# Patient Record
Sex: Female | Born: 1935
Health system: Southern US, Community
[De-identification: ages and names within clinical notes are randomized; demographics above are authoritative.]

## PROBLEM LIST (undated history)

## (undated) DIAGNOSIS — M1990 Unspecified osteoarthritis, unspecified site: Secondary | ICD-10-CM

## (undated) DIAGNOSIS — D649 Anemia, unspecified: Secondary | ICD-10-CM

## (undated) DIAGNOSIS — I10 Essential (primary) hypertension: Secondary | ICD-10-CM

## (undated) DIAGNOSIS — N186 End stage renal disease: Secondary | ICD-10-CM

## (undated) DIAGNOSIS — E785 Hyperlipidemia, unspecified: Secondary | ICD-10-CM

## (undated) DIAGNOSIS — J189 Pneumonia, unspecified organism: Secondary | ICD-10-CM

## (undated) DIAGNOSIS — Z992 Dependence on renal dialysis: Secondary | ICD-10-CM

## (undated) DIAGNOSIS — Z89512 Acquired absence of left leg below knee: Secondary | ICD-10-CM

## (undated) HISTORY — DX: Hyperlipidemia, unspecified: E78.5

## (undated) HISTORY — DX: Anemia, unspecified: D64.9

## (undated) HISTORY — PX: TONSILLECTOMY: SUR1361

## (undated) HISTORY — DX: Essential (primary) hypertension: I10

## (undated) HISTORY — PX: EYE SURGERY: SHX253

## (undated) HISTORY — PX: ARTERIOVENOUS GRAFT PLACEMENT: SUR1029

---

## 2001-01-13 ENCOUNTER — Emergency Department (HOSPITAL_COMMUNITY): Admission: EM | Admit: 2001-01-13 | Discharge: 2001-01-13 | Payer: Self-pay | Admitting: Emergency Medicine

## 2005-09-30 ENCOUNTER — Emergency Department (HOSPITAL_COMMUNITY): Admission: EM | Admit: 2005-09-30 | Discharge: 2005-09-30 | Payer: Self-pay | Admitting: Family Medicine

## 2006-06-14 ENCOUNTER — Emergency Department (HOSPITAL_COMMUNITY): Admission: EM | Admit: 2006-06-14 | Discharge: 2006-06-14 | Payer: Self-pay | Admitting: Emergency Medicine

## 2006-06-23 ENCOUNTER — Emergency Department (HOSPITAL_COMMUNITY): Admission: EM | Admit: 2006-06-23 | Discharge: 2006-06-23 | Payer: Self-pay | Admitting: Emergency Medicine

## 2007-02-13 ENCOUNTER — Emergency Department (HOSPITAL_COMMUNITY): Admission: EM | Admit: 2007-02-13 | Discharge: 2007-02-13 | Payer: Self-pay | Admitting: Emergency Medicine

## 2009-10-18 ENCOUNTER — Inpatient Hospital Stay (HOSPITAL_COMMUNITY): Admission: EM | Admit: 2009-10-18 | Discharge: 2009-10-21 | Payer: Self-pay | Admitting: Emergency Medicine

## 2009-11-03 ENCOUNTER — Inpatient Hospital Stay (HOSPITAL_COMMUNITY): Admission: EM | Admit: 2009-11-03 | Discharge: 2009-11-08 | Payer: Self-pay | Admitting: Emergency Medicine

## 2009-11-05 ENCOUNTER — Encounter (INDEPENDENT_AMBULATORY_CARE_PROVIDER_SITE_OTHER): Payer: Self-pay | Admitting: Internal Medicine

## 2009-11-07 ENCOUNTER — Encounter (INDEPENDENT_AMBULATORY_CARE_PROVIDER_SITE_OTHER): Payer: Self-pay | Admitting: Nephrology

## 2009-11-07 ENCOUNTER — Ambulatory Visit: Payer: Self-pay | Admitting: Vascular Surgery

## 2009-11-16 ENCOUNTER — Inpatient Hospital Stay (HOSPITAL_COMMUNITY): Admission: EM | Admit: 2009-11-16 | Discharge: 2009-11-18 | Payer: Self-pay | Admitting: Emergency Medicine

## 2009-12-19 ENCOUNTER — Ambulatory Visit: Payer: Self-pay | Admitting: Vascular Surgery

## 2010-01-28 ENCOUNTER — Ambulatory Visit: Payer: Self-pay | Admitting: Vascular Surgery

## 2010-01-28 ENCOUNTER — Ambulatory Visit (HOSPITAL_COMMUNITY): Admission: RE | Admit: 2010-01-28 | Discharge: 2010-01-28 | Payer: Self-pay | Admitting: Vascular Surgery

## 2010-04-04 ENCOUNTER — Encounter (HOSPITAL_COMMUNITY)
Admission: RE | Admit: 2010-04-04 | Discharge: 2010-07-03 | Payer: Self-pay | Source: Home / Self Care | Attending: Nephrology | Admitting: Nephrology

## 2010-05-21 ENCOUNTER — Inpatient Hospital Stay (HOSPITAL_COMMUNITY): Admission: EM | Admit: 2010-05-21 | Discharge: 2010-05-25 | Payer: Self-pay | Admitting: Emergency Medicine

## 2010-05-23 ENCOUNTER — Ambulatory Visit: Payer: Self-pay | Admitting: Vascular Surgery

## 2010-05-23 ENCOUNTER — Ambulatory Visit: Payer: Self-pay | Admitting: Infectious Diseases

## 2010-07-10 ENCOUNTER — Ambulatory Visit: Payer: Self-pay | Admitting: Vascular Surgery

## 2010-08-05 ENCOUNTER — Ambulatory Visit
Admission: RE | Admit: 2010-08-05 | Discharge: 2010-08-05 | Payer: Self-pay | Source: Home / Self Care | Attending: Vascular Surgery | Admitting: Vascular Surgery

## 2010-09-30 LAB — DIFFERENTIAL
Basophils Absolute: 0 10*3/uL (ref 0.0–0.1)
Basophils Relative: 0 % (ref 0–1)
Eosinophils Absolute: 0.1 10*3/uL (ref 0.0–0.7)
Eosinophils Relative: 1 % (ref 0–5)
Monocytes Absolute: 0.6 10*3/uL (ref 0.1–1.0)

## 2010-09-30 LAB — COMPREHENSIVE METABOLIC PANEL
ALT: 12 U/L (ref 0–35)
AST: 22 U/L (ref 0–37)
Albumin: 2.9 g/dL — ABNORMAL LOW (ref 3.5–5.2)
Alkaline Phosphatase: 84 U/L (ref 39–117)
CO2: 23 mEq/L (ref 19–32)
Chloride: 106 mEq/L (ref 96–112)
GFR calc Af Amer: 15 mL/min — ABNORMAL LOW (ref >60)
Potassium: 3.6 mEq/L (ref 3.5–5.1)
Total Bilirubin: 0.4 mg/dL (ref 0.3–1.2)

## 2010-09-30 LAB — CULTURE, BLOOD (ROUTINE X 2)
Culture  Setup Time: 201111021401
Culture  Setup Time: 201111030254
Culture  Setup Time: 201111030254
Culture: NO GROWTH
Culture: NO GROWTH

## 2010-09-30 LAB — GLUCOSE, CAPILLARY
Glucose-Capillary: 108 mg/dL — ABNORMAL HIGH (ref 70–99)
Glucose-Capillary: 150 mg/dL — ABNORMAL HIGH (ref 70–99)
Glucose-Capillary: 154 mg/dL — ABNORMAL HIGH (ref 70–99)
Glucose-Capillary: 159 mg/dL — ABNORMAL HIGH (ref 70–99)
Glucose-Capillary: 171 mg/dL — ABNORMAL HIGH (ref 70–99)
Glucose-Capillary: 198 mg/dL — ABNORMAL HIGH (ref 70–99)
Glucose-Capillary: 217 mg/dL — ABNORMAL HIGH (ref 70–99)

## 2010-09-30 LAB — BASIC METABOLIC PANEL
BUN: 51 mg/dL — ABNORMAL HIGH (ref 6–23)
BUN: 56 mg/dL — ABNORMAL HIGH (ref 6–23)
CO2: 26 mEq/L (ref 19–32)
Calcium: 8.7 mg/dL (ref 8.4–10.5)
Calcium: 8.9 mg/dL (ref 8.4–10.5)
Creatinine, Ser: 3.58 mg/dL — ABNORMAL HIGH (ref 0.4–1.2)
Creatinine, Ser: 3.78 mg/dL — ABNORMAL HIGH (ref 0.4–1.2)
GFR calc Af Amer: 15 mL/min — ABNORMAL LOW (ref >60)
GFR calc Af Amer: 15 mL/min — ABNORMAL LOW (ref >60)
GFR calc non Af Amer: 12 mL/min — ABNORMAL LOW (ref >60)
GFR calc non Af Amer: 12 mL/min — ABNORMAL LOW (ref >60)
GFR calc non Af Amer: 13 mL/min — ABNORMAL LOW (ref >60)
Glucose, Bld: 77 mg/dL (ref 70–99)
Glucose, Bld: 92 mg/dL (ref 70–99)
Potassium: 2.7 mEq/L — CL (ref 3.5–5.1)
Potassium: 3.3 mEq/L — ABNORMAL LOW (ref 3.5–5.1)
Sodium: 137 mEq/L (ref 135–145)
Sodium: 141 mEq/L (ref 135–145)

## 2010-09-30 LAB — QUANTIFERON TB GOLD ASSAY (BLOOD): Mitogen Minus Nil Value: 4.09 IU/mL

## 2010-09-30 LAB — CBC
HCT: 31.4 % — ABNORMAL LOW (ref 36.0–46.0)
HCT: 32.6 % — ABNORMAL LOW (ref 36.0–46.0)
Hemoglobin: 10.1 g/dL — ABNORMAL LOW (ref 12.0–15.0)
Hemoglobin: 10.6 g/dL — ABNORMAL LOW (ref 12.0–15.0)
Hemoglobin: 11.7 g/dL — ABNORMAL LOW (ref 12.0–15.0)
MCH: 27.9 pg (ref 26.0–34.0)
MCHC: 31.6 g/dL (ref 30.0–36.0)
MCHC: 32.5 g/dL (ref 30.0–36.0)
MCHC: 32.5 g/dL (ref 30.0–36.0)
Platelets: 205 10*3/uL (ref 150–400)
Platelets: 207 10*3/uL (ref 150–400)
Platelets: 230 10*3/uL (ref 150–400)
RBC: 3.83 MIL/uL — ABNORMAL LOW (ref 3.87–5.11)
RBC: 4.16 MIL/uL (ref 3.87–5.11)
RDW: 17.1 % — ABNORMAL HIGH (ref 11.5–15.5)
RDW: 17.4 % — ABNORMAL HIGH (ref 11.5–15.5)
WBC: 11.8 10*3/uL — ABNORMAL HIGH (ref 4.0–10.5)
WBC: 14 10*3/uL — ABNORMAL HIGH (ref 4.0–10.5)
WBC: 19.4 10*3/uL — ABNORMAL HIGH (ref 4.0–10.5)
WBC: 6.7 10*3/uL (ref 4.0–10.5)

## 2010-09-30 LAB — URINALYSIS, ROUTINE W REFLEX MICROSCOPIC
Glucose, UA: NEGATIVE mg/dL
Ketones, ur: NEGATIVE mg/dL
pH: 7.5 (ref 5.0–8.0)

## 2010-09-30 LAB — URINE MICROSCOPIC-ADD ON

## 2010-09-30 LAB — HEMOGLOBIN A1C: Hgb A1c MFr Bld: 6.4 % — ABNORMAL HIGH (ref <5.7)

## 2010-09-30 LAB — POCT CARDIAC MARKERS

## 2010-10-01 LAB — POCT HEMOGLOBIN-HEMACUE: Hemoglobin: 11.7 g/dL — ABNORMAL LOW (ref 12.0–15.0)

## 2010-10-01 LAB — IRON AND TIBC: UIBC: 180 ug/dL

## 2010-10-01 LAB — FERRITIN: Ferritin: 503 ng/mL — ABNORMAL HIGH (ref 10–291)

## 2010-10-05 LAB — GLUCOSE, CAPILLARY
Glucose-Capillary: 175 mg/dL — ABNORMAL HIGH (ref 70–99)
Glucose-Capillary: 189 mg/dL — ABNORMAL HIGH (ref 70–99)

## 2010-10-05 LAB — POCT I-STAT 4, (NA,K, GLUC, HGB,HCT)
Glucose, Bld: 177 mg/dL — ABNORMAL HIGH (ref 70–99)
HCT: 30 % — ABNORMAL LOW (ref 36.0–46.0)
Potassium: 3.8 mEq/L (ref 3.5–5.1)
Sodium: 141 mEq/L (ref 135–145)

## 2010-10-05 LAB — SURGICAL PCR SCREEN
MRSA, PCR: NEGATIVE
Staphylococcus aureus: NEGATIVE

## 2010-10-07 LAB — COMPREHENSIVE METABOLIC PANEL
ALT: 19 U/L (ref 0–35)
Albumin: 2.7 g/dL — ABNORMAL LOW (ref 3.5–5.2)
Albumin: 2.9 g/dL — ABNORMAL LOW (ref 3.5–5.2)
Albumin: 2.2 g/dL — ABNORMAL LOW (ref 3.5–5.2)
Alkaline Phosphatase: 69 U/L (ref 39–117)
BUN: 83 mg/dL — ABNORMAL HIGH (ref 6–23)
BUN: 101 mg/dL — ABNORMAL HIGH (ref 6–23)
BUN: 61 mg/dL — ABNORMAL HIGH (ref 6–23)
CO2: 26 mEq/L (ref 19–32)
Calcium: 10 mg/dL (ref 8.4–10.5)
Calcium: 7.8 mg/dL — ABNORMAL LOW (ref 8.4–10.5)
Chloride: 99 mEq/L (ref 96–112)
Creatinine, Ser: 3.6 mg/dL — ABNORMAL HIGH (ref 0.4–1.2)
Creatinine, Ser: 4.53 mg/dL — ABNORMAL HIGH (ref 0.4–1.2)
Glucose, Bld: 105 mg/dL — ABNORMAL HIGH (ref 70–99)
Glucose, Bld: 122 mg/dL — ABNORMAL HIGH (ref 70–99)
Potassium: 3.3 mEq/L — ABNORMAL LOW (ref 3.5–5.1)
Sodium: 137 mEq/L (ref 135–145)
Total Bilirubin: 0.8 mg/dL (ref 0.3–1.2)
Total Protein: 8.2 g/dL (ref 6.0–8.3)
Total Protein: 7 g/dL (ref 6.0–8.3)

## 2010-10-07 LAB — GLUCOSE, CAPILLARY
Glucose-Capillary: 165 mg/dL — ABNORMAL HIGH (ref 70–99)
Glucose-Capillary: 218 mg/dL — ABNORMAL HIGH (ref 70–99)
Glucose-Capillary: 246 mg/dL — ABNORMAL HIGH (ref 70–99)
Glucose-Capillary: 331 mg/dL — ABNORMAL HIGH (ref 70–99)
Glucose-Capillary: 142 mg/dL — ABNORMAL HIGH (ref 70–99)
Glucose-Capillary: 190 mg/dL — ABNORMAL HIGH (ref 70–99)
Glucose-Capillary: 103 mg/dL — ABNORMAL HIGH (ref 70–99)
Glucose-Capillary: 105 mg/dL — ABNORMAL HIGH (ref 70–99)
Glucose-Capillary: 115 mg/dL — ABNORMAL HIGH (ref 70–99)
Glucose-Capillary: 140 mg/dL — ABNORMAL HIGH (ref 70–99)
Glucose-Capillary: 168 mg/dL — ABNORMAL HIGH (ref 70–99)
Glucose-Capillary: 175 mg/dL — ABNORMAL HIGH (ref 70–99)
Glucose-Capillary: 206 mg/dL — ABNORMAL HIGH (ref 70–99)
Glucose-Capillary: 218 mg/dL — ABNORMAL HIGH (ref 70–99)
Glucose-Capillary: 219 mg/dL — ABNORMAL HIGH (ref 70–99)
Glucose-Capillary: 253 mg/dL — ABNORMAL HIGH (ref 70–99)
Glucose-Capillary: 281 mg/dL — ABNORMAL HIGH (ref 70–99)
Glucose-Capillary: 286 mg/dL — ABNORMAL HIGH (ref 70–99)

## 2010-10-07 LAB — CARDIAC PANEL(CRET KIN+CKTOT+MB+TROPI)
CK, MB: 1.2 ng/mL (ref 0.3–4.0)
CK, MB: 1.1 ng/mL (ref 0.3–4.0)
CK, MB: 0.8 ng/mL (ref 0.3–4.0)
CK, MB: 1.3 ng/mL (ref 0.3–4.0)
Relative Index: INVALID (ref 0.0–2.5)
Total CK: 127 U/L (ref 7–177)
Troponin I: 0.06 ng/mL (ref 0.00–0.06)
Troponin I: 0.08 ng/mL — ABNORMAL HIGH (ref 0.00–0.06)
Troponin I: 0.09 ng/mL — ABNORMAL HIGH (ref 0.00–0.06)

## 2010-10-07 LAB — URINALYSIS, ROUTINE W REFLEX MICROSCOPIC
Bilirubin Urine: NEGATIVE
Bilirubin Urine: NEGATIVE
Ketones, ur: NEGATIVE mg/dL
Nitrite: NEGATIVE
Specific Gravity, Urine: 1.011 (ref 1.005–1.030)
Urobilinogen, UA: 0.2 mg/dL (ref 0.0–1.0)
pH: 5.5 (ref 5.0–8.0)

## 2010-10-07 LAB — BASIC METABOLIC PANEL
BUN: 54 mg/dL — ABNORMAL HIGH (ref 6–23)
BUN: 62 mg/dL — ABNORMAL HIGH (ref 6–23)
CO2: 21 mEq/L (ref 19–32)
Calcium: 7.9 mg/dL — ABNORMAL LOW (ref 8.4–10.5)
Calcium: 8.1 mg/dL — ABNORMAL LOW (ref 8.4–10.5)
Creatinine, Ser: 3.35 mg/dL — ABNORMAL HIGH (ref 0.4–1.2)
Creatinine, Ser: 3.44 mg/dL — ABNORMAL HIGH (ref 0.4–1.2)
GFR calc Af Amer: 16 mL/min — ABNORMAL LOW (ref >60)
GFR calc non Af Amer: 13 mL/min — ABNORMAL LOW (ref >60)
GFR calc non Af Amer: 13 mL/min — ABNORMAL LOW (ref >60)
GFR calc non Af Amer: 14 mL/min — ABNORMAL LOW (ref >60)
Glucose, Bld: 129 mg/dL — ABNORMAL HIGH (ref 70–99)
Glucose, Bld: 244 mg/dL — ABNORMAL HIGH (ref 70–99)
Sodium: 138 mEq/L (ref 135–145)

## 2010-10-07 LAB — CBC
HCT: 31.2 % — ABNORMAL LOW (ref 36.0–46.0)
HCT: 33.2 % — ABNORMAL LOW (ref 36.0–46.0)
HCT: 22.6 % — ABNORMAL LOW (ref 36.0–46.0)
HCT: 22.6 % — ABNORMAL LOW (ref 36.0–46.0)
HCT: 24.5 % — ABNORMAL LOW (ref 36.0–46.0)
Hemoglobin: 10.5 g/dL — ABNORMAL LOW (ref 12.0–15.0)
Hemoglobin: 6.4 g/dL — CL (ref 12.0–15.0)
Hemoglobin: 7.1 g/dL — ABNORMAL LOW (ref 12.0–15.0)
Hemoglobin: 7.2 g/dL — ABNORMAL LOW (ref 12.0–15.0)
Hemoglobin: 7.3 g/dL — ABNORMAL LOW (ref 12.0–15.0)
Hemoglobin: 7.4 g/dL — ABNORMAL LOW (ref 12.0–15.0)
Hemoglobin: 7.7 g/dL — ABNORMAL LOW (ref 12.0–15.0)
Hemoglobin: 8.1 g/dL — ABNORMAL LOW (ref 12.0–15.0)
Hemoglobin: 8.3 g/dL — ABNORMAL LOW (ref 12.0–15.0)
MCHC: 34.1 g/dL (ref 30.0–36.0)
MCHC: 32.3 g/dL (ref 30.0–36.0)
MCHC: 32.6 g/dL (ref 30.0–36.0)
MCHC: 32.6 g/dL (ref 30.0–36.0)
MCHC: 32.8 g/dL (ref 30.0–36.0)
MCHC: 33 g/dL (ref 30.0–36.0)
MCV: 88.3 fL (ref 78.0–100.0)
MCV: 87.5 fL (ref 78.0–100.0)
MCV: 88.1 fL (ref 78.0–100.0)
MCV: 88.1 fL (ref 78.0–100.0)
MCV: 88.3 fL (ref 78.0–100.0)
MCV: 88.3 fL (ref 78.0–100.0)
MCV: 88.4 fL (ref 78.0–100.0)
Platelets: 255 10*3/uL (ref 150–400)
Platelets: 217 10*3/uL (ref 150–400)
Platelets: 225 10*3/uL (ref 150–400)
Platelets: 236 10*3/uL (ref 150–400)
Platelets: 261 10*3/uL (ref 150–400)
RBC: 3.53 MIL/uL — ABNORMAL LOW (ref 3.87–5.11)
RBC: 2.23 MIL/uL — ABNORMAL LOW (ref 3.87–5.11)
RBC: 2.49 MIL/uL — ABNORMAL LOW (ref 3.87–5.11)
RBC: 2.55 MIL/uL — ABNORMAL LOW (ref 3.87–5.11)
RBC: 2.66 MIL/uL — ABNORMAL LOW (ref 3.87–5.11)
RBC: 2.78 MIL/uL — ABNORMAL LOW (ref 3.87–5.11)
RBC: 2.85 MIL/uL — ABNORMAL LOW (ref 3.87–5.11)
RBC: 3.31 MIL/uL — ABNORMAL LOW (ref 3.87–5.11)
RDW: 16.3 % — ABNORMAL HIGH (ref 11.5–15.5)
RDW: 14.5 % (ref 11.5–15.5)
RDW: 15 % (ref 11.5–15.5)
RDW: 15.3 % (ref 11.5–15.5)
RDW: 15.3 % (ref 11.5–15.5)
RDW: 15.6 % — ABNORMAL HIGH (ref 11.5–15.5)
WBC: 5.9 10*3/uL (ref 4.0–10.5)
WBC: 6.5 10*3/uL (ref 4.0–10.5)
WBC: 6.6 10*3/uL (ref 4.0–10.5)
WBC: 7 10*3/uL (ref 4.0–10.5)
WBC: 7.2 10*3/uL (ref 4.0–10.5)
WBC: 7.5 10*3/uL (ref 4.0–10.5)
WBC: 7.6 10*3/uL (ref 4.0–10.5)
WBC: 8.3 10*3/uL (ref 4.0–10.5)
WBC: 9.2 10*3/uL (ref 4.0–10.5)

## 2010-10-07 LAB — CROSSMATCH

## 2010-10-07 LAB — DIFFERENTIAL
Basophils Absolute: 0 10*3/uL (ref 0.0–0.1)
Basophils Relative: 0 % (ref 0–1)
Eosinophils Absolute: 0.1 10*3/uL (ref 0.0–0.7)
Eosinophils Absolute: 0.1 10*3/uL (ref 0.0–0.7)
Lymphocytes Relative: 22 % (ref 12–46)
Lymphs Abs: 1.8 10*3/uL (ref 0.7–4.0)
Monocytes Absolute: 1 10*3/uL (ref 0.1–1.0)
Monocytes Absolute: 0.8 10*3/uL (ref 0.1–1.0)
Monocytes Relative: 13 % — ABNORMAL HIGH (ref 3–12)
Monocytes Relative: 15 % — ABNORMAL HIGH (ref 3–12)
Neutro Abs: 4.2 10*3/uL (ref 1.7–7.7)
Neutro Abs: 6.6 10*3/uL (ref 1.7–7.7)
Neutrophils Relative %: 55 % (ref 43–77)
Neutrophils Relative %: 76 % (ref 43–77)

## 2010-10-07 LAB — URINE CULTURE: Colony Count: NO GROWTH

## 2010-10-07 LAB — LIPID PANEL
Cholesterol: 236 mg/dL — ABNORMAL HIGH (ref 0–200)
HDL: 21 mg/dL — ABNORMAL LOW (ref >39)
HDL: 16 mg/dL — ABNORMAL LOW (ref >39)
Total CHOL/HDL Ratio: 9.6 RATIO
Triglycerides: 105 mg/dL (ref <150)

## 2010-10-07 LAB — UIFE/LIGHT CHAINS/TP QN, 24-HR UR
Albumin, U: DETECTED
Alpha 1, Urine: DETECTED — AB
Free Lambda Lt Chains,Ur: 6.37 mg/dL — ABNORMAL HIGH (ref 0.08–1.01)
Gamma Globulin, Urine: DETECTED — AB
Total Protein, Urine: 114.7 mg/dL

## 2010-10-07 LAB — CULTURE, BLOOD (ROUTINE X 2)

## 2010-10-07 LAB — IMMUNOFIXATION ADD-ON

## 2010-10-07 LAB — POCT I-STAT, CHEM 8
Calcium, Ion: 1.1 mmol/L — ABNORMAL LOW (ref 1.12–1.32)
Chloride: 112 mEq/L (ref 96–112)
Creatinine, Ser: 3.8 mg/dL — ABNORMAL HIGH (ref 0.4–1.2)
Glucose, Bld: 168 mg/dL — ABNORMAL HIGH (ref 70–99)
HCT: 24 % — ABNORMAL LOW (ref 36.0–46.0)

## 2010-10-07 LAB — PROTEIN ELECTROPH W RFLX QUANT IMMUNOGLOBULINS
Alpha-1-Globulin: 8.6 % — ABNORMAL HIGH (ref 2.9–4.9)
Alpha-2-Globulin: 16.5 % — ABNORMAL HIGH (ref 7.1–11.8)
Beta 2: 7.4 % — ABNORMAL HIGH (ref 3.2–6.5)
Beta Globulin: 4.9 % (ref 4.7–7.2)
Gamma Globulin: 25.7 % — ABNORMAL HIGH (ref 11.1–18.8)
M-Spike, %: NOT DETECTED g/dL

## 2010-10-07 LAB — RENAL FUNCTION PANEL
Albumin: 2 g/dL — ABNORMAL LOW (ref 3.5–5.2)
CO2: 28 mEq/L (ref 19–32)
Calcium: 8 mg/dL — ABNORMAL LOW (ref 8.4–10.5)
Calcium: 8.4 mg/dL (ref 8.4–10.5)
Chloride: 104 mEq/L (ref 96–112)
Chloride: 106 mEq/L (ref 96–112)
Creatinine, Ser: 3.42 mg/dL — ABNORMAL HIGH (ref 0.4–1.2)
GFR calc Af Amer: 16 mL/min — ABNORMAL LOW (ref >60)
GFR calc non Af Amer: 13 mL/min — ABNORMAL LOW (ref >60)
Glucose, Bld: 108 mg/dL — ABNORMAL HIGH (ref 70–99)
Glucose, Bld: 142 mg/dL — ABNORMAL HIGH (ref 70–99)
Phosphorus: 5.2 mg/dL — ABNORMAL HIGH (ref 2.3–4.6)
Potassium: 4.1 mEq/L (ref 3.5–5.1)
Sodium: 137 mEq/L (ref 135–145)
Sodium: 140 mEq/L (ref 135–145)

## 2010-10-07 LAB — URINE MICROSCOPIC-ADD ON

## 2010-10-07 LAB — TROPONIN I
Troponin I: 0.04 ng/mL (ref 0.00–0.06)
Troponin I: 0.09 ng/mL — ABNORMAL HIGH (ref 0.00–0.06)

## 2010-10-07 LAB — OCCULT BLOOD (STOOL CUP TO LAB): Fecal Occult Bld: NEGATIVE

## 2010-10-07 LAB — POCT CARDIAC MARKERS
CKMB, poc: <1 ng/mL — ABNORMAL LOW (ref 1.0–8.0)
CKMB, poc: 1.8 ng/mL (ref 1.0–8.0)
Myoglobin, poc: 236 ng/mL (ref 12–200)
Troponin i, poc: <0.05 ng/mL (ref 0.00–0.09)

## 2010-10-07 LAB — HEMOGLOBIN A1C
Hgb A1c MFr Bld: 7.3 % — ABNORMAL HIGH (ref <5.7)
Mean Plasma Glucose: 163 mg/dL — ABNORMAL HIGH (ref <117)
Mean Plasma Glucose: 183 mg/dL — ABNORMAL HIGH (ref <117)

## 2010-10-07 LAB — PTH, INTACT AND CALCIUM: PTH: 275.7 pg/mL — ABNORMAL HIGH (ref 14.0–72.0)

## 2010-10-07 LAB — CK TOTAL AND CKMB (NOT AT ARMC)
CK, MB: 1.1 ng/mL (ref 0.3–4.0)
Total CK: 123 U/L (ref 7–177)

## 2010-10-07 LAB — IGG, IGA, IGM
IgA: 268 mg/dL (ref 68–378)
IgG (Immunoglobin G), Serum: 1820 mg/dL — ABNORMAL HIGH (ref 694–1618)

## 2010-10-07 LAB — ABO/RH: ABO/RH(D): A POS

## 2010-10-07 LAB — PREPARE RBC (CROSSMATCH)

## 2010-10-07 LAB — T4: T4, Total: 6.8 ug/dL (ref 5.0–12.5)

## 2010-10-07 LAB — PHOSPHORUS: Phosphorus: 5.2 mg/dL — ABNORMAL HIGH (ref 2.3–4.6)

## 2010-10-08 LAB — GLUCOSE, CAPILLARY
Glucose-Capillary: 102 mg/dL — ABNORMAL HIGH (ref 70–99)
Glucose-Capillary: 107 mg/dL — ABNORMAL HIGH (ref 70–99)
Glucose-Capillary: 109 mg/dL — ABNORMAL HIGH (ref 70–99)
Glucose-Capillary: 132 mg/dL — ABNORMAL HIGH (ref 70–99)
Glucose-Capillary: 139 mg/dL — ABNORMAL HIGH (ref 70–99)
Glucose-Capillary: 139 mg/dL — ABNORMAL HIGH (ref 70–99)
Glucose-Capillary: 86 mg/dL (ref 70–99)

## 2010-10-08 LAB — CBC
HCT: 27.7 % — ABNORMAL LOW (ref 36.0–46.0)
Hemoglobin: 8.6 g/dL — ABNORMAL LOW (ref 12.0–15.0)
MCHC: 33.1 g/dL (ref 30.0–36.0)
MCV: 87.8 fL (ref 78.0–100.0)
MCV: 88.3 fL (ref 78.0–100.0)
Platelets: 183 10*3/uL (ref 150–400)
Platelets: 186 10*3/uL (ref 150–400)
Platelets: 226 10*3/uL (ref 150–400)
RDW: 14.7 % (ref 11.5–15.5)
RDW: 14.8 % (ref 11.5–15.5)
RDW: 15.1 % (ref 11.5–15.5)
RDW: 15.3 % (ref 11.5–15.5)
WBC: 6.5 10*3/uL (ref 4.0–10.5)

## 2010-10-08 LAB — BASIC METABOLIC PANEL
BUN: 28 mg/dL — ABNORMAL HIGH (ref 6–23)
BUN: 39 mg/dL — ABNORMAL HIGH (ref 6–23)
CO2: 22 mEq/L (ref 19–32)
CO2: 23 mEq/L (ref 19–32)
Calcium: 7.8 mg/dL — ABNORMAL LOW (ref 8.4–10.5)
Calcium: 7.9 mg/dL — ABNORMAL LOW (ref 8.4–10.5)
Chloride: 109 mEq/L (ref 96–112)
Chloride: 112 mEq/L (ref 96–112)
Creatinine, Ser: 2.57 mg/dL — ABNORMAL HIGH (ref 0.4–1.2)
Creatinine, Ser: 2.78 mg/dL — ABNORMAL HIGH (ref 0.4–1.2)
GFR calc non Af Amer: 18 mL/min — ABNORMAL LOW (ref >60)
Glucose, Bld: 126 mg/dL — ABNORMAL HIGH (ref 70–99)
Glucose, Bld: 131 mg/dL — ABNORMAL HIGH (ref 70–99)
Glucose, Bld: 99 mg/dL (ref 70–99)
Potassium: 3.3 mEq/L — ABNORMAL LOW (ref 3.5–5.1)
Sodium: 138 mEq/L (ref 135–145)

## 2010-10-08 LAB — URINALYSIS, ROUTINE W REFLEX MICROSCOPIC
Glucose, UA: NEGATIVE mg/dL
pH: 6.5 (ref 5.0–8.0)

## 2010-10-08 LAB — COMPREHENSIVE METABOLIC PANEL
ALT: 13 U/L (ref 0–35)
Albumin: 3.2 g/dL — ABNORMAL LOW (ref 3.5–5.2)
Alkaline Phosphatase: 83 U/L (ref 39–117)
BUN: 34 mg/dL — ABNORMAL HIGH (ref 6–23)
GFR calc Af Amer: 21 mL/min — ABNORMAL LOW (ref >60)
Potassium: 3.4 mEq/L — ABNORMAL LOW (ref 3.5–5.1)
Sodium: 139 mEq/L (ref 135–145)
Total Protein: 8.1 g/dL (ref 6.0–8.3)

## 2010-10-08 LAB — POCT CARDIAC MARKERS
CKMB, poc: 2.6 ng/mL (ref 1.0–8.0)
Myoglobin, poc: 265 ng/mL (ref 12–200)
Myoglobin, poc: 357 ng/mL (ref 12–200)

## 2010-10-08 LAB — CK TOTAL AND CKMB (NOT AT ARMC)
CK, MB: 2.4 ng/mL (ref 0.3–4.0)
Relative Index: 2.1 (ref 0.0–2.5)

## 2010-10-08 LAB — URINE MICROSCOPIC-ADD ON

## 2010-10-08 LAB — URINE CULTURE

## 2010-10-08 LAB — VITAMIN B12: Vitamin B-12: 432 pg/mL (ref 211–911)

## 2010-10-08 LAB — IRON AND TIBC
Iron: 52 ug/dL (ref 42–135)
TIBC: 242 ug/dL — ABNORMAL LOW (ref 250–470)

## 2010-10-08 LAB — RETICULOCYTES
RBC.: 3.47 MIL/uL — ABNORMAL LOW (ref 3.87–5.11)
Retic Count, Absolute: 65.9 10*3/uL (ref 19.0–186.0)

## 2010-10-08 LAB — DIFFERENTIAL
Basophils Relative: 0 % (ref 0–1)
Monocytes Absolute: 0.6 10*3/uL (ref 0.1–1.0)
Monocytes Relative: 9 % (ref 3–12)
Neutro Abs: 4.7 10*3/uL (ref 1.7–7.7)

## 2010-10-08 LAB — FERRITIN: Ferritin: 128 ng/mL (ref 10–291)

## 2010-10-10 ENCOUNTER — Encounter (HOSPITAL_COMMUNITY): Payer: Medicare Other | Attending: Nephrology

## 2010-10-10 ENCOUNTER — Other Ambulatory Visit: Payer: Self-pay | Admitting: Nephrology

## 2010-10-10 DIAGNOSIS — N184 Chronic kidney disease, stage 4 (severe): Secondary | ICD-10-CM | POA: Insufficient documentation

## 2010-10-10 DIAGNOSIS — D638 Anemia in other chronic diseases classified elsewhere: Secondary | ICD-10-CM | POA: Insufficient documentation

## 2010-10-13 LAB — POCT HEMOGLOBIN-HEMACUE: Hemoglobin: 8.7 g/dL — ABNORMAL LOW (ref 12.0–15.0)

## 2010-10-24 ENCOUNTER — Encounter (HOSPITAL_COMMUNITY)
Admission: RE | Admit: 2010-10-24 | Discharge: 2010-10-24 | Disposition: A | Discharge status: Home / Self Care | Payer: Medicare Other | Source: Ambulatory Visit | Attending: Nephrology | Admitting: Nephrology

## 2010-10-24 ENCOUNTER — Other Ambulatory Visit: Payer: Self-pay | Admitting: Nephrology

## 2010-10-24 DIAGNOSIS — D638 Anemia in other chronic diseases classified elsewhere: Secondary | ICD-10-CM | POA: Insufficient documentation

## 2010-10-24 DIAGNOSIS — N184 Chronic kidney disease, stage 4 (severe): Secondary | ICD-10-CM | POA: Insufficient documentation

## 2010-10-24 LAB — IRON AND TIBC: TIBC: 214 ug/dL — ABNORMAL LOW (ref 250–470)

## 2010-11-07 ENCOUNTER — Encounter (HOSPITAL_COMMUNITY): Payer: Medicare Other

## 2010-11-07 ENCOUNTER — Other Ambulatory Visit: Payer: Self-pay | Admitting: Nephrology

## 2010-11-07 LAB — POCT HEMOGLOBIN-HEMACUE: Hemoglobin: 11.3 g/dL — ABNORMAL LOW (ref 12.0–15.0)

## 2010-11-21 ENCOUNTER — Other Ambulatory Visit: Payer: Self-pay | Admitting: Nephrology

## 2010-11-21 ENCOUNTER — Encounter (HOSPITAL_COMMUNITY): Payer: Medicare Other | Attending: Nephrology

## 2010-11-21 DIAGNOSIS — N184 Chronic kidney disease, stage 4 (severe): Secondary | ICD-10-CM | POA: Insufficient documentation

## 2010-11-21 DIAGNOSIS — D638 Anemia in other chronic diseases classified elsewhere: Secondary | ICD-10-CM | POA: Insufficient documentation

## 2010-11-21 LAB — IRON AND TIBC
Iron: 41 ug/dL — ABNORMAL LOW (ref 42–135)
TIBC: 193 ug/dL — ABNORMAL LOW (ref 250–470)
UIBC: 152 ug/dL

## 2010-11-24 LAB — POCT HEMOGLOBIN-HEMACUE: Hemoglobin: 12 g/dL (ref 12.0–15.0)

## 2010-12-02 NOTE — Assessment & Plan Note (Signed)
OFFICE VISIT   Jackie [name], Jackie [name]  DOB:  08/10/1934                                       08/05/2010  ONGEX#:[id number]   Patient presents today for discussion regarding AV access.  She had had  initial placement of her left upper arm AV Gore-Tex graft by Dr. Cari Caraway on 01/28/10.  She had had no early complications.  She was  admitted to [facility] in mid November for pneumonia and was  found to have an occluded graft.  She had been a no-show at our office  in December for her follow-up and presents today for continued  discussion.  She is here today with her son.   Her most recent laboratory data that I have shows a creatinine of 3.5,  and she reports that her kidney function is relatively stable.  I do not  have any documentation of this.   I discussed options with patient and her son.  I explained that there is  no way to thrombectomize this graft since we know that it has been  occluded for at least 2 months.  I did explain the option of a new left  arm graft placement.  She does have a moderate-sized cephalic vein at  the antecubital space on the right.  Her vein map showed that she had a  moderate-sized cephalic vein in the right upper arm.  I have recommended  against a new AV graft placement since she is now 6 months out from her  initial placement and is still not on hemodialysis but apparently has  stable renal function.  I would recommend new left upper arm graft  placement when she approaches the need for hemodialysis access.  She  understands this and will see [place] again when she is closer to needing  access for a new left upper arm AV graft.     Larina Earthly, M.D.  Electronically Signed   TFE/MEDQ  D:  08/05/2010  T:  08/06/2010  Job:  [id number]   cc:   Fayrene Fearing L. Deterding, M.D.

## 2010-12-02 NOTE — Procedures (Signed)
CEPHALIC VEIN MAPPING   INDICATION:  Stage 4 renal disease/preop AVG placement.   HISTORY:   EXAM:  The right cephalic vein is compressible.   Diameter measurements range from 0.30 to 0.60 cm.   The right basilic vein is compressible with diameter measurements range  from 0.25 to 0.35 cm.   The left cephalic vein is compressible.   Diameter measurements range from 0.20 to 0.46 cm.   The left basilic vein was not visualized.   See attached worksheet for all measurements.   IMPRESSION:  Patient's bilateral cephalic and right basilic veins are  noted with the diameters as described above.   ___________________________________________  Di Kindle. Edilia Bo, M.D.   CB/MEDQ  D:  12/19/2009  T:  12/19/2009  Job:  [id number]

## 2010-12-02 NOTE — Assessment & Plan Note (Signed)
OFFICE VISIT   Jackie [name], Jackie [name]  DOB:  11-06-1934                                       07/10/2010  GNFAO#:[id number]   The patient was scheduled for an office visit for consideration of  placement of a new hemodialysis access.  She was a no show for her  office visit.     Janetta Hora. Fields, MD  Electronically Signed   CEF/MEDQ  D:  07/10/2010  T:  07/11/2010  Job:  [id number]   cc:   Fayrene Fearing L. Deterding, M.D.

## 2010-12-02 NOTE — Consult Note (Signed)
NEW PATIENT CONSULTATION   Jackie [name], Jackie [name]  DOB:  17-Mar-1935                                       12/19/2009  ZOXWR#:[id number]   I saw the patient in the office today in consultation for hemodialysis  access.  She was referred by Dr. Darrick Penna.  This is a pleasant 75-year-  old woman who is right-handed who has end-stage renal disease, I believe  secondary to hypertension and diabetes.  It is felt that she will likely  need dialysis in the near future and we were asked to provide  hemodialysis access.  Of note initially she had presented with some  nausea, but this has subsequently resolved.  She has had no recent  uremic symptoms.  Specifically she denies any fatigue, anorexia, change  in mental status, nausea, vomiting or palpitations or shortness of  breath.   PAST MEDICAL HISTORY:  Significant for adult onset diabetes.  In  addition she has hypertension.  She denies any history of previous  myocardial infarction, history of congestive heart failure, history of  COPD.  Of note she did have an echo during one of her hospitalizations  which showed some mild mitral regurgitation.   FAMILY HISTORY:  She is unaware of any history of premature  cardiovascular disease.   SOCIAL HISTORY:  She is widowed.  She does not smoke cigarettes.  She  does not drink alcohol on a regular basis.   REVIEW OF SYSTEMS:  GENERAL:  She has had no recent weight loss, weight  gain or problems in her appetite.  CARDIOVASCULAR:  She has had no chest pain, chest pressure, palpitations  or arrhythmias.  She has had no claudication, rest pain or nonhealing  ulcers.  She has had a history of stroke, TIAs or amaurosis fugax.  She  has had no history of DVT.  GU:  She has had no dysuria or frequency.  ENT:  She has had some change in her eyesight recently.  PULMONARY, GI, NEUROLOGIC, MUSCULOSKELETAL, PSYCHIATRIC, HEMATOLOGIC  review of systems is unremarkable and is documented on the  medical  history form in her chart.   PHYSICAL EXAMINATION:  This is a pleasant 76 year old woman who appears  her stated age.  Blood pressure 118/68, heart rate is 67, saturation  99%.  HEENT:  Unremarkable.  Lungs are clear bilaterally to auscultation  without rales or wheezing.  Cardiovascular exam I do not detect any  carotid bruits.  She has an irregular rate and rhythm.  She has palpable  brachial, femoral pulses bilaterally.  Both feet are warm and well-  perfused without ischemic ulcers.  Abdomen:  Soft and nontender with  normal pitched bowel sounds.  Musculoskeletal examination; she has no  major deformities or cyanosis.  Neurologic:  Exam she has no focal  weakness or paresthesias.  Skin:  There are no ulcers or rashes.   I independently interpreted her vein mapping today which shows a fairly  small forearm cephalic vein with a slightly larger upper arm cephalic  vein on the left side.  Basilic vein could not be visualized.  She had a  reasonable sized forearm and upper arm cephalic vein on the right side  and a reasonable size basilic vein on the right side.   LABORATORY EVALUATION:  From Nov 21, 2009 shows a hemoglobin of 10.7.  Creatinine was 2.6.  I have recommended that we explore her forearm cephalic vein on the left  side and if this is adequate place a radiocephalic fistula.  If this is  not adequate we would explore upper arm cephalic vein for possible  brachial cephalic fistula.  If neither were adequate she would likely  require a graft in the left arm.  However I think she has a good chance  of getting a fistula.  We have discussed the indications for surgery and  potential complications including, but not limited to failure of the  fistula to mature, graft thrombosis, graft infection, steal syndrome,  wound healing problems, bleeding or arm swelling.  All of her questions  were answered.  Currently she would like to discuss this again with Dr.  Darrick Penna  before scheduling surgery.  Once she is agreeable to proceed  we will be happy to schedule for a left AV fistula.     Di Kindle. Edilia Bo, M.D.  Electronically Signed   CSD/MEDQ  D:  12/19/2009  T:  12/20/2009  Job:  [id number]

## 2010-12-05 ENCOUNTER — Encounter (HOSPITAL_COMMUNITY): Payer: Medicare Other

## 2010-12-05 ENCOUNTER — Other Ambulatory Visit: Payer: Self-pay | Admitting: Nephrology

## 2010-12-19 ENCOUNTER — Other Ambulatory Visit: Payer: Self-pay | Admitting: Nephrology

## 2010-12-19 ENCOUNTER — Encounter (HOSPITAL_COMMUNITY): Payer: Medicare Other | Attending: Nephrology

## 2010-12-19 DIAGNOSIS — N184 Chronic kidney disease, stage 4 (severe): Secondary | ICD-10-CM | POA: Insufficient documentation

## 2010-12-19 DIAGNOSIS — D638 Anemia in other chronic diseases classified elsewhere: Secondary | ICD-10-CM | POA: Insufficient documentation

## 2010-12-19 LAB — IRON AND TIBC
Saturation Ratios: 22 % (ref 20–55)
TIBC: 196 ug/dL — ABNORMAL LOW (ref 250–470)

## 2010-12-22 ENCOUNTER — Encounter (HOSPITAL_COMMUNITY): Payer: Medicare Other

## 2010-12-22 LAB — POCT HEMOGLOBIN-HEMACUE: Hemoglobin: 11.5 g/dL — ABNORMAL LOW (ref 12.0–15.0)

## 2011-01-02 ENCOUNTER — Encounter (HOSPITAL_COMMUNITY)
Admission: RE | Admit: 2011-01-02 | Discharge: 2011-01-02 | Payer: Medicare Other | Source: Ambulatory Visit | Attending: Nephrology | Admitting: Nephrology

## 2011-01-02 ENCOUNTER — Other Ambulatory Visit: Payer: Self-pay | Admitting: Nephrology

## 2011-01-02 ENCOUNTER — Encounter (HOSPITAL_COMMUNITY): Payer: Medicare Other

## 2011-01-06 LAB — POCT HEMOGLOBIN-HEMACUE: Hemoglobin: 11.4 g/dL — ABNORMAL LOW (ref 12.0–15.0)

## 2011-01-16 ENCOUNTER — Encounter (HOSPITAL_COMMUNITY): Payer: Medicare Other

## 2011-01-16 ENCOUNTER — Other Ambulatory Visit: Payer: Self-pay | Admitting: Nephrology

## 2011-01-16 LAB — POCT HEMOGLOBIN-HEMACUE: Hemoglobin: 11.9 g/dL — ABNORMAL LOW (ref 12.0–15.0)

## 2011-01-30 ENCOUNTER — Other Ambulatory Visit: Payer: Self-pay | Admitting: Nephrology

## 2011-01-30 ENCOUNTER — Encounter (HOSPITAL_COMMUNITY): Payer: Medicare Other | Attending: Nephrology

## 2011-01-30 DIAGNOSIS — N184 Chronic kidney disease, stage 4 (severe): Secondary | ICD-10-CM | POA: Insufficient documentation

## 2011-01-30 DIAGNOSIS — D638 Anemia in other chronic diseases classified elsewhere: Secondary | ICD-10-CM | POA: Insufficient documentation

## 2011-01-30 LAB — RENAL FUNCTION PANEL
Albumin: 3 g/dL — ABNORMAL LOW (ref 3.5–5.2)
BUN: 65 mg/dL — ABNORMAL HIGH (ref 6–23)
Chloride: 103 mEq/L (ref 96–112)
Creatinine, Ser: 4.39 mg/dL — ABNORMAL HIGH (ref 0.50–1.10)
GFR calc non Af Amer: 10 mL/min — ABNORMAL LOW (ref >60)
Phosphorus: 6.3 mg/dL — ABNORMAL HIGH (ref 2.3–4.6)
Potassium: 4.1 mEq/L (ref 3.5–5.1)

## 2011-01-30 LAB — IRON AND TIBC: UIBC: 138 ug/dL

## 2011-02-13 ENCOUNTER — Encounter (HOSPITAL_COMMUNITY): Payer: Medicare Other

## 2011-02-13 ENCOUNTER — Other Ambulatory Visit: Payer: Self-pay | Admitting: Nephrology

## 2011-02-13 LAB — POCT HEMOGLOBIN-HEMACUE: Hemoglobin: 11.9 g/dL — ABNORMAL LOW (ref 12.0–15.0)

## 2011-02-27 ENCOUNTER — Other Ambulatory Visit: Payer: Self-pay | Admitting: Nephrology

## 2011-02-27 ENCOUNTER — Encounter (HOSPITAL_COMMUNITY): Payer: Medicare Other | Attending: Nephrology

## 2011-02-27 DIAGNOSIS — D638 Anemia in other chronic diseases classified elsewhere: Secondary | ICD-10-CM | POA: Insufficient documentation

## 2011-02-27 DIAGNOSIS — N184 Chronic kidney disease, stage 4 (severe): Secondary | ICD-10-CM | POA: Insufficient documentation

## 2011-02-27 LAB — IRON AND TIBC
Iron: 67 ug/dL (ref 42–135)
TIBC: 208 ug/dL — ABNORMAL LOW (ref 250–470)

## 2011-03-13 ENCOUNTER — Encounter (HOSPITAL_COMMUNITY): Payer: Medicare Other

## 2011-03-13 ENCOUNTER — Other Ambulatory Visit: Payer: Self-pay | Admitting: Nephrology

## 2011-03-27 ENCOUNTER — Other Ambulatory Visit: Payer: Self-pay | Admitting: Nephrology

## 2011-03-27 ENCOUNTER — Encounter (HOSPITAL_COMMUNITY)
Admission: RE | Admit: 2011-03-27 | Discharge: 2011-03-27 | Disposition: A | Discharge status: Home / Self Care | Payer: Medicare Other | Source: Ambulatory Visit | Attending: Nephrology | Admitting: Nephrology

## 2011-03-27 DIAGNOSIS — D638 Anemia in other chronic diseases classified elsewhere: Secondary | ICD-10-CM | POA: Insufficient documentation

## 2011-03-27 DIAGNOSIS — N184 Chronic kidney disease, stage 4 (severe): Secondary | ICD-10-CM | POA: Insufficient documentation

## 2011-03-27 LAB — IRON AND TIBC
Saturation Ratios: 25 % (ref 20–55)
UIBC: 152 ug/dL (ref 125–400)

## 2011-03-27 LAB — POCT HEMOGLOBIN-HEMACUE: Hemoglobin: 12.3 g/dL (ref 12.0–15.0)

## 2011-04-10 ENCOUNTER — Other Ambulatory Visit: Payer: Self-pay | Admitting: Nephrology

## 2011-04-10 ENCOUNTER — Encounter (HOSPITAL_COMMUNITY): Payer: Medicare Other

## 2011-05-01 ENCOUNTER — Encounter (HOSPITAL_COMMUNITY): Payer: Medicare Other | Attending: Nephrology

## 2011-05-01 ENCOUNTER — Other Ambulatory Visit: Payer: Self-pay | Admitting: Nephrology

## 2011-05-01 DIAGNOSIS — D638 Anemia in other chronic diseases classified elsewhere: Secondary | ICD-10-CM | POA: Insufficient documentation

## 2011-05-01 DIAGNOSIS — N184 Chronic kidney disease, stage 4 (severe): Secondary | ICD-10-CM | POA: Insufficient documentation

## 2011-05-01 LAB — IRON AND TIBC
Iron: 52 ug/dL (ref 42–135)
TIBC: 185 ug/dL — ABNORMAL LOW (ref 250–470)
UIBC: 133 ug/dL (ref 125–400)

## 2011-05-01 LAB — POCT HEMOGLOBIN-HEMACUE: Hemoglobin: 10.7 g/dL — ABNORMAL LOW (ref 12.0–15.0)

## 2011-05-04 LAB — I-STAT 8, (EC8 V) (CONVERTED LAB)
Acid-Base Excess: 3 — ABNORMAL HIGH
Bicarbonate: 28 — ABNORMAL HIGH
HCT: 33 — ABNORMAL LOW
Operator id: 196461
pCO2, Ven: 42.3 — ABNORMAL LOW

## 2011-05-04 LAB — DIFFERENTIAL
Basophils Absolute: 0
Eosinophils Absolute: 0.1
Eosinophils Relative: 1
Lymphocytes Relative: 26
Neutrophils Relative %: 64

## 2011-05-04 LAB — CBC
HCT: 30.2 — ABNORMAL LOW
MCV: 86.8
Platelets: 258
RDW: 14.8 — ABNORMAL HIGH

## 2011-05-22 ENCOUNTER — Encounter (HOSPITAL_COMMUNITY): Payer: Medicare Other

## 2011-05-22 ENCOUNTER — Other Ambulatory Visit: Payer: Self-pay | Admitting: Nephrology

## 2011-05-22 DIAGNOSIS — N186 End stage renal disease: Secondary | ICD-10-CM | POA: Insufficient documentation

## 2011-05-22 DIAGNOSIS — D638 Anemia in other chronic diseases classified elsewhere: Secondary | ICD-10-CM | POA: Insufficient documentation

## 2011-05-22 LAB — POCT HEMOGLOBIN-HEMACUE: Hemoglobin: 10 g/dL — ABNORMAL LOW (ref 12.0–15.0)

## 2011-06-09 ENCOUNTER — Other Ambulatory Visit (HOSPITAL_COMMUNITY): Payer: Self-pay | Admitting: *Deleted

## 2011-06-12 ENCOUNTER — Encounter (HOSPITAL_COMMUNITY)
Admission: RE | Admit: 2011-06-12 | Discharge: 2011-06-12 | Disposition: A | Discharge status: Home / Self Care | Payer: Medicare Other | Source: Ambulatory Visit | Attending: Nephrology | Admitting: Nephrology

## 2011-06-12 LAB — IRON AND TIBC
Iron: 61 ug/dL (ref 42–135)
Saturation Ratios: 36 % (ref 20–55)
TIBC: 168 ug/dL — ABNORMAL LOW (ref 250–470)
UIBC: 107 ug/dL — ABNORMAL LOW (ref 125–400)

## 2011-06-12 MED ORDER — EPOETIN ALFA 10000 UNIT/ML IJ SOLN: 30000.0000 [IU] | INTRAMUSCULAR | Status: DC

## 2011-06-12 MED ORDER — EPOETIN ALFA 10000 UNIT/ML IJ SOLN
INTRAMUSCULAR | Status: AC
  Administered 2011-06-12: 09:00:00
  Filled 2011-06-12: qty 1

## 2011-06-12 MED ORDER — EPOETIN ALFA 20000 UNIT/ML IJ SOLN
INTRAMUSCULAR | Status: AC
  Administered 2011-06-12: 09:00:00
  Filled 2011-06-12: qty 1

## 2011-06-16 ENCOUNTER — Encounter: Payer: Self-pay | Admitting: Vascular Surgery

## 2011-06-19 ENCOUNTER — Encounter: Payer: Self-pay | Admitting: Vascular Surgery

## 2011-06-19 ENCOUNTER — Other Ambulatory Visit: Payer: Self-pay

## 2011-06-19 DIAGNOSIS — N184 Chronic kidney disease, stage 4 (severe): Secondary | ICD-10-CM

## 2011-06-19 DIAGNOSIS — Z01810 Encounter for preprocedural cardiovascular examination: Secondary | ICD-10-CM

## 2011-06-22 LAB — POCT HEMOGLOBIN-HEMACUE: Hemoglobin: 10.3 g/dL — ABNORMAL LOW (ref 12.0–15.0)

## 2011-06-24 ENCOUNTER — Ambulatory Visit: Payer: Medicare Other | Admitting: Vascular Surgery

## 2011-06-30 ENCOUNTER — Other Ambulatory Visit (HOSPITAL_COMMUNITY): Payer: Self-pay | Admitting: *Deleted

## 2011-07-03 ENCOUNTER — Encounter (HOSPITAL_COMMUNITY)
Admission: RE | Admit: 2011-07-03 | Discharge: 2011-07-03 | Disposition: A | Discharge status: Home / Self Care | Payer: Medicare Other | Source: Ambulatory Visit | Attending: Nephrology | Admitting: Nephrology

## 2011-07-03 DIAGNOSIS — D638 Anemia in other chronic diseases classified elsewhere: Secondary | ICD-10-CM | POA: Insufficient documentation

## 2011-07-03 DIAGNOSIS — N184 Chronic kidney disease, stage 4 (severe): Secondary | ICD-10-CM | POA: Insufficient documentation

## 2011-07-03 MED ORDER — EPOETIN ALFA 10000 UNIT/ML IJ SOLN: 30000.0000 [IU] | INTRAMUSCULAR | Status: DC

## 2011-07-03 MED ORDER — EPOETIN ALFA 20000 UNIT/ML IJ SOLN
INTRAMUSCULAR | Status: AC
  Administered 2011-07-03: 20000 [IU] via SUBCUTANEOUS
  Filled 2011-07-03: qty 1

## 2011-07-03 MED ORDER — EPOETIN ALFA 10000 UNIT/ML IJ SOLN
INTRAMUSCULAR | Status: AC
  Administered 2011-07-03: 10000 [IU] via SUBCUTANEOUS
  Filled 2011-07-03: qty 1

## 2011-07-07 ENCOUNTER — Encounter: Payer: Self-pay | Admitting: Vascular Surgery

## 2011-07-08 ENCOUNTER — Other Ambulatory Visit: Payer: Medicare Other

## 2011-07-08 ENCOUNTER — Ambulatory Visit: Payer: Medicare Other | Admitting: Vascular Surgery

## 2011-07-24 ENCOUNTER — Encounter (HOSPITAL_COMMUNITY)
Admission: RE | Admit: 2011-07-24 | Discharge: 2011-07-24 | Disposition: A | Discharge status: Home / Self Care | Payer: Medicare Other | Source: Ambulatory Visit | Attending: Nephrology | Admitting: Nephrology

## 2011-07-24 DIAGNOSIS — D638 Anemia in other chronic diseases classified elsewhere: Secondary | ICD-10-CM | POA: Insufficient documentation

## 2011-07-24 DIAGNOSIS — N184 Chronic kidney disease, stage 4 (severe): Secondary | ICD-10-CM | POA: Insufficient documentation

## 2011-07-24 LAB — POCT HEMOGLOBIN-HEMACUE: Hemoglobin: 10.2 g/dL — ABNORMAL LOW (ref 12.0–15.0)

## 2011-07-24 LAB — IRON AND TIBC: Iron: 56 ug/dL (ref 42–135)

## 2011-07-24 MED ORDER — EPOETIN ALFA 10000 UNIT/ML IJ SOLN
INTRAMUSCULAR | Status: AC
  Administered 2011-07-24: 30000 [IU] via SUBCUTANEOUS
  Filled 2011-07-24: qty 1

## 2011-07-24 MED ORDER — EPOETIN ALFA 20000 UNIT/ML IJ SOLN
INTRAMUSCULAR | Status: AC
  Filled 2011-07-24: qty 1

## 2011-07-24 MED ORDER — EPOETIN ALFA 10000 UNIT/ML IJ SOLN
30000.0000 [IU] | INTRAMUSCULAR | Status: DC
  Administered 2011-07-24: 30000 [IU] via SUBCUTANEOUS

## 2011-07-31 DIAGNOSIS — N185 Chronic kidney disease, stage 5: Secondary | ICD-10-CM | POA: Diagnosis not present

## 2011-07-31 DIAGNOSIS — E213 Hyperparathyroidism, unspecified: Secondary | ICD-10-CM | POA: Diagnosis not present

## 2011-08-14 ENCOUNTER — Encounter (HOSPITAL_COMMUNITY)
Admission: RE | Admit: 2011-08-14 | Discharge: 2011-08-14 | Disposition: A | Discharge status: Home / Self Care | Payer: Medicare Other | Source: Ambulatory Visit | Attending: Nephrology | Admitting: Nephrology

## 2011-08-14 DIAGNOSIS — D638 Anemia in other chronic diseases classified elsewhere: Secondary | ICD-10-CM | POA: Diagnosis not present

## 2011-08-14 DIAGNOSIS — N184 Chronic kidney disease, stage 4 (severe): Secondary | ICD-10-CM | POA: Diagnosis not present

## 2011-08-14 MED ORDER — EPOETIN ALFA 10000 UNIT/ML IJ SOLN
INTRAMUSCULAR | Status: AC
  Administered 2011-08-14: 30000 [IU] via SUBCUTANEOUS
  Filled 2011-08-14: qty 1

## 2011-08-14 MED ORDER — EPOETIN ALFA 10000 UNIT/ML IJ SOLN
30000.0000 [IU] | INTRAMUSCULAR | Status: DC
  Administered 2011-08-14: 30000 [IU] via SUBCUTANEOUS

## 2011-08-14 MED ORDER — EPOETIN ALFA 20000 UNIT/ML IJ SOLN
INTRAMUSCULAR | Status: AC
  Filled 2011-08-14: qty 1

## 2011-08-17 LAB — POCT HEMOGLOBIN-HEMACUE: Hemoglobin: 10.3 g/dL — ABNORMAL LOW (ref 12.0–15.0)

## 2011-08-24 DIAGNOSIS — N390 Urinary tract infection, site not specified: Secondary | ICD-10-CM | POA: Diagnosis not present

## 2011-08-24 DIAGNOSIS — E119 Type 2 diabetes mellitus without complications: Secondary | ICD-10-CM | POA: Diagnosis not present

## 2011-08-24 DIAGNOSIS — N2581 Secondary hyperparathyroidism of renal origin: Secondary | ICD-10-CM | POA: Diagnosis not present

## 2011-08-24 DIAGNOSIS — I120 Hypertensive chronic kidney disease with stage 5 chronic kidney disease or end stage renal disease: Secondary | ICD-10-CM | POA: Diagnosis not present

## 2011-08-24 DIAGNOSIS — N185 Chronic kidney disease, stage 5: Secondary | ICD-10-CM | POA: Diagnosis not present

## 2011-08-24 DIAGNOSIS — I10 Essential (primary) hypertension: Secondary | ICD-10-CM | POA: Diagnosis not present

## 2011-09-01 ENCOUNTER — Encounter: Payer: Self-pay | Admitting: Vascular Surgery

## 2011-09-02 ENCOUNTER — Encounter: Payer: Self-pay | Admitting: Vascular Surgery

## 2011-09-02 ENCOUNTER — Encounter (INDEPENDENT_AMBULATORY_CARE_PROVIDER_SITE_OTHER): Payer: Medicare Other | Admitting: *Deleted

## 2011-09-02 ENCOUNTER — Ambulatory Visit (INDEPENDENT_AMBULATORY_CARE_PROVIDER_SITE_OTHER): Payer: Medicare Other | Admitting: Vascular Surgery

## 2011-09-02 VITALS — BP 154/55 | HR 54 | Resp 16 | Ht 69.0 in | Wt 167.0 lb

## 2011-09-02 DIAGNOSIS — N186 End stage renal disease: Secondary | ICD-10-CM | POA: Diagnosis not present

## 2011-09-02 DIAGNOSIS — N184 Chronic kidney disease, stage 4 (severe): Secondary | ICD-10-CM

## 2011-09-02 DIAGNOSIS — Z01810 Encounter for preprocedural cardiovascular examination: Secondary | ICD-10-CM | POA: Diagnosis not present

## 2011-09-02 NOTE — Progress Notes (Signed)
[facility] [place]  Patient name: Jackie [name] MRN: [id number] DOB: [birth year] Sex: female  REASON FOR VISIT: evaluate for new hemodialysis access.  HPI: Jackie [name] is a 77 y.o. female who is not yet on dialysis. She had a new left upper arm AV graft placed in 2011. At the time of surgery was noted that her cephalic vein was sclerotic area it was a 3 mm size vein at best. This reason I elected to place an upper arm graft. At some point in the past this clotted and was not clear that when. Therefore she has not undergone a thrombectomy. She comes in for evaluation for new access. Of note she's had no recent uremic symptoms.  Past Medical History  Diagnosis Date  . Hypertension   . Diabetes mellitus   . Chronic kidney disease   . Thyroid disease   . Anemia     Family History  Problem Relation Age of Onset  . Diabetes Mother   . Heart disease Mother   . Hypertension Mother     SOCIAL HISTORY: History  Substance Use Topics  . Smoking status: Never Smoker   . Smokeless tobacco: Not on file  . Alcohol Use: No    No Known Allergies  Current Outpatient Prescriptions  Medication Sig Dispense Refill  . amLODipine (NORVASC) 10 MG tablet Take 10 mg by mouth daily.        Marland Kitchen aspirin EC 81 MG tablet Take 81 mg by mouth daily.        . calcitRIOL (ROCALTROL) 0.5 MCG capsule Take 0.5 mcg by mouth daily.        . Calcium Carbonate Antacid (TUMS ULTRA PO) Take by mouth.        . Ferrous Gluconate (IRON) 240 (27 FE) MG TABS Take 27 mg by mouth daily.        . furosemide (LASIX) 80 MG tablet Take 80 mg by mouth 2 (two) times daily.        Marland Kitchen HYDROcodone-acetaminophen (VICODIN) 5-500 MG per tablet Take 1 tablet by mouth every 6 (six) hours as needed.        . insulin aspart (NOVOLOG) 100 UNIT/ML injection Inject 1-15 Units into the skin daily.        . insulin glargine (LANTUS) 100 UNIT/ML injection Inject 15 Units into the skin at bedtime.        . metoprolol  (TOPROL-XL) 50 MG 24 hr tablet Take 50 mg by mouth 2 (two) times daily.        . pregabalin (LYRICA) 50 MG capsule Take 50 mg by mouth 3 (three) times daily.          REVIEW OF SYSTEMS: Arly.Keller ] denotes positive finding; [  ] denotes negative finding  CARDIOVASCULAR:  [ ]  chest pain   [ ]  chest pressure   [ ]  palpitations   Arly.Keller ] orthopnea   [ ]  dyspnea on exertion   [ ]  claudication   [ ]  rest pain   [ ]  DVT   [ ]  phlebitis PULMONARY:   [ ]  productive cough   [ ]  asthma   [ ]  wheezing NEUROLOGIC:   [ ]  weakness  [ ]  paresthesias  [ ]  aphasia  [ ]  amaurosis  [ ]  dizziness HEMATOLOGIC:   [ ]  bleeding problems   [ ]  clotting disorders MUSCULOSKELETAL:  [ ]  joint pain   [ ]  joint swelling [ ]  leg swelling GASTROINTESTINAL: [ ]   blood in stool  [ ]   hematemesis GENITOURINARY:  [ ]   dysuria  [ ]   hematuria PSYCHIATRIC:  [ ]  history of major depression INTEGUMENTARY:  [ ]  rashes  [ ]  ulcers CONSTITUTIONAL:  [ ]  fever   [ ]  chills  PHYSICAL EXAM: Filed Vitals:   09/02/11 1029  BP: 154/55  Pulse: 54  Resp: 16  Height: 5\' 9"  (1.753 m)  Weight: 167 lb (75.751 kg)  SpO2: 100%   Body mass index is 24.66 kg/(m^2). GENERAL: The patient is a well-nourished female, in no acute distress. The vital signs are documented above. CARDIOVASCULAR: There is a regular rate and rhythm without significant murmur appreciated. I do not detect carotid bruits. She has a palpable brachial and radial pulse bilaterally. PULMONARY: There is good air exchange bilaterally without wheezing or rales. ABDOMEN: Soft and non-tender with normal pitched bowel sounds.  MUSCULOSKELETAL: There are no major deformities or cyanosis. NEUROLOGIC: No focal weakness or paresthesias are detected. SKIN: There are no ulcers or rashes noted. PSYCHIATRIC: The patient has a normal affect.  DATA:  I have independently interpreted her vein mapping which shows the forearm cephalic vein on the right is fairly small with diameters ranging from 14  mm to 25 mm. The upper arm cephalic vein is larger in size with diameters ranging from 28 mm to 33 mm. The basilic vein was fairly small.  MEDICAL ISSUES:  End stage renal disease This patient is not yet on dialysis. She has a chronically occluded left upper arm graft and we were asked to evaluate her for new access. She appears to have a reasonable upper arm cephalic vein on the right. I have recommended placement of a right brachiocephalic AV fistula. The forearm cephalic vein and basilic vein looks fairly small. I have explained the indications for placement of an AV fistula or AV graft. I've explained that if at all possible we will place an AV fistula.  I have reviewed the risks of placement of an AV fistula including but not limited to: failure of the fistula to mature, need for subsequent interventions, and thrombosis. All the patient's questions were answered and they are agreeable to proceed with surgery. However, she would like to discuss scheduling with her children. Hopefully she will call near future to schedule placement of an right brachiocephalic AV fistula.     Paullette Mckain S [facility] [place]: [phone]

## 2011-09-02 NOTE — Assessment & Plan Note (Signed)
This patient is not yet on dialysis. She has a chronically occluded left upper arm graft and we were asked to evaluate her for new access. She appears to have a reasonable upper arm cephalic vein on the right. I have recommended placement of a right brachiocephalic AV fistula. The forearm cephalic vein and basilic vein looks fairly small. I have explained the indications for placement of an AV fistula or AV graft. I've explained that if at all possible we will place an AV fistula.  I have reviewed the risks of placement of an AV fistula including but not limited to: failure of the fistula to mature, need for subsequent interventions, and thrombosis. All the patient's questions were answered and they are agreeable to proceed with surgery. However, she would like to discuss scheduling with her children. Hopefully she will call near future to schedule placement of an right brachiocephalic AV fistula.

## 2011-09-04 ENCOUNTER — Encounter (HOSPITAL_COMMUNITY)
Admission: RE | Admit: 2011-09-04 | Discharge: 2011-09-04 | Disposition: A | Discharge status: Home / Self Care | Payer: Medicare Other | Source: Ambulatory Visit | Attending: Nephrology | Admitting: Nephrology

## 2011-09-04 DIAGNOSIS — N184 Chronic kidney disease, stage 4 (severe): Secondary | ICD-10-CM | POA: Insufficient documentation

## 2011-09-04 DIAGNOSIS — D638 Anemia in other chronic diseases classified elsewhere: Secondary | ICD-10-CM | POA: Diagnosis not present

## 2011-09-04 LAB — POCT HEMOGLOBIN-HEMACUE: Hemoglobin: 10.7 g/dL — ABNORMAL LOW (ref 12.0–15.0)

## 2011-09-04 LAB — IRON AND TIBC
Saturation Ratios: 25 % (ref 20–55)
UIBC: 144 ug/dL (ref 125–400)

## 2011-09-04 MED ORDER — EPOETIN ALFA 10000 UNIT/ML IJ SOLN
30000.0000 [IU] | INTRAMUSCULAR | Status: DC
  Administered 2011-09-04: 30000 [IU] via SUBCUTANEOUS

## 2011-09-04 MED ORDER — EPOETIN ALFA 20000 UNIT/ML IJ SOLN
INTRAMUSCULAR | Status: AC
  Filled 2011-09-04: qty 1

## 2011-09-04 MED ORDER — EPOETIN ALFA 10000 UNIT/ML IJ SOLN
INTRAMUSCULAR | Status: AC
  Administered 2011-09-04: 30000 [IU] via SUBCUTANEOUS
  Filled 2011-09-04: qty 1

## 2011-09-09 NOTE — Procedures (Unsigned)
CEPHALIC VEIN MAPPING  INDICATION:  Preoperative vein mapping for AVF placement.  HISTORY: Chronic kidney disease, stage IV, diabetes, hypertension, clotted left upper arm graft.  EXAM:  The right cephalic vein is patent and compressible. Diameter measurements range from 0.34 to 0.14 cm.  The right basilic vein is patent and compressible where visualized in the upper arm with diameter measurements ranging from 0.23 to 0.21 cm.  See attached worksheet for all measurements.  IMPRESSION:  Patent right cephalic and basilic vein with diameter measurements shown on the following work sheet.      ___________________________________________ Di Kindle. Edilia Bo, M.D.  EM/MEDQ  D:  09/02/2011  T:  09/02/2011  Job:  [id number]

## 2011-09-23 ENCOUNTER — Other Ambulatory Visit (HOSPITAL_COMMUNITY): Payer: Self-pay | Admitting: *Deleted

## 2011-09-25 ENCOUNTER — Encounter (HOSPITAL_COMMUNITY)
Admission: RE | Admit: 2011-09-25 | Discharge: 2011-09-25 | Disposition: A | Discharge status: Home / Self Care | Payer: Medicare Other | Source: Ambulatory Visit | Attending: Nephrology | Admitting: Nephrology

## 2011-09-25 DIAGNOSIS — N184 Chronic kidney disease, stage 4 (severe): Secondary | ICD-10-CM | POA: Insufficient documentation

## 2011-09-25 DIAGNOSIS — D638 Anemia in other chronic diseases classified elsewhere: Secondary | ICD-10-CM | POA: Diagnosis not present

## 2011-09-25 MED ORDER — EPOETIN ALFA 10000 UNIT/ML IJ SOLN
INTRAMUSCULAR | Status: AC
  Filled 2011-09-25: qty 1

## 2011-09-25 MED ORDER — EPOETIN ALFA 10000 UNIT/ML IJ SOLN
30000.0000 [IU] | INTRAMUSCULAR | Status: DC
  Administered 2011-09-25: 10000 [IU] via SUBCUTANEOUS

## 2011-09-25 MED ORDER — EPOETIN ALFA 20000 UNIT/ML IJ SOLN
INTRAMUSCULAR | Status: AC
  Administered 2011-09-25: 20000 [IU] via SUBCUTANEOUS
  Filled 2011-09-25: qty 1

## 2011-10-16 ENCOUNTER — Encounter (HOSPITAL_COMMUNITY)
Admission: RE | Admit: 2011-10-16 | Discharge: 2011-10-16 | Disposition: A | Discharge status: Home / Self Care | Payer: Medicare Other | Source: Ambulatory Visit | Attending: Nephrology | Admitting: Nephrology

## 2011-10-16 DIAGNOSIS — N184 Chronic kidney disease, stage 4 (severe): Secondary | ICD-10-CM | POA: Diagnosis not present

## 2011-10-16 DIAGNOSIS — D638 Anemia in other chronic diseases classified elsewhere: Secondary | ICD-10-CM | POA: Diagnosis not present

## 2011-10-16 LAB — IRON AND TIBC
Iron: 95 ug/dL (ref 42–135)
TIBC: 205 ug/dL — ABNORMAL LOW (ref 250–470)

## 2011-10-16 MED ORDER — EPOETIN ALFA 10000 UNIT/ML IJ SOLN
30000.0000 [IU] | INTRAMUSCULAR | Status: DC
  Administered 2011-10-16: 10000 [IU] via SUBCUTANEOUS

## 2011-10-16 MED ORDER — EPOETIN ALFA 10000 UNIT/ML IJ SOLN
INTRAMUSCULAR | Status: AC
  Filled 2011-10-16: qty 1

## 2011-10-16 MED ORDER — EPOETIN ALFA 20000 UNIT/ML IJ SOLN
INTRAMUSCULAR | Status: AC
  Administered 2011-10-16: 20000 [IU] via SUBCUTANEOUS
  Filled 2011-10-16: qty 1

## 2011-11-04 DIAGNOSIS — E213 Hyperparathyroidism, unspecified: Secondary | ICD-10-CM | POA: Diagnosis not present

## 2011-11-04 DIAGNOSIS — N185 Chronic kidney disease, stage 5: Secondary | ICD-10-CM | POA: Diagnosis not present

## 2011-11-04 DIAGNOSIS — N2581 Secondary hyperparathyroidism of renal origin: Secondary | ICD-10-CM | POA: Diagnosis not present

## 2011-11-04 DIAGNOSIS — I120 Hypertensive chronic kidney disease with stage 5 chronic kidney disease or end stage renal disease: Secondary | ICD-10-CM | POA: Diagnosis not present

## 2011-11-04 DIAGNOSIS — D649 Anemia, unspecified: Secondary | ICD-10-CM | POA: Diagnosis not present

## 2011-11-04 DIAGNOSIS — I10 Essential (primary) hypertension: Secondary | ICD-10-CM | POA: Diagnosis not present

## 2011-11-06 ENCOUNTER — Encounter (HOSPITAL_COMMUNITY)
Admission: RE | Admit: 2011-11-06 | Discharge: 2011-11-06 | Disposition: A | Discharge status: Home / Self Care | Payer: Medicare Other | Source: Ambulatory Visit | Attending: Nephrology | Admitting: Nephrology

## 2011-11-06 DIAGNOSIS — N184 Chronic kidney disease, stage 4 (severe): Secondary | ICD-10-CM | POA: Diagnosis not present

## 2011-11-06 DIAGNOSIS — D638 Anemia in other chronic diseases classified elsewhere: Secondary | ICD-10-CM | POA: Insufficient documentation

## 2011-11-06 MED ORDER — EPOETIN ALFA 20000 UNIT/ML IJ SOLN
INTRAMUSCULAR | Status: AC
  Filled 2011-11-06: qty 1

## 2011-11-06 MED ORDER — EPOETIN ALFA 10000 UNIT/ML IJ SOLN
INTRAMUSCULAR | Status: AC
  Filled 2011-11-06: qty 1

## 2011-11-06 MED ORDER — EPOETIN ALFA 10000 UNIT/ML IJ SOLN
30000.0000 [IU] | INTRAMUSCULAR | Status: DC
  Administered 2011-11-06: 30000 [IU] via SUBCUTANEOUS

## 2011-11-09 LAB — POCT HEMOGLOBIN-HEMACUE: Hemoglobin: 10.7 g/dL — ABNORMAL LOW (ref 12.0–15.0)

## 2011-11-18 DIAGNOSIS — I10 Essential (primary) hypertension: Secondary | ICD-10-CM | POA: Diagnosis not present

## 2011-11-23 DIAGNOSIS — I10 Essential (primary) hypertension: Secondary | ICD-10-CM | POA: Diagnosis not present

## 2011-11-23 DIAGNOSIS — E119 Type 2 diabetes mellitus without complications: Secondary | ICD-10-CM | POA: Diagnosis not present

## 2011-11-27 ENCOUNTER — Encounter (HOSPITAL_COMMUNITY)
Admission: RE | Admit: 2011-11-27 | Discharge: 2011-11-27 | Disposition: A | Discharge status: Home / Self Care | Payer: Medicare Other | Source: Ambulatory Visit | Attending: Nephrology | Admitting: Nephrology

## 2011-11-27 DIAGNOSIS — D638 Anemia in other chronic diseases classified elsewhere: Secondary | ICD-10-CM | POA: Insufficient documentation

## 2011-11-27 DIAGNOSIS — N184 Chronic kidney disease, stage 4 (severe): Secondary | ICD-10-CM | POA: Insufficient documentation

## 2011-11-27 MED ORDER — EPOETIN ALFA 10000 UNIT/ML IJ SOLN
30000.0000 [IU] | INTRAMUSCULAR | Status: DC
  Administered 2011-11-27: 30000 [IU] via SUBCUTANEOUS

## 2011-11-27 MED ORDER — EPOETIN ALFA 10000 UNIT/ML IJ SOLN
INTRAMUSCULAR | Status: AC
  Filled 2011-11-27: qty 1

## 2011-11-27 MED ORDER — EPOETIN ALFA 20000 UNIT/ML IJ SOLN
INTRAMUSCULAR | Status: AC
  Filled 2011-11-27: qty 1

## 2011-12-18 ENCOUNTER — Encounter (HOSPITAL_COMMUNITY)
Admission: RE | Admit: 2011-12-18 | Discharge: 2011-12-18 | Disposition: A | Discharge status: Home / Self Care | Payer: Medicare Other | Source: Ambulatory Visit | Attending: Nephrology | Admitting: Nephrology

## 2011-12-18 LAB — IRON AND TIBC
Iron: 50 ug/dL (ref 42–135)
Saturation Ratios: 28 % (ref 20–55)
TIBC: 178 ug/dL — ABNORMAL LOW (ref 250–470)

## 2011-12-18 MED ORDER — EPOETIN ALFA 10000 UNIT/ML IJ SOLN
INTRAMUSCULAR | Status: AC
  Administered 2011-12-18: 30000 [IU] via SUBCUTANEOUS
  Filled 2011-12-18: qty 1

## 2011-12-18 MED ORDER — EPOETIN ALFA 20000 UNIT/ML IJ SOLN
INTRAMUSCULAR | Status: AC
  Filled 2011-12-18: qty 1

## 2011-12-18 MED ORDER — EPOETIN ALFA 10000 UNIT/ML IJ SOLN
30000.0000 [IU] | INTRAMUSCULAR | Status: DC
  Administered 2011-12-18: 30000 [IU] via SUBCUTANEOUS

## 2012-01-01 ENCOUNTER — Other Ambulatory Visit (HOSPITAL_COMMUNITY): Payer: Self-pay | Admitting: *Deleted

## 2012-01-04 DIAGNOSIS — N390 Urinary tract infection, site not specified: Secondary | ICD-10-CM | POA: Diagnosis not present

## 2012-01-04 DIAGNOSIS — E119 Type 2 diabetes mellitus without complications: Secondary | ICD-10-CM | POA: Diagnosis not present

## 2012-01-04 DIAGNOSIS — N185 Chronic kidney disease, stage 5: Secondary | ICD-10-CM | POA: Diagnosis not present

## 2012-01-04 DIAGNOSIS — I120 Hypertensive chronic kidney disease with stage 5 chronic kidney disease or end stage renal disease: Secondary | ICD-10-CM | POA: Diagnosis not present

## 2012-01-04 DIAGNOSIS — D649 Anemia, unspecified: Secondary | ICD-10-CM | POA: Diagnosis not present

## 2012-01-04 DIAGNOSIS — I10 Essential (primary) hypertension: Secondary | ICD-10-CM | POA: Diagnosis not present

## 2012-01-04 DIAGNOSIS — N2581 Secondary hyperparathyroidism of renal origin: Secondary | ICD-10-CM | POA: Diagnosis not present

## 2012-01-04 DIAGNOSIS — E213 Hyperparathyroidism, unspecified: Secondary | ICD-10-CM | POA: Diagnosis not present

## 2012-01-04 DIAGNOSIS — E785 Hyperlipidemia, unspecified: Secondary | ICD-10-CM | POA: Diagnosis not present

## 2012-01-08 ENCOUNTER — Encounter (HOSPITAL_COMMUNITY)
Admission: RE | Admit: 2012-01-08 | Discharge: 2012-01-08 | Disposition: A | Discharge status: Home / Self Care | Payer: Medicare Other | Source: Ambulatory Visit | Attending: Nephrology | Admitting: Nephrology

## 2012-01-08 DIAGNOSIS — N184 Chronic kidney disease, stage 4 (severe): Secondary | ICD-10-CM | POA: Insufficient documentation

## 2012-01-08 DIAGNOSIS — D638 Anemia in other chronic diseases classified elsewhere: Secondary | ICD-10-CM | POA: Insufficient documentation

## 2012-01-08 LAB — POCT HEMOGLOBIN-HEMACUE: Hemoglobin: 10.9 g/dL — ABNORMAL LOW (ref 12.0–15.0)

## 2012-01-08 MED ORDER — EPOETIN ALFA 10000 UNIT/ML IJ SOLN
INTRAMUSCULAR | Status: AC
  Administered 2012-01-08: 30000 [IU] via SUBCUTANEOUS
  Filled 2012-01-08: qty 1

## 2012-01-08 MED ORDER — EPOETIN ALFA 20000 UNIT/ML IJ SOLN
INTRAMUSCULAR | Status: AC
  Filled 2012-01-08: qty 1

## 2012-01-08 MED ORDER — EPOETIN ALFA 10000 UNIT/ML IJ SOLN
30000.0000 [IU] | INTRAMUSCULAR | Status: DC
  Administered 2012-01-08: 30000 [IU] via SUBCUTANEOUS

## 2012-01-22 DIAGNOSIS — N186 End stage renal disease: Secondary | ICD-10-CM | POA: Diagnosis not present

## 2012-01-22 DIAGNOSIS — E119 Type 2 diabetes mellitus without complications: Secondary | ICD-10-CM | POA: Diagnosis not present

## 2012-01-29 ENCOUNTER — Encounter (HOSPITAL_COMMUNITY)
Admission: RE | Admit: 2012-01-29 | Discharge: 2012-01-29 | Disposition: A | Discharge status: Home / Self Care | Payer: Medicare Other | Source: Ambulatory Visit | Attending: Nephrology | Admitting: Nephrology

## 2012-01-29 DIAGNOSIS — D638 Anemia in other chronic diseases classified elsewhere: Secondary | ICD-10-CM | POA: Diagnosis not present

## 2012-01-29 DIAGNOSIS — N184 Chronic kidney disease, stage 4 (severe): Secondary | ICD-10-CM | POA: Insufficient documentation

## 2012-01-29 MED ORDER — EPOETIN ALFA 10000 UNIT/ML IJ SOLN
INTRAMUSCULAR | Status: AC
  Filled 2012-01-29: qty 1

## 2012-01-29 MED ORDER — EPOETIN ALFA 20000 UNIT/ML IJ SOLN
INTRAMUSCULAR | Status: AC
  Filled 2012-01-29: qty 1

## 2012-01-29 MED ORDER — EPOETIN ALFA 10000 UNIT/ML IJ SOLN
30000.0000 [IU] | INTRAMUSCULAR | Status: DC
  Administered 2012-01-29: 30000 [IU] via SUBCUTANEOUS

## 2012-01-30 LAB — IRON AND TIBC: Iron: 76 ug/dL (ref 42–135)

## 2012-02-15 DIAGNOSIS — N185 Chronic kidney disease, stage 5: Secondary | ICD-10-CM | POA: Diagnosis not present

## 2012-02-15 DIAGNOSIS — E213 Hyperparathyroidism, unspecified: Secondary | ICD-10-CM | POA: Diagnosis not present

## 2012-02-19 ENCOUNTER — Encounter (HOSPITAL_COMMUNITY): Payer: Medicare Other

## 2012-02-25 ENCOUNTER — Other Ambulatory Visit (HOSPITAL_COMMUNITY): Payer: Self-pay | Admitting: *Deleted

## 2012-02-26 ENCOUNTER — Encounter (HOSPITAL_COMMUNITY)
Admission: RE | Admit: 2012-02-26 | Discharge: 2012-02-26 | Disposition: A | Discharge status: Home / Self Care | Payer: Medicare Other | Source: Ambulatory Visit | Attending: Nephrology | Admitting: Nephrology

## 2012-02-26 DIAGNOSIS — D638 Anemia in other chronic diseases classified elsewhere: Secondary | ICD-10-CM | POA: Diagnosis not present

## 2012-02-26 DIAGNOSIS — N184 Chronic kidney disease, stage 4 (severe): Secondary | ICD-10-CM | POA: Insufficient documentation

## 2012-02-26 LAB — IRON AND TIBC
Saturation Ratios: 41 % (ref 20–55)
UIBC: 101 ug/dL — ABNORMAL LOW (ref 125–400)

## 2012-02-26 LAB — POCT HEMOGLOBIN-HEMACUE: Hemoglobin: 8.4 g/dL — ABNORMAL LOW (ref 12.0–15.0)

## 2012-02-26 LAB — FERRITIN: Ferritin: 1107 ng/mL — ABNORMAL HIGH (ref 10–291)

## 2012-02-26 MED ORDER — EPOETIN ALFA 10000 UNIT/ML IJ SOLN
30000.0000 [IU] | INTRAMUSCULAR | Status: DC
  Administered 2012-02-26: 10000 [IU] via SUBCUTANEOUS
  Filled 2012-02-26: qty 1

## 2012-02-26 MED ORDER — EPOETIN ALFA 20000 UNIT/ML IJ SOLN
INTRAMUSCULAR | Status: AC
  Administered 2012-02-26: 20000 [IU] via SUBCUTANEOUS
  Filled 2012-02-26: qty 1

## 2012-02-29 DIAGNOSIS — N185 Chronic kidney disease, stage 5: Secondary | ICD-10-CM | POA: Diagnosis not present

## 2012-02-29 DIAGNOSIS — I10 Essential (primary) hypertension: Secondary | ICD-10-CM | POA: Diagnosis not present

## 2012-02-29 DIAGNOSIS — E119 Type 2 diabetes mellitus without complications: Secondary | ICD-10-CM | POA: Diagnosis not present

## 2012-02-29 DIAGNOSIS — E213 Hyperparathyroidism, unspecified: Secondary | ICD-10-CM | POA: Diagnosis not present

## 2012-02-29 DIAGNOSIS — N2581 Secondary hyperparathyroidism of renal origin: Secondary | ICD-10-CM | POA: Diagnosis not present

## 2012-02-29 DIAGNOSIS — D649 Anemia, unspecified: Secondary | ICD-10-CM | POA: Diagnosis not present

## 2012-02-29 DIAGNOSIS — I120 Hypertensive chronic kidney disease with stage 5 chronic kidney disease or end stage renal disease: Secondary | ICD-10-CM | POA: Diagnosis not present

## 2012-02-29 DIAGNOSIS — N390 Urinary tract infection, site not specified: Secondary | ICD-10-CM | POA: Diagnosis not present

## 2012-03-14 DIAGNOSIS — N185 Chronic kidney disease, stage 5: Secondary | ICD-10-CM | POA: Diagnosis not present

## 2012-03-22 DIAGNOSIS — N189 Chronic kidney disease, unspecified: Secondary | ICD-10-CM | POA: Diagnosis not present

## 2012-03-22 DIAGNOSIS — E1129 Type 2 diabetes mellitus with other diabetic kidney complication: Secondary | ICD-10-CM | POA: Diagnosis not present

## 2012-03-22 DIAGNOSIS — I129 Hypertensive chronic kidney disease with stage 1 through stage 4 chronic kidney disease, or unspecified chronic kidney disease: Secondary | ICD-10-CM | POA: Diagnosis not present

## 2012-03-24 ENCOUNTER — Other Ambulatory Visit (HOSPITAL_COMMUNITY): Payer: Self-pay | Admitting: *Deleted

## 2012-03-25 ENCOUNTER — Encounter (HOSPITAL_COMMUNITY)
Admission: RE | Admit: 2012-03-25 | Discharge: 2012-03-25 | Disposition: A | Discharge status: Home / Self Care | Payer: Medicare Other | Source: Ambulatory Visit | Attending: Nephrology | Admitting: Nephrology

## 2012-03-25 DIAGNOSIS — N184 Chronic kidney disease, stage 4 (severe): Secondary | ICD-10-CM | POA: Diagnosis not present

## 2012-03-25 DIAGNOSIS — D638 Anemia in other chronic diseases classified elsewhere: Secondary | ICD-10-CM | POA: Insufficient documentation

## 2012-03-25 LAB — IRON AND TIBC: Iron: 58 ug/dL (ref 42–135)

## 2012-03-25 MED ORDER — EPOETIN ALFA 10000 UNIT/ML IJ SOLN
30000.0000 [IU] | INTRAMUSCULAR | Status: DC
  Administered 2012-03-25: 30000 [IU] via SUBCUTANEOUS

## 2012-03-25 MED ORDER — EPOETIN ALFA 20000 UNIT/ML IJ SOLN
INTRAMUSCULAR | Status: AC
  Filled 2012-03-25: qty 1

## 2012-03-25 MED ORDER — EPOETIN ALFA 10000 UNIT/ML IJ SOLN
INTRAMUSCULAR | Status: AC
  Administered 2012-03-25: 30000 [IU] via SUBCUTANEOUS
  Filled 2012-03-25: qty 1

## 2012-04-08 ENCOUNTER — Encounter (HOSPITAL_COMMUNITY)
Admission: RE | Admit: 2012-04-08 | Discharge: 2012-04-08 | Disposition: A | Discharge status: Home / Self Care | Payer: Medicare Other | Source: Ambulatory Visit | Attending: Nephrology | Admitting: Nephrology

## 2012-04-08 MED ORDER — EPOETIN ALFA 10000 UNIT/ML IJ SOLN
30000.0000 [IU] | INTRAMUSCULAR | Status: DC
  Administered 2012-04-08: 10000 [IU] via SUBCUTANEOUS

## 2012-04-08 MED ORDER — EPOETIN ALFA 20000 UNIT/ML IJ SOLN
INTRAMUSCULAR | Status: AC
  Administered 2012-04-08: 20000 [IU] via SUBCUTANEOUS
  Filled 2012-04-08: qty 1

## 2012-04-08 MED ORDER — EPOETIN ALFA 10000 UNIT/ML IJ SOLN
INTRAMUSCULAR | Status: AC
  Administered 2012-04-08: 10000 [IU] via SUBCUTANEOUS
  Filled 2012-04-08: qty 1

## 2012-04-14 DIAGNOSIS — N185 Chronic kidney disease, stage 5: Secondary | ICD-10-CM | POA: Diagnosis not present

## 2012-04-22 ENCOUNTER — Encounter (HOSPITAL_COMMUNITY)
Admission: RE | Admit: 2012-04-22 | Discharge: 2012-04-22 | Disposition: A | Discharge status: Home / Self Care | Payer: Medicare Other | Source: Ambulatory Visit | Attending: Nephrology | Admitting: Nephrology

## 2012-04-22 DIAGNOSIS — N184 Chronic kidney disease, stage 4 (severe): Secondary | ICD-10-CM | POA: Insufficient documentation

## 2012-04-22 DIAGNOSIS — D638 Anemia in other chronic diseases classified elsewhere: Secondary | ICD-10-CM | POA: Insufficient documentation

## 2012-04-22 LAB — IRON AND TIBC
Iron: 39 ug/dL — ABNORMAL LOW (ref 42–135)
Saturation Ratios: 24 % (ref 20–55)
TIBC: 163 ug/dL — ABNORMAL LOW (ref 250–470)
UIBC: 124 ug/dL — ABNORMAL LOW (ref 125–400)

## 2012-04-22 LAB — POCT HEMOGLOBIN-HEMACUE: Hemoglobin: 10.4 g/dL — ABNORMAL LOW (ref 12.0–15.0)

## 2012-04-22 MED ORDER — EPOETIN ALFA 10000 UNIT/ML IJ SOLN
30000.0000 [IU] | INTRAMUSCULAR | Status: DC
  Administered 2012-04-22: 10000 [IU] via SUBCUTANEOUS

## 2012-04-22 MED ORDER — EPOETIN ALFA 20000 UNIT/ML IJ SOLN
INTRAMUSCULAR | Status: AC
  Administered 2012-04-22: 20000 [IU] via SUBCUTANEOUS
  Filled 2012-04-22: qty 1

## 2012-04-22 MED ORDER — EPOETIN ALFA 10000 UNIT/ML IJ SOLN
INTRAMUSCULAR | Status: AC
  Filled 2012-04-22: qty 1

## 2012-05-06 ENCOUNTER — Encounter (HOSPITAL_COMMUNITY): Payer: Medicare Other

## 2012-05-23 DIAGNOSIS — E785 Hyperlipidemia, unspecified: Secondary | ICD-10-CM | POA: Diagnosis not present

## 2012-05-23 DIAGNOSIS — D649 Anemia, unspecified: Secondary | ICD-10-CM | POA: Diagnosis not present

## 2012-05-23 DIAGNOSIS — I10 Essential (primary) hypertension: Secondary | ICD-10-CM | POA: Diagnosis not present

## 2012-05-23 DIAGNOSIS — N185 Chronic kidney disease, stage 5: Secondary | ICD-10-CM | POA: Diagnosis not present

## 2012-05-23 DIAGNOSIS — Z23 Encounter for immunization: Secondary | ICD-10-CM | POA: Diagnosis not present

## 2012-05-23 DIAGNOSIS — I120 Hypertensive chronic kidney disease with stage 5 chronic kidney disease or end stage renal disease: Secondary | ICD-10-CM | POA: Diagnosis not present

## 2012-05-23 DIAGNOSIS — N390 Urinary tract infection, site not specified: Secondary | ICD-10-CM | POA: Diagnosis not present

## 2012-06-01 DIAGNOSIS — N185 Chronic kidney disease, stage 5: Secondary | ICD-10-CM | POA: Diagnosis not present

## 2012-06-20 DIAGNOSIS — I120 Hypertensive chronic kidney disease with stage 5 chronic kidney disease or end stage renal disease: Secondary | ICD-10-CM | POA: Diagnosis not present

## 2012-06-20 DIAGNOSIS — E1129 Type 2 diabetes mellitus with other diabetic kidney complication: Secondary | ICD-10-CM | POA: Diagnosis not present

## 2012-06-20 DIAGNOSIS — N185 Chronic kidney disease, stage 5: Secondary | ICD-10-CM | POA: Diagnosis not present

## 2012-06-30 ENCOUNTER — Other Ambulatory Visit (HOSPITAL_COMMUNITY): Payer: Self-pay | Admitting: *Deleted

## 2012-07-01 ENCOUNTER — Encounter (HOSPITAL_COMMUNITY)
Admission: RE | Admit: 2012-07-01 | Discharge: 2012-07-01 | Disposition: A | Discharge status: Home / Self Care | Payer: Medicare Other | Source: Ambulatory Visit | Attending: Nephrology | Admitting: Nephrology

## 2012-07-01 DIAGNOSIS — D638 Anemia in other chronic diseases classified elsewhere: Secondary | ICD-10-CM | POA: Diagnosis not present

## 2012-07-01 DIAGNOSIS — N184 Chronic kidney disease, stage 4 (severe): Secondary | ICD-10-CM | POA: Diagnosis not present

## 2012-07-01 LAB — IRON AND TIBC
Iron: 45 ug/dL (ref 42–135)
Saturation Ratios: 23 % (ref 20–55)
UIBC: 151 ug/dL (ref 125–400)

## 2012-07-01 MED ORDER — EPOETIN ALFA 10000 UNIT/ML IJ SOLN
30000.0000 [IU] | INTRAMUSCULAR | Status: DC
  Administered 2012-07-01: 10000 [IU] via SUBCUTANEOUS

## 2012-07-01 MED ORDER — EPOETIN ALFA 20000 UNIT/ML IJ SOLN
INTRAMUSCULAR | Status: AC
  Administered 2012-07-01: 20000 [IU] via SUBCUTANEOUS
  Filled 2012-07-01: qty 1

## 2012-07-01 MED ORDER — EPOETIN ALFA 10000 UNIT/ML IJ SOLN
INTRAMUSCULAR | Status: AC
  Administered 2012-07-01: 10000 [IU] via SUBCUTANEOUS
  Filled 2012-07-01: qty 1

## 2012-07-14 ENCOUNTER — Other Ambulatory Visit (HOSPITAL_COMMUNITY): Payer: Self-pay | Admitting: *Deleted

## 2012-07-15 ENCOUNTER — Encounter (HOSPITAL_COMMUNITY)
Admission: RE | Admit: 2012-07-15 | Discharge: 2012-07-15 | Disposition: A | Discharge status: Home / Self Care | Payer: Medicare Other | Source: Ambulatory Visit | Attending: Nephrology | Admitting: Nephrology

## 2012-07-15 DIAGNOSIS — D638 Anemia in other chronic diseases classified elsewhere: Secondary | ICD-10-CM | POA: Diagnosis not present

## 2012-07-15 DIAGNOSIS — N184 Chronic kidney disease, stage 4 (severe): Secondary | ICD-10-CM | POA: Diagnosis not present

## 2012-07-15 MED ORDER — EPOETIN ALFA 10000 UNIT/ML IJ SOLN: 40000.0000 [IU] | INTRAMUSCULAR | Status: DC

## 2012-07-15 MED ORDER — EPOETIN ALFA 40000 UNIT/ML IJ SOLN
INTRAMUSCULAR | Status: AC
  Administered 2012-07-15: 40000 [IU] via SUBCUTANEOUS
  Filled 2012-07-15: qty 1

## 2012-07-25 DIAGNOSIS — N390 Urinary tract infection, site not specified: Secondary | ICD-10-CM | POA: Diagnosis not present

## 2012-07-25 DIAGNOSIS — N2581 Secondary hyperparathyroidism of renal origin: Secondary | ICD-10-CM | POA: Diagnosis not present

## 2012-07-25 DIAGNOSIS — I120 Hypertensive chronic kidney disease with stage 5 chronic kidney disease or end stage renal disease: Secondary | ICD-10-CM | POA: Diagnosis not present

## 2012-07-25 DIAGNOSIS — D649 Anemia, unspecified: Secondary | ICD-10-CM | POA: Diagnosis not present

## 2012-07-25 DIAGNOSIS — N185 Chronic kidney disease, stage 5: Secondary | ICD-10-CM | POA: Diagnosis not present

## 2012-07-29 ENCOUNTER — Encounter (HOSPITAL_COMMUNITY)
Admission: RE | Admit: 2012-07-29 | Discharge: 2012-07-29 | Disposition: A | Discharge status: Home / Self Care | Payer: Medicare Other | Source: Ambulatory Visit | Attending: Nephrology | Admitting: Nephrology

## 2012-07-29 DIAGNOSIS — N184 Chronic kidney disease, stage 4 (severe): Secondary | ICD-10-CM | POA: Diagnosis not present

## 2012-07-29 DIAGNOSIS — D638 Anemia in other chronic diseases classified elsewhere: Secondary | ICD-10-CM | POA: Diagnosis not present

## 2012-07-29 LAB — POCT HEMOGLOBIN-HEMACUE: Hemoglobin: 9.2 g/dL — ABNORMAL LOW (ref 12.0–15.0)

## 2012-07-29 LAB — IRON AND TIBC
Iron: 31 ug/dL — ABNORMAL LOW (ref 42–135)
TIBC: 198 ug/dL — ABNORMAL LOW (ref 250–470)
UIBC: 167 ug/dL (ref 125–400)

## 2012-07-29 MED ORDER — EPOETIN ALFA 40000 UNIT/ML IJ SOLN
INTRAMUSCULAR | Status: AC
  Administered 2012-07-29: 40000 [IU] via SUBCUTANEOUS
  Filled 2012-07-29: qty 1

## 2012-07-29 MED ORDER — EPOETIN ALFA 10000 UNIT/ML IJ SOLN: 40000.0000 [IU] | INTRAMUSCULAR | Status: DC

## 2012-08-11 ENCOUNTER — Other Ambulatory Visit (HOSPITAL_COMMUNITY): Payer: Self-pay | Admitting: *Deleted

## 2012-08-12 ENCOUNTER — Encounter (HOSPITAL_COMMUNITY)
Admission: RE | Admit: 2012-08-12 | Discharge: 2012-08-12 | Disposition: A | Discharge status: Home / Self Care | Payer: Medicare Other | Source: Ambulatory Visit | Attending: Nephrology | Admitting: Nephrology

## 2012-08-12 DIAGNOSIS — N184 Chronic kidney disease, stage 4 (severe): Secondary | ICD-10-CM | POA: Diagnosis not present

## 2012-08-12 DIAGNOSIS — D638 Anemia in other chronic diseases classified elsewhere: Secondary | ICD-10-CM | POA: Diagnosis not present

## 2012-08-12 LAB — POCT HEMOGLOBIN-HEMACUE: Hemoglobin: 9.8 g/dL — ABNORMAL LOW (ref 12.0–15.0)

## 2012-08-12 MED ORDER — FERUMOXYTOL INJECTION 510 MG/17 ML: 510.0000 mg | Freq: Once | INTRAVENOUS | Status: DC

## 2012-08-12 MED ORDER — FERUMOXYTOL INJECTION 510 MG/17 ML
INTRAVENOUS | Status: AC
  Administered 2012-08-12: 510 mg
  Filled 2012-08-12: qty 17

## 2012-08-12 MED ORDER — EPOETIN ALFA 10000 UNIT/ML IJ SOLN: 40000.0000 [IU] | INTRAMUSCULAR | Status: DC

## 2012-08-12 MED ORDER — SODIUM CHLORIDE 0.9 % IV SOLN
Freq: Once | INTRAVENOUS | Status: AC
  Administered 2012-08-12: 14:00:00 via INTRAVENOUS

## 2012-08-12 MED ORDER — EPOETIN ALFA 2000 UNIT/ML IJ SOLN
INTRAMUSCULAR | Status: AC
  Filled 2012-08-12: qty 1

## 2012-08-12 MED ORDER — EPOETIN ALFA 40000 UNIT/ML IJ SOLN
INTRAMUSCULAR | Status: AC
  Administered 2012-08-12: 40000 [IU] via SUBCUTANEOUS
  Filled 2012-08-12: qty 1

## 2012-08-21 ENCOUNTER — Encounter (HOSPITAL_COMMUNITY): Payer: Self-pay | Admitting: Emergency Medicine

## 2012-08-21 ENCOUNTER — Emergency Department (HOSPITAL_COMMUNITY)
Admission: EM | Admit: 2012-08-21 | Discharge: 2012-08-21 | Disposition: A | Discharge status: Home / Self Care | Payer: Medicare Other | Attending: Emergency Medicine | Admitting: Emergency Medicine

## 2012-08-21 DIAGNOSIS — E119 Type 2 diabetes mellitus without complications: Secondary | ICD-10-CM | POA: Insufficient documentation

## 2012-08-21 DIAGNOSIS — K2990 Gastroduodenitis, unspecified, without bleeding: Secondary | ICD-10-CM | POA: Insufficient documentation

## 2012-08-21 DIAGNOSIS — K2900 Acute gastritis without bleeding: Secondary | ICD-10-CM | POA: Diagnosis not present

## 2012-08-21 DIAGNOSIS — N189 Chronic kidney disease, unspecified: Secondary | ICD-10-CM | POA: Insufficient documentation

## 2012-08-21 DIAGNOSIS — D649 Anemia, unspecified: Secondary | ICD-10-CM | POA: Insufficient documentation

## 2012-08-21 DIAGNOSIS — Z794 Long term (current) use of insulin: Secondary | ICD-10-CM | POA: Diagnosis not present

## 2012-08-21 DIAGNOSIS — R1013 Epigastric pain: Secondary | ICD-10-CM | POA: Diagnosis not present

## 2012-08-21 DIAGNOSIS — Z79899 Other long term (current) drug therapy: Secondary | ICD-10-CM | POA: Insufficient documentation

## 2012-08-21 DIAGNOSIS — I129 Hypertensive chronic kidney disease with stage 1 through stage 4 chronic kidney disease, or unspecified chronic kidney disease: Secondary | ICD-10-CM | POA: Diagnosis not present

## 2012-08-21 DIAGNOSIS — R112 Nausea with vomiting, unspecified: Secondary | ICD-10-CM | POA: Diagnosis not present

## 2012-08-21 DIAGNOSIS — K2970 Gastritis, unspecified, without bleeding: Secondary | ICD-10-CM | POA: Diagnosis not present

## 2012-08-21 DIAGNOSIS — Z7982 Long term (current) use of aspirin: Secondary | ICD-10-CM | POA: Insufficient documentation

## 2012-08-21 DIAGNOSIS — Z862 Personal history of diseases of the blood and blood-forming organs and certain disorders involving the immune mechanism: Secondary | ICD-10-CM | POA: Diagnosis not present

## 2012-08-21 DIAGNOSIS — Z8639 Personal history of other endocrine, nutritional and metabolic disease: Secondary | ICD-10-CM | POA: Insufficient documentation

## 2012-08-21 LAB — URINALYSIS, ROUTINE W REFLEX MICROSCOPIC
Bilirubin Urine: NEGATIVE
Glucose, UA: NEGATIVE mg/dL
Ketones, ur: NEGATIVE mg/dL
Nitrite: NEGATIVE
pH: 7 (ref 5.0–8.0)

## 2012-08-21 LAB — CBC WITH DIFFERENTIAL/PLATELET
Basophils Relative: 0 % (ref 0–1)
Hemoglobin: 11.2 g/dL — ABNORMAL LOW (ref 12.0–15.0)
Lymphocytes Relative: 20 % (ref 12–46)
Lymphs Abs: 1.2 10*3/uL (ref 0.7–4.0)
MCHC: 31.6 g/dL (ref 30.0–36.0)
Monocytes Relative: 8 % (ref 3–12)
Neutro Abs: 4.2 10*3/uL (ref 1.7–7.7)
Neutrophils Relative %: 71 % (ref 43–77)
RBC: 4.02 MIL/uL (ref 3.87–5.11)
WBC: 5.9 10*3/uL (ref 4.0–10.5)

## 2012-08-21 LAB — URINE MICROSCOPIC-ADD ON

## 2012-08-21 LAB — COMPREHENSIVE METABOLIC PANEL
Albumin: 3.9 g/dL (ref 3.5–5.2)
Alkaline Phosphatase: 84 U/L (ref 39–117)
BUN: 67 mg/dL — ABNORMAL HIGH (ref 6–23)
CO2: 27 mEq/L (ref 19–32)
Chloride: 100 mEq/L (ref 96–112)
Potassium: 3.9 mEq/L (ref 3.5–5.1)
Total Bilirubin: 0.3 mg/dL (ref 0.3–1.2)

## 2012-08-21 LAB — LIPASE, BLOOD: Lipase: 21 U/L (ref 11–59)

## 2012-08-21 MED ORDER — SODIUM CHLORIDE 0.9 % IV BOLUS (SEPSIS)
500.0000 mL | Freq: Once | INTRAVENOUS | Status: AC
  Administered 2012-08-21: 500 mL via INTRAVENOUS

## 2012-08-21 MED ORDER — PANTOPRAZOLE SODIUM 20 MG PO TBEC: 20.0000 mg | DELAYED_RELEASE_TABLET | Freq: Every day | ORAL | Status: DC

## 2012-08-21 MED ORDER — PANTOPRAZOLE SODIUM 40 MG IV SOLR
40.0000 mg | Freq: Once | INTRAVENOUS | Status: AC
  Administered 2012-08-21: 40 mg via INTRAVENOUS
  Filled 2012-08-21: qty 40

## 2012-08-21 MED ORDER — GI COCKTAIL ~~LOC~~
30.0000 mL | Freq: Once | ORAL | Status: AC
  Administered 2012-08-21: 30 mL via ORAL
  Filled 2012-08-21: qty 30

## 2012-08-21 MED ORDER — ONDANSETRON HCL 4 MG/2ML IJ SOLN
4.0000 mg | Freq: Once | INTRAMUSCULAR | Status: AC
  Administered 2012-08-21: 4 mg via INTRAVENOUS
  Filled 2012-08-21: qty 2

## 2012-08-21 MED ORDER — ONDANSETRON HCL 4 MG PO TABS: 4.0000 mg | ORAL_TABLET | Freq: Four times a day (QID) | ORAL | Status: DC

## 2012-08-21 NOTE — ED Notes (Signed)
Onset one day ago ate 2 hot dogs and developed nausea and emesis continued today. Denies pain.

## 2012-08-21 NOTE — ED Provider Notes (Signed)
History  This chart was scribed for Loren Racer, MD by Shari Heritage, ED Scribe. The patient was seen in room CD06C/CD06C. Patient's care was started at 1559.   CSN: [id number]  Arrival date & time 08/21/12  1519   First MD Initiated Contact with Patient 08/21/12 1559      Chief Complaint  Patient presents with  . Nausea  . Emesis    Patient is a 78 y.o. female presenting with vomiting. The history is provided by the patient. No language interpreter was used.  Emesis  This is a new problem. The current episode started yesterday. The problem occurs 2 to 4 times per day. The emesis has an appearance of stomach contents. There has been no fever. Pertinent negatives include no abdominal pain.    HPI Comments: Jackie [name] is a 78 y.o. female who presents to the Emergency Department complaining of nausea and emesis onset last night. Patient states that she ate 2 hot dogs for dinner yesterday, then 2 hours later she began vomiting. Patient reports 3 episodes of emesis since symptoms began. She had a BM yesterday which was normal. Patient says that she had cereal this morning for breakfast. Patient denies any abdominal pain or other pain at this time. She took Catering manager with some mild relief. She denies having taken any NSAIDs recently. She denies any known history of stomach diseases or abdominal surgeries. She has a medical history of HTN, diabetes, chronic kidney disease, thyroid disease and anemia.   Past Medical History  Diagnosis Date  . Hypertension   . Diabetes mellitus   . Chronic kidney disease   . Thyroid disease   . Anemia     Past Surgical History  Procedure Date  . Arteriovenous graft placement     Family History  Problem Relation Age of Onset  . Diabetes Mother   . Heart disease Mother   . Hypertension Mother     History  Substance Use Topics  . Smoking status: Never Smoker   . Smokeless tobacco: Not on file  . Alcohol Use: No    OB History    Grav  Para Term Preterm Abortions TAB SAB Ect Mult Living                  Review of Systems  Gastrointestinal: Positive for nausea and vomiting. Negative for abdominal pain.  All other systems reviewed and are negative.    Allergies  Review of patient's allergies indicates no known allergies.  Home Medications   Current Outpatient Rx  Name  Route  Sig  Dispense  Refill  . AMLODIPINE BESYLATE 10 MG PO TABS   Oral   Take 10 mg by mouth daily.           . ASPIRIN EC 81 MG PO TBEC   Oral   Take 81 mg by mouth daily.           . ASPIRIN EFFERVESCENT 325 MG PO TBEF   Oral   Take 325 mg by mouth every 6 (six) hours as needed. For pain         . TUMS ULTRA PO   Oral   Take 2 tablets by mouth daily as needed. For acid reflux         . IRON 240 (27 FE) MG PO TABS   Oral   Take 27 mg by mouth daily.           . FUROSEMIDE 80 MG PO TABS  Oral   Take 80 mg by mouth 2 (two) times daily.           . INSULIN ASPART 100 UNIT/ML [place] SOLN   Subcutaneous   Inject 1-15 Units into the skin daily.           . INSULIN DETEMIR 100 UNIT/ML [place] SOLN   Subcutaneous   Inject 15 Units into the skin at bedtime.         Marland Kitchen METOPROLOL SUCCINATE ER 50 MG PO TB24   Oral   Take 50 mg by mouth 2 (two) times daily.           Marland Kitchen PREGABALIN 50 MG PO CAPS   Oral   Take 50 mg by mouth 3 (three) times daily.           Marland Kitchen ONDANSETRON HCL 4 MG PO TABS   Oral   Take 1 tablet (4 mg total) by mouth every 6 (six) hours.   12 tablet   0   . PANTOPRAZOLE SODIUM 20 MG PO TBEC   Oral   Take 1 tablet (20 mg total) by mouth daily.   30 tablet   0     Triage Vitals: BP 204/88  Pulse 60  Temp 98.1 F (36.7 C) (Oral)  Resp 18  SpO2 100%  Physical Exam  Constitutional: She is oriented to person, place, and time. She appears well-developed and well-nourished. No distress.  HENT:  Head: Normocephalic and atraumatic.  Mouth/Throat: Oropharynx is clear and moist.  Eyes: Conjunctivae  normal and EOM are normal. Pupils are equal, round, and reactive to light.  Neck: Normal range of motion. Neck supple.  Cardiovascular: Normal rate and regular rhythm.   No murmur heard. Abdominal: Soft. There is tenderness in the epigastric area. There is no rebound and no guarding.       Epigastric tenderness to palpation. Rest of abdomen is nontender.  Neurological: She is alert and oriented to person, place, and time.  Skin: Skin is warm and dry.  Psychiatric: She has a normal mood and affect. Her behavior is normal.    ED Course  Procedures (including critical care time) DIAGNOSTIC STUDIES: Oxygen Saturation is 100% on room air, normal by my interpretation.    COORDINATION OF CARE: 4:30 PM- Will treat for gastritis. Patient informed of current plan for treatment and evaluation and agrees with plan at this time.   7:17 PM- Patient is significantly improved after IV fluids, GI cocktail, Protonix and Zofran. Patient reasonably evaluated and stable for discharge. Have prescribed Protonix 20 mg and Zofran 4 mg.    Labs Reviewed  CBC WITH DIFFERENTIAL - Abnormal; Notable for the following:    Hemoglobin 11.2 (*)     HCT 35.4 (*)     RDW 18.3 (*)     All other components within normal limits  COMPREHENSIVE METABOLIC PANEL - Abnormal; Notable for the following:    Glucose, Bld 163 (*)     BUN 67 (*)     Creatinine, Ser 5.58 (*)     Total Protein 9.1 (*)     GFR calc non Af Amer 7 (*)     GFR calc Af Amer 8 (*)     All other components within normal limits  URINALYSIS, ROUTINE W REFLEX MICROSCOPIC - Abnormal; Notable for the following:    Hgb urine dipstick SMALL (*)     Protein, ur >300 (*)     Leukocytes, UA TRACE (*)  All other components within normal limits  LIPASE, BLOOD  TROPONIN I  URINE MICROSCOPIC-ADD ON     No results found.   1. Gastritis       MDM  I personally performed the services described in this documentation, which was scribed in my presence.  The recorded information has been reviewed and is accurate.  Pt with known renal insuff being followed by PMD.  Pt advised to f/u with GI for persistent symptoms.  Return for worsening symptoms or any concerns  Loren Racer, MD 08/21/12 [facility] [place]

## 2012-08-26 ENCOUNTER — Encounter (HOSPITAL_COMMUNITY)
Admission: RE | Admit: 2012-08-26 | Discharge: 2012-08-26 | Disposition: A | Discharge status: Home / Self Care | Payer: Medicare Other | Source: Ambulatory Visit | Attending: Nephrology | Admitting: Nephrology

## 2012-08-26 DIAGNOSIS — N184 Chronic kidney disease, stage 4 (severe): Secondary | ICD-10-CM | POA: Insufficient documentation

## 2012-08-26 DIAGNOSIS — D638 Anemia in other chronic diseases classified elsewhere: Secondary | ICD-10-CM | POA: Insufficient documentation

## 2012-08-26 LAB — IRON AND TIBC
Iron: 34 ug/dL — ABNORMAL LOW (ref 42–135)
TIBC: 174 ug/dL — ABNORMAL LOW (ref 250–470)

## 2012-08-26 MED ORDER — EPOETIN ALFA 40000 UNIT/ML IJ SOLN
INTRAMUSCULAR | Status: AC
  Administered 2012-08-26: 40000 [IU] via SUBCUTANEOUS
  Filled 2012-08-26: qty 1

## 2012-08-26 MED ORDER — EPOETIN ALFA 10000 UNIT/ML IJ SOLN: 40000.0000 [IU] | INTRAMUSCULAR | Status: DC

## 2012-09-05 DIAGNOSIS — N184 Chronic kidney disease, stage 4 (severe): Secondary | ICD-10-CM | POA: Diagnosis not present

## 2012-09-05 DIAGNOSIS — A499 Bacterial infection, unspecified: Secondary | ICD-10-CM | POA: Diagnosis not present

## 2012-09-05 DIAGNOSIS — N185 Chronic kidney disease, stage 5: Secondary | ICD-10-CM | POA: Diagnosis not present

## 2012-09-05 DIAGNOSIS — I10 Essential (primary) hypertension: Secondary | ICD-10-CM | POA: Diagnosis not present

## 2012-09-05 DIAGNOSIS — I120 Hypertensive chronic kidney disease with stage 5 chronic kidney disease or end stage renal disease: Secondary | ICD-10-CM | POA: Diagnosis not present

## 2012-09-05 DIAGNOSIS — N390 Urinary tract infection, site not specified: Secondary | ICD-10-CM | POA: Diagnosis not present

## 2012-09-05 DIAGNOSIS — E119 Type 2 diabetes mellitus without complications: Secondary | ICD-10-CM | POA: Diagnosis not present

## 2012-09-05 DIAGNOSIS — D649 Anemia, unspecified: Secondary | ICD-10-CM | POA: Diagnosis not present

## 2012-09-05 DIAGNOSIS — N2581 Secondary hyperparathyroidism of renal origin: Secondary | ICD-10-CM | POA: Diagnosis not present

## 2012-09-05 DIAGNOSIS — E213 Hyperparathyroidism, unspecified: Secondary | ICD-10-CM | POA: Diagnosis not present

## 2012-09-08 ENCOUNTER — Other Ambulatory Visit (HOSPITAL_COMMUNITY): Payer: Self-pay | Admitting: *Deleted

## 2012-09-09 ENCOUNTER — Encounter (HOSPITAL_COMMUNITY)
Admission: RE | Admit: 2012-09-09 | Discharge: 2012-09-09 | Disposition: A | Discharge status: Home / Self Care | Payer: Medicare Other | Source: Ambulatory Visit | Attending: Nephrology | Admitting: Nephrology

## 2012-09-09 MED ORDER — FERUMOXYTOL INJECTION 510 MG/17 ML: 510.0000 mg | Freq: Once | INTRAVENOUS | Status: DC

## 2012-09-09 MED ORDER — SODIUM CHLORIDE 0.9 % IV SOLN
Freq: Once | INTRAVENOUS | Status: AC
  Administered 2012-09-09: 14:00:00 via INTRAVENOUS

## 2012-09-09 MED ORDER — EPOETIN ALFA 10000 UNIT/ML IJ SOLN: 40000.0000 [IU] | INTRAMUSCULAR | Status: DC

## 2012-09-09 MED ORDER — FERUMOXYTOL INJECTION 510 MG/17 ML
INTRAVENOUS | Status: AC
  Administered 2012-09-09: 510 mg
  Filled 2012-09-09: qty 17

## 2012-09-20 DIAGNOSIS — I10 Essential (primary) hypertension: Secondary | ICD-10-CM | POA: Diagnosis not present

## 2012-09-20 DIAGNOSIS — N181 Chronic kidney disease, stage 1: Secondary | ICD-10-CM | POA: Diagnosis not present

## 2012-09-23 ENCOUNTER — Inpatient Hospital Stay (HOSPITAL_COMMUNITY): Admission: RE | Admit: 2012-09-23 | Payer: Medicare Other | Source: Ambulatory Visit

## 2012-10-07 ENCOUNTER — Encounter (HOSPITAL_COMMUNITY)
Admission: RE | Admit: 2012-10-07 | Discharge: 2012-10-07 | Disposition: A | Discharge status: Home / Self Care | Payer: Medicare Other | Source: Ambulatory Visit | Attending: Nephrology | Admitting: Nephrology

## 2012-10-07 DIAGNOSIS — N184 Chronic kidney disease, stage 4 (severe): Secondary | ICD-10-CM | POA: Diagnosis not present

## 2012-10-07 DIAGNOSIS — D638 Anemia in other chronic diseases classified elsewhere: Secondary | ICD-10-CM | POA: Diagnosis not present

## 2012-10-07 MED ORDER — EPOETIN ALFA 40000 UNIT/ML IJ SOLN
INTRAMUSCULAR | Status: AC
  Administered 2012-10-07: 40000 [IU] via SUBCUTANEOUS
  Filled 2012-10-07: qty 1

## 2012-10-07 MED ORDER — EPOETIN ALFA 10000 UNIT/ML IJ SOLN: 40000.0000 [IU] | INTRAMUSCULAR | Status: DC

## 2012-10-08 LAB — IRON AND TIBC
Saturation Ratios: 50 % (ref 20–55)
TIBC: 167 ug/dL — ABNORMAL LOW (ref 250–470)

## 2012-10-14 DIAGNOSIS — D649 Anemia, unspecified: Secondary | ICD-10-CM | POA: Diagnosis not present

## 2012-10-14 DIAGNOSIS — E119 Type 2 diabetes mellitus without complications: Secondary | ICD-10-CM | POA: Diagnosis not present

## 2012-10-14 DIAGNOSIS — N390 Urinary tract infection, site not specified: Secondary | ICD-10-CM | POA: Diagnosis not present

## 2012-10-14 DIAGNOSIS — E213 Hyperparathyroidism, unspecified: Secondary | ICD-10-CM | POA: Diagnosis not present

## 2012-10-14 DIAGNOSIS — I10 Essential (primary) hypertension: Secondary | ICD-10-CM | POA: Diagnosis not present

## 2012-10-14 DIAGNOSIS — N185 Chronic kidney disease, stage 5: Secondary | ICD-10-CM | POA: Diagnosis not present

## 2012-11-04 ENCOUNTER — Encounter (HOSPITAL_COMMUNITY)
Admission: RE | Admit: 2012-11-04 | Discharge: 2012-11-04 | Disposition: A | Discharge status: Home / Self Care | Payer: Medicare Other | Source: Ambulatory Visit | Attending: Nephrology | Admitting: Nephrology

## 2012-11-04 DIAGNOSIS — D638 Anemia in other chronic diseases classified elsewhere: Secondary | ICD-10-CM | POA: Diagnosis not present

## 2012-11-04 DIAGNOSIS — N184 Chronic kidney disease, stage 4 (severe): Secondary | ICD-10-CM | POA: Insufficient documentation

## 2012-11-04 LAB — POCT HEMOGLOBIN-HEMACUE: Hemoglobin: 11.5 g/dL — ABNORMAL LOW (ref 12.0–15.0)

## 2012-11-04 MED ORDER — EPOETIN ALFA 10000 UNIT/ML IJ SOLN: 40000.0000 [IU] | INTRAMUSCULAR | Status: DC

## 2012-11-04 MED ORDER — EPOETIN ALFA 40000 UNIT/ML IJ SOLN
INTRAMUSCULAR | Status: AC
  Administered 2012-11-04: 40000 [IU] via SUBCUTANEOUS
  Filled 2012-11-04: qty 1

## 2012-11-24 DIAGNOSIS — D649 Anemia, unspecified: Secondary | ICD-10-CM | POA: Diagnosis not present

## 2012-11-24 DIAGNOSIS — N390 Urinary tract infection, site not specified: Secondary | ICD-10-CM | POA: Diagnosis not present

## 2012-11-24 DIAGNOSIS — I120 Hypertensive chronic kidney disease with stage 5 chronic kidney disease or end stage renal disease: Secondary | ICD-10-CM | POA: Diagnosis not present

## 2012-11-24 DIAGNOSIS — N2581 Secondary hyperparathyroidism of renal origin: Secondary | ICD-10-CM | POA: Diagnosis not present

## 2012-11-24 DIAGNOSIS — E213 Hyperparathyroidism, unspecified: Secondary | ICD-10-CM | POA: Diagnosis not present

## 2012-11-24 DIAGNOSIS — I10 Essential (primary) hypertension: Secondary | ICD-10-CM | POA: Diagnosis not present

## 2012-11-24 DIAGNOSIS — N185 Chronic kidney disease, stage 5: Secondary | ICD-10-CM | POA: Diagnosis not present

## 2012-11-28 DIAGNOSIS — E213 Hyperparathyroidism, unspecified: Secondary | ICD-10-CM | POA: Diagnosis not present

## 2012-11-28 DIAGNOSIS — N184 Chronic kidney disease, stage 4 (severe): Secondary | ICD-10-CM | POA: Diagnosis not present

## 2012-12-01 ENCOUNTER — Other Ambulatory Visit (HOSPITAL_COMMUNITY): Payer: Self-pay | Admitting: *Deleted

## 2012-12-02 ENCOUNTER — Encounter (HOSPITAL_COMMUNITY)
Admission: RE | Admit: 2012-12-02 | Discharge: 2012-12-02 | Disposition: A | Discharge status: Home / Self Care | Payer: Medicare Other | Source: Ambulatory Visit | Attending: Nephrology | Admitting: Nephrology

## 2012-12-02 DIAGNOSIS — D638 Anemia in other chronic diseases classified elsewhere: Secondary | ICD-10-CM | POA: Insufficient documentation

## 2012-12-02 DIAGNOSIS — N184 Chronic kidney disease, stage 4 (severe): Secondary | ICD-10-CM | POA: Insufficient documentation

## 2012-12-02 LAB — IRON AND TIBC
Iron: 85 ug/dL (ref 42–135)
Saturation Ratios: 52 % (ref 20–55)
UIBC: 80 ug/dL — ABNORMAL LOW (ref 125–400)

## 2012-12-02 MED ORDER — EPOETIN ALFA 20000 UNIT/ML IJ SOLN
INTRAMUSCULAR | Status: AC
  Filled 2012-12-02: qty 1

## 2012-12-02 MED ORDER — EPOETIN ALFA 10000 UNIT/ML IJ SOLN
INTRAMUSCULAR | Status: AC
  Administered 2012-12-02: 10000 [IU]
  Filled 2012-12-02: qty 1

## 2012-12-02 MED ORDER — EPOETIN ALFA 20000 UNIT/ML IJ SOLN
INTRAMUSCULAR | Status: AC
  Administered 2012-12-02: 20000 [IU]
  Filled 2012-12-02: qty 1

## 2012-12-02 MED ORDER — EPOETIN ALFA 10000 UNIT/ML IJ SOLN: 30000.0000 [IU] | INTRAMUSCULAR | Status: DC

## 2012-12-19 DIAGNOSIS — N181 Chronic kidney disease, stage 1: Secondary | ICD-10-CM | POA: Diagnosis not present

## 2012-12-19 DIAGNOSIS — E119 Type 2 diabetes mellitus without complications: Secondary | ICD-10-CM | POA: Diagnosis not present

## 2012-12-19 DIAGNOSIS — D649 Anemia, unspecified: Secondary | ICD-10-CM | POA: Diagnosis not present

## 2012-12-19 DIAGNOSIS — I10 Essential (primary) hypertension: Secondary | ICD-10-CM | POA: Diagnosis not present

## 2012-12-26 ENCOUNTER — Emergency Department (HOSPITAL_COMMUNITY): Payer: Medicare Other

## 2012-12-26 ENCOUNTER — Emergency Department (HOSPITAL_COMMUNITY)
Admission: EM | Admit: 2012-12-26 | Discharge: 2012-12-27 | Disposition: A | Discharge status: Home / Self Care | Payer: Medicare Other | Attending: Emergency Medicine | Admitting: Emergency Medicine

## 2012-12-26 ENCOUNTER — Encounter (HOSPITAL_COMMUNITY): Payer: Self-pay | Admitting: Adult Health

## 2012-12-26 DIAGNOSIS — Z79899 Other long term (current) drug therapy: Secondary | ICD-10-CM | POA: Diagnosis not present

## 2012-12-26 DIAGNOSIS — D649 Anemia, unspecified: Secondary | ICD-10-CM | POA: Diagnosis not present

## 2012-12-26 DIAGNOSIS — Z862 Personal history of diseases of the blood and blood-forming organs and certain disorders involving the immune mechanism: Secondary | ICD-10-CM | POA: Insufficient documentation

## 2012-12-26 DIAGNOSIS — Z7982 Long term (current) use of aspirin: Secondary | ICD-10-CM | POA: Insufficient documentation

## 2012-12-26 DIAGNOSIS — I129 Hypertensive chronic kidney disease with stage 1 through stage 4 chronic kidney disease, or unspecified chronic kidney disease: Secondary | ICD-10-CM | POA: Diagnosis not present

## 2012-12-26 DIAGNOSIS — R0602 Shortness of breath: Secondary | ICD-10-CM | POA: Diagnosis not present

## 2012-12-26 DIAGNOSIS — Z794 Long term (current) use of insulin: Secondary | ICD-10-CM | POA: Insufficient documentation

## 2012-12-26 DIAGNOSIS — Z8639 Personal history of other endocrine, nutritional and metabolic disease: Secondary | ICD-10-CM | POA: Insufficient documentation

## 2012-12-26 DIAGNOSIS — N189 Chronic kidney disease, unspecified: Secondary | ICD-10-CM | POA: Insufficient documentation

## 2012-12-26 DIAGNOSIS — IMO0002 Reserved for concepts with insufficient information to code with codable children: Secondary | ICD-10-CM | POA: Diagnosis not present

## 2012-12-26 DIAGNOSIS — J209 Acute bronchitis, unspecified: Secondary | ICD-10-CM | POA: Insufficient documentation

## 2012-12-26 DIAGNOSIS — R05 Cough: Secondary | ICD-10-CM | POA: Diagnosis not present

## 2012-12-26 DIAGNOSIS — E119 Type 2 diabetes mellitus without complications: Secondary | ICD-10-CM | POA: Insufficient documentation

## 2012-12-26 DIAGNOSIS — J40 Bronchitis, not specified as acute or chronic: Secondary | ICD-10-CM

## 2012-12-26 LAB — CBC
MCH: 29.3 pg (ref 26.0–34.0)
MCHC: 33.1 g/dL (ref 30.0–36.0)
MCV: 88.5 fL (ref 78.0–100.0)
Platelets: 302 10*3/uL (ref 150–400)
RBC: 4 MIL/uL (ref 3.87–5.11)

## 2012-12-26 NOTE — ED Notes (Signed)
Presents with one week of productive cough, white phlegm, worse when lying flat, decrease in appetite. Denies CHest pain, denies SOB. Pt reports that she has renal failure but is not on dialysis. Bilateral breath sounds clear.

## 2012-12-27 LAB — BASIC METABOLIC PANEL
CO2: 28 mEq/L (ref 19–32)
Calcium: 9.7 mg/dL (ref 8.4–10.5)
Creatinine, Ser: 6.82 mg/dL — ABNORMAL HIGH (ref 0.50–1.10)
GFR calc non Af Amer: 5 mL/min — ABNORMAL LOW (ref >90)

## 2012-12-27 MED ORDER — PREDNISONE 20 MG PO TABS
60.0000 mg | ORAL_TABLET | Freq: Once | ORAL | Status: AC
  Administered 2012-12-27: 60 mg via ORAL
  Filled 2012-12-27: qty 3

## 2012-12-27 MED ORDER — ALBUTEROL SULFATE HFA 108 (90 BASE) MCG/ACT IN AERS: 1.0000 | INHALATION_SPRAY | Freq: Four times a day (QID) | RESPIRATORY_TRACT | Status: AC | PRN

## 2012-12-27 MED ORDER — PREDNISONE 20 MG PO TABS: ORAL_TABLET | ORAL | Status: DC

## 2012-12-27 MED ORDER — AZITHROMYCIN 250 MG PO TABS: ORAL_TABLET | ORAL | Status: DC

## 2012-12-27 MED ORDER — IPRATROPIUM BROMIDE 0.02 % IN SOLN
0.5000 mg | Freq: Once | RESPIRATORY_TRACT | Status: AC
  Administered 2012-12-27: 0.5 mg via RESPIRATORY_TRACT
  Filled 2012-12-27: qty 2.5

## 2012-12-27 MED ORDER — ALBUTEROL SULFATE (5 MG/ML) 0.5% IN NEBU
5.0000 mg | INHALATION_SOLUTION | Freq: Once | RESPIRATORY_TRACT | Status: AC
  Administered 2012-12-27: 5 mg via RESPIRATORY_TRACT
  Filled 2012-12-27: qty 1

## 2012-12-27 MED ORDER — GUAIFENESIN ER 600 MG PO TB12: 1200.0000 mg | ORAL_TABLET | Freq: Two times a day (BID) | ORAL | Status: DC

## 2012-12-27 MED ORDER — AZITHROMYCIN 250 MG PO TABS
500.0000 mg | ORAL_TABLET | Freq: Once | ORAL | Status: AC
  Administered 2012-12-27: 500 mg via ORAL
  Filled 2012-12-27: qty 2

## 2012-12-27 NOTE — ED Provider Notes (Signed)
History     CSN: [id number]  Arrival date & time 12/26/12  2305   First MD Initiated Contact with Patient 12/27/12 0018      Chief Complaint  Patient presents with  . Cough    (Consider location/radiation/quality/duration/timing/severity/associated sxs/prior treatment) Patient is a 78 y.o. female presenting with cough. The history is provided by the patient.  Cough Cough characteristics:  Productive Sputum characteristics:  White Severity:  Moderate Onset quality:  Gradual Timing:  Constant Progression:  Unchanged Chronicity:  New Smoker: no   Context: upper respiratory infection   Relieved by:  Nothing Worsened by:  Nothing tried Ineffective treatments:  None tried Associated symptoms: no chest pain, no fever and no weight loss     Past Medical History  Diagnosis Date  . Hypertension   . Diabetes mellitus   . Chronic kidney disease   . Thyroid disease   . Anemia     Past Surgical History  Procedure Laterality Date  . Arteriovenous graft placement      Family History  Problem Relation Age of Onset  . Diabetes Mother   . Heart disease Mother   . Hypertension Mother     History  Substance Use Topics  . Smoking status: Never Smoker   . Smokeless tobacco: Not on file  . Alcohol Use: No    OB History   Grav Para Term Preterm Abortions TAB SAB Ect Mult Living                  Review of Systems  Constitutional: Negative for fever and weight loss.  Respiratory: Positive for cough.   Cardiovascular: Negative for chest pain.  All other systems reviewed and are negative.    Allergies  Review of patient's allergies indicates no known allergies.  Home Medications   Current Outpatient Rx  Name  Route  Sig  Dispense  Refill  . amLODipine (NORVASC) 10 MG tablet   Oral   Take 10 mg by mouth daily.           Marland Kitchen aspirin 325 MG tablet   Oral   Take 325 mg by mouth daily.         . calcitRIOL (ROCALTROL) 0.25 MCG capsule   Oral   Take 0.25 mcg  by mouth daily.         . Calcium Carbonate Antacid (TUMS ULTRA PO)   Oral   Take 2 tablets by mouth daily as needed. For acid reflux         . Ferrous Gluconate (IRON) 240 (27 FE) MG TABS   Oral   Take 27 mg by mouth daily.           . furosemide (LASIX) 80 MG tablet   Oral   Take 80 mg by mouth 2 (two) times daily.           . insulin detemir (LEVEMIR) 100 UNIT/ML injection   Subcutaneous   Inject 15 Units into the skin at bedtime.         . metoprolol (LOPRESSOR) 50 MG tablet   Oral   Take 50 mg by mouth 2 (two) times daily.         . mometasone (NASONEX) 50 MCG/ACT nasal spray   Nasal   Place 2 sprays into the nose daily.         . pregabalin (LYRICA) 50 MG capsule   Oral   Take 50 mg by mouth 3 (three) times daily as needed (for pain).          Marland Kitchen  insulin aspart (NOVOLOG) 100 UNIT/ML injection   Subcutaneous   Inject 1-15 Units into the skin daily.             BP 112/63  Pulse 66  Temp(Src) 98.8 F (37.1 C) (Oral)  Resp 22  SpO2 97%  Physical Exam  Constitutional: She appears well-developed and well-nourished. No distress.  HENT:  Head: Normocephalic and atraumatic.  Mouth/Throat: Oropharynx is clear and moist.  Eyes: Conjunctivae are normal. Pupils are equal, round, and reactive to light.  Neck: Normal range of motion. Neck supple.  Cardiovascular: Normal rate and regular rhythm.   Pulmonary/Chest: She has decreased breath sounds.  Abdominal: Soft. Bowel sounds are normal. There is no tenderness. There is no rebound and no guarding.  Musculoskeletal: Normal range of motion.  Neurological: She is alert.  Skin: Skin is warm and dry.  Psychiatric: She has a normal mood and affect.    ED Course  Procedures (including critical care time)  Labs Reviewed  CBC - Abnormal; Notable for the following:    Hemoglobin 11.7 (*)    HCT 35.4 (*)    All other components within normal limits  BASIC METABOLIC PANEL   No results found.   No  diagnosis found.    MDM  Lung sounds clear post neb, will treat with inhaler steroids and zpak.  Follow up with your family doctor return for worsening symptoms call your kidney doctor regarding change in creatinine        Avantae Bither K Jearline Hirschhorn-Rasch, MD 12/27/12 1610

## 2012-12-30 ENCOUNTER — Encounter (HOSPITAL_COMMUNITY)
Admission: RE | Admit: 2012-12-30 | Discharge: 2012-12-30 | Disposition: A | Discharge status: Home / Self Care | Payer: Medicare Other | Source: Ambulatory Visit | Attending: Nephrology | Admitting: Nephrology

## 2012-12-30 DIAGNOSIS — N184 Chronic kidney disease, stage 4 (severe): Secondary | ICD-10-CM | POA: Insufficient documentation

## 2012-12-30 DIAGNOSIS — D638 Anemia in other chronic diseases classified elsewhere: Secondary | ICD-10-CM | POA: Diagnosis not present

## 2012-12-30 LAB — POCT HEMOGLOBIN-HEMACUE: Hemoglobin: 10.4 g/dL — ABNORMAL LOW (ref 12.0–15.0)

## 2012-12-30 LAB — IRON AND TIBC: Iron: 134 ug/dL (ref 42–135)

## 2012-12-30 MED ORDER — EPOETIN ALFA 20000 UNIT/ML IJ SOLN
INTRAMUSCULAR | Status: AC
  Administered 2012-12-30: 20000 [IU] via SUBCUTANEOUS
  Filled 2012-12-30: qty 1

## 2012-12-30 MED ORDER — EPOETIN ALFA 10000 UNIT/ML IJ SOLN
INTRAMUSCULAR | Status: AC
  Administered 2012-12-30: 10000 [IU] via SUBCUTANEOUS
  Filled 2012-12-30: qty 1

## 2012-12-30 MED ORDER — EPOETIN ALFA 10000 UNIT/ML IJ SOLN: 30000.0000 [IU] | INTRAMUSCULAR | Status: DC

## 2013-01-08 ENCOUNTER — Emergency Department (HOSPITAL_COMMUNITY): Payer: Medicare Other

## 2013-01-08 ENCOUNTER — Other Ambulatory Visit: Payer: Self-pay

## 2013-01-08 ENCOUNTER — Encounter (HOSPITAL_COMMUNITY): Payer: Self-pay | Admitting: Neurology

## 2013-01-08 ENCOUNTER — Emergency Department (HOSPITAL_COMMUNITY)
Admission: EM | Admit: 2013-01-08 | Discharge: 2013-01-08 | Disposition: A | Discharge status: Home / Self Care | Payer: Medicare Other | Attending: Emergency Medicine | Admitting: Emergency Medicine

## 2013-01-08 DIAGNOSIS — F411 Generalized anxiety disorder: Secondary | ICD-10-CM | POA: Insufficient documentation

## 2013-01-08 DIAGNOSIS — R0989 Other specified symptoms and signs involving the circulatory and respiratory systems: Secondary | ICD-10-CM | POA: Insufficient documentation

## 2013-01-08 DIAGNOSIS — Z79899 Other long term (current) drug therapy: Secondary | ICD-10-CM | POA: Diagnosis not present

## 2013-01-08 DIAGNOSIS — Z7982 Long term (current) use of aspirin: Secondary | ICD-10-CM | POA: Diagnosis not present

## 2013-01-08 DIAGNOSIS — R6889 Other general symptoms and signs: Secondary | ICD-10-CM | POA: Diagnosis not present

## 2013-01-08 DIAGNOSIS — Z862 Personal history of diseases of the blood and blood-forming organs and certain disorders involving the immune mechanism: Secondary | ICD-10-CM | POA: Insufficient documentation

## 2013-01-08 DIAGNOSIS — D649 Anemia, unspecified: Secondary | ICD-10-CM | POA: Insufficient documentation

## 2013-01-08 DIAGNOSIS — R259 Unspecified abnormal involuntary movements: Secondary | ICD-10-CM | POA: Diagnosis not present

## 2013-01-08 DIAGNOSIS — I120 Hypertensive chronic kidney disease with stage 5 chronic kidney disease or end stage renal disease: Secondary | ICD-10-CM | POA: Insufficient documentation

## 2013-01-08 DIAGNOSIS — IMO0002 Reserved for concepts with insufficient information to code with codable children: Secondary | ICD-10-CM | POA: Insufficient documentation

## 2013-01-08 DIAGNOSIS — R0609 Other forms of dyspnea: Secondary | ICD-10-CM | POA: Insufficient documentation

## 2013-01-08 DIAGNOSIS — N185 Chronic kidney disease, stage 5: Secondary | ICD-10-CM | POA: Diagnosis not present

## 2013-01-08 DIAGNOSIS — Z794 Long term (current) use of insulin: Secondary | ICD-10-CM | POA: Insufficient documentation

## 2013-01-08 DIAGNOSIS — R51 Headache: Secondary | ICD-10-CM | POA: Diagnosis not present

## 2013-01-08 DIAGNOSIS — R569 Unspecified convulsions: Secondary | ICD-10-CM | POA: Diagnosis not present

## 2013-01-08 DIAGNOSIS — E119 Type 2 diabetes mellitus without complications: Secondary | ICD-10-CM | POA: Diagnosis not present

## 2013-01-08 DIAGNOSIS — Z8639 Personal history of other endocrine, nutritional and metabolic disease: Secondary | ICD-10-CM | POA: Insufficient documentation

## 2013-01-08 DIAGNOSIS — R064 Hyperventilation: Secondary | ICD-10-CM | POA: Diagnosis not present

## 2013-01-08 DIAGNOSIS — F419 Anxiety disorder, unspecified: Secondary | ICD-10-CM

## 2013-01-08 DIAGNOSIS — R0602 Shortness of breath: Secondary | ICD-10-CM | POA: Diagnosis not present

## 2013-01-08 LAB — CBC WITH DIFFERENTIAL/PLATELET
Eosinophils Absolute: 0 10*3/uL (ref 0.0–0.7)
Eosinophils Relative: 0 % (ref 0–5)
Hemoglobin: 11.7 g/dL — ABNORMAL LOW (ref 12.0–15.0)
Lymphs Abs: 0.8 10*3/uL (ref 0.7–4.0)
MCH: 28.6 pg (ref 26.0–34.0)
MCV: 87.5 fL (ref 78.0–100.0)
Monocytes Relative: 8 % (ref 3–12)
Neutrophils Relative %: 81 % — ABNORMAL HIGH (ref 43–77)
RBC: 4.09 MIL/uL (ref 3.87–5.11)

## 2013-01-08 LAB — URINALYSIS, ROUTINE W REFLEX MICROSCOPIC
Glucose, UA: NEGATIVE mg/dL
Protein, ur: >300 mg/dL — AB

## 2013-01-08 LAB — BASIC METABOLIC PANEL
BUN: 80 mg/dL — ABNORMAL HIGH (ref 6–23)
GFR calc non Af Amer: 7 mL/min — ABNORMAL LOW (ref >90)
Glucose, Bld: 215 mg/dL — ABNORMAL HIGH (ref 70–99)
Potassium: 4 mEq/L (ref 3.5–5.1)

## 2013-01-08 LAB — POCT I-STAT 3, ART BLOOD GAS (G3+)
Acid-Base Excess: 8 mmol/L — ABNORMAL HIGH (ref 0.0–2.0)
Bicarbonate: 28.6 mEq/L — ABNORMAL HIGH (ref 20.0–24.0)
pCO2 arterial: 27 mmHg — ABNORMAL LOW (ref 35.0–45.0)
pCO2 arterial: 36.5 mmHg (ref 35.0–45.0)
pH, Arterial: 7.632 (ref 7.350–7.450)
pH, Arterial: 7.516 — ABNORMAL HIGH (ref 7.350–7.450)
pO2, Arterial: 107 mmHg — ABNORMAL HIGH (ref 80.0–100.0)

## 2013-01-08 LAB — SALICYLATE LEVEL: Salicylate Lvl: <2 mg/dL — ABNORMAL LOW (ref 2.8–20.0)

## 2013-01-08 LAB — GLUCOSE, CAPILLARY: Glucose-Capillary: 216 mg/dL — ABNORMAL HIGH (ref 70–99)

## 2013-01-08 LAB — PRO B NATRIURETIC PEPTIDE: Pro B Natriuretic peptide (BNP): 2385 pg/mL — ABNORMAL HIGH (ref 0–450)

## 2013-01-08 LAB — TROPONIN I: Troponin I: <0.3 ng/mL (ref <0.30)

## 2013-01-08 LAB — URINE MICROSCOPIC-ADD ON

## 2013-01-08 MED ORDER — SODIUM CHLORIDE 0.9 % IV SOLN
Freq: Once | INTRAVENOUS | Status: AC
  Administered 2013-01-08: 12:00:00 via INTRAVENOUS

## 2013-01-08 NOTE — ED Provider Notes (Addendum)
History     CSN: [id number]  Arrival date & time 01/08/13  7829   First MD Initiated Contact with Patient 01/08/13 [phone]      Chief Complaint  Patient presents with  . Anxiety    (Consider location/radiation/quality/duration/timing/severity/associated sxs/prior treatment) HPI Comments: Pt comes in with cc of DIB. Pt has hx of HTN, DM, CKD. She reports getting ready for church, when she had a sudden episode of dyspnea. She had difficulty breathing and had to call neighbors. Pt more comfortable at my evaluation, slightly tachypnea  - RR- 24, but in no true distress. States that she had bronchitis few weeks ago, but wasn't wheezing today. She denies chest pain, lightheadedness, diaphoresis, nausea, or vomiting. She denies recent fevers. She denies dysuria or increased urinary frequency. She has no history of COPD or asthma. Patient was somnolent but responsive during interview.  The history is provided by the patient.    Past Medical History  Diagnosis Date  . Hypertension   . Diabetes mellitus   . Chronic kidney disease   . Thyroid disease   . Anemia     Past Surgical History  Procedure Laterality Date  . Arteriovenous graft placement      Family History  Problem Relation Age of Onset  . Diabetes Mother   . Heart disease Mother   . Hypertension Mother     History  Substance Use Topics  . Smoking status: Never Smoker   . Smokeless tobacco: Not on file  . Alcohol Use: No    OB History   Grav Para Term Preterm Abortions TAB SAB Ect Mult Living                  Review of Systems  Constitutional: Negative for fever and activity change.  HENT: Negative for neck pain.   Eyes: Negative for visual disturbance.  Respiratory: Positive for shortness of breath. Negative for cough, wheezing and stridor.   Cardiovascular: Negative for chest pain.  Gastrointestinal: Negative for nausea, vomiting, abdominal pain, diarrhea, constipation, blood in stool and abdominal  distention.  Genitourinary: Negative for hematuria and difficulty urinating.  Skin: Negative for color change.  Neurological: Negative for dizziness, speech difficulty, weakness and light-headedness.  Hematological: Does not bruise/bleed easily.  Psychiatric/Behavioral: Negative for confusion.    Allergies  Review of patient's allergies indicates no known allergies.  Home Medications   Current Outpatient Rx  Name  Route  Sig  Dispense  Refill  . albuterol (PROVENTIL HFA;VENTOLIN HFA) 108 (90 BASE) MCG/ACT inhaler   Inhalation   Inhale 1-2 puffs into the lungs every 6 (six) hours as needed for wheezing.   1 Inhaler   0   . amLODipine (NORVASC) 10 MG tablet   Oral   Take 10 mg by mouth daily.           Marland Kitchen aspirin 325 MG tablet   Oral   Take 325 mg by mouth daily.         . Aspirin Effervescent (ALKA-SELTZER PO)   Oral   Take 1 tablet by mouth as needed.         . calcitRIOL (ROCALTROL) 0.25 MCG capsule   Oral   Take 0.25 mcg by mouth daily.         . calcitRIOL (ROCALTROL) 0.5 MCG capsule   Oral   Take 0.5 mcg by mouth daily.         . Calcium Carbonate Antacid (TUMS ULTRA PO)   Oral   Take  2 tablets by mouth daily as needed. For acid reflux         . Ferrous Gluconate (IRON) 240 (27 FE) MG TABS   Oral   Take 27 mg by mouth daily.           . furosemide (LASIX) 80 MG tablet   Oral   Take 80 mg by mouth 2 (two) times daily.           . insulin aspart (NOVOLOG) 100 UNIT/ML injection   Subcutaneous   Inject 1-15 Units into the skin daily.           . insulin detemir (LEVEMIR) 100 UNIT/ML injection   Subcutaneous   Inject 15 Units into the skin at bedtime.         . metoprolol (LOPRESSOR) 50 MG tablet   Oral   Take 50 mg by mouth 2 (two) times daily.         . mometasone (NASONEX) 50 MCG/ACT nasal spray   Nasal   Place 2 sprays into the nose daily.         . pregabalin (LYRICA) 50 MG capsule   Oral   Take 50 mg by mouth 3 (three)  times daily as needed (for pain).            BP 135/105  Pulse 70  Temp(Src) 98.2 F (36.8 C) (Oral)  Resp 17  SpO2 100%  Physical Exam  Nursing note and vitals reviewed. Constitutional: She is oriented to person, place, and time. She appears well-developed and well-nourished.  HENT:  Head: Normocephalic and atraumatic.  Eyes: EOM are normal. Pupils are equal, round, and reactive to light.  Neck: Neck supple. No JVD present.  Cardiovascular: Normal rate, regular rhythm and normal heart sounds.   Pulmonary/Chest: Effort normal. No respiratory distress. She has no wheezes.  Bibasilar crakles  Abdominal: Soft. She exhibits no distension. There is no tenderness. There is no rebound and no guarding.  Musculoskeletal: She exhibits no edema and no tenderness.  Neurological: She is alert and oriented to person, place, and time.  Skin: Skin is warm and dry.    ED Course  Procedures (including critical care time)  Labs Reviewed  GLUCOSE, CAPILLARY - Abnormal; Notable for the following:    Glucose-Capillary 216 (*)    All other components within normal limits  CBC WITH DIFFERENTIAL - Abnormal; Notable for the following:    Hemoglobin 11.7 (*)    HCT 35.8 (*)    RDW 15.9 (*)    Neutrophils Relative % 81 (*)    Lymphocytes Relative 11 (*)    All other components within normal limits  BASIC METABOLIC PANEL - Abnormal; Notable for the following:    Chloride 95 (*)    Glucose, Bld 215 (*)    BUN 80 (*)    Creatinine, Ser 5.39 (*)    GFR calc non Af Amer 7 (*)    GFR calc Af Amer 8 (*)    All other components within normal limits  PRO B NATRIURETIC PEPTIDE - Abnormal; Notable for the following:    Pro B Natriuretic peptide (BNP) 2385.0 (*)    All other components within normal limits  POCT I-STAT 3, BLOOD GAS (G3+) - Abnormal; Notable for the following:    pH, Arterial 7.632 (*)    pCO2 arterial 27.0 (*)    pO2, Arterial 107.0 (*)    Bicarbonate 28.6 (*)    Acid-Base Excess  8.0 (*)  All other components within normal limits  TROPONIN I  URINALYSIS, ROUTINE W REFLEX MICROSCOPIC  BLOOD GAS, ARTERIAL  BLOOD GAS, ARTERIAL  SALICYLATE LEVEL   Dg Chest Port 1 View  01/08/2013   *RADIOLOGY REPORT*  Clinical Data: Shortness of breath  PORTABLE CHEST - 1 VIEW  Comparison: 12/26/2012  Findings: Heart size appears enlarged.  No pleural effusion or edema.  No airspace consolidation identified.  Visualized bony thorax appears intact.  IMPRESSION:  1.  Cardiac enlargement. 2.  No heart failure.   Original Report Authenticated By: Signa Kell, M.D.     No diagnosis found.    MDM   Date: 01/08/2013  Rate: 67  Rhythm: normal sinus rhythm  QRS Axis: normal  Intervals: normal  ST/T Wave abnormalities: normal  Conduction Disutrbances: none  Narrative Interpretation: unremarkable   Pt with CKD, HTN comes in with sudden onset dyspnea. Pt's exam not consistent with pulm edema, copd exacerbation or asthma exacerbation. She is slightly tachypneic on my exam. PER EMS, patient looked anxious - she is comfortable during my evaluation.  We ordered basic screening labs. ABG was ordered to look at vent status -as she was slightly somnolent  - and patient was noted to have resp and metabolic alkalosis, with ph > 7.6. It appears that there was some hyperventilation.... Along with metabolic alkalosis.  Called CCM, and they agreed, just requested repeat ABG and adding salicylate level.  Repeat ABG is markedly improved. Will continue to monitor for a little bit, if she stays stable, will discharge.   Derwood Kaplan, MD 01/08/13 1241   4:00 PM Stable still. Will d/c. Son to stay with mother, or take her to his home. Urgent d/u with PCP stressed.  Derwood Kaplan, MD 01/08/13 1600

## 2013-01-08 NOTE — ED Notes (Addendum)
Patient assisted up to rest room to obtain urine. Sample sent to the lab

## 2013-01-08 NOTE — ED Notes (Signed)
Per EMS- Pt reports bronchitis x several weeks, called neighbor states "bronchitis spooked her". EMS arrived, pt anxious RR 25. Pt provided 2 L oxygen, able to relax. Pt has intermittent period of anxiety. Pt is alert and oriented. Family states this has never happened. 177/86, HR 75 SR, 100% RA. CBG 169.

## 2013-01-08 NOTE — ED Notes (Signed)
IV team notified that two RN's verified patient for an IV unable to obtain. IV team will come to attempt IV site. IV fluids pending

## 2013-01-09 ENCOUNTER — Emergency Department (HOSPITAL_COMMUNITY)
Admission: EM | Admit: 2013-01-09 | Discharge: 2013-01-09 | Disposition: A | Discharge status: Home / Self Care | Payer: Medicare Other | Attending: Emergency Medicine | Admitting: Emergency Medicine

## 2013-01-09 ENCOUNTER — Emergency Department (HOSPITAL_COMMUNITY): Payer: Medicare Other

## 2013-01-09 ENCOUNTER — Encounter (HOSPITAL_COMMUNITY): Payer: Self-pay | Admitting: *Deleted

## 2013-01-09 DIAGNOSIS — R51 Headache: Secondary | ICD-10-CM | POA: Diagnosis not present

## 2013-01-09 DIAGNOSIS — Z794 Long term (current) use of insulin: Secondary | ICD-10-CM | POA: Insufficient documentation

## 2013-01-09 DIAGNOSIS — Z79899 Other long term (current) drug therapy: Secondary | ICD-10-CM | POA: Diagnosis not present

## 2013-01-09 DIAGNOSIS — N189 Chronic kidney disease, unspecified: Secondary | ICD-10-CM | POA: Diagnosis not present

## 2013-01-09 DIAGNOSIS — Z7982 Long term (current) use of aspirin: Secondary | ICD-10-CM | POA: Diagnosis not present

## 2013-01-09 DIAGNOSIS — R259 Unspecified abnormal involuntary movements: Secondary | ICD-10-CM | POA: Insufficient documentation

## 2013-01-09 DIAGNOSIS — Z862 Personal history of diseases of the blood and blood-forming organs and certain disorders involving the immune mechanism: Secondary | ICD-10-CM | POA: Diagnosis not present

## 2013-01-09 DIAGNOSIS — E119 Type 2 diabetes mellitus without complications: Secondary | ICD-10-CM | POA: Diagnosis not present

## 2013-01-09 DIAGNOSIS — R569 Unspecified convulsions: Secondary | ICD-10-CM | POA: Diagnosis not present

## 2013-01-09 DIAGNOSIS — R0602 Shortness of breath: Secondary | ICD-10-CM | POA: Diagnosis not present

## 2013-01-09 DIAGNOSIS — Z8639 Personal history of other endocrine, nutritional and metabolic disease: Secondary | ICD-10-CM | POA: Insufficient documentation

## 2013-01-09 DIAGNOSIS — R251 Tremor, unspecified: Secondary | ICD-10-CM

## 2013-01-09 DIAGNOSIS — I129 Hypertensive chronic kidney disease with stage 1 through stage 4 chronic kidney disease, or unspecified chronic kidney disease: Secondary | ICD-10-CM | POA: Diagnosis not present

## 2013-01-09 LAB — COMPREHENSIVE METABOLIC PANEL
ALT: 7 U/L (ref 0–35)
AST: 13 U/L (ref 0–37)
Calcium: 9.3 mg/dL (ref 8.4–10.5)
Creatinine, Ser: 5.65 mg/dL — ABNORMAL HIGH (ref 0.50–1.10)
GFR calc Af Amer: 8 mL/min — ABNORMAL LOW (ref >90)
Glucose, Bld: 155 mg/dL — ABNORMAL HIGH (ref 70–99)
Sodium: 138 mEq/L (ref 135–145)
Total Protein: 8.1 g/dL (ref 6.0–8.3)

## 2013-01-09 LAB — CBC WITH DIFFERENTIAL/PLATELET
Basophils Relative: 0 % (ref 0–1)
HCT: 34.3 % — ABNORMAL LOW (ref 36.0–46.0)
Hemoglobin: 11.2 g/dL — ABNORMAL LOW (ref 12.0–15.0)
Lymphs Abs: 1.3 10*3/uL (ref 0.7–4.0)
MCHC: 32.7 g/dL (ref 30.0–36.0)
Monocytes Absolute: 0.5 10*3/uL (ref 0.1–1.0)
Monocytes Relative: 7 % (ref 3–12)
Neutro Abs: 5.2 10*3/uL (ref 1.7–7.7)
Neutrophils Relative %: 74 % (ref 43–77)
RBC: 3.89 MIL/uL (ref 3.87–5.11)

## 2013-01-09 MED ORDER — ALPRAZOLAM 0.25 MG PO TABS: 0.2500 mg | ORAL_TABLET | Freq: Three times a day (TID) | ORAL | Status: DC | PRN

## 2013-01-09 MED ORDER — SODIUM CHLORIDE 0.9 % IV SOLN
Freq: Once | INTRAVENOUS | Status: AC
  Administered 2013-01-09: 10:00:00 via INTRAVENOUS

## 2013-01-09 MED ORDER — ONDANSETRON HCL 4 MG/2ML IJ SOLN
4.0000 mg | Freq: Once | INTRAMUSCULAR | Status: AC
  Administered 2013-01-09: 4 mg via INTRAVENOUS
  Filled 2013-01-09: qty 2

## 2013-01-09 NOTE — ED Notes (Signed)
Patient here today with anxiety and seizure like activity, patient a/o x 3, patient seen here for same symptoms last night and d/c home

## 2013-01-09 NOTE — ED Provider Notes (Signed)
History     CSN: [id number]  Arrival date & time 01/09/13  0730   First MD Initiated Contact with Patient 01/09/13 [phone]      Chief Complaint  Patient presents with  . Anxiety    (Consider location/radiation/quality/duration/timing/severity/associated sxs/prior treatment) HPI Comments: Pt is a 78 y/o female who presents with multiple complaints - but CC is possible seizure activity.  According to the MR, the pt was seen for acute onset of dyspnea yesterday which had resolved over the course of the day and the ER visit.  She had an ABG, CXR and labs none of which delineated an obvious source of the patients symptoms.  She went home and this morning she awoke, had 2 episodes of vomiting and then started shaking according to the pt and her son who called 911 concerned that Ms Schexnider was having a seizure.  She remembers having the shaking and EMS noted that the pt had diffuse tremor which was quickly stopped with asking the pt to perform certain commands - she was not diaphoretic and had a CBG of 130's.  She had normal VS and normal rhythm strip per EMS.  She states that she was upset because of missing church yesterday but when she went home, she was back at her baseline.  She denies any other reason for her to be anxious and denies hx of panic attacks / anxiety in the past.  She does take a daily aspirin, antihypertensives and insulin.  Sx of shaking this morning were intermittent, easily stopped when pt follows commands and not associated with ETOH use, tongue biting or post ictal period.  Patient is a 78 y.o. female presenting with anxiety. The history is provided by the patient, the EMS personnel and medical records.  Anxiety    Past Medical History  Diagnosis Date  . Hypertension   . Diabetes mellitus   . Chronic kidney disease   . Thyroid disease   . Anemia     Past Surgical History  Procedure Laterality Date  . Arteriovenous graft placement      Family History  Problem  Relation Age of Onset  . Diabetes Mother   . Heart disease Mother   . Hypertension Mother     History  Substance Use Topics  . Smoking status: Never Smoker   . Smokeless tobacco: Not on file  . Alcohol Use: No    OB History   Grav Para Term Preterm Abortions TAB SAB Ect Mult Living                  Review of Systems  All other systems reviewed and are negative.    Allergies  Review of patient's allergies indicates no known allergies.  Home Medications   Current Outpatient Rx  Name  Route  Sig  Dispense  Refill  . albuterol (PROVENTIL HFA;VENTOLIN HFA) 108 (90 BASE) MCG/ACT inhaler   Inhalation   Inhale 1-2 puffs into the lungs every 6 (six) hours as needed for wheezing.   1 Inhaler   0   . amLODipine (NORVASC) 10 MG tablet   Oral   Take 10 mg by mouth daily.           Marland Kitchen aspirin 325 MG tablet   Oral   Take 325 mg by mouth daily.         . Aspirin Effervescent (ALKA-SELTZER PO)   Oral   Take 1 tablet by mouth as needed.         Marland Kitchen  calcitRIOL (ROCALTROL) 0.25 MCG capsule   Oral   Take 0.25 mcg by mouth daily.         . calcitRIOL (ROCALTROL) 0.5 MCG capsule   Oral   Take 0.5 mcg by mouth daily.         . Calcium Carbonate Antacid (TUMS ULTRA PO)   Oral   Take 2 tablets by mouth daily as needed. For acid reflux         . Ferrous Gluconate (IRON) 240 (27 FE) MG TABS   Oral   Take 27 mg by mouth daily.           . furosemide (LASIX) 80 MG tablet   Oral   Take 80 mg by mouth 2 (two) times daily.           . insulin aspart (NOVOLOG) 100 UNIT/ML injection   Subcutaneous   Inject 1-15 Units into the skin daily.           . insulin detemir (LEVEMIR) 100 UNIT/ML injection   Subcutaneous   Inject 15 Units into the skin at bedtime.         . metoprolol (LOPRESSOR) 50 MG tablet   Oral   Take 50 mg by mouth 2 (two) times daily.         . mometasone (NASONEX) 50 MCG/ACT nasal spray   Nasal   Place 2 sprays into the nose daily.          . pregabalin (LYRICA) 50 MG capsule   Oral   Take 50 mg by mouth 3 (three) times daily as needed (for pain).          Marland Kitchen ALPRAZolam (XANAX) 0.25 MG tablet   Oral   Take 1 tablet (0.25 mg total) by mouth 3 (three) times daily as needed for sleep or anxiety.   15 tablet   0     BP 172/76  Pulse 66  Temp(Src) 99.1 F (37.3 C) (Oral)  Resp 15  SpO2 100%  Physical Exam  Nursing note and vitals reviewed. Constitutional: She appears well-developed and well-nourished.  Anxious appearing  HENT:  Head: Normocephalic and atraumatic.  Mouth/Throat: Oropharynx is clear and moist. No oropharyngeal exudate.  Eyes: Conjunctivae and EOM are normal. Pupils are equal, round, and reactive to light. Right eye exhibits no discharge. Left eye exhibits no discharge. No scleral icterus.  Neck: Normal range of motion. Neck supple. No JVD present. No thyromegaly present.  Cardiovascular: Normal rate, regular rhythm, normal heart sounds and intact distal pulses.  Exam reveals no gallop and no friction rub.   No murmur heard. Pulmonary/Chest: Effort normal and breath sounds normal. No respiratory distress. She has no wheezes. She has no rales.  Abdominal: Soft. Bowel sounds are normal. She exhibits no distension and no mass. There is no tenderness.  Musculoskeletal: Normal range of motion. She exhibits no edema and no tenderness.  Lymphadenopathy:    She has no cervical adenopathy.  Neurological: Coordination normal.  Normal mental status, follows commands, clear speech, normal memory, no limb ataxia, normal strength in bilateral hands and legs.  CN 3-12 intact.  Intermittent tremor of either the arms or the whole body - has normal MS during this time and stops when asked to perform tasks.  Skin: Skin is warm and dry. No rash noted. No erythema.  Psychiatric:  Slightly tearful, anxious appearing.    ED Course  Procedures (including critical care time)  Labs Reviewed  COMPREHENSIVE  METABOLIC PANEL - Abnormal;  Notable for the following:    Potassium 3.4 (*)    Chloride 94 (*)    Glucose, Bld 155 (*)    BUN 75 (*)    Creatinine, Ser 5.65 (*)    Albumin 3.2 (*)    GFR calc non Af Amer 7 (*)    GFR calc Af Amer 8 (*)    All other components within normal limits  CBC WITH DIFFERENTIAL - Abnormal; Notable for the following:    Hemoglobin 11.2 (*)    HCT 34.3 (*)    RDW 15.9 (*)    All other components within normal limits  TROPONIN I  URINALYSIS, ROUTINE W REFLEX MICROSCOPIC   Ct Head Wo Contrast  01/09/2013   *RADIOLOGY REPORT*  Clinical Data: Severe headache.  Seizure-like activity.  CT HEAD WITHOUT CONTRAST  Technique:  Contiguous axial images were obtained from the base of the skull through the vertex without contrast.  Comparison: 05/21/2010  Findings: The brainstem and cerebellum are normal.  The cerebral hemispheres show mild age related atrophy without evidence of old or acute focal infarction.  No mass lesion, hemorrhage, hydrocephalus or extra-axial collection.  There is atherosclerotic vascular calcification and there is extensive dural calcification. The calvarium is unremarkable.  Visualized sinuses, middle ears and mastoids are clear.  IMPRESSION: No acute finding.  No cause of headache or seizure activity. Ordinary mild age related atrophy.   Original Report Authenticated By: Paulina Fusi, M.D.   Dg Chest Port 1 View  01/08/2013   *RADIOLOGY REPORT*  Clinical Data: Shortness of breath  PORTABLE CHEST - 1 VIEW  Comparison: 12/26/2012  Findings: Heart size appears enlarged.  No pleural effusion or edema.  No airspace consolidation identified.  Visualized bony thorax appears intact.  IMPRESSION:  1.  Cardiac enlargement. 2.  No heart failure.   Original Report Authenticated By: Signa Kell, M.D.     1. Tremor       MDM  The pt's presentation is not clear cut as to etiology - her VS show some hypertension but no hypoxia, no tachycardia and no fever.  Her  CXR from yesterday was reviewed showing no infiltrates and her lab work was also reviewed.  Check UA, Trop, CMP, reeval.  The tremor that I am witnessing on my exam is a diffuse body tremor and sometimes a RUE tremor that stops when I ask the pt to perform a task such as squeezing my hands or sitting up.  This is no seizure activity.  ED ECG REPORT  I personally interpreted this EKG   Date: 01/09/2013   Rate: 65  Rhythm: normal sinus rhythm  QRS Axis: normal  Intervals: normal  ST/T Wave abnormalities: normal  Conduction Disutrbances:none  Narrative Interpretation:   Old EKG Reviewed: unchanged c/w 01/08/13  The patient has been reevaluated multiple times, each time I go into the room the family states that she is having very short lived shaking spells with her arm, she is alert, these last only a few seconds and resolve spontaneously, there is no postictal state. This appears similar to what I witnessed as well however he; to the room she has a normal mental status and a normal exam. I think that the patient appears stable for discharge, she has a CT scan which was normal showing no signs of acute problems ischemia hemorrhage or mass lesions. I referred her to neurology.  Vida Roller, MD 01/09/13 1150

## 2013-01-10 LAB — URINE CULTURE: Colony Count: >=100000

## 2013-01-11 NOTE — ED Notes (Signed)
Post ED Visit - Positive Culture Follow-up  Culture report reviewed by antimicrobial stewardship pharmacist: []  Wes Dulaney, Pharm.D., BCPS [x]  Celedonio Miyamoto, Pharm.D., BCPS []  Georgina Pillion, [address].D., BCPS []  [place], [place].D., BCPS, AAHIVP []  Estella Husk, Pharm.D., BCPS, AAHIVP  Positive urine culture  no further patient follow-up is required at this time per [facility] Nurse.  Larena Sox 01/11/2013, 1:07 PM

## 2013-01-12 ENCOUNTER — Encounter: Payer: Self-pay | Admitting: Vascular Surgery

## 2013-01-12 ENCOUNTER — Other Ambulatory Visit: Payer: Self-pay

## 2013-01-12 DIAGNOSIS — Z01810 Encounter for preprocedural cardiovascular examination: Secondary | ICD-10-CM

## 2013-01-12 DIAGNOSIS — N186 End stage renal disease: Secondary | ICD-10-CM

## 2013-01-13 DIAGNOSIS — N181 Chronic kidney disease, stage 1: Secondary | ICD-10-CM | POA: Diagnosis not present

## 2013-01-13 DIAGNOSIS — I10 Essential (primary) hypertension: Secondary | ICD-10-CM | POA: Diagnosis not present

## 2013-01-13 DIAGNOSIS — E119 Type 2 diabetes mellitus without complications: Secondary | ICD-10-CM | POA: Diagnosis not present

## 2013-01-19 ENCOUNTER — Encounter: Payer: Self-pay | Admitting: Surgery

## 2013-01-23 ENCOUNTER — Encounter (INDEPENDENT_AMBULATORY_CARE_PROVIDER_SITE_OTHER): Payer: Medicare Other | Admitting: *Deleted

## 2013-01-23 ENCOUNTER — Ambulatory Visit (INDEPENDENT_AMBULATORY_CARE_PROVIDER_SITE_OTHER): Payer: Medicare Other | Admitting: Vascular Surgery

## 2013-01-23 ENCOUNTER — Encounter: Payer: Self-pay | Admitting: Vascular Surgery

## 2013-01-23 VITALS — BP 144/70 | HR 60 | Resp 16 | Ht 70.0 in | Wt 146.0 lb

## 2013-01-23 DIAGNOSIS — N186 End stage renal disease: Secondary | ICD-10-CM

## 2013-01-23 DIAGNOSIS — Z01810 Encounter for preprocedural cardiovascular examination: Secondary | ICD-10-CM

## 2013-01-23 NOTE — Progress Notes (Signed)
Subjective:     Patient ID: Jackie [name], female   DOB: Nov 02, 1934, 78 y.o.   MRN: [id number]  HPI this 78 year old female was referred by Dr. Darrick Penna for evaluation of vascular access. Patient has never been on hemodialysis. She is right-handed. She did have a left upper arm graft inserted a few years ago which failed early. Her veins have been too small to consider fistulas in the past. She has never agreed to go on hemodialysis or to have further access created. She is now nearing the need for hemodialysis.  Past Medical History  Diagnosis Date  . Hypertension   . Diabetes mellitus   . Chronic kidney disease   . Thyroid disease   . Anemia   . Hyperlipidemia     History  Substance Use Topics  . Smoking status: Never Smoker   . Smokeless tobacco: Never Used  . Alcohol Use: No    Family History  Problem Relation Age of Onset  . Diabetes Mother   . Heart disease Mother   . Hypertension Mother     No Known Allergies  Current outpatient prescriptions:albuterol (PROVENTIL HFA;VENTOLIN HFA) 108 (90 BASE) MCG/ACT inhaler, Inhale 1-2 puffs into the lungs every 6 (six) hours as needed for wheezing., Disp: 1 Inhaler, Rfl: 0;  ALPRAZolam (XANAX) 0.25 MG tablet, Take 1 tablet (0.25 mg total) by mouth 3 (three) times daily as needed for sleep or anxiety., Disp: 15 tablet, Rfl: 0;  amLODipine (NORVASC) 10 MG tablet, Take 10 mg by mouth daily.  , Disp: , Rfl:  aspirin 325 MG tablet, Take 325 mg by mouth daily., Disp: , Rfl: ;  Aspirin Effervescent (ALKA-SELTZER PO), Take 1 tablet by mouth as needed., Disp: , Rfl: ;  calcitRIOL (ROCALTROL) 0.25 MCG capsule, Take 0.25 mcg by mouth daily., Disp: , Rfl: ;  calcitRIOL (ROCALTROL) 0.5 MCG capsule, Take 0.5 mcg by mouth daily., Disp: , Rfl: ;  Calcium Carbonate Antacid (TUMS ULTRA PO), Take 2 tablets by mouth daily as needed. For acid reflux, Disp: , Rfl:  Ferrous Gluconate (IRON) 240 (27 FE) MG TABS, Take 27 mg by mouth daily.  , Disp: , Rfl: ;   furosemide (LASIX) 80 MG tablet, Take 80 mg by mouth 2 (two) times daily.  , Disp: , Rfl: ;  insulin aspart (NOVOLOG) 100 UNIT/ML injection, Inject 1-15 Units into the skin daily.  , Disp: , Rfl: ;  insulin detemir (LEVEMIR) 100 UNIT/ML injection, Inject 15 Units into the skin at bedtime., Disp: , Rfl:  metoprolol (LOPRESSOR) 50 MG tablet, Take 50 mg by mouth 2 (two) times daily., Disp: , Rfl: ;  mometasone (NASONEX) 50 MCG/ACT nasal spray, Place 2 sprays into the nose daily., Disp: , Rfl: ;  pregabalin (LYRICA) 50 MG capsule, Take 50 mg by mouth 3 (three) times daily as needed (for pain). , Disp: , Rfl:   BP 144/70  Pulse 60  Resp 16  Ht 5\' 10"  (1.778 m)  Wt 146 lb (66.225 kg)  BMI 20.95 kg/m2  Body mass index is 20.95 kg/(m^2).          Review of Systems denies chest pain, dyspnea on exertion, PND, orthopnea, hemoptysis. Does complain of pain in her feet when lying flat, history of thrombophlebitis, wheezing, and works skin rashes. Other systems negative complete review of systems     Objective:   Physical Exam blood pressure 140/70 heart rate 60 respirations 16 Gen.-alert and oriented x3 in no apparent distress HEENT normal for age Lungs  no rhonchi or wheezing Cardiovascular regular rhythm no murmurs carotid pulses 3+ palpable no bruits audible Abdomen soft nontender no palpable masses Musculoskeletal free of  major deformities Skin clear -no rashes Neurologic normal Lower extremities 3+ femoral pulses bilaterally with no mouth or distal pulses. The feet adequately perfused Left upper extremity with left upper arm graft which is pulseless Right upper extremity with 3+ brachial and radial pulse.  Today I ordered vein mapping of the right upper extremity which I reviewed and interpreted. Her veins are tiny not adequate for fistula creation at any level      Assessment:     Patient with end-stage renal disease needs vascular access    Plan:     Plan insertion right arm  AV graft by Dr. Leonides Sake on Monday, July 14. If vein caliber and antecubital areas adequate she may require right upper arm graft

## 2013-01-24 ENCOUNTER — Other Ambulatory Visit: Payer: Self-pay

## 2013-01-24 ENCOUNTER — Encounter (HOSPITAL_COMMUNITY): Payer: Self-pay | Admitting: Pharmacy Technician

## 2013-01-27 ENCOUNTER — Encounter (HOSPITAL_COMMUNITY): Payer: Self-pay | Admitting: *Deleted

## 2013-01-27 ENCOUNTER — Encounter (HOSPITAL_COMMUNITY)
Admission: RE | Admit: 2013-01-27 | Discharge: 2013-01-27 | Disposition: A | Discharge status: Home / Self Care | Payer: Medicare Other | Source: Ambulatory Visit | Attending: Nephrology | Admitting: Nephrology

## 2013-01-27 DIAGNOSIS — N184 Chronic kidney disease, stage 4 (severe): Secondary | ICD-10-CM | POA: Insufficient documentation

## 2013-01-27 DIAGNOSIS — D638 Anemia in other chronic diseases classified elsewhere: Secondary | ICD-10-CM | POA: Diagnosis not present

## 2013-01-27 LAB — IRON AND TIBC
Iron: 76 ug/dL (ref 42–135)
TIBC: 177 ug/dL — ABNORMAL LOW (ref 250–470)

## 2013-01-27 MED ORDER — EPOETIN ALFA 10000 UNIT/ML IJ SOLN
INTRAMUSCULAR | Status: AC
  Filled 2013-01-27: qty 1

## 2013-01-27 MED ORDER — EPOETIN ALFA 10000 UNIT/ML IJ SOLN
30000.0000 [IU] | INTRAMUSCULAR | Status: DC
  Administered 2013-01-27: 30000 [IU] via SUBCUTANEOUS

## 2013-01-27 MED ORDER — EPOETIN ALFA 20000 UNIT/ML IJ SOLN
INTRAMUSCULAR | Status: AC
  Filled 2013-01-27: qty 1

## 2013-01-29 MED ORDER — SODIUM CHLORIDE 0.9 % IV SOLN: INTRAVENOUS | Status: DC

## 2013-01-29 MED ORDER — DEXTROSE 5 % IV SOLN
1.5000 g | INTRAVENOUS | Status: AC
  Administered 2013-01-30: 1.5 g via INTRAVENOUS
  Filled 2013-01-29: qty 1.5

## 2013-01-30 ENCOUNTER — Ambulatory Visit (HOSPITAL_COMMUNITY)
Admission: RE | Admit: 2013-01-30 | Discharge: 2013-01-30 | Disposition: A | Discharge status: Home / Self Care | Payer: Medicare Other | Source: Ambulatory Visit | Attending: Vascular Surgery | Admitting: Vascular Surgery

## 2013-01-30 ENCOUNTER — Encounter (HOSPITAL_COMMUNITY): Payer: Self-pay | Admitting: Anesthesiology

## 2013-01-30 ENCOUNTER — Other Ambulatory Visit: Payer: Self-pay | Admitting: *Deleted

## 2013-01-30 ENCOUNTER — Ambulatory Visit (HOSPITAL_COMMUNITY): Payer: Medicare Other | Admitting: Anesthesiology

## 2013-01-30 ENCOUNTER — Encounter (HOSPITAL_COMMUNITY)
Admission: RE | Disposition: A | Discharge status: Home / Self Care | Payer: Self-pay | Source: Ambulatory Visit | Attending: Vascular Surgery

## 2013-01-30 DIAGNOSIS — I120 Hypertensive chronic kidney disease with stage 5 chronic kidney disease or end stage renal disease: Secondary | ICD-10-CM | POA: Insufficient documentation

## 2013-01-30 DIAGNOSIS — Z4931 Encounter for adequacy testing for hemodialysis: Secondary | ICD-10-CM

## 2013-01-30 DIAGNOSIS — N186 End stage renal disease: Secondary | ICD-10-CM | POA: Insufficient documentation

## 2013-01-30 DIAGNOSIS — E785 Hyperlipidemia, unspecified: Secondary | ICD-10-CM | POA: Diagnosis not present

## 2013-01-30 DIAGNOSIS — E079 Disorder of thyroid, unspecified: Secondary | ICD-10-CM | POA: Insufficient documentation

## 2013-01-30 DIAGNOSIS — E119 Type 2 diabetes mellitus without complications: Secondary | ICD-10-CM | POA: Insufficient documentation

## 2013-01-30 DIAGNOSIS — I10 Essential (primary) hypertension: Secondary | ICD-10-CM | POA: Diagnosis not present

## 2013-01-30 HISTORY — DX: Unspecified osteoarthritis, unspecified site: M19.90

## 2013-01-30 HISTORY — PX: AV FISTULA PLACEMENT: SHX1204

## 2013-01-30 LAB — POCT HEMOGLOBIN-HEMACUE: Hemoglobin: 10.1 g/dL — ABNORMAL LOW (ref 12.0–15.0)

## 2013-01-30 LAB — GLUCOSE, CAPILLARY: Glucose-Capillary: 143 mg/dL — ABNORMAL HIGH (ref 70–99)

## 2013-01-30 SURGERY — ARTERIOVENOUS (AV) FISTULA CREATION
Anesthesia: General | Site: Arm Upper | Laterality: Right | Wound class: Clean

## 2013-01-30 MED ORDER — GLYCOPYRROLATE 0.2 MG/ML IJ SOLN
INTRAMUSCULAR | Status: DC | PRN
  Administered 2013-01-30: 0.2 mg via INTRAVENOUS

## 2013-01-30 MED ORDER — FENTANYL CITRATE 0.05 MG/ML IJ SOLN
INTRAMUSCULAR | Status: DC | PRN
  Administered 2013-01-30: 25 ug via INTRAVENOUS
  Administered 2013-01-30: 50 ug via INTRAVENOUS
  Administered 2013-01-30: 25 ug via INTRAVENOUS

## 2013-01-30 MED ORDER — 0.9 % SODIUM CHLORIDE (POUR BTL) OPTIME
TOPICAL | Status: DC | PRN
  Administered 2013-01-30: 1000 mL

## 2013-01-30 MED ORDER — SODIUM CHLORIDE 0.9 % IV SOLN
INTRAVENOUS | Status: DC | PRN
  Administered 2013-01-30 (×2): via INTRAVENOUS

## 2013-01-30 MED ORDER — BUPIVACAINE HCL (PF) 0.5 % IJ SOLN
INTRAMUSCULAR | Status: AC
  Filled 2013-01-30: qty 30

## 2013-01-30 MED ORDER — PROPOFOL 10 MG/ML IV BOLUS
INTRAVENOUS | Status: DC | PRN
  Administered 2013-01-30: 30 mg via INTRAVENOUS
  Administered 2013-01-30: 120 mg via INTRAVENOUS

## 2013-01-30 MED ORDER — THROMBIN 20000 UNITS EX SOLR
CUTANEOUS | Status: AC
  Filled 2013-01-30: qty 20000

## 2013-01-30 MED ORDER — EPHEDRINE SULFATE 50 MG/ML IJ SOLN
INTRAMUSCULAR | Status: DC | PRN
  Administered 2013-01-30 (×5): 10 mg via INTRAVENOUS

## 2013-01-30 MED ORDER — SODIUM CHLORIDE 0.9 % IR SOLN
Status: DC | PRN
  Administered 2013-01-30: 08:00:00

## 2013-01-30 MED ORDER — LIDOCAINE-EPINEPHRINE (PF) 1 %-1:200000 IJ SOLN
INTRAMUSCULAR | Status: AC
  Filled 2013-01-30: qty 10

## 2013-01-30 MED ORDER — ARTIFICIAL TEARS OP OINT
TOPICAL_OINTMENT | OPHTHALMIC | Status: DC | PRN
  Administered 2013-01-30: 1 via OPHTHALMIC

## 2013-01-30 MED ORDER — OXYCODONE HCL 5 MG PO TABS: 5.0000 mg | ORAL_TABLET | ORAL | Status: DC | PRN

## 2013-01-30 MED ORDER — LIDOCAINE HCL (CARDIAC) 20 MG/ML IV SOLN
INTRAVENOUS | Status: DC | PRN
  Administered 2013-01-30: 40 mg via INTRAVENOUS

## 2013-01-30 MED FILL — Epoetin Alfa Inj 10000 Unit/ML: INTRAMUSCULAR | Qty: 1 | Status: AC

## 2013-01-30 MED FILL — Epoetin Alfa Inj 20000 Unit/ML: INTRAMUSCULAR | Qty: 1 | Status: AC

## 2013-01-30 SURGICAL SUPPLY — 41 items
ADH SKN CLS APL DERMABOND .7 (GAUZE/BANDAGES/DRESSINGS) ×1
ARMBAND PINK RESTRICT EXTREMIT (MISCELLANEOUS) ×2 IMPLANT
CANISTER SUCTION 2500CC (MISCELLANEOUS) ×2 IMPLANT
CLIP TI MEDIUM 6 (CLIP) ×2 IMPLANT
CLIP TI WIDE RED SMALL 6 (CLIP) ×2 IMPLANT
CLOTH BEACON ORANGE TIMEOUT ST (SAFETY) ×2 IMPLANT
COVER SURGICAL LIGHT HANDLE (MISCELLANEOUS) ×2 IMPLANT
DECANTER SPIKE VIAL GLASS SM (MISCELLANEOUS) ×2 IMPLANT
DERMABOND ADVANCED (GAUZE/BANDAGES/DRESSINGS) ×1
DERMABOND ADVANCED .7 DNX12 (GAUZE/BANDAGES/DRESSINGS) ×1 IMPLANT
ELECT REM PT RETURN 9FT ADLT (ELECTROSURGICAL) ×2
ELECTRODE REM PT RTRN 9FT ADLT (ELECTROSURGICAL) ×1 IMPLANT
GLOVE BIO SURGEON STRL SZ7 (GLOVE) ×2 IMPLANT
GLOVE BIOGEL PI IND STRL 6.5 (GLOVE) ×2 IMPLANT
GLOVE BIOGEL PI IND STRL 7.0 (GLOVE) ×2 IMPLANT
GLOVE BIOGEL PI IND STRL 7.5 (GLOVE) ×1 IMPLANT
GLOVE BIOGEL PI INDICATOR 6.5 (GLOVE) ×2
GLOVE BIOGEL PI INDICATOR 7.0 (GLOVE) ×2
GLOVE BIOGEL PI INDICATOR 7.5 (GLOVE) ×1
GLOVE ECLIPSE 7.5 STRL STRAW (GLOVE) ×2 IMPLANT
GLOVE SS BIOGEL STRL SZ 7 (GLOVE) ×1 IMPLANT
GLOVE SUPERSENSE BIOGEL SZ 7 (GLOVE) ×1
GOWN STRL NON-REIN LRG LVL3 (GOWN DISPOSABLE) ×6 IMPLANT
KIT BASIN OR (CUSTOM PROCEDURE TRAY) ×2 IMPLANT
KIT ROOM TURNOVER OR (KITS) ×2 IMPLANT
NS IRRIG 1000ML POUR BTL (IV SOLUTION) ×2 IMPLANT
PACK CV ACCESS (CUSTOM PROCEDURE TRAY) ×2 IMPLANT
PAD ARMBOARD 7.5X6 YLW CONV (MISCELLANEOUS) ×4 IMPLANT
SPONGE SURGIFOAM ABS GEL 100 (HEMOSTASIS) IMPLANT
SUT MNCRL AB 4-0 PS2 18 (SUTURE) ×2 IMPLANT
SUT PROLENE 5 0 C 1 24 (SUTURE) IMPLANT
SUT PROLENE 6 0 BV (SUTURE) ×2 IMPLANT
SUT PROLENE 7 0 BV 1 (SUTURE) ×2 IMPLANT
SUT PROLENE 7 0 BV1 MDA (SUTURE) ×2 IMPLANT
SUT SILK 2 0 FS (SUTURE) ×2 IMPLANT
SUT VIC AB 3-0 SH 27 (SUTURE) ×4
SUT VIC AB 3-0 SH 27X BRD (SUTURE) ×2 IMPLANT
TOWEL OR 17X24 6PK STRL BLUE (TOWEL DISPOSABLE) ×2 IMPLANT
TOWEL OR 17X26 10 PK STRL BLUE (TOWEL DISPOSABLE) ×2 IMPLANT
UNDERPAD 30X30 INCONTINENT (UNDERPADS AND DIAPERS) ×2 IMPLANT
WATER STERILE IRR 1000ML POUR (IV SOLUTION) ×2 IMPLANT

## 2013-01-30 NOTE — Transfer of Care (Signed)
Immediate Anesthesia Transfer of Care Note  Patient: Jackie [name]  Procedure(s) Performed: Procedure(s): ARTERIOVENOUS (AV) FISTULA CREATION (Right)  Patient Location: PACU  Anesthesia Type:General  Level of Consciousness: awake, alert , oriented and sedated  Airway & Oxygen Therapy: Patient Spontanous Breathing and Patient connected to nasal cannula oxygen  Post-op Assessment: Report given to PACU RN, Post -op Vital signs reviewed and stable and Patient moving all extremities  Post vital signs: Reviewed and stable  Complications: No apparent anesthesia complications

## 2013-01-30 NOTE — Interval H&P Note (Signed)
[facility]   History and Physical Update  The patient was interviewed and re-examined.  The patient's previous History and Physical has been reviewed and is unchanged from Dr. Candie Chroman consult on: 01/23/13.  There is no change in the plan of care: L arm fistula vs graft.  Leonides Sake, MD [facility] [place] Office: [phone] Pager: [phone]  01/30/2013, 7:25 AM

## 2013-01-30 NOTE — H&P (View-Only) (Signed)
Subjective:     Patient ID: Jackie [name], female   DOB: 01/14/1935, 78 y.o.   MRN: [id number]  HPI this 78-year-old female was referred by Dr. Deterding for evaluation of vascular access. Patient has never been on hemodialysis. She is right-handed. She did have a left upper arm graft inserted a few years ago which failed early. Her veins have been too small to consider fistulas in the past. She has never agreed to go on hemodialysis or to have further access created. She is now nearing the need for hemodialysis.  Past Medical History  Diagnosis Date  . Hypertension   . Diabetes mellitus   . Chronic kidney disease   . Thyroid disease   . Anemia   . Hyperlipidemia     History  Substance Use Topics  . Smoking status: Never Smoker   . Smokeless tobacco: Never Used  . Alcohol Use: No    Family History  Problem Relation Age of Onset  . Diabetes Mother   . Heart disease Mother   . Hypertension Mother     No Known Allergies  Current outpatient prescriptions:albuterol (PROVENTIL HFA;VENTOLIN HFA) 108 (90 BASE) MCG/ACT inhaler, Inhale 1-2 puffs into the lungs every 6 (six) hours as needed for wheezing., Disp: 1 Inhaler, Rfl: 0;  ALPRAZolam (XANAX) 0.25 MG tablet, Take 1 tablet (0.25 mg total) by mouth 3 (three) times daily as needed for sleep or anxiety., Disp: 15 tablet, Rfl: 0;  amLODipine (NORVASC) 10 MG tablet, Take 10 mg by mouth daily.  , Disp: , Rfl:  aspirin 325 MG tablet, Take 325 mg by mouth daily., Disp: , Rfl: ;  Aspirin Effervescent (ALKA-SELTZER PO), Take 1 tablet by mouth as needed., Disp: , Rfl: ;  calcitRIOL (ROCALTROL) 0.25 MCG capsule, Take 0.25 mcg by mouth daily., Disp: , Rfl: ;  calcitRIOL (ROCALTROL) 0.5 MCG capsule, Take 0.5 mcg by mouth daily., Disp: , Rfl: ;  Calcium Carbonate Antacid (TUMS ULTRA PO), Take 2 tablets by mouth daily as needed. For acid reflux, Disp: , Rfl:  Ferrous Gluconate (IRON) 240 (27 FE) MG TABS, Take 27 mg by mouth daily.  , Disp: , Rfl: ;   furosemide (LASIX) 80 MG tablet, Take 80 mg by mouth 2 (two) times daily.  , Disp: , Rfl: ;  insulin aspart (NOVOLOG) 100 UNIT/ML injection, Inject 1-15 Units into the skin daily.  , Disp: , Rfl: ;  insulin detemir (LEVEMIR) 100 UNIT/ML injection, Inject 15 Units into the skin at bedtime., Disp: , Rfl:  metoprolol (LOPRESSOR) 50 MG tablet, Take 50 mg by mouth 2 (two) times daily., Disp: , Rfl: ;  mometasone (NASONEX) 50 MCG/ACT nasal spray, Place 2 sprays into the nose daily., Disp: , Rfl: ;  pregabalin (LYRICA) 50 MG capsule, Take 50 mg by mouth 3 (three) times daily as needed (for pain). , Disp: , Rfl:   BP 144/70  Pulse 60  Resp 16  Ht 5' 10" (1.778 m)  Wt 146 lb (66.225 kg)  BMI 20.95 kg/m2  Body mass index is 20.95 kg/(m^2).          Review of Systems denies chest pain, dyspnea on exertion, PND, orthopnea, hemoptysis. Does complain of pain in her feet when lying flat, history of thrombophlebitis, wheezing, and works skin rashes. Other systems negative complete review of systems     Objective:   Physical Exam blood pressure 140/70 heart rate 60 respirations 16 Gen.-alert and oriented x3 in no apparent distress HEENT normal for age Lungs   no rhonchi or wheezing Cardiovascular regular rhythm no murmurs carotid pulses 3+ palpable no bruits audible Abdomen soft nontender no palpable masses Musculoskeletal free of  major deformities Skin clear -no rashes Neurologic normal Lower extremities 3+ femoral pulses bilaterally with no mouth or distal pulses. The feet adequately perfused Left upper extremity with left upper arm graft which is pulseless Right upper extremity with 3+ brachial and radial pulse.  Today I ordered vein mapping of the right upper extremity which I reviewed and interpreted. Her veins are tiny not adequate for fistula creation at any level      Assessment:     Patient with end-stage renal disease needs vascular access    Plan:     Plan insertion right arm  AV graft by Dr. Brian Chen on Monday, July 14. If vein caliber and antecubital areas adequate she may require right upper arm graft      

## 2013-01-30 NOTE — Anesthesia Preprocedure Evaluation (Addendum)
Anesthesia Evaluation  Patient identified by MRN, date of birth, ID band Patient awake    Reviewed: Allergy & Precautions, H&P , NPO status , Patient's Chart, lab work & pertinent test results, reviewed documented beta blocker date and time   Airway Mallampati: II TM Distance: >3 FB Neck ROM: full    Dental   Pulmonary neg pulmonary ROS,  breath sounds clear to auscultation        Cardiovascular hypertension, On Medications and On Home Beta Blockers Rhythm:regular     Neuro/Psych negative neurological ROS  negative psych ROS   GI/Hepatic negative GI ROS, Neg liver ROS,   Endo/Other  diabetes, Insulin Dependent  Renal/GU ESRFRenal disease  negative genitourinary   Musculoskeletal   Abdominal   Peds  Hematology  (+) anemia ,   Anesthesia Other Findings See surgeon's H&P   Reproductive/Obstetrics negative OB ROS                           Anesthesia Physical Anesthesia Plan  ASA: III  Anesthesia Plan: General   Post-op Pain Management:    Induction: Intravenous  Airway Management Planned: LMA  Additional Equipment:   Intra-op Plan:   Post-operative Plan:   Informed Consent: I have reviewed the patients History and Physical, chart, labs and discussed the procedure including the risks, benefits and alternatives for the proposed anesthesia with the patient or authorized representative who has indicated his/her understanding and acceptance.   Dental Advisory Given  Plan Discussed with: CRNA and Surgeon  Anesthesia Plan Comments:        Anesthesia Quick Evaluation

## 2013-01-30 NOTE — Preoperative (Signed)
Beta Blockers   Reason not to administer Beta Blockers:Not Applicable 

## 2013-01-30 NOTE — Op Note (Signed)
OPERATIVE NOTE   PROCEDURE: right brachiocephalic arteriovenous fistula placement  PRE-OPERATIVE DIAGNOSIS: imminent end stage renal disease   POST-OPERATIVE DIAGNOSIS: same as above   SURGEON: Leonides Sake, MD  ASSISTANT(S): Della Goo, [facility]   ANESTHESIA: general  ESTIMATED BLOOD LOSS: 50 cc  FINDING(S): 1.  Palpable thrill and dopplerable radial signal at end of case  SPECIMEN(S):  none  INDICATIONS:   Jackie [name] is a 78 y.o. female who presents with imminent end stage renal disease.  Her prior vein mapping suggested no fistula options in her right arm.  The patient is scheduled for right arteriovenous graft placement, but we discussed fistula placement if an acceptable vein presented itself.  The patient is aware the risks include but are not limited to: bleeding, infection, steal syndrome, nerve damage, ischemic monomelic neuropathy, failure to mature, and need for additional procedures.  The patient is aware of the risks of the procedure and elects to proceed forward.  DESCRIPTION: After full informed written consent was obtained from the patient, the patient was brought back to the operating room and placed supine upon the operating table.  Prior to induction, the patient received IV antibiotics.   After obtaining adequate anesthesia, the patient was then prepped and draped in the standard fashion for a right arm access procedure.  Immediately, I could see a reasonable cephalic vein at the antecubitum.  I followed the vein with the Sonosite and felt it was a reasonable candidate for a fistula.  I turned my attention first to identifying the patient's cephalic vein and brachial artery.  Using SonoSite guidance, the location of these vessels were marked out on the skin.   I made a transverse incision at the level of the antecubitum and dissected through the subcutaneous tissue and fascia to gain exposure of the brachial artery.  This was noted to be 4 mm in diameter externally.   This was dissected out proximally and distally and controlled with vessel loops .  I then dissected out the cephalic vein.  The vein was injuried during this process so I had to repair the venotomy with several interrupted 7-0 Prolene.  This was noted to be 4 mm in diameter externally.  The distal segment of the vein was ligated with a  2-0 silk, and the vein was transected.  The proximal segment was iinterrogated with serial dilators.  The vein accepted up to a 4 mm dilator without any difficulty.  I then instilled the heparinized saline into the vein and clamped it.  At this point, I reset my exposure of the brachial artery and placed the artery under tension proximally and distally.  I made an arteriotomy with a #11 blade, and then I extended the arteriotomy with a Potts scissor.  I injected heparinized saline proximal and distal to this arteriotomy.  The vein was then sewn to the artery in an end-to-side configuration with a running stitch of 6-0 Prolene.  Prior to completing this anastomosis, I allowed the vein and artery to backbleed.  There was no evidence of clot from any vessels.  I completed the anastomosis in the usual fashion and then released all vessel loops and clamps.  There was a palpable thrill in the venous outflow, and there was a dopplerable radial signal.  At this point, I irrigated out the surgical wound.  There was no further active bleeding.  The subcutaneous tissue was reapproximated with a running stitch of 3-0 Vicryl.  The skin was then reapproximated with a running subcuticular  stitch of 4-0 Vicryl.  The skin was then cleaned, dried, and reinforced with Dermabond.  The patient tolerated this procedure well.   COMPLICATIONS: none  CONDITION: stable  Leonides Sake, MD [facility] [place] Office: [phone] Pager: [phone]  01/30/2013, 9:13 AM

## 2013-01-30 NOTE — Anesthesia Postprocedure Evaluation (Signed)
Anesthesia Post Note  Patient: Jackie [name]  Procedure(s) Performed: Procedure(s) (LRB): ARTERIOVENOUS (AV) FISTULA CREATION (Right)  Anesthesia type: General  Patient location: PACU  Post pain: Pain level controlled  Post assessment: Patient's Cardiovascular Status Stable  Last Vitals:  Filed Vitals:   01/30/13 1007  BP: 143/64  Pulse: 71  Temp:   Resp: 12    Post vital signs: Reviewed and stable  Level of consciousness: alert  Complications: No apparent anesthesia complications

## 2013-01-30 NOTE — Interval H&P Note (Signed)
[facility] [place]  History and Physical Update  Correction: prior access in Left arm.  Today's access is Right arm fistula vs graft.  Leonides Sake, MD [facility] [place] Office: [phone] Pager: [phone]  01/30/2013, 7:34 AM

## 2013-01-31 ENCOUNTER — Encounter (HOSPITAL_COMMUNITY): Payer: Self-pay | Admitting: Vascular Surgery

## 2013-01-31 LAB — POCT I-STAT 4, (NA,K, GLUC, HGB,HCT): Sodium: 139 mEq/L (ref 135–145)

## 2013-02-08 ENCOUNTER — Ambulatory Visit: Payer: Medicare Other | Admitting: Vascular Surgery

## 2013-02-14 DIAGNOSIS — D649 Anemia, unspecified: Secondary | ICD-10-CM | POA: Diagnosis not present

## 2013-02-14 DIAGNOSIS — I10 Essential (primary) hypertension: Secondary | ICD-10-CM | POA: Diagnosis not present

## 2013-02-14 DIAGNOSIS — N185 Chronic kidney disease, stage 5: Secondary | ICD-10-CM | POA: Diagnosis not present

## 2013-02-14 DIAGNOSIS — N2581 Secondary hyperparathyroidism of renal origin: Secondary | ICD-10-CM | POA: Diagnosis not present

## 2013-02-14 DIAGNOSIS — I120 Hypertensive chronic kidney disease with stage 5 chronic kidney disease or end stage renal disease: Secondary | ICD-10-CM | POA: Diagnosis not present

## 2013-02-14 DIAGNOSIS — E119 Type 2 diabetes mellitus without complications: Secondary | ICD-10-CM | POA: Diagnosis not present

## 2013-02-14 DIAGNOSIS — E213 Hyperparathyroidism, unspecified: Secondary | ICD-10-CM | POA: Diagnosis not present

## 2013-02-14 DIAGNOSIS — N390 Urinary tract infection, site not specified: Secondary | ICD-10-CM | POA: Diagnosis not present

## 2013-02-17 ENCOUNTER — Other Ambulatory Visit (HOSPITAL_COMMUNITY): Payer: Self-pay | Admitting: *Deleted

## 2013-02-20 ENCOUNTER — Encounter (HOSPITAL_COMMUNITY)
Admission: RE | Admit: 2013-02-20 | Discharge: 2013-02-20 | Disposition: A | Discharge status: Home / Self Care | Payer: Medicare Other | Source: Ambulatory Visit | Attending: Nephrology | Admitting: Nephrology

## 2013-02-20 DIAGNOSIS — D638 Anemia in other chronic diseases classified elsewhere: Secondary | ICD-10-CM | POA: Insufficient documentation

## 2013-02-20 DIAGNOSIS — N184 Chronic kidney disease, stage 4 (severe): Secondary | ICD-10-CM | POA: Diagnosis not present

## 2013-02-20 LAB — IRON AND TIBC
Saturation Ratios: 31 % (ref 20–55)
UIBC: 120 ug/dL — ABNORMAL LOW (ref 125–400)

## 2013-02-20 MED ORDER — EPOETIN ALFA 10000 UNIT/ML IJ SOLN: 30000.0000 [IU] | INTRAMUSCULAR | Status: DC

## 2013-02-20 MED ORDER — EPOETIN ALFA 10000 UNIT/ML IJ SOLN
INTRAMUSCULAR | Status: AC
  Administered 2013-02-20: 10000 [IU] via SUBCUTANEOUS
  Filled 2013-02-20: qty 1

## 2013-02-20 MED ORDER — EPOETIN ALFA 20000 UNIT/ML IJ SOLN
INTRAMUSCULAR | Status: AC
  Administered 2013-02-20: 20000 [IU] via SUBCUTANEOUS
  Filled 2013-02-20: qty 1

## 2013-02-21 ENCOUNTER — Ambulatory Visit: Payer: Medicare Other | Admitting: Vascular Surgery

## 2013-02-24 ENCOUNTER — Encounter (HOSPITAL_COMMUNITY): Payer: Medicare Other

## 2013-03-02 ENCOUNTER — Encounter: Payer: Self-pay | Admitting: Vascular Surgery

## 2013-03-03 ENCOUNTER — Encounter: Payer: Self-pay | Admitting: Vascular Surgery

## 2013-03-03 ENCOUNTER — Ambulatory Visit (INDEPENDENT_AMBULATORY_CARE_PROVIDER_SITE_OTHER): Payer: Medicare Other | Admitting: Vascular Surgery

## 2013-03-03 ENCOUNTER — Encounter (INDEPENDENT_AMBULATORY_CARE_PROVIDER_SITE_OTHER): Payer: Medicare Other | Admitting: Vascular Surgery

## 2013-03-03 VITALS — BP 181/61 | HR 57 | Ht 70.0 in | Wt 159.1 lb

## 2013-03-03 DIAGNOSIS — Z4931 Encounter for adequacy testing for hemodialysis: Secondary | ICD-10-CM | POA: Diagnosis not present

## 2013-03-03 DIAGNOSIS — N186 End stage renal disease: Secondary | ICD-10-CM | POA: Diagnosis not present

## 2013-03-03 NOTE — Progress Notes (Signed)
[facility] [place]  Postoperative Access Visit  History of Present Illness  Jackie [name] is a 78 y.o. year old female who presents for postoperative follow-up for: R BC AVF (Date: 01/30/13).  The patient's wounds are  healed.  The patient notes no steal symptoms.  The patient is able to complete their activities of daily living.  The patient's current symptoms are: none.  For VQI Use Only  PRE-ADM LIVING: Home  AMB STATUS: Ambulatory  Physical Examination Filed Vitals:   03/03/13 1402  BP: 181/61  Pulse: 57    RUE: Incision is healed, skin feels warm, hand grip is 5/5, sensation in digits is intact, palpable thrill, bruit can be auscultated , on Sonosite: no stenosis visualized on transverse interrogation, no frozen valve visualized, smallest diameter: 5.6 mm   Medical Decision Making  Jackie [name] is a 78 y.o. year old female who presents s/p R BC AVF.  The patient's access is ready for use.  Any additional time would help this access dilate even further.  Thank you for allowing [place] to participate in this patient's care.  Leonides Sake, MD [facility] [place] Office: [phone] Pager: [phone]  03/03/2013, 2:53 PM

## 2013-04-18 ENCOUNTER — Emergency Department (HOSPITAL_COMMUNITY): Payer: Medicare Other

## 2013-04-18 ENCOUNTER — Inpatient Hospital Stay (HOSPITAL_COMMUNITY)
Admission: EM | Admit: 2013-04-18 | Discharge: 2013-04-26 | DRG: 811 | Disposition: A | Discharge status: Skilled Nursing Facility | Payer: Medicare Other | Attending: Internal Medicine | Admitting: Internal Medicine

## 2013-04-18 ENCOUNTER — Encounter (HOSPITAL_COMMUNITY): Payer: Self-pay | Admitting: Emergency Medicine

## 2013-04-18 DIAGNOSIS — Z7982 Long term (current) use of aspirin: Secondary | ICD-10-CM

## 2013-04-18 DIAGNOSIS — R404 Transient alteration of awareness: Secondary | ICD-10-CM | POA: Diagnosis not present

## 2013-04-18 DIAGNOSIS — D631 Anemia in chronic kidney disease: Secondary | ICD-10-CM | POA: Diagnosis not present

## 2013-04-18 DIAGNOSIS — Z79899 Other long term (current) drug therapy: Secondary | ICD-10-CM | POA: Diagnosis not present

## 2013-04-18 DIAGNOSIS — E119 Type 2 diabetes mellitus without complications: Secondary | ICD-10-CM | POA: Diagnosis present

## 2013-04-18 DIAGNOSIS — E8779 Other fluid overload: Secondary | ICD-10-CM | POA: Diagnosis not present

## 2013-04-18 DIAGNOSIS — E079 Disorder of thyroid, unspecified: Secondary | ICD-10-CM | POA: Diagnosis present

## 2013-04-18 DIAGNOSIS — J984 Other disorders of lung: Secondary | ICD-10-CM | POA: Diagnosis not present

## 2013-04-18 DIAGNOSIS — D62 Acute posthemorrhagic anemia: Secondary | ICD-10-CM | POA: Diagnosis not present

## 2013-04-18 DIAGNOSIS — J9600 Acute respiratory failure, unspecified whether with hypoxia or hypercapnia: Secondary | ICD-10-CM | POA: Diagnosis not present

## 2013-04-18 DIAGNOSIS — N185 Chronic kidney disease, stage 5: Secondary | ICD-10-CM

## 2013-04-18 DIAGNOSIS — J811 Chronic pulmonary edema: Secondary | ICD-10-CM | POA: Diagnosis not present

## 2013-04-18 DIAGNOSIS — E871 Hypo-osmolality and hyponatremia: Secondary | ICD-10-CM | POA: Diagnosis not present

## 2013-04-18 DIAGNOSIS — R031 Nonspecific low blood-pressure reading: Secondary | ICD-10-CM | POA: Diagnosis not present

## 2013-04-18 DIAGNOSIS — I509 Heart failure, unspecified: Secondary | ICD-10-CM | POA: Diagnosis not present

## 2013-04-18 DIAGNOSIS — I10 Essential (primary) hypertension: Secondary | ICD-10-CM | POA: Diagnosis present

## 2013-04-18 DIAGNOSIS — D72829 Elevated white blood cell count, unspecified: Secondary | ICD-10-CM | POA: Diagnosis not present

## 2013-04-18 DIAGNOSIS — Z794 Long term (current) use of insulin: Secondary | ICD-10-CM

## 2013-04-18 DIAGNOSIS — I498 Other specified cardiac arrhythmias: Secondary | ICD-10-CM | POA: Diagnosis not present

## 2013-04-18 DIAGNOSIS — I120 Hypertensive chronic kidney disease with stage 5 chronic kidney disease or end stage renal disease: Secondary | ICD-10-CM | POA: Diagnosis not present

## 2013-04-18 DIAGNOSIS — K8070 Calculus of gallbladder and bile duct without cholecystitis without obstruction: Secondary | ICD-10-CM | POA: Diagnosis not present

## 2013-04-18 DIAGNOSIS — N2581 Secondary hyperparathyroidism of renal origin: Secondary | ICD-10-CM | POA: Diagnosis not present

## 2013-04-18 DIAGNOSIS — Z5189 Encounter for other specified aftercare: Secondary | ICD-10-CM | POA: Diagnosis not present

## 2013-04-18 DIAGNOSIS — D649 Anemia, unspecified: Secondary | ICD-10-CM | POA: Diagnosis not present

## 2013-04-18 DIAGNOSIS — R531 Weakness: Secondary | ICD-10-CM

## 2013-04-18 DIAGNOSIS — E785 Hyperlipidemia, unspecified: Secondary | ICD-10-CM | POA: Diagnosis present

## 2013-04-18 DIAGNOSIS — T82898A Other specified complication of vascular prosthetic devices, implants and grafts, initial encounter: Secondary | ICD-10-CM | POA: Diagnosis not present

## 2013-04-18 DIAGNOSIS — I369 Nonrheumatic tricuspid valve disorder, unspecified: Secondary | ICD-10-CM | POA: Diagnosis not present

## 2013-04-18 DIAGNOSIS — N186 End stage renal disease: Secondary | ICD-10-CM | POA: Diagnosis not present

## 2013-04-18 DIAGNOSIS — R55 Syncope and collapse: Secondary | ICD-10-CM | POA: Diagnosis not present

## 2013-04-18 DIAGNOSIS — N189 Chronic kidney disease, unspecified: Secondary | ICD-10-CM | POA: Diagnosis not present

## 2013-04-18 DIAGNOSIS — J9601 Acute respiratory failure with hypoxia: Secondary | ICD-10-CM

## 2013-04-18 DIAGNOSIS — D509 Iron deficiency anemia, unspecified: Secondary | ICD-10-CM | POA: Diagnosis not present

## 2013-04-18 LAB — BASIC METABOLIC PANEL
GFR calc Af Amer: 5 mL/min — ABNORMAL LOW (ref >90)
GFR calc non Af Amer: 5 mL/min — ABNORMAL LOW (ref >90)
Glucose, Bld: 145 mg/dL — ABNORMAL HIGH (ref 70–99)
Potassium: 4.4 mEq/L (ref 3.5–5.1)
Sodium: 137 mEq/L (ref 135–145)

## 2013-04-18 LAB — RETICULOCYTES
Retic Count, Absolute: 42.6 10*3/uL (ref 19.0–186.0)
Retic Ct Pct: 2.1 % (ref 0.4–3.1)

## 2013-04-18 LAB — CBC WITH DIFFERENTIAL/PLATELET
Eosinophils Absolute: 0 10*3/uL (ref 0.0–0.7)
Lymphocytes Relative: 11 % — ABNORMAL LOW (ref 12–46)
Lymphs Abs: 1.6 10*3/uL (ref 0.7–4.0)
MCH: 29.6 pg (ref 26.0–34.0)
Neutrophils Relative %: 82 % — ABNORMAL HIGH (ref 43–77)
Platelets: 169 10*3/uL (ref 150–400)
RBC: 1.42 MIL/uL — ABNORMAL LOW (ref 3.87–5.11)
WBC: 15.3 10*3/uL — ABNORMAL HIGH (ref 4.0–10.5)

## 2013-04-18 LAB — GLUCOSE, CAPILLARY: Glucose-Capillary: 145 mg/dL — ABNORMAL HIGH (ref 70–99)

## 2013-04-18 LAB — POCT I-STAT TROPONIN I: Troponin i, poc: 0.17 ng/mL (ref 0.00–0.08)

## 2013-04-18 LAB — IRON AND TIBC
Iron: 27 ug/dL — ABNORMAL LOW (ref 42–135)
TIBC: 149 ug/dL — ABNORMAL LOW (ref 250–470)
UIBC: 122 ug/dL — ABNORMAL LOW (ref 125–400)

## 2013-04-18 LAB — ABO/RH: ABO/RH(D): A POS

## 2013-04-18 LAB — OCCULT BLOOD, POC DEVICE: Fecal Occult Bld: NEGATIVE

## 2013-04-18 LAB — HEMOGLOBIN AND HEMATOCRIT, BLOOD: Hemoglobin: 5.9 g/dL — CL (ref 12.0–15.0)

## 2013-04-18 LAB — PRO B NATRIURETIC PEPTIDE: Pro B Natriuretic peptide (BNP): 34669 pg/mL — ABNORMAL HIGH (ref 0–450)

## 2013-04-18 LAB — MRSA PCR SCREENING: MRSA by PCR: NEGATIVE

## 2013-04-18 LAB — PREPARE RBC (CROSSMATCH)

## 2013-04-18 LAB — FERRITIN: Ferritin: 4114 ng/mL — ABNORMAL HIGH (ref 10–291)

## 2013-04-18 MED ORDER — METOPROLOL TARTRATE 50 MG PO TABS
50.0000 mg | ORAL_TABLET | Freq: Two times a day (BID) | ORAL | Status: DC
  Administered 2013-04-19 – 2013-04-20 (×3): 50 mg via ORAL
  Filled 2013-04-18 (×7): qty 1

## 2013-04-18 MED ORDER — ACETAMINOPHEN 325 MG PO TABS
650.0000 mg | ORAL_TABLET | Freq: Four times a day (QID) | ORAL | Status: DC | PRN
  Administered 2013-04-19 – 2013-04-22 (×4): 650 mg via ORAL
  Filled 2013-04-18 (×4): qty 2

## 2013-04-18 MED ORDER — INSULIN ASPART 100 UNIT/ML ~~LOC~~ SOLN: 1.0000 [IU] | Freq: Every day | SUBCUTANEOUS | Status: DC

## 2013-04-18 MED ORDER — FUROSEMIDE 80 MG PO TABS
80.0000 mg | ORAL_TABLET | Freq: Two times a day (BID) | ORAL | Status: DC
  Administered 2013-04-19: 80 mg via ORAL
  Filled 2013-04-18 (×3): qty 1

## 2013-04-18 MED ORDER — ONDANSETRON HCL 4 MG/2ML IJ SOLN: 4.0000 mg | Freq: Four times a day (QID) | INTRAMUSCULAR | Status: DC | PRN

## 2013-04-18 MED ORDER — IPRATROPIUM BROMIDE 0.02 % IN SOLN
0.5000 mg | Freq: Once | RESPIRATORY_TRACT | Status: AC
  Administered 2013-04-18: 0.5 mg via RESPIRATORY_TRACT
  Filled 2013-04-18: qty 2.5

## 2013-04-18 MED ORDER — MORPHINE SULFATE 2 MG/ML IJ SOLN
1.0000 mg | INTRAMUSCULAR | Status: DC | PRN
  Administered 2013-04-21: 1 mg via INTRAVENOUS

## 2013-04-18 MED ORDER — PREGABALIN 50 MG PO CAPS
50.0000 mg | ORAL_CAPSULE | Freq: Three times a day (TID) | ORAL | Status: DC
  Administered 2013-04-18: 50 mg via ORAL
  Filled 2013-04-18: qty 1

## 2013-04-18 MED ORDER — CALCITRIOL 0.25 MCG PO CAPS
0.2500 ug | ORAL_CAPSULE | Freq: Every day | ORAL | Status: DC
  Filled 2013-04-18: qty 1

## 2013-04-18 MED ORDER — ACETAMINOPHEN 650 MG RE SUPP: 650.0000 mg | Freq: Four times a day (QID) | RECTAL | Status: DC | PRN

## 2013-04-18 MED ORDER — ALBUTEROL SULFATE (5 MG/ML) 0.5% IN NEBU
5.0000 mg | INHALATION_SOLUTION | Freq: Once | RESPIRATORY_TRACT | Status: AC
  Administered 2013-04-18: 5 mg via RESPIRATORY_TRACT
  Filled 2013-04-18: qty 1

## 2013-04-18 MED ORDER — METOPROLOL TARTRATE 25 MG PO TABS
25.0000 mg | ORAL_TABLET | Freq: Once | ORAL | Status: AC
  Administered 2013-04-18: 25 mg via ORAL
  Filled 2013-04-18: qty 1

## 2013-04-18 MED ORDER — FLUTICASONE PROPIONATE 50 MCG/ACT NA SUSP
1.0000 | Freq: Every day | NASAL | Status: DC
  Administered 2013-04-20 – 2013-04-26 (×5): 1 via NASAL
  Filled 2013-04-18 (×2): qty 16

## 2013-04-18 MED ORDER — ASPIRIN 325 MG PO TABS
325.0000 mg | ORAL_TABLET | Freq: Every day | ORAL | Status: DC
  Administered 2013-04-20 – 2013-04-26 (×7): 325 mg via ORAL
  Filled 2013-04-18 (×8): qty 1

## 2013-04-18 MED ORDER — ONDANSETRON HCL 4 MG PO TABS: 4.0000 mg | ORAL_TABLET | Freq: Four times a day (QID) | ORAL | Status: DC | PRN

## 2013-04-18 MED ORDER — AMLODIPINE BESYLATE 10 MG PO TABS
10.0000 mg | ORAL_TABLET | Freq: Every day | ORAL | Status: DC
  Administered 2013-04-20: 10 mg via ORAL
  Filled 2013-04-18 (×3): qty 1

## 2013-04-18 MED ORDER — ALBUTEROL SULFATE HFA 108 (90 BASE) MCG/ACT IN AERS
1.0000 | INHALATION_SPRAY | Freq: Four times a day (QID) | RESPIRATORY_TRACT | Status: DC | PRN
  Filled 2013-04-18: qty 6.7

## 2013-04-18 MED ORDER — INSULIN ASPART 100 UNIT/ML ~~LOC~~ SOLN
0.0000 [IU] | Freq: Three times a day (TID) | SUBCUTANEOUS | Status: DC
  Administered 2013-04-18 – 2013-04-21 (×2): 2 [IU] via SUBCUTANEOUS
  Administered 2013-04-23: 5 [IU] via SUBCUTANEOUS
  Administered 2013-04-25: 13:00:00 3 [IU] via SUBCUTANEOUS
  Administered 2013-04-26: 13:00:00 2 [IU] via SUBCUTANEOUS

## 2013-04-18 MED ORDER — INSULIN DETEMIR 100 UNIT/ML ~~LOC~~ SOLN
15.0000 [IU] | Freq: Every day | SUBCUTANEOUS | Status: DC
  Administered 2013-04-18 – 2013-04-26 (×8): 15 [IU] via SUBCUTANEOUS
  Filled 2013-04-18 (×9): qty 0.15

## 2013-04-18 NOTE — ED Provider Notes (Signed)
CSN: [id number]     Arrival date & time 04/18/13  7829 History   First MD Initiated Contact with Patient 04/18/13 [phone]     Chief Complaint  Patient presents with  . Loss of Consciousness   (Consider location/radiation/quality/duration/timing/severity/associated sxs/prior Treatment) Patient is a 78 y.o. female presenting with syncope. The history is provided by the patient, the EMS personnel and a relative.  Loss of Consciousness Episode history:  Single Most recent episode:  Today Timing:  Constant Progression:  Resolved Chronicity:  New Context: standing up   Witnessed: yes   Worsened by:  Nothing tried Associated symptoms: difficulty breathing (cough and shortness of breath), malaise/fatigue and shortness of breath   Associated symptoms: no anxiety, no chest pain, no diaphoresis, no dizziness, no fever, no focal weakness, no headaches, no nausea, no visual change, no vomiting and no weakness     Past Medical History  Diagnosis Date  . Hypertension   . Diabetes mellitus   . Chronic kidney disease   . Thyroid disease   . Anemia   . Hyperlipidemia   . Arthritis    Past Surgical History  Procedure Laterality Date  . Arteriovenous graft placement Left     non function  . Eye surgery Bilateral     catarct  . Cesarean section    . Av fistula placement Right 01/30/2013    Procedure: ARTERIOVENOUS (AV) FISTULA CREATION;  Surgeon: Fransisco Hertz, MD;  Location: [facility] OR;  Service: Vascular;  Laterality: Right;   Family History  Problem Relation Age of Onset  . Diabetes Mother   . Heart disease Mother   . Hypertension Mother    History  Substance Use Topics  . Smoking status: Never Smoker   . Smokeless tobacco: Never Used  . Alcohol Use: No   OB History   Grav Para Term Preterm Abortions TAB SAB Ect Mult Living                 Review of Systems  Constitutional: Positive for malaise/fatigue. Negative for fever and diaphoresis.  HENT: Negative for congestion and  rhinorrhea.   Respiratory: Positive for shortness of breath.   Cardiovascular: Positive for syncope. Negative for chest pain.  Gastrointestinal: Negative for nausea, vomiting, diarrhea and constipation.  Musculoskeletal: Negative for myalgias and arthralgias.  Skin: Negative for color change, pallor, rash and wound.  Neurological: Positive for syncope. Negative for dizziness, focal weakness, facial asymmetry, speech difficulty, weakness, light-headedness, numbness and headaches.  All other systems reviewed and are negative.    Allergies  Review of patient's allergies indicates no known allergies.  Home Medications   Current Outpatient Rx  Name  Route  Sig  Dispense  Refill  . albuterol (PROVENTIL HFA;VENTOLIN HFA) 108 (90 BASE) MCG/ACT inhaler   Inhalation   Inhale 1-2 puffs into the lungs every 6 (six) hours as needed for wheezing.   1 Inhaler   0   . ALPRAZolam (XANAX) 0.25 MG tablet   Oral   Take 1 tablet (0.25 mg total) by mouth 3 (three) times daily as needed for sleep or anxiety.   15 tablet   0   . amLODipine (NORVASC) 10 MG tablet   Oral   Take 10 mg by mouth daily.           Marland Kitchen aspirin 325 MG tablet   Oral   Take 325 mg by mouth daily.         . Aspirin Effervescent (ALKA-SELTZER PO)   Oral  Take 1 tablet by mouth as needed. For indigestion and heartburn         . calcitRIOL (ROCALTROL) 0.25 MCG capsule   Oral   Take 0.25 mcg by mouth daily.         . Calcium Carbonate Antacid (TUMS ULTRA PO)   Oral   Take 1 tablet by mouth daily as needed. For acid reflux         . FOSRENOL 1000 MG chewable tablet   Oral   Chew 1,000 mg by mouth 3 (three) times daily with meals.          . furosemide (LASIX) 80 MG tablet   Oral   Take 80 mg by mouth 2 (two) times daily.           . insulin aspart (NOVOLOG) 100 UNIT/ML injection   Subcutaneous   Inject 1-15 Units into the skin daily.           . insulin detemir (LEVEMIR) 100 UNIT/ML injection    Subcutaneous   Inject 15 Units into the skin at bedtime.         . IRON PO   Oral   Take 1 tablet by mouth daily.         . metoprolol (LOPRESSOR) 50 MG tablet   Oral   Take 50 mg by mouth 2 (two) times daily.         . mometasone (NASONEX) 50 MCG/ACT nasal spray   Nasal   Place 2 sprays into the nose daily as needed. For nasal congestion         . oxyCODONE (ROXICODONE) 5 MG immediate release tablet   Oral   Take 1 tablet (5 mg total) by mouth every 4 (four) hours as needed for pain.   30 tablet   0   . pregabalin (LYRICA) 50 MG capsule   Oral   Take 50 mg by mouth 3 (three) times daily.           BP 115/51  Pulse 57  Temp(Src) 99.8 F (37.7 C) (Rectal)  Resp 15  SpO2 98% Physical Exam  Nursing note and vitals reviewed. Constitutional: She is oriented to person, place, and time. She appears well-developed. No distress.  Chronically ill appearing female in NAD  HENT:  Head: Normocephalic and atraumatic.  Eyes: Conjunctivae and EOM are normal. Pupils are equal, round, and reactive to light.  Neck: Normal range of motion. Neck supple.  Cardiovascular: Normal rate, regular rhythm and normal heart sounds.  Exam reveals no gallop and no friction rub.   No murmur heard. Pulmonary/Chest: Effort normal. No respiratory distress (requiring 4L nasal canula but no increased work of breathing). She has wheezes (diffuse). She has rhonchi in the left middle field and the left lower field.  Abdominal: Soft. She exhibits no distension. There is no tenderness.  Genitourinary: Rectum normal.  Brown stool, no blood, hemoccult negative  Musculoskeletal: Normal range of motion. She exhibits no edema and no tenderness.  AV fistula to RUE  Lymphadenopathy:    She has no cervical adenopathy.  Neurological: She is alert and oriented to person, place, and time.  Skin: Skin is warm and dry. She is not diaphoretic.  Psychiatric: She has a normal mood and affect. Her behavior is  normal. Judgment and thought content normal.    ED Course  Procedures (including critical care time) Labs Review Labs Reviewed  CBC WITH DIFFERENTIAL - Abnormal; Notable for the following:    WBC 15.3 (*)  RBC 1.42 (*)    Hemoglobin 4.2 (*)    HCT 12.2 (*)    RDW 16.0 (*)    Neutrophils Relative % 82 (*)    Neutro Abs 12.6 (*)    Lymphocytes Relative 11 (*)    Monocytes Absolute 1.1 (*)    All other components within normal limits  BASIC METABOLIC PANEL - Abnormal; Notable for the following:    Glucose, Bld 145 (*)    BUN 110 (*)    Creatinine, Ser 7.65 (*)    GFR calc non Af Amer 5 (*)    GFR calc Af Amer 5 (*)    All other components within normal limits  PRO B NATRIURETIC PEPTIDE - Abnormal; Notable for the following:    Pro B Natriuretic peptide (BNP) 34669.0 (*)    All other components within normal limits  RETICULOCYTES - Abnormal; Notable for the following:    RBC. 2.03 (*)    All other components within normal limits  HEMOGLOBIN AND HEMATOCRIT, BLOOD - Abnormal; Notable for the following:    Hemoglobin 5.9 (*)    HCT 17.6 (*)    All other components within normal limits  POCT I-STAT TROPONIN I - Abnormal; Notable for the following:    Troponin i, poc 0.17 (*)    All other components within normal limits  URINE CULTURE  CULTURE, BLOOD (ROUTINE X 2)  CULTURE, BLOOD (ROUTINE X 2)  URINALYSIS, ROUTINE W REFLEX MICROSCOPIC  VITAMIN B12  FOLATE  IRON AND TIBC  FERRITIN  PREPARE RBC (CROSSMATCH)  TYPE AND SCREEN  ABO/RH   Imaging Review Dg Chest 2 View  04/18/2013   CLINICAL DATA:  Short of breath and wheezing  EXAM: CHEST  2 VIEW  COMPARISON:  01/08/2013  FINDINGS: Cardiac enlargement. Diffuse bilateral airspace disease, most consistent with pulmonary edema. Small effusions and mild bibasilar atelectasis.  IMPRESSION: Congestive heart failure with diffuse pulmonary edema.   Electronically Signed   By: Marlan Palau M.D.   On: 04/18/2013 09:59    MDM   1.  Anemia   2. Weakness   3. Diabetes mellitus   4. End stage renal disease   5. HTN (hypertension), benign   6. Syncope   7. Leukocytosis   8. CKD (chronic kidney disease) stage 5, GFR less than 15 ml/min   9. CHF (congestive heart failure)   10. Acute respiratory failure with hypoxia     The patient is a 78 year old female with a history of hypertension, type 2 diabetes, chronic kidney disease who presents with a syncopal episode. She states that yesterday she felt sick and generally unwell throughout most of the day yesterday. She also endorses a mildly productive cough. This morning when he moving towards the bathroom, she felt lightheaded and proceeded to syncopize. Her grand nephew was at her side and prevented her from hitting the ground. She has no obvious injuries from the fall. 911 was immediately called and by the time EMS arrived approximately 7 minutes later, the patient was still unresponsive. Initial rhythm strip showed sinus bradycardia with an arrhythmia. I gradually began to back out to sinus rhythm in the 60s and 70s that time she was in the truck and she was beginning to regain consciousness. She had no focal shaking and no signs of seizure activity, no postictal period. On arrival to the emergency department, she states that she generally feels well and back to her baseline. She is afebrile and vital signs are stable with a pulse  of 64, mild hypertension at 150. She is requiring 4 L of oxygen to maintain her saturations mid 90s, and when oxygen was removed her saturations drop into the 70s.  On exam she has diffuse wheezing with rhonchorous breath sounds primarily on the left. EKG obtained shows normal sinus rhythm with no significant ST changes as documented below. We'll obtain rectal temperature as well as chest x-ray, urinalysis, full labs. Differential is broad but includes pneumonia, CHF, urinary tract infection, symptomatic bradycardia, or electrolyte abnormality, uremia. Will  also have respiratory evaluate and treat for the wheezing with new oxygen requirement.   Date: 04/18/2013  Rate: 64  Rhythm: normal sinus rhythm  QRS Axis: normal  Intervals: normal  ST/T Wave abnormalities: normal  Conduction Disutrbances:none  Narrative Interpretation:   Old EKG Reviewed: unchanged  Labs returned showing significant anemia at 4.2. Repeat hgb 5.9 and hct 17.6. Rectal exam with brown stool and no gross blood. Hemoccult negative. Troponin elevated likely reflective of demand. BNP also 16109 and combined with CXR showing diffuse edema and cardiomegaly, feel that CHF is likely also significant cause of patient's symptoms and dyspnea with oxygen requirement. Diuresis deferred to inpatient team based on coinciding anemia and CKD with crt of 7.6, which is above previous baseline around 6. Hospitalist consulted for admission and will need admission to stepdown unit. Anemia labs sent prior to admission. Type and screen for four units sent. Anticipate admission to stepdown floor.  Patient was discussed with my attending, Dr. Patria Mane.  Dorna Leitz, MD 04/18/13 1556

## 2013-04-18 NOTE — ED Notes (Signed)
Bed in Low post w/ brakes on and side rails up x 2. Call bell within reach of pt. Comfort measures offered.  

## 2013-04-18 NOTE — Progress Notes (Signed)
Unit CM UR Completed by [facility] ED CM  W. Ellery Meroney RN  

## 2013-04-18 NOTE — ED Notes (Addendum)
Per [facility], pt from home, pt lives with grandson. Was on her way to the bathroom and felt weak. Grandson helped lower pt to the floor. Fistula in right arm which hasn't matured yet. CBG 121. HR initially 30-40 and irregular. HR NSR in rate of 60.

## 2013-04-18 NOTE — ED Notes (Signed)
Dr. Durward Fortes at the bedside to attempt a IV start. Unsuccessful. IV team repaged

## 2013-04-18 NOTE — ED Notes (Signed)
Pt return from radiology

## 2013-04-18 NOTE — Progress Notes (Signed)
VASCULAR LAB PRELIMINARY  PRELIMINARY  PRELIMINARY  PRELIMINARY  Carotid duplex completed.    Preliminary report:  Bilateral:  1-39% ICA stenosis.  Vertebral artery flow is antegrade.     Ranesha Val, RVS 04/18/2013, 4:02 PM

## 2013-04-18 NOTE — ED Notes (Signed)
Pt unable to give urine sample.  Waiting on IV team. Second RN into room to look for IV access.

## 2013-04-18 NOTE — ED Notes (Signed)
Pt returned from radiology.  RT at bedside.

## 2013-04-18 NOTE — ED Provider Notes (Addendum)
I saw and evaluated the patient, reviewed the resident's note and I agree with the findings and plan. I personally evaluated the ECG and agree with the interpretation of the resident.   Patient with presyncopal episode today.  Generalized weakness.  Rectal temp normal.  Chest x-ray concerning for heart failure.  Hemoglobin found to be 4-1/2.  The patient be transfused packed red blood cells.  Admitted to the step down unit.  No ischemic changes on EKG.  No arrhythmia noted in the ER.  Mild hypoxia at 91% likely secondary to anemia.  Question CHF versus bilateral infiltrates.  Rectal temp is 99.8.  We will obtain blood cultures.  Hold antibiotics at this time.  Antibiotics to be decided on by the admitting team.  CRITICAL CARE Performed by: Lyanne Co Total critical care time: 35 Critical care time was exclusive of separately billable procedures and treating other patients. Critical care was necessary to treat or prevent imminent or life-threatening deterioration. Critical care was time spent personally by me on the following activities: development of treatment plan with patient and/or surrogate as well as nursing, discussions with consultants, evaluation of patient's response to treatment, examination of patient, obtaining history from patient or surrogate, ordering and performing treatments and interventions, ordering and review of laboratory studies, ordering and review of radiographic studies, pulse oximetry and re-evaluation of patient's condition.   Angiocath insertion Performed by: Lyanne Co Consent: Verbal consent obtained. Risks and benefits: risks, benefits and alternatives were discussed Time out: Immediately prior to procedure a "time out" was called to verify the correct patient, procedure, equipment, support staff and site/side marked as required. Preparation: Patient was prepped and draped in the usual sterile fashion. Vein Location: left EJ Gauge: 20 Normal blood return  and flush without difficulty Patient tolerance: Patient tolerated the procedure well with no immediate complications.  Apiration of blood/fluid Performed by: Lyanne Co Consent obtained. Required items: required blood products, implants, devices, and special equipment available Patient identity confirmed: verbally with patient Time out: Immediately prior to procedure a "time out" was called to verify the correct patient, procedure, equipment, support staff and site/side marked as required. Preparation: Patient was prepped and draped in the usual sterile fashion. Patient tolerance: Patient tolerated the procedure well with no immediate complications. Location of aspiration: LEFT EJ (blood draw for additional labs, nursing and phlebotomy unable to obtain)          Lyanne Co, MD 04/18/13 7829  Lyanne Co, MD 04/19/13 [phone]

## 2013-04-18 NOTE — H&P (Signed)
[facility] History and Physical  Jackie [name] ZOX:[id number] DOB: [birth year] DOA: 04/18/2013  Referring physician:  PCP: Evlyn Courier, MD  Specialists:   Chief Complaint: Passed Out  HPI: Jackie [name] is a 78 y.o. female with a history of hypertension, diabetes mellitus, chronic kidney disease with AV fistula in place, presents to the emergency department for passing out. Patient states that she was going to the bathroom this morning and passed out she was asked to call by her grandson. She did not hit her head. Patient was past approximately 10-15 minutes. According to the emergency department physician patient was found to have sinus bradycardia when EMS arrived. Patient denies any current shortness of breath chest pain cough abdominal pain hematemesis hematochezia. She does state however that yesterday she felt sick and weak and stayed in bed all day. Patient states that she recently had bronchitis. Patient states this never happened to her before. She denies any cardiac history. Of note, patient has never had a colonoscopy.   Review of Systems:  In addition to the HPI above, No Fever-chills, No Headache, No changes with Vision or hearing, No problems swallowing food or Liquids, No Chest pain, Cough or Shortness of Breath, No Abdominal pain, No Nausea or Vommitting, Bowel movements are regular, No Blood in stool or Urine, No dysuria, No new skin rashes or bruises, No new joints pains-aches,  No tingling, numbness in any extremity, No recent weight gain or loss, No polyuria, polydypsia or polyphagia, No significant Mental Stressors.  Past Medical History  Diagnosis Date  . Hypertension   . Diabetes mellitus   . Chronic kidney disease   . Thyroid disease   . Anemia   . Hyperlipidemia   . Arthritis    Past Surgical History  Procedure Laterality Date  . Arteriovenous graft placement Left     non function  . Eye surgery Bilateral     catarct  . Cesarean section     . Av fistula placement Right 01/30/2013    Procedure: ARTERIOVENOUS (AV) FISTULA CREATION;  Surgeon: Fransisco Hertz, MD;  Location: [facility] OR;  Service: Vascular;  Laterality: Right;   Social History:  reports that she has never smoked. She has never used smokeless tobacco. She reports that she does not drink alcohol or use illicit drugs. Patient lives at home with family.  No Known Allergies  Family History  Problem Relation Age of Onset  . Diabetes Mother   . Heart disease Mother   . Hypertension Mother     Prior to Admission medications   Medication Sig Start Date End Date Taking? Authorizing Provider  albuterol (PROVENTIL HFA;VENTOLIN HFA) 108 (90 BASE) MCG/ACT inhaler Inhale 1-2 puffs into the lungs every 6 (six) hours as needed for wheezing. 12/27/12  Yes April K Palumbo-Rasch, MD  ALPRAZolam Prudy Feeler) 0.25 MG tablet Take 1 tablet (0.25 mg total) by mouth 3 (three) times daily as needed for sleep or anxiety. 01/09/13  Yes Vida Roller, MD  amLODipine (NORVASC) 10 MG tablet Take 10 mg by mouth daily.     Yes Historical Provider, MD  aspirin 325 MG tablet Take 325 mg by mouth daily.   Yes Historical Provider, MD  Aspirin Effervescent (ALKA-SELTZER PO) Take 1 tablet by mouth as needed. For indigestion and heartburn   Yes Historical Provider, MD  calcitRIOL (ROCALTROL) 0.25 MCG capsule Take 0.25 mcg by mouth daily.   Yes Historical Provider, MD  Calcium Carbonate Antacid (TUMS ULTRA PO) Take 1 tablet by mouth  daily as needed. For acid reflux   Yes Historical Provider, MD  FOSRENOL 1000 MG chewable tablet Chew 1,000 mg by mouth 3 (three) times daily with meals.  01/18/13  Yes Historical Provider, MD  furosemide (LASIX) 80 MG tablet Take 80 mg by mouth 2 (two) times daily.     Yes Historical Provider, MD  insulin aspart (NOVOLOG) 100 UNIT/ML injection Inject 1-15 Units into the skin daily.     Yes Historical Provider, MD  insulin detemir (LEVEMIR) 100 UNIT/ML injection Inject 15 Units into the  skin at bedtime.   Yes Historical Provider, MD  IRON PO Take 1 tablet by mouth daily.   Yes Historical Provider, MD  metoprolol (LOPRESSOR) 50 MG tablet Take 50 mg by mouth 2 (two) times daily.   Yes Historical Provider, MD  mometasone (NASONEX) 50 MCG/ACT nasal spray Place 2 sprays into the nose daily as needed. For nasal congestion   Yes Historical Provider, MD  oxyCODONE (ROXICODONE) 5 MG immediate release tablet Take 1 tablet (5 mg total) by mouth every 4 (four) hours as needed for pain. 01/30/13  Yes Regina J Roczniak, PA-C  pregabalin (LYRICA) 50 MG capsule Take 50 mg by mouth 3 (three) times daily.    Yes Historical Provider, MD   Physical Exam: Filed Vitals:   04/18/13 1230  BP: 135/49  Pulse: 61  Temp:   Resp: 18     General: Well developed, well nourished, NAD, appears stated age  HEENT: NCAT, PERRLA, EOMI, Anicteic Sclera, mucous membranes dry. No pharyngeal erythema or exudates  Neck: Supple, no JVD, no masses,  Cardiovascular: S1 S2 auscultated, no rubs, murmurs or gallops.  Regular rate and rhythm.  Respiratory: Clear to auscultation bilaterally   Abdomen: Soft, obese, distended, non-tender to palpation, + bowel sounds  Extremities: warm dry without cyanosis clubbing or edema  Neuro: AAOx3, cranial nerves grossly intact. Strength 4/5 in patient's upper and lower extremities bilaterally  Skin: Without rashes exudates or nodules  Psych: Normal affect and demeanor with intact judgement and insight  Labs on Admission:  Basic Metabolic Panel:  Recent Labs Lab 04/18/13 1055  NA 137  K 4.4  CL 96  CO2 19  GLUCOSE 145*  BUN 110*  CREATININE 7.65*  CALCIUM 8.6   Liver Function Tests: No results found for this basename: AST, ALT, ALKPHOS, BILITOT, PROT, ALBUMIN,  in the last 168 hours No results found for this basename: LIPASE, AMYLASE,  in the last 168 hours No results found for this basename: AMMONIA,  in the last 168 hours CBC:  Recent Labs Lab  04/18/13 1055 04/18/13 1338  WBC 15.3*  --   NEUTROABS 12.6*  --   HGB 4.2* 5.9*  HCT 12.2* 17.6*  MCV 85.9  --   PLT 169  --    Cardiac Enzymes: No results found for this basename: CKTOTAL, CKMB, CKMBINDEX, TROPONINI,  in the last 168 hours  BNP (last 3 results)  Recent Labs  01/08/13 1042 04/18/13 1055  PROBNP 2385.0* 34669.0*   CBG: No results found for this basename: GLUCAP,  in the last 168 hours  Radiological Exams on Admission: Dg Chest 2 View  04/18/2013   CLINICAL DATA:  Short of breath and wheezing  EXAM: CHEST  2 VIEW  COMPARISON:  01/08/2013  FINDINGS: Cardiac enlargement. Diffuse bilateral airspace disease, most consistent with pulmonary edema. Small effusions and mild bibasilar atelectasis.  IMPRESSION: Congestive heart failure with diffuse pulmonary edema.   Electronically Signed   By: Leonette Most  Chestine Spore M.D.   On: 04/18/2013 09:59    EKG: Not in the system.  Assessment/Plan Symptomatic Normacytic Anemia  Patient has a baseline hemoglobin of approximately 10-11. Currently her hemoglobin was found to be 4.2. She will be typed and screened. 4 units of red blood cells have been ordered and should be transfused. Will continue to monitor patient.  Fecal occult blood test is also been ordered along with iron studies including iron TIBC, ferritin, reticulocyte count, peripheral blood smear.  At this time patient denies any hematemesis or hematochezia. She underwent menopause in her 66s. Denies any vaginal bleeding at this time.  Further workup may include CT of the abdomen to evaluate for retroperitoneal bleed difficult studies are negative. Her anemia may be partly due to to her end-stage renal disease.  Syncope  Likely secondary to anemia. However will obtain 2-D echocardiogram to evaluate for left ventricular function as well as carotid artery Doppler. Patient did not hit her head at this time does not have any focal deficits no complaints of headache. Will not obtain CT  head at this time.  End stage renal disease  Patient has an AV fistula in place. However has not been used. The patient's current creatinine is in the sevens it seems that her baseline is currently 5-6. We'll continue to monitor this. May consider nephrology consult.  Will monitor patient's I's and O's. We'll continue Calcitrol.  HTN (hypertension), benign  Continue amlodipine, furosemide, metoprolol, with holding parameters.  Diabetes mellitus  Continue Levemir, NovoLog, insulin sliding scale with Accu-Cheks.  DVT prophylaxis:  SCDs  Code Status: Full  Condition: Guarded  Family Communication: Son and grandson at bedside.  Admission, patient's condition and plan of care including tests being ordered have been discussed with the patient and her family, who indicate understanding and agree with the plan and Code Status.  Disposition Plan: Admitted to step down, inpatient, to midnight  Time spent:  40 minutes  Catha Gosselin [facility] Pager [phone]  If 7PM-7AM, please contact night-coverage [email or web address] Password [facility] 04/18/2013, 2:34 PM

## 2013-04-18 NOTE — ED Notes (Signed)
Results of troponin shown to Dr. Patria Mane

## 2013-04-18 NOTE — ED Notes (Signed)
Dr. Durward Fortes back at bedside

## 2013-04-18 NOTE — ED Notes (Signed)
Patient transported to Vascular Lab. ?

## 2013-04-18 NOTE — ED Notes (Signed)
Delay in lab draw and IV.  Pt is a difficult stick with limited IV access.  Attempted X 1.  IV team called.  RT at bedside. Pt receiving breathing treatments.

## 2013-04-18 NOTE — Progress Notes (Signed)
Pt's nurse was bedside assessing pt when I arrived. Pt recognized me and was alert and talking. Pt said she passed out and didn't know how she got to hospital. I asked who was with her and she said Tyric (family/friend) and said he called the ambulance. Pt was thankful for and said she didn't know what happened. Had prayer w/pt. She was thankful for visit and prayer.  Marjory Lies Chaplain  04/18/13 2200  Clinical Encounter Type  Visited With Patient  Visit Type Initial  Referral From [facility]

## 2013-04-18 NOTE — ED Notes (Signed)
IV team called back. They will come see the patient.

## 2013-04-18 NOTE — ED Notes (Signed)
Report attempted 

## 2013-04-19 ENCOUNTER — Inpatient Hospital Stay (HOSPITAL_COMMUNITY): Payer: Medicare Other

## 2013-04-19 ENCOUNTER — Encounter (HOSPITAL_COMMUNITY): Payer: Self-pay | Admitting: Radiology

## 2013-04-19 DIAGNOSIS — R55 Syncope and collapse: Secondary | ICD-10-CM

## 2013-04-19 DIAGNOSIS — E119 Type 2 diabetes mellitus without complications: Secondary | ICD-10-CM

## 2013-04-19 DIAGNOSIS — D631 Anemia in chronic kidney disease: Secondary | ICD-10-CM | POA: Diagnosis not present

## 2013-04-19 DIAGNOSIS — N186 End stage renal disease: Secondary | ICD-10-CM | POA: Diagnosis not present

## 2013-04-19 DIAGNOSIS — D649 Anemia, unspecified: Secondary | ICD-10-CM | POA: Diagnosis not present

## 2013-04-19 DIAGNOSIS — J9600 Acute respiratory failure, unspecified whether with hypoxia or hypercapnia: Secondary | ICD-10-CM | POA: Diagnosis not present

## 2013-04-19 DIAGNOSIS — D509 Iron deficiency anemia, unspecified: Secondary | ICD-10-CM | POA: Diagnosis not present

## 2013-04-19 DIAGNOSIS — I10 Essential (primary) hypertension: Secondary | ICD-10-CM

## 2013-04-19 DIAGNOSIS — I509 Heart failure, unspecified: Secondary | ICD-10-CM | POA: Diagnosis not present

## 2013-04-19 DIAGNOSIS — N185 Chronic kidney disease, stage 5: Secondary | ICD-10-CM | POA: Diagnosis not present

## 2013-04-19 DIAGNOSIS — E8779 Other fluid overload: Secondary | ICD-10-CM | POA: Diagnosis not present

## 2013-04-19 DIAGNOSIS — N2581 Secondary hyperparathyroidism of renal origin: Secondary | ICD-10-CM | POA: Diagnosis not present

## 2013-04-19 LAB — BASIC METABOLIC PANEL
BUN: 116 mg/dL — ABNORMAL HIGH (ref 6–23)
CO2: 20 mEq/L (ref 19–32)
Calcium: 7.9 mg/dL — ABNORMAL LOW (ref 8.4–10.5)
Chloride: 99 mEq/L (ref 96–112)
Chloride: 99 mEq/L (ref 96–112)
Creatinine, Ser: 7.73 mg/dL — ABNORMAL HIGH (ref 0.50–1.10)
Creatinine, Ser: 8.27 mg/dL — ABNORMAL HIGH (ref 0.50–1.10)
GFR calc Af Amer: 5 mL/min — ABNORMAL LOW (ref >90)
GFR calc non Af Amer: 4 mL/min — ABNORMAL LOW (ref >90)
GFR calc non Af Amer: 4 mL/min — ABNORMAL LOW (ref >90)
Glucose, Bld: 94 mg/dL (ref 70–99)
Potassium: 4.7 mEq/L (ref 3.5–5.1)
Sodium: 139 mEq/L (ref 135–145)
Sodium: 140 mEq/L (ref 135–145)

## 2013-04-19 LAB — CBC
HCT: 28.8 % — ABNORMAL LOW (ref 36.0–46.0)
HCT: 28.7 % — ABNORMAL LOW (ref 36.0–46.0)
Hemoglobin: 9.6 g/dL — ABNORMAL LOW (ref 12.0–15.0)
Hemoglobin: 9.8 g/dL — ABNORMAL LOW (ref 12.0–15.0)
MCH: 28.9 pg (ref 26.0–34.0)
MCHC: 34.1 g/dL (ref 30.0–36.0)
Platelets: 128 10*3/uL — ABNORMAL LOW (ref 150–400)
RBC: 3.31 MIL/uL — ABNORMAL LOW (ref 3.87–5.11)
RDW: 15.7 % — ABNORMAL HIGH (ref 11.5–15.5)
RDW: 16.1 % — ABNORMAL HIGH (ref 11.5–15.5)
WBC: 11.8 10*3/uL — ABNORMAL HIGH (ref 4.0–10.5)
WBC: 11.2 10*3/uL — ABNORMAL HIGH (ref 4.0–10.5)

## 2013-04-19 LAB — GLUCOSE, CAPILLARY
Glucose-Capillary: 113 mg/dL — ABNORMAL HIGH (ref 70–99)
Glucose-Capillary: 122 mg/dL — ABNORMAL HIGH (ref 70–99)

## 2013-04-19 LAB — PROTIME-INR: INR: 1.3 (ref 0.00–1.49)

## 2013-04-19 LAB — IRON AND TIBC: TIBC: 133 ug/dL — ABNORMAL LOW (ref 250–470)

## 2013-04-19 LAB — HEPATITIS B SURFACE ANTIBODY,QUALITATIVE: Hep B S Ab: NEGATIVE

## 2013-04-19 LAB — OCCULT BLOOD X 1 CARD TO LAB, STOOL: Fecal Occult Bld: POSITIVE — AB

## 2013-04-19 LAB — APTT: aPTT: 34 seconds (ref 24–37)

## 2013-04-19 MED ORDER — HEPARIN SODIUM (PORCINE) 1000 UNIT/ML DIALYSIS
1000.0000 [IU] | INTRAMUSCULAR | Status: DC | PRN
  Filled 2013-04-19: qty 1

## 2013-04-19 MED ORDER — LIDOCAINE HCL (PF) 1 % IJ SOLN: 5.0000 mL | INTRAMUSCULAR | Status: DC | PRN

## 2013-04-19 MED ORDER — LIDOCAINE-PRILOCAINE 2.5-2.5 % EX CREA
1.0000 "application " | TOPICAL_CREAM | CUTANEOUS | Status: DC | PRN
  Filled 2013-04-19: qty 5

## 2013-04-19 MED ORDER — PENTAFLUOROPROP-TETRAFLUOROETH EX AERO: 1.0000 "application " | INHALATION_SPRAY | CUTANEOUS | Status: DC | PRN

## 2013-04-19 MED ORDER — CEFAZOLIN SODIUM-DEXTROSE 2-3 GM-% IV SOLR
2.0000 g | INTRAVENOUS | Status: DC
  Filled 2013-04-19: qty 50

## 2013-04-19 MED ORDER — LIDOCAINE-PRILOCAINE 2.5-2.5 % EX CREA: 1.0000 "application " | TOPICAL_CREAM | CUTANEOUS | Status: DC | PRN

## 2013-04-19 MED ORDER — DOXERCALCIFEROL 4 MCG/2ML IV SOLN
INTRAVENOUS | Status: AC
  Administered 2013-04-19: 4 ug via INTRAVENOUS
  Filled 2013-04-19: qty 2

## 2013-04-19 MED ORDER — HEPARIN SODIUM (PORCINE) 1000 UNIT/ML DIALYSIS: 1000.0000 [IU] | INTRAMUSCULAR | Status: DC | PRN

## 2013-04-19 MED ORDER — AZITHROMYCIN 500 MG PO TABS
500.0000 mg | ORAL_TABLET | Freq: Every day | ORAL | Status: DC
  Administered 2013-04-19 – 2013-04-26 (×8): 500 mg via ORAL
  Filled 2013-04-19 (×8): qty 1

## 2013-04-19 MED ORDER — NEPRO/CARBSTEADY PO LIQD
237.0000 mL | ORAL | Status: DC | PRN
  Filled 2013-04-19: qty 237

## 2013-04-19 MED ORDER — ACETAMINOPHEN 325 MG PO TABS
650.0000 mg | ORAL_TABLET | ORAL | Status: AC
  Administered 2013-04-19: 650 mg via ORAL

## 2013-04-19 MED ORDER — SODIUM CHLORIDE 0.9 % IV SOLN: 100.0000 mL | INTRAVENOUS | Status: DC | PRN

## 2013-04-19 MED ORDER — ALTEPLASE 2 MG IJ SOLR
2.0000 mg | Freq: Once | INTRAMUSCULAR | Status: AC | PRN
  Filled 2013-04-19: qty 2

## 2013-04-19 MED ORDER — FUROSEMIDE 10 MG/ML IJ SOLN
INTRAMUSCULAR | Status: AC
  Filled 2013-04-19: qty 8

## 2013-04-19 MED ORDER — HEPARIN SODIUM (PORCINE) 1000 UNIT/ML IJ SOLN
INTRAMUSCULAR | Status: AC
  Filled 2013-04-19: qty 1

## 2013-04-19 MED ORDER — NEPRO/CARBSTEADY PO LIQD: 237.0000 mL | ORAL | Status: DC | PRN

## 2013-04-19 MED ORDER — DARBEPOETIN ALFA-POLYSORBATE 60 MCG/0.3ML IJ SOLN
60.0000 ug | Freq: Once | INTRAMUSCULAR | Status: AC
  Administered 2013-04-19: 60 ug via INTRAVENOUS
  Filled 2013-04-19: qty 0.3

## 2013-04-19 MED ORDER — PREGABALIN 50 MG PO CAPS
50.0000 mg | ORAL_CAPSULE | Freq: Every day | ORAL | Status: DC
  Administered 2013-04-20 – 2013-04-25 (×6): 50 mg via ORAL
  Filled 2013-04-19 (×6): qty 1

## 2013-04-19 MED ORDER — DOXERCALCIFEROL 4 MCG/2ML IV SOLN
4.0000 ug | INTRAVENOUS | Status: DC
  Administered 2013-04-19 – 2013-04-21 (×2): 4 ug via INTRAVENOUS
  Filled 2013-04-19 (×4): qty 2

## 2013-04-19 MED ORDER — FUROSEMIDE 10 MG/ML IJ SOLN
60.0000 mg | Freq: Once | INTRAMUSCULAR | Status: AC
  Administered 2013-04-19: 60 mg via INTRAVENOUS

## 2013-04-19 MED ORDER — ACETAMINOPHEN 325 MG PO TABS
ORAL_TABLET | ORAL | Status: AC
  Filled 2013-04-19: qty 2

## 2013-04-19 MED ORDER — ALTEPLASE 2 MG IJ SOLR: 2.0000 mg | Freq: Once | INTRAMUSCULAR | Status: DC | PRN

## 2013-04-19 NOTE — Progress Notes (Addendum)
Shift event: RN paged NP secondary to pt's lungs being "wet" after receiving 2 U PRBCs for anemia. Pt came in with elevated proBNP but this NP didn't see CHF on list of PMHx diagnoses. Pt takes Lasix 80mg  po bid at home, but hasn't received any since admission. Her BP is slightly elevated in the 160s (looks like she normally runs 140s). She is not in any resp distress at this time. CXR 04/18/13 showed CHF with pulmonary edema. Plan: Hold units #3, #4 for now given signs of fluid overload. Will give Lasix 60mg  IV now. Recheck CBC and BMP to f/up hgb and creat (pt has CKD with lowest creat on our chart being in the 5's range). Creat here 7.45. Has HD access but doesn't receive HD. Pt for echo in am. Will follow. Jimmye Norman, NP [facility] RN paged NP after labs resulted. Creat is just slightly more elevated. K is normal. CO2 slightly lower. Hgb up to 9.9 after 2 U (but was drawn just minutes after blood stopped). Given overload, will hold other 2 U of blood.Recheck CBC around 9am to check true results. Pt has urinated 300cc after the 60 mg of Lasix IV. She may need more. Her O2 sats are around 90% on 5L O2 but her resp effort is not increased and her rate is 17-24. Will change to venti mask and titrate as needed. The pt says her breathing is "fine". I asked her why she had HD access and hasn't started HD. She said she would not want to have dialysis. So that would need to be further addressed should she need HD. Monitor carefully for acidosis. Jimmye Norman, NP [facility]

## 2013-04-19 NOTE — Progress Notes (Signed)
Patient ID: Jackie [name], female   DOB: [birth year], 78 y.o.   MRN: [id number] [facility] PROGRESS NOTE  Jackie [name] WUJ:[id number] DOB: [birth year] DOA: 04/18/2013 PCP: Jackie Courier, MD  Assessment/Plan: 1. Anemia: hgb 9.8 on 8/4 and 4.2 on 9/30>> 5 g drop in two months.  She has ESRD with baseline anemia, but she has had an acute blood loss.  She reports chronic mild abdominal pain, denies melana, hematochezia, vomiting.  She has never had a colonoscopy or EGD.  She was taking ASA 325 mg daily at home. Will consult GI to see today for possible inpatient GI work up.  She has responded as expected to 4 U prbc's with 5 g increase.  Continue to monitor. Does not seem to be actively bleeding at this time. 2. Syncope: Likely due to anemia. 3. Hypoxia: She reports cough and shortness of breath prior to admission.  I do not see any lung disease or CHF noted on prior notes.  CXR shows pulmonary edema 9/30 and worsening edema this morning after transfusion.  BNP was >2000 on admission.  ECHO pending for this morning. She is doing well on nasal canula for now. Will give one dose of lasix 160 mg at this time, and if does well will not continue. 4. HTN: controled. No changes 5. DM: blood sugars controled. No changes 6. ESRD: Does not currently receive HD.  Cr 7, gfr 5. Not a significant change from baseline. Fistula in right arm. Will alert renal team.  Code Status: Full Family Communication: spoke with patient at bedside Disposition Plan: remain in SDU. Continue to wean off facemask to nasal canula. Check cbc this pm.   Consultants:  Gastroenterology Jackie [name]  Nephrology  Procedures:  Transfusion 4 U prbc's 9/30  Antibiotics:  none  HPI/Subjective: Jackie [name] is a 78 year old woman with ESRD, HTN, DM, chronic anemia with a baseline hgb in the 9 range who presented on 9/30 after a syncopal event and was found to have a hgb of 4.2.  This morning she is doing well.  She had some  hypoxia overnight and is now doing well on nasal canula eating breakfast.  She has no specific complaints.  Objective: Filed Vitals:   04/19/13 0800  BP: 141/55  Pulse: 55  Temp: 98.7 F (37.1 C)  Resp: 11    Intake/Output Summary (Last 24 hours) at 04/19/13 0911 Last data filed at 04/19/13 0400  Gross per 24 hour  Intake   1420 ml  Output    300 ml  Net   1120 ml   Filed Weights   04/19/13 0435  Weight: 72.7 kg (160 lb 4.4 oz)    Exam:   General:  NAD eating breakfast  Cardiovascular: distant, rrr no mrg  Respiratory: diffuse rhonchi and wheezes  Abdomen: bs normal, soft, non tender  Data Reviewed: Basic Metabolic Panel:  Recent Labs Lab 04/18/13 1055 04/19/13 0129  NA 137 139  K 4.4 4.9  CL 96 99  CO2 19 18*  GLUCOSE 145* 162*  BUN 110* 116*  CREATININE 7.65* 7.73*  CALCIUM 8.6 7.9*   Liver Function Tests: No results found for this basename: AST, ALT, ALKPHOS, BILITOT, PROT, ALBUMIN,  in the last 168 hours No results found for this basename: LIPASE, AMYLASE,  in the last 168 hours No results found for this basename: AMMONIA,  in the last 168 hours CBC:  Recent Labs Lab 04/18/13 1055 04/18/13 1338 04/19/13 0129  WBC 15.3*  --  11.8*  NEUTROABS 12.6*  --   --   HGB 4.2* 5.9* 9.9*  HCT 12.2* 17.6* 28.8*  MCV 85.9  --  85.5  PLT 169  --  128*   Cardiac Enzymes: No results found for this basename: CKTOTAL, CKMB, CKMBINDEX, TROPONINI,  in the last 168 hours BNP (last 3 results)  Recent Labs  01/08/13 1042 04/18/13 1055  PROBNP 2385.0* 34669.0*   CBG:  Recent Labs Lab 04/18/13 1900 04/18/13 2206 04/19/13 0805  GLUCAP 145* 236* 113*    Recent Results (from the past 240 hour(s))  CULTURE, BLOOD (ROUTINE X 2)     Status: None   Collection Time    04/18/13  2:18 PM      Result Value Range Status   Specimen Description BLOOD   Final   Special Requests BOTTLES DRAWN AEROBIC AND ANAEROBIC 10CC LEFT EJ   Final   Culture  Setup Time      Final   Value: 04/18/2013 20:50     Performed at [facility]   Culture     Final   Value:        BLOOD CULTURE RECEIVED NO GROWTH TO DATE CULTURE WILL BE HELD FOR 5 DAYS BEFORE ISSUING A FINAL NEGATIVE REPORT     Performed at [facility]   Report Status PENDING   Incomplete  CULTURE, BLOOD (ROUTINE X 2)     Status: None   Collection Time    04/18/13  2:25 PM      Result Value Range Status   Specimen Description BLOOD HAND LEFT   Final   Special Requests BOTTLES DRAWN AEROBIC ONLY 6CC   Final   Culture  Setup Time     Final   Value: 04/18/2013 20:50     Performed at [facility]   Culture     Final   Value:        BLOOD CULTURE RECEIVED NO GROWTH TO DATE CULTURE WILL BE HELD FOR 5 DAYS BEFORE ISSUING A FINAL NEGATIVE REPORT     Performed at [facility]   Report Status PENDING   Incomplete  MRSA PCR SCREENING     Status: None   Collection Time    04/18/13  7:59 PM      Result Value Range Status   MRSA by PCR NEGATIVE  NEGATIVE Final   Comment:            The GeneXpert MRSA Assay (FDA     approved for NASAL specimens     only), is one component of a     comprehensive MRSA colonization     surveillance program. It is not     intended to diagnose MRSA     infection nor to guide or     monitor treatment for     MRSA infections.     Studies: Dg Chest 2 View  04/18/2013   CLINICAL DATA:  Short of breath and wheezing  EXAM: CHEST  2 VIEW  COMPARISON:  01/08/2013  FINDINGS: Cardiac enlargement. Diffuse bilateral airspace disease, most consistent with pulmonary edema. Small effusions and mild bibasilar atelectasis.  IMPRESSION: Congestive heart failure with diffuse pulmonary edema.   Electronically Signed   By: Marlan Palau M.D.   On: 04/18/2013 09:59   Dg Chest Port 1 View  04/19/2013   CLINICAL DATA:  Fever, CHF, short of breath  EXAM: PORTABLE CHEST - 1 VIEW  COMPARISON:  04/18/2013.  FINDINGS: Diffuse bilateral airspace  disease remains present.  Cardiomegaly with partially obscured pericardial silhouette by airspace disease. In the right upper lobe, the airspace disease appears more prominent. The differential considerations include multifocal pneumonia, pulmonary edema or a combination of these 2 entities. There is no pleural effusion or pneumothorax identified. Monitoring leads project over the chest.  IMPRESSION: Slight worsening of airspace disease, likely representing asymmetric pulmonary edema and CHF.   Electronically Signed   By: Andreas Newport M.D.   On: 04/19/2013 07:05    Scheduled Meds: . amLODipine  10 mg Oral Daily  . aspirin  325 mg Oral Daily  . calcitRIOL  0.25 mcg Oral Daily  . fluticasone  1 spray Each Nare Daily  . furosemide  80 mg Oral BID  . insulin aspart  0-15 Units Subcutaneous TID WC  . insulin detemir  15 Units Subcutaneous QHS  . metoprolol  50 mg Oral BID  . pregabalin  50 mg Oral TID   Continuous Infusions:   Principal Problem:   Anemia Active Problems:   End stage renal disease   HTN (hypertension), benign   Diabetes mellitus   Syncope    Time spent: 45 minutes   [facility]  [facility] Pager [phone]. If 7PM-7AM, please contact night-coverage at [email or web address], password [facility] 04/19/2013, 9:11 AM  LOS: 1 day

## 2013-04-19 NOTE — Consult Note (Signed)
Subjective:   HPI  The patient is a 78 year old female with multiple medical problems including end-stage renal disease, hypertension, and diabetes mellitus. She presented to the hospital and was subsequently admitted after having had a syncopal episode. She was found to be severely anemic with a hemoglobin of 4.2. It was noted that a couple of months ago her hemoglobin was 9.8. She has not noticed any bleeding. She denies severe heartburn, vomiting, hematemesis, melena, or hematochezia. Of note is that in the emergency room her stools were checked and were Hemoccult negative.  Review of Systems Denies chest pain. Has been feeling somewhat weak.  Past Medical History  Diagnosis Date  . Hypertension   . Diabetes mellitus   . Chronic kidney disease   . Thyroid disease   . Anemia   . Hyperlipidemia   . Arthritis    Past Surgical History  Procedure Laterality Date  . Arteriovenous graft placement Left     non function  . Eye surgery Bilateral     catarct  . Cesarean section    . Av fistula placement Right 01/30/2013    Procedure: ARTERIOVENOUS (AV) FISTULA CREATION;  Surgeon: Fransisco Hertz, MD;  Location: [facility] OR;  Service: Vascular;  Laterality: Right;   History   Social History  . Marital Status: Widowed    Spouse Name: N/A    Number of Children: N/A  . Years of Education: N/A   Occupational History  . Not on file.   Social History Main Topics  . Smoking status: Never Smoker   . Smokeless tobacco: Never Used  . Alcohol Use: No  . Drug Use: No  . Sexual Activity: Not on file   Other Topics Concern  . Not on file   Social History Narrative  . No narrative on file   family history includes Diabetes in her mother; Heart disease in her mother; Hypertension in her mother. Current facility-administered medications:0.9 %  sodium chloride infusion, 100 mL, Intravenous, PRN, Garnetta Buddy, MD;  0.9 %  sodium chloride infusion, 100 mL, Intravenous, PRN, Garnetta Buddy, MD;  0.9 %   sodium chloride infusion, 100 mL, Intravenous, PRN, Garnetta Buddy, MD;  acetaminophen (TYLENOL) suppository 650 mg, 650 mg, Rectal, Q6H PRN, Maryann Mikhail, DO acetaminophen (TYLENOL) tablet 650 mg, 650 mg, Oral, Q6H PRN, Maryann Mikhail, DO, 650 mg at 04/19/13 0430;  albuterol (PROVENTIL HFA;VENTOLIN HFA) 108 (90 BASE) MCG/ACT inhaler 1-2 puff, 1-2 puff, Inhalation, Q6H PRN, Maryann Mikhail, DO;  alteplase (CATHFLO ACTIVASE) injection 2 mg, 2 mg, Intracatheter, Once PRN, Garnetta Buddy, MD;  amLODipine (NORVASC) tablet 10 mg, 10 mg, Oral, Daily, Maryann Mikhail, DO aspirin tablet 325 mg, 325 mg, Oral, Daily, Maryann Mikhail, DO;  darbepoetin (ARANESP) injection 60 mcg, 60 mcg, Intravenous, Once, Garnetta Buddy, MD;  doxercalciferol (HECTOROL) injection 4 mcg, 4 mcg, Intravenous, Q M,W,F-HD, Garnetta Buddy, MD;  feeding supplement (NEPRO CARB STEADY) liquid 237 mL, 237 mL, Oral, PRN, Garnetta Buddy, MD;  fluticasone [facility]) 50 MCG/ACT nasal spray 1 spray, 1 spray, Each Nare, Daily, Maryann Mikhail, DO heparin injection 1,000 Units, 1,000 Units, Dialysis, PRN, Garnetta Buddy, MD;  insulin aspart (novoLOG) injection 0-15 Units, 0-15 Units, Subcutaneous, TID WC, Maryann Mikhail, DO, 2 Units at 04/18/13 1904;  insulin detemir (LEVEMIR) injection 15 Units, 15 Units, Subcutaneous, QHS, Maryann Mikhail, DO, 15 Units at 04/18/13 2247;  lidocaine (PF) (XYLOCAINE) 1 % injection 5 mL, 5 mL, Intradermal, PRN, Garnetta Buddy, MD lidocaine-prilocaine (EMLA)  cream 1 application, 1 application, Topical, PRN, Garnetta Buddy, MD;  metoprolol (LOPRESSOR) tablet 50 mg, 50 mg, Oral, BID, Maryann Mikhail, DO;  morphine 2 MG/ML injection 1 mg, 1 mg, Intravenous, Q4H PRN, Maryann Mikhail, DO;  ondansetron (ZOFRAN) injection 4 mg, 4 mg, Intravenous, Q6H PRN, Maryann Mikhail, DO;  ondansetron (ZOFRAN) tablet 4 mg, 4 mg, Oral, Q6H PRN, Nita Sells Mikhail, DO pentafluoroprop-tetrafluoroeth (GEBAUERS) aerosol 1 application, 1 application,  Topical, PRN, Garnetta Buddy, MD;  Melene Muller ON 04/20/2013] pregabalin (LYRICA) capsule 50 mg, 50 mg, Oral, Daily, Garnetta Buddy, MD No Known Allergies   Objective:     BP 132/71  Pulse 58  Temp(Src) 99.1 F (37.3 C) (Oral)  Resp 27  Wt 73.5 kg (162 lb 0.6 oz)  BMI 23.25 kg/m2  SpO2 92%  She is in no distress  Nonicteric  Heart regular rhythm no murmurs  Lungs clear  Abdomen: Bowel sounds normal, soft, nontender  Iron studies do reveal a slightly decreased iron saturation.  Laboratory No components found with this basename: d1      Assessment:     Severe anemia  End-stage renal disease  Multiple other medical problems as noted above        Plan:     From a GI standpoint the patient will eventually probably undergo EGD and colonoscopy to evaluate both the upper and lower GI tracts for a source of anemia. In the meanwhile her most pressing problem is that of renal failure and she is being taken today as well as tomorrow to dialysis. There does not appear to be any evidence of gastrointestinal bleeding at this time. Her stools were heme negative and she has not noticed any bleeding whatsoever.  Lab Results  Component Value Date   HGB 9.6* 04/19/2013   HGB 9.9* 04/19/2013   HGB 5.9* 04/18/2013   HCT 28.0* 04/19/2013   HCT 28.8* 04/19/2013   HCT 17.6* 04/18/2013   ALKPHOS 68 01/09/2013   ALKPHOS 84 08/21/2012   ALKPHOS 84 05/21/2010   AST 13 01/09/2013   AST 17 08/21/2012   AST 22 05/21/2010   ALT 7 01/09/2013   ALT 9 08/21/2012   ALT 12 05/21/2010

## 2013-04-19 NOTE — H&P (Signed)
Jackie [name] is an 78 y.o. female.   Chief Complaint: chronic renal disease; weak; fatigue Worsening renal functions (BUN: 120/ Cr 8.2) Need for urgent dialysis Rt arm fistula placed 01/30/13 Never used til today "pulling clots" Request made for hemodialysis catheter placement T max 101.4; wbc 11.2 Will place temporary for now Dr Hyman Hopes aware; agreeable HPI: ESRD; HTN; DM; HLD  Past Medical History  Diagnosis Date  . Hypertension   . Diabetes mellitus   . Chronic kidney disease   . Thyroid disease   . Anemia   . Hyperlipidemia   . Arthritis     Past Surgical History  Procedure Laterality Date  . Arteriovenous graft placement Left     non function  . Eye surgery Bilateral     catarct  . Cesarean section    . Av fistula placement Right 01/30/2013    Procedure: ARTERIOVENOUS (AV) FISTULA CREATION;  Surgeon: Fransisco Hertz, MD;  Location: [facility] OR;  Service: Vascular;  Laterality: Right;    Family History  Problem Relation Age of Onset  . Diabetes Mother   . Heart disease Mother   . Hypertension Mother    Social History:  reports that she has never smoked. She has never used smokeless tobacco. She reports that she does not drink alcohol or use illicit drugs.  Allergies: No Known Allergies  Medications Prior to Admission  Medication Sig Dispense Refill  . albuterol (PROVENTIL HFA;VENTOLIN HFA) 108 (90 BASE) MCG/ACT inhaler Inhale 1-2 puffs into the lungs every 6 (six) hours as needed for wheezing.  1 Inhaler  0  . ALPRAZolam (XANAX) 0.25 MG tablet Take 1 tablet (0.25 mg total) by mouth 3 (three) times daily as needed for sleep or anxiety.  15 tablet  0  . amLODipine (NORVASC) 10 MG tablet Take 10 mg by mouth daily.        Marland Kitchen aspirin 325 MG tablet Take 325 mg by mouth daily.      . Aspirin Effervescent (ALKA-SELTZER PO) Take 1 tablet by mouth as needed. For indigestion and heartburn      . calcitRIOL (ROCALTROL) 0.25 MCG capsule Take 0.25 mcg by mouth daily.      . Calcium  Carbonate Antacid (TUMS ULTRA PO) Take 1 tablet by mouth daily as needed. For acid reflux      . FOSRENOL 1000 MG chewable tablet Chew 1,000 mg by mouth 3 (three) times daily with meals.       . furosemide (LASIX) 80 MG tablet Take 80 mg by mouth 2 (two) times daily.        . insulin aspart (NOVOLOG) 100 UNIT/ML injection Inject 1-15 Units into the skin daily.        . insulin detemir (LEVEMIR) 100 UNIT/ML injection Inject 15 Units into the skin at bedtime.      . IRON PO Take 1 tablet by mouth daily.      . metoprolol (LOPRESSOR) 50 MG tablet Take 50 mg by mouth 2 (two) times daily.      . mometasone (NASONEX) 50 MCG/ACT nasal spray Place 2 sprays into the nose daily as needed. For nasal congestion      . oxyCODONE (ROXICODONE) 5 MG immediate release tablet Take 1 tablet (5 mg total) by mouth every 4 (four) hours as needed for pain.  30 tablet  0  . pregabalin (LYRICA) 50 MG capsule Take 50 mg by mouth 3 (three) times daily.         Results for  orders placed during the hospital encounter of 04/18/13 (from the past 48 hour(s))  CBC WITH DIFFERENTIAL     Status: Abnormal   Collection Time    04/18/13 10:55 AM      Result Value Range   WBC 15.3 (*) 4.0 - 10.5 K/uL   RBC 1.42 (*) 3.87 - 5.11 MIL/uL   Hemoglobin 4.2 (*) 12.0 - 15.0 g/dL   Comment: REPEATED TO VERIFY     CRITICAL RESULT CALLED TO, READ BACK BY AND VERIFIED WITH:     C GROSE,RN 1128 04/18/13 D BRADLEY   HCT 12.2 (*) 36.0 - 46.0 %   MCV 85.9  78.0 - 100.0 fL   MCH 29.6  26.0 - 34.0 pg   MCHC 34.4  30.0 - 36.0 g/dL   RDW 60.4 (*) 54.0 - 98.1 %   Platelets 169  150 - 400 K/uL   Neutrophils Relative % 82 (*) 43 - 77 %   Neutro Abs 12.6 (*) 1.7 - 7.7 K/uL   Lymphocytes Relative 11 (*) 12 - 46 %   Lymphs Abs 1.6  0.7 - 4.0 K/uL   Monocytes Relative 7  3 - 12 %   Monocytes Absolute 1.1 (*) 0.1 - 1.0 K/uL   Eosinophils Relative 0  0 - 5 %   Eosinophils Absolute 0.0  0.0 - 0.7 K/uL   Basophils Relative 0  0 - 1 %   Basophils  Absolute 0.0  0.0 - 0.1 K/uL  BASIC METABOLIC PANEL     Status: Abnormal   Collection Time    04/18/13 10:55 AM      Result Value Range   Sodium 137  135 - 145 mEq/L   Potassium 4.4  3.5 - 5.1 mEq/L   Chloride 96  96 - 112 mEq/L   CO2 19  19 - 32 mEq/L   Glucose, Bld 145 (*) 70 - 99 mg/dL   BUN 191 (*) 6 - 23 mg/dL   Creatinine, Ser 4.78 (*) 0.50 - 1.10 mg/dL   Calcium 8.6  8.4 - 29.5 mg/dL   GFR calc non Af Amer 5 (*) >90 mL/min   GFR calc Af Amer 5 (*) >90 mL/min   Comment: (NOTE)     The eGFR has been calculated using the CKD EPI equation.     This calculation has not been validated in all clinical situations.     eGFR's persistently <90 mL/min signify possible Chronic Kidney     Disease.  PRO B NATRIURETIC PEPTIDE     Status: Abnormal   Collection Time    04/18/13 10:55 AM      Result Value Range   Pro B Natriuretic peptide (BNP) 34669.0 (*) 0 - 450 pg/mL  LACTATE DEHYDROGENASE     Status: Abnormal   Collection Time    04/18/13 10:55 AM      Result Value Range   LDH 339 (*) 94 - 250 U/L  POCT I-STAT TROPONIN I     Status: Abnormal   Collection Time    04/18/13 11:10 AM      Result Value Range   Troponin i, poc 0.17 (*) 0.00 - 0.08 ng/mL   Comment NOTIFIED PHYSICIAN     Comment 3            Comment: Due to the release kinetics of cTnI,     a negative result within the first hours     of the onset of symptoms does not rule out  myocardial infarction with certainty.     If myocardial infarction is still suspected,     repeat the test at appropriate intervals.  OCCULT BLOOD, POC DEVICE     Status: None   Collection Time    04/18/13 12:43 PM      Result Value Range   Fecal Occult Bld NEGATIVE  NEGATIVE  RETICULOCYTES     Status: Abnormal   Collection Time    04/18/13 12:49 PM      Result Value Range   Retic Ct Pct 2.1  0.4 - 3.1 %   RBC. 2.03 (*) 3.87 - 5.11 MIL/uL   Retic Count, Manual 42.6  19.0 - 186.0 K/uL  PREPARE RBC (CROSSMATCH)     Status: None    Collection Time    04/18/13  1:38 PM      Result Value Range   Order Confirmation ORDER PROCESSED BY BLOOD BANK    TYPE AND SCREEN     Status: None   Collection Time    04/18/13  1:38 PM      Result Value Range   ABO/RH(D) A POS     Antibody Screen NEG     Sample Expiration 04/21/2013     Unit Number [id number]     Blood Component Type RED CELLS,LR     Unit division 00     Status of Unit ISSUED,FINAL     Transfusion Status OK TO TRANSFUSE     Crossmatch Result Compatible     Unit Number [id number]     Blood Component Type RED CELLS,LR     Unit division 00     Status of Unit ISSUED,FINAL     Transfusion Status OK TO TRANSFUSE     Crossmatch Result Compatible     Unit Number [id number]     Blood Component Type RED CELLS,LR     Unit division 00     Status of Unit ALLOCATED     Transfusion Status OK TO TRANSFUSE     Crossmatch Result Compatible     Unit Number [id number]     Blood Component Type RED CELLS,LR     Unit division 00     Status of Unit ALLOCATED     Transfusion Status OK TO TRANSFUSE     Crossmatch Result Compatible    VITAMIN B12     Status: None   Collection Time    04/18/13  1:38 PM      Result Value Range   Vitamin B-12 581  211 - 911 pg/mL   Comment: Performed at [facility]  FOLATE     Status: None   Collection Time    04/18/13  1:38 PM      Result Value Range   Folate >20.0     Comment: (NOTE)     Reference Ranges            Deficient:       0.4 - 3.3 ng/mL            Indeterminate:   3.4 - 5.4 ng/mL            Normal:              > 5.4 ng/mL     Performed at [facility]  IRON AND TIBC     Status: Abnormal   Collection Time    04/18/13  1:38 PM      Result Value Range   Iron 27 (*) 42 - 135  ug/dL   TIBC 161 (*) 096 - 045 ug/dL   Saturation Ratios 18 (*) 20 - 55 %   UIBC 122 (*) 125 - 400 ug/dL   Comment: Performed at [facility]  FERRITIN     Status: Abnormal   Collection Time    04/18/13  1:38 PM       Result Value Range   Ferritin 4114 (*) 10 - 291 ng/mL   Comment: Result confirmed by automatic dilution.     Performed at [facility]  HEMOGLOBIN AND HEMATOCRIT, BLOOD     Status: Abnormal   Collection Time    04/18/13  1:38 PM      Result Value Range   Hemoglobin 5.9 (*) 12.0 - 15.0 g/dL   Comment: CRITICAL VALUE NOTED.  VALUE IS CONSISTENT WITH PREVIOUSLY REPORTED AND CALLED VALUE.   HCT 17.6 (*) 36.0 - 46.0 %  ABO/RH     Status: None   Collection Time    04/18/13  1:38 PM      Result Value Range   ABO/RH(D) A POS    CULTURE, BLOOD (ROUTINE X 2)     Status: None   Collection Time    04/18/13  2:18 PM      Result Value Range   Specimen Description BLOOD     Special Requests BOTTLES DRAWN AEROBIC AND ANAEROBIC 10CC LEFT EJ     Culture  Setup Time       Value: 04/18/2013 20:50     Performed at [facility]   Culture       Value:        BLOOD CULTURE RECEIVED NO GROWTH TO DATE CULTURE WILL BE HELD FOR 5 DAYS BEFORE ISSUING A FINAL NEGATIVE REPORT     Performed at [facility]   Report Status PENDING    CULTURE, BLOOD (ROUTINE X 2)     Status: None   Collection Time    04/18/13  2:25 PM      Result Value Range   Specimen Description BLOOD HAND LEFT     Special Requests BOTTLES DRAWN AEROBIC ONLY 6CC     Culture  Setup Time       Value: 04/18/2013 20:50     Performed at [facility]   Culture       Value:        BLOOD CULTURE RECEIVED NO GROWTH TO DATE CULTURE WILL BE HELD FOR 5 DAYS BEFORE ISSUING A FINAL NEGATIVE REPORT     Performed at [facility]   Report Status PENDING    GLUCOSE, CAPILLARY     Status: Abnormal   Collection Time    04/18/13  7:00 PM      Result Value Range   Glucose-Capillary 145 (*) 70 - 99 mg/dL   Comment 1 Documented in Chart     Comment 2 Notify RN    MRSA PCR SCREENING     Status: None   Collection Time    04/18/13  7:59 PM      Result Value Range   MRSA by PCR NEGATIVE  NEGATIVE   Comment:             The GeneXpert MRSA Assay (FDA     approved for NASAL specimens     only), is one component of a     comprehensive MRSA colonization     surveillance program. It is not     intended to diagnose MRSA  infection nor to guide or     monitor treatment for     MRSA infections.  GLUCOSE, CAPILLARY     Status: Abnormal   Collection Time    04/18/13 10:06 PM      Result Value Range   Glucose-Capillary 236 (*) 70 - 99 mg/dL   Comment 1 Notify RN     Comment 2 Documented in Chart    BASIC METABOLIC PANEL     Status: Abnormal   Collection Time    04/19/13  1:29 AM      Result Value Range   Sodium 139  135 - 145 mEq/L   Potassium 4.9  3.5 - 5.1 mEq/L   Chloride 99  96 - 112 mEq/L   CO2 18 (*) 19 - 32 mEq/L   Glucose, Bld 162 (*) 70 - 99 mg/dL   BUN 161 (*) 6 - 23 mg/dL   Creatinine, Ser 0.96 (*) 0.50 - 1.10 mg/dL   Calcium 7.9 (*) 8.4 - 10.5 mg/dL   GFR calc non Af Amer 4 (*) >90 mL/min   GFR calc Af Amer 5 (*) >90 mL/min   Comment: (NOTE)     The eGFR has been calculated using the CKD EPI equation.     This calculation has not been validated in all clinical situations.     eGFR's persistently <90 mL/min signify possible Chronic Kidney     Disease.  CBC     Status: Abnormal   Collection Time    04/19/13  1:29 AM      Result Value Range   WBC 11.8 (*) 4.0 - 10.5 K/uL   RBC 3.37 (*) 3.87 - 5.11 MIL/uL   Hemoglobin 9.9 (*) 12.0 - 15.0 g/dL   Comment: DELTA CHECK NOTED     POST TRANSFUSION SPECIMEN   HCT 28.8 (*) 36.0 - 46.0 %   MCV 85.5  78.0 - 100.0 fL   MCH 29.4  26.0 - 34.0 pg   MCHC 34.4  30.0 - 36.0 g/dL   RDW 04.5 (*) 40.9 - 81.1 %   Platelets 128 (*) 150 - 400 K/uL   Comment: PLATELET COUNT CONFIRMED BY SMEAR  GLUCOSE, CAPILLARY     Status: Abnormal   Collection Time    04/19/13  8:05 AM      Result Value Range   Glucose-Capillary 113 (*) 70 - 99 mg/dL   Comment 1 Notify RN     Comment 2 Documented in Chart    CBC     Status: Abnormal   Collection Time     04/19/13  9:25 AM      Result Value Range   WBC 11.2 (*) 4.0 - 10.5 K/uL   RBC 3.31 (*) 3.87 - 5.11 MIL/uL   Hemoglobin 9.6 (*) 12.0 - 15.0 g/dL   HCT 91.4 (*) 78.2 - 95.6 %   MCV 84.6  78.0 - 100.0 fL   MCH 29.0  26.0 - 34.0 pg   MCHC 34.3  30.0 - 36.0 g/dL   RDW 21.3 (*) 08.6 - 57.8 %   Platelets 127 (*) 150 - 400 K/uL  BASIC METABOLIC PANEL     Status: Abnormal   Collection Time    04/19/13  9:25 AM      Result Value Range   Sodium 140  135 - 145 mEq/L   Potassium 4.7  3.5 - 5.1 mEq/L   Chloride 99  96 - 112 mEq/L   CO2 20  19 - 32  mEq/L   Glucose, Bld 94  70 - 99 mg/dL   BUN 161 (*) 6 - 23 mg/dL   Creatinine, Ser 0.96 (*) 0.50 - 1.10 mg/dL   Calcium 8.1 (*) 8.4 - 10.5 mg/dL   GFR calc non Af Amer 4 (*) >90 mL/min   GFR calc Af Amer 5 (*) >90 mL/min   Comment: (NOTE)     The eGFR has been calculated using the CKD EPI equation.     This calculation has not been validated in all clinical situations.     eGFR's persistently <90 mL/min signify possible Chronic Kidney     Disease.  GLUCOSE, CAPILLARY     Status: Abnormal   Collection Time    04/19/13 12:33 PM      Result Value Range   Glucose-Capillary 122 (*) 70 - 99 mg/dL   Comment 1 Documented in Chart     Comment 2 Notify RN     Dg Chest 2 View  04/18/2013   CLINICAL DATA:  Short of breath and wheezing  EXAM: CHEST  2 VIEW  COMPARISON:  01/08/2013  FINDINGS: Cardiac enlargement. Diffuse bilateral airspace disease, most consistent with pulmonary edema. Small effusions and mild bibasilar atelectasis.  IMPRESSION: Congestive heart failure with diffuse pulmonary edema.   Electronically Signed   By: Marlan Palau M.D.   On: 04/18/2013 09:59   Dg Chest Port 1 View  04/19/2013   CLINICAL DATA:  Fever, CHF, short of breath  EXAM: PORTABLE CHEST - 1 VIEW  COMPARISON:  04/18/2013.  FINDINGS: Diffuse bilateral airspace disease remains present. Cardiomegaly with partially obscured pericardial silhouette by airspace disease. In the  right upper lobe, the airspace disease appears more prominent. The differential considerations include multifocal pneumonia, pulmonary edema or a combination of these 2 entities. There is no pleural effusion or pneumothorax identified. Monitoring leads project over the chest.  IMPRESSION: Slight worsening of airspace disease, likely representing asymmetric pulmonary edema and CHF.   Electronically Signed   By: Andreas Newport M.D.   On: 04/19/2013 07:05    Review of Systems  Constitutional: Positive for fever. Negative for weight loss.  HENT: Negative for neck pain.   Respiratory: Positive for shortness of breath.   Cardiovascular: Negative for chest pain.  Gastrointestinal: Negative for nausea, vomiting and abdominal pain.  Musculoskeletal: Negative for back pain.  Neurological: Positive for weakness and headaches.    Blood pressure 132/71, pulse 62, temperature 99.1 F (37.3 C), temperature source Oral, resp. rate 19, weight 162 lb 0.6 oz (73.5 kg), SpO2 91.00%. Physical Exam  Constitutional: She appears well-developed.  Cardiovascular: Normal rate, regular rhythm and normal heart sounds.   No murmur heard. Respiratory: Effort normal. She has wheezes.  GI: Soft. Bowel sounds are normal. She exhibits no distension.  Musculoskeletal: Normal range of motion.  Very weak Rt arm fistula; no pulse  Neurological: She is alert.  Skin: Skin is warm and dry.  Psychiatric: She has a normal mood and affect. Her behavior is normal.     Assessment/Plan Worsening renal fxn Attempted first use of Rt arm dialysis fistula- pulling clots Need urgent dialysis per Dr Hyman Hopes request for HD catheter Needs to be temp cath- T 101.4; wbc 11.2 Pt aware of procedure benefits and risks and agreeable to proceed Consent signed and  In chart  Valeria Boza A 04/19/2013, 12:52 PM

## 2013-04-19 NOTE — Procedures (Signed)
Interventional Radiology Procedure Note  Procedure:  Successful placement of right IJ temporary HD catheter.  Tip in upper RA and ready for use.  Complications:  None Recommendations: - Routine line care  Signed,  Sterling Big, MD [facility]

## 2013-04-19 NOTE — Progress Notes (Signed)
Patient went to dialysis around 3pm this afternoon in her bed.

## 2013-04-19 NOTE — Consult Note (Signed)
Referring Provider: No ref. provider found Primary Care Physician:  Evlyn Courier, MD Primary Nephrologist:  Dr. Darrick Penna  Reason for Consultation:  Management of CKD 5  HPI: Jackie [name] is a 78 y.o. female with a history of hypertension, diabetes mellitus, chronic kidney disease with AV fistula in place.  Currently her hemoglobin was found to be 4.2. She will be typed and screened. 5 units of red blood cells have been transfused She has been weak and itching with no vomiting but mild loss of taste and appetitie  Past Medical History  Diagnosis Date  . Hypertension   . Diabetes mellitus   . Chronic kidney disease   . Thyroid disease   . Anemia   . Hyperlipidemia   . Arthritis     Past Surgical History  Procedure Laterality Date  . Arteriovenous graft placement Left     non function  . Eye surgery Bilateral     catarct  . Cesarean section    . Av fistula placement Right 01/30/2013    Procedure: ARTERIOVENOUS (AV) FISTULA CREATION;  Surgeon: Fransisco Hertz, MD;  Location: [facility] OR;  Service: Vascular;  Laterality: Right;    Prior to Admission medications   Medication Sig Start Date End Date Taking? Authorizing Provider  albuterol (PROVENTIL HFA;VENTOLIN HFA) 108 (90 BASE) MCG/ACT inhaler Inhale 1-2 puffs into the lungs every 6 (six) hours as needed for wheezing. 12/27/12  Yes April K Palumbo-Rasch, MD  ALPRAZolam Prudy Feeler) 0.25 MG tablet Take 1 tablet (0.25 mg total) by mouth 3 (three) times daily as needed for sleep or anxiety. 01/09/13  Yes Vida Roller, MD  amLODipine (NORVASC) 10 MG tablet Take 10 mg by mouth daily.     Yes Historical Provider, MD  aspirin 325 MG tablet Take 325 mg by mouth daily.   Yes Historical Provider, MD  Aspirin Effervescent (ALKA-SELTZER PO) Take 1 tablet by mouth as needed. For indigestion and heartburn   Yes Historical Provider, MD  calcitRIOL (ROCALTROL) 0.25 MCG capsule Take 0.25 mcg by mouth daily.   Yes Historical Provider, MD  Calcium Carbonate  Antacid (TUMS ULTRA PO) Take 1 tablet by mouth daily as needed. For acid reflux   Yes Historical Provider, MD  FOSRENOL 1000 MG chewable tablet Chew 1,000 mg by mouth 3 (three) times daily with meals.  01/18/13  Yes Historical Provider, MD  furosemide (LASIX) 80 MG tablet Take 80 mg by mouth 2 (two) times daily.     Yes Historical Provider, MD  insulin aspart (NOVOLOG) 100 UNIT/ML injection Inject 1-15 Units into the skin daily.     Yes Historical Provider, MD  insulin detemir (LEVEMIR) 100 UNIT/ML injection Inject 15 Units into the skin at bedtime.   Yes Historical Provider, MD  IRON PO Take 1 tablet by mouth daily.   Yes Historical Provider, MD  metoprolol (LOPRESSOR) 50 MG tablet Take 50 mg by mouth 2 (two) times daily.   Yes Historical Provider, MD  mometasone (NASONEX) 50 MCG/ACT nasal spray Place 2 sprays into the nose daily as needed. For nasal congestion   Yes Historical Provider, MD  oxyCODONE (ROXICODONE) 5 MG immediate release tablet Take 1 tablet (5 mg total) by mouth every 4 (four) hours as needed for pain. 01/30/13  Yes Regina J Roczniak, PA-C  pregabalin (LYRICA) 50 MG capsule Take 50 mg by mouth 3 (three) times daily.    Yes Historical Provider, MD    Current Facility-Administered Medications  Medication Dose Route Frequency Provider Last Rate  Last Dose  . acetaminophen (TYLENOL) tablet 650 mg  650 mg Oral Q6H PRN Maryann Mikhail, DO   650 mg at 04/19/13 0430   Or  . acetaminophen (TYLENOL) suppository 650 mg  650 mg Rectal Q6H PRN Maryann Mikhail, DO      . albuterol (PROVENTIL HFA;VENTOLIN HFA) 108 (90 BASE) MCG/ACT inhaler 1-2 puff  1-2 puff Inhalation Q6H PRN Maryann Mikhail, DO      . amLODipine (NORVASC) tablet 10 mg  10 mg Oral Daily Maryann Mikhail, DO      . aspirin tablet 325 mg  325 mg Oral Daily Maryann Mikhail, DO      . calcitRIOL (ROCALTROL) capsule 0.25 mcg  0.25 mcg Oral Daily Maryann Mikhail, DO      . fluticasone (FLONASE) 50 MCG/ACT nasal spray 1 spray  1 spray  Each Nare Daily Maryann Mikhail, DO      . furosemide (LASIX) tablet 80 mg  80 mg Oral BID Maryann Mikhail, DO   80 mg at 04/19/13 1610  . insulin aspart (novoLOG) injection 0-15 Units  0-15 Units Subcutaneous TID WC Maryann Mikhail, DO   2 Units at 04/18/13 1904  . insulin detemir (LEVEMIR) injection 15 Units  15 Units Subcutaneous QHS Maryann Mikhail, DO   15 Units at 04/18/13 2247  . metoprolol (LOPRESSOR) tablet 50 mg  50 mg Oral BID Maryann Mikhail, DO      . morphine 2 MG/ML injection 1 mg  1 mg Intravenous Q4H PRN Maryann Mikhail, DO      . ondansetron (ZOFRAN) tablet 4 mg  4 mg Oral Q6H PRN Maryann Mikhail, DO       Or  . ondansetron (ZOFRAN) injection 4 mg  4 mg Intravenous Q6H PRN Maryann Mikhail, DO      . pregabalin (LYRICA) capsule 50 mg  50 mg Oral TID Maryann Mikhail, DO   50 mg at 04/18/13 2249    Allergies as of 04/18/2013  . (No Known Allergies)    Family History  Problem Relation Age of Onset  . Diabetes Mother   . Heart disease Mother   . Hypertension Mother     History   Social History  . Marital Status: Widowed    Spouse Name: N/A    Number of Children: N/A  . Years of Education: N/A   Occupational History  . Not on file.   Social History Main Topics  . Smoking status: Never Smoker   . Smokeless tobacco: Never Used  . Alcohol Use: No  . Drug Use: No  . Sexual Activity: Not on file   Other Topics Concern  . Not on file   Social History Narrative  . No narrative on file    Review of Systems: Gen: + fatigue and Weak HEENT: No visual complaints, No history of Retinopathy. Normal external appearance No Epistaxis or Sore throat. No sinusitis.   CV: Denies chest pain, angina, palpitations, syncope, orthopnea, PND, peripheral edema, and claudication. Resp: Complains of dyspnea at rest no edema GI: Acute GI Bleed   Denies dysphagia or odynophagia. GU : Denies urinary burning, blood in urine, urinary frequency, urinary hesitancy, nocturnal urination,  and urinary incontinence.  No renal calculi. CKD 5 MS: Denies joint pain, limitation of movement, and swelling, stiffness, low back pain, extremity pain. Denies muscle weakness, cramps, atrophy.  No use of non steroidal antiinflammatory drugs. Derm: Denies rash, itching, dry skin, hives, moles, warts, or unhealing ulcers.  Psych: Denies depression, anxiety, memory loss, suicidal ideation,  hallucinations, paranoia, and confusion. Heme: Denies bruising,+ bleeding,  Neuro: No headache.  No diplopia. No dysarthria.  No dysphasia.  No history of CVA.  No Seizures. No paresthesias.  No weakness. Endocrine +DM.  No Thyroid disease.  No Adrenal disease.  Physical Exam: Vital signs in last 24 hours: Temp:  [98.1 F (36.7 C)-101.4 F (38.6 C)] 98.7 F (37.1 C) (10/01 0800) Pulse Rate:  [55-109] 55 (10/01 0800) Resp:  [11-25] 11 (10/01 0800) BP: (115-161)/(44-82) 141/55 mmHg (10/01 0800) SpO2:  [75 %-99 %] 94 % (10/01 0800) FiO2 (%):  [35 %-100 %] 50 % (10/01 0800) Weight:  [72.7 kg (160 lb 4.4 oz)] 72.7 kg (160 lb 4.4 oz) (10/01 0435) Last BM Date: 04/18/13 General:   Chronically ill appearing Head:  Normocephalic and atraumatic. Eyes:  Sclera clear, no icterus.   Conjunctiva pale Ears:  Normal auditory acuity. Nose:  No deformity, discharge,  or lesions. Mouth:  No deformity or lesions, dentition normal. Neck:  Supple; no masses or thyromegaly. JVP elevated mildly Lungs:  Clear throughout to auscultation.   Basal rales. Heart:  Regular rate and rhythm; no murmurs, clicks, rubs,  or gallops. Abdomen:  Soft, nontender and nondistended. No masses, hepatosplenomegaly or hernias noted. Normal bowel sounds, without guarding, and without rebound.   Msk:  Symmetrical without gross deformities. Normal posture. Pulses:  No carotid, renal, femoral bruits. DP and PT symmetrical and equal Extremities:  Right AVF  Neurologic:  Alert and  oriented x4;  grossly normal neurologically. Skin:  Intact without  significant lesions or rashes. Cervical Nodes:  No significant cervical adenopathy. Psych:  Alert and cooperative. Normal mood and affect.  Intake/Output from previous day: 09/30 0701 - 10/01 0700 In: 1420 [P.O.:720; Blood:700] Out: 300 [Urine:300] Intake/Output this shift:    Lab Results:  Recent Labs  04/18/13 1055 04/18/13 1338 04/19/13 0129  WBC 15.3*  --  11.8*  HGB 4.2* 5.9* 9.9*  HCT 12.2* 17.6* 28.8*  PLT 169  --  128*   BMET  Recent Labs  04/18/13 1055 04/19/13 0129  NA 137 139  K 4.4 4.9  CL 96 99  CO2 19 18*  GLUCOSE 145* 162*  BUN 110* 116*  CREATININE 7.65* 7.73*  CALCIUM 8.6 7.9*   LFT No results found for this basename: PROT, ALBUMIN, AST, ALT, ALKPHOS, BILITOT, BILIDIR, IBILI,  in the last 72 hours PT/INR No results found for this basename: LABPROT, INR,  in the last 72 hours Hepatitis Panel No results found for this basename: HEPBSAG, HCVAB, HEPAIGM, HEPBIGM,  in the last 72 hours  Studies/Results: Dg Chest 2 View  04/18/2013   CLINICAL DATA:  Short of breath and wheezing  EXAM: CHEST  2 VIEW  COMPARISON:  01/08/2013  FINDINGS: Cardiac enlargement. Diffuse bilateral airspace disease, most consistent with pulmonary edema. Small effusions and mild bibasilar atelectasis.  IMPRESSION: Congestive heart failure with diffuse pulmonary edema.   Electronically Signed   By: Marlan Palau M.D.   On: 04/18/2013 09:59   Dg Chest Port 1 View  04/19/2013   CLINICAL DATA:  Fever, CHF, short of breath  EXAM: PORTABLE CHEST - 1 VIEW  COMPARISON:  04/18/2013.  FINDINGS: Diffuse bilateral airspace disease remains present. Cardiomegaly with partially obscured pericardial silhouette by airspace disease. In the right upper lobe, the airspace disease appears more prominent. The differential considerations include multifocal pneumonia, pulmonary edema or a combination of these 2 entities. There is no pleural effusion or pneumothorax identified. Monitoring leads project over  the chest.  IMPRESSION: Slight worsening of airspace disease, likely representing asymmetric pulmonary edema and CHF.   Electronically Signed   By: Andreas Newport M.D.   On: 04/19/2013 07:05    Assessment/Plan:  CKD 5  Some uremic symptoms and volume overload Plan urgent dialysis  HTN controlled will monitor with ultrafiltration and fluid removal  Anemia Start ESA.and transfuse  Bones Start IV Hectoral Binders for hyperphosphatemia  AVF placed in Right arm mature    LOS: 1 Demetrios Byron W @TODAY @10 :24 AM

## 2013-04-19 NOTE — Procedures (Signed)
I have seen and examined this patient and agree with the plan of care . Seen on dilaysis  Jackie [name] W 04/19/2013, 11:26 AM

## 2013-04-19 NOTE — Progress Notes (Signed)
Patient has gone to dialysis in her bed.

## 2013-04-19 NOTE — Progress Notes (Signed)
Received a call from the dialysis nurse that they could not do her treatment because they were pulling a lot of clots out of the fistula so the patient is [facility] [place] [facility] access for dialysis. Have spoke with patient's sister and have updated her.

## 2013-04-20 ENCOUNTER — Inpatient Hospital Stay (HOSPITAL_COMMUNITY): Payer: Medicare Other

## 2013-04-20 ENCOUNTER — Encounter (HOSPITAL_COMMUNITY): Payer: Self-pay | Admitting: Radiology

## 2013-04-20 DIAGNOSIS — I369 Nonrheumatic tricuspid valve disorder, unspecified: Secondary | ICD-10-CM

## 2013-04-20 DIAGNOSIS — N185 Chronic kidney disease, stage 5: Secondary | ICD-10-CM | POA: Diagnosis not present

## 2013-04-20 DIAGNOSIS — E119 Type 2 diabetes mellitus without complications: Secondary | ICD-10-CM | POA: Diagnosis not present

## 2013-04-20 DIAGNOSIS — D649 Anemia, unspecified: Secondary | ICD-10-CM | POA: Diagnosis not present

## 2013-04-20 DIAGNOSIS — K8070 Calculus of gallbladder and bile duct without cholecystitis without obstruction: Secondary | ICD-10-CM | POA: Diagnosis not present

## 2013-04-20 DIAGNOSIS — I10 Essential (primary) hypertension: Secondary | ICD-10-CM | POA: Diagnosis not present

## 2013-04-20 LAB — GLUCOSE, CAPILLARY
Glucose-Capillary: 170 mg/dL — ABNORMAL HIGH (ref 70–99)
Glucose-Capillary: 93 mg/dL (ref 70–99)
Glucose-Capillary: 76 mg/dL (ref 70–99)

## 2013-04-20 LAB — RENAL FUNCTION PANEL
Albumin: 2.6 g/dL — ABNORMAL LOW (ref 3.5–5.2)
CO2: 21 mEq/L (ref 19–32)
Calcium: 8.2 mg/dL — ABNORMAL LOW (ref 8.4–10.5)
Creatinine, Ser: 6.44 mg/dL — ABNORMAL HIGH (ref 0.50–1.10)
Glucose, Bld: 106 mg/dL — ABNORMAL HIGH (ref 70–99)
Phosphorus: 6.6 mg/dL — ABNORMAL HIGH (ref 2.3–4.6)
Potassium: 4.1 mEq/L (ref 3.5–5.1)
Sodium: 140 mEq/L (ref 135–145)

## 2013-04-20 LAB — CBC
HCT: 29.1 % — ABNORMAL LOW (ref 36.0–46.0)
Hemoglobin: 9.9 g/dL — ABNORMAL LOW (ref 12.0–15.0)
MCH: 29.3 pg (ref 26.0–34.0)
MCHC: 34 g/dL (ref 30.0–36.0)
MCV: 86.1 fL (ref 78.0–100.0)
RDW: 16.2 % — ABNORMAL HIGH (ref 11.5–15.5)
WBC: 13.5 10*3/uL — ABNORMAL HIGH (ref 4.0–10.5)

## 2013-04-20 MED ORDER — WHITE PETROLATUM GEL
Status: AC
  Filled 2013-04-20: qty 5

## 2013-04-20 NOTE — Progress Notes (Signed)
To dialysis, no complaints.

## 2013-04-20 NOTE — Procedures (Signed)
I have seen and examined this patient and agree with the plan of care. Patient seen in dialysis. Removed 2.5 L  BP 130/80. Diminished air entry at bases using non rebreather. Jackie [name] W 04/20/2013, 9:51 AM

## 2013-04-20 NOTE — Progress Notes (Signed)
  Echocardiogram 2D Echocardiogram has been performed.  Jackie [name] 04/20/2013, 3:18 PM

## 2013-04-20 NOTE — Progress Notes (Signed)
Patient ID: Jackie [name], female   DOB: 1935-01-13, 78 y.o.   MRN: [id number] [facility] PROGRESS NOTE  Jackie [name] NWG:[id number] DOB: Mar 14, 1935 DOA: 04/18/2013 PCP: Jackie Courier, MD  Assessment/Plan: 1. Anemia: hgb 9.8 on 8/4 and 4.2 on 9/30>> 5 g drop in two months.  She has ESRD with baseline anemia, but she has had an acute blood loss.  She reports chronic mild abdominal pain, denies melana, hematochezia, vomiting.  She has never had a colonoscopy or EGD.  She was taking ASA 325 mg daily at home. GI following, she will need   EGd and colonoscopy. 2. Syncope: Likely due to anemia. 3. Hypoxia: Resolved, She reported cough and shortness of breath prior to admission.  CXR shows pulmonary edema 9/30 and worsening edema this morning after transfusion.  BNP was >2000 on admission.  ECHO done the result is pending  She is doing well on nasal canula for now.  4. HTN: controled. No changes 5. DM: blood sugars controled. No changes 6.    ESRD: Underwent hemodialysis, nephrology following.  Code Status: Full Family Communication: spoke with patient at bedside Disposition Plan: remain in SDU. Continue to wean off facemask to nasal canula. Check cbc this pm.   Consultants:  Gastroenterology Jackie [name]  Nephrology  Procedures:  Transfusion 4 U prbc's 9/30  Antibiotics:  none  HPI/Subjective: Jackie [name] is a 78 year old woman with ESRD, HTN, DM, chronic anemia with a baseline hgb in the 9 range who presented on 9/30 after a syncopal event and was found to have a hgb of 4.2.  Patient is s/p Hemodialysis, no new complaints today.  Objective: Filed Vitals:   04/20/13 1524  BP:   Pulse:   Temp: 99.7 F (37.6 C)  Resp:     Intake/Output Summary (Last 24 hours) at 04/20/13 1735 Last data filed at 04/20/13 1624  Gross per 24 hour  Intake    240 ml  Output   2547 ml  Net  -2307 ml   Filed Weights   04/20/13 0600 04/20/13 0725 04/20/13 1010  Weight: 69.2 kg (152 lb  8.9 oz) 69.3 kg (152 lb 12.5 oz) 66.8 kg (147 lb 4.3 oz)    Exam:   General:  Appear in no acute distress  Cardiovascular: S1s2 RRR  Respiratory: Clear bilaterally  Abdomen: Soft, nontender, no organomegaly  Data Reviewed: Basic Metabolic Panel:  Recent Labs Lab 04/18/13 1055 04/19/13 0129 04/19/13 0925 04/20/13 0510  NA 137 139 140 140  K 4.4 4.9 4.7 4.1  CL 96 99 99 98  CO2 19 18* 20 21  GLUCOSE 145* 162* 94 106*  BUN 110* 116* 120* 86*  CREATININE 7.65* 7.73* 8.27* 6.44*  CALCIUM 8.6 7.9* 8.1* 8.2*  PHOS  --   --   --  6.6*   Liver Function Tests:  Recent Labs Lab 04/20/13 0510  ALBUMIN 2.6*   No results found for this basename: LIPASE, AMYLASE,  in the last 168 hours No results found for this basename: AMMONIA,  in the last 168 hours CBC:  Recent Labs Lab 04/18/13 1055 04/18/13 1338 04/19/13 0129 04/19/13 0925 04/19/13 1600 04/20/13 0500  WBC 15.3*  --  11.8* 11.2* 13.8* 13.5*  NEUTROABS 12.6*  --   --   --   --   --   HGB 4.2* 5.9* 9.9* 9.6* 9.8* 9.9*  HCT 12.2* 17.6* 28.8* 28.0* 28.7* 29.1*  MCV 85.9  --  85.5 84.6 84.7 86.1  PLT 169  --  128* 127* 154 141*   Cardiac Enzymes: No results found for this basename: CKTOTAL, CKMB, CKMBINDEX, TROPONINI,  in the last 168 hours BNP (last 3 results)  Recent Labs  01/08/13 1042 04/18/13 1055  PROBNP 2385.0* 34669.0*   CBG:  Recent Labs Lab 04/19/13 1742 04/19/13 2210 04/20/13 0421 04/20/13 1107 04/20/13 1648  GLUCAP 85 170* 93 76 94    Recent Results (from the past 240 hour(s))  CULTURE, BLOOD (ROUTINE X 2)     Status: None   Collection Time    04/18/13  2:18 PM      Result Value Range Status   Specimen Description BLOOD   Final   Special Requests BOTTLES DRAWN AEROBIC AND ANAEROBIC 10CC LEFT EJ   Final   Culture  Setup Time     Final   Value: 04/18/2013 20:50     Performed at [facility]   Culture     Final   Value:        BLOOD CULTURE RECEIVED NO GROWTH TO DATE  CULTURE WILL BE HELD FOR 5 DAYS BEFORE ISSUING A FINAL NEGATIVE REPORT     Performed at [facility]   Report Status PENDING   Incomplete  CULTURE, BLOOD (ROUTINE X 2)     Status: None   Collection Time    04/18/13  2:25 PM      Result Value Range Status   Specimen Description BLOOD HAND LEFT   Final   Special Requests BOTTLES DRAWN AEROBIC ONLY 6CC   Final   Culture  Setup Time     Final   Value: 04/18/2013 20:50     Performed at [facility]   Culture     Final   Value:        BLOOD CULTURE RECEIVED NO GROWTH TO DATE CULTURE WILL BE HELD FOR 5 DAYS BEFORE ISSUING A FINAL NEGATIVE REPORT     Performed at [facility]   Report Status PENDING   Incomplete  MRSA PCR SCREENING     Status: None   Collection Time    04/18/13  7:59 PM      Result Value Range Status   MRSA by PCR NEGATIVE  NEGATIVE Final   Comment:            The GeneXpert MRSA Assay (FDA     approved for NASAL specimens     only), is one component of a     comprehensive MRSA colonization     surveillance program. It is not     intended to diagnose MRSA     infection nor to guide or     monitor treatment for     MRSA infections.  CULTURE, BLOOD (ROUTINE X 2)     Status: None   Collection Time    04/19/13  3:45 PM      Result Value Range Status   Specimen Description BLOOD HEMODIALYSIS CATHETER   Final   Special Requests     Final   Value: BOTTLES DRAWN AEROBIC AND ANAEROBIC 10CC PT ON AZITHROMYCIN   Culture  Setup Time     Final   Value: 04/19/2013 21:29     Performed at [facility]   Culture     Final   Value:        BLOOD CULTURE RECEIVED NO GROWTH TO DATE CULTURE WILL BE HELD FOR 5 DAYS BEFORE ISSUING A FINAL NEGATIVE REPORT     Performed at [facility]   Report Status PENDING   Incomplete     Studies: Ct Abdomen Pelvis Wo Contrast  04/20/2013   CLINICAL DATA:  Retroperitoneal bleed.  EXAM: CT ABDOMEN AND PELVIS WITHOUT CONTRAST  TECHNIQUE: Multidetector CT  imaging of the abdomen and pelvis was performed following the standard protocol without intravenous contrast.  COMPARISON:  None.  FINDINGS: The lung bases demonstrate bilateral infiltrates and a small right effusion. The heart is enlarged. No pericardial effusion.  The unenhanced appearance of the liver is grossly normal. Multiple gallstones are noted in the gallbladder. Marked common bile duct dilatation measuring up to 14 mm with multiple common bile duct stones. No intrahepatic biliary dilatation. No obvious pancreatic head mass. The pancreatic body and tail were atrophy. The spleen is normal in size. The adrenal glands and kidneys are unremarkable. Extensive renal artery calcifications are noted.  The stomach, duodenum, small bowel and colon are grossly normal without oral contrast. No inflammatory changes or mass lesions. There are scattered air-fluid levels in the small bowel but no significant distention. No mesenteric or retroperitoneal mass or adenopathy. Advanced aortic and branch vessel calcifications are noted but no focal aneurysm.  Dense calcification of the uterus. The ovaries are normal. No pelvic mass, adenopathy or free pelvic fluid collections. There is mild diffuse bladder wall thickening and there is a moderate amount of air in the bladder likely related to previous instrumentation/catheterization.  The bony structures are unremarkable.  IMPRESSION: 1. Bibasilar infiltrates. 2. Cholelithiasis and choledocholithiasis. Dilated common bile duct but no intrahepatic ductal dilatation. 3. Extensive vascular calcifications. 4. Gallbladder wall thickening and moderate air in the bladder likely from recent instrumentation/catheterization. 5. No retroperitoneal hematoma.   Electronically Signed   By: Loralie Champagne M.D.   On: 04/20/2013 01:20   Ir Fluoro Guide Cv Line Right  04/19/2013   *RADIOLOGY REPORT*  NON TUNNELED HD CATHETER PLACEMENT WITH ULTRASOUND AND FLUOROSCOPIC GUIDANCE  Clinical  History: 78 year-old female with end-stage renal disease on hemodialysis.  She has a relatively new arteriovenous fistula which was access for first-time today and found to be poorly functioning.  She requires urgent hemodialysis.  She currently has a mild leukocytosis and is febrile.  Therefore, a temporary hemodialysis catheter will be placed.  After her urgent hemodialysis, we can consider fistulogram and / or conversion to a tunneled hemodialysis catheter  Fluoroscopy Time: 1 minute 24 seconds  Procedure:  The right neck was prepped with chlorhexidine, draped in the usual sterile fashion using maximum barrier technique (cap and mask, sterile gown, sterile gloves, large sterile sheet, hand hygiene and cutaneous antiseptic).  Local anesthesia was attained by infiltration with 1% lidocaine.  Ultrasound demonstrated patency of the right internal jugular vein, and this was documented with an image.  Under real-time ultrasound guidance, this vein was accessed with a 21 gauge micropuncture needle and image documentation was performed. The wire was advanced through the right heart and into the inferior vena cava.  The skin tract was then dilated and ultimately a 20 cm Bard Trialysis catheter was advanced over the wire and positioned with the tip in the mid right atrium.  Fluoroscopy during the procedure and fluoro spot radiograph confirms appropriate catheter position.  The catheter was flushed, secured to the skin with Prolene sutures, and covered with a sterile dressing.  Complications:  None.  The patient tolerated the procedure well.  IMPRESSION:  Successful placement of a right IJ approach Trialysis catheter with sonographic and fluoroscopic guidance.  The catheter is  ready for use.  Signed,  Sterling Big, MD [facility]   Original Report Authenticated By: Malachy Moan, M.D.   Ir US Guide Vasc Access Right  04/19/2013   *RADIOLOGY REPORT*  NON  TUNNELED HD CATHETER PLACEMENT WITH ULTRASOUND AND FLUOROSCOPIC GUIDANCE  Clinical History: 78 year-old female with end-stage renal disease on hemodialysis.  She has a relatively new arteriovenous fistula which was access for first-time today and found to be poorly functioning.  She requires urgent hemodialysis.  She currently has a mild leukocytosis and is febrile.  Therefore, a temporary hemodialysis catheter will be placed.  After her urgent hemodialysis, we can consider fistulogram and / or conversion to a tunneled hemodialysis catheter  Fluoroscopy Time: 1 minute 24 seconds  Procedure:  The right neck was prepped with chlorhexidine, draped in the usual sterile fashion using maximum barrier technique (cap and mask, sterile gown, sterile gloves, large sterile sheet, hand hygiene and cutaneous antiseptic).  Local anesthesia was attained by infiltration with 1% lidocaine.  Ultrasound demonstrated patency of the right internal jugular vein, and this was documented with an image.  Under real-time ultrasound guidance, this vein was accessed with a 21 gauge micropuncture needle and image documentation was performed. The wire was advanced through the right heart and into the inferior vena cava.  The skin tract was then dilated and ultimately a 20 cm Bard Trialysis catheter was advanced over the wire and positioned with the tip in the mid right atrium.  Fluoroscopy during the procedure and fluoro spot radiograph confirms appropriate catheter position.  The catheter was flushed, secured to the skin with Prolene sutures, and covered with a sterile dressing.  Complications:  None.  The patient tolerated the procedure well.  IMPRESSION:  Successful placement of a right IJ approach Trialysis catheter with sonographic and fluoroscopic guidance.  The catheter is ready for use.  Signed,  Sterling Big, MD [facility]   Original Report Authenticated By: Malachy Moan, M.D.   Dg Chest Port 1 View  04/19/2013   CLINICAL DATA:  Fever, CHF, short of breath  EXAM: PORTABLE CHEST - 1 VIEW  COMPARISON:  04/18/2013.  FINDINGS: Diffuse bilateral airspace disease remains present. Cardiomegaly with partially obscured pericardial silhouette by airspace disease. In the right upper lobe, the airspace disease appears more prominent. The differential considerations include multifocal pneumonia, pulmonary edema or a combination of these 2 entities. There is no pleural effusion or pneumothorax identified. Monitoring leads project over the chest.  IMPRESSION: Slight worsening of airspace disease, likely representing asymmetric pulmonary edema and CHF.   Electronically Signed   By: Andreas Newport M.D.   On: 04/19/2013 07:05    Scheduled Meds: . amLODipine  10 mg Oral Daily  . aspirin  325 mg Oral Daily  . azithromycin  500 mg Oral Daily  . doxercalciferol  4 mcg Intravenous Q M,W,F-HD  . fluticasone  1 spray Each Nare Daily  . insulin aspart  0-15 Units Subcutaneous TID WC  . insulin detemir  15 Units Subcutaneous QHS  . metoprolol  50 mg Oral BID  . pregabalin  50 mg Oral Daily   Continuous Infusions:   Principal Problem:   Anemia Active Problems:   End stage renal disease   HTN (hypertension), benign   Diabetes mellitus   Syncope    Time spent: 45 minutes   [facility]  [facility] Pager [phone]. If 7PM-7AM, please contact night-coverage at  [email or web address], password [facility] 04/20/2013, 5:35 PM  LOS: 2 days

## 2013-04-21 DIAGNOSIS — N186 End stage renal disease: Secondary | ICD-10-CM

## 2013-04-21 DIAGNOSIS — D509 Iron deficiency anemia, unspecified: Secondary | ICD-10-CM | POA: Diagnosis not present

## 2013-04-21 DIAGNOSIS — N185 Chronic kidney disease, stage 5: Secondary | ICD-10-CM | POA: Diagnosis not present

## 2013-04-21 DIAGNOSIS — I10 Essential (primary) hypertension: Secondary | ICD-10-CM | POA: Diagnosis not present

## 2013-04-21 DIAGNOSIS — T82898A Other specified complication of vascular prosthetic devices, implants and grafts, initial encounter: Secondary | ICD-10-CM

## 2013-04-21 DIAGNOSIS — E119 Type 2 diabetes mellitus without complications: Secondary | ICD-10-CM | POA: Diagnosis not present

## 2013-04-21 DIAGNOSIS — D649 Anemia, unspecified: Secondary | ICD-10-CM | POA: Diagnosis not present

## 2013-04-21 LAB — CBC
HCT: 28.7 % — ABNORMAL LOW (ref 36.0–46.0)
Hemoglobin: 9.8 g/dL — ABNORMAL LOW (ref 12.0–15.0)
RBC: 3.32 MIL/uL — ABNORMAL LOW (ref 3.87–5.11)
WBC: 12.4 10*3/uL — ABNORMAL HIGH (ref 4.0–10.5)

## 2013-04-21 LAB — RENAL FUNCTION PANEL
Albumin: 2.3 g/dL — ABNORMAL LOW (ref 3.5–5.2)
BUN: 65 mg/dL — ABNORMAL HIGH (ref 6–23)
CO2: 23 mEq/L (ref 19–32)
Calcium: 8.2 mg/dL — ABNORMAL LOW (ref 8.4–10.5)
Creatinine, Ser: 5.97 mg/dL — ABNORMAL HIGH (ref 0.50–1.10)
GFR calc Af Amer: 7 mL/min — ABNORMAL LOW (ref >90)
GFR calc non Af Amer: 6 mL/min — ABNORMAL LOW (ref >90)
Glucose, Bld: 92 mg/dL (ref 70–99)
Phosphorus: 5.5 mg/dL — ABNORMAL HIGH (ref 2.3–4.6)
Sodium: 138 mEq/L (ref 135–145)

## 2013-04-21 LAB — GLUCOSE, CAPILLARY
Glucose-Capillary: 203 mg/dL — ABNORMAL HIGH (ref 70–99)
Glucose-Capillary: 86 mg/dL (ref 70–99)
Glucose-Capillary: 74 mg/dL (ref 70–99)
Glucose-Capillary: 91 mg/dL (ref 70–99)

## 2013-04-21 MED ORDER — LIDOCAINE HCL (PF) 1 % IJ SOLN: 5.0000 mL | INTRAMUSCULAR | Status: DC | PRN

## 2013-04-21 MED ORDER — PENTAFLUOROPROP-TETRAFLUOROETH EX AERO: 1.0000 "application " | INHALATION_SPRAY | CUTANEOUS | Status: DC | PRN

## 2013-04-21 MED ORDER — DOXERCALCIFEROL 4 MCG/2ML IV SOLN
INTRAVENOUS | Status: AC
  Filled 2013-04-21: qty 2

## 2013-04-21 MED ORDER — HEPARIN SODIUM (PORCINE) 1000 UNIT/ML DIALYSIS: 1000.0000 [IU] | INTRAMUSCULAR | Status: DC | PRN

## 2013-04-21 MED ORDER — NEPRO/CARBSTEADY PO LIQD
237.0000 mL | ORAL | Status: DC | PRN
  Filled 2013-04-21: qty 237

## 2013-04-21 MED ORDER — LIDOCAINE-PRILOCAINE 2.5-2.5 % EX CREA: 1.0000 "application " | TOPICAL_CREAM | CUTANEOUS | Status: DC | PRN

## 2013-04-21 MED ORDER — SODIUM CHLORIDE 0.9 % IV SOLN: 100.0000 mL | INTRAVENOUS | Status: DC | PRN

## 2013-04-21 MED ORDER — ALTEPLASE 2 MG IJ SOLR
2.0000 mg | Freq: Once | INTRAMUSCULAR | Status: DC | PRN
  Filled 2013-04-21: qty 2

## 2013-04-21 MED ORDER — MORPHINE SULFATE 2 MG/ML IJ SOLN
INTRAMUSCULAR | Status: AC
  Filled 2013-04-21: qty 1

## 2013-04-21 NOTE — Procedures (Signed)
I have seen and examined this patient and agree with the plan of care . Patient seen on dialysis BP 80/40 with ultrafiltration. Will stop antihypertensives. [facility]VS consulted for access. Using temporary dialysis catheter for now due to fever.  Veralyn Lopp W 04/21/2013, 9:44 AM

## 2013-04-21 NOTE — Progress Notes (Signed)
Utilization review completed.  

## 2013-04-21 NOTE — Progress Notes (Signed)
Hemodialysis- Pt has been placed at [facility] for outpatient dialysis on a TTS schedule. Chair time 12noon but will need to arrive at 1100 for first HD tx to go over paperwork. Currently discussing transportation with family. Please notify Hemodialysis department of impending discharge. Thanks.

## 2013-04-21 NOTE — Progress Notes (Signed)
Patient ID: Jackie [name], female   DOB: 12-07-1934, 78 y.o.   MRN: [id number] [facility] Gastroenterology Progress Note  Jackie [name] 78 y.o. Nov 12, 1934   Subjective: On hemodialysis. Very sleepy on dialysis.  Objective: Vital signs: Filed Vitals:   04/21/13 0900  BP: 95/52  Pulse: 70  Temp: 99.3  Resp: 19    Physical Exam: Gen: lethargic, no acute distress    Lab Results:  Recent Labs  04/20/13 0510 04/21/13 0520  NA 140 138  K 4.1 4.0  CL 98 98  CO2 21 23  GLUCOSE 106* 92  BUN 86* 65*  CREATININE 6.44* 5.97*  CALCIUM 8.2* 8.2*  PHOS 6.6* 5.5*    Recent Labs  04/20/13 0510 04/21/13 0520  ALBUMIN 2.6* 2.3*    Recent Labs  04/18/13 1055  04/20/13 0500 04/21/13 0622  WBC 15.3*  < > 13.5* 12.4*  NEUTROABS 12.6*  --   --   --   HGB 4.2*  < > 9.9* 9.8*  HCT 12.2*  < > 29.1* 28.7*  MCV 85.9  < > 86.1 86.4  PLT 169  < > 141* 144*  < > = values in this interval not displayed.    Assessment/Plan: 78 yo with renal failure on dialysis and severe anemia (Heme negative). Would favor holding off on EGD/colon until early next week. I am not convinced she can drink a colon prep right now but would prefer it be done prior to discharge if possible. Will reassess this weekend to determine timing of GI procedures for next week.   Jackie [name] C. 04/21/2013, 9:03 AM

## 2013-04-21 NOTE — Consult Note (Signed)
[facility]   CC:  Unable to cannulate fistula  HPI:  This is a 78 y.o. female who is s/p a right brachiocephalic AVF by Dr. Imogene Burn on 01/30/13.  He saw her back in the office 4 weeks later and she denied any steal symptoms and able to complete her ADL's.  The sonosite revealed no stenosis and no frozen valves.  The smallest diameter was 5.41mm.  We are consulted today by Dr. Hyman Hopes as the pt has started on HD and the fistula was unable to be used.  She is dialyzing through a temporary catheter at this time.  No Known Allergies  Current Facility-Administered Medications  Medication Dose Route Frequency Provider Last Rate Last Dose  . 0.9 %  sodium chloride infusion  100 mL Intravenous PRN Garnetta Buddy, MD      . 0.9 %  sodium chloride infusion  100 mL Intravenous PRN Garnetta Buddy, MD      . acetaminophen (TYLENOL) tablet 650 mg  650 mg Oral Q6H PRN Maryann Mikhail, DO   650 mg at 04/20/13 1106   Or  . acetaminophen (TYLENOL) suppository 650 mg  650 mg Rectal Q6H PRN Maryann Mikhail, DO      . albuterol (PROVENTIL HFA;VENTOLIN HFA) 108 (90 BASE) MCG/ACT inhaler 1-2 puff  1-2 puff Inhalation Q6H PRN Maryann Mikhail, DO      . alteplase (CATHFLO ACTIVASE) injection 2 mg  2 mg Intracatheter Once PRN Garnetta Buddy, MD      . aspirin tablet 325 mg  325 mg Oral Daily Maryann Mikhail, DO   325 mg at 04/20/13 1057  . azithromycin (ZITHROMAX) tablet 500 mg  500 mg Oral Daily Elby Showers, MD   500 mg at 04/20/13 1057  . doxercalciferol (HECTOROL) injection 4 mcg  4 mcg Intravenous Q M,W,F-HD Garnetta Buddy, MD   4 mcg at 04/21/13 0900  . feeding supplement (NEPRO CARB STEADY) liquid 237 mL  237 mL Oral PRN Garnetta Buddy, MD      . fluticasone [facility]) 50 MCG/ACT nasal spray 1 spray  1 spray Each Nare Daily Maryann Mikhail, DO   1 spray at 04/20/13 1058  . heparin injection 1,000 Units  1,000 Units Dialysis PRN Garnetta Buddy, MD      . insulin aspart (novoLOG) injection 0-15 Units  0-15  Units Subcutaneous TID WC Maryann Mikhail, DO   2 Units at 04/18/13 1904  . insulin detemir (LEVEMIR) injection 15 Units  15 Units Subcutaneous QHS Maryann Mikhail, DO   15 Units at 04/20/13 2201  . lidocaine (PF) (XYLOCAINE) 1 % injection 5 mL  5 mL Intradermal PRN Garnetta Buddy, MD      . lidocaine-prilocaine (EMLA) cream 1 application  1 application Topical PRN Garnetta Buddy, MD      . morphine 2 MG/ML injection 1 mg  1 mg Intravenous Q4H PRN Maryann Mikhail, DO   1 mg at 04/21/13 0947  . ondansetron (ZOFRAN) tablet 4 mg  4 mg Oral Q6H PRN Maryann Mikhail, DO       Or  . ondansetron (ZOFRAN) injection 4 mg  4 mg Intravenous Q6H PRN Maryann Mikhail, DO      . pentafluoroprop-tetrafluoroeth (GEBAUERS) aerosol 1 application  1 application Topical PRN Garnetta Buddy, MD      . pregabalin (LYRICA) capsule 50 mg  50 mg Oral Daily Garnetta Buddy, MD   50 mg at 04/20/13 1058  ROS:  See HPI  Physical Exam:  Filed Vitals:   04/21/13 1018  BP: 117/63  Pulse: 67  Temp:   Resp:     Incision:  healed Extremities:  + palpable radial pulse on the right.  There is a good thrill throughout the fistula, but feels deep.   A/P:  Pt seen and examined together with Dr. Arbie Cookey.   This is a 78 y.o. female who is admitted after passing out and having a hgb of 4.2 and was transfused.  She was started on urgent dialysis this admission. Her fistula in August was maturing nicely when evaluated.   It was unable to be cannulated and a temporary catheter was placed.  Given that it was of adequate size in August, we will get a duplex to evaluate the fistula depth/diameter and possible competing branches today.  Will schedule her for a diatek catheter for Monday 04/24/13 and depending on the duplex, may undergo superficialization of fistula at that time.  Doreatha Massed, PA-C [facility] [phone]

## 2013-04-21 NOTE — Progress Notes (Signed)
Patient ID: Jackie [name], female   DOB: 1934-11-18, 78 y.o.   MRN: [id number] [facility] PROGRESS NOTE  Jackie [name] WUJ:[id number] DOB: 1934/08/05 DOA: 04/18/2013 PCP: Evlyn Courier, MD  Assessment/Plan: 1. Anemia: hgb 9.8 on 8/4 and 4.2 on 9/30>> 5 g drop in two months.  She has ESRD with baseline anemia, but she has had an acute blood loss.  She reports chronic mild abdominal pain, denies melana, hematochezia, vomiting.  She has never had a colonoscopy or EGD.  She was taking ASA 325 mg daily at home. GI following, she will need   EGd and colonoscopy next week. 2. Syncope: Likely due to anemia. 3. Hypoxia: Resolved, She reported cough and shortness of breath prior to admission.  CXR shows pulmonary edema 9/30 and worsening edema this morning after transfusion.  BNP was >2000 on admission.  ECHO done the result is pending  She is doing well on nasal canula for now.  4. HTN: controled. No changes 5. DM: blood sugars controled. No changes 6.    ESRD: Underwent hemodialysis, nephrology following.  Code Status: Full Family Communication: spoke with patient at bedside Disposition Plan: remain in SDU. Continue to wean off facemask to nasal canula.    Consultants:  Gastroenterology Vivien Rossetti  Nephrology  Procedures:  Transfusion 4 U prbc's 9/30  Antibiotics:  none  HPI/Subjective: Jackie [name] is a 78 year old woman with ESRD, HTN, DM, chronic anemia with a baseline hgb in the 9 range who presented on 9/30 after a syncopal event and was found to have a hgb of 4.2.  Patient received blood transfusion. GI is following. Today she has no complaints.  Objective: Filed Vitals:   04/21/13 1117  BP:   Pulse:   Temp: 98.5 F (36.9 C)  Resp:     Intake/Output Summary (Last 24 hours) at 04/21/13 1544 Last data filed at 04/21/13 1100  Gross per 24 hour  Intake    240 ml  Output   2300 ml  Net  -2060 ml   Filed Weights   04/20/13 1010 04/21/13 0755 04/21/13 1100   Weight: 66.8 kg (147 lb 4.3 oz) 66.8 kg (147 lb 4.3 oz) 65.1 kg (143 lb 8.3 oz)    Exam:   General:  Appear in no acute distress  Cardiovascular: S1s2 RRR  Respiratory: Clear bilaterally  Abdomen: Soft, nontender, no organomegaly  Data Reviewed: Basic Metabolic Panel:  Recent Labs Lab 04/18/13 1055 04/19/13 0129 04/19/13 0925 04/20/13 0510 04/21/13 0520  NA 137 139 140 140 138  K 4.4 4.9 4.7 4.1 4.0  CL 96 99 99 98 98  CO2 19 18* 20 21 23   GLUCOSE 145* 162* 94 106* 92  BUN 110* 116* 120* 86* 65*  CREATININE 7.65* 7.73* 8.27* 6.44* 5.97*  CALCIUM 8.6 7.9* 8.1* 8.2* 8.2*  PHOS  --   --   --  6.6* 5.5*   Liver Function Tests:  Recent Labs Lab 04/20/13 0510 04/21/13 0520  ALBUMIN 2.6* 2.3*   No results found for this basename: LIPASE, AMYLASE,  in the last 168 hours No results found for this basename: AMMONIA,  in the last 168 hours CBC:  Recent Labs Lab 04/18/13 1055  04/19/13 0129 04/19/13 0925 04/19/13 1600 04/20/13 0500 04/21/13 0622  WBC 15.3*  --  11.8* 11.2* 13.8* 13.5* 12.4*  NEUTROABS 12.6*  --   --   --   --   --   --   HGB 4.2*  < > 9.9*  9.6* 9.8* 9.9* 9.8*  HCT 12.2*  < > 28.8* 28.0* 28.7* 29.1* 28.7*  MCV 85.9  --  85.5 84.6 84.7 86.1 86.4  PLT 169  --  128* 127* 154 141* 144*  < > = values in this interval not displayed. Cardiac Enzymes: No results found for this basename: CKTOTAL, CKMB, CKMBINDEX, TROPONINI,  in the last 168 hours BNP (last 3 results)  Recent Labs  01/08/13 1042 04/18/13 1055  PROBNP 2385.0* 34669.0*   CBG:  Recent Labs Lab 04/20/13 1107 04/20/13 1648 04/20/13 2157 04/21/13 0710 04/21/13 1159  GLUCAP 76 94 203* 86 74    Recent Results (from the past 240 hour(s))  CULTURE, BLOOD (ROUTINE X 2)     Status: None   Collection Time    04/18/13  2:18 PM      Result Value Range Status   Specimen Description BLOOD   Final   Special Requests BOTTLES DRAWN AEROBIC AND ANAEROBIC 10CC LEFT EJ   Final   Culture   Setup Time     Final   Value: 04/18/2013 20:50     Performed at [facility]   Culture     Final   Value:        BLOOD CULTURE RECEIVED NO GROWTH TO DATE CULTURE WILL BE HELD FOR 5 DAYS BEFORE ISSUING A FINAL NEGATIVE REPORT     Performed at [facility]   Report Status PENDING   Incomplete  CULTURE, BLOOD (ROUTINE X 2)     Status: None   Collection Time    04/18/13  2:25 PM      Result Value Range Status   Specimen Description BLOOD HAND LEFT   Final   Special Requests BOTTLES DRAWN AEROBIC ONLY 6CC   Final   Culture  Setup Time     Final   Value: 04/18/2013 20:50     Performed at [facility]   Culture     Final   Value:        BLOOD CULTURE RECEIVED NO GROWTH TO DATE CULTURE WILL BE HELD FOR 5 DAYS BEFORE ISSUING A FINAL NEGATIVE REPORT     Performed at [facility]   Report Status PENDING   Incomplete  MRSA PCR SCREENING     Status: None   Collection Time    04/18/13  7:59 PM      Result Value Range Status   MRSA by PCR NEGATIVE  NEGATIVE Final   Comment:            The GeneXpert MRSA Assay (FDA     approved for NASAL specimens     only), is one component of a     comprehensive MRSA colonization     surveillance program. It is not     intended to diagnose MRSA     infection nor to guide or     monitor treatment for     MRSA infections.  CULTURE, BLOOD (ROUTINE X 2)     Status: None   Collection Time    04/19/13  3:25 PM      Result Value Range Status   Specimen Description BLOOD HEMODIALYSIS CATHETER   Final   Special Requests BOTTLES DRAWN AEROBIC AND ANAEROBIC 10CC   Final   Culture  Setup Time     Final   Value: 04/20/2013 00:17     Performed at [facility]   Culture     Final   Value:  BLOOD CULTURE RECEIVED NO GROWTH TO DATE CULTURE WILL BE HELD FOR 5 DAYS BEFORE ISSUING A FINAL NEGATIVE REPORT     Performed at [facility]   Report Status PENDING   Incomplete  CULTURE, BLOOD (ROUTINE X 2)     Status: None    Collection Time    04/19/13  3:45 PM      Result Value Range Status   Specimen Description BLOOD HEMODIALYSIS CATHETER   Final   Special Requests     Final   Value: BOTTLES DRAWN AEROBIC AND ANAEROBIC 10CC PT ON AZITHROMYCIN   Culture  Setup Time     Final   Value: 04/19/2013 21:29     Performed at [facility]   Culture     Final   Value:        BLOOD CULTURE RECEIVED NO GROWTH TO DATE CULTURE WILL BE HELD FOR 5 DAYS BEFORE ISSUING A FINAL NEGATIVE REPORT     Performed at [facility]   Report Status PENDING   Incomplete     Studies: Ct Abdomen Pelvis Wo Contrast  04/20/2013   CLINICAL DATA:  Retroperitoneal bleed.  EXAM: CT ABDOMEN AND PELVIS WITHOUT CONTRAST  TECHNIQUE: Multidetector CT imaging of the abdomen and pelvis was performed following the standard protocol without intravenous contrast.  COMPARISON:  None.  FINDINGS: The lung bases demonstrate bilateral infiltrates and a small right effusion. The heart is enlarged. No pericardial effusion.  The unenhanced appearance of the liver is grossly normal. Multiple gallstones are noted in the gallbladder. Marked common bile duct dilatation measuring up to 14 mm with multiple common bile duct stones. No intrahepatic biliary dilatation. No obvious pancreatic head mass. The pancreatic body and tail were atrophy. The spleen is normal in size. The adrenal glands and kidneys are unremarkable. Extensive renal artery calcifications are noted.  The stomach, duodenum, small bowel and colon are grossly normal without oral contrast. No inflammatory changes or mass lesions. There are scattered air-fluid levels in the small bowel but no significant distention. No mesenteric or retroperitoneal mass or adenopathy. Advanced aortic and branch vessel calcifications are noted but no focal aneurysm.  Dense calcification of the uterus. The ovaries are normal. No pelvic mass, adenopathy or free pelvic fluid collections. There is mild diffuse bladder  wall thickening and there is a moderate amount of air in the bladder likely related to previous instrumentation/catheterization.  The bony structures are unremarkable.  IMPRESSION: 1. Bibasilar infiltrates. 2. Cholelithiasis and choledocholithiasis. Dilated common bile duct but no intrahepatic ductal dilatation. 3. Extensive vascular calcifications. 4. Gallbladder wall thickening and moderate air in the bladder likely from recent instrumentation/catheterization. 5. No retroperitoneal hematoma.   Electronically Signed   By: Loralie Champagne M.D.   On: 04/20/2013 01:20    Scheduled Meds: . aspirin  325 mg Oral Daily  . azithromycin  500 mg Oral Daily  . doxercalciferol  4 mcg Intravenous Q M,W,F-HD  . fluticasone  1 spray Each Nare Daily  . insulin aspart  0-15 Units Subcutaneous TID WC  . insulin detemir  15 Units Subcutaneous QHS  . pregabalin  50 mg Oral Daily   Continuous Infusions:   Principal Problem:   Anemia Active Problems:   End stage renal disease   HTN (hypertension), benign   Diabetes mellitus   Syncope    Time spent: 45 minutes   [facility]  [facility] Pager [phone]. If 7PM-7AM, please contact night-coverage at [email or web address], password [facility] 04/21/2013, 3:44 PM  LOS: 3 days

## 2013-04-21 NOTE — Progress Notes (Signed)
VASCULAR LAB PRELIMINARY  PRELIMINARY  PRELIMINARY  PRELIMINARY  Duplex of AVF completed.    Preliminary report:  Fistula patent.  No stenosis at anastomosis.  Smallest diameter of cephalic vein 5.43 mm just above the AC. Largest diameter 8.06 at the [facility]  Deepest is 17.2 mm at the axilla.  11.2 mm deep in the mid upper arm. There are two branches, one at the mid upper arm measuring 2.59 mm and the second in the upper arm measuring 2.69 mm.  The waveform loses most of its turbulence in the mid and upper arm.  Blaze Nylund, RVT 04/21/2013, 3:27 PM

## 2013-04-22 DIAGNOSIS — D72829 Elevated white blood cell count, unspecified: Secondary | ICD-10-CM | POA: Diagnosis not present

## 2013-04-22 DIAGNOSIS — N185 Chronic kidney disease, stage 5: Secondary | ICD-10-CM | POA: Diagnosis not present

## 2013-04-22 DIAGNOSIS — R55 Syncope and collapse: Secondary | ICD-10-CM | POA: Diagnosis not present

## 2013-04-22 DIAGNOSIS — D649 Anemia, unspecified: Secondary | ICD-10-CM | POA: Diagnosis not present

## 2013-04-22 DIAGNOSIS — N186 End stage renal disease: Secondary | ICD-10-CM | POA: Diagnosis not present

## 2013-04-22 LAB — RENAL FUNCTION PANEL
BUN: 47 mg/dL — ABNORMAL HIGH (ref 6–23)
CO2: 24 mEq/L (ref 19–32)
Chloride: 97 mEq/L (ref 96–112)
Creatinine, Ser: 5.12 mg/dL — ABNORMAL HIGH (ref 0.50–1.10)
GFR calc non Af Amer: 7 mL/min — ABNORMAL LOW (ref >90)
Glucose, Bld: 86 mg/dL (ref 70–99)
Phosphorus: 6.8 mg/dL — ABNORMAL HIGH (ref 2.3–4.6)
Sodium: 141 mEq/L (ref 135–145)

## 2013-04-22 LAB — TYPE AND SCREEN
ABO/RH(D): A POS
Antibody Screen: NEGATIVE
Unit division: 0
Unit division: 0

## 2013-04-22 LAB — CBC
HCT: 30.8 % — ABNORMAL LOW (ref 36.0–46.0)
Hemoglobin: 10.5 g/dL — ABNORMAL LOW (ref 12.0–15.0)
MCH: 30.1 pg (ref 26.0–34.0)
Platelets: 184 10*3/uL (ref 150–400)
RBC: 3.49 MIL/uL — ABNORMAL LOW (ref 3.87–5.11)

## 2013-04-22 LAB — GLUCOSE, CAPILLARY
Glucose-Capillary: 78 mg/dL (ref 70–99)
Glucose-Capillary: 105 mg/dL — ABNORMAL HIGH (ref 70–99)

## 2013-04-22 MED ORDER — ALTEPLASE 2 MG IJ SOLR
2.0000 mg | Freq: Once | INTRAMUSCULAR | Status: AC | PRN
  Filled 2013-04-22: qty 2

## 2013-04-22 MED ORDER — ALBUMIN HUMAN 25 % IV SOLN
INTRAVENOUS | Status: AC
  Filled 2013-04-22: qty 100

## 2013-04-22 MED ORDER — SODIUM CHLORIDE 0.9 % IV SOLN
100.0000 mL | INTRAVENOUS | Status: DC | PRN
  Administered 2013-04-24: 100 mL via INTRAVENOUS
  Administered 2013-04-24: 13:00:00 via INTRAVENOUS

## 2013-04-22 MED ORDER — ALBUMIN HUMAN 25 % IV SOLN
25.0000 g | Freq: Once | INTRAVENOUS | Status: AC
  Administered 2013-04-22: 25 g via INTRAVENOUS

## 2013-04-22 MED ORDER — SODIUM CHLORIDE 0.9 % IV SOLN: 100.0000 mL | INTRAVENOUS | Status: DC | PRN

## 2013-04-22 MED ORDER — HEPARIN SODIUM (PORCINE) 1000 UNIT/ML DIALYSIS
1000.0000 [IU] | INTRAMUSCULAR | Status: DC | PRN
  Filled 2013-04-22: qty 1

## 2013-04-22 MED ORDER — NEPRO/CARBSTEADY PO LIQD
237.0000 mL | ORAL | Status: DC | PRN
  Filled 2013-04-22: qty 237

## 2013-04-22 MED ORDER — PENTAFLUOROPROP-TETRAFLUOROETH EX AERO: 1.0000 "application " | INHALATION_SPRAY | CUTANEOUS | Status: DC | PRN

## 2013-04-22 MED ORDER — CEFAZOLIN SODIUM 1-5 GM-% IV SOLN
1.0000 g | INTRAVENOUS | Status: AC
  Administered 2013-04-24: 1 g via INTRAVENOUS
  Filled 2013-04-22 (×2): qty 50

## 2013-04-22 MED ORDER — LIDOCAINE HCL (PF) 1 % IJ SOLN: 5.0000 mL | INTRAMUSCULAR | Status: DC | PRN

## 2013-04-22 MED ORDER — LIDOCAINE-PRILOCAINE 2.5-2.5 % EX CREA
1.0000 "application " | TOPICAL_CREAM | CUTANEOUS | Status: DC | PRN
  Filled 2013-04-22: qty 5

## 2013-04-22 NOTE — Progress Notes (Signed)
[facility] PROGRESS NOTE  Jackie [name] ZOX:[id number] DOB: Nov 06, 1934 DOA: 04/18/2013 PCP: Evlyn Courier, MD  Interim summary:  [name] past history of end-stage renal disease, hypertension and diabetes mellitus with baseline hemoglobin at 9 who presented on 9/30 after syncopal event and found to have hemoglobin of 4.2. Patient received 4 units packed red blood cells. Evaluated by gastroenterology. Nephrology consulted for dialysis and patient unable to use fistula, so vascular surgery consulted.   Assessment/Plan: Principal Problem:   Anemia: Felt to be secondary to suspected bleed? Hemoccult initially negative and now positive. Monitoring hemoglobin. Stable. Transfer to floor. GI following. Scope from above and below next week.  Active Problems:   End stage renal disease: Status post dialysis today. Nephrology following. Vascular surgery for superficialization of AV fistula on Monday.    HTN (hypertension), benign: Stable.    Diabetes mellitus: Blood pressure stable.    Syncope: Felt to be secondary to anemia. Hemoglobin stable.    Leukocytosis, unspecified: May been secondary to stress margination. No signs of infection. Unable to check urine go to anuria. No fever. White blood cell count resolving.   Code Status: Full code  Family Communication: Plan discussed with patient's daughter at the bedside.  Disposition Plan: Home possibly midweek   Consultants:  [facility] gastroenterology  [facility]-vascular surgery  [facility]-nephrology  Procedures:  Superficial invasion of right arm AV fistula on Monday planned  Endoscopy and colonoscopy early next week  Antibiotics:  None  HPI/Subjective: Patient seen after hemodialysis, currently resting comfortably. No complaints.  Objective: Filed Vitals:   04/22/13 0800  BP: 91/41  Pulse: 57  Temp:   Resp: 28    Intake/Output Summary (Last 24 hours) at 04/22/13 0823 Last data filed at  04/21/13 1100  Gross per 24 hour  Intake      0 ml  Output   2300 ml  Net  -2300 ml   Filed Weights   04/21/13 0755 04/21/13 1100 04/22/13 0650  Weight: 66.8 kg (147 lb 4.3 oz) 65.1 kg (143 lb 8.3 oz) 64.4 kg (141 lb 15.6 oz)    Exam:   General:  Oriented x3, currently resting comfortably post dialysis  Cardiovascular: Regular rate and rhythm, S1-S2, occasional ectopic beat  Respiratory: Clear to auscultation bilaterally  Abdomen: Soft, nontender, nondistended, positive bowel sounds  Musculoskeletal: No clubbing or cyanosis, trace edema , fistula in right upper extremity  Data Reviewed: Basic Metabolic Panel:  Recent Labs Lab 04/19/13 0129 04/19/13 0925 04/20/13 0510 04/21/13 0520 04/22/13 0331  NA 139 140 140 138 141  K 4.9 4.7 4.1 4.0 4.1  CL 99 99 98 98 97  CO2 18* 20 21 23 24   GLUCOSE 162* 94 106* 92 86  BUN 116* 120* 86* 65* 47*  CREATININE 7.73* 8.27* 6.44* 5.97* 5.12*  CALCIUM 7.9* 8.1* 8.2* 8.2* 8.8  PHOS  --   --  6.6* 5.5* 6.8*   Liver Function Tests:  Recent Labs Lab 04/20/13 0510 04/21/13 0520 04/22/13 0331  ALBUMIN 2.6* 2.3* 2.5*   CBC:  Recent Labs Lab 04/18/13 1055  04/19/13 0925 04/19/13 1600 04/20/13 0500 04/21/13 0622 04/22/13 0708  WBC 15.3*  < > 11.2* 13.8* 13.5* 12.4* 11.3*  NEUTROABS 12.6*  --   --   --   --   --   --   HGB 4.2*  < > 9.6* 9.8* 9.9* 9.8* 10.5*  HCT 12.2*  < > 28.0* 28.7* 29.1* 28.7* 30.8*  MCV 85.9  < > 84.6  84.7 86.1 86.4 88.3  PLT 169  < > 127* 154 141* 144* 184  < > = values in this interval not displayed. Cardiac Enzymes: No results found for this basename: CKTOTAL, CKMB, CKMBINDEX, TROPONINI,  in the last 168 hours BNP (last 3 results)  Recent Labs  01/08/13 1042 04/18/13 1055  PROBNP 2385.0* 34669.0*   CBG:  Recent Labs Lab 04/20/13 2157 04/21/13 0710 04/21/13 1159 04/21/13 1707 04/21/13 2117  GLUCAP 203* 86 74 136* 91       Studies: No results found.  Scheduled Meds: .  aspirin  325 mg Oral Daily  . azithromycin  500 mg Oral Daily  . doxercalciferol  4 mcg Intravenous Q M,W,F-HD  . fluticasone  1 spray Each Nare Daily  . insulin aspart  0-15 Units Subcutaneous TID WC  . insulin detemir  15 Units Subcutaneous QHS  . pregabalin  50 mg Oral Daily   Continuous Infusions:   Principal Problem:   Anemia Active Problems:   End stage renal disease   HTN (hypertension), benign   Diabetes mellitus   Syncope   Leukocytosis, unspecified    Time spent: 25 minutes    Hollice Espy  [facility] Pager [phone]. If 7PM-7AM, please contact night-coverage at [email or web address], password [facility] 04/22/2013, 8:23 AM  LOS: 4 days

## 2013-04-22 NOTE — Procedures (Signed)
I have seen and examined this patient and agree with the plan of care. Outpatient schedule for dialysis at [facility] TTS 11 AM. Appreciate [facility]'  Assistance in the management of the patient [facility] W 04/22/2013, 7:49 AM

## 2013-04-22 NOTE — Consult Note (Signed)
Spoke with patient and daughter regarding superficializing her right arm AV fistula on Monday.  Procedure risk and benefit discussed with pt and daughter in law  PE:  Filed Vitals:   04/22/13 0900 04/22/13 0930 04/22/13 1000 04/22/13 1021  BP: 143/65 104/52 109/45 116/56  Pulse: 78 56 84 75  Temp:    97.8 F (36.6 C)  TempSrc:    Oral  Resp: 24 17 21 21  Weight:    136 lb 14.5 oz (62.1 kg)  SpO2:    100%    Right arm: + thrill in fistula  Data: fistula ultrasound reviewed no narrowing  A/P: Superficialize AVF Monday Dr Early preop orders written  Jeriah Corkum, MD [facility] [place] [facility] Office: [phone] Pager: [phone]  

## 2013-04-23 DIAGNOSIS — R55 Syncope and collapse: Secondary | ICD-10-CM | POA: Diagnosis not present

## 2013-04-23 DIAGNOSIS — I10 Essential (primary) hypertension: Secondary | ICD-10-CM | POA: Diagnosis not present

## 2013-04-23 DIAGNOSIS — D649 Anemia, unspecified: Secondary | ICD-10-CM | POA: Diagnosis not present

## 2013-04-23 DIAGNOSIS — N186 End stage renal disease: Secondary | ICD-10-CM | POA: Diagnosis not present

## 2013-04-23 DIAGNOSIS — D631 Anemia in chronic kidney disease: Secondary | ICD-10-CM | POA: Diagnosis not present

## 2013-04-23 DIAGNOSIS — N2581 Secondary hyperparathyroidism of renal origin: Secondary | ICD-10-CM | POA: Diagnosis not present

## 2013-04-23 DIAGNOSIS — E8779 Other fluid overload: Secondary | ICD-10-CM | POA: Diagnosis not present

## 2013-04-23 LAB — CBC
HCT: 36.3 % (ref 36.0–46.0)
Hemoglobin: 12.1 g/dL (ref 12.0–15.0)
MCH: 29.9 pg (ref 26.0–34.0)
MCV: 89.6 fL (ref 78.0–100.0)
RDW: 15.6 % — ABNORMAL HIGH (ref 11.5–15.5)
WBC: 10.6 10*3/uL — ABNORMAL HIGH (ref 4.0–10.5)

## 2013-04-23 LAB — GLUCOSE, CAPILLARY
Glucose-Capillary: 84 mg/dL (ref 70–99)
Glucose-Capillary: 206 mg/dL — ABNORMAL HIGH (ref 70–99)
Glucose-Capillary: 106 mg/dL — ABNORMAL HIGH (ref 70–99)

## 2013-04-23 LAB — RENAL FUNCTION PANEL
Albumin: 3.5 g/dL (ref 3.5–5.2)
BUN: 47 mg/dL — ABNORMAL HIGH (ref 6–23)
CO2: 23 mEq/L (ref 19–32)
Calcium: 9.8 mg/dL (ref 8.4–10.5)
Creatinine, Ser: 4.84 mg/dL — ABNORMAL HIGH (ref 0.50–1.10)
Glucose, Bld: 99 mg/dL (ref 70–99)
Phosphorus: 7 mg/dL — ABNORMAL HIGH (ref 2.3–4.6)
Potassium: 3.8 mEq/L (ref 3.5–5.1)

## 2013-04-23 NOTE — Progress Notes (Signed)
[facility] ROUNDING NOTE   Subjective:   Interval History: comfortable in bed  Objective:  Vital signs in last 24 hours:  Temp:  [98.3 F (36.8 C)-98.7 F (37.1 C)] 98.4 F (36.9 C) (10/05 1132) Pulse Rate:  [39-84] 79 (10/05 1110) Resp:  [15-22] 20 (10/05 1110) BP: (92-118)/(41-59) 94/41 mmHg (10/05 1110) SpO2:  [92 %-100 %] 92 % (10/05 1110) Weight:  [67 kg (147 lb 11.3 oz)] 67 kg (147 lb 11.3 oz) (10/05 0338)  Weight change: -4.7 kg (-10 lb 5.8 oz) Filed Weights   04/22/13 0650 04/22/13 1021 04/23/13 0338  Weight: 64.4 kg (141 lb 15.6 oz) 62.1 kg (136 lb 14.5 oz) 67 kg (147 lb 11.3 oz)    Intake/Output: I/O last 3 completed shifts: In: 240 [P.O.:240] Out: 1571 [Other:1571]   Intake/Output this shift:  Total I/O In: 360 [P.O.:360] Out: -   CVS- RRR RS- CTA ABD- BS present soft non-distended EXT- no edema   Basic Metabolic Panel:  Recent Labs Lab 04/19/13 0925 04/20/13 0510 04/21/13 0520 04/22/13 0331 04/23/13 0521  NA 140 140 138 141 136  K 4.7 4.1 4.0 4.1 3.8  CL 99 98 98 97 92*  CO2 20 21 23 24 23   GLUCOSE 94 106* 92 86 99  BUN 120* 86* 65* 47* 47*  CREATININE 8.27* 6.44* 5.97* 5.12* 4.84*  CALCIUM 8.1* 8.2* 8.2* 8.8 9.8  PHOS  --  6.6* 5.5* 6.8* 7.0*    Liver Function Tests:  Recent Labs Lab 04/20/13 0510 04/21/13 0520 04/22/13 0331 04/23/13 0521  ALBUMIN 2.6* 2.3* 2.5* 3.5   No results found for this basename: LIPASE, AMYLASE,  in the last 168 hours No results found for this basename: AMMONIA,  in the last 168 hours  CBC:  Recent Labs Lab 04/18/13 1055  04/19/13 1600 04/20/13 0500 04/21/13 0622 04/22/13 0708 04/23/13 0521  WBC 15.3*  < > 13.8* 13.5* 12.4* 11.3* 10.6*  NEUTROABS 12.6*  --   --   --   --   --   --   HGB 4.2*  < > 9.8* 9.9* 9.8* 10.5* 12.1  HCT 12.2*  < > 28.7* 29.1* 28.7* 30.8* 36.3  MCV 85.9  < > 84.7 86.1 86.4 88.3 89.6  PLT 169  < > 154 141* 144* 184 209  < > = values in this interval not  displayed.  Cardiac Enzymes: No results found for this basename: CKTOTAL, CKMB, CKMBINDEX, TROPONINI,  in the last 168 hours  BNP: No components found with this basename: POCBNP,   CBG:  Recent Labs Lab 04/21/13 2117 04/22/13 1130 04/22/13 1623 04/22/13 1745 04/22/13 2143  GLUCAP 91 78 106* 95 105*    Microbiology: Results for orders placed during the hospital encounter of 04/18/13  CULTURE, BLOOD (ROUTINE X 2)     Status: None   Collection Time    04/18/13  2:18 PM      Result Value Range Status   Specimen Description BLOOD   Final   Special Requests BOTTLES DRAWN AEROBIC AND ANAEROBIC 10CC LEFT EJ   Final   Culture  Setup Time     Final   Value: 04/18/2013 20:50     Performed at [facility]   Culture     Final   Value:        BLOOD CULTURE RECEIVED NO GROWTH TO DATE CULTURE WILL BE HELD FOR 5 DAYS BEFORE ISSUING A FINAL NEGATIVE REPORT     Performed at [facility]   Report Status PENDING   Incomplete  CULTURE, BLOOD (ROUTINE X 2)     Status: None   Collection Time    04/18/13  2:25 PM      Result Value Range Status   Specimen Description BLOOD HAND LEFT   Final   Special Requests BOTTLES DRAWN AEROBIC ONLY 6CC   Final   Culture  Setup Time     Final   Value: 04/18/2013 20:50     Performed at [facility]   Culture     Final   Value:        BLOOD CULTURE RECEIVED NO GROWTH TO DATE CULTURE WILL BE HELD FOR 5 DAYS BEFORE ISSUING A FINAL NEGATIVE REPORT     Performed at [facility]   Report Status PENDING   Incomplete  MRSA PCR SCREENING     Status: None   Collection Time    04/18/13  7:59 PM      Result Value Range Status   MRSA by PCR NEGATIVE  NEGATIVE Final   Comment:            The GeneXpert MRSA Assay (FDA     approved for NASAL specimens     only), is one component of a     comprehensive MRSA colonization     surveillance program. It is not     intended to diagnose MRSA     infection nor to guide or     monitor  treatment for     MRSA infections.  CULTURE, BLOOD (ROUTINE X 2)     Status: None   Collection Time    04/19/13  3:25 PM      Result Value Range Status   Specimen Description BLOOD HEMODIALYSIS CATHETER   Final   Special Requests BOTTLES DRAWN AEROBIC AND ANAEROBIC 10CC   Final   Culture  Setup Time     Final   Value: 04/20/2013 00:17     Performed at [facility]   Culture     Final   Value:        BLOOD CULTURE RECEIVED NO GROWTH TO DATE CULTURE WILL BE HELD FOR 5 DAYS BEFORE ISSUING A FINAL NEGATIVE REPORT     Performed at [facility]   Report Status PENDING   Incomplete  CULTURE, BLOOD (ROUTINE X 2)     Status: None   Collection Time    04/19/13  3:45 PM      Result Value Range Status   Specimen Description BLOOD HEMODIALYSIS CATHETER   Final   Special Requests     Final   Value: BOTTLES DRAWN AEROBIC AND ANAEROBIC 10CC PT ON AZITHROMYCIN   Culture  Setup Time     Final   Value: 04/19/2013 21:29     Performed at [facility]   Culture     Final   Value:        BLOOD CULTURE RECEIVED NO GROWTH TO DATE CULTURE WILL BE HELD FOR 5 DAYS BEFORE ISSUING A FINAL NEGATIVE REPORT     Performed at [facility]   Report Status PENDING   Incomplete    Coagulation Studies: No results found for this basename: LABPROT, INR,  in the last 72 hours  Urinalysis: No results found for this basename: COLORURINE, APPERANCEUR, LABSPEC, PHURINE, GLUCOSEU, HGBUR, BILIRUBINUR, KETONESUR, PROTEINUR, UROBILINOGEN, NITRITE, LEUKOCYTESUR,  in the last 72 hours    Imaging: No results found.   Medications:     .  aspirin  325 mg Oral Daily  . azithromycin  500 mg Oral Daily  . [START ON 04/24/2013]  ceFAZolin (ANCEF) IV  1 g Intravenous On Call  . doxercalciferol  4 mcg Intravenous Q M,W,F-HD  . fluticasone  1 spray Each Nare Daily  . insulin aspart  0-15 Units Subcutaneous TID WC  . insulin detemir  15 Units Subcutaneous QHS  . pregabalin  50 mg Oral Daily    sodium chloride, sodium chloride, acetaminophen, acetaminophen, albuterol, feeding supplement (NEPRO CARB STEADY), heparin, lidocaine (PF), lidocaine-prilocaine, morphine injection, ondansetron (ZOFRAN) IV, ondansetron, pentafluoroprop-tetrafluoroeth  Assessment/ Plan:  [name] past history of end-stage renal disease, hypertension and diabetes mellitus with baseline hemoglobin at 9 who presented on 9/30 after syncopal event and found to have hemoglobin of 4.2. Patient received 4 units packed red blood cells.    1. [place] ESRD  -status 4 dialysis treatments last HD Saturday 10/4  -outpatient slot [facility] P.M. 2.Anemia  -GI blood loss   -transfused  -possible colonoscopy 3.Bones  -Doxercalciferol with HD  -No binders as calcium and phosphorus controlled 4.HTN/Vol  -Blood pressure better   -Antihypertensives stopped  -fluid status stable for now 5.Access  -Diateck catheter in AM [facility]  -Superficialization per VVS    LOS: 5 Jackie [name] W @TODAY @12 :14 PM

## 2013-04-23 NOTE — Progress Notes (Signed)
[facility] PROGRESS NOTE  Jackie [name] ZOX:[id number] DOB: 08/23/1934 DOA: 04/18/2013 PCP: Evlyn Courier, MD  Interim summary:  [name] past history of end-stage renal disease, hypertension and diabetes mellitus with baseline hemoglobin at 9 who presented on 9/30 after syncopal event and found to have hemoglobin of 4.2. Patient received 4 units packed red blood cells. Evaluated by gastroenterology. Nephrology consulted for dialysis and patient unable to use fistula, so vascular surgery consulted.   Assessment/Plan: Principal Problem:   Anemia: Felt to be secondary to suspected bleed? Hemoccult initially negative and now positive. Monitoring hemoglobin. Stable. Transfer to floor. GI following. Scope from above and below next week.  Active Problems:   End stage renal disease: Status post dialysis today. Nephrology following. Vascular surgery for superficialization of AV fistula on Monday.    HTN (hypertension), benign: Stable.    Diabetes mellitus: Blood pressure stable.    Syncope: Felt to be secondary to anemia. Hemoglobin stable.    Leukocytosis, unspecified: May been secondary to stress margination. No signs of infection. Unable to check urine go to anuria. No fever. White blood cell count cont to improve   Code Status: Full code  Family Communication: Plan discussed with patient's daughter at the bedside.  Disposition Plan: Home possibly midweek   Consultants:  [facility]  [facility] surgery  [facility]-neph[facility]  Procedures:  Superficial invasion of right arm AV fistula on Monday planned  Endoscopy and colonoscopy early next week  Antibiotics:  None  HPI/Subjective: Patient seen after hemodialysis, currently resting comfortably. No complaints.  Objective: Filed Vitals:   04/23/13 0338  BP: 111/51  Pulse: 76  Temp: 98.5 F (36.9 C)  Resp: 18    Intake/Output Summary (Last 24 hours) at 04/23/13  0809 Last data filed at 04/23/13 0338  Gross per 24 hour  Intake    240 ml  Output   1571 ml  Net  -1331 ml   Filed Weights   04/22/13 0650 04/22/13 1021 04/23/13 0338  Weight: 64.4 kg (141 lb 15.6 oz) 62.1 kg (136 lb 14.5 oz) 67 kg (147 lb 11.3 oz)    Exam:   General:  Oriented x3, currently resting comfortably  Cardiovascular: Regular rate and rhythm, S1-S2, occasional ectopic beat  Respiratory: Clear to auscultation bilaterally  Abdomen: Soft, nontender, nondistended, positive bowel sounds  Musculoskeletal: No clubbing or cyanosis, trace edema , fistula in right upper extremity  Data Reviewed: Basic Metabolic Panel:  Recent Labs Lab 04/19/13 0925 04/20/13 0510 04/21/13 0520 04/22/13 0331 04/23/13 0521  NA 140 140 138 141 136  K 4.7 4.1 4.0 4.1 3.8  CL 99 98 98 97 92*  CO2 20 21 23 24 23   GLUCOSE 94 106* 92 86 99  BUN 120* 86* 65* 47* 47*  CREATININE 8.27* 6.44* 5.97* 5.12* 4.84*  CALCIUM 8.1* 8.2* 8.2* 8.8 9.8  PHOS  --  6.6* 5.5* 6.8* 7.0*   Liver Function Tests:  Recent Labs Lab 04/20/13 0510 04/21/13 0520 04/22/13 0331 04/23/13 0521  ALBUMIN 2.6* 2.3* 2.5* 3.5   CBC:  Recent Labs Lab 04/18/13 1055  04/19/13 1600 04/20/13 0500 04/21/13 0622 04/22/13 0708 04/23/13 0521  WBC 15.3*  < > 13.8* 13.5* 12.4* 11.3* 10.6*  NEUTROABS 12.6*  --   --   --   --   --   --   HGB 4.2*  < > 9.8* 9.9* 9.8* 10.5* 12.1  HCT 12.2*  < > 28.7* 29.1* 28.7* 30.8* 36.3  MCV 85.9  < >  84.7 86.1 86.4 88.3 89.6  PLT 169  < > 154 141* 144* 184 209  < > = values in this interval not displayed. Cardiac Enzymes: No results found for this basename: CKTOTAL, CKMB, CKMBINDEX, TROPONINI,  in the last 168 hours BNP (last 3 results)  Recent Labs  01/08/13 1042 04/18/13 1055  PROBNP 2385.0* 34669.0*   CBG:  Recent Labs Lab 04/21/13 2117 04/22/13 1130 04/22/13 1623 04/22/13 1745 04/22/13 2143  GLUCAP 91 78 106* 95 105*       Studies: No results  found.  Scheduled Meds: . aspirin  325 mg Oral Daily  . azithromycin  500 mg Oral Daily  . [START ON 04/24/2013]  ceFAZolin (ANCEF) IV  1 g Intravenous On Call  . doxercalciferol  4 mcg Intravenous Q M,W,F-HD  . fluticasone  1 spray Each Nare Daily  . insulin aspart  0-15 Units Subcutaneous TID WC  . insulin detemir  15 Units Subcutaneous QHS  . pregabalin  50 mg Oral Daily   Continuous Infusions:   Principal Problem:   Anemia Active Problems:   End stage renal disease   HTN (hypertension), benign   Diabetes mellitus   Syncope   Leukocytosis, unspecified    Time spent: 20 minutes    Hollice Espy  [facility] Pager [phone]. If 7PM-7AM, please contact night-coverage at [email or web address], password [facility] 04/23/2013, 8:09 AM  LOS: 5 days

## 2013-04-24 ENCOUNTER — Inpatient Hospital Stay (HOSPITAL_COMMUNITY): Payer: Medicare Other

## 2013-04-24 ENCOUNTER — Encounter (HOSPITAL_COMMUNITY): Payer: Self-pay | Admitting: Certified Registered"

## 2013-04-24 ENCOUNTER — Inpatient Hospital Stay (HOSPITAL_COMMUNITY): Payer: Medicare Other | Admitting: Certified Registered"

## 2013-04-24 ENCOUNTER — Encounter (HOSPITAL_COMMUNITY)
Admission: EM | Disposition: A | Discharge status: Skilled Nursing Facility | Payer: Self-pay | Source: Home / Self Care | Attending: Internal Medicine

## 2013-04-24 DIAGNOSIS — J811 Chronic pulmonary edema: Secondary | ICD-10-CM | POA: Diagnosis not present

## 2013-04-24 DIAGNOSIS — J9600 Acute respiratory failure, unspecified whether with hypoxia or hypercapnia: Secondary | ICD-10-CM | POA: Diagnosis not present

## 2013-04-24 DIAGNOSIS — I509 Heart failure, unspecified: Secondary | ICD-10-CM | POA: Diagnosis not present

## 2013-04-24 DIAGNOSIS — I120 Hypertensive chronic kidney disease with stage 5 chronic kidney disease or end stage renal disease: Secondary | ICD-10-CM | POA: Diagnosis not present

## 2013-04-24 DIAGNOSIS — N186 End stage renal disease: Secondary | ICD-10-CM | POA: Diagnosis not present

## 2013-04-24 DIAGNOSIS — I10 Essential (primary) hypertension: Secondary | ICD-10-CM | POA: Diagnosis not present

## 2013-04-24 DIAGNOSIS — D649 Anemia, unspecified: Secondary | ICD-10-CM | POA: Diagnosis not present

## 2013-04-24 DIAGNOSIS — T82898A Other specified complication of vascular prosthetic devices, implants and grafts, initial encounter: Secondary | ICD-10-CM

## 2013-04-24 DIAGNOSIS — R55 Syncope and collapse: Secondary | ICD-10-CM | POA: Diagnosis not present

## 2013-04-24 DIAGNOSIS — J984 Other disorders of lung: Secondary | ICD-10-CM | POA: Diagnosis not present

## 2013-04-24 DIAGNOSIS — N185 Chronic kidney disease, stage 5: Secondary | ICD-10-CM | POA: Diagnosis not present

## 2013-04-24 DIAGNOSIS — E119 Type 2 diabetes mellitus without complications: Secondary | ICD-10-CM | POA: Diagnosis not present

## 2013-04-24 DIAGNOSIS — D62 Acute posthemorrhagic anemia: Secondary | ICD-10-CM | POA: Diagnosis not present

## 2013-04-24 HISTORY — PX: INSERTION OF DIALYSIS CATHETER: SHX1324

## 2013-04-24 HISTORY — PX: FISTULA SUPERFICIALIZATION: SHX6341

## 2013-04-24 LAB — CULTURE, BLOOD (ROUTINE X 2)
Culture: NO GROWTH
Culture: NO GROWTH

## 2013-04-24 LAB — CBC
MCH: 28.5 pg (ref 26.0–34.0)
MCHC: 32 g/dL (ref 30.0–36.0)
MCV: 89 fL (ref 78.0–100.0)
Platelets: 226 10*3/uL (ref 150–400)
RBC: 3.9 MIL/uL (ref 3.87–5.11)
RDW: 15.4 % (ref 11.5–15.5)
WBC: 10.3 10*3/uL (ref 4.0–10.5)

## 2013-04-24 LAB — RENAL FUNCTION PANEL
Albumin: 3.2 g/dL — ABNORMAL LOW (ref 3.5–5.2)
CO2: 21 mEq/L (ref 19–32)
Calcium: 9.7 mg/dL (ref 8.4–10.5)
Creatinine, Ser: 7.41 mg/dL — ABNORMAL HIGH (ref 0.50–1.10)
GFR calc Af Amer: 5 mL/min — ABNORMAL LOW (ref >90)
GFR calc non Af Amer: 5 mL/min — ABNORMAL LOW (ref >90)
Phosphorus: 9.3 mg/dL — ABNORMAL HIGH (ref 2.3–4.6)
Potassium: 4.3 mEq/L (ref 3.5–5.1)
Sodium: 134 mEq/L — ABNORMAL LOW (ref 135–145)

## 2013-04-24 LAB — GLUCOSE, CAPILLARY
Glucose-Capillary: 115 mg/dL — ABNORMAL HIGH (ref 70–99)
Glucose-Capillary: 94 mg/dL (ref 70–99)

## 2013-04-24 LAB — BASIC METABOLIC PANEL
BUN: 87 mg/dL — ABNORMAL HIGH (ref 6–23)
Chloride: 93 mEq/L — ABNORMAL LOW (ref 96–112)
Creatinine, Ser: 8.54 mg/dL — ABNORMAL HIGH (ref 0.50–1.10)
GFR calc non Af Amer: 4 mL/min — ABNORMAL LOW (ref >90)
Glucose, Bld: 100 mg/dL — ABNORMAL HIGH (ref 70–99)
Potassium: 4.6 mEq/L (ref 3.5–5.1)

## 2013-04-24 LAB — SURGICAL PCR SCREEN: MRSA, PCR: NEGATIVE

## 2013-04-24 LAB — TROPONIN I: Troponin I: <0.3 ng/mL (ref <0.30)

## 2013-04-24 SURGERY — INSERTION OF DIALYSIS CATHETER
Anesthesia: General | Site: Neck | Laterality: Right | Wound class: Clean

## 2013-04-24 MED ORDER — OXYCODONE HCL 5 MG/5ML PO SOLN: 5.0000 mg | Freq: Once | ORAL | Status: DC | PRN

## 2013-04-24 MED ORDER — SODIUM CHLORIDE 0.9 % IV SOLN: INTRAVENOUS | Status: DC

## 2013-04-24 MED ORDER — FENTANYL CITRATE 0.05 MG/ML IJ SOLN: 25.0000 ug | INTRAMUSCULAR | Status: DC | PRN

## 2013-04-24 MED ORDER — OXYCODONE HCL 5 MG PO TABS
5.0000 mg | ORAL_TABLET | Freq: Four times a day (QID) | ORAL | Status: DC | PRN
  Administered 2013-04-25 – 2013-04-26 (×2): 5 mg via ORAL
  Filled 2013-04-24 (×2): qty 1

## 2013-04-24 MED ORDER — FENTANYL CITRATE 0.05 MG/ML IJ SOLN
INTRAMUSCULAR | Status: DC | PRN
  Administered 2013-04-24: 50 ug via INTRAVENOUS
  Administered 2013-04-24: 25 ug via INTRAVENOUS

## 2013-04-24 MED ORDER — PROPOFOL 10 MG/ML IV BOLUS
INTRAVENOUS | Status: DC | PRN
  Administered 2013-04-24: 170 mg via INTRAVENOUS
  Administered 2013-04-24: 90 mg via INTRAVENOUS

## 2013-04-24 MED ORDER — EPHEDRINE SULFATE 50 MG/ML IJ SOLN
INTRAMUSCULAR | Status: DC | PRN
  Administered 2013-04-24: 15 mg via INTRAVENOUS
  Administered 2013-04-24: 10 mg via INTRAVENOUS

## 2013-04-24 MED ORDER — SUCCINYLCHOLINE CHLORIDE 20 MG/ML IJ SOLN
INTRAMUSCULAR | Status: DC | PRN
  Administered 2013-04-24: 120 mg via INTRAVENOUS

## 2013-04-24 MED ORDER — ONDANSETRON HCL 4 MG/2ML IJ SOLN: 4.0000 mg | Freq: Four times a day (QID) | INTRAMUSCULAR | Status: DC | PRN

## 2013-04-24 MED ORDER — 0.9 % SODIUM CHLORIDE (POUR BTL) OPTIME
TOPICAL | Status: DC | PRN
  Administered 2013-04-24: 1000 mL

## 2013-04-24 MED ORDER — SODIUM CHLORIDE 0.9 % IR SOLN
Status: DC | PRN
  Administered 2013-04-24: 13:00:00

## 2013-04-24 MED ORDER — LIDOCAINE HCL (CARDIAC) 10 MG/ML IV SOLN
INTRAVENOUS | Status: DC | PRN
  Administered 2013-04-24: 60 mg via INTRAVENOUS

## 2013-04-24 MED ORDER — HEPARIN SODIUM (PORCINE) 1000 UNIT/ML IJ SOLN
INTRAMUSCULAR | Status: AC
  Filled 2013-04-24: qty 1

## 2013-04-24 MED ORDER — ESMOLOL HCL 10 MG/ML IV SOLN
INTRAVENOUS | Status: DC | PRN
  Administered 2013-04-24: 20 mg via INTRAVENOUS
  Administered 2013-04-24: 30 mg via INTRAVENOUS

## 2013-04-24 MED ORDER — HEPARIN SODIUM (PORCINE) 1000 UNIT/ML IJ SOLN
INTRAMUSCULAR | Status: DC | PRN
  Administered 2013-04-24: 10 mL

## 2013-04-24 MED ORDER — PHENYLEPHRINE HCL 10 MG/ML IJ SOLN
INTRAMUSCULAR | Status: DC | PRN
  Administered 2013-04-24 (×2): 80 ug via INTRAVENOUS
  Administered 2013-04-24: 120 ug via INTRAVENOUS
  Administered 2013-04-24: 80 ug via INTRAVENOUS
  Administered 2013-04-24: 120 ug via INTRAVENOUS

## 2013-04-24 MED ORDER — OXYCODONE HCL 5 MG PO TABS: 5.0000 mg | ORAL_TABLET | Freq: Once | ORAL | Status: DC | PRN

## 2013-04-24 SURGICAL SUPPLY — 65 items
APL SKNCLS STERI-STRIP NONHPOA (GAUZE/BANDAGES/DRESSINGS) ×3
BAG BANDED W/RUBBER/TAPE 36X54 (MISCELLANEOUS) ×3 IMPLANT
BAG DECANTER FOR FLEXI CONT (MISCELLANEOUS) ×3 IMPLANT
BANDAGE ELASTIC 4 VELCRO ST LF (GAUZE/BANDAGES/DRESSINGS) ×3 IMPLANT
BANDAGE GAUZE ELAST BULKY 4 IN (GAUZE/BANDAGES/DRESSINGS) ×3 IMPLANT
BENZOIN TINCTURE PRP APPL 2/3 (GAUZE/BANDAGES/DRESSINGS) ×9 IMPLANT
CANISTER SUCTION 2500CC (MISCELLANEOUS) ×3 IMPLANT
CATH CANNON HEMO 15F 50CM (CATHETERS) IMPLANT
CATH CANNON HEMO 15FR 19 (HEMODIALYSIS SUPPLIES) IMPLANT
CATH CANNON HEMO 15FR 23CM (HEMODIALYSIS SUPPLIES) IMPLANT
CATH CANNON HEMO 15FR 31CM (HEMODIALYSIS SUPPLIES) IMPLANT
CATH CANNON HEMO 15FR 32CM (HEMODIALYSIS SUPPLIES) ×3 IMPLANT
CLIP LIGATING EXTRA MED SLVR (CLIP) ×3 IMPLANT
CLIP LIGATING EXTRA SM BLUE (MISCELLANEOUS) ×3 IMPLANT
COVER DOME SNAP 22 D (MISCELLANEOUS) ×3 IMPLANT
COVER PROBE W GEL 5X96 (DRAPES) ×3 IMPLANT
COVER SURGICAL LIGHT HANDLE (MISCELLANEOUS) ×3 IMPLANT
DECANTER SPIKE VIAL GLASS SM (MISCELLANEOUS) IMPLANT
DRAPE C-ARM 42X72 X-RAY (DRAPES) IMPLANT
DRAPE CHEST BREAST 15X10 FENES (DRAPES) ×3 IMPLANT
ELECT REM PT RETURN 9FT ADLT (ELECTROSURGICAL) ×3
ELECTRODE REM PT RTRN 9FT ADLT (ELECTROSURGICAL) ×2 IMPLANT
GAUZE SPONGE 2X2 8PLY STRL LF (GAUZE/BANDAGES/DRESSINGS) ×2 IMPLANT
GAUZE SPONGE 4X4 16PLY XRAY LF (GAUZE/BANDAGES/DRESSINGS) IMPLANT
GEL ULTRASOUND 20GR AQUASONIC (MISCELLANEOUS) ×3 IMPLANT
GLOVE BIOGEL PI IND STRL 6.5 (GLOVE) ×4 IMPLANT
GLOVE BIOGEL PI IND STRL 7.0 (GLOVE) ×6 IMPLANT
GLOVE BIOGEL PI INDICATOR 6.5 (GLOVE) ×2
GLOVE BIOGEL PI INDICATOR 7.0 (GLOVE) ×3
GLOVE ECLIPSE 6.5 STRL STRAW (GLOVE) ×6 IMPLANT
GLOVE SS BIOGEL STRL SZ 7.5 (GLOVE) ×4 IMPLANT
GLOVE SUPERSENSE BIOGEL SZ 7.5 (GLOVE) ×2
GOWN STRL NON-REIN LRG LVL3 (GOWN DISPOSABLE) ×15 IMPLANT
KIT BASIN OR (CUSTOM PROCEDURE TRAY) ×3 IMPLANT
KIT ROOM TURNOVER OR (KITS) ×3 IMPLANT
NEEDLE 18GX1X1/2 (RX/OR ONLY) (NEEDLE) ×3 IMPLANT
NEEDLE 22X1 1/2 (OR ONLY) (NEEDLE) ×3 IMPLANT
NEEDLE HYPO 25GX1X1/2 BEV (NEEDLE) ×3 IMPLANT
NS IRRIG 1000ML POUR BTL (IV SOLUTION) ×3 IMPLANT
PACK CAROTID (CUSTOM PROCEDURE TRAY) IMPLANT
PACK CV ACCESS (CUSTOM PROCEDURE TRAY) ×3 IMPLANT
PACK SURGICAL SETUP 50X90 (CUSTOM PROCEDURE TRAY) IMPLANT
PAD ARMBOARD 7.5X6 YLW CONV (MISCELLANEOUS) ×6 IMPLANT
SOAP 2 % CHG 4 OZ (WOUND CARE) ×3 IMPLANT
SPONGE GAUZE 2X2 STER 10/PKG (GAUZE/BANDAGES/DRESSINGS) ×1
SPONGE GAUZE 4X4 12PLY (GAUZE/BANDAGES/DRESSINGS) ×3 IMPLANT
SPONGE LAP 18X18 X RAY DECT (DISPOSABLE) ×3 IMPLANT
STRIP CLOSURE SKIN 1/2X4 (GAUZE/BANDAGES/DRESSINGS) ×9 IMPLANT
SUT ETHILON 3 0 PS 1 (SUTURE) ×3 IMPLANT
SUT PROLENE 6 0 CC (SUTURE) ×6 IMPLANT
SUT VIC AB 3-0 SH 27 (SUTURE) ×2
SUT VIC AB 3-0 SH 27X BRD (SUTURE) ×4 IMPLANT
SUT VICRYL 4-0 PS2 18IN ABS (SUTURE) ×6 IMPLANT
SYR 20CC LL (SYRINGE) ×6 IMPLANT
SYR 30ML LL (SYRINGE) IMPLANT
SYR 5ML LL (SYRINGE) ×6 IMPLANT
SYR CONTROL 10ML LL (SYRINGE) ×3 IMPLANT
SYRINGE 10CC LL (SYRINGE) ×3 IMPLANT
TAPE CLOTH SURG 4X10 WHT LF (GAUZE/BANDAGES/DRESSINGS) ×6 IMPLANT
TOWEL OR 17X24 6PK STRL BLUE (TOWEL DISPOSABLE) ×3 IMPLANT
TOWEL OR 17X26 10 PK STRL BLUE (TOWEL DISPOSABLE) ×3 IMPLANT
TUBE CONNECTING 12X1/4 (SUCTIONS) ×3 IMPLANT
UNDERPAD 30X30 INCONTINENT (UNDERPADS AND DIAPERS) ×3 IMPLANT
WATER STERILE IRR 1000ML POUR (IV SOLUTION) ×3 IMPLANT
YANKAUER SUCT BULB TIP NO VENT (SUCTIONS) ×3 IMPLANT

## 2013-04-24 NOTE — Interval H&P Note (Signed)
History and Physical Interval Note:  04/24/2013 12:05 PM  Jackie [name]  has presented today for surgery, with the diagnosis of ESRD  The various methods of treatment have been discussed with the patient and family. After consideration of risks, benefits and other options for treatment, the patient has consented to  Procedure(s): INSERTION OF DIALYSIS CATHETER (N/A) FISTULA SUPERFICIALIZATION (Right) as a surgical intervention .  The patient's history has been reviewed, patient examined, no change in status, stable for surgery.  I have reviewed the patient's chart and labs.  Questions were answered to the patient's satisfaction.     Spike Desilets

## 2013-04-24 NOTE — Consult Note (Signed)
PULMONARY  / CRITICAL CARE MEDICINE  Name: Jackie [name] MRN: [id number] DOB: 02/25/1935    ADMISSION DATE:  04/18/2013 CONSULTATION DATE:  04/24/13  REFERRING MD :  Dr. Rito Ehrlich PRIMARY SERVICE: PCCM  CHIEF COMPLAINT:  Post op intubation, acute respiratory failure   BRIEF PATIENT DESCRIPTION: 78 y/o female post op R IJ dialysis cath and superficial mobility of Right arm AV fistula and ligation with post-operative acute respiratory failure requiring reintubation. PCCM was consulted for possible ventilator management.   SIGNIFICANT EVENTS / STUDIES:  10/6 right IJ dialysis catheter and superficial mobilization of right arm AV fistula and ligation of competing branches   LINES / TUBES: Fistula/ graft R IJ 10/6>>> HD cath 10/6 >>> Fistula/graft R upper arm AV 10/6>>>  CULTURES: MRSA 10/6 >>> Positive Blood cx x 2 10/6>>>  ANTIBIOTICS: Cefazolin x 1 10/6   SUBJECTIVE: vented , retubed  VITAL SIGNS: Temp:  [97.8 F (36.6 C)-98.7 F (37.1 C)] 97.8 F (36.6 C) (10/06 1500) Pulse Rate:  [71-77] 71 (10/06 1000) Resp:  [14-18] 18 (10/06 1000) BP: (92-114)/(50-56) 92/51 mmHg (10/06 1000) SpO2:  [99 %-100 %] 100 % (10/06 1000) FiO2 (%):  [60 %] 60 % (10/06 1514) Weight:  [138 lb 0.1 oz (62.6 kg)] 138 lb 0.1 oz (62.6 kg) (10/06 0441)  HEMODYNAMICS:   VENTILATOR SETTINGS: Vent Mode:  [-] PSV FiO2 (%):  [60 %] 60 % PEEP:  [5 cmH20] 5 cmH20  INTAKE / OUTPUT: Intake/Output     10/05 0701 - 10/06 0700 10/06 0701 - 10/07 0700   P.O. 480 0   I.V. (mL/kg)  500 (8)   Total Intake(mL/kg) 480 (7.7) 500 (8)   Other     Stool 1    Total Output 1     Net +479 +500          PHYSICAL EXAMINATION: General:  WDWN female, post- op mildly sedated Neuro:  Alert and oriented, follows commands but lethragic HEENT: NCAT, PERRL, ETT present Cardiovascular:  RRR, no m/r/g Lungs:  Clear to auscultation bilaterally, no accessory muscle use Abdomen:  Soft, nontender, + BS Musculoskeletal:  MAEs, SCDs in place, no edema Skin:  Warm and dry  LABS:  CBC Recent Labs     04/22/13  0708  04/23/13  0521  04/24/13  0706  WBC  11.3*  10.6*  10.3  HGB  10.5*  12.1  11.1*  HCT  30.8*  36.3  34.7*  PLT  184  209  226   Coag's No results found for this basename: APTT, INR,  in the last 72 hours BMET Recent Labs     04/22/13  0331  04/23/13  0521  04/24/13  0706  NA  141  136  134*  K  4.1  3.8  4.3  CL  97  92*  89*  CO2  24  23  21   BUN  47*  47*  77*  CREATININE  5.12*  4.84*  7.41*  GLUCOSE  86  99  92   Electrolytes Recent Labs     04/22/13  0331  04/23/13  0521  04/24/13  0706  CALCIUM  8.8  9.8  9.7  PHOS  6.8*  7.0*  9.3*   Liver Enzymes Recent Labs     04/22/13  0331  04/23/13  0521  04/24/13  0706  ALBUMIN  2.5*  3.5  3.2*   Glucose Recent Labs     04/23/13  1101  04/23/13  1714  04/23/13  2051  04/24/13  0806  04/24/13  1137  04/24/13  1502  GLUCAP  206*  79  106*  81  85  115*     CXR 10/6:cath well placed, no frank edema or pneumothorax EKG 10/6: Qt prolongation and PVC  ASSESSMENT / PLAN:  PULMONARY A:  Acute respiratory failure- likely secondary to intraoperative sedation residual, r/0 cardiac event? P:   - patient required post op reintubation -noted on PS 16, volumes are good, reduce PS to goal 5, assess cough, gag, assess RSBI - pcXR for edema -likely able to extubate -see cvs  CARDIOVASCULAR A:  QTc prolongation - mild Hypertension Mild intra op hypotension- r/o ischemia P:  -Repeat EKG-neg st changes -cardiac panel  -mg/ phos  -cbc   RENAL A:   ESRD Hyponatremia  P:   - Bmet post op - HD per renal- will need to be called likely in am , fluid noted in fissure - continue IV fluids, reduce this   Global Update: PCCM consulted for required reintubation of patient following post-operative acute respiratory failure likely due to intraoperative sedation. Patient was able to be extubated in the PACU. She  is currently stable on 2L nasal cannula with Sp02 99%. Patient continued to be monitored in PACU but PCCM not needed for further monitoring of patient at this time.  Consider back to 5th floor after above work up and observation  Corrie Baglia PA-S [employer]   I have personally obtained a history, examined the patient, evaluated laboratory and imaging results, formulated the assessment and plan and placed orders.   Mcarthur Rossetti. Tyson Alias, MD, FACP Pgr: [phone] [facility]  Pulmonary and Critical Care Medicine [facility] Pager: [phone]  04/24/2013, 5:04 PM

## 2013-04-24 NOTE — Progress Notes (Signed)
The patient was extubated in the operating room. She had poor respiratory effort and was reintubated and transferred to the PACU. She is alert and follows commands on the vent currently and is waking up a bit. She did have some transient hypotension and also had had fibrillation which was treated by anesthesia and is now normal sinus rhythm with a regular rate. I discussed this with Dr. Rito Ehrlich he was aware and will coordinate with critical care medicine.

## 2013-04-24 NOTE — Transfer of Care (Signed)
Immediate Anesthesia Transfer of Care Note  Patient: Jackie [name]  Procedure(s) Performed: Procedure(s): INSERTION OF DIALYSIS CATHETER (Right) FISTULA SUPERFICIALIZATION (Right)  Patient Location: PACU  Anesthesia Type:General  Level of Consciousness: awake and responds to stimulation  Airway & Oxygen Therapy: Patient remains intubated per anesthesia plan and Patient placed on Ventilator (see vital sign flow sheet for setting)  Post-op Assessment: Report given to PACU RN and Post -op Vital signs reviewed and stable  Post vital signs: Reviewed and stable  Complications: Patient re-intubated

## 2013-04-24 NOTE — H&P (View-Only) (Signed)
Spoke with patient and daughter regarding superficializing her right arm AV fistula on Monday.  Procedure risk and benefit discussed with pt and daughter in law  PE:  Filed Vitals:   04/22/13 0900 04/22/13 0930 04/22/13 1000 04/22/13 1021  BP: 143/65 104/52 109/45 116/56  Pulse: 78 56 84 75  Temp:    97.8 F (36.6 C)  TempSrc:    Oral  Resp: 24 17 21 21   Weight:    136 lb 14.5 oz (62.1 kg)  SpO2:    100%    Right arm: + thrill in fistula  Data: fistula ultrasound reviewed no narrowing  A/P: Superficialize AVF Monday Dr Early preop orders written  Fabienne Bruns, MD [facility] Office: [phone] Pager: [phone]

## 2013-04-24 NOTE — Op Note (Signed)
OPERATIVE REPORT  DATE OF SURGERY: 04/24/2013  PATIENT: Jackie [name], 78 y.o. female MRN: [id number]  DOB: October 09, 1934  PRE-OPERATIVE DIAGNOSIS: End-stage renal disease  POST-OPERATIVE DIAGNOSIS:  Same  PROCEDURE: #1 right IJ dialysis catheter #2 superficial mobilization of right arm AV fistula and ligation of competing branches  SURGEON:  Gretta Began, M.D.  PHYSICIAN ASSISTANT: Rhyne  ANESTHESIA:  Gen.  EBL: Minimal ml  Total I/O In: 500 [I.V.:500] Out: -   BLOOD ADMINISTERED: None  DRAINS: None  SPECIMEN: None  COUNTS CORRECT:  YES  PLAN OF CARE: PACU   PATIENT DISPOSITION:  PACU - hemodynamically stable  PROCEDURE DETAILS: The patient was taken to the upper and placed supine position where the area of the right neck was prepped and draped in usual sterile fashion. The patient had an indwelling temporary dialysis catheter. This was prepped as well. A guidewire was passed through the distal port of the indwelling catheter and the catheter was removed. This was at the level of the right atrium this was confirmed with fluoroscopy. A dilator and peel-away sheath was passed over the guidewire and a 27 cm hemodialysis catheter was passed through the peel-away sheath which was removed. The catheter was brought through a subcutaneous tunnel through a separate stab incision and the 2 lm ports were attached. Both lumens flushed and aspirated easily and were locked with 1000 unit per cc heparin. The distal tips were positioned the level of the distal right atrium. This was confirmed with fluoroscopy.  Attention was then turned to the right arm which was prepped and draped in usual sterile fashion. SonoSite ultrasound was used to mark the level of the cephalic vein from the antecubital space to the shoulder. 2 incisions were made over this and the superficial fat was mobilized off the cephalic vein. The fat was resected for approximately 2 cm. The cephalic vein was mobilized out of its  fascia from the antecubital space to the shoulder. 2 large competing branches were ligated with 3-0 silk ties. There did appear to be potential lab near the antecubital space. The vein was quite dilated and then became smaller caliber. For this reason the vein was occluded near the brachial artery anastomosis with a vascular clamp and the vein was opened transversely in the dilated segment. 33 and half for an and 4-1/2 sequential dilators were passed through this. There did appear to be a web that was [employer]. The incision the vein was closed with a running 6-0 Prolene suture. Clamps removed and good thrill noted. Wound irrigated with saline hemostasis obtained electrocautery. The wounds were closed with 3-0 Vicryl in a subcuticular tissue. Benzoin stricture for applied. A Kerlix and Ace wrap were placed over this. This was transferred to the recovery in stable condition   Gretta Began, M.D. 04/24/2013 2:45 PM

## 2013-04-24 NOTE — Anesthesia Preprocedure Evaluation (Addendum)
Anesthesia Evaluation  Patient identified by MRN, date of birth, ID band Patient awake    Reviewed: Allergy & Precautions, H&P , NPO status , Patient's Chart, lab work & pertinent test results  Airway Mallampati: II  Neck ROM: full    Dental  (+) Edentulous Upper, Edentulous Lower and Dental Advisory Given   Pulmonary          Cardiovascular hypertension, Rhythm:Regular Rate:Normal     Neuro/Psych    GI/Hepatic   Endo/Other  diabetes, Type 2  Renal/GU ESRF and DialysisRenal disease     Musculoskeletal  (+) Arthritis -,   Abdominal   Peds  Hematology   Anesthesia Other Findings   Reproductive/Obstetrics                         Anesthesia Physical Anesthesia Plan  ASA: III  Anesthesia Plan: General   Post-op Pain Management:    Induction: Intravenous  Airway Management Planned: LMA  Additional Equipment:   Intra-op Plan:   Post-operative Plan:   Informed Consent: I have reviewed the patients History and Physical, chart, labs and discussed the procedure including the risks, benefits and alternatives for the proposed anesthesia with the patient or authorized representative who has indicated his/her understanding and acceptance.     Plan Discussed with: CRNA, Anesthesiologist and Surgeon  Anesthesia Plan Comments:         Anesthesia Quick Evaluation

## 2013-04-24 NOTE — Progress Notes (Signed)
[facility] PROGRESS NOTE  SHI GROSE JWJ:[id number] DOB: [birth year] DOA: 04/18/2013 PCP: Evlyn Courier, MD  Interim summary:  [name] past history of end-stage renal disease, hypertension and diabetes mellitus with baseline hemoglobin at 9 who presented on 9/30 after syncopal event and found to have hemoglobin of 4.2. Patient received 4 units packed red blood cells. Evaluated by gastroenterology. Nephrology consulted for dialysis and patient unable to use fistula, so vascular surgery consulted.   Assessment/Plan: Principal Problem:   Anemia: Felt to be secondary to suspected bleed? Hemoccult initially negative and now positive. Monitoring hemoglobin. Stable. Transfer to floor. GI following. Scope from above and below next week.  Active Problems:   End stage renal disease: Status post dialysis today. Nephrology following. Vascular surgery for superficialization of AV fistula plus temporary catheter placed today.    HTN (hypertension), benign: Stable.    Diabetes mellitus: Blood pressure stable.  SVT: Briefly post surgery. Now normal sinus rhythm    Syncope: Felt to be secondary to anemia. Hemoglobin stable.    Leukocytosis, unspecified: Resolved May been secondary to stress margination. No signs of infection. Unable to check urine go to anuria. No fever.    Code Status: Full code  Family Communication: Left message with family  Disposition Plan: Home possibly midweek   Consultants:  [facility]  [facility]-vascular surgery  [facility]  Procedures:  Superficial invasion of right arm AV fistula done 10/6  Placement dietek catheter 10/6  Endoscopy and colonoscopy early next week  Antibiotics:  None  HPI/Subjective: Seen after OR. Currently awake and alert with no complaints  Objective: Filed Vitals:   04/24/13 0519  BP: 112/50  Pulse: 77  Temp: 98.7 F (37.1 C)  Resp: 14    Intake/Output Summary  (Last 24 hours) at 04/24/13 0937 Last data filed at 04/24/13 0700  Gross per 24 hour  Intake    120 ml  Output      1 ml  Net    119 ml   Filed Weights   04/23/13 0338 04/23/13 2042 04/24/13 0441  Weight: 67 kg (147 lb 11.3 oz) 62.6 kg (138 lb 0.1 oz) 62.6 kg (138 lb 0.1 oz)    Exam:   General:  Oriented x3, no complaints  Cardiovascular: Regular rate and rhythm, S1-S2, occasional ectopic beat  Respiratory: Clear to auscultation bilaterally  Abdomen: Soft, nontender, nondistended, positive bowel sounds  Musculoskeletal: No clubbing or cyanosis, trace edema , fistula in right upper extremity  Data Reviewed: Basic Metabolic Panel:  Recent Labs Lab 04/19/13 0925 04/20/13 0510 04/21/13 0520 04/22/13 0331 04/23/13 0521  NA 140 140 138 141 136  K 4.7 4.1 4.0 4.1 3.8  CL 99 98 98 97 92*  CO2 20 21 23 24 23   GLUCOSE 94 106* 92 86 99  BUN 120* 86* 65* 47* 47*  CREATININE 8.27* 6.44* 5.97* 5.12* 4.84*  CALCIUM 8.1* 8.2* 8.2* 8.8 9.8  PHOS  --  6.6* 5.5* 6.8* 7.0*   Liver Function Tests:  Recent Labs Lab 04/20/13 0510 04/21/13 0520 04/22/13 0331 04/23/13 0521  ALBUMIN 2.6* 2.3* 2.5* 3.5   CBC:  Recent Labs Lab 04/18/13 1055  04/20/13 0500 04/21/13 0622 04/22/13 0708 04/23/13 0521 04/24/13 0706  WBC 15.3*  < > 13.5* 12.4* 11.3* 10.6* 10.3  NEUTROABS 12.6*  --   --   --   --   --   --   HGB 4.2*  < > 9.9* 9.8* 10.5* 12.1 11.1*  HCT 12.2*  < >  29.1* 28.7* 30.8* 36.3 34.7*  MCV 85.9  < > 86.1 86.4 88.3 89.6 89.0  PLT 169  < > 141* 144* 184 209 226  < > = values in this interval not displayed. Cardiac Enzymes: No results found for this basename: CKTOTAL, CKMB, CKMBINDEX, TROPONINI,  in the last 168 hours BNP (last 3 results)  Recent Labs  01/08/13 1042 04/18/13 1055  PROBNP 2385.0* 34669.0*   CBG:  Recent Labs Lab 04/23/13 0818 04/23/13 1101 04/23/13 1714 04/23/13 2051 04/24/13 0806  GLUCAP 84 206* 79 106* 81       Studies: No results  found.  Scheduled Meds: . aspirin  325 mg Oral Daily  . azithromycin  500 mg Oral Daily  .  ceFAZolin (ANCEF) IV  1 g Intravenous On Call  . doxercalciferol  4 mcg Intravenous Q M,W,F-HD  . fluticasone  1 spray Each Nare Daily  . insulin aspart  0-15 Units Subcutaneous TID WC  . insulin detemir  15 Units Subcutaneous QHS  . pregabalin  50 mg Oral Daily   Continuous Infusions:   Principal Problem:   Anemia Active Problems:   End stage renal disease   HTN (hypertension), benign   Diabetes mellitus   Syncope   Leukocytosis, unspecified    Time spent: 15 minutes    Hollice Espy  [facility] Pager [phone]. If 7PM-7AM, please contact night-coverage at [email or web address], password [facility] 04/24/2013, 9:37 AM  LOS: 6 days

## 2013-04-24 NOTE — OR Nursing (Signed)
Removed patient's earrings prior to prepping. One purple and one black earring with small rhinestones placed into biohazard bag with patient label. Will transport to PACU with patient post-operatively.

## 2013-04-24 NOTE — Evaluation (Signed)
Occupational Therapy Evaluation Patient Details Name: Jackie [name] MRN: [id number] DOB: [birth year] Today's Date: 04/24/2013 Time: 4098-1191 OT Time Calculation (min): 15 min  OT Assessment / Plan / Recommendation History of present illness Pt is a 78 y.o. female with a history of hypertension, diabetes mellitus, chronic kidney disease. She reported generalized weakness and fatigue & was admitted w/ dx: ESRD & anemia.   Clinical Impression   Pt presents w/ deficits related to ADL's and self care secondary to ESRD and anemia. Pt lives alone w/ intermittent family assist. She will benefit from acute OT to assist w/ maximizing independence & decrease burden of care at next venue.     OT Assessment  Patient needs continued OT Services    Follow Up Recommendations  SNF    Barriers to Discharge Other (comment) Lives alone w/ grandson in/out, goes to school.  Equipment Recommendations  None recommended by OT    Recommendations for Other Services    Frequency  Min 2X/week    Precautions / Restrictions Precautions Precautions: Fall Restrictions Weight Bearing Restrictions: No   Pertinent Vitals/Pain No c/o.    ADL  Eating/Feeding: NPO Grooming: Performed;Wash/dry hands;Wash/dry face;Set up Where Assessed - Grooming: Supine, head of bed up Upper Body Bathing: Simulated;Minimal assistance Where Assessed - Upper Body Bathing: Supine, head of bed up Lower Body Bathing: Simulated;Maximal assistance Where Assessed - Lower Body Bathing: Supported sit to stand Upper Body Dressing: Simulated;Minimal assistance Where Assessed - Upper Body Dressing: Supine, head of bed up Lower Body Dressing: Simulated;Maximal assistance Where Assessed - Lower Body Dressing: Supported sit to stand Transfers/Ambulation Related to ADLs: Transfers/ambulation deferred secondary to pt going for surgery therefore TBA as able. ADL Comments: Pt participated in ADL/grooming tasks bed level this am prior to going  to surgery. Pt/family were also educated verbally in Role of OT w/ focus on ADL's & functional mobility transfers. Discussed SNF rehab w/ pt & she verbalized understanding/agreement of this.    OT Diagnosis: Generalized weakness  OT Problem List: Decreased strength;Decreased activity tolerance;Decreased knowledge of use of DME or AE;Other (comment) (Generalized weakness) OT Treatment Interventions: Self-care/ADL training;Therapeutic exercise;Energy conservation;DME and/or AE instruction;Patient/family education;Therapeutic activities   OT Goals(Current goals can be found in the care plan section) Acute Rehab OT Goals Patient Stated Goal: To get stronger, "I've been in this bed and am weak" Time For Goal Achievement: 05/08/13 Potential to Achieve Goals: Good  Visit Information  Last OT Received On: 04/24/13 Assistance Needed: +1 History of Present Illness: Pt is a 78 y.o. female with a history of hypertension, diabetes mellitus, chronic kidney disease. She reported generalized weakness and fatigue & was admitted w/ dx: ESRD & anemia.       Prior Functioning     Home Living Family/patient expects to be discharged to:: Private residence Living Arrangements: Alone Type of Home: Apartment Home Access: Level entry Home Layout: One level Home Equipment: Walker - 2 wheels;Cane - single point Prior Function Level of Independence: Independent Comments: has grandson who stays with her during the day; he does go to school and she is alone at night  Communication Communication: No difficulties Dominant Hand: Right    Vision/Perception Vision - History Patient Visual Report: No change from baseline   Cognition  Cognition Arousal/Alertness: Awake/alert Behavior During Therapy: WFL for tasks assessed/performed Overall Cognitive Status: Within Functional Limits for tasks assessed    Extremity/Trunk Assessment Upper Extremity Assessment Upper Extremity Assessment: Generalized  weakness Lower Extremity Assessment Lower Extremity Assessment: Generalized weakness;Defer to PT  evaluation     Mobility Bed Mobility Bed Mobility: Sit to Supine;Scooting to HOB Sit to Supine: 3: Mod assist;With rail;HOB flat Scooting to HOB: 3: Mod assist;With rail (Bed inverted for gravity assist) Details for Bed Mobility Assistance: VC's for hand placement, controlled decent and UE/LE assist due to general weakness/deconditioning. Transfers Transfers: Not assessed Details for Transfer Assistance: Pt awaiting transport to surgery, TBA. Please refer to PT assessment.        Balance     End of Session OT - End of Session Activity Tolerance: Patient limited by fatigue;Patient limited by lethargy Patient left: in bed;with call bell/phone within reach;with nursing/sitter in room;with family/visitor present Nurse Communication: Mobility status;Other (comment) (Pt going to surgery this am)  GO     Alm Bustard 04/24/2013, 12:54 PM

## 2013-04-24 NOTE — Preoperative (Signed)
Beta Blockers   Reason not to administer Beta Blockers:Not Applicable 

## 2013-04-24 NOTE — Anesthesia Procedure Notes (Addendum)
Procedure Name: LMA Insertion Date/Time: 04/24/2013 12:40 PM Performed by: Jefm Miles E Pre-anesthesia Checklist: Patient identified, Emergency Drugs available, Suction available, Patient being monitored and Timeout performed Patient Re-evaluated:Patient Re-evaluated prior to inductionOxygen Delivery Method: Circle system utilized Preoxygenation: Pre-oxygenation with 100% oxygen Intubation Type: IV induction LMA: LMA inserted LMA Size: 4.0 Number of attempts: 1 Placement Confirmation: positive ETCO2 and breath sounds checked- equal and bilateral Tube secured with: Tape Dental Injury: Teeth and Oropharynx as per pre-operative assessment    Procedure Name: Intubation Date/Time: 04/24/2013 1:15 PM Performed by: Arlice Colt B Pre-anesthesia Checklist: Patient identified Patient Re-evaluated:Patient Re-evaluated prior to inductionOxygen Delivery Method: Circle system utilized Preoxygenation: Pre-oxygenation with 100% oxygen Laryngoscope Size: Mac and 3 Grade View: Grade I Tube type: Oral Tube size: 7.5 mm Number of attempts: 1 Airway Equipment and Method: Stylet Placement Confirmation: ETT inserted through vocal cords under direct vision,  positive ETCO2 and breath sounds checked- equal and bilateral Secured at: 21 cm Tube secured with: Tape Dental Injury: Teeth and Oropharynx as per pre-operative assessment

## 2013-04-24 NOTE — Evaluation (Signed)
Physical Therapy Evaluation Patient Details Name: Jackie [name] MRN: [id number] DOB: Jul 27, 1934 Today's Date: 04/24/2013 Time: 1012-1036 PT Time Calculation (min): 24 min  PT Assessment / Plan / Recommendation History of Present Illness   Pt is a 78 y.o. Female adm with dx of ESRD and anemia; pt PMH includes HTN, DM and CKD; reports generalized weakness over the past few weeks.   Clinical Impression  Pt presents with deficits listed below (see problem list) . Would benefit from skilled PT in acute setting to maximize functional mobility and increase strength. Pt lives alone and will benefit from [facility] upon acute D/C to increase independence prior to returning home. Pt is a fall risk.     PT Assessment  Patient needs continued PT services    Follow Up Recommendations  SNF;Supervision/Assistance - 24 hour    Does the patient have the potential to tolerate intense rehabilitation      Barriers to Discharge Decreased caregiver support      Equipment Recommendations  Other (comment)    Recommendations for Other Services     Frequency Min 3X/week    Precautions / Restrictions Precautions Precautions: Fall Restrictions Weight Bearing Restrictions: No   Pertinent Vitals/Pain No c/o pain       Mobility  Bed Mobility Bed Mobility: Supine to Sit;Sit to Supine;Sitting - Scoot to Edge of Bed Supine to Sit: 4: Min assist;HOB elevated;With rails Sitting - Scoot to Edge of Bed: 5: Supervision Sit to Supine: 4: Min assist;HOB flat;With rail Scooting to [facility]: 6: Modified independent (Device/Increase time) Details for Bed Mobility Assistance: pt requires (A) advancing LEs off/on EOB; cues for hand placement and sequencing; pt requires incr time for bed mobility due to fatigue  Transfers Transfers: Sit to Stand;Stand to Sit Sit to Stand: 3: Mod assist;With upper extremity assist;From bed Stand to Sit: 4: Min assist;To bed;With upper extremity assist Details for Transfer Assistance: pt  with difficulty powering up to stand; performed sit to stand x2; pt c/o dizziniess and requested to return to supine; mod cues for hand placement and safety with transfers Ambulation/Gait Ambulation/Gait Assistance: Not tested (comment) Stairs: No Wheelchair Mobility Wheelchair Mobility: No    Exercises Total Joint Exercises Ankle Circles/Pumps: AROM;Both;10 reps;Supine   PT Diagnosis: Difficulty walking;Generalized weakness  PT Problem List: Decreased strength;Decreased activity tolerance;Decreased safety awareness;Decreased balance;Decreased mobility PT Treatment Interventions: DME instruction;Gait training;Functional mobility training;Therapeutic activities;Therapeutic exercise;Neuromuscular re-education;Balance training     PT Goals(Current goals can be found in the care plan section) Acute Rehab PT Goals Patient Stated Goal: to get stronger  PT Goal Formulation: With patient Time For Goal Achievement: 05/08/13 Potential to Achieve Goals: Good  Visit Information  Last PT Received On: 04/24/13 Assistance Needed: +1       Prior Functioning  Home Living Family/patient expects to be discharged to:: Skilled nursing facility Living Arrangements: Alone Type of Home: Apartment Home Access: Level entry Home Layout: One level Home Equipment: Walker - 2 wheels;Cane - single point Prior Function Level of Independence: Independent Communication Communication: No difficulties    Cognition  Cognition Arousal/Alertness: Awake/alert Behavior During Therapy: WFL for tasks assessed/performed Overall Cognitive Status: Within Functional Limits for tasks assessed    Extremity/Trunk Assessment Upper Extremity Assessment Upper Extremity Assessment: Defer to OT evaluation Lower Extremity Assessment Lower Extremity Assessment: Generalized weakness Cervical / Trunk Assessment Cervical / Trunk Assessment: Kyphotic   Balance Balance Balance Assessed: Yes Static Sitting Balance Static  Sitting - Balance Support: Bilateral upper extremity supported;Feet supported Static Sitting - Level  of Assistance: 5: Stand by assistance Static Sitting - Comment/# of Minutes: pt tolerated sitting EOB ~5 min for deep breathing exercises to help with dizziness Static Standing Balance Static Standing - Balance Support: During functional activity;Bilateral upper extremity supported Static Standing - Level of Assistance: 3: Mod assist Static Standing - Comment/# of Minutes: pt with posterior lean and is unsteady with static standing   End of Session PT - End of Session Equipment Utilized During Treatment: Gait belt;Oxygen Activity Tolerance: Patient limited by fatigue Patient left: in bed;with call bell/phone within reach;with family/visitor present Nurse Communication: Mobility status  GP     Donell Sievert, [place] [phone] 04/24/2013, 4:54 PM

## 2013-04-25 LAB — CULTURE, BLOOD (ROUTINE X 2): Culture: NO GROWTH

## 2013-04-25 LAB — GLUCOSE, CAPILLARY
Glucose-Capillary: 105 mg/dL — ABNORMAL HIGH (ref 70–99)
Glucose-Capillary: 185 mg/dL — ABNORMAL HIGH (ref 70–99)
Glucose-Capillary: 93 mg/dL (ref 70–99)

## 2013-04-25 LAB — RENAL FUNCTION PANEL
Albumin: 3 g/dL — ABNORMAL LOW (ref 3.5–5.2)
CO2: 17 mEq/L — ABNORMAL LOW (ref 19–32)
Chloride: 89 mEq/L — ABNORMAL LOW (ref 96–112)
GFR calc Af Amer: 4 mL/min — ABNORMAL LOW (ref >90)
GFR calc non Af Amer: 4 mL/min — ABNORMAL LOW (ref >90)
Glucose, Bld: 109 mg/dL — ABNORMAL HIGH (ref 70–99)
Sodium: 133 mEq/L — ABNORMAL LOW (ref 135–145)

## 2013-04-25 MED ORDER — SODIUM CHLORIDE 0.9 % IV SOLN: 100.0000 mL | INTRAVENOUS | Status: DC | PRN

## 2013-04-25 MED ORDER — PREGABALIN 25 MG PO CAPS
25.0000 mg | ORAL_CAPSULE | Freq: Every day | ORAL | Status: DC
  Administered 2013-04-26: 25 mg via ORAL
  Filled 2013-04-25: qty 1

## 2013-04-25 MED ORDER — ALTEPLASE 2 MG IJ SOLR: 2.0000 mg | Freq: Once | INTRAMUSCULAR | Status: DC | PRN

## 2013-04-25 NOTE — Procedures (Signed)
I was present at this dialysis session. I have reviewed the session itself and made appropriate changes.   Qb 310 via CVC.  S/p tunneled CVC and superficialized AVF on RUE yesterday.  Has chair TTS at [facility].  If leaves on Thursday will need to go to [facility] at 1pm on Friday to do paperwork.  First treatment would be Saturday.    Pt groggy but appropriate.  Will reduce pregabalin to 25 qhs.    Sabra Heck  MD 04/25/2013, 4:08 PM

## 2013-04-25 NOTE — Care Management Note (Addendum)
   CARE MANAGEMENT NOTE 04/25/2013  Patient:  Jackie [name], Jackie [name]   Account Number:  [id number]  Date Initiated:  04/19/2013  Documentation initiated by:  Donn Pierini  Subjective/Objective Assessment:   Pt admitted with anemia hgb 4.2  new start to HD     Action/Plan:   PTA pt lived at home- NCM to follow  04/25/2013 Met with pt re d/c plan, PT recommending SNF, pt plans to go home has help in the home and selected [facility] for [facility] and HHPT   Anticipated DC Date:  04/27/2013   Anticipated DC Plan:  HOME W HOME HEALTH SERVICES      DC Planning Services  CM consult      Choice offered to / List presented to:          [facility] arranged  HH-1 RN  HH-2 PT      [facility] agency  [facility]   Status of service:  In process, will continue to follow Medicare Important Message given?   (If response is "NO", the following Medicare IM given date fields will be blank) Date Medicare IM given:   Date Additional Medicare IM given:    Discharge Disposition:    Per UR Regulation:  Reviewed for med. necessity/level of care/duration of stay  If discussed at Long Length of Stay Meetings, dates discussed:    Comments:   04/25/2013 Pt son in and varified that pt will have assistance at home and has transportation to dialysis. HH information shared with son.                Johny Shock RN MPH, case manager, [phone]   04/25/2013 Noted PT recommendation for SNF, however upon discussion with pt , she plans to d/c to home, has assistance in the home and selected [facility] for HHPT and HHRN. Is aware of dialysis days and has transportion with [facility] transportation in place. Johny Shock RN MPH, [phone]   04/24/13- 1100- Donn Pierini RN, BSN [phone]  New Start ESRD  -status 4 dialysis treatments last HD Saturday 10/4 outpatient slot [phone] TTS P.M. Diateck catheter this AM (10/6)  per [facility]

## 2013-04-25 NOTE — Progress Notes (Signed)
Patient ID: Jackie [name], female   DOB: July 02, 1935, 78 y.o.   MRN: [id number] No complaints. Comfortable. Once white female for breakfast.  Dialysis catheter in place. Ace wrap override arm. Will check wound tomorrow

## 2013-04-25 NOTE — Progress Notes (Signed)
Pt does not have PIV access. Dr. Rito Ehrlich said it's okay to keep it out.  Jackie [name], [name]

## 2013-04-25 NOTE — Progress Notes (Signed)
[facility] PROGRESS NOTE  Jackie [name] ZOX:[id number] DOB: 1935-07-08 DOA: 04/18/2013 PCP: Evlyn Courier, MD  Interim summary:  [name] past history of end-stage renal disease, hypertension and diabetes mellitus with baseline hemoglobin at 9 who presented on 9/30 after syncopal event and found to have hemoglobin of 4.2. Patient received 4 units packed red blood cells. Evaluated by gastroenterology. Nephrology consulted for dialysis and patient unable to use fistula, so vascular surgery consulted.   Assessment/Plan: Principal Problem:   Anemia: Felt to be secondary to suspected bleed? Hemoccult initially negative and now positive. Monitoring hemoglobin. Stable. Gastroenterology deferring as outpatient for colonoscopy. Patient's aspirin is being discontinued and PPI started  Active Problems:   End stage renal disease: Status post dialysis today. Nephrology following. Vascular surgery for superficialization of AV fistula plus temporary catheter placed Monday. Stable. Dialysis today and next session scheduled for Thursday can be done as outpatient    HTN (hypertension), benign: Stable.    Diabetes mellitus: Blood pressure stable.  SVT: Briefly post surgery. Now normal sinus rhythm    Syncope: Felt to be secondary to anemia. Hemoglobin stable.    Leukocytosis, unspecified: Resolved May been secondary to stress margination. No signs of infection. Unable to check urine go to anuria. No fever.    Code Status: Full code  Family Communication: Spoke to son  Disposition Plan: Home tomorrow with home health. Patient declined skilled nursing   Consultants:  [facility] gastroenterology  [facility]-vascular surgery  [facility]-nephrology  Procedures:  Superficial invasion of right arm AV fistula done 10/6  Placement dietek catheter 10/6  Status post 2 unit packed red blood cell transfusion on 9/30  Antibiotics:  None  HPI/Subjective: Patient doing  okay. No complaints.  Objective: Filed Vitals:   04/25/13 1708  BP: 125/57  Pulse: 86  Temp:   Resp:     Intake/Output Summary (Last 24 hours) at 04/25/13 1759 Last data filed at 04/25/13 0900  Gross per 24 hour  Intake    600 ml  Output      0 ml  Net    600 ml   Filed Weights   04/24/13 0441 04/24/13 2109 04/25/13 1510  Weight: 62.6 kg (138 lb 0.1 oz) 63 kg (138 lb 14.2 oz) 66.4 kg (146 lb 6.2 oz)    Exam:   General:  Oriented x3, no complaints  Cardiovascular: Regular rate and rhythm, S1-S2, occasional ectopic beat  Respiratory: Clear to auscultation bilaterally  Abdomen: Soft, nontender, nondistended, positive bowel sounds  Musculoskeletal: No clubbing or cyanosis, trace edema , fistula in right upper extremity  Data Reviewed: Basic Metabolic Panel:  Recent Labs Lab 04/22/13 0331 04/23/13 0521 04/24/13 0706 04/24/13 1958 04/25/13 0500  NA 141 136 134* 138 133*  K 4.1 3.8 4.3 4.6 4.6  CL 97 92* 89* 93* 89*  CO2 24 23 21  18* 17*  GLUCOSE 86 99 92 100* 109*  BUN 47* 47* 77* 87* 95*  CREATININE 5.12* 4.84* 7.41* 8.54* 8.93*  CALCIUM 8.8 9.8 9.7 8.9 8.8  MG  --   --   --  2.5  --   PHOS 6.8* 7.0* 9.3* 10.5* 10.8*   Liver Function Tests:  Recent Labs Lab 04/21/13 0520 04/22/13 0331 04/23/13 0521 04/24/13 0706 04/25/13 0500  ALBUMIN 2.3* 2.5* 3.5 3.2* 3.0*   CBC:  Recent Labs Lab 04/20/13 0500 04/21/13 0622 04/22/13 0708 04/23/13 0521 04/24/13 0706  WBC 13.5* 12.4* 11.3* 10.6* 10.3  HGB 9.9* 9.8* 10.5* 12.1 11.1*  HCT  29.1* 28.7* 30.8* 36.3 34.7*  MCV 86.1 86.4 88.3 89.6 89.0  PLT 141* 144* 184 209 226   Cardiac Enzymes:  Recent Labs Lab 04/24/13 2000  TROPONINI <0.30   BNP (last 3 results)  Recent Labs  01/08/13 1042 04/18/13 1055  PROBNP 2385.0* 34669.0*   CBG:  Recent Labs Lab 04/24/13 1502 04/24/13 1813 04/24/13 2112 04/25/13 0730 04/25/13 1211  GLUCAP 115* 88 94 105* 185*       Studies: Dg Chest Port 1  View  04/24/2013   CLINICAL DATA:  Catheter placement  EXAM: PORTABLE CHEST - 1 VIEW  COMPARISON:  04/24/2013  FINDINGS: Dual lumen right subclavian central line identified, with proximal tip over the right atrium and the distal tip also over the right atrium. No pneumothorax. Mild cardiomegaly. Mild fine interstitial prominence diffusely. Moderate to significant vascular congestion.  IMPRESSION: Central line as described. Mild pulmonary edema.   Electronically Signed   By: Esperanza Heir M.D.   On: 04/24/2013 19:13   Dg Chest Port 1 View  04/24/2013   CLINICAL DATA:  Status post intubation.  EXAM: PORTABLE CHEST - 1 VIEW  COMPARISON:  Single view of the chest 04/19/2013.  FINDINGS: Endotracheal tube is in place with its tip 1.3 cm about the carina. The tube could be withdrawn 1-2 cm. Aeration is markedly improved since the prior examination. The right lung is now clear. There is some residual left basilar airspace disease. No pneumothorax or pleural fluid. Heart size is upper normal.  IMPRESSION: Endotracheal tube is 1.3 cm above the carina. The tube could be withdrawn 1-2 cm for better positioning.  Marked improvement in airspace disease.   Electronically Signed   By: Drusilla Kanner M.D.   On: 04/24/2013 15:52    Scheduled Meds: . aspirin  325 mg Oral Daily  . azithromycin  500 mg Oral Daily  . doxercalciferol  4 mcg Intravenous Q M,W,F-HD  . fluticasone  1 spray Each Nare Daily  . insulin aspart  0-15 Units Subcutaneous TID WC  . insulin detemir  15 Units Subcutaneous QHS  . [START ON 04/26/2013] pregabalin  25 mg Oral Daily   Continuous Infusions:   Principal Problem:   Anemia Active Problems:   End stage renal disease   HTN (hypertension), benign   Diabetes mellitus   Syncope   Leukocytosis, unspecified    Time spent: 20 minutes    Hollice Espy  [facility] Pager [phone]. If 7PM-7AM, please contact night-coverage at [email or web address], password [facility] 04/25/2013, 5:59  PM  LOS: 7 days

## 2013-04-25 NOTE — Progress Notes (Signed)
04/25/2013 4:06 PM Hemodialysis Outpatient Note: this patient is anticipated to discharge on Thursday the 9th. To begin treatment at [facility] dialysis center on Saturday she will need to visit the center on Friday the 10th at 1 PM to sign paperwork/consents. Thank you.Jackie [name]

## 2013-04-26 DIAGNOSIS — N185 Chronic kidney disease, stage 5: Secondary | ICD-10-CM | POA: Diagnosis not present

## 2013-04-26 DIAGNOSIS — R55 Syncope and collapse: Secondary | ICD-10-CM | POA: Diagnosis not present

## 2013-04-26 DIAGNOSIS — Z23 Encounter for immunization: Secondary | ICD-10-CM | POA: Diagnosis not present

## 2013-04-26 DIAGNOSIS — N189 Chronic kidney disease, unspecified: Secondary | ICD-10-CM | POA: Diagnosis not present

## 2013-04-26 DIAGNOSIS — E8779 Other fluid overload: Secondary | ICD-10-CM | POA: Diagnosis not present

## 2013-04-26 DIAGNOSIS — E119 Type 2 diabetes mellitus without complications: Secondary | ICD-10-CM | POA: Diagnosis not present

## 2013-04-26 DIAGNOSIS — I10 Essential (primary) hypertension: Secondary | ICD-10-CM | POA: Diagnosis not present

## 2013-04-26 DIAGNOSIS — Z992 Dependence on renal dialysis: Secondary | ICD-10-CM | POA: Diagnosis not present

## 2013-04-26 DIAGNOSIS — D72829 Elevated white blood cell count, unspecified: Secondary | ICD-10-CM | POA: Diagnosis not present

## 2013-04-26 DIAGNOSIS — E1129 Type 2 diabetes mellitus with other diabetic kidney complication: Secondary | ICD-10-CM | POA: Diagnosis not present

## 2013-04-26 DIAGNOSIS — Z5189 Encounter for other specified aftercare: Secondary | ICD-10-CM | POA: Diagnosis not present

## 2013-04-26 DIAGNOSIS — D509 Iron deficiency anemia, unspecified: Secondary | ICD-10-CM | POA: Diagnosis not present

## 2013-04-26 DIAGNOSIS — N2581 Secondary hyperparathyroidism of renal origin: Secondary | ICD-10-CM | POA: Diagnosis not present

## 2013-04-26 DIAGNOSIS — N186 End stage renal disease: Secondary | ICD-10-CM | POA: Diagnosis not present

## 2013-04-26 DIAGNOSIS — D631 Anemia in chronic kidney disease: Secondary | ICD-10-CM | POA: Diagnosis not present

## 2013-04-26 DIAGNOSIS — D649 Anemia, unspecified: Secondary | ICD-10-CM | POA: Diagnosis not present

## 2013-04-26 LAB — CULTURE, BLOOD (ROUTINE X 2)

## 2013-04-26 LAB — RENAL FUNCTION PANEL
Albumin: 3 g/dL — ABNORMAL LOW (ref 3.5–5.2)
Calcium: 8.6 mg/dL (ref 8.4–10.5)
Creatinine, Ser: 6.07 mg/dL — ABNORMAL HIGH (ref 0.50–1.10)
GFR calc Af Amer: 7 mL/min — ABNORMAL LOW (ref >90)
Glucose, Bld: 109 mg/dL — ABNORMAL HIGH (ref 70–99)
Phosphorus: 6.3 mg/dL — ABNORMAL HIGH (ref 2.3–4.6)
Potassium: 4.5 mEq/L (ref 3.5–5.1)
Sodium: 135 mEq/L (ref 135–145)

## 2013-04-26 LAB — GLUCOSE, CAPILLARY: Glucose-Capillary: 150 mg/dL — ABNORMAL HIGH (ref 70–99)

## 2013-04-26 MED ORDER — ALPRAZOLAM 0.25 MG PO TABS: 0.2500 mg | ORAL_TABLET | Freq: Three times a day (TID) | ORAL | Status: DC | PRN

## 2013-04-26 MED ORDER — OXYCODONE HCL 5 MG PO TABS: 5.0000 mg | ORAL_TABLET | ORAL | Status: DC | PRN

## 2013-04-26 MED ORDER — CALCIUM ACETATE 667 MG PO CAPS
1334.0000 mg | ORAL_CAPSULE | Freq: Three times a day (TID) | ORAL | Status: DC
  Administered 2013-04-26: 1334 mg via ORAL
  Filled 2013-04-26 (×3): qty 2

## 2013-04-26 MED ORDER — DOXERCALCIFEROL 4 MCG/2ML IV SOLN: 4.0000 ug | INTRAVENOUS | Status: DC

## 2013-04-26 NOTE — Progress Notes (Signed)
Admit: 04/18/2013 LOS: 8  103F new start HD and admission for worsened anemia.    Subjective:  PT and OT rec SNF HD yesterday, uneventufl, 1.2L UF Has chair at [facility] Pt w/o complaints PhosLo started for hyperphosphatemia  10/07 0701 - 10/08 0700 In: 240 [P.O.:240] Out: 1240   Filed Weights   04/25/13 1510 04/25/13 1807 04/25/13 2256  Weight: 66.4 kg (146 lb 6.2 oz) 63.1 kg (139 lb 1.8 oz) 63.64 kg (140 lb 4.8 oz)    Current meds: reviewed, hectoral qHD, PhosLo 2 tabs qAC, pregabalin 25 Current Labs: reviewed   Physical Exam:  Blood pressure 128/54, pulse 87, temperature 99 F (37.2 C), temperature source Oral, resp. rate 16, height 5\' 9"  (1.753 m), weight 63.64 kg (140 lb 4.8 oz), SpO2 90.00%. NAD, more awake today RRR CTAB ABD s/nt R UE bandaged No LEE  Assessment/Plan 1. ESRD, new start HD: using CVC after superficialization of RUE AVF 10/6.  Will go to [facility] upon d/c.   2. Anemia: stable.  Hb 11.1 last checked.  Will need ESA if drifts down.  GI w/u pending.  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  On VDRA.  Ca 8.6, Phos 6.3 4. HTN: stable and controlled  Sabra Heck MD 04/26/2013, 11:23 AM   Recent Labs Lab 04/24/13 1958 04/25/13 0500 04/26/13 0830  NA 138 133* 135  K 4.6 4.6 4.5  CL 93* 89* 95*  CO2 18* 17* 21  GLUCOSE 100* 109* 109*  BUN 87* 95* 47*  CREATININE 8.54* 8.93* 6.07*  CALCIUM 8.9 8.8 8.6  PHOS 10.5* 10.8* 6.3*    Recent Labs Lab 04/22/13 0708 04/23/13 0521 04/24/13 0706  WBC 11.3* 10.6* 10.3  HGB 10.5* 12.1 11.1*  HCT 30.8* 36.3 34.7*  MCV 88.3 89.6 89.0  PLT 184 209 226    Current Facility-Administered Medications  Medication Dose Route Frequency Provider Last Rate Last Dose  . acetaminophen (TYLENOL) tablet 650 mg  650 mg Oral Q6H PRN Maryann Mikhail, DO   650 mg at 04/22/13 1510   Or  . acetaminophen (TYLENOL) suppository 650 mg  650 mg Rectal Q6H PRN Maryann Mikhail, DO      .  albuterol (PROVENTIL HFA;VENTOLIN HFA) 108 (90 BASE) MCG/ACT inhaler 1-2 puff  1-2 puff Inhalation Q6H PRN Maryann Mikhail, DO      . aspirin tablet 325 mg  325 mg Oral Daily Maryann Mikhail, DO   325 mg at 04/26/13 7829  . azithromycin (ZITHROMAX) tablet 500 mg  500 mg Oral Daily Elby Showers, MD   500 mg at 04/26/13 5621  . calcium acetate (PHOSLO) capsule 1,334 mg  1,334 mg Oral TID WC Arita Miss, MD      . doxercalciferol (HECTOROL) injection 4 mcg  4 mcg Intravenous Q M,W,F-HD Garnetta Buddy, MD   4 mcg at 04/21/13 0900  . feeding supplement (NEPRO CARB STEADY) liquid 237 mL  237 mL Oral PRN Garnetta Buddy, MD      . fluticasone [facility]) 50 MCG/ACT nasal spray 1 spray  1 spray Each Nare Daily Maryann Mikhail, DO   1 spray at 04/26/13 3086  . heparin injection 1,000 Units  1,000 Units Dialysis PRN Garnetta Buddy, MD      . insulin aspart (novoLOG) injection 0-15 Units  0-15 Units Subcutaneous TID WC Maryann Mikhail, DO   3 Units at 04/25/13 1254  . insulin detemir (LEVEMIR) injection 15 Units  15  Units Subcutaneous QHS Maryann Mikhail, DO   15 Units at 04/26/13 0003  . lidocaine (PF) (XYLOCAINE) 1 % injection 5 mL  5 mL Intradermal PRN Garnetta Buddy, MD      . lidocaine-prilocaine (EMLA) cream 1 application  1 application Topical PRN Garnetta Buddy, MD      . morphine 2 MG/ML injection 1 mg  1 mg Intravenous Q4H PRN Maryann Mikhail, DO   1 mg at 04/21/13 0947  . ondansetron (ZOFRAN) tablet 4 mg  4 mg Oral Q6H PRN Maryann Mikhail, DO       Or  . ondansetron (ZOFRAN) injection 4 mg  4 mg Intravenous Q6H PRN Maryann Mikhail, DO      . oxyCODONE (Oxy IR/ROXICODONE) immediate release tablet 5 mg  5 mg Oral Q6H PRN Ames Coupe Rhyne, PA-C   5 mg at 04/26/13 0049  . pentafluoroprop-tetrafluoroeth (GEBAUERS) aerosol 1 application  1 application Topical PRN Garnetta Buddy, MD      . pregabalin (LYRICA) capsule 25 mg  25 mg Oral Daily Arita Miss, MD   25 mg at 04/26/13 [phone]

## 2013-04-26 NOTE — Discharge Summary (Signed)
Physician Discharge Summary  GRETTEL RAMES [name]:[id number] DOB: 04/26/1935 DOA: 04/18/2013  PCP: Evlyn Courier, MD  Admit date: 04/18/2013 Discharge date: 04/26/2013  Time spent: 35 minutes  Recommendations for Outpatient Follow-up:  1. Patient is being discharged to short-term skilled nursing for physical therapy and rehabilitation 2. She will continue to get dialysis on Tuesday, Thursday and Saturday. Next section is scheduled for 10/9  Discharge Diagnoses:  Principal Problem:   Anemia Active Problems:   End stage renal disease   HTN (hypertension), benign   Diabetes mellitus   Syncope   Leukocytosis, unspecified   Discharge Condition: Improved, being discharged to skilled nursing facility  Diet recommendation: Renal low-sodium diet  Filed Weights   04/25/13 1510 04/25/13 1807 04/25/13 2256  Weight: 66.4 kg (146 lb 6.2 oz) 63.1 kg (139 lb 1.8 oz) 63.64 kg (140 lb 4.8 oz)    History of present illness:  Patient is a 78 year old Afro-American female past history hypertension, diabetes mellitus and chronic kidney disease who has not yet started dialysis who presented to the emergency room after passing out. Reportedly the family, she does not for 10-15 minutes. Emergent issues that have sinus bradycardia and a recent case of bronchitis in her medical history. She no previous history of syncope. She's also never had a colonoscopy. She was noted however to have a hemoglobin of 4.1. Her baseline was 10-11. Patient was transfused 4 units packed red blood cells. Hemoccult was noted to be negative. She was transferred to step down unit.  Hospital Course:  Principal Problem:   Anemia: Patient was felt to be secondary to suspected bleed. Hemoglobin initially was negative. Lab studies indicated part to blood loss, part chronic disease from kidneys. Patient's aspirin discontinued.  GI plan to do EGD and colonoscopy, however more pressing problem with worsening renal failure. EGD and  colonoscopy was held off until the following week. At that point however it had to be further deferred because patient required placement of dietetic catheter and AV fistula superficialization.  At this point, patient had no further anemia drops and it was felt that scope could be done as an outpatient. Patient has made indications that she would really prefer not to have this done at all. Active Problems:   End stage renal disease: Following admission, patient started becoming weaker with loss of taste and appetite. She already had an AV fistula in place. Nephrology was consulted who felt she was having uremia and needed urgent dialysis. Patient underwent several sessions and then felt it was time to start her on formal dialysis. Vascular surgery was consulted and patient was taken to the operating room for superficialization of her AV fistula and placement of a dietetic catheter short-term until the fistula was ready. Patient's stable at this point and on dialysis with outpatient session scheduled for Thursday, Saturday and Tuesday.    HTN (hypertension), benign: Stable. Better after dialysis. We'll continue metoprolol. Hold on Norvasc and Lasix given dialysis status now.    Diabetes mellitus: Stable. Patient continued on her home dose of Lantus plus sliding scale. Her sugars actually remained controlled despite her issues throughout his hospitalization.   Syncope: This was attributed to syncope in part from chronic and worsening anemia plus possible acute blood loss. No issue since.   Leukocytosis, unspecified: Patient she presented with some mild stress margination from syncope, worsening uremia. Patient was some concern for possible pneumonia and she was put on Zithromax. This could have been attributed to possible worsening edema from impending volume  overload versus respiratory infection. Patient did complete a seven-day course of antibiotics prior to discharge.    Procedures: Superficial  invasion of right arm AV fistula done 10/6  Placement dietek catheter 10/6  Status post 2 unit packed red blood cell transfusion on 9/30   Consultations:  Early-vascular surgery  Webb-nephrology  Feinstein-critical care  Ganem-gastroenterology  Discharge Exam: Filed Vitals:   04/26/13 1020  BP: 128/54  Pulse: 87  Temp: 99 F (37.2 C)  Resp: 16    General: Alert and oriented x3, no acute distress Cardiovascular: Regular rate and rhythm, S1-S2 Respiratory: Clear auscultation bilaterally Abdomen: Soft, nontender, nondistended, positive bowel sounds Extremities: No clubbing cyanosis or edema, fistula in right arm  Discharge Instructions  Discharge Orders   Future Appointments Provider Department Dept Phone   05/23/2013 9:45 AM Larina Earthly, MD [facility] [phone]   Future Orders Complete By Expires   Diet - low sodium heart healthy  As directed    Increase activity slowly  As directed        Medication List    STOP taking these medications       amLODipine 10 MG tablet  Commonly known as:  NORVASC     furosemide 80 MG tablet  Commonly known as:  LASIX      TAKE these medications       albuterol 108 (90 BASE) MCG/ACT inhaler  Commonly known as:  PROVENTIL HFA;VENTOLIN HFA  Inhale 1-2 puffs into the lungs every 6 (six) hours as needed for wheezing.     ALKA-SELTZER PO  Take 1 tablet by mouth as needed. For indigestion and heartburn     ALPRAZolam 0.25 MG tablet  Commonly known as:  XANAX  Take 1 tablet (0.25 mg total) by mouth 3 (three) times daily as needed for sleep or anxiety.        calcitRIOL 0.25 MCG capsule  Commonly known as:  ROCALTROL  Take 0.25 mcg by mouth daily.     FOSRENOL 1000 MG chewable tablet  Generic drug:  lanthanum  Chew 1,000 mg by mouth 3 (three) times daily with meals.     insulin aspart 100 UNIT/ML injection  Commonly known as:  novoLOG  Inject 1-15 Units into the skin daily.      insulin detemir 100 UNIT/ML injection  Commonly known as:  LEVEMIR  Inject 15 Units into the skin at bedtime.     IRON PO  Take 1 tablet by mouth daily.     metoprolol 50 MG tablet  Commonly known as:  LOPRESSOR  Take 50 mg by mouth 2 (two) times daily.     mometasone 50 MCG/ACT nasal spray  Commonly known as:  NASONEX  Place 2 sprays into the nose daily as needed. For nasal congestion     oxyCODONE 5 MG immediate release tablet  Commonly known as:  ROXICODONE  Take 1 tablet (5 mg total) by mouth every 4 (four) hours as needed for pain.     pregabalin 50 MG capsule  Commonly known as:  LYRICA  Take 50 mg by mouth 3 (three) times daily.     TUMS ULTRA PO  Take 1 tablet by mouth daily as needed. For acid reflux       No Known Allergies     Follow-up Information   Follow up with EARLY, TODD, MD In 4 weeks. (Office will call you to arrange your appt (sent))    Specialty:  Vascular Surgery  Contact information:   [address] [place] [address] [phone]       Follow up with Evlyn Courier, MD In 1 month.   Specialty:  Family Medicine   Contact information:   [address] [place] [address] [phone]        The results of significant diagnostics from this hospitalization (including imaging, microbiology, ancillary and laboratory) are listed below for reference.    Significant Diagnostic Studies: Ct Abdomen Pelvis Wo Contrast  04/20/2013   CLINICAL DATA:  Retroperitoneal bleed.  EXAM: CT ABDOMEN AND PELVIS WITHOUT CONTRAST  TECHNIQUE: Multidetector CT imaging of the abdomen and pelvis was performed following the standard protocol without intravenous contrast.  COMPARISON:  None.  FINDINGS: The lung bases demonstrate bilateral infiltrates and a small right effusion. The heart is enlarged. No pericardial effusion.  The unenhanced appearance of the liver is grossly normal. Multiple gallstones are noted in the gallbladder. Marked common bile duct  dilatation measuring up to 14 mm with multiple common bile duct stones. No intrahepatic biliary dilatation. No obvious pancreatic head mass. The pancreatic body and tail were atrophy. The spleen is normal in size. The adrenal glands and kidneys are unremarkable. Extensive renal artery calcifications are noted.  The stomach, duodenum, small bowel and colon are grossly normal without oral contrast. No inflammatory changes or mass lesions. There are scattered air-fluid levels in the small bowel but no significant distention. No mesenteric or retroperitoneal mass or adenopathy. Advanced aortic and branch vessel calcifications are noted but no focal aneurysm.  Dense calcification of the uterus. The ovaries are normal. No pelvic mass, adenopathy or free pelvic fluid collections. There is mild diffuse bladder wall thickening and there is a moderate amount of air in the bladder likely related to previous instrumentation/catheterization.  The bony structures are unremarkable.  IMPRESSION: 1. Bibasilar infiltrates. 2. Cholelithiasis and choledocholithiasis. Dilated common bile duct but no intrahepatic ductal dilatation. 3. Extensive vascular calcifications. 4. Gallbladder wall thickening and moderate air in the bladder likely from recent instrumentation/catheterization. 5. No retroperitoneal hematoma.   Electronically Signed   By: Loralie Champagne M.D.   On: 04/20/2013 01:20   Dg Chest 2 View  04/18/2013   CLINICAL DATA:  Short of breath and wheezing  EXAM: CHEST  2 VIEW  COMPARISON:  01/08/2013  FINDINGS: Cardiac enlargement. Diffuse bilateral airspace disease, most consistent with pulmonary edema. Small effusions and mild bibasilar atelectasis.  IMPRESSION: Congestive heart failure with diffuse pulmonary edema.   Electronically Signed   By: Marlan Palau M.D.   On: 04/18/2013 09:59   Ir Fluoro Guide Cv Line Right  04/19/2013   *RADIOLOGY REPORT*  NON TUNNELED HD CATHETER PLACEMENT WITH ULTRASOUND AND FLUOROSCOPIC  GUIDANCE  Clinical History: 77 year-old female with end-stage renal disease on hemodialysis.  She has a relatively new arteriovenous fistula which was access for first-time today and found to be poorly functioning.  She requires urgent hemodialysis.  She currently has a mild leukocytosis and is febrile.  Therefore, a temporary hemodialysis catheter will be placed.  After her urgent hemodialysis, we can consider fistulogram and / or conversion to a tunneled hemodialysis catheter  Fluoroscopy Time: 1 minute 24 seconds  Procedure:  The right neck was prepped with chlorhexidine, draped in the usual sterile fashion using maximum barrier technique (cap and mask, sterile gown, sterile gloves, large sterile sheet, hand hygiene and cutaneous antiseptic).  Local anesthesia was attained by infiltration with 1% lidocaine.  Ultrasound demonstrated patency of the  right internal jugular vein, and this was documented with an image.  Under real-time ultrasound guidance, this vein was accessed with a 21 gauge micropuncture needle and image documentation was performed. The wire was advanced through the right heart and into the inferior vena cava.  The skin tract was then dilated and ultimately a 20 cm Bard Trialysis catheter was advanced over the wire and positioned with the tip in the mid right atrium.  Fluoroscopy during the procedure and fluoro spot radiograph confirms appropriate catheter position.  The catheter was flushed, secured to the skin with Prolene sutures, and covered with a sterile dressing.  Complications:  None.  The patient tolerated the procedure well.  IMPRESSION:  Successful placement of a right IJ approach Trialysis catheter with sonographic and fluoroscopic guidance.  The catheter is ready for use.  Signed,  Sterling Big, MD [facility]   Original Report Authenticated By: Malachy Moan, M.D.   Ir US Guide Vasc Access Right  04/19/2013    *RADIOLOGY REPORT*  NON TUNNELED HD CATHETER PLACEMENT WITH ULTRASOUND AND FLUOROSCOPIC GUIDANCE  Clinical History: 78 year-old female with end-stage renal disease on hemodialysis.  She has a relatively new arteriovenous fistula which was access for first-time today and found to be poorly functioning.  She requires urgent hemodialysis.  She currently has a mild leukocytosis and is febrile.  Therefore, a temporary hemodialysis catheter will be placed.  After her urgent hemodialysis, we can consider fistulogram and / or conversion to a tunneled hemodialysis catheter  Fluoroscopy Time: 1 minute 24 seconds  Procedure:  The right neck was prepped with chlorhexidine, draped in the usual sterile fashion using maximum barrier technique (cap and mask, sterile gown, sterile gloves, large sterile sheet, hand hygiene and cutaneous antiseptic).  Local anesthesia was attained by infiltration with 1% lidocaine.  Ultrasound demonstrated patency of the right internal jugular vein, and this was documented with an image.  Under real-time ultrasound guidance, this vein was accessed with a 21 gauge micropuncture needle and image documentation was performed. The wire was advanced through the right heart and into the inferior vena cava.  The skin tract was then dilated and ultimately a 20 cm Bard Trialysis catheter was advanced over the wire and positioned with the tip in the mid right atrium.  Fluoroscopy during the procedure and fluoro spot radiograph confirms appropriate catheter position.  The catheter was flushed, secured to the skin with Prolene sutures, and covered with a sterile dressing.  Complications:  None.  The patient tolerated the procedure well.  IMPRESSION:  Successful placement of a right IJ approach Trialysis catheter with sonographic and fluoroscopic guidance.  The catheter is ready for use.  Signed,  Sterling Big, MD [facility]   Original Report  Authenticated By: Malachy Moan, M.D.   Dg Chest Port 1 View  04/24/2013   CLINICAL DATA:  Catheter placement  EXAM: PORTABLE CHEST - 1 VIEW  COMPARISON:  04/24/2013  FINDINGS: Dual lumen right subclavian central line identified, with proximal tip over the right atrium and the distal tip also over the right atrium. No pneumothorax. Mild cardiomegaly. Mild fine interstitial prominence diffusely. Moderate to significant vascular congestion.  IMPRESSION: Central line as described. Mild pulmonary edema.   Electronically Signed   By: Esperanza Heir M.D.   On: 04/24/2013 19:13   Dg Chest Port 1 View  04/24/2013   CLINICAL DATA:  Status post intubation.  EXAM: PORTABLE CHEST -  1 VIEW  COMPARISON:  Single view of the chest 04/19/2013.  FINDINGS: Endotracheal tube is in place with its tip 1.3 cm about the carina. The tube could be withdrawn 1-2 cm. Aeration is markedly improved since the prior examination. The right lung is now clear. There is some residual left basilar airspace disease. No pneumothorax or pleural fluid. Heart size is upper normal.  IMPRESSION: Endotracheal tube is 1.3 cm above the carina. The tube could be withdrawn 1-2 cm for better positioning.  Marked improvement in airspace disease.   Electronically Signed   By: Drusilla Kanner M.D.   On: 04/24/2013 15:52   Dg Chest Port 1 View  04/19/2013   CLINICAL DATA:  Fever, CHF, short of breath  EXAM: PORTABLE CHEST - 1 VIEW  COMPARISON:  04/18/2013.  FINDINGS: Diffuse bilateral airspace disease remains present. Cardiomegaly with partially obscured pericardial silhouette by airspace disease. In the right upper lobe, the airspace disease appears more prominent. The differential considerations include multifocal pneumonia, pulmonary edema or a combination of these 2 entities. There is no pleural effusion or pneumothorax identified. Monitoring leads project over the chest.  IMPRESSION: Slight worsening of airspace disease, likely representing  asymmetric pulmonary edema and CHF.   Electronically Signed   By: Andreas Newport M.D.   On: 04/19/2013 07:05    Microbiology: Recent Results (from the past 240 hour(s))  CULTURE, BLOOD (ROUTINE X 2)     Status: None   Collection Time    04/18/13  2:18 PM      Result Value Range Status   Specimen Description BLOOD   Final   Special Requests BOTTLES DRAWN AEROBIC AND ANAEROBIC 10CC LEFT EJ   Final   Culture  Setup Time     Final   Value: 04/18/2013 20:50     Performed at [facility]   Culture     Final   Value: NO GROWTH 5 DAYS     Performed at [facility]   Report Status 04/24/2013 FINAL   Final  CULTURE, BLOOD (ROUTINE X 2)     Status: None   Collection Time    04/18/13  2:25 PM      Result Value Range Status   Specimen Description BLOOD HAND LEFT   Final   Special Requests BOTTLES DRAWN AEROBIC ONLY 6CC   Final   Culture  Setup Time     Final   Value: 04/18/2013 20:50     Performed at [facility]   Culture     Final   Value: NO GROWTH 5 DAYS     Performed at [facility]   Report Status 04/24/2013 FINAL   Final  MRSA PCR SCREENING     Status: None   Collection Time    04/18/13  7:59 PM      Result Value Range Status   MRSA by PCR NEGATIVE  NEGATIVE Final   Comment:            The GeneXpert MRSA Assay (FDA     approved for NASAL specimens     only), is one component of a     comprehensive MRSA colonization     surveillance program. It is not     intended to diagnose MRSA     infection nor to guide or     monitor treatment for     MRSA infections.  CULTURE, BLOOD (ROUTINE X 2)     Status: None   Collection Time  04/19/13  3:25 PM      Result Value Range Status   Specimen Description BLOOD HEMODIALYSIS CATHETER   Final   Special Requests BOTTLES DRAWN AEROBIC AND ANAEROBIC 10CC   Final   Culture  Setup Time     Final   Value: 04/20/2013 00:17     Performed at [facility]   Culture     Final   Value: NO GROWTH 5 DAYS      Performed at [facility]   Report Status 04/26/2013 FINAL   Final  CULTURE, BLOOD (ROUTINE X 2)     Status: None   Collection Time    04/19/13  3:45 PM      Result Value Range Status   Specimen Description BLOOD HEMODIALYSIS CATHETER   Final   Special Requests     Final   Value: BOTTLES DRAWN AEROBIC AND ANAEROBIC 10CC PT ON AZITHROMYCIN   Culture  Setup Time     Final   Value: 04/19/2013 21:29     Performed at [facility]   Culture     Final   Value: NO GROWTH 5 DAYS     Performed at [facility]   Report Status 04/25/2013 FINAL   Final  SURGICAL PCR SCREEN     Status: Abnormal   Collection Time    04/24/13 11:29 AM      Result Value Range Status   MRSA, PCR NEGATIVE  NEGATIVE Final   Staphylococcus aureus POSITIVE (*) NEGATIVE Final   Comment:            The Xpert SA Assay (FDA     approved for NASAL specimens     in patients over 61 years of age),     is one component of     a comprehensive surveillance     program.  Test performance has     been validated by [facility] [employer] for patients greater     than or equal to 38 year old.     It is not intended     to diagnose infection nor to     guide or monitor treatment.     Labs: Basic Metabolic Panel:  Recent Labs Lab 04/23/13 0521 04/24/13 0706 04/24/13 1958 04/25/13 0500 04/26/13 0830  NA 136 134* 138 133* 135  K 3.8 4.3 4.6 4.6 4.5  CL 92* 89* 93* 89* 95*  CO2 23 21 18* 17* 21  GLUCOSE 99 92 100* 109* 109*  BUN 47* 77* 87* 95* 47*  CREATININE 4.84* 7.41* 8.54* 8.93* 6.07*  CALCIUM 9.8 9.7 8.9 8.8 8.6  MG  --   --  2.5  --   --   PHOS 7.0* 9.3* 10.5* 10.8* 6.3*   Liver Function Tests:  Recent Labs Lab 04/22/13 0331 04/23/13 0521 04/24/13 0706 04/25/13 0500 04/26/13 0830  ALBUMIN 2.5* 3.5 3.2* 3.0* 3.0*   No results found for this basename: LIPASE, AMYLASE,  in the last 168 hours No results found for this basename: AMMONIA,  in the last 168 hours CBC:  Recent  Labs Lab 04/20/13 0500 04/21/13 0622 04/22/13 0708 04/23/13 0521 04/24/13 0706  WBC 13.5* 12.4* 11.3* 10.6* 10.3  HGB 9.9* 9.8* 10.5* 12.1 11.1*  HCT 29.1* 28.7* 30.8* 36.3 34.7*  MCV 86.1 86.4 88.3 89.6 89.0  PLT 141* 144* 184 209 226   Cardiac Enzymes:  Recent Labs Lab 04/24/13 2000  TROPONINI <0.30   BNP: BNP (last 3  results)  Recent Labs  01/08/13 1042 04/18/13 1055  PROBNP 2385.0* 34669.0*   CBG:  Recent Labs Lab 04/25/13 1211 04/25/13 1835 04/25/13 2258 04/26/13 0803 04/26/13 1147  GLUCAP 185* 93 268* 113* 150*       Signed:  Fransico Sciandra K  [facility] 04/26/2013, 2:00 PM

## 2013-04-26 NOTE — Progress Notes (Signed)
Occupational Therapy Treatment Patient Details Name: Jackie [name] MRN: [id number] DOB: [birth year] Today's Date: 04/26/2013 Time: 4098-1191 OT Time Calculation (min): 40 min  OT Assessment / Plan / Recommendation  History of present illness Pt is a 78 y.o. female with a history of hypertension, diabetes mellitus, chronic kidney disease. She reported generalized weakness and fatigue & was admitted w/ dx: ESRD & anemia.   OT comments  Pt continues to be weak, particularly in LEs with standing and taking steps.  Following visit, pt agreeable to SNF for ST rehab.  Family supports this plan, RN and SW notified.  Follow Up Recommendations  SNF    Barriers to Discharge       Equipment Recommendations  None recommended by OT    Recommendations for Other Services    Frequency Min 2X/week   Progress towards OT Goals Progress towards OT goals: Progressing toward goals  Plan Discharge plan remains appropriate    Precautions / Restrictions Precautions Precautions: Fall Restrictions Weight Bearing Restrictions: No   Pertinent Vitals/Pain VSS, no pain reported    ADL  Eating/Feeding: Independent Where Assessed - Eating/Feeding: Bed level Grooming: Brushing hair;Wash/dry hands;Wash/dry face;Supervision/safety Where Assessed - Grooming: Unsupported sitting Upper Body Bathing: Minimal assistance (for back) Where Assessed - Upper Body Bathing: Unsupported sitting Upper Body Dressing: Minimal assistance Where Assessed - Upper Body Dressing: Unsupported sitting Toileting - Clothing Manipulation and Hygiene: +1 Total assistance Where Assessed - Toileting Clothing Manipulation and Hygiene: Standing Equipment Used: Gait belt Transfers/Ambulation Related to ADLs: stood and took 2 steps to [facility] with mod assist ADL Comments: Pt sat EOB x 15 minutes while participating in ADL.  Daughter in law in room.    OT Diagnosis:    OT Problem List:   OT Treatment Interventions:     OT Goals(current  goals can now be found in the care plan section) Acute Rehab OT Goals Patient Stated Goal: to get stronger   Visit Information  Last OT Received On: 04/26/13 Assistance Needed: +1 History of Present Illness: Pt is a 78 y.o. female with a history of hypertension, diabetes mellitus, chronic kidney disease. She reported generalized weakness and fatigue & was admitted w/ dx: ESRD & anemia.    Subjective Data      Prior Functioning       Cognition  Cognition Arousal/Alertness: Awake/alert Behavior During Therapy: WFL for tasks assessed/performed Overall Cognitive Status: Within Functional Limits for tasks assessed    Mobility  Bed Mobility Bed Mobility: Supine to Sit;Sit to Supine;Sitting - Scoot to Edge of Bed Supine to Sit: HOB flat;With rails;4: Min assist Sitting - Scoot to Edge of Bed: 5: Supervision Sit to Supine: 4: Min assist;HOB flat (for LEs) Transfers Transfers: Sit to Stand;Stand to Sit Sit to Stand: 3: Mod assist;With upper extremity assist;From bed Stand to Sit: 4: Min assist;With upper extremity assist;To bed Details for Transfer Assistance: braces LEs on bed in standing    Exercises      Balance Balance Balance Assessed: Yes Static Sitting Balance Static Sitting - Balance Support: No upper extremity supported;Feet supported Static Sitting - Level of Assistance: 5: Stand by assistance Static Sitting - Comment/# of Minutes: 15 Static Standing Balance Static Standing - Balance Support: Bilateral upper extremity supported;During functional activity Static Standing - Level of Assistance: 3: Mod assist Static Standing - Comment/# of Minutes: 3   End of Session OT - End of Session Activity Tolerance: Patient limited by fatigue Patient left: in bed;in CPM;with family/visitor present Nurse Communication:  (  pt agreeable to SNF)  GO     Evern Bio 04/26/2013, 10:24 AM [phone]

## 2013-04-26 NOTE — Progress Notes (Signed)
Physical Therapy Treatment Patient Details Name: Jackie [name] MRN: [id number] DOB: March 27, 1935 Today's Date: 04/26/2013 Time: 4098-1191 PT Time Calculation (min): 23 min  PT Assessment / Plan / Recommendation  History of Present Illness Pt is a 78 y.o. female with a history of hypertension, diabetes mellitus, chronic kidney disease. She reported generalized weakness and fatigue & was admitted w/ dx: ESRD & anemia.   PT Comments   Pt is progressing towards goals. She was able to transfer bed to chair with mod A, still limited in ability to ambulate by weakness and decreased balance (posterior lean in standing). Continue to recommend SNF for rehab and PT will continue to follow acutely.   Follow Up Recommendations  SNF;Supervision/Assistance - 24 hour     Does the patient have the potential to tolerate intense rehabilitation     Barriers to Discharge        Equipment Recommendations  None recommended by PT    Recommendations for Other Services    Frequency Min 2X/week   Progress towards PT Goals Progress towards PT goals: Progressing toward goals  Plan Current plan remains appropriate    Precautions / Restrictions Precautions Precautions: Fall Restrictions Weight Bearing Restrictions: No   Pertinent Vitals/Pain No pain, no dizziness    Mobility  Bed Mobility Bed Mobility: Rolling Right;Right Sidelying to Sit;Sitting - Scoot to Edge of Bed Rolling Right: 5: Supervision;With rail Right Sidelying to Sit: 4: Min assist Sitting - Scoot to Edge of Bed: 5: Supervision Details for Bed Mobility Assistance: pt continues to need assist to get legs off EOB Transfers Transfers: Sit to Stand;Stand to Sit;Stand Pivot Transfers Sit to Stand: 3: Mod assist;From bed Stand to Sit: 4: Min assist;To chair/3-in-1 Stand Pivot Transfers: 3: Mod assist Details for Transfer Assistance: pt with posterior lean in standing and with SPT. Pt able to step both feet today but with minimal clearance.  Verbal and tactile cues for posture and positioning in front of chair before sitting Ambulation/Gait Ambulation/Gait Assistance: Not tested (comment) Stairs: No Wheelchair Mobility Wheelchair Mobility: No    Exercises Total Joint Exercises Ankle Circles/Pumps: AROM;Both;10 reps;Supine General Exercises - Lower Extremity Long Arc Quad: AROM;20 reps;Both;Seated Hip ABduction/ADduction: AROM;Both;20 reps;Seated Straight Leg Raises: AROM;Both;10 reps;Seated Hip Flexion/Marching: AROM;Both;10 reps;Seated   PT Diagnosis:    PT Problem List:   PT Treatment Interventions:     PT Goals (current goals can now be found in the care plan section) Acute Rehab PT Goals Patient Stated Goal: to get stronger  PT Goal Formulation: With patient Time For Goal Achievement: 05/08/13 Potential to Achieve Goals: Good  Visit Information  Last PT Received On: 04/26/13 Assistance Needed: +1 History of Present Illness: Pt is a 78 y.o. female with a history of hypertension, diabetes mellitus, chronic kidney disease. She reported generalized weakness and fatigue & was admitted w/ dx: ESRD & anemia.    Subjective Data  Subjective: pt feeling stronger than she was Patient Stated Goal: to get stronger    Cognition  Cognition Arousal/Alertness: Awake/alert Behavior During Therapy: WFL for tasks assessed/performed Overall Cognitive Status: Within Functional Limits for tasks assessed    Balance  Balance Balance Assessed: Yes Static Sitting Balance Static Sitting - Balance Support: No upper extremity supported;Feet supported Static Sitting - Level of Assistance: 6: Modified independent (Device/Increase time) Dynamic Standing Balance Dynamic Standing - Balance Support: Bilateral upper extremity supported;During functional activity Dynamic Standing - Level of Assistance: 3: Mod assist  End of Session PT - End of Session Equipment Utilized  During Treatment: Gait belt;Oxygen Activity Tolerance: Patient  tolerated treatment well Patient left: in chair;with call bell/phone within reach Nurse Communication: Mobility status   GP   Lyanne Co, PT  Acute Rehab Services  [phone]   Lyanne Co 04/26/2013, 2:56 PM

## 2013-04-26 NOTE — Clinical Social Work Psychosocial (Signed)
Clinical Social Work Department BRIEF PSYCHOSOCIAL ASSESSMENT 04/26/2013  Patient:  Jackie [name], Jackie [name]     Account Number:  [id number]     Admit date:  04/18/2013  Clinical Social Worker:  Delmer Islam  Date/Time:  04/26/2013 12:17 PM  Referred by:  Physician  Date Referred:  04/26/2013 Referred for  SNF Placement   Other Referral:   Interview type:  Patient Other interview type:   CSW also talked with daughter-in-law Trixie Deis, who was at the bedside.    PSYCHOSOCIAL DATA Living Status:  ALONE Admitted from facility:   Level of care:   Primary support name:  Freida Busman 'Al' Jia Primary support relationship to patient:  CHILD, ADULT Degree of support available:   Patient has another son [place] and daughter-in-law Trixie Deis. Allen's phone number is [phone]    CURRENT CONCERNS Current Concerns  Post-Acute Placement   Other Concerns:    SOCIAL WORK ASSESSMENT / PLAN Patient initially indicated that she would go home, but after being evaluated by OT, patient decided that she would go to a facility for ST rehab. CSW talked with patient and daughter-in-law about ST rehab and SNF list was given. CSW advised of 2 facilities the son did not want. Patient and daughter-in-law informed that CSW will return later with bed offers.   Assessment/plan status:   Other assessment/ plan:   Information/referral to community resources:   Skilled facility list for [facility]    PATIENT'S/FAMILY'S RESPONSE TO PLAN OF CARE: Patient in agreement with ST rehab and family on board with this plan also. Daughter-in-law expressed appreciation of CSW's visit.

## 2013-04-26 NOTE — Clinical Social Work Placement (Addendum)
Clinical Social Work Department CLINICAL SOCIAL WORK PLACEMENT NOTE 04/26/2013  Patient:  Jackie [name], Jackie [name]  Account Number:  [id number] Admit date:  04/18/2013  Clinical Social Worker:  Genelle Bal, LCSW  Date/time:  04/26/2013 12:35 PM  Clinical Social Work is seeking post-discharge placement for this patient at the following level of care:   SKILLED NURSING   (*CSW will update this form in Epic as items are completed)   04/26/2013  Patient/family provided with [facility] Department of Clinical Social Work's list of facilities offering this level of care within the geographic area requested by the patient (or if unable, by the patient's family).  04/26/2013  Patient/family informed of their freedom to choose among providers that offer the needed level of care, that participate in Medicare, Medicaid or managed care program needed by the patient, have an available bed and are willing to accept the patient.    Patient/family informed of [facility]' ownership interest in [facility], as well as of the fact that they are under no obligation to receive care at this facility.  PASARR submitted to EDS on 04/26/2013 PASARR number received from EDS on 04/26/2013 - [id number]  FL2 transmitted to all facilities in geographic area requested by pt/family on  04/26/2013 FL2 transmitted to all facilities within larger geographic area on   Patient informed that his/her managed care company has contracts with or will negotiate with  certain facilities, including the following:     Patient/family informed of bed offers received:   Patient chooses bed at  Physician recommends and patient chooses bed at    Patient to be transferred to  on   Patient to be transferred to facility by   The following physician request were entered in Epic:   Additional Comments:

## 2013-04-26 NOTE — Progress Notes (Signed)
04/26/2013 2:52 PM Hemodialysis Outpatient Note; just notified this patient is being discharged today to [facility], the kidney center is requesting that she arrive for her initial dialysis treatment tomorrow at 11:00 AM. Social Work has been notified and will communicate with [employer]. Thank you. Tilman Neat

## 2013-04-26 NOTE — Progress Notes (Signed)
Report called to [facility]. Belongings gathered and ready for discharge. CSW to arrange for transportation to facility. Post-op dressing was removed from RUA. Two incisions with dermabond and steristrips noted; no drainage present. AVF with +Bruit/thrill.  Pt ready to be discharged. Will monitor.  Kathlene November, Zanasia Hickson [place]

## 2013-04-27 DIAGNOSIS — E1129 Type 2 diabetes mellitus with other diabetic kidney complication: Secondary | ICD-10-CM | POA: Diagnosis not present

## 2013-04-27 DIAGNOSIS — N186 End stage renal disease: Secondary | ICD-10-CM | POA: Diagnosis not present

## 2013-04-27 DIAGNOSIS — D509 Iron deficiency anemia, unspecified: Secondary | ICD-10-CM | POA: Diagnosis not present

## 2013-04-27 DIAGNOSIS — N2581 Secondary hyperparathyroidism of renal origin: Secondary | ICD-10-CM | POA: Diagnosis not present

## 2013-04-27 DIAGNOSIS — D631 Anemia in chronic kidney disease: Secondary | ICD-10-CM | POA: Diagnosis not present

## 2013-04-27 DIAGNOSIS — Z23 Encounter for immunization: Secondary | ICD-10-CM | POA: Diagnosis not present

## 2013-04-28 ENCOUNTER — Encounter (HOSPITAL_COMMUNITY): Payer: Self-pay | Admitting: Vascular Surgery

## 2013-04-29 DIAGNOSIS — D631 Anemia in chronic kidney disease: Secondary | ICD-10-CM | POA: Diagnosis not present

## 2013-04-29 DIAGNOSIS — Z23 Encounter for immunization: Secondary | ICD-10-CM | POA: Diagnosis not present

## 2013-04-29 DIAGNOSIS — N2581 Secondary hyperparathyroidism of renal origin: Secondary | ICD-10-CM | POA: Diagnosis not present

## 2013-04-29 DIAGNOSIS — N186 End stage renal disease: Secondary | ICD-10-CM | POA: Diagnosis not present

## 2013-04-29 DIAGNOSIS — D509 Iron deficiency anemia, unspecified: Secondary | ICD-10-CM | POA: Diagnosis not present

## 2013-04-29 DIAGNOSIS — E1129 Type 2 diabetes mellitus with other diabetic kidney complication: Secondary | ICD-10-CM | POA: Diagnosis not present

## 2013-05-02 DIAGNOSIS — N186 End stage renal disease: Secondary | ICD-10-CM | POA: Diagnosis not present

## 2013-05-02 DIAGNOSIS — E1129 Type 2 diabetes mellitus with other diabetic kidney complication: Secondary | ICD-10-CM | POA: Diagnosis not present

## 2013-05-02 DIAGNOSIS — D509 Iron deficiency anemia, unspecified: Secondary | ICD-10-CM | POA: Diagnosis not present

## 2013-05-02 DIAGNOSIS — Z23 Encounter for immunization: Secondary | ICD-10-CM | POA: Diagnosis not present

## 2013-05-02 DIAGNOSIS — D631 Anemia in chronic kidney disease: Secondary | ICD-10-CM | POA: Diagnosis not present

## 2013-05-02 DIAGNOSIS — N2581 Secondary hyperparathyroidism of renal origin: Secondary | ICD-10-CM | POA: Diagnosis not present

## 2013-05-04 DIAGNOSIS — D631 Anemia in chronic kidney disease: Secondary | ICD-10-CM | POA: Diagnosis not present

## 2013-05-04 DIAGNOSIS — D509 Iron deficiency anemia, unspecified: Secondary | ICD-10-CM | POA: Diagnosis not present

## 2013-05-04 DIAGNOSIS — Z23 Encounter for immunization: Secondary | ICD-10-CM | POA: Diagnosis not present

## 2013-05-04 DIAGNOSIS — N2581 Secondary hyperparathyroidism of renal origin: Secondary | ICD-10-CM | POA: Diagnosis not present

## 2013-05-04 DIAGNOSIS — E1129 Type 2 diabetes mellitus with other diabetic kidney complication: Secondary | ICD-10-CM | POA: Diagnosis not present

## 2013-05-04 DIAGNOSIS — N186 End stage renal disease: Secondary | ICD-10-CM | POA: Diagnosis not present

## 2013-05-05 ENCOUNTER — Encounter: Payer: Self-pay | Admitting: Internal Medicine

## 2013-05-05 ENCOUNTER — Non-Acute Institutional Stay (SKILLED_NURSING_FACILITY): Payer: Medicare Other | Admitting: Internal Medicine

## 2013-05-05 DIAGNOSIS — E119 Type 2 diabetes mellitus without complications: Secondary | ICD-10-CM | POA: Diagnosis not present

## 2013-05-05 DIAGNOSIS — N186 End stage renal disease: Secondary | ICD-10-CM

## 2013-05-05 DIAGNOSIS — D649 Anemia, unspecified: Secondary | ICD-10-CM

## 2013-05-05 DIAGNOSIS — I10 Essential (primary) hypertension: Secondary | ICD-10-CM | POA: Diagnosis not present

## 2013-05-05 NOTE — Progress Notes (Signed)
MRN: [id number] Name: Jackie [name]  Sex: female Age: 78 y.o. DOB: 1935/04/28  PSC #: [email or web address] Facility/Room: 122a Level Of Care: SNF Provider: Merrilee Seashore D Emergency Contacts: Extended Emergency Contact Information Primary Emergency Contact: Pickering,Dan Address: [address]          [place], [place] Home Phone: [phone] Relation: Son Secondary Emergency Contact: [place] Silos [place] Home Phone: [phone] Relation: Son  Code Status: FULL  Allergies: Review of patient's allergies indicates no known allergies.  Chief Complaint  Patient presents with  . nursing home admission    HPI: Patient is 78 y.o. female who has recently been started on hemodialysis and is having problems with anemia.  Past Medical History  Diagnosis Date  . Hypertension   . Diabetes mellitus   . Chronic kidney disease   . Thyroid disease   . Anemia   . Hyperlipidemia   . Arthritis     Past Surgical History  Procedure Laterality Date  . Arteriovenous graft placement Left     non function  . Eye surgery Bilateral     catarct  . Cesarean section    . Av fistula placement Right 01/30/2013    Procedure: ARTERIOVENOUS (AV) FISTULA CREATION;  Surgeon: Fransisco Hertz, MD;  Location: [facility] OR;  Service: Vascular;  Laterality: Right;  . Insertion of dialysis catheter Right 04/24/2013    Procedure: INSERTION OF DIALYSIS CATHETER;  Surgeon: Larina Earthly, MD;  Location: [facility] OR;  Service: Vascular;  Laterality: Right;  . Fistula superficialization Right 04/24/2013    Procedure: FISTULA SUPERFICIALIZATION;  Surgeon: Larina Earthly, MD;  Location: [facility] OR;  Service: Vascular;  Laterality: Right;      Medication List       This list is accurate as of: 05/05/13 11:56 AM.  Always use your most recent med list.               albuterol 108 (90 BASE) MCG/ACT inhaler  Commonly known as:  PROVENTIL HFA;VENTOLIN HFA  Inhale 1-2 puffs into the lungs every 6  (six) hours as needed for wheezing.     ALKA-SELTZER PO  Take 1 tablet by mouth as needed. For indigestion and heartburn     calcitRIOL 0.25 MCG capsule  Commonly known as:  ROCALTROL  Take 0.25 mcg by mouth daily.     FOSRENOL 1000 MG chewable tablet  Generic drug:  lanthanum  Chew 1,000 mg by mouth 3 (three) times daily with meals.     insulin aspart 100 UNIT/ML injection  Commonly known as:  novoLOG  Inject 5 Units into the skin 3 (three) times daily before meals. For CBG > 150     insulin detemir 100 UNIT/ML injection  Commonly known as:  LEVEMIR  Inject 15 Units into the skin at bedtime.     IRON PO  Take 1 tablet by mouth daily.     metoprolol 50 MG tablet  Commonly known as:  LOPRESSOR  Take 50 mg by mouth 2 (two) times daily.     mometasone 50 MCG/ACT nasal spray  Commonly known as:  NASONEX  Place 2 sprays into the nose daily as needed. For nasal congestion     oxyCODONE 5 MG immediate release tablet  Commonly known as:  ROXICODONE  Take 1 tablet (5 mg total) by mouth every 4 (four) hours as needed for pain.     pregabalin 50 MG capsule  Commonly known as:  LYRICA  Take 50 mg by mouth 3 (three) times daily.     TUMS ULTRA PO  Take 1 tablet by mouth daily as needed. For acid reflux        No orders of the defined types were placed in this encounter.     There is no immunization history on file for this patient.  History  Substance Use Topics  . Smoking status: Never Smoker   . Smokeless tobacco: Never Used  . Alcohol Use: No    Family history is noncontributory    Review of Systems  DATA OBTAINED: from patient, nurse, medical record, family member;PT HAS NO C/O GENERAL: Feels well no fevers, fatigue, appetite changes SKIN: No itching, rash or wounds EYES: No eye pain, redness, discharge EARS: No earache, tinnitus, change in hearing NOSE: No congestion, drainage or bleeding  MOUTH/THROAT: No mouth or tooth pain, No sore throat, No difficulty  chewing or swallowing  RESPIRATORY: No cough, wheezing, SOB CARDIAC: No chest pain, palpitations, lower extremity edema  GI: No abdominal pain, No N/V/D or constipation, No heartburn or reflux  GU: No dysuria, frequency or urgency, or incontinence  MUSCULOSKELETAL: No unrelieved bone/joint pain NEUROLOGIC: No headache, dizziness or focal weakness PSYCHIATRIC: No overt anxiety or sadness. Sleeps well. No behavior issue.   Filed Vitals:   05/05/13 1041  BP: 143/73  Pulse: 66  Temp: 97.5 F (36.4 C)  Resp: 20    Physical Exam  GENERAL APPEARANCE: Alert, conversant. Appropriately groomed. No acute distress.  SKIN: No diaphoresis rash, or wounds HEAD: Normocephalic, atraumatic  EYES: Conjunctiva/lids clear. Pupils round, reactive. EOMs intact.  EARS: External exam WNL, canals clear. Hearing grossly normal.  NOSE: No deformity or discharge.  MOUTH/THROAT: Lips w/o lesions.  RESPIRATORY: Breathing is even, unlabored. Lung sounds are clear   CARDIOVASCULAR: Heart RRR no murmurs, rubs or gallops. No peripheral edema.  VENOUS: No varicosities. No venous stasis skin changes  GASTROINTESTINAL: Abdomen is soft, non-tender,MILDLY distended w/ normal bowel sounds.  GENITOURINARY: Bladder non tender, not distended  MUSCULOSKELETAL: No abnormal joints or musculature NEUROLOGIC: Oriented X3. Cranial nerves 2-12 grossly intact. Moves all extremities no tremor. PSYCHIATRIC: Mood and affect appropriate to situation, no behavioral issues  Patient Active Problem List   Diagnosis Date Noted  . Leukocytosis, unspecified 04/22/2013  . HTN (hypertension), benign 04/18/2013  . Diabetes mellitus 04/18/2013  . Syncope 04/18/2013  . Anemia 04/18/2013  . End stage renal disease 09/02/2011    CBC    Component Value Date/Time   WBC 10.3 04/24/2013 0706   RBC 3.90 04/24/2013 0706   RBC 2.03* 04/18/2013 1249   HGB 11.1* 04/24/2013 0706   HCT 34.7* 04/24/2013 0706   PLT 226 04/24/2013 0706   MCV 89.0  04/24/2013 0706   LYMPHSABS 1.6 04/18/2013 1055   MONOABS 1.1* 04/18/2013 1055   EOSABS 0.0 04/18/2013 1055   BASOSABS 0.0 04/18/2013 1055    CMP     Component Value Date/Time   NA 135 04/26/2013 0830   K 4.5 04/26/2013 0830   CL 95* 04/26/2013 0830   CO2 21 04/26/2013 0830   GLUCOSE 109* 04/26/2013 0830   BUN 47* 04/26/2013 0830   CREATININE 6.07* 04/26/2013 0830   CALCIUM 8.6 04/26/2013 0830   CALCIUM 7.8* 11/05/2009 0520   PROT 8.1 01/09/2013 0802   ALBUMIN 3.0* 04/26/2013 0830   AST 13 01/09/2013 0802   ALT 7 01/09/2013 0802   ALKPHOS 68 01/09/2013 0802   BILITOT 0.4 01/09/2013 0802   GFRNONAA 6*  04/26/2013 0830   GFRAA 7* 04/26/2013 0830    Assessment and Plan  Anemia Pt presented to hospital with syncope and a Hb of 4.1 she was transfused with 4 unit PRBC and hemocult was neg;she needed GI work up but before that could happen pt's ARF worsened and pt had placement of dietetic catheter and AV fistula superficialization for dialysis  Just spoke to son here.Pt didn't want GI workup but son does. Per labs 10/15  Hb is 8.3, down from 11.1 on 10/6. Plan redraw on 10/20 and if keeps dropping will plan transfusion on 10/22 Pt has dialysis on Tues 10/21  End stage renal disease Newly on dialysis Tues, Thurs Sat ; tolerating fine so far  HTN (hypertension), benign Controlled with dialysis,  And metoprolol  Diabetes mellitus Controlled on present regimen;HbA1c was 6.4; pt needs something for Chol-LDL 156, HDL  21;will start crestor 10 mg daily; repeat FLP in 4 weeks    Margit Hanks, MD

## 2013-05-05 NOTE — Assessment & Plan Note (Signed)
Controlled with dialysis,  And metoprolol

## 2013-05-05 NOTE — Assessment & Plan Note (Signed)
Newly on dialysis Tues, Thurs Sat ; tolerating fine so far

## 2013-05-05 NOTE — Assessment & Plan Note (Signed)
Pt presented to hospital with syncope and a Hb of 4.1 she was transfused with 4 unit PRBC and hemocult was neg;she needed GI work up but before that could happen pt's ARF worsened and pt had placement of dietetic catheter and AV fistula superficialization for dialysis  Just spoke to son here.Pt didn't want GI workup but son does. Per labs 10/15  Hb is 8.3, down from 11.1 on 10/6. Plan redraw on 10/20 and if keeps dropping will plan transfusion on 10/22 Pt has dialysis on Tues 10/21

## 2013-05-05 NOTE — Assessment & Plan Note (Signed)
Controlled on present regimen;HbA1c was 6.4; pt needs something for Chol-LDL 156, HDL  21;will start crestor 10 mg daily; repeat FLP in 4 weeks

## 2013-05-06 DIAGNOSIS — D631 Anemia in chronic kidney disease: Secondary | ICD-10-CM | POA: Diagnosis not present

## 2013-05-06 DIAGNOSIS — E1129 Type 2 diabetes mellitus with other diabetic kidney complication: Secondary | ICD-10-CM | POA: Diagnosis not present

## 2013-05-06 DIAGNOSIS — N2581 Secondary hyperparathyroidism of renal origin: Secondary | ICD-10-CM | POA: Diagnosis not present

## 2013-05-06 DIAGNOSIS — N186 End stage renal disease: Secondary | ICD-10-CM | POA: Diagnosis not present

## 2013-05-06 DIAGNOSIS — Z23 Encounter for immunization: Secondary | ICD-10-CM | POA: Diagnosis not present

## 2013-05-06 DIAGNOSIS — D509 Iron deficiency anemia, unspecified: Secondary | ICD-10-CM | POA: Diagnosis not present

## 2013-05-08 NOTE — Anesthesia Postprocedure Evaluation (Signed)
Anesthesia Post Note  Patient: Jackie [name]  Procedure(s) Performed: Procedure(s) (LRB): INSERTION OF DIALYSIS CATHETER (Right) FISTULA SUPERFICIALIZATION (Right)  Anesthesia type: General  Patient location: PACU  Post pain: Pain level controlled and Adequate analgesia  Post assessment: Post-op Vital signs reviewed, Patient's Cardiovascular Status Stable, Respiratory Function Stable, Patent Airway and Pain level controlled  Last Vitals:  Filed Vitals:   04/26/13 1422  BP: 113/48  Pulse: 87  Temp: 37.2 C  Resp: 18    Post vital signs: Reviewed and stable  Level of consciousness: awake, alert  and oriented  Complications: No apparent anesthesia complications

## 2013-05-09 DIAGNOSIS — N2581 Secondary hyperparathyroidism of renal origin: Secondary | ICD-10-CM | POA: Diagnosis not present

## 2013-05-09 DIAGNOSIS — N186 End stage renal disease: Secondary | ICD-10-CM | POA: Diagnosis not present

## 2013-05-09 DIAGNOSIS — D509 Iron deficiency anemia, unspecified: Secondary | ICD-10-CM | POA: Diagnosis not present

## 2013-05-09 DIAGNOSIS — D631 Anemia in chronic kidney disease: Secondary | ICD-10-CM | POA: Diagnosis not present

## 2013-05-09 DIAGNOSIS — Z23 Encounter for immunization: Secondary | ICD-10-CM | POA: Diagnosis not present

## 2013-05-09 DIAGNOSIS — E1129 Type 2 diabetes mellitus with other diabetic kidney complication: Secondary | ICD-10-CM | POA: Diagnosis not present

## 2013-05-11 DIAGNOSIS — N186 End stage renal disease: Secondary | ICD-10-CM | POA: Diagnosis not present

## 2013-05-11 DIAGNOSIS — N2581 Secondary hyperparathyroidism of renal origin: Secondary | ICD-10-CM | POA: Diagnosis not present

## 2013-05-11 DIAGNOSIS — Z23 Encounter for immunization: Secondary | ICD-10-CM | POA: Diagnosis not present

## 2013-05-11 DIAGNOSIS — E1129 Type 2 diabetes mellitus with other diabetic kidney complication: Secondary | ICD-10-CM | POA: Diagnosis not present

## 2013-05-11 DIAGNOSIS — D509 Iron deficiency anemia, unspecified: Secondary | ICD-10-CM | POA: Diagnosis not present

## 2013-05-11 DIAGNOSIS — D631 Anemia in chronic kidney disease: Secondary | ICD-10-CM | POA: Diagnosis not present

## 2013-05-13 DIAGNOSIS — N2581 Secondary hyperparathyroidism of renal origin: Secondary | ICD-10-CM | POA: Diagnosis not present

## 2013-05-13 DIAGNOSIS — Z23 Encounter for immunization: Secondary | ICD-10-CM | POA: Diagnosis not present

## 2013-05-13 DIAGNOSIS — N186 End stage renal disease: Secondary | ICD-10-CM | POA: Diagnosis not present

## 2013-05-13 DIAGNOSIS — D631 Anemia in chronic kidney disease: Secondary | ICD-10-CM | POA: Diagnosis not present

## 2013-05-13 DIAGNOSIS — E1129 Type 2 diabetes mellitus with other diabetic kidney complication: Secondary | ICD-10-CM | POA: Diagnosis not present

## 2013-05-13 DIAGNOSIS — D509 Iron deficiency anemia, unspecified: Secondary | ICD-10-CM | POA: Diagnosis not present

## 2013-05-16 DIAGNOSIS — D631 Anemia in chronic kidney disease: Secondary | ICD-10-CM | POA: Diagnosis not present

## 2013-05-16 DIAGNOSIS — Z23 Encounter for immunization: Secondary | ICD-10-CM | POA: Diagnosis not present

## 2013-05-16 DIAGNOSIS — D509 Iron deficiency anemia, unspecified: Secondary | ICD-10-CM | POA: Diagnosis not present

## 2013-05-16 DIAGNOSIS — E1129 Type 2 diabetes mellitus with other diabetic kidney complication: Secondary | ICD-10-CM | POA: Diagnosis not present

## 2013-05-16 DIAGNOSIS — N186 End stage renal disease: Secondary | ICD-10-CM | POA: Diagnosis not present

## 2013-05-16 DIAGNOSIS — N2581 Secondary hyperparathyroidism of renal origin: Secondary | ICD-10-CM | POA: Diagnosis not present

## 2013-05-18 DIAGNOSIS — D509 Iron deficiency anemia, unspecified: Secondary | ICD-10-CM | POA: Diagnosis not present

## 2013-05-18 DIAGNOSIS — D631 Anemia in chronic kidney disease: Secondary | ICD-10-CM | POA: Diagnosis not present

## 2013-05-18 DIAGNOSIS — N186 End stage renal disease: Secondary | ICD-10-CM | POA: Diagnosis not present

## 2013-05-18 DIAGNOSIS — Z23 Encounter for immunization: Secondary | ICD-10-CM | POA: Diagnosis not present

## 2013-05-18 DIAGNOSIS — N2581 Secondary hyperparathyroidism of renal origin: Secondary | ICD-10-CM | POA: Diagnosis not present

## 2013-05-18 DIAGNOSIS — E1129 Type 2 diabetes mellitus with other diabetic kidney complication: Secondary | ICD-10-CM | POA: Diagnosis not present

## 2013-05-19 DIAGNOSIS — N186 End stage renal disease: Secondary | ICD-10-CM | POA: Diagnosis not present

## 2013-05-23 ENCOUNTER — Ambulatory Visit: Payer: Medicare Other | Admitting: Vascular Surgery

## 2013-05-30 ENCOUNTER — Encounter: Payer: Self-pay | Admitting: Internal Medicine

## 2013-05-30 ENCOUNTER — Non-Acute Institutional Stay (SKILLED_NURSING_FACILITY): Payer: Medicare Other | Admitting: Internal Medicine

## 2013-05-30 DIAGNOSIS — E119 Type 2 diabetes mellitus without complications: Secondary | ICD-10-CM

## 2013-05-30 DIAGNOSIS — I10 Essential (primary) hypertension: Secondary | ICD-10-CM

## 2013-05-30 DIAGNOSIS — D649 Anemia, unspecified: Secondary | ICD-10-CM | POA: Diagnosis not present

## 2013-05-30 DIAGNOSIS — N186 End stage renal disease: Secondary | ICD-10-CM

## 2013-05-30 NOTE — Progress Notes (Signed)
MRN: [id number] Name: Jackie [name]  Sex: female Age: 78 y.o. DOB: 09/09/1934  PSC #: Ronni Rumble Facility/Room: Level Of Care: SNF Provider: Merrilee Seashore D Emergency Contacts: Extended Emergency Contact Information Primary Emergency Contact: Menard,Dan Address: [address]          [place], [place] Home Phone: [phone] Relation: Son Secondary Emergency Contact: [place] Home Phone: [phone] Relation: Son  Code Status: FULL  Allergies: Review of patient's allergies indicates no known allergies.  Chief Complaint  Patient presents with  . Discharge Note    HPI: Patient is 78 y.o. female who is being d/c to home.  Past Medical History  Diagnosis Date  . Hypertension   . Diabetes mellitus   . Chronic kidney disease   . Thyroid disease   . Anemia   . Hyperlipidemia   . Arthritis     Past Surgical History  Procedure Laterality Date  . Arteriovenous graft placement Left     non function  . Eye surgery Bilateral     catarct  . Cesarean section    . Av fistula placement Right 01/30/2013    Procedure: ARTERIOVENOUS (AV) FISTULA CREATION;  Surgeon: Fransisco Hertz, MD;  Location: [facility] OR;  Service: Vascular;  Laterality: Right;  . Insertion of dialysis catheter Right 04/24/2013    Procedure: INSERTION OF DIALYSIS CATHETER;  Surgeon: Larina Earthly, MD;  Location: [facility] OR;  Service: Vascular;  Laterality: Right;  . Fistula superficialization Right 04/24/2013    Procedure: FISTULA SUPERFICIALIZATION;  Surgeon: Larina Earthly, MD;  Location: [facility] OR;  Service: Vascular;  Laterality: Right;      Medication List       This list is accurate as of: 05/30/13  7:08 PM.  Always use your most recent med list.               albuterol 108 (90 BASE) MCG/ACT inhaler  Commonly known as:  PROVENTIL HFA;VENTOLIN HFA  Inhale 1-2 puffs into the lungs every 6 (six) hours as needed for wheezing.     ALKA-SELTZER PO  Take 1  tablet by mouth as needed. For indigestion and heartburn     B complex-vitamin C-folic acid 1 MG tablet  Take 1 tablet by mouth daily with breakfast.     calcitRIOL 0.25 MCG capsule  Commonly known as:  ROCALTROL  Take 0.25 mcg by mouth daily.     FOSRENOL 1000 MG chewable tablet  Generic drug:  lanthanum  Chew 1,000 mg by mouth 3 (three) times daily with meals.     insulin aspart 100 UNIT/ML injection  Commonly known as:  novoLOG  Inject 5 Units into the skin 3 (three) times daily before meals. For CBG > 150 sun, Mon, wed, Fri AND 5 units BID every tues, Thurs, Sat     insulin detemir 100 UNIT/ML injection  Commonly known as:  LEVEMIR  Inject 15 Units into the skin at bedtime.     IRON PO  Take 1 tablet by mouth daily.     Melatonin 3 MG Caps  Take 1 capsule by mouth at bedtime as needed.     metoprolol 50 MG tablet  Commonly known as:  LOPRESSOR  Take 50 mg by mouth 2 (two) times daily.     mometasone 50 MCG/ACT nasal spray  Commonly known as:  NASONEX  Place 2 sprays into the nose daily as needed. For nasal congestion     oxyCODONE 5  MG immediate release tablet  Commonly known as:  ROXICODONE  Take 1 tablet (5 mg total) by mouth every 4 (four) hours as needed for pain.     pregabalin 50 MG capsule  Commonly known as:  LYRICA  Take 50 mg by mouth 3 (three) times daily.     rosuvastatin 10 MG tablet  Commonly known as:  CRESTOR  Take 10 mg by mouth daily.     TUMS ULTRA PO  Take 1 tablet by mouth daily as needed. For acid reflux        Meds ordered this encounter  Medications  . rosuvastatin (CRESTOR) 10 MG tablet    Sig: Take 10 mg by mouth daily.  . Melatonin 3 MG CAPS    Sig: Take 1 capsule by mouth at bedtime as needed.  . B Complex-C-Folic Acid (B COMPLEX-VITAMIN C-FOLIC ACID) 1 MG tablet    Sig: Take 1 tablet by mouth daily with breakfast.     There is no immunization history on file for this patient.  History  Substance Use Topics  . Smoking  status: Never Smoker   . Smokeless tobacco: Never Used  . Alcohol Use: No    Filed Vitals:   05/30/13 1606  BP: 148/76  Pulse: 62  Temp: 97.7 F (36.5 C)  Resp: 20    Physical Exam  GENERAL APPEARANCE: Alert, conversant. Appropriately groomed. No acute distress.  HEENT: Unremarkable. RESPIRATORY: Breathing is even, unlabored. Lung sounds are clear   CARDIOVASCULAR: Heart RRR no murmurs, rubs or gallops. No peripheral edema.  GASTROINTESTINAL: Abdomen is soft, non-tender, not distended w/ normal bowel sounds.  NEUROLOGIC: Cranial nerves 2-12 grossly intact. Moves all extremities no tremor.  Patient Active Problem List   Diagnosis Date Noted  . Leukocytosis, unspecified 04/22/2013  . HTN (hypertension), benign 04/18/2013  . Diabetes mellitus 04/18/2013  . Syncope 04/18/2013  . Anemia 04/18/2013  . End stage renal disease 09/02/2011    CBC    Component Value Date/Time   WBC 10.3 04/24/2013 0706   RBC 3.90 04/24/2013 0706   RBC 2.03* 04/18/2013 1249   HGB 11.1* 04/24/2013 0706   HCT 34.7* 04/24/2013 0706   PLT 226 04/24/2013 0706   MCV 89.0 04/24/2013 0706   LYMPHSABS 1.6 04/18/2013 1055   MONOABS 1.1* 04/18/2013 1055   EOSABS 0.0 04/18/2013 1055   BASOSABS 0.0 04/18/2013 1055    CMP     Component Value Date/Time   NA 135 04/26/2013 0830   K 4.5 04/26/2013 0830   CL 95* 04/26/2013 0830   CO2 21 04/26/2013 0830   GLUCOSE 109* 04/26/2013 0830   BUN 47* 04/26/2013 0830   CREATININE 6.07* 04/26/2013 0830   CALCIUM 8.6 04/26/2013 0830   CALCIUM 7.8* 11/05/2009 0520   PROT 8.1 01/09/2013 0802   ALBUMIN 3.0* 04/26/2013 0830   AST 13 01/09/2013 0802   ALT 7 01/09/2013 0802   ALKPHOS 68 01/09/2013 0802   BILITOT 0.4 01/09/2013 0802   GFRNONAA 6* 04/26/2013 0830   GFRAA 7* 04/26/2013 0830    Assessment and Plan  Patient has been going to dialysis regularly, is improved ans stable for discharge to home.  Margit Hanks, MD

## 2013-06-05 DIAGNOSIS — D638 Anemia in other chronic diseases classified elsewhere: Secondary | ICD-10-CM | POA: Diagnosis not present

## 2013-06-05 DIAGNOSIS — Z5189 Encounter for other specified aftercare: Secondary | ICD-10-CM | POA: Diagnosis not present

## 2013-06-05 DIAGNOSIS — N189 Chronic kidney disease, unspecified: Secondary | ICD-10-CM | POA: Diagnosis not present

## 2013-06-05 DIAGNOSIS — I10 Essential (primary) hypertension: Secondary | ICD-10-CM | POA: Diagnosis not present

## 2013-06-05 DIAGNOSIS — E1165 Type 2 diabetes mellitus with hyperglycemia: Secondary | ICD-10-CM

## 2013-06-05 DIAGNOSIS — E1129 Type 2 diabetes mellitus with other diabetic kidney complication: Secondary | ICD-10-CM

## 2013-06-05 DIAGNOSIS — Z992 Dependence on renal dialysis: Secondary | ICD-10-CM | POA: Diagnosis not present

## 2013-06-05 DIAGNOSIS — Z794 Long term (current) use of insulin: Secondary | ICD-10-CM | POA: Diagnosis not present

## 2013-06-09 DIAGNOSIS — N189 Chronic kidney disease, unspecified: Secondary | ICD-10-CM | POA: Diagnosis not present

## 2013-06-09 DIAGNOSIS — D638 Anemia in other chronic diseases classified elsewhere: Secondary | ICD-10-CM | POA: Diagnosis not present

## 2013-06-09 DIAGNOSIS — Z992 Dependence on renal dialysis: Secondary | ICD-10-CM | POA: Diagnosis not present

## 2013-06-09 DIAGNOSIS — Z5189 Encounter for other specified aftercare: Secondary | ICD-10-CM | POA: Diagnosis not present

## 2013-06-09 DIAGNOSIS — E1129 Type 2 diabetes mellitus with other diabetic kidney complication: Secondary | ICD-10-CM | POA: Diagnosis not present

## 2013-06-09 DIAGNOSIS — I10 Essential (primary) hypertension: Secondary | ICD-10-CM | POA: Diagnosis not present

## 2013-06-12 DIAGNOSIS — Z992 Dependence on renal dialysis: Secondary | ICD-10-CM | POA: Diagnosis not present

## 2013-06-12 DIAGNOSIS — E1129 Type 2 diabetes mellitus with other diabetic kidney complication: Secondary | ICD-10-CM | POA: Diagnosis not present

## 2013-06-12 DIAGNOSIS — N189 Chronic kidney disease, unspecified: Secondary | ICD-10-CM | POA: Diagnosis not present

## 2013-06-12 DIAGNOSIS — I10 Essential (primary) hypertension: Secondary | ICD-10-CM | POA: Diagnosis not present

## 2013-06-12 DIAGNOSIS — D638 Anemia in other chronic diseases classified elsewhere: Secondary | ICD-10-CM | POA: Diagnosis not present

## 2013-06-12 DIAGNOSIS — Z5189 Encounter for other specified aftercare: Secondary | ICD-10-CM | POA: Diagnosis not present

## 2013-06-14 DIAGNOSIS — N189 Chronic kidney disease, unspecified: Secondary | ICD-10-CM | POA: Diagnosis not present

## 2013-06-14 DIAGNOSIS — D638 Anemia in other chronic diseases classified elsewhere: Secondary | ICD-10-CM | POA: Diagnosis not present

## 2013-06-14 DIAGNOSIS — E1129 Type 2 diabetes mellitus with other diabetic kidney complication: Secondary | ICD-10-CM | POA: Diagnosis not present

## 2013-06-14 DIAGNOSIS — Z992 Dependence on renal dialysis: Secondary | ICD-10-CM | POA: Diagnosis not present

## 2013-06-14 DIAGNOSIS — Z5189 Encounter for other specified aftercare: Secondary | ICD-10-CM | POA: Diagnosis not present

## 2013-06-14 DIAGNOSIS — I10 Essential (primary) hypertension: Secondary | ICD-10-CM | POA: Diagnosis not present

## 2013-06-16 DIAGNOSIS — E1129 Type 2 diabetes mellitus with other diabetic kidney complication: Secondary | ICD-10-CM | POA: Diagnosis not present

## 2013-06-16 DIAGNOSIS — D638 Anemia in other chronic diseases classified elsewhere: Secondary | ICD-10-CM | POA: Diagnosis not present

## 2013-06-16 DIAGNOSIS — Z5189 Encounter for other specified aftercare: Secondary | ICD-10-CM | POA: Diagnosis not present

## 2013-06-16 DIAGNOSIS — I10 Essential (primary) hypertension: Secondary | ICD-10-CM | POA: Diagnosis not present

## 2013-06-16 DIAGNOSIS — N189 Chronic kidney disease, unspecified: Secondary | ICD-10-CM | POA: Diagnosis not present

## 2013-06-16 DIAGNOSIS — Z992 Dependence on renal dialysis: Secondary | ICD-10-CM | POA: Diagnosis not present

## 2013-06-18 ENCOUNTER — Emergency Department (HOSPITAL_COMMUNITY)
Admission: EM | Admit: 2013-06-18 | Discharge: 2013-06-18 | Disposition: A | Discharge status: Home / Self Care | Payer: Medicare Other | Attending: Emergency Medicine | Admitting: Emergency Medicine

## 2013-06-18 ENCOUNTER — Encounter (HOSPITAL_COMMUNITY): Payer: Self-pay | Admitting: Emergency Medicine

## 2013-06-18 DIAGNOSIS — E119 Type 2 diabetes mellitus without complications: Secondary | ICD-10-CM | POA: Diagnosis not present

## 2013-06-18 DIAGNOSIS — Z992 Dependence on renal dialysis: Secondary | ICD-10-CM | POA: Insufficient documentation

## 2013-06-18 DIAGNOSIS — E785 Hyperlipidemia, unspecified: Secondary | ICD-10-CM | POA: Diagnosis not present

## 2013-06-18 DIAGNOSIS — Z862 Personal history of diseases of the blood and blood-forming organs and certain disorders involving the immune mechanism: Secondary | ICD-10-CM | POA: Insufficient documentation

## 2013-06-18 DIAGNOSIS — N186 End stage renal disease: Secondary | ICD-10-CM | POA: Diagnosis not present

## 2013-06-18 DIAGNOSIS — H10023 Other mucopurulent conjunctivitis, bilateral: Secondary | ICD-10-CM

## 2013-06-18 DIAGNOSIS — M129 Arthropathy, unspecified: Secondary | ICD-10-CM | POA: Diagnosis not present

## 2013-06-18 DIAGNOSIS — I120 Hypertensive chronic kidney disease with stage 5 chronic kidney disease or end stage renal disease: Secondary | ICD-10-CM | POA: Insufficient documentation

## 2013-06-18 DIAGNOSIS — Z9889 Other specified postprocedural states: Secondary | ICD-10-CM | POA: Insufficient documentation

## 2013-06-18 DIAGNOSIS — H1030 Unspecified acute conjunctivitis, unspecified eye: Secondary | ICD-10-CM | POA: Insufficient documentation

## 2013-06-18 DIAGNOSIS — Z794 Long term (current) use of insulin: Secondary | ICD-10-CM | POA: Diagnosis not present

## 2013-06-18 DIAGNOSIS — Z79899 Other long term (current) drug therapy: Secondary | ICD-10-CM | POA: Diagnosis not present

## 2013-06-18 MED ORDER — POLYMYXIN B-TRIMETHOPRIM 10000-0.1 UNIT/ML-% OP SOLN: 1.0000 [drp] | OPHTHALMIC | Status: AC

## 2013-06-18 MED ORDER — ERYTHROMYCIN 5 MG/GM OP OINT: TOPICAL_OINTMENT | Freq: Every day | OPHTHALMIC | Status: AC

## 2013-06-18 NOTE — ED Notes (Signed)
PA at bedside.

## 2013-06-18 NOTE — ED Notes (Signed)
Pt reports redness to bilateral eyes since last night. Denies pain, itching or other complaints. Tried cold compresses with no relief

## 2013-06-18 NOTE — ED Provider Notes (Signed)
CSN: [id number]     Arrival date & time 06/18/13  1227 History   First MD Initiated Contact with Patient 06/18/13 1238     Chief Complaint  Patient presents with  . Eye Problem   (Consider location/radiation/quality/duration/timing/severity/associated sxs/prior Treatment) HPI  78 yo female with PMH significant for HTN, Diabetes Mellitus, CKD, and Cataract surgery (RIGHT eye) presents with bilateral eye swelling/redness x 1 day. Patient states it started yesterday evening shortly after she returned home from Dialysis. Patient denies using contact lenses. Denies pain. Admits to itching and eye discharge. Denies hx of similar symptoms. Denies blurred vision, diplopia, or visual changes. Denies Fever/chills. Patient has tried cold compresses with no relief, and symptoms seem to be worsening.    Past Medical History  Diagnosis Date  . Hypertension   . Diabetes mellitus   . Chronic kidney disease   . Thyroid disease   . Anemia   . Hyperlipidemia   . Arthritis    Past Surgical History  Procedure Laterality Date  . Arteriovenous graft placement Left     non function  . Eye surgery Bilateral     catarct  . Cesarean section    . Av fistula placement Right 01/30/2013    Procedure: ARTERIOVENOUS (AV) FISTULA CREATION;  Surgeon: Fransisco Hertz, MD;  Location: [facility] OR;  Service: Vascular;  Laterality: Right;  . Insertion of dialysis catheter Right 04/24/2013    Procedure: INSERTION OF DIALYSIS CATHETER;  Surgeon: Larina Earthly, MD;  Location: [facility] OR;  Service: Vascular;  Laterality: Right;  . Fistula superficialization Right 04/24/2013    Procedure: FISTULA SUPERFICIALIZATION;  Surgeon: Larina Earthly, MD;  Location: [facility] OR;  Service: Vascular;  Laterality: Right;   Family History  Problem Relation Age of Onset  . Diabetes Mother   . Heart disease Mother   . Hypertension Mother    History  Substance Use Topics  . Smoking status: Never Smoker   . Smokeless tobacco: Never Used  . Alcohol Use: No     OB History   Grav Para Term Preterm Abortions TAB SAB Ect Mult Living                 Review of Systems  Constitutional: Negative for fever and chills.  HENT: Negative for congestion, ear pain, rhinorrhea, sinus pressure and sore throat.   Eyes: Positive for discharge, redness and itching. Negative for pain and visual disturbance.  Respiratory: Negative for shortness of breath.   Cardiovascular: Negative for chest pain.  Musculoskeletal: Negative for neck pain and neck stiffness.  Skin: Negative for rash.    Allergies  Review of patient's allergies indicates no known allergies.  Home Medications   Current Outpatient Rx  Name  Route  Sig  Dispense  Refill  . albuterol (PROVENTIL HFA;VENTOLIN HFA) 108 (90 BASE) MCG/ACT inhaler   Inhalation   Inhale 1-2 puffs into the lungs every 6 (six) hours as needed for wheezing.   1 Inhaler   0   . Aspirin Effervescent (ALKA-SELTZER PO)   Oral   Take 1 tablet by mouth as needed. For indigestion and heartburn         . B Complex-C-Folic Acid (B COMPLEX-VITAMIN C-FOLIC ACID) 1 MG tablet   Oral   Take 1 tablet by mouth daily with breakfast.         . calcitRIOL (ROCALTROL) 0.25 MCG capsule   Oral   Take 0.25 mcg by mouth daily.         Marland Kitchen  Calcium Carbonate Antacid (TUMS ULTRA PO)   Oral   Take 1 tablet by mouth daily as needed. For acid reflux         . FOSRENOL 1000 MG chewable tablet   Oral   Chew 1,000 mg by mouth 3 (three) times daily with meals.          . insulin aspart (NOVOLOG) 100 UNIT/ML injection   Subcutaneous   Inject 5 Units into the skin daily as needed for high blood sugar.          . insulin detemir (LEVEMIR) 100 UNIT/ML injection   Subcutaneous   Inject 15 Units into the skin at bedtime.         . IRON PO   Oral   Take 1 tablet by mouth daily.         . Melatonin 3 MG CAPS   Oral   Take 1 capsule by mouth at bedtime as needed (sleep).          . metoprolol (LOPRESSOR) 50 MG tablet    Oral   Take 50 mg by mouth 2 (two) times daily.         . mometasone (NASONEX) 50 MCG/ACT nasal spray   Nasal   Place 2 sprays into the nose daily as needed. For nasal congestion         . oxyCODONE (ROXICODONE) 5 MG immediate release tablet   Oral   Take 1 tablet (5 mg total) by mouth every 4 (four) hours as needed for pain.   50 tablet   0   . pregabalin (LYRICA) 50 MG capsule   Oral   Take 50 mg by mouth 3 (three) times daily.          . rosuvastatin (CRESTOR) 10 MG tablet   Oral   Take 10 mg by mouth daily.         Marland Kitchen erythromycin ophthalmic ointment   Both Eyes   Place into both eyes at bedtime. Place a 1/2 inch ribbon of ointment into the lower eyelid.   3.5 g   0   . trimethoprim-polymyxin b (POLYTRIM) ophthalmic solution   Both Eyes   Place 1 drop into both eyes every 4 (four) hours.   10 mL   0     Apply drops every 4 hours x 10 days.    BP 153/71  Pulse 66  Temp(Src) 98 F (36.7 C) (Oral)  Resp 16  SpO2 95% Physical Exam  Vitals reviewed. Constitutional: She is oriented to person, place, and time. She appears well-developed and well-nourished. No distress.  HENT:  Head: Normocephalic and atraumatic.  Mouth/Throat: No oropharyngeal exudate.  Eyes: EOM are normal. Pupils are equal, round, and reactive to light. Right eye exhibits discharge. Right eye exhibits no hordeolum. Left eye exhibits discharge. Left eye exhibits no hordeolum. Right conjunctiva is injected. Left conjunctiva is injected.    Bilateral conjunctival injection with LEFT worse than RIGHT. White/yellow purulent discharge bilaterally. No crusting noted. LEFT lower lid mildly edematous. No pain with eye movement bilaterally. No restricted movement of eyes bilaterally. No erythema of periorbital tissue. No Tenderness of periorbital area.   Unable to perform adequate funduscopic exam.   Cardiovascular: Normal rate and regular rhythm.   Pulmonary/Chest: Effort normal and breath sounds  normal.  Neurological: She is alert and oriented to person, place, and time.  Skin: Skin is warm and dry. No rash noted.  Psychiatric: She has a normal mood and affect. Her  behavior is normal.    ED Course  Procedures (including critical care time) Labs Review Labs Reviewed - No data to display Imaging Review No results found.  EKG Interpretation   None       MDM   1. Acute bacterial conjunctivitis, bilateral    Patient presents with bilateral eye swelling and redness x 1 day w/ Purulent drainage consistent with Bacterial Conjunctivitis. Patient is afebrile with no eye pain noted. Patient given rx for Antibiotic drops to take during day and antibiotic ointment to apply at night. Discussed use of drops and ointment. Advised use x 10 days.  Discussed follow up with Opthamology. Given contact info for on call opthamologist, Dr. Randon Goldsmith. Patient advised to call Opthamology tomorrow to make appointment. Patient agrees with plan and discharged in good condition.     Rudene Anda, PA-C 06/18/13 2238

## 2013-06-19 NOTE — ED Provider Notes (Signed)
Medical screening examination/treatment/procedure(s) were conducted as a shared visit with non-physician practitioner(s) and myself.  I personally evaluated the patient during the encounter.  EKG Interpretation   None       Pt with evidence of bilateral conjunctivits without vision changes or lid abnormalities.  Pt has no signs of periorbital cellulitis.  Treated with polymixin gtt and erythromycin ointment  Gwyneth Sprout, MD 06/19/13 [phone]

## 2013-06-20 DIAGNOSIS — N2581 Secondary hyperparathyroidism of renal origin: Secondary | ICD-10-CM | POA: Diagnosis not present

## 2013-06-20 DIAGNOSIS — Z23 Encounter for immunization: Secondary | ICD-10-CM | POA: Diagnosis not present

## 2013-06-20 DIAGNOSIS — E1129 Type 2 diabetes mellitus with other diabetic kidney complication: Secondary | ICD-10-CM | POA: Diagnosis not present

## 2013-06-20 DIAGNOSIS — D631 Anemia in chronic kidney disease: Secondary | ICD-10-CM | POA: Diagnosis not present

## 2013-06-20 DIAGNOSIS — D509 Iron deficiency anemia, unspecified: Secondary | ICD-10-CM | POA: Diagnosis not present

## 2013-06-20 DIAGNOSIS — N186 End stage renal disease: Secondary | ICD-10-CM | POA: Diagnosis not present

## 2013-06-20 DIAGNOSIS — T82898A Other specified complication of vascular prosthetic devices, implants and grafts, initial encounter: Secondary | ICD-10-CM | POA: Diagnosis not present

## 2013-06-28 DIAGNOSIS — E1129 Type 2 diabetes mellitus with other diabetic kidney complication: Secondary | ICD-10-CM | POA: Diagnosis not present

## 2013-06-28 DIAGNOSIS — Z5189 Encounter for other specified aftercare: Secondary | ICD-10-CM | POA: Diagnosis not present

## 2013-06-28 DIAGNOSIS — D638 Anemia in other chronic diseases classified elsewhere: Secondary | ICD-10-CM | POA: Diagnosis not present

## 2013-06-28 DIAGNOSIS — I10 Essential (primary) hypertension: Secondary | ICD-10-CM | POA: Diagnosis not present

## 2013-06-28 DIAGNOSIS — Z992 Dependence on renal dialysis: Secondary | ICD-10-CM | POA: Diagnosis not present

## 2013-06-28 DIAGNOSIS — N189 Chronic kidney disease, unspecified: Secondary | ICD-10-CM | POA: Diagnosis not present

## 2013-06-30 DIAGNOSIS — Z5189 Encounter for other specified aftercare: Secondary | ICD-10-CM | POA: Diagnosis not present

## 2013-06-30 DIAGNOSIS — I10 Essential (primary) hypertension: Secondary | ICD-10-CM | POA: Diagnosis not present

## 2013-06-30 DIAGNOSIS — N189 Chronic kidney disease, unspecified: Secondary | ICD-10-CM | POA: Diagnosis not present

## 2013-06-30 DIAGNOSIS — E1129 Type 2 diabetes mellitus with other diabetic kidney complication: Secondary | ICD-10-CM | POA: Diagnosis not present

## 2013-06-30 DIAGNOSIS — D638 Anemia in other chronic diseases classified elsewhere: Secondary | ICD-10-CM | POA: Diagnosis not present

## 2013-06-30 DIAGNOSIS — Z992 Dependence on renal dialysis: Secondary | ICD-10-CM | POA: Diagnosis not present

## 2013-07-03 ENCOUNTER — Encounter: Payer: Self-pay | Admitting: Vascular Surgery

## 2013-07-04 ENCOUNTER — Ambulatory Visit (INDEPENDENT_AMBULATORY_CARE_PROVIDER_SITE_OTHER): Payer: Self-pay | Admitting: Vascular Surgery

## 2013-07-04 ENCOUNTER — Encounter: Payer: Self-pay | Admitting: Vascular Surgery

## 2013-07-04 VITALS — BP 190/79 | HR 60 | Ht 69.0 in | Wt 138.8 lb

## 2013-07-04 DIAGNOSIS — N186 End stage renal disease: Secondary | ICD-10-CM

## 2013-07-04 NOTE — Progress Notes (Signed)
Patient has today for followup of her official mobilization of her cephalic vein upper arm fistula by myself on 04/24/2013. She is using her hemodialysis catheter reports no difficulty with this.  BP 190/79  Pulse 60  Ht 5' 9" (1.753 m)  Wt 138 lb 12.8 oz (62.959 kg)  BMI 20.49 kg/m2  On physical exam her incisions ever upper arm are well healed. She has a good dilatation of her cephalic vein near the antecubital space with the pulsatile nature. The vein in the upper arm I does dilate with proximal compression but has minimal flow without compressing more proximally. I by physical exam there is a distinct thrill in the area above the dilated vein above the antecubital space.  End stage renal disease with slowly maturing right arm AV fistula. She is status post ligation of competing branches in mobilization on 04/24/2013. I have recommended shuntogram and possible angioplasty as an outpatient hospital. We will do this on nondialysis day she prefers until after the first year. We have scheduled her for 07/26/2013 at [place] 

## 2013-07-07 ENCOUNTER — Other Ambulatory Visit: Payer: Self-pay

## 2013-07-18 ENCOUNTER — Encounter (HOSPITAL_COMMUNITY): Payer: Self-pay | Admitting: Pharmacist

## 2013-07-19 DIAGNOSIS — N186 End stage renal disease: Secondary | ICD-10-CM | POA: Diagnosis not present

## 2013-07-22 DIAGNOSIS — N186 End stage renal disease: Secondary | ICD-10-CM | POA: Diagnosis not present

## 2013-07-22 DIAGNOSIS — Z23 Encounter for immunization: Secondary | ICD-10-CM | POA: Diagnosis not present

## 2013-07-22 DIAGNOSIS — E1129 Type 2 diabetes mellitus with other diabetic kidney complication: Secondary | ICD-10-CM | POA: Diagnosis not present

## 2013-07-22 DIAGNOSIS — D631 Anemia in chronic kidney disease: Secondary | ICD-10-CM | POA: Diagnosis not present

## 2013-07-22 DIAGNOSIS — N2581 Secondary hyperparathyroidism of renal origin: Secondary | ICD-10-CM | POA: Diagnosis not present

## 2013-07-22 DIAGNOSIS — D509 Iron deficiency anemia, unspecified: Secondary | ICD-10-CM | POA: Diagnosis not present

## 2013-07-26 ENCOUNTER — Encounter (HOSPITAL_COMMUNITY)
Admission: RE | Disposition: A | Discharge status: Home / Self Care | Payer: Self-pay | Source: Ambulatory Visit | Attending: Vascular Surgery

## 2013-07-26 ENCOUNTER — Ambulatory Visit (HOSPITAL_COMMUNITY)
Admission: RE | Admit: 2013-07-26 | Discharge: 2013-07-26 | Disposition: A | Discharge status: Home / Self Care | Payer: Medicare Other | Source: Ambulatory Visit | Attending: Vascular Surgery | Admitting: Vascular Surgery

## 2013-07-26 ENCOUNTER — Telehealth: Payer: Self-pay | Admitting: Vascular Surgery

## 2013-07-26 DIAGNOSIS — N186 End stage renal disease: Secondary | ICD-10-CM | POA: Insufficient documentation

## 2013-07-26 DIAGNOSIS — I871 Compression of vein: Secondary | ICD-10-CM | POA: Diagnosis not present

## 2013-07-26 DIAGNOSIS — T82898A Other specified complication of vascular prosthetic devices, implants and grafts, initial encounter: Secondary | ICD-10-CM | POA: Diagnosis not present

## 2013-07-26 DIAGNOSIS — Y832 Surgical operation with anastomosis, bypass or graft as the cause of abnormal reaction of the patient, or of later complication, without mention of misadventure at the time of the procedure: Secondary | ICD-10-CM | POA: Insufficient documentation

## 2013-07-26 HISTORY — PX: FISTULOGRAM: SHX5832

## 2013-07-26 LAB — POCT I-STAT, CHEM 8
BUN: 15 mg/dL (ref 6–23)
CALCIUM ION: 1.14 mmol/L (ref 1.13–1.30)
CHLORIDE: 100 meq/L (ref 96–112)
Creatinine, Ser: 3.7 mg/dL — ABNORMAL HIGH (ref 0.50–1.10)
Glucose, Bld: 105 mg/dL — ABNORMAL HIGH (ref 70–99)
HCT: 40 % (ref 36.0–46.0)
Hemoglobin: 13.6 g/dL (ref 12.0–15.0)
Potassium: 3.9 mEq/L (ref 3.7–5.3)
SODIUM: 140 meq/L (ref 137–147)
TCO2: 32 mmol/L (ref 0–100)

## 2013-07-26 LAB — GLUCOSE, CAPILLARY: Glucose-Capillary: 91 mg/dL (ref 70–99)

## 2013-07-26 SURGERY — FISTULOGRAM
Anesthesia: LOCAL | Laterality: Right

## 2013-07-26 MED ORDER — LIDOCAINE HCL (PF) 1 % IJ SOLN
INTRAMUSCULAR | Status: AC
  Filled 2013-07-26: qty 30

## 2013-07-26 MED ORDER — ACETAMINOPHEN 325 MG PO TABS: 650.0000 mg | ORAL_TABLET | ORAL | Status: DC | PRN

## 2013-07-26 MED ORDER — HEPARIN (PORCINE) IN NACL 2-0.9 UNIT/ML-% IJ SOLN
INTRAMUSCULAR | Status: AC
  Filled 2013-07-26: qty 500

## 2013-07-26 MED ORDER — SODIUM CHLORIDE 0.9 % IJ SOLN: 3.0000 mL | INTRAMUSCULAR | Status: DC | PRN

## 2013-07-26 NOTE — Interval H&P Note (Signed)
History and Physical Interval Note:  07/26/2013 8:17 AM  Jackie [name]  has presented today for surgery, with the diagnosis of End stage renal  The various methods of treatment have been discussed with the patient and family. After consideration of risks, benefits and other options for treatment, the patient has consented to  Procedure(s): FISTULOGRAM (Right) as a surgical intervention .  The patient's history has been reviewed, patient examined, no change in status, stable for surgery.  I have reviewed the patient's chart and labs.  Questions were answered to the patient's satisfaction.     Ladarrius Bogdanski

## 2013-07-26 NOTE — Op Note (Signed)
    OPERATIVE REPORT  DATE OF SURGERY: 07/26/2013  PATIENT: Jackie [name], 79 y.o. female MRN: [id number]  DOB: 07/29/1934  PRE-OPERATIVE DIAGNOSIS: End-stage renal disease with poorly functioning right upper arm AV fistula  POST-OPERATIVE DIAGNOSIS:  Same  PROCEDURE: #1 right upper arm fistulogram, #2 angioplasty of arteriovenous anastomosis and in the vein stenosis  SURGEON:  Gretta Beganodd Early, M.D.  PHYSICIAN ASSISTANT: Nurse  ANESTHESIA:  1% lidocaine  EBL: Minimal ml     BLOOD ADMINISTERED: None  DRAINS: None  SPECIMEN: None  COUNTS CORRECT:  YES  PLAN OF CARE: Short stay   PATIENT DISPOSITION:  PACU - hemodynamically stable  PROCEDURE DETAILS: The patient was taken to the operating placed supine position where the area of the right arm was prepped and draped in usual sterile fashion. Patient pulsatile nature of the proximal segment of her fistula at the antecubital space and a much lessor flow more distally. Using lidocaine local a micropuncture technique was used to access the fistula with the catheter placed toward the antecubital space. Fistulogram showed good size maturation of the mid arm fistula and no evidence of central stenosis. The outflow vein was manually occluded tissue the anastomosis and the more proximal fistula with reflux. This showed a tight stenosis at the anastomosis and also a proximally 3 cm segment of stenosis in the vein above the antecubital space. The micropuncture sheath was exchanged for a 6 French sheath. A 4 mm x 2 cm Powerflex balloon was placed over the 014 wire crosses area of stenosis at the antecubital space and this was dilated. This was also dilated the area of stenosis in the vein. Repeat fistulogram showed resistant stenosis at the anastomosis and some improvement in the vein. This reason a 6 mm x 40 mm Paraflex balloon was exchanged and this was dilated and the vein itself and at the venous anastomosis. Repeat arteriogram showed excellent  result in the vein and marked improvement of the anastomosis. A 3-0 nylon pursestring suture was placed around the 6 French sheath and this was pulled and the suture was tied. The patient was taken to the holding area with no immediate complication. Should be able to use the fistula in one week   Gretta Beganodd Early, M.D. 07/26/2013 9:32 AM

## 2013-07-26 NOTE — Telephone Encounter (Signed)
Message copied by Fredrich BirksMILLIKAN, DANA P on Wed Jul 26, 2013  3:03 PM ------      Message from: Melene PlanKECK, JUDY J      Created: Wed Jul 26, 2013 10:13 AM       FYI NO FOLLOW UP UNLESS PROBLEMS AGAIN       ----- Message -----         From: Larina Earthlyodd F Early, MD         Sent: 07/26/2013   9:36 AM           To: Reuel DerbySusan H Large, Melene PlanJudy J Keck, RN            Had right upper arm fistulogram, had angioplasty of AV anastomosis and also angioplasty of stenosis in the vein of the fistula.       ------

## 2013-07-26 NOTE — Discharge Instructions (Signed)
°  HOME CARE INSTRUCTIONS   Do not drink alcohol.  Only take medicine that has been prescribed by your caregiver.  Do not sign important papers or make important decisions.  Have a responsible person with you.  Ask your caregiver to show you how to check your access at home for a vibration (called a "thrill") or for a sound (called a "bruit" pronounced brew-ee).  Your vein will need time to enlarge and mature so needles can be inserted for dialysis. Follow your caregiver's instructions about what you need to do to make this happen.  Keep dressings clean and dry.  Keep the arm elevated above your heart. Use a pillow.  Rest.  Use the arm as usual for all activities.  Have the stitches or tape closures removed in 10 to 14 days, or as directed by your caregiver.  Do not sleep or lie on the area of the fistula or that arm. This may decrease or stop the blood flow through your fistula.  Do not allow blood pressures to be taken on this arm.  Do not allow blood drawing to be done from the graft.  Do not wear tight clothing around the access site or on the arm.  Avoid lifting heavy objects with the arm that has the fistula.  Do not use creams or lotions over the access site. SEEK MEDICAL CARE IF:   You have a fever.  You have swelling around the fistula that gets worse, or you have new pain.  You have unusual bleeding at the fistula site or from any other area.  You have pus or other drainage at the fistula site.  You have skin redness or red streaking on the skin around, above, or below the fistula site.  Your access site feels warm.  You have any flu-like symptoms. SEEK IMMEDIATE MEDICAL CARE IF:   You have pain, numbness, or an unusual pale skin on the hand or on the side of your fistula.  You have dizziness or weakness that you have not had before.  You have shortness of breath.  You have chest pain.  Your fistula disconnects or breaks, and there is bleeding that  cannot be easily controlled. Call for local emergency medical help. Do not try to drive yourself to the hospital. MAKE SURE YOU  Understand these instructions.  Will watch your condition.  Will get help right away if you are not doing well or get worse. Document Released: 07/06/2005 Document Revised: 09/28/2011 Document Reviewed: 12/24/2010 [facility] Patient Information 2014 [employer] [place].

## 2013-07-26 NOTE — H&P (View-Only) (Signed)
Patient has today for followup of her official mobilization of her cephalic vein upper arm fistula by myself on 04/24/2013. She is using her hemodialysis catheter reports no difficulty with this.  BP 190/79  Pulse 60  Ht 5\' 9"  (1.753 m)  Wt 138 lb 12.8 oz (62.959 kg)  BMI 20.49 kg/m2  On physical exam her incisions ever upper arm are well healed. She has a good dilatation of her cephalic vein near the antecubital space with the pulsatile nature. The vein in the upper arm I does dilate with proximal compression but has minimal flow without compressing more proximally. I by physical exam there is a distinct thrill in the area above the dilated vein above the antecubital space.  End stage renal disease with slowly maturing right arm AV fistula. She is status post ligation of competing branches in mobilization on 04/24/2013. I have recommended shuntogram and possible angioplasty as an outpatient hospital. We will do this on nondialysis day she prefers until after the first year. [facility]

## 2013-08-03 DIAGNOSIS — E1129 Type 2 diabetes mellitus with other diabetic kidney complication: Secondary | ICD-10-CM | POA: Diagnosis not present

## 2013-08-18 ENCOUNTER — Other Ambulatory Visit (HOSPITAL_COMMUNITY): Payer: Self-pay | Admitting: Nephrology

## 2013-08-18 DIAGNOSIS — N186 End stage renal disease: Secondary | ICD-10-CM

## 2013-08-22 DIAGNOSIS — D509 Iron deficiency anemia, unspecified: Secondary | ICD-10-CM | POA: Diagnosis not present

## 2013-08-22 DIAGNOSIS — E1129 Type 2 diabetes mellitus with other diabetic kidney complication: Secondary | ICD-10-CM | POA: Diagnosis not present

## 2013-08-22 DIAGNOSIS — N2581 Secondary hyperparathyroidism of renal origin: Secondary | ICD-10-CM | POA: Diagnosis not present

## 2013-08-22 DIAGNOSIS — N186 End stage renal disease: Secondary | ICD-10-CM | POA: Diagnosis not present

## 2013-08-22 DIAGNOSIS — D631 Anemia in chronic kidney disease: Secondary | ICD-10-CM | POA: Diagnosis not present

## 2013-08-22 DIAGNOSIS — Z23 Encounter for immunization: Secondary | ICD-10-CM | POA: Diagnosis not present

## 2013-08-23 ENCOUNTER — Ambulatory Visit (HOSPITAL_COMMUNITY)
Admission: RE | Admit: 2013-08-23 | Discharge: 2013-08-23 | Disposition: A | Discharge status: Home / Self Care | Payer: Medicare Other | Source: Ambulatory Visit | Attending: Nephrology | Admitting: Nephrology

## 2013-08-23 DIAGNOSIS — N186 End stage renal disease: Secondary | ICD-10-CM

## 2013-08-23 DIAGNOSIS — Z452 Encounter for adjustment and management of vascular access device: Secondary | ICD-10-CM | POA: Insufficient documentation

## 2013-08-23 NOTE — Progress Notes (Signed)
Successful removal of right IJ tunneled HD catheter, mature right UE AVF, catheter no longer needed. No immediate complications, sterile dressing applied.  Pattricia BossKoreen Esaul Dorwart PA-C Interventional Radiology  08/23/13  10:31 AM

## 2013-09-16 DIAGNOSIS — N186 End stage renal disease: Secondary | ICD-10-CM | POA: Diagnosis not present

## 2013-09-19 DIAGNOSIS — D509 Iron deficiency anemia, unspecified: Secondary | ICD-10-CM | POA: Diagnosis not present

## 2013-09-19 DIAGNOSIS — N039 Chronic nephritic syndrome with unspecified morphologic changes: Secondary | ICD-10-CM | POA: Diagnosis not present

## 2013-09-19 DIAGNOSIS — N2581 Secondary hyperparathyroidism of renal origin: Secondary | ICD-10-CM | POA: Diagnosis not present

## 2013-09-19 DIAGNOSIS — E1129 Type 2 diabetes mellitus with other diabetic kidney complication: Secondary | ICD-10-CM | POA: Diagnosis not present

## 2013-09-19 DIAGNOSIS — N186 End stage renal disease: Secondary | ICD-10-CM | POA: Diagnosis not present

## 2013-09-19 DIAGNOSIS — D631 Anemia in chronic kidney disease: Secondary | ICD-10-CM | POA: Diagnosis not present

## 2013-10-09 DIAGNOSIS — I871 Compression of vein: Secondary | ICD-10-CM | POA: Diagnosis not present

## 2013-10-09 DIAGNOSIS — T82898A Other specified complication of vascular prosthetic devices, implants and grafts, initial encounter: Secondary | ICD-10-CM | POA: Diagnosis not present

## 2013-10-09 DIAGNOSIS — N186 End stage renal disease: Secondary | ICD-10-CM | POA: Diagnosis not present

## 2013-10-17 DIAGNOSIS — N186 End stage renal disease: Secondary | ICD-10-CM | POA: Diagnosis not present

## 2013-10-19 DIAGNOSIS — N2581 Secondary hyperparathyroidism of renal origin: Secondary | ICD-10-CM | POA: Diagnosis not present

## 2013-10-19 DIAGNOSIS — D509 Iron deficiency anemia, unspecified: Secondary | ICD-10-CM | POA: Diagnosis not present

## 2013-10-19 DIAGNOSIS — D631 Anemia in chronic kidney disease: Secondary | ICD-10-CM | POA: Diagnosis not present

## 2013-10-19 DIAGNOSIS — E1129 Type 2 diabetes mellitus with other diabetic kidney complication: Secondary | ICD-10-CM | POA: Diagnosis not present

## 2013-10-19 DIAGNOSIS — N186 End stage renal disease: Secondary | ICD-10-CM | POA: Diagnosis not present

## 2013-10-26 DIAGNOSIS — E1129 Type 2 diabetes mellitus with other diabetic kidney complication: Secondary | ICD-10-CM | POA: Diagnosis not present

## 2013-11-16 DIAGNOSIS — N186 End stage renal disease: Secondary | ICD-10-CM | POA: Diagnosis not present

## 2013-11-18 DIAGNOSIS — N186 End stage renal disease: Secondary | ICD-10-CM | POA: Diagnosis not present

## 2013-11-18 DIAGNOSIS — D631 Anemia in chronic kidney disease: Secondary | ICD-10-CM | POA: Diagnosis not present

## 2013-11-18 DIAGNOSIS — D509 Iron deficiency anemia, unspecified: Secondary | ICD-10-CM | POA: Diagnosis not present

## 2013-11-18 DIAGNOSIS — N2581 Secondary hyperparathyroidism of renal origin: Secondary | ICD-10-CM | POA: Diagnosis not present

## 2013-11-18 DIAGNOSIS — E1129 Type 2 diabetes mellitus with other diabetic kidney complication: Secondary | ICD-10-CM | POA: Diagnosis not present

## 2013-12-06 DIAGNOSIS — T82898A Other specified complication of vascular prosthetic devices, implants and grafts, initial encounter: Secondary | ICD-10-CM | POA: Diagnosis not present

## 2013-12-06 DIAGNOSIS — I871 Compression of vein: Secondary | ICD-10-CM | POA: Diagnosis not present

## 2013-12-06 DIAGNOSIS — N186 End stage renal disease: Secondary | ICD-10-CM | POA: Diagnosis not present

## 2013-12-17 DIAGNOSIS — N186 End stage renal disease: Secondary | ICD-10-CM | POA: Diagnosis not present

## 2013-12-19 DIAGNOSIS — N186 End stage renal disease: Secondary | ICD-10-CM | POA: Diagnosis not present

## 2013-12-19 DIAGNOSIS — N039 Chronic nephritic syndrome with unspecified morphologic changes: Secondary | ICD-10-CM | POA: Diagnosis not present

## 2013-12-19 DIAGNOSIS — N2581 Secondary hyperparathyroidism of renal origin: Secondary | ICD-10-CM | POA: Diagnosis not present

## 2013-12-19 DIAGNOSIS — D509 Iron deficiency anemia, unspecified: Secondary | ICD-10-CM | POA: Diagnosis not present

## 2013-12-19 DIAGNOSIS — E1129 Type 2 diabetes mellitus with other diabetic kidney complication: Secondary | ICD-10-CM | POA: Diagnosis not present

## 2013-12-19 DIAGNOSIS — D631 Anemia in chronic kidney disease: Secondary | ICD-10-CM | POA: Diagnosis not present

## 2013-12-19 DIAGNOSIS — Z23 Encounter for immunization: Secondary | ICD-10-CM | POA: Diagnosis not present

## 2013-12-22 DIAGNOSIS — N186 End stage renal disease: Secondary | ICD-10-CM | POA: Diagnosis not present

## 2013-12-22 DIAGNOSIS — T82898A Other specified complication of vascular prosthetic devices, implants and grafts, initial encounter: Secondary | ICD-10-CM | POA: Diagnosis not present

## 2013-12-22 DIAGNOSIS — I871 Compression of vein: Secondary | ICD-10-CM | POA: Diagnosis not present

## 2014-01-16 DIAGNOSIS — N186 End stage renal disease: Secondary | ICD-10-CM | POA: Diagnosis not present

## 2014-01-18 DIAGNOSIS — D509 Iron deficiency anemia, unspecified: Secondary | ICD-10-CM | POA: Diagnosis not present

## 2014-01-18 DIAGNOSIS — D631 Anemia in chronic kidney disease: Secondary | ICD-10-CM | POA: Diagnosis not present

## 2014-01-18 DIAGNOSIS — N2581 Secondary hyperparathyroidism of renal origin: Secondary | ICD-10-CM | POA: Diagnosis not present

## 2014-01-18 DIAGNOSIS — N186 End stage renal disease: Secondary | ICD-10-CM | POA: Diagnosis not present

## 2014-01-18 DIAGNOSIS — E1129 Type 2 diabetes mellitus with other diabetic kidney complication: Secondary | ICD-10-CM | POA: Diagnosis not present

## 2014-01-18 DIAGNOSIS — N039 Chronic nephritic syndrome with unspecified morphologic changes: Secondary | ICD-10-CM | POA: Diagnosis not present

## 2014-01-25 DIAGNOSIS — E1129 Type 2 diabetes mellitus with other diabetic kidney complication: Secondary | ICD-10-CM | POA: Diagnosis not present

## 2014-02-16 DIAGNOSIS — N186 End stage renal disease: Secondary | ICD-10-CM | POA: Diagnosis not present

## 2014-02-17 DIAGNOSIS — D509 Iron deficiency anemia, unspecified: Secondary | ICD-10-CM | POA: Diagnosis not present

## 2014-02-17 DIAGNOSIS — N186 End stage renal disease: Secondary | ICD-10-CM | POA: Diagnosis not present

## 2014-02-17 DIAGNOSIS — D631 Anemia in chronic kidney disease: Secondary | ICD-10-CM | POA: Diagnosis not present

## 2014-02-17 DIAGNOSIS — N039 Chronic nephritic syndrome with unspecified morphologic changes: Secondary | ICD-10-CM | POA: Diagnosis not present

## 2014-02-17 DIAGNOSIS — E1129 Type 2 diabetes mellitus with other diabetic kidney complication: Secondary | ICD-10-CM | POA: Diagnosis not present

## 2014-02-17 DIAGNOSIS — N2581 Secondary hyperparathyroidism of renal origin: Secondary | ICD-10-CM | POA: Diagnosis not present

## 2014-03-12 DIAGNOSIS — N186 End stage renal disease: Secondary | ICD-10-CM | POA: Diagnosis not present

## 2014-03-12 DIAGNOSIS — I871 Compression of vein: Secondary | ICD-10-CM | POA: Diagnosis not present

## 2014-03-12 DIAGNOSIS — T82898A Other specified complication of vascular prosthetic devices, implants and grafts, initial encounter: Secondary | ICD-10-CM | POA: Diagnosis not present

## 2014-03-19 DIAGNOSIS — N186 End stage renal disease: Secondary | ICD-10-CM | POA: Diagnosis not present

## 2014-03-20 DIAGNOSIS — D509 Iron deficiency anemia, unspecified: Secondary | ICD-10-CM | POA: Diagnosis not present

## 2014-03-20 DIAGNOSIS — N186 End stage renal disease: Secondary | ICD-10-CM | POA: Diagnosis not present

## 2014-03-20 DIAGNOSIS — E1129 Type 2 diabetes mellitus with other diabetic kidney complication: Secondary | ICD-10-CM | POA: Diagnosis not present

## 2014-03-20 DIAGNOSIS — N2581 Secondary hyperparathyroidism of renal origin: Secondary | ICD-10-CM | POA: Diagnosis not present

## 2014-04-18 DIAGNOSIS — N186 End stage renal disease: Secondary | ICD-10-CM | POA: Diagnosis not present

## 2014-04-19 DIAGNOSIS — D509 Iron deficiency anemia, unspecified: Secondary | ICD-10-CM | POA: Diagnosis not present

## 2014-04-19 DIAGNOSIS — Z23 Encounter for immunization: Secondary | ICD-10-CM | POA: Diagnosis not present

## 2014-04-19 DIAGNOSIS — D631 Anemia in chronic kidney disease: Secondary | ICD-10-CM | POA: Diagnosis not present

## 2014-04-19 DIAGNOSIS — N186 End stage renal disease: Secondary | ICD-10-CM | POA: Diagnosis not present

## 2014-04-19 DIAGNOSIS — E1129 Type 2 diabetes mellitus with other diabetic kidney complication: Secondary | ICD-10-CM | POA: Diagnosis not present

## 2014-04-19 DIAGNOSIS — N2581 Secondary hyperparathyroidism of renal origin: Secondary | ICD-10-CM | POA: Diagnosis not present

## 2014-05-03 DIAGNOSIS — E1129 Type 2 diabetes mellitus with other diabetic kidney complication: Secondary | ICD-10-CM | POA: Diagnosis not present

## 2014-05-19 DIAGNOSIS — N186 End stage renal disease: Secondary | ICD-10-CM | POA: Diagnosis not present

## 2014-05-19 DIAGNOSIS — Z992 Dependence on renal dialysis: Secondary | ICD-10-CM | POA: Diagnosis not present

## 2014-05-22 DIAGNOSIS — E1129 Type 2 diabetes mellitus with other diabetic kidney complication: Secondary | ICD-10-CM | POA: Diagnosis not present

## 2014-05-22 DIAGNOSIS — D509 Iron deficiency anemia, unspecified: Secondary | ICD-10-CM | POA: Diagnosis not present

## 2014-05-22 DIAGNOSIS — D631 Anemia in chronic kidney disease: Secondary | ICD-10-CM | POA: Diagnosis not present

## 2014-05-22 DIAGNOSIS — N186 End stage renal disease: Secondary | ICD-10-CM | POA: Diagnosis not present

## 2014-05-22 DIAGNOSIS — N2581 Secondary hyperparathyroidism of renal origin: Secondary | ICD-10-CM | POA: Diagnosis not present

## 2014-06-18 DIAGNOSIS — N186 End stage renal disease: Secondary | ICD-10-CM | POA: Diagnosis not present

## 2014-06-18 DIAGNOSIS — Z992 Dependence on renal dialysis: Secondary | ICD-10-CM | POA: Diagnosis not present

## 2014-06-19 DIAGNOSIS — N2581 Secondary hyperparathyroidism of renal origin: Secondary | ICD-10-CM | POA: Diagnosis not present

## 2014-06-19 DIAGNOSIS — D509 Iron deficiency anemia, unspecified: Secondary | ICD-10-CM | POA: Diagnosis not present

## 2014-06-19 DIAGNOSIS — E1129 Type 2 diabetes mellitus with other diabetic kidney complication: Secondary | ICD-10-CM | POA: Diagnosis not present

## 2014-06-19 DIAGNOSIS — D631 Anemia in chronic kidney disease: Secondary | ICD-10-CM | POA: Diagnosis not present

## 2014-06-19 DIAGNOSIS — N186 End stage renal disease: Secondary | ICD-10-CM | POA: Diagnosis not present

## 2014-06-28 ENCOUNTER — Encounter (HOSPITAL_COMMUNITY): Payer: Self-pay | Admitting: Vascular Surgery

## 2014-06-29 DIAGNOSIS — I871 Compression of vein: Secondary | ICD-10-CM | POA: Diagnosis not present

## 2014-06-29 DIAGNOSIS — T82858A Stenosis of vascular prosthetic devices, implants and grafts, initial encounter: Secondary | ICD-10-CM | POA: Diagnosis not present

## 2014-06-29 DIAGNOSIS — Z992 Dependence on renal dialysis: Secondary | ICD-10-CM | POA: Diagnosis not present

## 2014-06-29 DIAGNOSIS — N186 End stage renal disease: Secondary | ICD-10-CM | POA: Diagnosis not present

## 2014-07-03 ENCOUNTER — Other Ambulatory Visit: Payer: Self-pay | Admitting: Nurse Practitioner

## 2014-07-04 DIAGNOSIS — I10 Essential (primary) hypertension: Secondary | ICD-10-CM | POA: Diagnosis not present

## 2014-07-04 DIAGNOSIS — I120 Hypertensive chronic kidney disease with stage 5 chronic kidney disease or end stage renal disease: Secondary | ICD-10-CM | POA: Diagnosis not present

## 2014-07-04 DIAGNOSIS — E0822 Diabetes mellitus due to underlying condition with diabetic chronic kidney disease: Secondary | ICD-10-CM | POA: Diagnosis not present

## 2014-07-19 DIAGNOSIS — N186 End stage renal disease: Secondary | ICD-10-CM | POA: Diagnosis not present

## 2014-07-19 DIAGNOSIS — Z992 Dependence on renal dialysis: Secondary | ICD-10-CM | POA: Diagnosis not present

## 2014-07-22 DIAGNOSIS — N2581 Secondary hyperparathyroidism of renal origin: Secondary | ICD-10-CM | POA: Diagnosis not present

## 2014-07-22 DIAGNOSIS — D509 Iron deficiency anemia, unspecified: Secondary | ICD-10-CM | POA: Diagnosis not present

## 2014-07-22 DIAGNOSIS — D631 Anemia in chronic kidney disease: Secondary | ICD-10-CM | POA: Diagnosis not present

## 2014-07-22 DIAGNOSIS — E1129 Type 2 diabetes mellitus with other diabetic kidney complication: Secondary | ICD-10-CM | POA: Diagnosis not present

## 2014-07-22 DIAGNOSIS — N186 End stage renal disease: Secondary | ICD-10-CM | POA: Diagnosis not present

## 2014-07-24 DIAGNOSIS — E1129 Type 2 diabetes mellitus with other diabetic kidney complication: Secondary | ICD-10-CM | POA: Diagnosis not present

## 2014-07-24 DIAGNOSIS — D631 Anemia in chronic kidney disease: Secondary | ICD-10-CM | POA: Diagnosis not present

## 2014-07-24 DIAGNOSIS — N186 End stage renal disease: Secondary | ICD-10-CM | POA: Diagnosis not present

## 2014-07-24 DIAGNOSIS — D509 Iron deficiency anemia, unspecified: Secondary | ICD-10-CM | POA: Diagnosis not present

## 2014-07-24 DIAGNOSIS — N2581 Secondary hyperparathyroidism of renal origin: Secondary | ICD-10-CM | POA: Diagnosis not present

## 2014-07-26 DIAGNOSIS — D631 Anemia in chronic kidney disease: Secondary | ICD-10-CM | POA: Diagnosis not present

## 2014-07-26 DIAGNOSIS — N2581 Secondary hyperparathyroidism of renal origin: Secondary | ICD-10-CM | POA: Diagnosis not present

## 2014-07-26 DIAGNOSIS — D509 Iron deficiency anemia, unspecified: Secondary | ICD-10-CM | POA: Diagnosis not present

## 2014-07-26 DIAGNOSIS — E1129 Type 2 diabetes mellitus with other diabetic kidney complication: Secondary | ICD-10-CM | POA: Diagnosis not present

## 2014-07-26 DIAGNOSIS — N186 End stage renal disease: Secondary | ICD-10-CM | POA: Diagnosis not present

## 2014-07-28 DIAGNOSIS — N186 End stage renal disease: Secondary | ICD-10-CM | POA: Diagnosis not present

## 2014-07-28 DIAGNOSIS — N2581 Secondary hyperparathyroidism of renal origin: Secondary | ICD-10-CM | POA: Diagnosis not present

## 2014-07-28 DIAGNOSIS — D509 Iron deficiency anemia, unspecified: Secondary | ICD-10-CM | POA: Diagnosis not present

## 2014-07-28 DIAGNOSIS — D631 Anemia in chronic kidney disease: Secondary | ICD-10-CM | POA: Diagnosis not present

## 2014-07-28 DIAGNOSIS — E1129 Type 2 diabetes mellitus with other diabetic kidney complication: Secondary | ICD-10-CM | POA: Diagnosis not present

## 2014-07-31 DIAGNOSIS — D509 Iron deficiency anemia, unspecified: Secondary | ICD-10-CM | POA: Diagnosis not present

## 2014-07-31 DIAGNOSIS — N186 End stage renal disease: Secondary | ICD-10-CM | POA: Diagnosis not present

## 2014-07-31 DIAGNOSIS — D631 Anemia in chronic kidney disease: Secondary | ICD-10-CM | POA: Diagnosis not present

## 2014-07-31 DIAGNOSIS — N2581 Secondary hyperparathyroidism of renal origin: Secondary | ICD-10-CM | POA: Diagnosis not present

## 2014-07-31 DIAGNOSIS — E1129 Type 2 diabetes mellitus with other diabetic kidney complication: Secondary | ICD-10-CM | POA: Diagnosis not present

## 2014-08-02 DIAGNOSIS — N2581 Secondary hyperparathyroidism of renal origin: Secondary | ICD-10-CM | POA: Diagnosis not present

## 2014-08-02 DIAGNOSIS — D509 Iron deficiency anemia, unspecified: Secondary | ICD-10-CM | POA: Diagnosis not present

## 2014-08-02 DIAGNOSIS — E1129 Type 2 diabetes mellitus with other diabetic kidney complication: Secondary | ICD-10-CM | POA: Diagnosis not present

## 2014-08-02 DIAGNOSIS — D631 Anemia in chronic kidney disease: Secondary | ICD-10-CM | POA: Diagnosis not present

## 2014-08-02 DIAGNOSIS — N186 End stage renal disease: Secondary | ICD-10-CM | POA: Diagnosis not present

## 2014-08-04 DIAGNOSIS — D509 Iron deficiency anemia, unspecified: Secondary | ICD-10-CM | POA: Diagnosis not present

## 2014-08-04 DIAGNOSIS — N186 End stage renal disease: Secondary | ICD-10-CM | POA: Diagnosis not present

## 2014-08-04 DIAGNOSIS — N2581 Secondary hyperparathyroidism of renal origin: Secondary | ICD-10-CM | POA: Diagnosis not present

## 2014-08-04 DIAGNOSIS — D631 Anemia in chronic kidney disease: Secondary | ICD-10-CM | POA: Diagnosis not present

## 2014-08-04 DIAGNOSIS — E1129 Type 2 diabetes mellitus with other diabetic kidney complication: Secondary | ICD-10-CM | POA: Diagnosis not present

## 2014-08-06 DIAGNOSIS — N186 End stage renal disease: Secondary | ICD-10-CM | POA: Diagnosis not present

## 2014-08-06 DIAGNOSIS — E0822 Diabetes mellitus due to underlying condition with diabetic chronic kidney disease: Secondary | ICD-10-CM | POA: Diagnosis not present

## 2014-08-06 DIAGNOSIS — I120 Hypertensive chronic kidney disease with stage 5 chronic kidney disease or end stage renal disease: Secondary | ICD-10-CM | POA: Diagnosis not present

## 2014-08-07 DIAGNOSIS — N2581 Secondary hyperparathyroidism of renal origin: Secondary | ICD-10-CM | POA: Diagnosis not present

## 2014-08-07 DIAGNOSIS — E1129 Type 2 diabetes mellitus with other diabetic kidney complication: Secondary | ICD-10-CM | POA: Diagnosis not present

## 2014-08-07 DIAGNOSIS — D509 Iron deficiency anemia, unspecified: Secondary | ICD-10-CM | POA: Diagnosis not present

## 2014-08-07 DIAGNOSIS — N186 End stage renal disease: Secondary | ICD-10-CM | POA: Diagnosis not present

## 2014-08-07 DIAGNOSIS — D631 Anemia in chronic kidney disease: Secondary | ICD-10-CM | POA: Diagnosis not present

## 2014-08-09 DIAGNOSIS — D631 Anemia in chronic kidney disease: Secondary | ICD-10-CM | POA: Diagnosis not present

## 2014-08-09 DIAGNOSIS — D509 Iron deficiency anemia, unspecified: Secondary | ICD-10-CM | POA: Diagnosis not present

## 2014-08-09 DIAGNOSIS — N186 End stage renal disease: Secondary | ICD-10-CM | POA: Diagnosis not present

## 2014-08-09 DIAGNOSIS — N2581 Secondary hyperparathyroidism of renal origin: Secondary | ICD-10-CM | POA: Diagnosis not present

## 2014-08-09 DIAGNOSIS — E1129 Type 2 diabetes mellitus with other diabetic kidney complication: Secondary | ICD-10-CM | POA: Diagnosis not present

## 2014-08-14 DIAGNOSIS — E1129 Type 2 diabetes mellitus with other diabetic kidney complication: Secondary | ICD-10-CM | POA: Diagnosis not present

## 2014-08-14 DIAGNOSIS — N186 End stage renal disease: Secondary | ICD-10-CM | POA: Diagnosis not present

## 2014-08-14 DIAGNOSIS — D509 Iron deficiency anemia, unspecified: Secondary | ICD-10-CM | POA: Diagnosis not present

## 2014-08-14 DIAGNOSIS — N2581 Secondary hyperparathyroidism of renal origin: Secondary | ICD-10-CM | POA: Diagnosis not present

## 2014-08-14 DIAGNOSIS — D631 Anemia in chronic kidney disease: Secondary | ICD-10-CM | POA: Diagnosis not present

## 2014-08-16 DIAGNOSIS — N186 End stage renal disease: Secondary | ICD-10-CM | POA: Diagnosis not present

## 2014-08-16 DIAGNOSIS — D631 Anemia in chronic kidney disease: Secondary | ICD-10-CM | POA: Diagnosis not present

## 2014-08-16 DIAGNOSIS — N2581 Secondary hyperparathyroidism of renal origin: Secondary | ICD-10-CM | POA: Diagnosis not present

## 2014-08-16 DIAGNOSIS — D509 Iron deficiency anemia, unspecified: Secondary | ICD-10-CM | POA: Diagnosis not present

## 2014-08-16 DIAGNOSIS — E1129 Type 2 diabetes mellitus with other diabetic kidney complication: Secondary | ICD-10-CM | POA: Diagnosis not present

## 2014-08-18 DIAGNOSIS — N2581 Secondary hyperparathyroidism of renal origin: Secondary | ICD-10-CM | POA: Diagnosis not present

## 2014-08-18 DIAGNOSIS — N186 End stage renal disease: Secondary | ICD-10-CM | POA: Diagnosis not present

## 2014-08-18 DIAGNOSIS — E1129 Type 2 diabetes mellitus with other diabetic kidney complication: Secondary | ICD-10-CM | POA: Diagnosis not present

## 2014-08-18 DIAGNOSIS — D509 Iron deficiency anemia, unspecified: Secondary | ICD-10-CM | POA: Diagnosis not present

## 2014-08-18 DIAGNOSIS — D631 Anemia in chronic kidney disease: Secondary | ICD-10-CM | POA: Diagnosis not present

## 2014-08-19 DIAGNOSIS — Z992 Dependence on renal dialysis: Secondary | ICD-10-CM | POA: Diagnosis not present

## 2014-08-19 DIAGNOSIS — N186 End stage renal disease: Secondary | ICD-10-CM | POA: Diagnosis not present

## 2014-08-21 DIAGNOSIS — N2581 Secondary hyperparathyroidism of renal origin: Secondary | ICD-10-CM | POA: Diagnosis not present

## 2014-08-21 DIAGNOSIS — D631 Anemia in chronic kidney disease: Secondary | ICD-10-CM | POA: Diagnosis not present

## 2014-08-21 DIAGNOSIS — E1129 Type 2 diabetes mellitus with other diabetic kidney complication: Secondary | ICD-10-CM | POA: Diagnosis not present

## 2014-08-21 DIAGNOSIS — N186 End stage renal disease: Secondary | ICD-10-CM | POA: Diagnosis not present

## 2014-08-21 DIAGNOSIS — Z23 Encounter for immunization: Secondary | ICD-10-CM | POA: Diagnosis not present

## 2014-08-21 DIAGNOSIS — D509 Iron deficiency anemia, unspecified: Secondary | ICD-10-CM | POA: Diagnosis not present

## 2014-08-23 DIAGNOSIS — D631 Anemia in chronic kidney disease: Secondary | ICD-10-CM | POA: Diagnosis not present

## 2014-08-23 DIAGNOSIS — N186 End stage renal disease: Secondary | ICD-10-CM | POA: Diagnosis not present

## 2014-08-23 DIAGNOSIS — Z23 Encounter for immunization: Secondary | ICD-10-CM | POA: Diagnosis not present

## 2014-08-23 DIAGNOSIS — E1129 Type 2 diabetes mellitus with other diabetic kidney complication: Secondary | ICD-10-CM | POA: Diagnosis not present

## 2014-08-23 DIAGNOSIS — D509 Iron deficiency anemia, unspecified: Secondary | ICD-10-CM | POA: Diagnosis not present

## 2014-08-23 DIAGNOSIS — N2581 Secondary hyperparathyroidism of renal origin: Secondary | ICD-10-CM | POA: Diagnosis not present

## 2014-08-25 DIAGNOSIS — N186 End stage renal disease: Secondary | ICD-10-CM | POA: Diagnosis not present

## 2014-08-25 DIAGNOSIS — E1129 Type 2 diabetes mellitus with other diabetic kidney complication: Secondary | ICD-10-CM | POA: Diagnosis not present

## 2014-08-25 DIAGNOSIS — Z23 Encounter for immunization: Secondary | ICD-10-CM | POA: Diagnosis not present

## 2014-08-25 DIAGNOSIS — D509 Iron deficiency anemia, unspecified: Secondary | ICD-10-CM | POA: Diagnosis not present

## 2014-08-25 DIAGNOSIS — N2581 Secondary hyperparathyroidism of renal origin: Secondary | ICD-10-CM | POA: Diagnosis not present

## 2014-08-25 DIAGNOSIS — D631 Anemia in chronic kidney disease: Secondary | ICD-10-CM | POA: Diagnosis not present

## 2014-08-27 DIAGNOSIS — Z992 Dependence on renal dialysis: Secondary | ICD-10-CM | POA: Diagnosis not present

## 2014-08-27 DIAGNOSIS — N186 End stage renal disease: Secondary | ICD-10-CM | POA: Diagnosis not present

## 2014-08-27 DIAGNOSIS — I871 Compression of vein: Secondary | ICD-10-CM | POA: Diagnosis not present

## 2014-08-27 DIAGNOSIS — T82858D Stenosis of vascular prosthetic devices, implants and grafts, subsequent encounter: Secondary | ICD-10-CM | POA: Diagnosis not present

## 2014-08-28 DIAGNOSIS — N2581 Secondary hyperparathyroidism of renal origin: Secondary | ICD-10-CM | POA: Diagnosis not present

## 2014-08-28 DIAGNOSIS — D509 Iron deficiency anemia, unspecified: Secondary | ICD-10-CM | POA: Diagnosis not present

## 2014-08-28 DIAGNOSIS — D631 Anemia in chronic kidney disease: Secondary | ICD-10-CM | POA: Diagnosis not present

## 2014-08-28 DIAGNOSIS — N186 End stage renal disease: Secondary | ICD-10-CM | POA: Diagnosis not present

## 2014-08-28 DIAGNOSIS — Z23 Encounter for immunization: Secondary | ICD-10-CM | POA: Diagnosis not present

## 2014-08-28 DIAGNOSIS — E1129 Type 2 diabetes mellitus with other diabetic kidney complication: Secondary | ICD-10-CM | POA: Diagnosis not present

## 2014-08-30 DIAGNOSIS — E1129 Type 2 diabetes mellitus with other diabetic kidney complication: Secondary | ICD-10-CM | POA: Diagnosis not present

## 2014-08-30 DIAGNOSIS — Z23 Encounter for immunization: Secondary | ICD-10-CM | POA: Diagnosis not present

## 2014-08-30 DIAGNOSIS — D631 Anemia in chronic kidney disease: Secondary | ICD-10-CM | POA: Diagnosis not present

## 2014-08-30 DIAGNOSIS — N2581 Secondary hyperparathyroidism of renal origin: Secondary | ICD-10-CM | POA: Diagnosis not present

## 2014-08-30 DIAGNOSIS — D509 Iron deficiency anemia, unspecified: Secondary | ICD-10-CM | POA: Diagnosis not present

## 2014-08-30 DIAGNOSIS — N186 End stage renal disease: Secondary | ICD-10-CM | POA: Diagnosis not present

## 2014-09-01 DIAGNOSIS — D631 Anemia in chronic kidney disease: Secondary | ICD-10-CM | POA: Diagnosis not present

## 2014-09-01 DIAGNOSIS — E1129 Type 2 diabetes mellitus with other diabetic kidney complication: Secondary | ICD-10-CM | POA: Diagnosis not present

## 2014-09-01 DIAGNOSIS — N2581 Secondary hyperparathyroidism of renal origin: Secondary | ICD-10-CM | POA: Diagnosis not present

## 2014-09-01 DIAGNOSIS — D509 Iron deficiency anemia, unspecified: Secondary | ICD-10-CM | POA: Diagnosis not present

## 2014-09-01 DIAGNOSIS — Z23 Encounter for immunization: Secondary | ICD-10-CM | POA: Diagnosis not present

## 2014-09-01 DIAGNOSIS — N186 End stage renal disease: Secondary | ICD-10-CM | POA: Diagnosis not present

## 2014-09-04 DIAGNOSIS — E1129 Type 2 diabetes mellitus with other diabetic kidney complication: Secondary | ICD-10-CM | POA: Diagnosis not present

## 2014-09-04 DIAGNOSIS — N186 End stage renal disease: Secondary | ICD-10-CM | POA: Diagnosis not present

## 2014-09-04 DIAGNOSIS — N2581 Secondary hyperparathyroidism of renal origin: Secondary | ICD-10-CM | POA: Diagnosis not present

## 2014-09-04 DIAGNOSIS — D631 Anemia in chronic kidney disease: Secondary | ICD-10-CM | POA: Diagnosis not present

## 2014-09-04 DIAGNOSIS — Z23 Encounter for immunization: Secondary | ICD-10-CM | POA: Diagnosis not present

## 2014-09-04 DIAGNOSIS — D509 Iron deficiency anemia, unspecified: Secondary | ICD-10-CM | POA: Diagnosis not present

## 2014-09-06 DIAGNOSIS — E1129 Type 2 diabetes mellitus with other diabetic kidney complication: Secondary | ICD-10-CM | POA: Diagnosis not present

## 2014-09-06 DIAGNOSIS — N2581 Secondary hyperparathyroidism of renal origin: Secondary | ICD-10-CM | POA: Diagnosis not present

## 2014-09-06 DIAGNOSIS — D509 Iron deficiency anemia, unspecified: Secondary | ICD-10-CM | POA: Diagnosis not present

## 2014-09-06 DIAGNOSIS — N186 End stage renal disease: Secondary | ICD-10-CM | POA: Diagnosis not present

## 2014-09-06 DIAGNOSIS — Z23 Encounter for immunization: Secondary | ICD-10-CM | POA: Diagnosis not present

## 2014-09-06 DIAGNOSIS — D631 Anemia in chronic kidney disease: Secondary | ICD-10-CM | POA: Diagnosis not present

## 2014-09-08 DIAGNOSIS — N2581 Secondary hyperparathyroidism of renal origin: Secondary | ICD-10-CM | POA: Diagnosis not present

## 2014-09-08 DIAGNOSIS — E1129 Type 2 diabetes mellitus with other diabetic kidney complication: Secondary | ICD-10-CM | POA: Diagnosis not present

## 2014-09-08 DIAGNOSIS — Z23 Encounter for immunization: Secondary | ICD-10-CM | POA: Diagnosis not present

## 2014-09-08 DIAGNOSIS — D509 Iron deficiency anemia, unspecified: Secondary | ICD-10-CM | POA: Diagnosis not present

## 2014-09-08 DIAGNOSIS — D631 Anemia in chronic kidney disease: Secondary | ICD-10-CM | POA: Diagnosis not present

## 2014-09-08 DIAGNOSIS — N186 End stage renal disease: Secondary | ICD-10-CM | POA: Diagnosis not present

## 2014-09-11 DIAGNOSIS — D631 Anemia in chronic kidney disease: Secondary | ICD-10-CM | POA: Diagnosis not present

## 2014-09-11 DIAGNOSIS — N186 End stage renal disease: Secondary | ICD-10-CM | POA: Diagnosis not present

## 2014-09-11 DIAGNOSIS — D509 Iron deficiency anemia, unspecified: Secondary | ICD-10-CM | POA: Diagnosis not present

## 2014-09-11 DIAGNOSIS — Z23 Encounter for immunization: Secondary | ICD-10-CM | POA: Diagnosis not present

## 2014-09-11 DIAGNOSIS — E1129 Type 2 diabetes mellitus with other diabetic kidney complication: Secondary | ICD-10-CM | POA: Diagnosis not present

## 2014-09-11 DIAGNOSIS — N2581 Secondary hyperparathyroidism of renal origin: Secondary | ICD-10-CM | POA: Diagnosis not present

## 2014-09-13 DIAGNOSIS — N186 End stage renal disease: Secondary | ICD-10-CM | POA: Diagnosis not present

## 2014-09-13 DIAGNOSIS — N2581 Secondary hyperparathyroidism of renal origin: Secondary | ICD-10-CM | POA: Diagnosis not present

## 2014-09-13 DIAGNOSIS — E1129 Type 2 diabetes mellitus with other diabetic kidney complication: Secondary | ICD-10-CM | POA: Diagnosis not present

## 2014-09-13 DIAGNOSIS — D509 Iron deficiency anemia, unspecified: Secondary | ICD-10-CM | POA: Diagnosis not present

## 2014-09-13 DIAGNOSIS — D631 Anemia in chronic kidney disease: Secondary | ICD-10-CM | POA: Diagnosis not present

## 2014-09-13 DIAGNOSIS — Z23 Encounter for immunization: Secondary | ICD-10-CM | POA: Diagnosis not present

## 2014-09-15 DIAGNOSIS — E1129 Type 2 diabetes mellitus with other diabetic kidney complication: Secondary | ICD-10-CM | POA: Diagnosis not present

## 2014-09-15 DIAGNOSIS — Z23 Encounter for immunization: Secondary | ICD-10-CM | POA: Diagnosis not present

## 2014-09-15 DIAGNOSIS — D631 Anemia in chronic kidney disease: Secondary | ICD-10-CM | POA: Diagnosis not present

## 2014-09-15 DIAGNOSIS — D509 Iron deficiency anemia, unspecified: Secondary | ICD-10-CM | POA: Diagnosis not present

## 2014-09-15 DIAGNOSIS — N2581 Secondary hyperparathyroidism of renal origin: Secondary | ICD-10-CM | POA: Diagnosis not present

## 2014-09-15 DIAGNOSIS — N186 End stage renal disease: Secondary | ICD-10-CM | POA: Diagnosis not present

## 2014-09-17 DIAGNOSIS — Z992 Dependence on renal dialysis: Secondary | ICD-10-CM | POA: Diagnosis not present

## 2014-09-17 DIAGNOSIS — N186 End stage renal disease: Secondary | ICD-10-CM | POA: Diagnosis not present

## 2014-09-18 DIAGNOSIS — D509 Iron deficiency anemia, unspecified: Secondary | ICD-10-CM | POA: Diagnosis not present

## 2014-09-18 DIAGNOSIS — N186 End stage renal disease: Secondary | ICD-10-CM | POA: Diagnosis not present

## 2014-09-18 DIAGNOSIS — N2581 Secondary hyperparathyroidism of renal origin: Secondary | ICD-10-CM | POA: Diagnosis not present

## 2014-09-18 DIAGNOSIS — E1129 Type 2 diabetes mellitus with other diabetic kidney complication: Secondary | ICD-10-CM | POA: Diagnosis not present

## 2014-09-20 DIAGNOSIS — N2581 Secondary hyperparathyroidism of renal origin: Secondary | ICD-10-CM | POA: Diagnosis not present

## 2014-09-20 DIAGNOSIS — N186 End stage renal disease: Secondary | ICD-10-CM | POA: Diagnosis not present

## 2014-09-20 DIAGNOSIS — E1129 Type 2 diabetes mellitus with other diabetic kidney complication: Secondary | ICD-10-CM | POA: Diagnosis not present

## 2014-09-20 DIAGNOSIS — D509 Iron deficiency anemia, unspecified: Secondary | ICD-10-CM | POA: Diagnosis not present

## 2014-09-22 DIAGNOSIS — D509 Iron deficiency anemia, unspecified: Secondary | ICD-10-CM | POA: Diagnosis not present

## 2014-09-22 DIAGNOSIS — N186 End stage renal disease: Secondary | ICD-10-CM | POA: Diagnosis not present

## 2014-09-22 DIAGNOSIS — E1129 Type 2 diabetes mellitus with other diabetic kidney complication: Secondary | ICD-10-CM | POA: Diagnosis not present

## 2014-09-22 DIAGNOSIS — N2581 Secondary hyperparathyroidism of renal origin: Secondary | ICD-10-CM | POA: Diagnosis not present

## 2014-09-25 DIAGNOSIS — N2581 Secondary hyperparathyroidism of renal origin: Secondary | ICD-10-CM | POA: Diagnosis not present

## 2014-09-25 DIAGNOSIS — D509 Iron deficiency anemia, unspecified: Secondary | ICD-10-CM | POA: Diagnosis not present

## 2014-09-25 DIAGNOSIS — N186 End stage renal disease: Secondary | ICD-10-CM | POA: Diagnosis not present

## 2014-09-25 DIAGNOSIS — E1129 Type 2 diabetes mellitus with other diabetic kidney complication: Secondary | ICD-10-CM | POA: Diagnosis not present

## 2014-09-27 DIAGNOSIS — N2581 Secondary hyperparathyroidism of renal origin: Secondary | ICD-10-CM | POA: Diagnosis not present

## 2014-09-27 DIAGNOSIS — N186 End stage renal disease: Secondary | ICD-10-CM | POA: Diagnosis not present

## 2014-09-27 DIAGNOSIS — E1129 Type 2 diabetes mellitus with other diabetic kidney complication: Secondary | ICD-10-CM | POA: Diagnosis not present

## 2014-09-27 DIAGNOSIS — D509 Iron deficiency anemia, unspecified: Secondary | ICD-10-CM | POA: Diagnosis not present

## 2014-09-29 DIAGNOSIS — E1129 Type 2 diabetes mellitus with other diabetic kidney complication: Secondary | ICD-10-CM | POA: Diagnosis not present

## 2014-09-29 DIAGNOSIS — D509 Iron deficiency anemia, unspecified: Secondary | ICD-10-CM | POA: Diagnosis not present

## 2014-09-29 DIAGNOSIS — N2581 Secondary hyperparathyroidism of renal origin: Secondary | ICD-10-CM | POA: Diagnosis not present

## 2014-09-29 DIAGNOSIS — N186 End stage renal disease: Secondary | ICD-10-CM | POA: Diagnosis not present

## 2014-10-02 DIAGNOSIS — E1129 Type 2 diabetes mellitus with other diabetic kidney complication: Secondary | ICD-10-CM | POA: Diagnosis not present

## 2014-10-02 DIAGNOSIS — D509 Iron deficiency anemia, unspecified: Secondary | ICD-10-CM | POA: Diagnosis not present

## 2014-10-02 DIAGNOSIS — N186 End stage renal disease: Secondary | ICD-10-CM | POA: Diagnosis not present

## 2014-10-02 DIAGNOSIS — N2581 Secondary hyperparathyroidism of renal origin: Secondary | ICD-10-CM | POA: Diagnosis not present

## 2014-10-04 DIAGNOSIS — E1129 Type 2 diabetes mellitus with other diabetic kidney complication: Secondary | ICD-10-CM | POA: Diagnosis not present

## 2014-10-04 DIAGNOSIS — D509 Iron deficiency anemia, unspecified: Secondary | ICD-10-CM | POA: Diagnosis not present

## 2014-10-04 DIAGNOSIS — N186 End stage renal disease: Secondary | ICD-10-CM | POA: Diagnosis not present

## 2014-10-04 DIAGNOSIS — N2581 Secondary hyperparathyroidism of renal origin: Secondary | ICD-10-CM | POA: Diagnosis not present

## 2014-10-06 DIAGNOSIS — D509 Iron deficiency anemia, unspecified: Secondary | ICD-10-CM | POA: Diagnosis not present

## 2014-10-06 DIAGNOSIS — N186 End stage renal disease: Secondary | ICD-10-CM | POA: Diagnosis not present

## 2014-10-06 DIAGNOSIS — E1129 Type 2 diabetes mellitus with other diabetic kidney complication: Secondary | ICD-10-CM | POA: Diagnosis not present

## 2014-10-06 DIAGNOSIS — N2581 Secondary hyperparathyroidism of renal origin: Secondary | ICD-10-CM | POA: Diagnosis not present

## 2014-10-09 DIAGNOSIS — D509 Iron deficiency anemia, unspecified: Secondary | ICD-10-CM | POA: Diagnosis not present

## 2014-10-09 DIAGNOSIS — N2581 Secondary hyperparathyroidism of renal origin: Secondary | ICD-10-CM | POA: Diagnosis not present

## 2014-10-09 DIAGNOSIS — N186 End stage renal disease: Secondary | ICD-10-CM | POA: Diagnosis not present

## 2014-10-09 DIAGNOSIS — E1129 Type 2 diabetes mellitus with other diabetic kidney complication: Secondary | ICD-10-CM | POA: Diagnosis not present

## 2014-10-10 DIAGNOSIS — N186 End stage renal disease: Secondary | ICD-10-CM | POA: Diagnosis not present

## 2014-10-10 DIAGNOSIS — E119 Type 2 diabetes mellitus without complications: Secondary | ICD-10-CM | POA: Diagnosis not present

## 2014-10-11 DIAGNOSIS — N186 End stage renal disease: Secondary | ICD-10-CM | POA: Diagnosis not present

## 2014-10-11 DIAGNOSIS — N2581 Secondary hyperparathyroidism of renal origin: Secondary | ICD-10-CM | POA: Diagnosis not present

## 2014-10-11 DIAGNOSIS — E1129 Type 2 diabetes mellitus with other diabetic kidney complication: Secondary | ICD-10-CM | POA: Diagnosis not present

## 2014-10-11 DIAGNOSIS — D509 Iron deficiency anemia, unspecified: Secondary | ICD-10-CM | POA: Diagnosis not present

## 2014-10-13 DIAGNOSIS — D509 Iron deficiency anemia, unspecified: Secondary | ICD-10-CM | POA: Diagnosis not present

## 2014-10-13 DIAGNOSIS — E1129 Type 2 diabetes mellitus with other diabetic kidney complication: Secondary | ICD-10-CM | POA: Diagnosis not present

## 2014-10-13 DIAGNOSIS — N186 End stage renal disease: Secondary | ICD-10-CM | POA: Diagnosis not present

## 2014-10-13 DIAGNOSIS — N2581 Secondary hyperparathyroidism of renal origin: Secondary | ICD-10-CM | POA: Diagnosis not present

## 2014-10-16 DIAGNOSIS — N186 End stage renal disease: Secondary | ICD-10-CM | POA: Diagnosis not present

## 2014-10-16 DIAGNOSIS — D509 Iron deficiency anemia, unspecified: Secondary | ICD-10-CM | POA: Diagnosis not present

## 2014-10-16 DIAGNOSIS — E1129 Type 2 diabetes mellitus with other diabetic kidney complication: Secondary | ICD-10-CM | POA: Diagnosis not present

## 2014-10-16 DIAGNOSIS — N2581 Secondary hyperparathyroidism of renal origin: Secondary | ICD-10-CM | POA: Diagnosis not present

## 2014-10-18 DIAGNOSIS — D509 Iron deficiency anemia, unspecified: Secondary | ICD-10-CM | POA: Diagnosis not present

## 2014-10-18 DIAGNOSIS — Z992 Dependence on renal dialysis: Secondary | ICD-10-CM | POA: Diagnosis not present

## 2014-10-18 DIAGNOSIS — N186 End stage renal disease: Secondary | ICD-10-CM | POA: Diagnosis not present

## 2014-10-18 DIAGNOSIS — E1129 Type 2 diabetes mellitus with other diabetic kidney complication: Secondary | ICD-10-CM | POA: Diagnosis not present

## 2014-10-18 DIAGNOSIS — N2581 Secondary hyperparathyroidism of renal origin: Secondary | ICD-10-CM | POA: Diagnosis not present

## 2014-10-20 DIAGNOSIS — N186 End stage renal disease: Secondary | ICD-10-CM | POA: Diagnosis not present

## 2014-10-20 DIAGNOSIS — D631 Anemia in chronic kidney disease: Secondary | ICD-10-CM | POA: Diagnosis not present

## 2014-10-20 DIAGNOSIS — E1129 Type 2 diabetes mellitus with other diabetic kidney complication: Secondary | ICD-10-CM | POA: Diagnosis not present

## 2014-10-20 DIAGNOSIS — D509 Iron deficiency anemia, unspecified: Secondary | ICD-10-CM | POA: Diagnosis not present

## 2014-10-20 DIAGNOSIS — N2581 Secondary hyperparathyroidism of renal origin: Secondary | ICD-10-CM | POA: Diagnosis not present

## 2014-10-23 DIAGNOSIS — N2581 Secondary hyperparathyroidism of renal origin: Secondary | ICD-10-CM | POA: Diagnosis not present

## 2014-10-23 DIAGNOSIS — D631 Anemia in chronic kidney disease: Secondary | ICD-10-CM | POA: Diagnosis not present

## 2014-10-23 DIAGNOSIS — E1129 Type 2 diabetes mellitus with other diabetic kidney complication: Secondary | ICD-10-CM | POA: Diagnosis not present

## 2014-10-23 DIAGNOSIS — D509 Iron deficiency anemia, unspecified: Secondary | ICD-10-CM | POA: Diagnosis not present

## 2014-10-23 DIAGNOSIS — N186 End stage renal disease: Secondary | ICD-10-CM | POA: Diagnosis not present

## 2014-10-25 DIAGNOSIS — D509 Iron deficiency anemia, unspecified: Secondary | ICD-10-CM | POA: Diagnosis not present

## 2014-10-25 DIAGNOSIS — N2581 Secondary hyperparathyroidism of renal origin: Secondary | ICD-10-CM | POA: Diagnosis not present

## 2014-10-25 DIAGNOSIS — D631 Anemia in chronic kidney disease: Secondary | ICD-10-CM | POA: Diagnosis not present

## 2014-10-25 DIAGNOSIS — N186 End stage renal disease: Secondary | ICD-10-CM | POA: Diagnosis not present

## 2014-10-25 DIAGNOSIS — E1129 Type 2 diabetes mellitus with other diabetic kidney complication: Secondary | ICD-10-CM | POA: Diagnosis not present

## 2014-10-27 DIAGNOSIS — D509 Iron deficiency anemia, unspecified: Secondary | ICD-10-CM | POA: Diagnosis not present

## 2014-10-27 DIAGNOSIS — N186 End stage renal disease: Secondary | ICD-10-CM | POA: Diagnosis not present

## 2014-10-27 DIAGNOSIS — D631 Anemia in chronic kidney disease: Secondary | ICD-10-CM | POA: Diagnosis not present

## 2014-10-27 DIAGNOSIS — E1129 Type 2 diabetes mellitus with other diabetic kidney complication: Secondary | ICD-10-CM | POA: Diagnosis not present

## 2014-10-27 DIAGNOSIS — N2581 Secondary hyperparathyroidism of renal origin: Secondary | ICD-10-CM | POA: Diagnosis not present

## 2014-10-30 DIAGNOSIS — N186 End stage renal disease: Secondary | ICD-10-CM | POA: Diagnosis not present

## 2014-10-30 DIAGNOSIS — N2581 Secondary hyperparathyroidism of renal origin: Secondary | ICD-10-CM | POA: Diagnosis not present

## 2014-10-30 DIAGNOSIS — E1129 Type 2 diabetes mellitus with other diabetic kidney complication: Secondary | ICD-10-CM | POA: Diagnosis not present

## 2014-10-30 DIAGNOSIS — D631 Anemia in chronic kidney disease: Secondary | ICD-10-CM | POA: Diagnosis not present

## 2014-10-30 DIAGNOSIS — D509 Iron deficiency anemia, unspecified: Secondary | ICD-10-CM | POA: Diagnosis not present

## 2014-11-01 DIAGNOSIS — N186 End stage renal disease: Secondary | ICD-10-CM | POA: Diagnosis not present

## 2014-11-01 DIAGNOSIS — E1129 Type 2 diabetes mellitus with other diabetic kidney complication: Secondary | ICD-10-CM | POA: Diagnosis not present

## 2014-11-01 DIAGNOSIS — D631 Anemia in chronic kidney disease: Secondary | ICD-10-CM | POA: Diagnosis not present

## 2014-11-01 DIAGNOSIS — D509 Iron deficiency anemia, unspecified: Secondary | ICD-10-CM | POA: Diagnosis not present

## 2014-11-01 DIAGNOSIS — N2581 Secondary hyperparathyroidism of renal origin: Secondary | ICD-10-CM | POA: Diagnosis not present

## 2014-11-03 DIAGNOSIS — D509 Iron deficiency anemia, unspecified: Secondary | ICD-10-CM | POA: Diagnosis not present

## 2014-11-03 DIAGNOSIS — E1129 Type 2 diabetes mellitus with other diabetic kidney complication: Secondary | ICD-10-CM | POA: Diagnosis not present

## 2014-11-03 DIAGNOSIS — D631 Anemia in chronic kidney disease: Secondary | ICD-10-CM | POA: Diagnosis not present

## 2014-11-03 DIAGNOSIS — N186 End stage renal disease: Secondary | ICD-10-CM | POA: Diagnosis not present

## 2014-11-03 DIAGNOSIS — N2581 Secondary hyperparathyroidism of renal origin: Secondary | ICD-10-CM | POA: Diagnosis not present

## 2014-11-06 DIAGNOSIS — D631 Anemia in chronic kidney disease: Secondary | ICD-10-CM | POA: Diagnosis not present

## 2014-11-06 DIAGNOSIS — N2581 Secondary hyperparathyroidism of renal origin: Secondary | ICD-10-CM | POA: Diagnosis not present

## 2014-11-06 DIAGNOSIS — N186 End stage renal disease: Secondary | ICD-10-CM | POA: Diagnosis not present

## 2014-11-06 DIAGNOSIS — E1129 Type 2 diabetes mellitus with other diabetic kidney complication: Secondary | ICD-10-CM | POA: Diagnosis not present

## 2014-11-06 DIAGNOSIS — D509 Iron deficiency anemia, unspecified: Secondary | ICD-10-CM | POA: Diagnosis not present

## 2014-11-08 DIAGNOSIS — N186 End stage renal disease: Secondary | ICD-10-CM | POA: Diagnosis not present

## 2014-11-08 DIAGNOSIS — E1129 Type 2 diabetes mellitus with other diabetic kidney complication: Secondary | ICD-10-CM | POA: Diagnosis not present

## 2014-11-08 DIAGNOSIS — N2581 Secondary hyperparathyroidism of renal origin: Secondary | ICD-10-CM | POA: Diagnosis not present

## 2014-11-08 DIAGNOSIS — D631 Anemia in chronic kidney disease: Secondary | ICD-10-CM | POA: Diagnosis not present

## 2014-11-08 DIAGNOSIS — D509 Iron deficiency anemia, unspecified: Secondary | ICD-10-CM | POA: Diagnosis not present

## 2014-11-09 DIAGNOSIS — T82858D Stenosis of vascular prosthetic devices, implants and grafts, subsequent encounter: Secondary | ICD-10-CM | POA: Diagnosis not present

## 2014-11-09 DIAGNOSIS — Z992 Dependence on renal dialysis: Secondary | ICD-10-CM | POA: Diagnosis not present

## 2014-11-09 DIAGNOSIS — N186 End stage renal disease: Secondary | ICD-10-CM | POA: Diagnosis not present

## 2014-11-09 DIAGNOSIS — I871 Compression of vein: Secondary | ICD-10-CM | POA: Diagnosis not present

## 2014-11-10 DIAGNOSIS — D631 Anemia in chronic kidney disease: Secondary | ICD-10-CM | POA: Diagnosis not present

## 2014-11-10 DIAGNOSIS — N2581 Secondary hyperparathyroidism of renal origin: Secondary | ICD-10-CM | POA: Diagnosis not present

## 2014-11-10 DIAGNOSIS — E1129 Type 2 diabetes mellitus with other diabetic kidney complication: Secondary | ICD-10-CM | POA: Diagnosis not present

## 2014-11-10 DIAGNOSIS — N186 End stage renal disease: Secondary | ICD-10-CM | POA: Diagnosis not present

## 2014-11-10 DIAGNOSIS — D509 Iron deficiency anemia, unspecified: Secondary | ICD-10-CM | POA: Diagnosis not present

## 2014-11-12 ENCOUNTER — Emergency Department (HOSPITAL_COMMUNITY)
Admission: EM | Admit: 2014-11-12 | Discharge: 2014-11-12 | Discharge status: Absent without leave | Payer: Self-pay | Source: Home / Self Care

## 2014-11-12 ENCOUNTER — Emergency Department (INDEPENDENT_AMBULATORY_CARE_PROVIDER_SITE_OTHER)
Admission: EM | Admit: 2014-11-12 | Discharge: 2014-11-12 | Disposition: A | Discharge status: Home / Self Care | Payer: Medicare Other | Source: Home / Self Care | Attending: Family Medicine | Admitting: Family Medicine

## 2014-11-12 ENCOUNTER — Encounter (HOSPITAL_COMMUNITY): Payer: Self-pay | Admitting: Emergency Medicine

## 2014-11-12 DIAGNOSIS — L139 Bullous disorder, unspecified: Secondary | ICD-10-CM | POA: Diagnosis not present

## 2014-11-12 MED ORDER — SILVER SULFADIAZINE 1 % EX CREA
TOPICAL_CREAM | Freq: Once | CUTANEOUS | Status: AC
  Administered 2014-11-12: 15:00:00 via TOPICAL

## 2014-11-12 MED ORDER — SILVER SULFADIAZINE 1 % EX CREA: 1.0000 "application " | TOPICAL_CREAM | Freq: Every day | CUTANEOUS | Status: DC

## 2014-11-12 NOTE — ED Notes (Signed)
C/o bilateral blisters on feet which was noticed Sunday States she did bust the blisters open  There is clear drainage present Pt is on dialysis

## 2014-11-12 NOTE — Discharge Instructions (Signed)
    Blisters Blisters are fluid-filled sacs that form within the skin. Common causes of blistering are friction, burns, and exposure to irritating chemicals. The fluid in the blister protects the underlying damaged skin. Most of the time it is not recommended that you open blisters. When a blister is opened, there is an increased chance for infection. Usually, a blister will open on its own. They then dry up and peel off within 10 days. If the blister is tense and uncomfortable (painful) the fluid may be drained. If it is drained the roof of the blister should be left intact. The draining should only be done by a medical professional under aseptic conditions. Poorly fitting shoes and boots can cause blisters by being too tight or too loose. Wearing extra socks or using tape, bandages, or pads over the blister-prone area helps prevent the problem by reducing friction. Blisters heal more slowly if you have diabetes or if you have problems with your circulation. You need to be careful about medical follow-up to prevent infection. HOME CARE INSTRUCTIONS  Protect areas where blisters have formed until the skin is healed. Use a special bandage with a hole cut in the middle around the blister. This reduces pressure and friction. When the blister breaks, trim off the loose skin and keep the area clean by washing it with soap daily. Soaking the blister or broken-open blister with diluted vinegar twice daily for 15 minutes will dry it up and speed the healing. Use 3 tablespoons of white vinegar per quart of water (45 mL white vinegar per liter of water). An antibiotic ointment and a bandage can be used to cover the area after soaking.  SEEK MEDICAL CARE IF:   You develop increased redness, pain, swelling, or drainage in the blistered area.  You develop a pus-like discharge from the blistered area, chills, or a fever. MAKE SURE YOU:   Understand these instructions.  Will watch your condition.  Will get help  right away if you are not doing well or get worse. Document Released: 08/13/2004 Document Revised: 09/28/2011 Document Reviewed: 07/11/2008 [employer] Patient Information 2015 [employer]. This information is not intended to replace advice given to you by your health care provider. Make sure you discuss any questions you have with your health care provider.  

## 2014-11-12 NOTE — ED Provider Notes (Signed)
CSN: [id number]     Arrival date & time 11/12/14  1351 History   First MD Initiated Contact with Patient 11/12/14 1428     Chief Complaint  Patient presents with  . Blister   (Consider location/radiation/quality/duration/timing/severity/associated sxs/prior Treatment) HPI Comments: 80 year old female noticed the onset of blister formation to the dorsal aspects of the feet yesterday. Left greater than right. She describes large blisters or bullae. The only location is to the dorsum of the feet. No other lesions occur. She denies oral symptoms. There is no pain or itching to the feet lesions. Denies fever, chills, fatigue or malaise. She has type 2 diabetes mellitus and chronic kidney disease requiring dialysis. She denies injuring her feet, burning or feet or applying any type of chemical, cream or ointment to her feet.   Past Medical History  Diagnosis Date  . Hypertension   . Diabetes mellitus   . Chronic kidney disease   . Thyroid disease   . Anemia   . Hyperlipidemia   . Arthritis    Past Surgical History  Procedure Laterality Date  . Arteriovenous graft placement Left     non function  . Eye surgery Bilateral     catarct  . Cesarean section    . Av fistula placement Right 01/30/2013    Procedure: ARTERIOVENOUS (AV) FISTULA CREATION;  Surgeon: Fransisco Hertz, MD;  Location: [facility] OR;  Service: Vascular;  Laterality: Right;  . Insertion of dialysis catheter Right 04/24/2013    Procedure: INSERTION OF DIALYSIS CATHETER;  Surgeon: Larina Earthly, MD;  Location: [facility] OR;  Service: Vascular;  Laterality: Right;  . Fistula superficialization Right 04/24/2013    Procedure: FISTULA SUPERFICIALIZATION;  Surgeon: Larina Earthly, MD;  Location: [facility] OR;  Service: Vascular;  Laterality: Right;  . Fistulogram Right 07/26/2013    Procedure: FISTULOGRAM;  Surgeon: Larina Earthly, MD;  Location: [facility] CATH LAB;  Service: Cardiovascular;  Laterality: Right;   Family History  Problem Relation Age of Onset  .  Diabetes Mother   . Heart disease Mother   . Hypertension Mother    History  Substance Use Topics  . Smoking status: Never Smoker   . Smokeless tobacco: Never Used  . Alcohol Use: No   OB History    No data available     Review of Systems  Constitutional: Negative.   Respiratory: Negative.   Cardiovascular: Negative.   Gastrointestinal: Negative.   Skin:       As per history of present illness  Neurological: Negative.     Allergies  Review of patient's allergies indicates no known allergies.  Home Medications   Prior to Admission medications   Medication Sig Start Date End Date Taking? Authorizing Provider  albuterol (PROVENTIL HFA;VENTOLIN HFA) 108 (90 BASE) MCG/ACT inhaler Inhale 1-2 puffs into the lungs every 6 (six) hours as needed for wheezing. 12/27/12   April Palumbo, MD  Aspirin Effervescent (ALKA-SELTZER PO) Take 1 tablet by mouth as needed. For indigestion and heartburn    Historical Provider, MD  B Complex-C-Folic Acid (B COMPLEX-VITAMIN C-FOLIC ACID) 1 MG tablet Take 1 tablet by mouth daily with breakfast.    Historical Provider, MD  FOSRENOL 1000 MG chewable tablet Chew 1,000 mg by mouth 3 (three) times daily with meals.  01/18/13   Historical Provider, MD  insulin aspart (NOVOLOG) 100 UNIT/ML injection Inject 5 Units into the skin daily as needed for high blood sugar.     Historical Provider, MD  insulin detemir (LEVEMIR)  100 UNIT/ML injection Inject 15 Units into the skin at bedtime.    Historical Provider, MD  IRON PO Take 1 tablet by mouth daily.    Historical Provider, MD  Melatonin 3 MG CAPS Take 1 capsule by mouth at bedtime as needed (sleep).     Historical Provider, MD  metoprolol (LOPRESSOR) 50 MG tablet Take 50 mg by mouth 2 (two) times daily.    Historical Provider, MD  mometasone (NASONEX) 50 MCG/ACT nasal spray Place 2 sprays into the nose daily as needed. For nasal congestion    Historical Provider, MD  oxyCODONE (ROXICODONE) 5 MG immediate release  tablet Take 1 tablet (5 mg total) by mouth every 4 (four) hours as needed for pain. 04/26/13   Hollice EspySendil K Krishnan, MD  pregabalin (LYRICA) 50 MG capsule Take 50 mg by mouth 3 (three) times daily.     Historical Provider, MD  silver sulfADIAZINE (SILVADENE) 1 % cream Apply 1 application topically daily. 11/12/14   Hayden Rasmussenavid Twinkle Sockwell, NP   BP 162/77 mmHg  Pulse 85  Temp(Src) 98.7 F (37.1 C) (Oral)  Resp 18  SpO2 99% Physical Exam  Constitutional: She is oriented to person, place, and time. She appears well-developed and well-nourished. No distress.  HENT:  Mouth/Throat: Oropharynx is clear and moist. No oropharyngeal exudate.  No lesions of any type are seen in the oral mucosa. No vesicles, or erosions, papules, ulcerations or other abnormalities. No tenderness.  She does wear dentures.   Cardiovascular: Normal rate.   Pulmonary/Chest: Effort normal.  Neurological: She is alert and oriented to person, place, and time. She exhibits normal muscle tone.  Skin: Skin is warm and dry.  Partially ruptured bullae to the dorsum of both feet. Left is worse than the right in regards to area involved. Are weak, flaccid tight vesicles/bullae remaining. Few areas are draining clear to slightly tanned serous fluid. No erythema. Mild swelling particularly of the left foot. No lymphangitis.  Nursing note and vitals reviewed.   ED Course  Procedures (including critical care time) Labs Review Labs Reviewed - No data to display  Imaging Review No results found.   MDM   1. Acute bullous dermatitis    Uncertain etio Refer to PCP or dermatologist Cool soap and saline soak here Apply silvadene dressing.    Hayden Rasmussenavid Valari Taylor, NP 11/12/14 [phone]

## 2014-11-13 DIAGNOSIS — E1129 Type 2 diabetes mellitus with other diabetic kidney complication: Secondary | ICD-10-CM | POA: Diagnosis not present

## 2014-11-13 DIAGNOSIS — D631 Anemia in chronic kidney disease: Secondary | ICD-10-CM | POA: Diagnosis not present

## 2014-11-13 DIAGNOSIS — N2581 Secondary hyperparathyroidism of renal origin: Secondary | ICD-10-CM | POA: Diagnosis not present

## 2014-11-13 DIAGNOSIS — D509 Iron deficiency anemia, unspecified: Secondary | ICD-10-CM | POA: Diagnosis not present

## 2014-11-13 DIAGNOSIS — N186 End stage renal disease: Secondary | ICD-10-CM | POA: Diagnosis not present

## 2014-11-15 DIAGNOSIS — D509 Iron deficiency anemia, unspecified: Secondary | ICD-10-CM | POA: Diagnosis not present

## 2014-11-15 DIAGNOSIS — N186 End stage renal disease: Secondary | ICD-10-CM | POA: Diagnosis not present

## 2014-11-15 DIAGNOSIS — N2581 Secondary hyperparathyroidism of renal origin: Secondary | ICD-10-CM | POA: Diagnosis not present

## 2014-11-15 DIAGNOSIS — D631 Anemia in chronic kidney disease: Secondary | ICD-10-CM | POA: Diagnosis not present

## 2014-11-15 DIAGNOSIS — E1129 Type 2 diabetes mellitus with other diabetic kidney complication: Secondary | ICD-10-CM | POA: Diagnosis not present

## 2014-11-17 DIAGNOSIS — D509 Iron deficiency anemia, unspecified: Secondary | ICD-10-CM | POA: Diagnosis not present

## 2014-11-17 DIAGNOSIS — N2581 Secondary hyperparathyroidism of renal origin: Secondary | ICD-10-CM | POA: Diagnosis not present

## 2014-11-17 DIAGNOSIS — D631 Anemia in chronic kidney disease: Secondary | ICD-10-CM | POA: Diagnosis not present

## 2014-11-17 DIAGNOSIS — N186 End stage renal disease: Secondary | ICD-10-CM | POA: Diagnosis not present

## 2014-11-17 DIAGNOSIS — Z992 Dependence on renal dialysis: Secondary | ICD-10-CM | POA: Diagnosis not present

## 2014-11-17 DIAGNOSIS — E1129 Type 2 diabetes mellitus with other diabetic kidney complication: Secondary | ICD-10-CM | POA: Diagnosis not present

## 2014-11-20 DIAGNOSIS — S91309A Unspecified open wound, unspecified foot, initial encounter: Secondary | ICD-10-CM | POA: Diagnosis not present

## 2014-11-20 DIAGNOSIS — E1129 Type 2 diabetes mellitus with other diabetic kidney complication: Secondary | ICD-10-CM | POA: Diagnosis not present

## 2014-11-20 DIAGNOSIS — D509 Iron deficiency anemia, unspecified: Secondary | ICD-10-CM | POA: Diagnosis not present

## 2014-11-20 DIAGNOSIS — N2581 Secondary hyperparathyroidism of renal origin: Secondary | ICD-10-CM | POA: Diagnosis not present

## 2014-11-20 DIAGNOSIS — N186 End stage renal disease: Secondary | ICD-10-CM | POA: Diagnosis not present

## 2014-11-20 DIAGNOSIS — D631 Anemia in chronic kidney disease: Secondary | ICD-10-CM | POA: Diagnosis not present

## 2014-11-22 DIAGNOSIS — N186 End stage renal disease: Secondary | ICD-10-CM | POA: Diagnosis not present

## 2014-11-22 DIAGNOSIS — N2581 Secondary hyperparathyroidism of renal origin: Secondary | ICD-10-CM | POA: Diagnosis not present

## 2014-11-22 DIAGNOSIS — E1129 Type 2 diabetes mellitus with other diabetic kidney complication: Secondary | ICD-10-CM | POA: Diagnosis not present

## 2014-11-22 DIAGNOSIS — D631 Anemia in chronic kidney disease: Secondary | ICD-10-CM | POA: Diagnosis not present

## 2014-11-22 DIAGNOSIS — D509 Iron deficiency anemia, unspecified: Secondary | ICD-10-CM | POA: Diagnosis not present

## 2014-11-22 DIAGNOSIS — S91309A Unspecified open wound, unspecified foot, initial encounter: Secondary | ICD-10-CM | POA: Diagnosis not present

## 2014-11-24 DIAGNOSIS — D509 Iron deficiency anemia, unspecified: Secondary | ICD-10-CM | POA: Diagnosis not present

## 2014-11-24 DIAGNOSIS — D631 Anemia in chronic kidney disease: Secondary | ICD-10-CM | POA: Diagnosis not present

## 2014-11-24 DIAGNOSIS — E1129 Type 2 diabetes mellitus with other diabetic kidney complication: Secondary | ICD-10-CM | POA: Diagnosis not present

## 2014-11-24 DIAGNOSIS — N186 End stage renal disease: Secondary | ICD-10-CM | POA: Diagnosis not present

## 2014-11-24 DIAGNOSIS — S91309A Unspecified open wound, unspecified foot, initial encounter: Secondary | ICD-10-CM | POA: Diagnosis not present

## 2014-11-24 DIAGNOSIS — N2581 Secondary hyperparathyroidism of renal origin: Secondary | ICD-10-CM | POA: Diagnosis not present

## 2014-11-27 DIAGNOSIS — N186 End stage renal disease: Secondary | ICD-10-CM | POA: Diagnosis not present

## 2014-11-27 DIAGNOSIS — S91309A Unspecified open wound, unspecified foot, initial encounter: Secondary | ICD-10-CM | POA: Diagnosis not present

## 2014-11-27 DIAGNOSIS — D509 Iron deficiency anemia, unspecified: Secondary | ICD-10-CM | POA: Diagnosis not present

## 2014-11-27 DIAGNOSIS — E1129 Type 2 diabetes mellitus with other diabetic kidney complication: Secondary | ICD-10-CM | POA: Diagnosis not present

## 2014-11-27 DIAGNOSIS — N2581 Secondary hyperparathyroidism of renal origin: Secondary | ICD-10-CM | POA: Diagnosis not present

## 2014-11-27 DIAGNOSIS — D631 Anemia in chronic kidney disease: Secondary | ICD-10-CM | POA: Diagnosis not present

## 2014-11-28 DIAGNOSIS — N186 End stage renal disease: Secondary | ICD-10-CM | POA: Diagnosis not present

## 2014-11-28 DIAGNOSIS — S91302D Unspecified open wound, left foot, subsequent encounter: Secondary | ICD-10-CM | POA: Diagnosis not present

## 2014-11-28 DIAGNOSIS — E1122 Type 2 diabetes mellitus with diabetic chronic kidney disease: Secondary | ICD-10-CM | POA: Diagnosis not present

## 2014-11-28 DIAGNOSIS — S91301D Unspecified open wound, right foot, subsequent encounter: Secondary | ICD-10-CM | POA: Diagnosis not present

## 2014-11-29 DIAGNOSIS — N2581 Secondary hyperparathyroidism of renal origin: Secondary | ICD-10-CM | POA: Diagnosis not present

## 2014-11-29 DIAGNOSIS — D631 Anemia in chronic kidney disease: Secondary | ICD-10-CM | POA: Diagnosis not present

## 2014-11-29 DIAGNOSIS — E1129 Type 2 diabetes mellitus with other diabetic kidney complication: Secondary | ICD-10-CM | POA: Diagnosis not present

## 2014-11-29 DIAGNOSIS — D509 Iron deficiency anemia, unspecified: Secondary | ICD-10-CM | POA: Diagnosis not present

## 2014-11-29 DIAGNOSIS — N186 End stage renal disease: Secondary | ICD-10-CM | POA: Diagnosis not present

## 2014-11-29 DIAGNOSIS — S91309A Unspecified open wound, unspecified foot, initial encounter: Secondary | ICD-10-CM | POA: Diagnosis not present

## 2014-12-01 DIAGNOSIS — D631 Anemia in chronic kidney disease: Secondary | ICD-10-CM | POA: Diagnosis not present

## 2014-12-01 DIAGNOSIS — E1129 Type 2 diabetes mellitus with other diabetic kidney complication: Secondary | ICD-10-CM | POA: Diagnosis not present

## 2014-12-01 DIAGNOSIS — N186 End stage renal disease: Secondary | ICD-10-CM | POA: Diagnosis not present

## 2014-12-01 DIAGNOSIS — N2581 Secondary hyperparathyroidism of renal origin: Secondary | ICD-10-CM | POA: Diagnosis not present

## 2014-12-01 DIAGNOSIS — D509 Iron deficiency anemia, unspecified: Secondary | ICD-10-CM | POA: Diagnosis not present

## 2014-12-01 DIAGNOSIS — S91309A Unspecified open wound, unspecified foot, initial encounter: Secondary | ICD-10-CM | POA: Diagnosis not present

## 2014-12-04 ENCOUNTER — Inpatient Hospital Stay (HOSPITAL_COMMUNITY)
Admission: EM | Admit: 2014-12-04 | Discharge: 2014-12-07 | DRG: 638 | Disposition: A | Discharge status: Home Health | Payer: Medicare Other | Attending: Internal Medicine | Admitting: Internal Medicine

## 2014-12-04 ENCOUNTER — Other Ambulatory Visit (HOSPITAL_COMMUNITY): Payer: Self-pay

## 2014-12-04 ENCOUNTER — Encounter (HOSPITAL_COMMUNITY): Payer: Self-pay | Admitting: *Deleted

## 2014-12-04 DIAGNOSIS — Z794 Long term (current) use of insulin: Secondary | ICD-10-CM | POA: Diagnosis not present

## 2014-12-04 DIAGNOSIS — N2581 Secondary hyperparathyroidism of renal origin: Secondary | ICD-10-CM | POA: Diagnosis present

## 2014-12-04 DIAGNOSIS — I70243 Atherosclerosis of native arteries of left leg with ulceration of ankle: Secondary | ICD-10-CM | POA: Diagnosis not present

## 2014-12-04 DIAGNOSIS — L97513 Non-pressure chronic ulcer of other part of right foot with necrosis of muscle: Secondary | ICD-10-CM | POA: Diagnosis present

## 2014-12-04 DIAGNOSIS — M1990 Unspecified osteoarthritis, unspecified site: Secondary | ICD-10-CM | POA: Diagnosis present

## 2014-12-04 DIAGNOSIS — L97519 Non-pressure chronic ulcer of other part of right foot with unspecified severity: Secondary | ICD-10-CM | POA: Diagnosis present

## 2014-12-04 DIAGNOSIS — I70235 Atherosclerosis of native arteries of right leg with ulceration of other part of foot: Secondary | ICD-10-CM | POA: Diagnosis not present

## 2014-12-04 DIAGNOSIS — E785 Hyperlipidemia, unspecified: Secondary | ICD-10-CM | POA: Diagnosis present

## 2014-12-04 DIAGNOSIS — E11621 Type 2 diabetes mellitus with foot ulcer: Secondary | ICD-10-CM | POA: Diagnosis not present

## 2014-12-04 DIAGNOSIS — Z992 Dependence on renal dialysis: Secondary | ICD-10-CM

## 2014-12-04 DIAGNOSIS — E119 Type 2 diabetes mellitus without complications: Secondary | ICD-10-CM

## 2014-12-04 DIAGNOSIS — I120 Hypertensive chronic kidney disease with stage 5 chronic kidney disease or end stage renal disease: Secondary | ICD-10-CM | POA: Diagnosis present

## 2014-12-04 DIAGNOSIS — S91309A Unspecified open wound, unspecified foot, initial encounter: Secondary | ICD-10-CM | POA: Diagnosis not present

## 2014-12-04 DIAGNOSIS — L97523 Non-pressure chronic ulcer of other part of left foot with necrosis of muscle: Secondary | ICD-10-CM

## 2014-12-04 DIAGNOSIS — D509 Iron deficiency anemia, unspecified: Secondary | ICD-10-CM | POA: Diagnosis not present

## 2014-12-04 DIAGNOSIS — D631 Anemia in chronic kidney disease: Secondary | ICD-10-CM | POA: Diagnosis not present

## 2014-12-04 DIAGNOSIS — L97529 Non-pressure chronic ulcer of other part of left foot with unspecified severity: Secondary | ICD-10-CM | POA: Diagnosis present

## 2014-12-04 DIAGNOSIS — D649 Anemia, unspecified: Secondary | ICD-10-CM | POA: Diagnosis present

## 2014-12-04 DIAGNOSIS — E1129 Type 2 diabetes mellitus with other diabetic kidney complication: Secondary | ICD-10-CM | POA: Diagnosis not present

## 2014-12-04 DIAGNOSIS — I10 Essential (primary) hypertension: Secondary | ICD-10-CM | POA: Diagnosis present

## 2014-12-04 DIAGNOSIS — N189 Chronic kidney disease, unspecified: Secondary | ICD-10-CM

## 2014-12-04 DIAGNOSIS — D638 Anemia in other chronic diseases classified elsewhere: Secondary | ICD-10-CM | POA: Diagnosis present

## 2014-12-04 DIAGNOSIS — E780 Pure hypercholesterolemia: Secondary | ICD-10-CM | POA: Diagnosis present

## 2014-12-04 DIAGNOSIS — B999 Unspecified infectious disease: Secondary | ICD-10-CM | POA: Diagnosis not present

## 2014-12-04 DIAGNOSIS — N186 End stage renal disease: Secondary | ICD-10-CM | POA: Diagnosis present

## 2014-12-04 DIAGNOSIS — L97909 Non-pressure chronic ulcer of unspecified part of unspecified lower leg with unspecified severity: Secondary | ICD-10-CM | POA: Diagnosis not present

## 2014-12-04 HISTORY — DX: End stage renal disease: Z99.2

## 2014-12-04 HISTORY — DX: End stage renal disease: N18.6

## 2014-12-04 LAB — CBC WITH DIFFERENTIAL/PLATELET
Basophils Absolute: 0.1 10*3/uL (ref 0.0–0.1)
Basophils Relative: 1 % (ref 0–1)
EOS ABS: 0.1 10*3/uL (ref 0.0–0.7)
Eosinophils Relative: 1 % (ref 0–5)
HEMATOCRIT: 31.4 % — AB (ref 36.0–46.0)
Hemoglobin: 9.7 g/dL — ABNORMAL LOW (ref 12.0–15.0)
LYMPHS PCT: 15 % (ref 12–46)
Lymphs Abs: 1.6 10*3/uL (ref 0.7–4.0)
MCH: 28.9 pg (ref 26.0–34.0)
MCHC: 30.9 g/dL (ref 30.0–36.0)
MCV: 93.5 fL (ref 78.0–100.0)
MONOS PCT: 9 % (ref 3–12)
Monocytes Absolute: 1 10*3/uL (ref 0.1–1.0)
Neutro Abs: 8.3 10*3/uL — ABNORMAL HIGH (ref 1.7–7.7)
Neutrophils Relative %: 74 % (ref 43–77)
Platelets: 297 10*3/uL (ref 150–400)
RBC: 3.36 MIL/uL — ABNORMAL LOW (ref 3.87–5.11)
RDW: 14.7 % (ref 11.5–15.5)
WBC: 11.1 10*3/uL — AB (ref 4.0–10.5)

## 2014-12-04 LAB — COMPREHENSIVE METABOLIC PANEL
ALK PHOS: 67 U/L (ref 38–126)
ALT: 10 U/L — AB (ref 14–54)
AST: 19 U/L (ref 15–41)
Albumin: 2.9 g/dL — ABNORMAL LOW (ref 3.5–5.0)
Anion gap: 14 (ref 5–15)
BILIRUBIN TOTAL: 0.5 mg/dL (ref 0.3–1.2)
BUN: 7 mg/dL (ref 6–20)
CO2: 33 mmol/L — ABNORMAL HIGH (ref 22–32)
Calcium: 8.4 mg/dL — ABNORMAL LOW (ref 8.9–10.3)
Chloride: 91 mmol/L — ABNORMAL LOW (ref 101–111)
Creatinine, Ser: 2.71 mg/dL — ABNORMAL HIGH (ref 0.44–1.00)
GFR, EST AFRICAN AMERICAN: 18 mL/min — AB (ref >60)
GFR, EST NON AFRICAN AMERICAN: 16 mL/min — AB (ref >60)
Glucose, Bld: 106 mg/dL — ABNORMAL HIGH (ref 65–99)
POTASSIUM: 3.1 mmol/L — AB (ref 3.5–5.1)
Sodium: 138 mmol/L (ref 135–145)
Total Protein: 8.5 g/dL — ABNORMAL HIGH (ref 6.5–8.1)

## 2014-12-04 LAB — TROPONIN I: Troponin I: 0.03 ng/mL (ref <0.031)

## 2014-12-04 LAB — I-STAT CG4 LACTIC ACID, ED: Lactic Acid, Venous: 1.18 mmol/L (ref 0.5–2.0)

## 2014-12-04 LAB — GLUCOSE, CAPILLARY: Glucose-Capillary: 91 mg/dL (ref 65–99)

## 2014-12-04 MED ORDER — VANCOMYCIN HCL 10 G IV SOLR
1250.0000 mg | Freq: Once | INTRAVENOUS | Status: AC
  Administered 2014-12-05: 1250 mg via INTRAVENOUS
  Filled 2014-12-04 (×2): qty 1250

## 2014-12-04 MED ORDER — SODIUM CHLORIDE 0.9 % IJ SOLN
3.0000 mL | Freq: Two times a day (BID) | INTRAMUSCULAR | Status: DC
  Administered 2014-12-04 – 2014-12-06 (×4): 3 mL via INTRAVENOUS

## 2014-12-04 MED ORDER — PREGABALIN 50 MG PO CAPS
50.0000 mg | ORAL_CAPSULE | Freq: Three times a day (TID) | ORAL | Status: DC
  Administered 2014-12-04 – 2014-12-06 (×4): 50 mg via ORAL
  Filled 2014-12-04 (×3): qty 1

## 2014-12-04 MED ORDER — ACETAMINOPHEN 650 MG RE SUPP: 650.0000 mg | Freq: Four times a day (QID) | RECTAL | Status: DC | PRN

## 2014-12-04 MED ORDER — PIPERACILLIN-TAZOBACTAM IN DEX 2-0.25 GM/50ML IV SOLN
2.2500 g | Freq: Three times a day (TID) | INTRAVENOUS | Status: DC
  Administered 2014-12-04 – 2014-12-07 (×7): 2.25 g via INTRAVENOUS
  Filled 2014-12-04 (×12): qty 50

## 2014-12-04 MED ORDER — HYDROCODONE-ACETAMINOPHEN 5-325 MG PO TABS: 1.0000 | ORAL_TABLET | ORAL | Status: DC | PRN

## 2014-12-04 MED ORDER — ONDANSETRON HCL 4 MG/2ML IJ SOLN: 4.0000 mg | Freq: Four times a day (QID) | INTRAMUSCULAR | Status: DC | PRN

## 2014-12-04 MED ORDER — INSULIN DETEMIR 100 UNIT/ML ~~LOC~~ SOLN
5.0000 [IU] | Freq: Every day | SUBCUTANEOUS | Status: DC
  Administered 2014-12-04 – 2014-12-06 (×3): 5 [IU] via SUBCUTANEOUS
  Filled 2014-12-04 (×6): qty 0.05

## 2014-12-04 MED ORDER — ONDANSETRON HCL 4 MG PO TABS: 4.0000 mg | ORAL_TABLET | Freq: Four times a day (QID) | ORAL | Status: DC | PRN

## 2014-12-04 MED ORDER — ALBUTEROL SULFATE HFA 108 (90 BASE) MCG/ACT IN AERS: 1.0000 | INHALATION_SPRAY | Freq: Four times a day (QID) | RESPIRATORY_TRACT | Status: DC | PRN

## 2014-12-04 MED ORDER — MORPHINE SULFATE 2 MG/ML IJ SOLN: 2.0000 mg | INTRAMUSCULAR | Status: DC | PRN

## 2014-12-04 MED ORDER — ENOXAPARIN SODIUM 30 MG/0.3ML ~~LOC~~ SOLN
30.0000 mg | SUBCUTANEOUS | Status: DC
Start: 2014-12-04 — End: 2014-12-05
  Administered 2014-12-04: 30 mg via SUBCUTANEOUS
  Filled 2014-12-04 (×2): qty 0.3

## 2014-12-04 MED ORDER — VANCOMYCIN HCL 500 MG IV SOLR
500.0000 mg | INTRAVENOUS | Status: DC
  Administered 2014-12-06: 500 mg via INTRAVENOUS
  Filled 2014-12-04 (×3): qty 500

## 2014-12-04 MED ORDER — LANTHANUM CARBONATE 500 MG PO CHEW
1000.0000 mg | CHEWABLE_TABLET | Freq: Three times a day (TID) | ORAL | Status: DC
  Administered 2014-12-05 – 2014-12-06 (×4): 1000 mg via ORAL
  Filled 2014-12-04 (×9): qty 2

## 2014-12-04 MED ORDER — INSULIN ASPART 100 UNIT/ML ~~LOC~~ SOLN
0.0000 [IU] | SUBCUTANEOUS | Status: DC
  Administered 2014-12-05: 3 [IU] via SUBCUTANEOUS
  Administered 2014-12-05: 2 [IU] via SUBCUTANEOUS
  Administered 2014-12-06: 1 [IU] via SUBCUTANEOUS

## 2014-12-04 MED ORDER — ACETAMINOPHEN 325 MG PO TABS
650.0000 mg | ORAL_TABLET | Freq: Four times a day (QID) | ORAL | Status: DC | PRN
  Administered 2014-12-04: 650 mg via ORAL
  Filled 2014-12-04: qty 2

## 2014-12-04 NOTE — ED Notes (Signed)
Admitting at bedside 

## 2014-12-04 NOTE — Progress Notes (Signed)
ANTIBIOTIC CONSULT NOTE - INITIAL  Pharmacy Consult for zosyn and vancomycin Indication: wound infection  No Known Allergies  Patient Measurements: Height: 5\' 8"  (172.7 cm) Weight: 125 lb 6.4 oz (56.881 kg) IBW/kg (Calculated) : 63.9   Vital Signs: Temp: 99.5 F (37.5 C) (05/17 2127) Temp Source: Oral (05/17 2127) BP: 162/53 mmHg (05/17 2127) Pulse Rate: 84 (05/17 2127) Intake/Output from previous day:   Intake/Output from this shift:    Labs:  Recent Labs  12/04/14 1810  WBC 11.1*  HGB 9.7*  PLT 297  CREATININE 2.71*   Estimated Creatinine Clearance: 15.4 mL/min (by C-G formula based on Cr of 2.71). No results for input(s): VANCOTROUGH, VANCOPEAK, VANCORANDOM, GENTTROUGH, GENTPEAK, GENTRANDOM, TOBRATROUGH, TOBRAPEAK, TOBRARND, AMIKACINPEAK, AMIKACINTROU, AMIKACIN in the last 72 hours.   Microbiology: No results found for this or any previous visit (from the past 720 hour(s)).  Medical History: Past Medical History  Diagnosis Date  . Hypertension   . Diabetes mellitus   . Chronic kidney disease   . Thyroid disease   . Anemia   . Hyperlipidemia   . Arthritis     Medications:  Prescriptions prior to admission  Medication Sig Dispense Refill Last Dose  . albuterol (PROVENTIL HFA;VENTOLIN HFA) 108 (90 BASE) MCG/ACT inhaler Inhale 1-2 puffs into the lungs every 6 (six) hours as needed for wheezing. 1 Inhaler 0 Past Month at Unknown time  . B Complex-C-Folic Acid (B COMPLEX-VITAMIN C-FOLIC ACID) 1 MG tablet Take 1 tablet by mouth daily with breakfast.   12/03/2014 at Unknown time  . FOSRENOL 1000 MG chewable tablet Chew 1,000 mg by mouth 3 (three) times daily with meals.    12/03/2014 at Unknown time  . insulin aspart (NOVOLOG) 100 UNIT/ML injection Inject 5 Units into the skin daily as needed for high blood sugar.    12/03/2014 at Unknown time  . insulin detemir (LEVEMIR) 100 UNIT/ML injection Inject 15 Units into the skin at bedtime.   12/03/2014 at Unknown time  .  IRON PO Take 1 tablet by mouth daily.   12/03/2014 at Unknown time  . pregabalin (LYRICA) 50 MG capsule Take 50 mg by mouth 3 (three) times daily as needed. For nerve pain in feet per patient   Past Week at Unknown time  . silver sulfADIAZINE (SILVADENE) 1 % cream Apply 1 application topically daily. 50 g 0 12/03/2014 at Unknown time  . oxyCODONE (ROXICODONE) 5 MG immediate release tablet Take 1 tablet (5 mg total) by mouth every 4 (four) hours as needed for pain. (Patient not taking: Reported on 12/04/2014) 50 tablet 0 Not Taking at Unknown time   Assessment: 80 yo F with B foot ulcers.  Pharmacy consulted to dose zosyn and vancomycin for wound infection.  Wt 56.9 kg, ESRD on TTS HD.  WBC 11.1, Temp 99.5, lactic acid WNL 5/17 wound cx>>  Zosyn 5/17>> vanc 5/17>>   Goal of Therapy:  Pre-HD vancomycin level 15-25 mcg/ml  Plan:  -zosyn 2.25 gm IV q8h -vancomycin 1250mg  IV loading dose, then vancomycin 500 mg IV after HD on TTS -pre-HD vanc levels as needed  Herby AbrahamMichelle T. Patricia Perales, Pharm.D. [phone] 12/04/2014 9:43 PM

## 2014-12-04 NOTE — Consult Note (Signed)
Consult Note  Patient name: Jackie [name] MRN: [id number] DOB: 09/26/1934 Sex: female  Consulting Physician:  ER  Reason for Consult:  Chief Complaint  Patient presents with  . Skin Problem    HISTORY OF PRESENT ILLNESS: This is a 80 year old female with end stage renal disease on HD T,TH, SAt.  Patient reports waking up on April 244 and noticing blisters on both feet.  She popped them the following day.  They have continued to drain.  She saw her PCP about 2 weeks ago who prescribed silvadene and referred her to the wound center.  She feels they have been getting worse over the past few days with foul smelling drainage.  She denies fevers or chils  She is medically managed for hypertension.  She has Type 2 DM.  There are no recent Hb A1c.  The last I can see is 6.4 several years ago.  She has hypercholesterolemia but is not on a statin.  She is a non smoker.    Past Medical History  Diagnosis Date  . Hypertension   . Diabetes mellitus   . Chronic kidney disease   . Thyroid disease   . Anemia   . Hyperlipidemia   . Arthritis     Past Surgical History  Procedure Laterality Date  . Arteriovenous graft placement Left     non function  . Eye surgery Bilateral     catarct  . Cesarean section    . Av fistula placement Right 01/30/2013    Procedure: ARTERIOVENOUS (AV) FISTULA CREATION;  Surgeon: Fransisco HertzBrian L Chen, MD;  [facility];  Service: Vascular;  Laterality: Right;  . Insertion of dialysis catheter Right 04/24/2013    Procedure: INSERTION OF DIALYSIS CATHETER;  Surgeon: Larina Earthlyodd F Early, MD;  [facility];  Service: Vascular;  Laterality: Right;  . Fistula superficialization Right 04/24/2013    Procedure: FISTULA SUPERFICIALIZATION;  Surgeon: Larina Earthlyodd F Early, MD;  [facility];  Service: Vascular;  Laterality: Right;  . Fistulogram Right 07/26/2013    Procedure: FISTULOGRAM;  Surgeon: Larina Earthlyodd F Early, MD;  [facility];  Service: Cardiovascular;  Laterality:  Right;    History   Social History  . Marital Status: Widowed    Spouse Name: N/A  . Number of Children: N/A  . Years of Education: N/A   Occupational History  . Not on file.   Social History Main Topics  . Smoking status: Never Smoker   . Smokeless tobacco: Never Used  . Alcohol Use: No  . Drug Use: No  . Sexual Activity: Not on file   Other Topics Concern  . Not on file   Social History Narrative    Family History  Problem Relation Age of Onset  . Diabetes Mother   . Heart disease Mother   . Hypertension Mother     Allergies as of 12/04/2014  . (No Known Allergies)    No current facility-administered medications on file prior to encounter.   Current Outpatient Prescriptions on File Prior to Encounter  Medication Sig Dispense Refill  . albuterol (PROVENTIL HFA;VENTOLIN HFA) 108 (90 BASE) MCG/ACT inhaler Inhale 1-2 puffs into the lungs every 6 (six) hours as needed for wheezing. 1 Inhaler 0  . B Complex-C-Folic Acid (B COMPLEX-VITAMIN C-FOLIC ACID) 1 MG tablet Take 1 tablet by mouth daily with breakfast.    . FOSRENOL 1000 MG chewable tablet Chew 1,000 mg by mouth 3 (three) times  daily with meals.     . insulin aspart (NOVOLOG) 100 UNIT/ML injection Inject 5 Units into the skin daily as needed for high blood sugar.     . insulin detemir (LEVEMIR) 100 UNIT/ML injection Inject 15 Units into the skin at bedtime.    . IRON PO Take 1 tablet by mouth daily.    . pregabalin (LYRICA) 50 MG capsule Take 50 mg by mouth 3 (three) times daily as needed. For nerve pain in feet per patient    . silver sulfADIAZINE (SILVADENE) 1 % cream Apply 1 application topically daily. 50 g 0  . oxyCODONE (ROXICODONE) 5 MG immediate release tablet Take 1 tablet (5 mg total) by mouth every 4 (four) hours as needed for pain. (Patient not taking: Reported on 12/04/2014) 50 tablet 0     REVIEW OF SYSTEMS: Cardiovascular: No chest pain, chest pressure, palpitations, orthopnea, or dyspnea on  exertion. No claudication or rest pain,  No history of DVT or phlebitis. Pulmonary: No productive cough, asthma or wheezing. Neurologic: No weakness, paresthesias, aphasia, or amaurosis. No dizziness. Hematologic: No bleeding problems or clotting disorders. Musculoskeletal: No joint pain or joint swelling. Gastrointestinal: No blood in stool or hematemesis Genitourinary: No dysuria or hematuria. Psychiatric:: No history of major depression. Integumentary: See HPI Constitutional: No fever or chills.  PHYSICAL EXAMINATION: General: The patient appears their stated age.  Vital signs are BP 161/57 mmHg  Pulse 79  Temp(Src) 99.3 F (37.4 C) (Oral)  Resp 18  Ht 5\' 8"  (1.727 m)  Wt 130 lb 15.3 oz (59.4 kg)  BMI 19.92 kg/m2  SpO2 100% Pulmonary: Respirations are non-labored HEENT:  No gross abnormalities Abdomen: Soft and non-tender  Musculoskeletal: There are no major deformities.   Neurologic: No focal weakness or paresthesias are detected, Skin: Large ulcers on bilateral lower extremities.  The left is more extensive with the tissue loss extending up to the ankle.  There is exposed tendon.  On the right it involves the middle 3 toes.  There is some phallus odor associated.Marland Kitchen. Psychiatric: The patient has normal affect. Cardiovascular: There is a regular rate and rhythm without significant murmur appreciated.  Palpable femoral pulses  Diagnostic Studies: ABI's ordered    Assessment:  Bilateral lower extremity ulcers Plan: This is clearly a limb threatening situation.  She will need angiography to determine if she has any options for revascularization.  She clearly has necrotic tissue and will require some form of a dictation on both feet.  I did discuss this with her and she was obviously very emotional.  I will try to get her angiogram done either today or tomorrow.  If any intervention can be done we would proceed.  Otherwise would just do wound care.  Consult the wound service but  Silvadene will likely be appropriate for both wounds     V. Charlena CrossWells Brabham IV, M.D. [facility] Office: [phone] Pager:  [phone]

## 2014-12-04 NOTE — ED Provider Notes (Signed)
CSN: [id number]     Arrival date & time 12/04/14  1648 History   First MD Initiated Contact with Patient 12/04/14 1652     Chief Complaint  Patient presents with  . Skin Problem     The history is provided by the patient. No language interpreter was used.   [name] presents for evaluation of foot wounds.  She woke up on April 24 with blisters to the backs of both of her feet.  She denies any preceding problems or injuries.  On April 25 she popped the blisters.  They were draining since then.  She saw her family doctor about two weeks ago and was started on silvadene cream and was referred to the wound clinic.  She was lost to follow up with the wound center.  She receives dialysis on Tuesday and Thursday.  She feels like they started getting worse several days ago and developed malaise over the weekend. They developed a foul odor a few days ago.  She was at dialysis today and was referred to the ED for further evaluation when they examined her feet.  She denies any fevers, vomiting, nausea, diarrhea.  She lives alone.  Sxs are moderate, constant, worsening.    Past Medical History  Diagnosis Date  . Hypertension   . Diabetes mellitus   . Chronic kidney disease   . Thyroid disease   . Anemia   . Hyperlipidemia   . Arthritis    Past Surgical History  Procedure Laterality Date  . Arteriovenous graft placement Left     non function  . Eye surgery Bilateral     catarct  . Cesarean section    . Av fistula placement Right 01/30/2013    Procedure: ARTERIOVENOUS (AV) FISTULA CREATION;  Surgeon: Fransisco HertzBrian L Chen, MD;  [facility];  Service: Vascular;  Laterality: Right;  . Insertion of dialysis catheter Right 04/24/2013    Procedure: INSERTION OF DIALYSIS CATHETER;  Surgeon: Larina Earthlyodd F Early, MD;  [facility];  Service: Vascular;  Laterality: Right;  . Fistula superficialization Right 04/24/2013    Procedure: FISTULA SUPERFICIALIZATION;  Surgeon: Larina Earthlyodd F Early, MD;  [facility];   Service: Vascular;  Laterality: Right;  . Fistulogram Right 07/26/2013    Procedure: FISTULOGRAM;  Surgeon: Larina Earthlyodd F Early, MD;  [facility];  Service: Cardiovascular;  Laterality: Right;   Family History  Problem Relation Age of Onset  . Diabetes Mother   . Heart disease Mother   . Hypertension Mother    History  Substance Use Topics  . Smoking status: Never Smoker   . Smokeless tobacco: Never Used  . Alcohol Use: No   OB History    No data available     Review of Systems  All other systems reviewed and are negative.     Allergies  Review of patient's allergies indicates no known allergies.  Home Medications   Prior to Admission medications   Medication Sig Start Date End Date Taking? Authorizing Provider  albuterol (PROVENTIL HFA;VENTOLIN HFA) 108 (90 BASE) MCG/ACT inhaler Inhale 1-2 puffs into the lungs every 6 (six) hours as needed for wheezing. 12/27/12   April Palumbo, MD  Aspirin Effervescent (ALKA-SELTZER PO) Take 1 tablet by mouth as needed. For indigestion and heartburn    Historical Provider, MD  B Complex-C-Folic Acid (B COMPLEX-VITAMIN C-FOLIC ACID) 1 MG tablet Take 1 tablet by mouth daily with breakfast.    Historical Provider, MD  FOSRENOL 1000 MG chewable tablet Chew  1,000 mg by mouth 3 (three) times daily with meals.  01/18/13   Historical Provider, MD  insulin aspart (NOVOLOG) 100 UNIT/ML injection Inject 5 Units into the skin daily as needed for high blood sugar.     Historical Provider, MD  insulin detemir (LEVEMIR) 100 UNIT/ML injection Inject 15 Units into the skin at bedtime.    Historical Provider, MD  IRON PO Take 1 tablet by mouth daily.    Historical Provider, MD  Melatonin 3 MG CAPS Take 1 capsule by mouth at bedtime as needed (sleep).     Historical Provider, MD  metoprolol (LOPRESSOR) 50 MG tablet Take 50 mg by mouth 2 (two) times daily.    Historical Provider, MD  mometasone (NASONEX) 50 MCG/ACT nasal spray Place 2 sprays into the nose daily  as needed. For nasal congestion    Historical Provider, MD  oxyCODONE (ROXICODONE) 5 MG immediate release tablet Take 1 tablet (5 mg total) by mouth every 4 (four) hours as needed for pain. 04/26/13   Hollice Espy, MD  pregabalin (LYRICA) 50 MG capsule Take 50 mg by mouth 3 (three) times daily.     Historical Provider, MD  silver sulfADIAZINE (SILVADENE) 1 % cream Apply 1 application topically daily. 11/12/14   Hayden Rasmussen, NP   BP 129/53 mmHg  Pulse 82  Temp(Src) 99.3 F (37.4 C) (Oral)  Ht  (1.727 m)  Wt 130 lb 15.3 oz (59.4 kg)  BMI 19.92 kg/m2  SpO2 100% Physical Exam  Constitutional: She is oriented to person, place, and time. She appears well-developed and well-nourished.  HENT:  Head: Normocephalic and atraumatic.  Cardiovascular: Normal rate and regular rhythm.   No murmur heard. Pulmonary/Chest: Effort normal and breath sounds normal. No respiratory distress.  Abdominal: Soft. There is no tenderness. There is no rebound and no guarding.  Musculoskeletal:  Fistula in RUE with palpable thrill.  Right distal foot with necrotic ulcerations to dorsal, distal foot from first metatarsal to fourth.  Left dorsal foot with ulceration from first metatarsal to third metastarsal to ankle. Necrotic changes to the foot with sloughing of skin surrounding ulcerations on bilateral feet.  dopplerable PT, DP pulses on RLE.  LLE with faintly dopplerable DP and PT pulses.  Neurological: She is alert and oriented to person, place, and time.  Skin: Skin is warm and dry.  Psychiatric: She has a normal mood and affect. Her behavior is normal.  Nursing note and vitals reviewed.   ED Course  Procedures (including critical care time) Labs Review Labs Reviewed  COMPREHENSIVE METABOLIC PANEL - Abnormal; Notable for the following:    Potassium 3.1 (*)    Chloride 91 (*)    CO2 33 (*)    Glucose, Bld 106 (*)    Creatinine, Ser 2.71 (*)    Calcium 8.4 (*)    Total Protein 8.5 (*)    Albumin  2.9 (*)    ALT 10 (*)    GFR calc non Af Amer 16 (*)    GFR calc Af Amer 18 (*)    All other components within normal limits  CBC WITH DIFFERENTIAL/PLATELET - Abnormal; Notable for the following:    WBC 11.1 (*)    RBC 3.36 (*)    Hemoglobin 9.7 (*)    HCT 31.4 (*)    Neutro Abs 8.3 (*)    All other components within normal limits  MRSA PCR SCREENING  WOUND CULTURE  TROPONIN I  GLUCOSE, CAPILLARY  HEMOGLOBIN A1C  MAGNESIUM  PHOSPHORUS  TSH  COMPREHENSIVE METABOLIC PANEL  CBC  TROPONIN I  TROPONIN I  I-STAT CG4 LACTIC ACID, ED    Imaging Review No results found.   EKG Interpretation None      MDM   Final diagnoses:  Skin ulcers of foot, bilateral    Pt here for evaluation of foot wounds.  Examination with necrotic ulcers of her feet, treating for possible infection with IV abx.  Concern for chronic limb ischemia with necrotic foot wounds - discussed with Dr. Myra GianottiBrabham with Vascular Surgery - recommends checking ABIs, will see the patient in consult.  Plan to admit to the medicine service for IV abx.  Pt updated regarding need for admission and further evaluation, possible amputation.      Tilden FossaElizabeth Briasia Flinders, MD 12/05/14 [phone]

## 2014-12-04 NOTE — ED Notes (Signed)
Dr. Rees at bedside  

## 2014-12-04 NOTE — H&P (Addendum)
PCP:  Evlyn CourierHILL,GERALD K, MD  Vascular Tawanna Coolerodd [facility] Briant CedarMattingly, Detredine  Referring provider  Pecola Leisureeese Consultant: Myra GianottiBrabham   Chief Complaint:  Bilateral foot ulcers  HPI: Jackie [name] is a 80 y.o. female   has a past medical history of Hypertension; Diabetes mellitus; Chronic kidney disease; Thyroid disease; Anemia; Hyperlipidemia; and Arthritis.   Presented with blisters on bilateral extremities  On 4/25 she was seen for this in Urgent care and was prescribed Silvadene dressing. Patient has popped the blisters with a needle and started to have foul order discharge. She was seen by PCP and was referred to wound care clinic but for some reason did not follow up. She reports not feeling well. Denies any fever or pain. Denies any chest pain. She walks with a walker denies any leg pain with ambulation.  Patient was sent from HD center to ER today.  Vascular surgery has been consulted Dr. Myra GianottiBrabham recommends ABI in AM and he will see patient in consult may need OR angiogram   Of note patient with ESRD on HD on Tuesday, Thursday Saturday [address]. She has been on HD for 2.5 years. She still urinates about 20 Oz a day.  She is diabetic on levemir 15 units a day reports good control.   Hospitalist was called for admission for Bilateral foot ulcer  Review of Systems:    Pertinent positives include  fatigue  Constitutional:  No weight loss, night sweats, Fevers, chills,, weight loss  HEENT:  No headaches, Difficulty swallowing,Tooth/dental problems,Sore throat,  No sneezing, itching, ear ache, nasal congestion, post nasal drip,  Cardio-vascular:  No chest pain, Orthopnea, PND, anasarca, dizziness, palpitations.no Bilateral lower extremity swelling  GI:  No heartburn, indigestion, abdominal pain, nausea, vomiting, diarrhea, change in bowel habits, loss of appetite, melena, blood in stool, hematemesis Resp:  no shortness of breath at rest. No dyspnea on exertion, No excess  mucus, no productive cough, No non-productive cough, No coughing up of blood.No change in color of mucus.No wheezing. Skin:  no rash or lesions. No jaundice GU:  no dysuria, change in color of urine, no urgency or frequency. No straining to urinate.  No flank pain.  Musculoskeletal:  No joint pain or no joint swelling. No decreased range of motion. No back pain.  Psych:  No change in mood or affect. No depression or anxiety. No memory loss.  Neuro: no localizing neurological complaints, no tingling, no weakness, no double vision, no gait abnormality, no slurred speech, no confusion  Otherwise ROS are negative except for above, 10 systems were reviewed  Past Medical History: Past Medical History  Diagnosis Date  . Hypertension   . Diabetes mellitus   . Chronic kidney disease   . Thyroid disease   . Anemia   . Hyperlipidemia   . Arthritis    Past Surgical History  Procedure Laterality Date  . Arteriovenous graft placement Left     non function  . Eye surgery Bilateral     catarct  . Cesarean section    . Av fistula placement Right 01/30/2013    Procedure: ARTERIOVENOUS (AV) FISTULA CREATION;  Surgeon: Fransisco HertzBrian L Chen, MD;  [facility];  Service: Vascular;  Laterality: Right;  . Insertion of dialysis catheter Right 04/24/2013    Procedure: INSERTION OF DIALYSIS CATHETER;  Surgeon: Larina Earthlyodd F Early, MD;  [facility];  Service: Vascular;  Laterality: Right;  . Fistula superficialization Right 04/24/2013    Procedure: FISTULA SUPERFICIALIZATION;  Surgeon: Tawanna Coolerodd  Shirline Frees, MD;  Location: [facility] OR;  Service: Vascular;  Laterality: Right;  . Fistulogram Right 07/26/2013    Procedure: FISTULOGRAM;  Surgeon: Larina Earthly, MD;  Location: [facility] CATH LAB;  Service: Cardiovascular;  Laterality: Right;     Medications: Prior to Admission medications   Medication Sig Start Date End Date Taking? Authorizing Provider  albuterol (PROVENTIL HFA;VENTOLIN HFA) 108 (90 BASE) MCG/ACT inhaler Inhale 1-2 puffs  into the lungs every 6 (six) hours as needed for wheezing. 12/27/12  Yes April Palumbo, MD  B Complex-C-Folic Acid (B COMPLEX-VITAMIN C-FOLIC ACID) 1 MG tablet Take 1 tablet by mouth daily with breakfast.   Yes Historical Provider, MD  FOSRENOL 1000 MG chewable tablet Chew 1,000 mg by mouth 3 (three) times daily with meals.  01/18/13  Yes Historical Provider, MD  insulin aspart (NOVOLOG) 100 UNIT/ML injection Inject 5 Units into the skin daily as needed for high blood sugar.    Yes Historical Provider, MD  insulin detemir (LEVEMIR) 100 UNIT/ML injection Inject 15 Units into the skin at bedtime.   Yes Historical Provider, MD  IRON PO Take 1 tablet by mouth daily.   Yes Historical Provider, MD  pregabalin (LYRICA) 50 MG capsule Take 50 mg by mouth 3 (three) times daily as needed. For nerve pain in feet per patient   Yes Historical Provider, MD  silver sulfADIAZINE (SILVADENE) 1 % cream Apply 1 application topically daily. 11/12/14  Yes Hayden Rasmussen, NP  oxyCODONE (ROXICODONE) 5 MG immediate release tablet Take 1 tablet (5 mg total) by mouth every 4 (four) hours as needed for pain. Patient not taking: Reported on 12/04/2014 04/26/13   Hollice Espy, MD    Allergies:  No Known Allergies  Social History:  Ambulatory    Walker  Lives at home alone,       reports that she has never smoked. She has never used smokeless tobacco. She reports that she does not drink alcohol or use illicit drugs.    Family History: family history includes Diabetes in her mother; Heart disease in her mother; Hypertension in her mother.    Physical Exam: Patient Vitals for the past 24 hrs:  BP Temp Temp src Pulse SpO2 Height Weight  12/04/14 1745 156/70 mmHg - - - - - -  12/04/14 1730 152/57 mmHg - - - - - -  12/04/14 1715 131/55 mmHg - - 92 100 % - -  12/04/14 1700 (!) 139/37 mmHg - - 87 98 % - -  12/04/14 1653 - 99.3 F (37.4 C) Oral - -  (1.727 m) 59.4 kg (130 lb 15.3 oz)  12/04/14 1650 (!) 129/53 mmHg - -  82 100 % - -  12/04/14 1649 - - - - 99 % - -    1. General:  in No Acute distress 2. Psychological: Alert and   Oriented 3. Head/ENT:   Moist  Mucous Membranes                          Head Non traumatic, neck supple                          Normal   Dentition 4. SKIN: normal Skin turgor,  Skin clean Dry foul smelling ulceration of dorsum of the feet with necrotic base. 5. Heart: Regular rate and rhythm no Murmur, Rub or gallop 6. Lungs: Clear to auscultation bilaterally, no wheezes or crackles  7. Abdomen: Soft, non-tender, Non distended 8. Lower extremities: no clubbing, cyanosis, or edema 9. Neurologically Grossly intact, moving all 4 extremities equally 10. MSK: Normal range of motion  body mass index is 19.92 kg/(m^2).   Labs on Admission:   Results for orders placed or performed during the hospital encounter of 12/04/14 (from the past 24 hour(s))  Comprehensive metabolic panel     Status: Abnormal   Collection Time: 12/04/14  6:10 PM  Result Value Ref Range   Sodium 138 135 - 145 mmol/L   Potassium 3.1 (L) 3.5 - 5.1 mmol/L   Chloride 91 (L) 101 - 111 mmol/L   CO2 33 (H) 22 - 32 mmol/L   Glucose, Bld 106 (H) 65 - 99 mg/dL   BUN 7 6 - 20 mg/dL   Creatinine, Ser 2.442.71 (H) 0.44 - 1.00 mg/dL   Calcium 8.4 (L) 8.9 - 10.3 mg/dL   Total Protein 8.5 (H) 6.5 - 8.1 g/dL   Albumin 2.9 (L) 3.5 - 5.0 g/dL   AST 19 15 - 41 U/L   ALT 10 (L) 14 - 54 U/L   Alkaline Phosphatase 67 38 - 126 U/L   Total Bilirubin 0.5 0.3 - 1.2 mg/dL   GFR calc non Af Amer 16 (L) >60 mL/min   GFR calc Af Amer 18 (L) >60 mL/min   Anion gap 14 5 - 15  CBC with Differential     Status: Abnormal   Collection Time: 12/04/14  6:10 PM  Result Value Ref Range   WBC 11.1 (H) 4.0 - 10.5 K/uL   RBC 3.36 (L) 3.87 - 5.11 MIL/uL   Hemoglobin 9.7 (L) 12.0 - 15.0 g/dL   HCT 01.031.4 (L) 27.236.0 - 53.646.0 %   MCV 93.5 78.0 - 100.0 fL   MCH 28.9 26.0 - 34.0 pg   MCHC 30.9 30.0 - 36.0 g/dL   RDW 64.414.7 03.411.5 - 74.215.5 %    Platelets 297 150 - 400 K/uL   Neutrophils Relative % 74 43 - 77 %   Neutro Abs 8.3 (H) 1.7 - 7.7 K/uL   Lymphocytes Relative 15 12 - 46 %   Lymphs Abs 1.6 0.7 - 4.0 K/uL   Monocytes Relative 9 3 - 12 %   Monocytes Absolute 1.0 0.1 - 1.0 K/uL   Eosinophils Relative 1 0 - 5 %   Eosinophils Absolute 0.1 0.0 - 0.7 K/uL   Basophils Relative 1 0 - 1 %   Basophils Absolute 0.1 0.0 - 0.1 K/uL  I-Stat CG4 Lactic Acid, ED     Status: None   Collection Time: 12/04/14  6:18 PM  Result Value Ref Range   Lactic Acid, Venous 1.18 0.5 - 2.0 mmol/L    UA not obtained  Lab Results  Component Value Date   HGBA1C * 05/22/2010    6.4 (NOTE)                                                                       According to the ADA Clinical Practice Recommendations for 2011, when HbA1c is used as a screening test:   >=6.5%   Diagnostic of Diabetes Mellitus           (if abnormal result  is confirmed)  5.7-6.4%   Increased risk of developing Diabetes Mellitus  References:Diagnosis and Classification of Diabetes Mellitus,Diabetes Care,2011,34(Suppl 1):S62-S69 and Standards of Medical Care in         Diabetes - 2011,Diabetes Care,2011,34  (Suppl 1):S11-S61.    Estimated Creatinine Clearance: 16 mL/min (by C-G formula based on Cr of 2.71).  BNP (last 3 results) No results for input(s): PROBNP in the last 8760 hours.  Other results:  I have pearsonaly reviewed this: ECG REPORT  Rate: 89  Rhythm: Sinus rhythm with PVCs ST&T Change: ST depression in lead V5 isolated   Filed Weights   12/04/14 1653  Weight: 59.4 kg (130 lb 15.3 oz)     Cultures:    Component Value Date/Time   SDES BLOOD HEMODIALYSIS CATHETER 04/19/2013 1545   SPECREQUEST  04/19/2013 1545    BOTTLES DRAWN AEROBIC AND ANAEROBIC 10CC PT ON AZITHROMYCIN   CULT  04/19/2013 1545    NO GROWTH 5 DAYS Performed at [facility]   REPTSTATUS 04/25/2013 FINAL 04/19/2013 1545     Radiological Exams on Admission: No results  found.  Chart has been reviewed  Family   at  Bedside  plan of care was discussed with nephew  Assessment/Plan  80 year old female with history of diabetes and stage renal disease on dialysis Tuesday Thursday Saturday here with bilateral lower extremity ulcerations of the feet worrisome for necrosis  Present on Admission:  . Foot ulcer with necrosis of muscle - worrisome for poor blood supply given cold lower extremities with evidence of ulceration and necrosis. We will obtain ABIs. Vascular is is aware of her and will consult. Plan for angiogram in the next few days. Discussed with nephrology hold we will see patient in consult and then give recommendations regarding use of contrast.  . HTN (hypertension), benign - currently stable continue to monitor  . Anemia chronic likely secondary to renal disease  diabetes mellitus - given poor by mouth intake while hospitalized will decrease dose of Levemir to 5 patient may need to be nothing by mouth for procedures order sliding scale . End stage renal disease - the left nephrology note the patient is here. We'll clarify with them if okay to give contrast for angiogram next hemodialysis on Thursday the 19th Abnormal EKG isolated ST depression no chest pain or shortness of breath. Continue to monitor on telemetry repeat EKG obtain troponin.  Prophylaxis:   Lovenox   CODE STATUS:  FULL CODE  as per patient   Disposition:  likely will need placement for rehabilitation    Other plan as per orders.  I have spent a total of 65 min on this admission extra time was taken to discuss case with nephrology  Summerlynn Glauser 12/04/2014, 8:10 PM  [facility]  Pager [phone]   after 2 AM please page floor coverage PA If 7AM-7PM, please contact the day team taking care of the patient  [email or web address]  Password [email or web address]

## 2014-12-04 NOTE — ED Notes (Signed)
Pt arrives from dialysis via [facility]. PT states she developed blisters on both of her feet 4/24 and she popped them with a needle the next day. Pt states her PCP [facility]. There is a foul odor and drainage coming from both feet.

## 2014-12-04 NOTE — ED Notes (Signed)
[facility] @ [phone]

## 2014-12-05 ENCOUNTER — Encounter (HOSPITAL_COMMUNITY): Payer: Self-pay | Admitting: Cardiology

## 2014-12-05 ENCOUNTER — Encounter (HOSPITAL_COMMUNITY)
Admission: EM | Disposition: A | Discharge status: Home Health | Payer: Medicare Other | Source: Home / Self Care | Attending: Internal Medicine

## 2014-12-05 ENCOUNTER — Inpatient Hospital Stay (HOSPITAL_COMMUNITY): Payer: Medicare Other

## 2014-12-05 DIAGNOSIS — I10 Essential (primary) hypertension: Secondary | ICD-10-CM

## 2014-12-05 DIAGNOSIS — L97909 Non-pressure chronic ulcer of unspecified part of unspecified lower leg with unspecified severity: Secondary | ICD-10-CM

## 2014-12-05 HISTORY — PX: PERIPHERAL VASCULAR CATHETERIZATION: SHX172C

## 2014-12-05 LAB — CBC
HCT: 27.6 % — ABNORMAL LOW (ref 36.0–46.0)
HEMOGLOBIN: 8.6 g/dL — AB (ref 12.0–15.0)
MCH: 29.5 pg (ref 26.0–34.0)
MCHC: 31.2 g/dL (ref 30.0–36.0)
MCV: 94.5 fL (ref 78.0–100.0)
Platelets: 263 10*3/uL (ref 150–400)
RBC: 2.92 MIL/uL — AB (ref 3.87–5.11)
RDW: 14.9 % (ref 11.5–15.5)
WBC: 8.1 10*3/uL (ref 4.0–10.5)

## 2014-12-05 LAB — COMPREHENSIVE METABOLIC PANEL
ALBUMIN: 2.2 g/dL — AB (ref 3.5–5.0)
ALT: 8 U/L — ABNORMAL LOW (ref 14–54)
AST: 16 U/L (ref 15–41)
Alkaline Phosphatase: 52 U/L (ref 38–126)
Anion gap: 10 (ref 5–15)
BUN: 14 mg/dL (ref 6–20)
CO2: 32 mmol/L (ref 22–32)
CREATININE: 3.9 mg/dL — AB (ref 0.44–1.00)
Calcium: 8.3 mg/dL — ABNORMAL LOW (ref 8.9–10.3)
Chloride: 96 mmol/L — ABNORMAL LOW (ref 101–111)
GFR calc Af Amer: 12 mL/min — ABNORMAL LOW (ref >60)
GFR calc non Af Amer: 10 mL/min — ABNORMAL LOW (ref >60)
Glucose, Bld: 145 mg/dL — ABNORMAL HIGH (ref 65–99)
POTASSIUM: 3.3 mmol/L — AB (ref 3.5–5.1)
Sodium: 138 mmol/L (ref 135–145)
TOTAL PROTEIN: 6.3 g/dL — AB (ref 6.5–8.1)
Total Bilirubin: 0.6 mg/dL (ref 0.3–1.2)

## 2014-12-05 LAB — GLUCOSE, CAPILLARY
GLUCOSE-CAPILLARY: 51 mg/dL — AB (ref 65–99)
GLUCOSE-CAPILLARY: 83 mg/dL (ref 65–99)
GLUCOSE-CAPILLARY: 77 mg/dL (ref 65–99)
Glucose-Capillary: 121 mg/dL — ABNORMAL HIGH (ref 65–99)
Glucose-Capillary: 170 mg/dL — ABNORMAL HIGH (ref 65–99)
Glucose-Capillary: 71 mg/dL (ref 65–99)
Glucose-Capillary: 73 mg/dL (ref 65–99)
Glucose-Capillary: 223 mg/dL — ABNORMAL HIGH (ref 65–99)

## 2014-12-05 LAB — MRSA PCR SCREENING: MRSA by PCR: NEGATIVE

## 2014-12-05 LAB — POCT ACTIVATED CLOTTING TIME
ACTIVATED CLOTTING TIME: 190 s
Activated Clotting Time: 165 seconds

## 2014-12-05 LAB — TROPONIN I
TROPONIN I: 0.03 ng/mL (ref <0.031)
TROPONIN I: 0.03 ng/mL (ref <0.031)

## 2014-12-05 LAB — PHOSPHORUS: Phosphorus: 4.3 mg/dL (ref 2.5–4.6)

## 2014-12-05 LAB — TSH: TSH: 1.687 u[IU]/mL (ref 0.350–4.500)

## 2014-12-05 LAB — MAGNESIUM: Magnesium: 2.1 mg/dL (ref 1.7–2.4)

## 2014-12-05 SURGERY — ABDOMINAL AORTOGRAM

## 2014-12-05 MED ORDER — DEXTROSE 50 % IV SOLN
INTRAVENOUS | Status: AC
  Administered 2014-12-05: 25 mL
  Filled 2014-12-05: qty 50

## 2014-12-05 MED ORDER — CALCITRIOL 0.5 MCG PO CAPS
1.0000 ug | ORAL_CAPSULE | ORAL | Status: DC
  Administered 2014-12-06: 1 ug via ORAL
  Filled 2014-12-05: qty 2

## 2014-12-05 MED ORDER — HEPARIN SODIUM (PORCINE) 1000 UNIT/ML IJ SOLN
INTRAMUSCULAR | Status: AC
  Filled 2014-12-05: qty 1

## 2014-12-05 MED ORDER — SODIUM CHLORIDE 0.9 % IV SOLN
62.5000 mg | INTRAVENOUS | Status: DC
  Administered 2014-12-06: 62.5 mg via INTRAVENOUS
  Filled 2014-12-05 (×3): qty 5

## 2014-12-05 MED ORDER — IODIXANOL 320 MG/ML IV SOLN
INTRAVENOUS | Status: DC | PRN
  Administered 2014-12-05: 110 mL via INTRA_ARTERIAL

## 2014-12-05 MED ORDER — HEPARIN SODIUM (PORCINE) 5000 UNIT/ML IJ SOLN
5000.0000 [IU] | Freq: Three times a day (TID) | INTRAMUSCULAR | Status: DC
  Administered 2014-12-06 – 2014-12-07 (×5): 5000 [IU] via SUBCUTANEOUS
  Filled 2014-12-05 (×6): qty 1

## 2014-12-05 MED ORDER — FENTANYL CITRATE (PF) 100 MCG/2ML IJ SOLN
INTRAMUSCULAR | Status: AC
  Filled 2014-12-05: qty 2

## 2014-12-05 MED ORDER — FENTANYL CITRATE (PF) 100 MCG/2ML IJ SOLN
INTRAMUSCULAR | Status: DC | PRN
  Administered 2014-12-05: 25 ug via INTRAVENOUS

## 2014-12-05 MED ORDER — MIDAZOLAM HCL 2 MG/2ML IJ SOLN
INTRAMUSCULAR | Status: DC | PRN
  Administered 2014-12-05: 1 mg via INTRAVENOUS

## 2014-12-05 MED ORDER — HEPARIN (PORCINE) IN NACL 2-0.9 UNIT/ML-% IJ SOLN
INTRAMUSCULAR | Status: AC
  Filled 2014-12-05: qty 1000

## 2014-12-05 MED ORDER — SODIUM CHLORIDE 0.9 % IJ SOLN
3.0000 mL | Freq: Two times a day (BID) | INTRAMUSCULAR | Status: DC
  Administered 2014-12-06 – 2014-12-07 (×3): 3 mL via INTRAVENOUS

## 2014-12-05 MED ORDER — SODIUM CHLORIDE 0.9 % IV SOLN: 250.0000 mL | INTRAVENOUS | Status: DC | PRN

## 2014-12-05 MED ORDER — ACETAMINOPHEN 325 MG PO TABS
650.0000 mg | ORAL_TABLET | ORAL | Status: DC | PRN
Start: 2014-12-05 — End: 2014-12-07
  Administered 2014-12-06: 650 mg via ORAL
  Filled 2014-12-05: qty 2

## 2014-12-05 MED ORDER — SODIUM CHLORIDE 0.9 % IJ SOLN: 3.0000 mL | INTRAMUSCULAR | Status: DC | PRN

## 2014-12-05 MED ORDER — MIDAZOLAM HCL 2 MG/2ML IJ SOLN
INTRAMUSCULAR | Status: AC
  Filled 2014-12-05: qty 2

## 2014-12-05 MED ORDER — DARBEPOETIN ALFA 150 MCG/0.3ML IJ SOSY
150.0000 ug | PREFILLED_SYRINGE | INTRAMUSCULAR | Status: DC
  Administered 2014-12-06: 150 ug via INTRAVENOUS
  Filled 2014-12-05: qty 0.3

## 2014-12-05 MED ORDER — LIDOCAINE HCL (PF) 1 % IJ SOLN
INTRAMUSCULAR | Status: AC
  Filled 2014-12-05: qty 30

## 2014-12-05 MED ORDER — ONDANSETRON HCL 4 MG/2ML IJ SOLN: 4.0000 mg | Freq: Four times a day (QID) | INTRAMUSCULAR | Status: DC | PRN

## 2014-12-05 MED ORDER — HEPARIN SODIUM (PORCINE) 1000 UNIT/ML IJ SOLN
INTRAMUSCULAR | Status: DC | PRN
  Administered 2014-12-05: 5000 [IU] via INTRAVENOUS

## 2014-12-05 SURGICAL SUPPLY — 19 items
CATH OMNI FLUSH 5F 65CM (CATHETERS) ×3 IMPLANT
CATH QUICKCROSS .018X135CM (MICROCATHETER) ×3 IMPLANT
DEVICE TORQUE .014-.018 (MISCELLANEOUS) ×1 IMPLANT
HAND CONTROLLER AVANTA (MISCELLANEOUS) IMPLANT
KIT ENCORE 26 ADVANTAGE (KITS) ×3 IMPLANT
KIT PV (KITS) ×3 IMPLANT
SET AVANTA MULTI PATIENT (MISCELLANEOUS) IMPLANT
SET AVANTA SINGLE PATIENT (MISCELLANEOUS) IMPLANT
SET INTRODUCER MICROPUNCT 5F (INTRODUCER) ×3 IMPLANT
SHEATH AVANTA HAND CONTROLLER (MISCELLANEOUS) IMPLANT
SHEATH PINNACLE 5F 10CM (SHEATH) ×3 IMPLANT
SHEATH PINNACLE MP 6F 45CM (SHEATH) ×3 IMPLANT
SYR MEDRAD MARK V 150ML (SYRINGE) ×3 IMPLANT
TORQUE DEVICE .014-.018 (MISCELLANEOUS) ×3
TRANSDUCER W/STOPCOCK (MISCELLANEOUS) ×3 IMPLANT
TRAY PV CATH (CUSTOM PROCEDURE TRAY) ×3 IMPLANT
WIRE BENTSON .035X145CM (WIRE) ×3 IMPLANT
WIRE ROSEN-J .035X180CM (WIRE) ×3 IMPLANT
WIRE SPARTACORE .014X300CM (WIRE) ×3 IMPLANT

## 2014-12-05 NOTE — Progress Notes (Signed)
[facility] PROGRESS NOTE  Jackie [name] ZOX:[id number] DOB: 07/02/1935 DOA: 12/04/2014  PCP: Evlyn CourierHILL,GERALD K, MD  Brief HPI: 80 year old African-American female with past medical history of hypertension, diabetes, end-stage renal disease on dialysis Tuesday, Thursday, Saturday, presented after she was sent over from dialysis center for ulcers on her feet. Apparently patient developed blisters a few weeks ago which she popped with a needle. The wound has been foul-smelling.   Past medical history:  Past Medical History  Diagnosis Date  . Hypertension   . Diabetes mellitus   . Thyroid disease   . Anemia   . Hyperlipidemia   . Arthritis   . End-stage renal disease on hemodialysis     Consultants: Vascular surgery. Nephrology.  Procedures: Arterial Dopplers to be done today  Antibiotics: Vancomycin and Zosyn 5/17  Subjective: Patient denies any pain currently in her feet. Denies any shortness of breath. No nausea, vomiting.  Objective: Vital Signs  Filed Vitals:   12/04/14 2133 12/05/14 0033 12/05/14 0114 12/05/14 0414  BP:    127/45  Pulse:    62  Temp:  100 F (37.8 C) 99 F (37.2 C) 98.5 F (36.9 C)  TempSrc:  Oral Oral Oral  Resp:    16  Height:      Weight:      SpO2: 97%   99%    Intake/Output Summary (Last 24 hours) at 12/05/14 [id number] Last data filed at 12/05/14 0200  Gross per 24 hour  Intake    300 ml  Output      0 ml  Net    300 ml   Filed Weights   12/04/14 1653 12/04/14 2127  Weight: 59.4 kg (130 lb 15.3 oz) 56.881 kg (125 lb 6.4 oz)    General appearance: alert, cooperative, appears stated age and no distress Head: Normocephalic, without obvious abnormality, atraumatic Resp: clear to auscultation bilaterally Cardio: regular rate and rhythm, S1, S2 normal, no murmur, click, rub or gallop GI: soft, non-tender; bowel sounds normal; no masses,  no organomegaly Extremities: Necrotic gangrenous-appearing toes both her feet. Ulcers measuring  anywhere from 5-8 cm noted on the dorsal aspect of both feet with exposed tendon. Foul-smelling. No active drainage is noted. Pulses very poorly palpable. Feet are cold to touch. Neurologic: Nonfocal deficits.  Lab Results:  Basic Metabolic Panel:  Recent Labs Lab 12/04/14 1810 12/05/14 0430  NA 138 138  K 3.1* 3.3*  CL 91* 96*  CO2 33* 32  GLUCOSE 106* 145*  BUN 7 14  CREATININE 2.71* 3.90*  CALCIUM 8.4* 8.3*  MG  --  2.1  PHOS  --  4.3   Liver Function Tests:  Recent Labs Lab 12/04/14 1810 12/05/14 0430  AST 19 16  ALT 10* 8*  ALKPHOS 67 52  BILITOT 0.5 0.6  PROT 8.5* 6.3*  ALBUMIN 2.9* 2.2*   CBC:  Recent Labs Lab 12/04/14 1810 12/05/14 0430  WBC 11.1* 8.1  NEUTROABS 8.3*  --   HGB 9.7* 8.6*  HCT 31.4* 27.6*  MCV 93.5 94.5  PLT 297 263   Cardiac Enzymes:  Recent Labs Lab 12/04/14 2201 12/05/14 0430  TROPONINI 0.03 0.03   CBG:  Recent Labs Lab 12/04/14 2138 12/05/14 0028 12/05/14 0412 12/05/14 0443  GLUCAP 91 170* 51* 121*    Recent Results (from the past 240 hour(s))  MRSA PCR Screening     Status: None   Collection Time: 12/04/14 11:04 PM  Result Value Ref Range Status   MRSA by  PCR NEGATIVE NEGATIVE Final    Comment:        The GeneXpert MRSA Assay (FDA approved for NASAL specimens only), is one component of a comprehensive MRSA colonization surveillance program. It is not intended to diagnose MRSA infection nor to guide or monitor treatment for MRSA infections.       Studies/Results: No results found.  Medications:  Scheduled: . [MAR Hold] calcitRIOL  1 mcg Oral Q T,Th,Sa-HD  . [MAR Hold] darbepoetin (ARANESP) injection - DIALYSIS  150 mcg Intravenous Q Thu-HD  . [MAR Hold] enoxaparin (LOVENOX) injection  30 mg Subcutaneous Q24H  . [MAR Hold] ferric gluconate (FERRLECIT/NULECIT) IV  62.5 mg Intravenous Q Thu-HD  . [MAR Hold] insulin aspart  0-9 Units Subcutaneous 6 times per day  . [MAR Hold] insulin detemir  5  Units Subcutaneous QHS  . [MAR Hold] lanthanum  1,000 mg Oral TID WC  . [MAR Hold] piperacillin-tazobactam (ZOSYN)  IV  2.25 g Intravenous 3 times per day  . [MAR Hold] pregabalin  50 mg Oral TID  . [MAR Hold] sodium chloride  3 mL Intravenous Q12H  . [MAR Hold] vancomycin  500 mg Intravenous Q T,Th,Sa-HD   Continuous:  PRN:[MAR Hold] acetaminophen **OR** [MAR Hold] acetaminophen, [MAR Hold] HYDROcodone-acetaminophen, [MAR Hold]  morphine injection, [MAR Hold] ondansetron **OR** [MAR Hold] ondansetron (ZOFRAN) IV  Assessment/Plan:  Active Problems:   End stage renal disease   HTN (hypertension), benign   Diabetes mellitus   Anemia   Foot ulcer with necrosis of muscle    Foot ulcer with necrosis/exposed tendon This is all likely vascular in nature. We might be dealing with ischemic limb. Vascular surgery has been consulted. Plan is for angiogram today. Arterial Dopplers pending. Continue with broad-spectrum antibiotics for now.  Essential HTN (hypertension), benign Currently stable. Continue to monitor   Anemia chronic likely secondary to renal disease Some drop in hemoglobin noted. No overt bleeding. Continue to monitor. Transfuse as needed.   Diabetes mellitus , type II Continue Levemir and SSI.  End stage renal disease on dialysis, TTS Nephrology is following.  Abnormal EKG Isolated ST depression but patient denies chest pain or shortness of breath. Continue to monitor on telemetry. Troponins are normal.  DVT Prophylaxis: Lovenox    Code Status: Full code  Family Communication: Discussed with the patient  Disposition Plan: Lower extremity angiogram later today. Not ready for discharge.   LOS: 1 day   [facility] Hospital[facility]ISHNAN,Asherah Lavoy  [facility] Pager [phone] 12/05/2014, 8:29 AM  If 7PM-7AM, please contact night-coverage at [email or web address], password [email or web address]

## 2014-12-05 NOTE — Interval H&P Note (Signed)
[facility] [place] [facility]  History and Physical Update  The patient was interviewed and re-examined.  The patient's previous History and Physical has been reviewed and is unchanged from Dr. Estanislado SpireBrabham's consult.  There is no change in the plan of care: aortogram, bilateral leg runoff, and possible intervention right leg.  I discussed with the patient the nature of angiographic procedures, especially the limited patencies of any endovascular intervention.  The patient is aware of that the risks of an angiographic procedure include but are not limited to: bleeding, infection, access site complications, renal failure, embolization, rupture of vessel, dissection, possible need for emergent surgical intervention, possible need for surgical procedures to treat the patient's pathology, anaphylactic reaction to contrast, and stroke and death.  The patient is aware of the risks and agrees to proceed.  Leonides SakeBrian Makaiya Geerdes, MD [facility] [place] Office: [phone] Pager: [phone]  12/05/2014, 11:44 AM

## 2014-12-05 NOTE — Progress Notes (Signed)
Site area: rt groin Site Prior to Removal:  Level 0 Pressure Applied For: 20 minutes Manual:   yes Patient Status During Pull:  stable Post Pull Site:  Level 0 Post Pull Instructions Given:  yes Post Pull Pulses Present: yes Dressing Applied:  tegaderm Bedrest begins @ 1655 Comments: none

## 2014-12-05 NOTE — H&P (View-Only) (Signed)
       Consult Note  Patient name: Jackie [name] MRN: [id number] DOB: 06/03/1935 Sex: female  Consulting Physician:  ER  Reason for Consult:  Chief Complaint  Patient presents with  . Skin Problem    HISTORY OF PRESENT ILLNESS: This is a 80 year old female with end stage renal disease on HD T,TH, SAt.  Patient reports waking up on April 244 and noticing blisters on both feet.  She popped them the following day.  They have continued to drain.  She saw her PCP about 2 weeks ago who prescribed silvadene and referred her to the wound center.  She feels they have been getting worse over the past few days with foul smelling drainage.  She denies fevers or chils  She is medically managed for hypertension.  She has Type 2 DM.  There are no recent Hb A1c.  The last I can see is 6.4 several years ago.  She has hypercholesterolemia but is not on a statin.  She is a non smoker.    Past Medical History  Diagnosis Date  . Hypertension   . Diabetes mellitus   . Chronic kidney disease   . Thyroid disease   . Anemia   . Hyperlipidemia   . Arthritis     Past Surgical History  Procedure Laterality Date  . Arteriovenous graft placement Left     non function  . Eye surgery Bilateral     catarct  . Cesarean section    . Av fistula placement Right 01/30/2013    Procedure: ARTERIOVENOUS (AV) FISTULA CREATION;  Surgeon: Brian L Chen, MD;  Location: [facility] OR;  Service: Vascular;  Laterality: Right;  . Insertion of dialysis catheter Right 04/24/2013    Procedure: INSERTION OF DIALYSIS CATHETER;  Surgeon: Todd F Early, MD;  Location: [facility] OR;  Service: Vascular;  Laterality: Right;  . Fistula superficialization Right 04/24/2013    Procedure: FISTULA SUPERFICIALIZATION;  Surgeon: Todd F Early, MD;  Location: [facility] OR;  Service: Vascular;  Laterality: Right;  . Fistulogram Right 07/26/2013    Procedure: FISTULOGRAM;  Surgeon: Todd F Early, MD;  Location: [facility] CATH LAB;  Service: Cardiovascular;  Laterality:  Right;    History   Social History  . Marital Status: Widowed    Spouse Name: N/A  . Number of Children: N/A  . Years of Education: N/A   Occupational History  . Not on file.   Social History Main Topics  . Smoking status: Never Smoker   . Smokeless tobacco: Never Used  . Alcohol Use: No  . Drug Use: No  . Sexual Activity: Not on file   Other Topics Concern  . Not on file   Social History Narrative    Family History  Problem Relation Age of Onset  . Diabetes Mother   . Heart disease Mother   . Hypertension Mother     Allergies as of 12/04/2014  . (No Known Allergies)    No current facility-administered medications on file prior to encounter.   Current Outpatient Prescriptions on File Prior to Encounter  Medication Sig Dispense Refill  . albuterol (PROVENTIL HFA;VENTOLIN HFA) 108 (90 BASE) MCG/ACT inhaler Inhale 1-2 puffs into the lungs every 6 (six) hours as needed for wheezing. 1 Inhaler 0  . B Complex-C-Folic Acid (B COMPLEX-VITAMIN C-FOLIC ACID) 1 MG tablet Take 1 tablet by mouth daily with breakfast.    . FOSRENOL 1000 MG chewable tablet Chew 1,000 mg by mouth 3 (three) times   daily with meals.     . insulin aspart (NOVOLOG) 100 UNIT/ML injection Inject 5 Units into the skin daily as needed for high blood sugar.     . insulin detemir (LEVEMIR) 100 UNIT/ML injection Inject 15 Units into the skin at bedtime.    . IRON PO Take 1 tablet by mouth daily.    . pregabalin (LYRICA) 50 MG capsule Take 50 mg by mouth 3 (three) times daily as needed. For nerve pain in feet per patient    . silver sulfADIAZINE (SILVADENE) 1 % cream Apply 1 application topically daily. 50 g 0  . oxyCODONE (ROXICODONE) 5 MG immediate release tablet Take 1 tablet (5 mg total) by mouth every 4 (four) hours as needed for pain. (Patient not taking: Reported on 12/04/2014) 50 tablet 0     REVIEW OF SYSTEMS: Cardiovascular: No chest pain, chest pressure, palpitations, orthopnea, or dyspnea on  exertion. No claudication or rest pain,  No history of DVT or phlebitis. Pulmonary: No productive cough, asthma or wheezing. Neurologic: No weakness, paresthesias, aphasia, or amaurosis. No dizziness. Hematologic: No bleeding problems or clotting disorders. Musculoskeletal: No joint pain or joint swelling. Gastrointestinal: No blood in stool or hematemesis Genitourinary: No dysuria or hematuria. Psychiatric:: No history of major depression. Integumentary: See HPI Constitutional: No fever or chills.  PHYSICAL EXAMINATION: General: The patient appears their stated age.  Vital signs are BP 161/57 mmHg  Pulse 79  Temp(Src) 99.3 F (37.4 C) (Oral)  Resp 18  Ht 5\' 8"  (1.727 m)  Wt 130 lb 15.3 oz (59.4 kg)  BMI 19.92 kg/m2  SpO2 100% Pulmonary: Respirations are non-labored HEENT:  No gross abnormalities Abdomen: Soft and non-tender  Musculoskeletal: There are no major deformities.   Neurologic: No focal weakness or paresthesias are detected, Skin: Large ulcers on bilateral lower extremities.  The left is more extensive with the tissue loss extending up to the ankle.  There is exposed tendon.  On the right it involves the middle 3 toes.  There is some phallus odor associated.Marland Kitchen. Psychiatric: The patient has normal affect. Cardiovascular: There is a regular rate and rhythm without significant murmur appreciated.  Palpable femoral pulses  Diagnostic Studies: ABI's ordered    Assessment:  Bilateral lower extremity ulcers Plan: This is clearly a limb threatening situation.  She will need angiography to determine if she has any options for revascularization.  She clearly has necrotic tissue and will require some form of a dictation on both feet.  I did discuss this with her and she was obviously very emotional.  I will try to get her angiogram done either today or tomorrow.  If any intervention can be done we would proceed.  Otherwise would just do wound care.  Consult the wound service but  Silvadene will likely be appropriate for both wounds     V. Charlena CrossWells Shellye Zandi IV, M.D. [facility] [place] Office: [phone] Pager:  [phone]

## 2014-12-05 NOTE — Progress Notes (Signed)
Utilization review completed. Keryl Gholson, RN, BSN. 

## 2014-12-05 NOTE — Progress Notes (Signed)
Hypoglycemic Event  CBG: 51  Treatment: D50 IV 25 mL  Symptoms: None  Follow-up CBG: Time:04:43 CBG Result:121  Possible Reasons for Event: Inadequate meal intake  Comments/MD notified: Tama GanderKatherine Schorr, NP    Leim FabryBirago, [place]  Remember to initiate Hypoglycemia Order Set & complete

## 2014-12-05 NOTE — Consult Note (Signed)
Indication for Consultation:  Management of ESRD/hemodialysis; anemia, hypertension/volume and secondary hyperparathyroidism  HPI: Jackie [name] is a 80 y.o. female who presented to the ED from her outpt HD center yesterday for evaluation of worsening foot wounds, she recieves HD TTS @ [facility], last treatment was yesterday. History of HTN and DM. She reports she first noticed blisters on the top of her L foot on April 21st. She saw her PCP who was managing and referred her to the wound center but her appt was not until June 15th.  Yesterday at her outpt HD center MD evaled foot wounds and recommend she come to the ED. She reports wounds haven been getting worse over the last few days but she was too embarrassed to show anyone. Vascular surgery has been consulted. Will arrange for HD while admitted.   Past Medical History  Diagnosis Date  . Hypertension   . Diabetes mellitus   . Chronic kidney disease   . Thyroid disease   . Anemia   . Hyperlipidemia   . Arthritis    Past Surgical History  Procedure Laterality Date  . Arteriovenous graft placement Left     non function  . Eye surgery Bilateral     catarct  . Cesarean section    . Av fistula placement Right 01/30/2013    Procedure: ARTERIOVENOUS (AV) FISTULA CREATION;  Surgeon: Fransisco HertzBrian L Chen, MD;  [facility];  Service: Vascular;  Laterality: Right;  . Insertion of dialysis catheter Right 04/24/2013    Procedure: INSERTION OF DIALYSIS CATHETER;  Surgeon: Larina Earthlyodd F Early, MD;  [facility];  Service: Vascular;  Laterality: Right;  . Fistula superficialization Right 04/24/2013    Procedure: FISTULA SUPERFICIALIZATION;  Surgeon: Larina Earthlyodd F Early, MD;  [facility];  Service: Vascular;  Laterality: Right;  . Fistulogram Right 07/26/2013    Procedure: FISTULOGRAM;  Surgeon: Larina Earthlyodd F Early, MD;  [facility];  Service: Cardiovascular;  Laterality: Right;   Family History  Problem Relation Age of Onset  . Diabetes Mother   . Heart  disease Mother   . Hypertension Mother    Social History:  reports that she has never smoked. She has never used smokeless tobacco. She reports that she does not drink alcohol or use illicit drugs. No Known Allergies Prior to Admission medications   Medication Sig Start Date End Date Taking? Authorizing Provider  albuterol (PROVENTIL HFA;VENTOLIN HFA) 108 (90 BASE) MCG/ACT inhaler Inhale 1-2 puffs into the lungs every 6 (six) hours as needed for wheezing. 12/27/12  Yes April Palumbo, MD  B Complex-C-Folic Acid (B COMPLEX-VITAMIN C-FOLIC ACID) 1 MG tablet Take 1 tablet by mouth daily with breakfast.   Yes Historical Provider, MD  FOSRENOL 1000 MG chewable tablet Chew 1,000 mg by mouth 3 (three) times daily with meals.  01/18/13  Yes Historical Provider, MD  insulin aspart (NOVOLOG) 100 UNIT/ML injection Inject 5 Units into the skin daily as needed for high blood sugar.    Yes Historical Provider, MD  insulin detemir (LEVEMIR) 100 UNIT/ML injection Inject 15 Units into the skin at bedtime.   Yes Historical Provider, MD  IRON PO Take 1 tablet by mouth daily.   Yes Historical Provider, MD  pregabalin (LYRICA) 50 MG capsule Take 50 mg by mouth 3 (three) times daily as needed. For nerve pain in feet per patient   Yes Historical Provider, MD  silver sulfADIAZINE (SILVADENE) 1 % cream Apply 1 application topically daily. 11/12/14  Yes Hayden Rasmussenavid Mabe, NP  oxyCODONE (ROXICODONE) 5 MG immediate release tablet Take 1 tablet (5 mg total) by mouth every 4 (four) hours as needed for pain. Patient not taking: Reported on 12/04/2014 04/26/13   Hollice Espy, MD   Current Facility-Administered Medications  Medication Dose Route Frequency Provider Last Rate Last Dose  . acetaminophen (TYLENOL) tablet 650 mg  650 mg Oral Q6H PRN Therisa Doyne, MD   650 mg at 12/04/14 2246   Or  . acetaminophen (TYLENOL) suppository 650 mg  650 mg Rectal Q6H PRN Therisa Doyne, MD      . enoxaparin (LOVENOX) injection 30 mg   30 mg Subcutaneous Q24H Therisa Doyne, MD   30 mg at 12/04/14 2247  . HYDROcodone-acetaminophen (NORCO/VICODIN) 5-325 MG per tablet 1-2 tablet  1-2 tablet Oral Q4H PRN Therisa Doyne, MD      . insulin aspart (novoLOG) injection 0-9 Units  0-9 Units Subcutaneous 6 times per day Therisa Doyne, MD   2 Units at 12/05/14 0056  . insulin detemir (LEVEMIR) injection 5 Units  5 Units Subcutaneous QHS Therisa Doyne, MD   5 Units at 12/04/14 2246  . lanthanum (FOSRENOL) chewable tablet 1,000 mg  1,000 mg Oral TID WC Therisa Doyne, MD      . morphine 2 MG/ML injection 2 mg  2 mg Intravenous Q4H PRN Therisa Doyne, MD      . ondansetron (ZOFRAN) tablet 4 mg  4 mg Oral Q6H PRN Therisa Doyne, MD       Or  . ondansetron (ZOFRAN) injection 4 mg  4 mg Intravenous Q6H PRN Therisa Doyne, MD      . piperacillin-tazobactam (ZOSYN) IVPB 2.25 g  2.25 g Intravenous 3 times per day Herby Abraham, RPH   2.25 g at 12/05/14 0546  . pregabalin (LYRICA) capsule 50 mg  50 mg Oral TID Therisa Doyne, MD   50 mg at 12/04/14 2246  . sodium chloride 0.9 % injection 3 mL  3 mL Intravenous Q12H Therisa Doyne, MD   3 mL at 12/04/14 2230  . [START ON 12/06/2014] vancomycin (VANCOCIN) 500 mg in sodium chloride 0.9 % 100 mL IVPB  500 mg Intravenous Q T,Th,Sa-HD Herby Abraham, [facility]       Labs: Basic Metabolic Panel:  Recent Labs Lab 12/04/14 1810 12/05/14 0430  NA 138 138  K 3.1* 3.3*  CL 91* 96*  CO2 33* 32  GLUCOSE 106* 145*  BUN 7 14  CREATININE 2.71* 3.90*  CALCIUM 8.4* 8.3*  PHOS  --  4.3   Liver Function Tests:  Recent Labs Lab 12/04/14 1810 12/05/14 0430  AST 19 16  ALT 10* 8*  ALKPHOS 67 52  BILITOT 0.5 0.6  PROT 8.5* 6.3*  ALBUMIN 2.9* 2.2*   No results for input(s): LIPASE, AMYLASE in the last 168 hours. No results for input(s): AMMONIA in the last 168 hours. CBC:  Recent Labs Lab 12/04/14 1810 12/05/14 0430  WBC 11.1* 8.1  NEUTROABS 8.3*  --    HGB 9.7* 8.6*  HCT 31.4* 27.6*  MCV 93.5 94.5  PLT 297 263   Cardiac Enzymes:  Recent Labs Lab 12/04/14 2201 12/05/14 0430  TROPONINI 0.03 0.03   CBG:  Recent Labs Lab 12/04/14 2138 12/05/14 0028 12/05/14 0412 12/05/14 0443 12/05/14 0804  GLUCAP 91 170* 51* 121* 71   Iron Studies: No results for input(s): IRON, TIBC, TRANSFERRIN, FERRITIN in the last 72 hours. Studies/Results: No results found.   Review of Systems: Reports feeling well- denies chest pain/sob/LE  edema Reports sensation intact to bilat LE/ denies increased pain Denies fever/chills  Physical Exam: Filed Vitals:   12/04/14 2133 12/05/14 0033 12/05/14 0114 12/05/14 0414  BP:    127/45  Pulse:    62  Temp:  100 F (37.8 C) 99 F (37.2 C) 98.5 F (36.9 C)  TempSrc:  Oral Oral Oral  Resp:    16  Height:      Weight:      SpO2: 97%   99%     General: Well developed, well nourished, in no acute distress. Head: Normocephalic, atraumatic, sclera non-icteric, mucus membranes are moist Neck: Supple. JVD not elevated. Lungs: Clear bilaterally to auscultation without wheezes, rales, or rhonchi. Breathing is unlabored. Heart: RRR with S1 S2. No murmurs, rubs, or gallops appreciated. Abdomen: Soft, non-tender, non-distended with normoactive bowel sounds. No rebound/guarding. No obvious abdominal masses. M-S:  Strength and tone appear normal for age. Lower extremities: bilat feet with necrotic ulcers L>R with surrounding necrotic tissue. Left dorsal foot  ulceration from toes to ankle. Reports sensation.  Neuro: Alert and oriented X 3. Moves all extremities spontaneously. Psych:  Responds to questions appropriately with a normal affect. Dialysis Access: R AVF +b/t  Dialysis Orders:  TTS [facility] 4hrs   57.5 kgs  2K/2Ca+ 1800u heparin Micera 100 q 2 weeks - last 5/12.  Venofer 50 q week     Calcitriol 1    Assessment/Plan: 1.  Necrotic foot wounds- VVS consulted. Plan for angiogram. Vanc and zosyn 2.   ESRD -  TTS [facility]. HD tomorrow. K+ 3.3 3.  Hypertension/volume  - 127/45- no BP meds/no volume excess 4.  Anemia  - hgb 8.6- last micera 5/12- start ESA, hgb has been slowly dropping down from 11.6 on 4/7 5.  Metabolic bone disease -  Ca+8.3  phos 4.3- fosrenol and phoslo when diet advanced. Last pth 267- cont calcitriol 6.  Nutrition - NPO alb 2.2. Multi vit 7. DM- per primary.   Jetty DuhamelBridget Whelan, NP [facility] [phone] 12/05/2014, 10:00 AM   Pt seen, examined and agree w A/P as above.  Vinson Moselleob Roda Lauture MD pager [phone]    cell [phone] 12/05/2014, 5:21 PM

## 2014-12-05 NOTE — Progress Notes (Signed)
VASCULAR LAB PRELIMINARY  ARTERIAL  ABI completed:    RIGHT    LEFT    PRESSURE WAVEFORM  PRESSURE WAVEFORM  BRACHIAL Dialysis Access graft  BRACHIAL 123 Triphasic  AT 89 Monophasic AT 90 Dampened Monophasic  PT 193 Monophasic PT >300 Dampened Monophasic    RIGHT LEFT  ABI 1.57 Not ascertained   ABIs on the right appear within normal limits however Doppler waveforms would suggest a false elevation due to calcification. Left ABIs could not be ascertained due to non compressible vessels. Doppler waveforms would suggest a false elevation due to calcification  Claudio Mondry, RVS 12/05/2014, 12:52 PM

## 2014-12-05 NOTE — Progress Notes (Signed)
Inpatient Diabetes Program Recommendations  AACE/ADA: New Consensus Statement on Inpatient Glycemic Control (2013)  Target Ranges:  Prepandial:   less than 140 mg/dL      Peak postprandial:   less than 180 mg/dL (1-2 hours)      Critically ill patients:  140 - 180 mg/dL   Results for Jackie [name], Jackie [name] (MRN [id number]) as of 12/05/2014 09:47  Ref. Range 12/04/2014 21:38 12/05/2014 00:28 12/05/2014 04:12 12/05/2014 04:43 12/05/2014 08:04  Glucose-Capillary Latest Ref Range: 65-99 mg/dL 91 782170 (H) 51 ([name]) 956121 (H) 71    Outpatient Diabetes medications: Levemir 15 units QHS, Novolog 5 units PRN (for high glucose) Current orders for Inpatient glycemic control: Levemir 5 units QHS, Novolog 0-9 units Q4H  Inpatient Diabetes Program Recommendations Insulin - Basal: Patient received Levemir 5 units last night at 22:46 and CBG down to 51 mg/dl this morning at 2:134:12 am. May want to consider discontinuing Levemir at this time and follow glucose trends to determine if basal insulin needs to be added back.  Thanks, Orlando PennerMarie Analy Bassford, RN, MSN, CCRN, CDE Diabetes Coordinator Inpatient Diabetes Program [phone] (Team Pager from 8am to 5pm) [phone] ([facility] office) [phone] [facility] office)

## 2014-12-05 NOTE — Progress Notes (Signed)
New Admission Note:  Arrival Method: Via stretcher  Mental Orientation: Alert and oriented x4 Telemetry: Placed on tele, CCMD notified Assessment: initiated Skin: see docflow sheet, assessed with charge nurse IV: Left forearm, saline locked Pain: denies any pain Tubes: n/a Safety Measures: Safety Fall Prevention Plan was given, discussed and signed. Admission: Completed [address] Orientation: Patient has been orientated to the room, unit and the staff. Family: Nephew and son at bedside  Orders have been reviewed and implemented. Will continue to monitor the patient. Call light has been placed within reach and bed alarm has been activated.   Tempie DonningMercy Jaryn Rosko BSN, RN  Phone Number: [phone] [facility] [place] [facility]

## 2014-12-06 LAB — CBC
HCT: 25.5 % — ABNORMAL LOW (ref 36.0–46.0)
HEMOGLOBIN: 8 g/dL — AB (ref 12.0–15.0)
MCH: 29.5 pg (ref 26.0–34.0)
MCHC: 31.4 g/dL (ref 30.0–36.0)
MCV: 94.1 fL (ref 78.0–100.0)
Platelets: 288 10*3/uL (ref 150–400)
RBC: 2.71 MIL/uL — AB (ref 3.87–5.11)
RDW: 15.2 % (ref 11.5–15.5)
WBC: 8 10*3/uL (ref 4.0–10.5)

## 2014-12-06 LAB — BASIC METABOLIC PANEL
ANION GAP: 12 (ref 5–15)
BUN: 35 mg/dL — ABNORMAL HIGH (ref 6–20)
CHLORIDE: 94 mmol/L — AB (ref 101–111)
CO2: 29 mmol/L (ref 22–32)
Calcium: 8.1 mg/dL — ABNORMAL LOW (ref 8.9–10.3)
Creatinine, Ser: 6.92 mg/dL — ABNORMAL HIGH (ref 0.44–1.00)
GFR calc non Af Amer: 5 mL/min — ABNORMAL LOW (ref >60)
GFR, EST AFRICAN AMERICAN: 6 mL/min — AB (ref >60)
Glucose, Bld: 163 mg/dL — ABNORMAL HIGH (ref 65–99)
POTASSIUM: 3.9 mmol/L (ref 3.5–5.1)
SODIUM: 135 mmol/L (ref 135–145)

## 2014-12-06 LAB — GLUCOSE, CAPILLARY
GLUCOSE-CAPILLARY: 168 mg/dL — AB (ref 65–99)
GLUCOSE-CAPILLARY: 81 mg/dL (ref 65–99)
GLUCOSE-CAPILLARY: 90 mg/dL (ref 65–99)
Glucose-Capillary: 260 mg/dL — ABNORMAL HIGH (ref 65–99)
Glucose-Capillary: 200 mg/dL — ABNORMAL HIGH (ref 65–99)

## 2014-12-06 LAB — HEMOGLOBIN A1C
Hgb A1c MFr Bld: 6.3 % — ABNORMAL HIGH (ref 4.8–5.6)
Mean Plasma Glucose: 134 mg/dL

## 2014-12-06 MED ORDER — SODIUM CHLORIDE 0.9 % IV SOLN: 100.0000 mL | INTRAVENOUS | Status: DC | PRN

## 2014-12-06 MED ORDER — DARBEPOETIN ALFA 150 MCG/0.3ML IJ SOSY
PREFILLED_SYRINGE | INTRAMUSCULAR | Status: AC
  Administered 2014-12-06: 150 ug via INTRAVENOUS
  Filled 2014-12-06: qty 0.3

## 2014-12-06 MED ORDER — HEPARIN SODIUM (PORCINE) 1000 UNIT/ML DIALYSIS: 1000.0000 [IU] | INTRAMUSCULAR | Status: DC | PRN

## 2014-12-06 MED ORDER — INSULIN ASPART 100 UNIT/ML ~~LOC~~ SOLN: 0.0000 [IU] | Freq: Every day | SUBCUTANEOUS | Status: DC

## 2014-12-06 MED ORDER — HEPARIN SODIUM (PORCINE) 1000 UNIT/ML DIALYSIS: 1800.0000 [IU] | Freq: Once | INTRAMUSCULAR | Status: DC

## 2014-12-06 MED ORDER — ALTEPLASE 2 MG IJ SOLR
2.0000 mg | Freq: Once | INTRAMUSCULAR | Status: DC | PRN
  Filled 2014-12-06: qty 2

## 2014-12-06 MED ORDER — PENTAFLUOROPROP-TETRAFLUOROETH EX AERO: 1.0000 "application " | INHALATION_SPRAY | CUTANEOUS | Status: DC | PRN

## 2014-12-06 MED ORDER — SILVER SULFADIAZINE 1 % EX CREA
TOPICAL_CREAM | Freq: Every day | CUTANEOUS | Status: DC
  Administered 2014-12-06: 20:00:00 via TOPICAL
  Filled 2014-12-06: qty 85

## 2014-12-06 MED ORDER — INSULIN ASPART 100 UNIT/ML ~~LOC~~ SOLN
0.0000 [IU] | Freq: Three times a day (TID) | SUBCUTANEOUS | Status: DC
  Administered 2014-12-06: 5 [IU] via SUBCUTANEOUS

## 2014-12-06 MED ORDER — LIDOCAINE-PRILOCAINE 2.5-2.5 % EX CREA: 1.0000 "application " | TOPICAL_CREAM | CUTANEOUS | Status: DC | PRN

## 2014-12-06 MED ORDER — LIDOCAINE HCL (PF) 1 % IJ SOLN: 5.0000 mL | INTRAMUSCULAR | Status: DC | PRN

## 2014-12-06 MED ORDER — PREGABALIN 75 MG PO CAPS
75.0000 mg | ORAL_CAPSULE | Freq: Every day | ORAL | Status: DC
  Administered 2014-12-07: 75 mg via ORAL
  Filled 2014-12-06 (×2): qty 1

## 2014-12-06 MED ORDER — NEPRO/CARBSTEADY PO LIQD: 237.0000 mL | ORAL | Status: DC | PRN

## 2014-12-06 MED FILL — Heparin Sodium (Porcine) 2 Unit/ML in Sodium Chloride 0.9%: INTRAMUSCULAR | Qty: 1000 | Status: AC

## 2014-12-06 MED FILL — Lidocaine HCl Local Preservative Free (PF) Inj 1%: INTRAMUSCULAR | Qty: 30 | Status: AC

## 2014-12-06 NOTE — Progress Notes (Signed)
[facility] PROGRESS NOTE  Jackie [name] ZOX:[id number] DOB: 09/17/1934 DOA: 12/04/2014  PCP: Evlyn CourierHILL,GERALD K, MD  Brief HPI: 80 year old African-American female with past medical history of hypertension, diabetes, end-stage renal disease on dialysis Tuesday, Thursday, Saturday, presented after she was sent over from dialysis center for ulcers on her feet. Apparently patient developed blisters a few weeks ago which she popped with a needle. The wound has been foul-smelling.   Past medical history:  Past Medical History  Diagnosis Date  . Hypertension   . Diabetes mellitus   . Thyroid disease   . Anemia   . Hyperlipidemia   . Arthritis   . End-stage renal disease on hemodialysis     Consultants: Vascular surgery. Nephrology.  Procedures:  Arterial Dopplers 5/18 Aortogram, bilateral leg runoff 5/80  Antibiotics: Vancomycin and Zosyn 5/17  Subjective: Patient feels better this morning. Somewhat upset knowing the poor prognosis of her feet. Denies any chest pain, shortness of breath. No nausea, vomiting.   Objective: Vital Signs  Filed Vitals:   12/05/14 2021 12/06/14 0411 12/06/14 0415 12/06/14 0736  BP: 118/57 120/58 120/58   Pulse: 79 76 75   Temp: 98.9 F (37.2 C) 100.4 F (38 C)  98.2 F (36.8 C)  TempSrc: Oral Oral  Oral  Resp: 18 16    Height:      Weight:      SpO2: 99% 100%      Intake/Output Summary (Last 24 hours) at 12/06/14 [id number] Last data filed at 12/06/14 0600  Gross per 24 hour  Intake    630 ml  Output      0 ml  Net    630 ml   Filed Weights   12/04/14 1653 12/04/14 2127  Weight: 59.4 kg (130 lb 15.3 oz) 56.881 kg (125 lb 6.4 oz)    General appearance: alert, cooperative, appears stated age and no distress Resp: clear to auscultation bilaterally Cardio: regular rate and rhythm, S1, S2 normal, no murmur, click, rub or gallop GI: soft, non-tender; bowel sounds normal; no masses,  no organomegaly Extremities: Necrotic appearing toes  both her feet. Ulcers measuring anywhere from 5-8 cm noted on the dorsal aspect of both feet with exposed tendon. Foul-smelling. No active drainage is noted. Pulses very poorly palpable. Feet are cold to touch. Neurologic: Nonfocal deficits.  Lab Results:  Basic Metabolic Panel:  Recent Labs Lab 12/04/14 1810 12/05/14 0430  NA 138 138  K 3.1* 3.3*  CL 91* 96*  CO2 33* 32  GLUCOSE 106* 145*  BUN 7 14  CREATININE 2.71* 3.90*  CALCIUM 8.4* 8.3*  MG  --  2.1  PHOS  --  4.3   Liver Function Tests:  Recent Labs Lab 12/04/14 1810 12/05/14 0430  AST 19 16  ALT 10* 8*  ALKPHOS 67 52  BILITOT 0.5 0.6  PROT 8.5* 6.3*  ALBUMIN 2.9* 2.2*   CBC:  Recent Labs Lab 12/04/14 1810 12/05/14 0430  WBC 11.1* 8.1  NEUTROABS 8.3*  --   HGB 9.7* 8.6*  HCT 31.4* 27.6*  MCV 93.5 94.5  PLT 297 263   Cardiac Enzymes:  Recent Labs Lab 12/04/14 2201 12/05/14 0430 12/05/14 0916  TROPONINI 0.03 0.03 0.03   CBG:  Recent Labs Lab 12/05/14 1147 12/05/14 1621 12/05/14 1721 12/05/14 2011 12/06/14 0002  GLUCAP 83 77 73 223* 168*    Recent Results (from the past 240 hour(s))  Wound culture     Status: None (Preliminary result)   Collection Time:  12/04/14  6:07 PM  Result Value Ref Range Status   Specimen Description WOUND  Final   Special Requests RIGHT FOOT  Final   Gram Stain   Final    NO WBC SEEN NO SQUAMOUS EPITHELIAL CELLS SEEN FEW GRAM POSITIVE COCCI IN PAIRS Performed at [facility]    Culture   Final    Culture reincubated for better growth Performed at [facility]    Report Status PENDING  Incomplete  MRSA PCR Screening     Status: None   Collection Time: 12/04/14 11:04 PM  Result Value Ref Range Status   MRSA by PCR NEGATIVE NEGATIVE Final    Comment:        The GeneXpert MRSA Assay (FDA approved for NASAL specimens only), is one component of a comprehensive MRSA colonization surveillance program. It is not intended to diagnose  MRSA infection nor to guide or monitor treatment for MRSA infections.       Studies/Results: No results found.  Medications:  Scheduled: . calcitRIOL  1 mcg Oral Q T,Th,Sa-HD  . darbepoetin (ARANESP) injection - DIALYSIS  150 mcg Intravenous Q Thu-HD  . ferric gluconate (FERRLECIT/NULECIT) IV  62.5 mg Intravenous Q Thu-HD  . heparin  5,000 Units Subcutaneous 3 times per day  . insulin aspart  0-9 Units Subcutaneous 6 times per day  . insulin detemir  5 Units Subcutaneous QHS  . lanthanum  1,000 mg Oral TID WC  . piperacillin-tazobactam (ZOSYN)  IV  2.25 g Intravenous 3 times per day  . pregabalin  50 mg Oral TID  . sodium chloride  3 mL Intravenous Q12H  . sodium chloride  3 mL Intravenous Q12H  . 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 Extensive occlusive disease was noted in the lower extremities. Unfortunately, patient is not a candidate for revascularization procedure. Discussed with Dr. Myra GianottiBrabham today. The plan is for wound care for now. He will follow the patient in his office in the near future. If the patient's wound's get worse she will likely need to undergo amputation. Continue with broad-spectrum antibiotics for now. She will need to be discharged with antibiotics. Ideally intravenous and could be given with dialysis.  Essential HTN (hypertension), benign Currently stable. Continue to monitor   Anemia chronic likely secondary  to renal disease Some drop in hemoglobin noted. No overt bleeding. Continue to monitor. Transfuse as needed.   Diabetes mellitus , type II Continue Levemir and SSI.  End stage renal disease on dialysis, TTS Nephrology is following.  Abnormal EKG Isolated ST depression but patient denies chest pain or shortness of breath. Continue to monitor on telemetry. Troponins are normal.  DVT Prophylaxis: Lovenox    Code Status: Full code  Family Communication: Discussed with the patient  Disposition Plan: Wound care today. Will be evaluated by PT and OT. Anticipate discharge tomorrow. Case management to assist with referral to wound care center.   LOS: 2 days   [facility]rieKRISHNAN,Dionna Wiedemann  [facility] Pager [phone] 12/06/2014, 8:21 AM  If 7PM-7AM, please contact night-coverage at [email or web address],  password [facility]

## 2014-12-06 NOTE — Progress Notes (Signed)
Subjective:   Feeling great today. No complaints.   Objective Filed Vitals:   12/05/14 2021 12/06/14 0411 12/06/14 0415 12/06/14 0736  BP: 118/57 120/58 120/58   Pulse: 79 76 75   Temp: 98.9 F (37.2 C) 100.4 F (38 C)  98.2 F (36.8 C)  TempSrc: Oral Oral  Oral  Resp: 18 16    Height:      Weight:      SpO2: 99% 100%      Physical Exam General: alert and oriented. No acute distress.  Heart: RRR Lungs: CTA,  Abdomen: soft, nontender +BS Extremities: no edema. Necrotic wounds bilat feet. Foul smelling. Dialysis Access: R AVF +b/t   Dialysis Orders: TTS [facility] 4hrs 57.5 kgs 2K/2Ca+   1800u heparin Micera 100 q 2 weeks - last 5/12. Venofer 50 q week Calcitriol 1    Assessment/Plan: 1. Necrotic foot wounds- VVS following. Angiogram 5/18 Cont Vanc and zosyn- tmax 100.4 overnight  2. ESRD - TTS [facility]. HD tomorrow. K+ 3.3 3. Hypertension/volume - 120/58- no BP meds/no volume excess. Need wts 4. Anemia - hgb 8.6- last micera 5/12- start ESA, hgb has been slowly dropping down from 11.6 on 4/7- cont weekly Fe 5. Metabolic bone disease - Ca+8.3 phos 4.3- cont binders. Last pth 267- cont calcitriol 6. Nutrition -renal diet. alb 2.2. Multi vit 7. DM- per primary.  Jetty DuhamelBridget Whelan, NP [facility] Beeper [phone] 12/06/2014,9:04 AM  LOS: 2 days   Pt seen, examined and agree w A/P as above.  Vinson Moselleob Sandeep Radell MD pager [phone]    cell [phone] 12/06/2014, 11:07 AM    Additional Objective Labs: Basic Metabolic Panel:  Recent Labs Lab 12/04/14 1810 12/05/14 0430  NA 138 138  K 3.1* 3.3*  CL 91* 96*  CO2 33* 32  GLUCOSE 106* 145*  BUN 7 14  CREATININE 2.71* 3.90*  CALCIUM 8.4* 8.3*  PHOS  --  4.3   Liver Function Tests:  Recent Labs Lab 12/04/14 1810 12/05/14 0430  AST 19 16  ALT 10* 8*  ALKPHOS 67 52  BILITOT 0.5 0.6  PROT 8.5* 6.3*  ALBUMIN 2.9* 2.2*   No results for input(s): LIPASE, AMYLASE in the last 168  hours. CBC:  Recent Labs Lab 12/04/14 1810 12/05/14 0430  WBC 11.1* 8.1  NEUTROABS 8.3*  --   HGB 9.7* 8.6*  HCT 31.4* 27.6*  MCV 93.5 94.5  PLT 297 263   Blood Culture    Component Value Date/Time   SDES WOUND 12/04/2014 1807   SPECREQUEST RIGHT FOOT 12/04/2014 1807   CULT  12/04/2014 1807    Culture reincubated for better growth Performed at [facility]    REPTSTATUS PENDING 12/04/2014 1807    Cardiac Enzymes:  Recent Labs Lab 12/04/14 2201 12/05/14 0430 12/05/14 0916  TROPONINI 0.03 0.03 0.03   CBG:  Recent Labs Lab 12/05/14 1147 12/05/14 1621 12/05/14 1721 12/05/14 2011 12/06/14 0002  GLUCAP 83 77 73 223* 168*   Iron Studies: No results for input(s): IRON, TIBC, TRANSFERRIN, FERRITIN in the last 72 hours. @lablastinr3 @ Studies/Results: No results found. Medications:   . calcitRIOL  1 mcg Oral Q T,Th,Sa-HD  . darbepoetin (ARANESP) injection - DIALYSIS  150 mcg Intravenous Q Thu-HD  . ferric gluconate (FERRLECIT/NULECIT) IV  62.5 mg Intravenous Q Thu-HD  . heparin  5,000 Units Subcutaneous 3 times per day  . insulin aspart  0-9 Units Subcutaneous 6 times per day  . insulin detemir  5 Units Subcutaneous QHS  .  lanthanum  1,000 mg Oral TID WC  . piperacillin-tazobactam (ZOSYN)  IV  2.25 g Intravenous 3 times per day  . pregabalin  50 mg Oral TID  . sodium chloride  3 mL Intravenous Q12H  . sodium chloride  3 mL Intravenous Q12H  . vancomycin  500 mg Intravenous Q T,Th,Sa-HD

## 2014-12-06 NOTE — Progress Notes (Signed)
    Subjective  - POD #1, status post angiography and attempted angioplasty  The patient is in good spirits today.  Her sons are at the bedside   Physical Exam:  Slightly more dry appearance of the wounds, particularly on the right.  No gross purulence.       Assessment/Plan:    I spent probably 30 minutes talking with the patient and her family.  I reiterated that she is at high risk for bilateral amputation, most likely a below knee amputation on the left and possibly a transmetatarsal on the right, but most likely a below knee amputation as well on the right.  We discussed the quality of life issues after this as well as the need for new living arrangements.  Currently, there is no gross purulence associated with the wounds.  They looked pretty dry.  I think that we can attempt a trial of wound care, understanding that this will likely not be successful, but potentially could postpone or delay amputation.  I [facility]  I would also have the wound care team evaluate the wounds for outpatient wound care.  She should continue on antibiotics, most likely with dialysis.  I'm scheduling her to see me in the office in 3 weeks for wound check.  Durene CalBrabham, [place] 12/06/2014 11:07 AM --  Filed Vitals:   12/06/14 0947  BP: 109/56  Pulse: 67  Temp: 98.1 F (36.7 C)  Resp: 16    Intake/Output Summary (Last 24 hours) at 12/06/14 1107 Last data filed at 12/06/14 0600  Gross per 24 hour  Intake    630 ml  Output      0 ml  Net    630 ml     Laboratory CBC    Component Value Date/Time   WBC 8.1 12/05/2014 0430   HGB 8.6* 12/05/2014 0430   HCT 27.6* 12/05/2014 0430   PLT 263 12/05/2014 0430    BMET    Component Value Date/Time   NA 138 12/05/2014 0430   K 3.3* 12/05/2014 0430   CL 96* 12/05/2014 0430   CO2 32 12/05/2014 0430   GLUCOSE 145* 12/05/2014 0430   BUN 14 12/05/2014 0430   CREATININE 3.90* 12/05/2014  0430   CALCIUM 8.3* 12/05/2014 0430   CALCIUM 7.8* 11/05/2009 0520   GFRNONAA 10* 12/05/2014 0430   GFRAA 12* 12/05/2014 0430    COAG Lab Results  Component Value Date   INR 1.30 04/19/2013   No results found for: PTT  Antibiotics Anti-infectives    Start     Dose/Rate Route Frequency Ordered Stop   12/06/14 1200  vancomycin (VANCOCIN) 500 mg in sodium chloride 0.9 % 100 mL IVPB     500 mg 100 mL/hr over 60 Minutes Intravenous Every T-Th-Sa (Hemodialysis) 12/04/14 2141     12/05/14 0000  vancomycin (VANCOCIN) 1,250 mg in sodium chloride 0.9 % 250 mL IVPB     1,250 mg 166.7 mL/hr over 90 Minutes Intravenous  Once 12/04/14 2141 12/05/14 0219   12/04/14 2200  piperacillin-tazobactam (ZOSYN) IVPB 2.25 g     2.25 g 100 mL/hr over 30 Minutes Intravenous 3 times per day 12/04/14 2139         V. Charlena CrossWells Daiana Vitiello IV, M.D. [facility] Office: [phone] Pager:  [phone]

## 2014-12-06 NOTE — Care Management Note (Signed)
Case Management Note  Patient Details  Name: Jackie [name] MRN: [id number] Date of Birth: 09/13/1934  Subjective/Objective:        CM following for progression and d/c planning.            Action/Plan: Appointment scheduled with Wound Care Center   Expected Discharge Date:                  Expected Discharge Plan:  Home w Home Health Services  In-House Referral:     Discharge planning Services  CM Consult  Post Acute Care Choice:    Choice offered to:     DME Arranged:    DME Agency:     HH Arranged:  RN, PT, OT HH Agency:     Status of Service:  In process, will continue to follow  Medicare Important Message Given:    Date Medicare IM Given:    Medicare IM give by:    Date Additional Medicare IM Given:    Additional Medicare Important Message give by:     If discussed at Long Length of Stay Meetings, dates discussed:    Additional Comments:  Starlyn SkeansRoyal, Kaleab Frasier U, RN 12/06/2014, 4:24 PM

## 2014-12-06 NOTE — Consult Note (Addendum)
WOC consult requested for bilat foot wounds. Pt has been assessed by VVS service and they recommend follow-up at the outpatient wound care center after discharge; this needs to be arranged by physician referral. WOC assistance is requested for topical treatment of eschar wounds, which may eventually require an amputation according to their progress notes.   Left foot with full thickness wound;  eschar 16X7cm with loose skin at the edges, strong foul odor, no drainage.   Right foot with full thickness wound;  eschar 6X7cm with loose skin at the edges and depth of .2cm, strong foul odor, no drainage.  Continue previous plan of care with Silvadene and gauze dressings.  Pt verbalizes understanding regarding plan of care is states she is aware of the limited potential for wound healing. Please re-consult if further assistance is needed.  Thank-you,  Cammie Mcgeeawn Jakaiden Fill MSN, RN, CWOCN, [place]AP, CNS [phone]

## 2014-12-07 ENCOUNTER — Encounter (HOSPITAL_COMMUNITY): Payer: Self-pay | Admitting: Vascular Surgery

## 2014-12-07 LAB — GLUCOSE, CAPILLARY
Glucose-Capillary: 89 mg/dL (ref 65–99)
Glucose-Capillary: 119 mg/dL — ABNORMAL HIGH (ref 65–99)

## 2014-12-07 MED ORDER — INSULIN DETEMIR 100 UNIT/ML ~~LOC~~ SOLN: 10.0000 [IU] | Freq: Every day | SUBCUTANEOUS | Status: DC

## 2014-12-07 MED ORDER — SILVER SULFADIAZINE 1 % EX CREA: TOPICAL_CREAM | Freq: Every day | CUTANEOUS | Status: DC

## 2014-12-07 MED ORDER — SACCHAROMYCES BOULARDII 250 MG PO CAPS: 250.0000 mg | ORAL_CAPSULE | Freq: Two times a day (BID) | ORAL | Status: DC

## 2014-12-07 MED ORDER — VANCOMYCIN HCL 500 MG IV SOLR: 500.0000 mg | INTRAVENOUS | Status: DC

## 2014-12-07 MED ORDER — CALCITRIOL 0.5 MCG PO CAPS: 1.0000 ug | ORAL_CAPSULE | ORAL | Status: DC

## 2014-12-07 MED ORDER — DEXTROSE 5 % IV SOLN: 2.0000 g | INTRAVENOUS | Status: DC

## 2014-12-07 MED ORDER — SACCHAROMYCES BOULARDII 250 MG PO CAPS
250.0000 mg | ORAL_CAPSULE | Freq: Two times a day (BID) | ORAL | Status: DC
  Administered 2014-12-07: 250 mg via ORAL
  Filled 2014-12-07 (×2): qty 1

## 2014-12-07 MED ORDER — DEXTROSE 5 % IV SOLN
1.0000 g | Freq: Once | INTRAVENOUS | Status: AC
  Administered 2014-12-07: 1 g via INTRAVENOUS
  Filled 2014-12-07: qty 1

## 2014-12-07 MED ORDER — OXYCODONE HCL 5 MG PO TABS: 5.0000 mg | ORAL_TABLET | ORAL | Status: DC | PRN

## 2014-12-07 NOTE — Evaluation (Deleted)
Physical Therapy Evaluation Patient Details Name: Jackie [name] MRN: [id number] DOB: 01/20/1935 Today's Date: 12/07/2014   History of Present Illness  80 year old African-American female with past medical history of hypertension, diabetes, end-stage renal disease on dialysis Tuesday, Thursday, Saturday, presented after she was sent over from dialysis center for ulcers on her feet. Apparently patient developed blisters a few weeks ago which she popped with a needle. The wound has been foul-smelling  Clinical Impression  Pt was incontinent of stool but did most of the cleaning herself.  Pt walked 200 feet with RW and min guard - occasional cues for safety.  Pt plans to go home iwht HH.    Follow Up Recommendations Home health PT;Supervision - Intermittent    Equipment Recommendations  None recommended by PT    Recommendations for Other Services       Precautions / Restrictions Precautions Precautions: Fall Restrictions Weight Bearing Restrictions: No Other Position/Activity Restrictions: Pt educated on importance of wearing shoes when up, not too much pressure on feet, elevating feet and floating heels when sitting or laying      Mobility  Bed Mobility Overal bed mobility: Independent                Transfers Overall transfer level: Needs assistance Equipment used: Rolling walker (2 wheeled) Transfers: Sit to/from Stand Sit to Stand: Supervision         General transfer comment: +  Ambulation/Gait Ambulation/Gait assistance: Min guard   Assistive device: Rolling walker (2 wheeled) Gait Pattern/deviations: Step-through pattern;Trunk flexed     General Gait Details: pt reminded to stay close to walker, stand erect (Her back got tired with walking) and to make sure her feet stay inside her walker when turning  Stairs            Wheelchair Mobility    Modified Rankin (Stroke Patients Only)       Balance                                             Pertinent Vitals/Pain Pain Assessment: No/denies pain    Home Living Family/patient expects to be discharged to:: Private residence Living Arrangements: Alone   Type of Home: Apartment Home Access: Level entry     Home Layout: One level Home Equipment: Environmental consultantWalker - 2 wheels;Cane - single point Additional Comments: pt has ride to HD     Prior Function Level of Independence: Independent         Comments: pts grandson takes her grocery shopping     Hand Dominance        Extremity/Trunk Assessment   Upper Extremity Assessment: Defer to OT evaluation           Lower Extremity Assessment: Overall WFL for tasks assessed      Cervical / Trunk Assessment: Normal  Communication   Communication: No difficulties  Cognition Arousal/Alertness: Awake/alert Behavior During Therapy: WFL for tasks assessed/performed (pt independent - wanting to clean herself)                        General Comments      Exercises        Assessment/Plan    PT Assessment Patient needs continued PT services  PT Diagnosis Generalized weakness   PT Problem List Decreased activity tolerance  PT Treatment Interventions Functional mobility training  PT Goals (Current goals can be found in the Care Plan section) Acute Rehab PT Goals Patient Stated Goal: to get home and get legs to heal PT Goal Formulation: With patient Time For Goal Achievement: 12/14/14 Potential to Achieve Goals: Good    Frequency Min 3X/week   Barriers to discharge Decreased caregiver support      Co-evaluation               End of Session Equipment Utilized During Treatment: Gait belt Activity Tolerance: Patient tolerated treatment well Patient left: in chair;with call bell/phone within reach           Time: 1330-1410 PT Time Calculation (min) (ACUTE ONLY): 40 min   Charges:   PT Evaluation $Initial PT Evaluation Tier I: 1 Procedure PT Treatments $Gait Training:  8-22 mins $Therapeutic Activity: 8-22 mins   PT G Codes:        Jackie [name], Jackie [name] 12/07/2014, 2:30 PM  Jackie [name], PT

## 2014-12-07 NOTE — Care Management Note (Signed)
Case Management Note  Patient Details  Name: Jackie [name] MRN: [id number] Date of Birth: September 09, 1934  Subjective/Objective:                 CM following for progression and d/c planning.   Action/Plan: Met with pt who selected [place] for [facility] services. [place], notified.  No DME needs per pt   Expected Discharge Date:        12/07/2014          Expected Discharge Plan:  [place]  In-House Referral:     Discharge planning Services  CM Consult  Post Acute Care Choice:    Choice offered to:   pt  DME Arranged:    DME Agency:     HH Arranged:  HHRN HH Agency:  [place]  Status of Service:  Completed, signed off  Medicare Important Message Given:  Yes Date Medicare IM Given:  12/07/14 Medicare IM give by:  Jasmine Pang RN MPH, case manager, [phone] Date Additional Medicare IM Given:    Additional Medicare Important Message give by:     If discussed at [place] of Stay Meetings, dates discussed:    Additional Comments:  Adron Bene, RN 12/07/2014, 2:16 PM

## 2014-12-07 NOTE — Evaluation (Signed)
Physical Therapy Evaluation Patient Details Name: Jackie [name] MRN: [id number] DOB: 03/30/1935 Today's Date: 12/07/2014   History of Present Illness  80 year old African-American female with past medical history of hypertension, diabetes, end-stage renal disease on dialysis Tuesday, Thursday, Saturday, presented after she was sent over from dialysis center for ulcers on her feet. Apparently patient developed blisters a few weeks ago which she popped with a needle. The wound has been foul-smelling  Clinical Impression  Pt was incontinent of stool during therapy.  She was able to do most of the cleaning herself.  Pt walked 200 feet down the hall with RW and min guard.  Minimal  Verbal cues for safety.  [facility]  Pt will continue with HD on T TH and Sat.    Follow Up Recommendations Home health PT;Supervision - Intermittent    Equipment Recommendations  None recommended by PT    Recommendations for Other Services       Precautions / Restrictions Precautions Precautions: Fall Restrictions Weight Bearing Restrictions: No Other Position/Activity Restrictions: Pt educated on importance of wearing shoes when up, not too much pressure on feet, elevating feet and floating heels when sitting or laying      Mobility  Bed Mobility Overal bed mobility: Independent                Transfers Overall transfer level: Needs assistance Equipment used: Rolling walker (2 wheeled) Transfers: Sit to/from Stand Sit to Stand: Supervision         General transfer comment: +  Ambulation/Gait Ambulation/Gait assistance: Min guard   Assistive device: Rolling walker (2 wheeled) Gait Pattern/deviations: Step-through pattern;Trunk flexed     General Gait Details: pt reminded to stay close to walker, stand erect (Her back got tired with walking) and to make sure her feet stay inside her walker when turning  Stairs            Wheelchair Mobility    Modified  Rankin (Stroke Patients Only)       Balance                                             Pertinent Vitals/Pain Pain Assessment: No/denies pain    Home Living Family/patient expects to be discharged to:: Private residence Living Arrangements: Alone   Type of Home: Apartment Home Access: Level entry     Home Layout: One level Home Equipment: Environmental consultantWalker - 2 wheels;Cane - single point Additional Comments: pt has ride to HD     Prior Function Level of Independence: Independent         Comments: pts grandson takes her grocery shopping     Hand Dominance        Extremity/Trunk Assessment   Upper Extremity Assessment: Defer to OT evaluation           Lower Extremity Assessment: Overall WFL for tasks assessed      Cervical / Trunk Assessment: Normal  Communication   Communication: No difficulties  Cognition Arousal/Alertness: Awake/alert Behavior During Therapy: WFL for tasks assessed/performed (pt independent - wanting to clean herself)                        General Comments      Exercises        Assessment/Plan    PT Assessment Patient needs continued  PT services  PT Diagnosis Generalized weakness   PT Problem List Decreased activity tolerance  PT Treatment Interventions Functional mobility training   PT Goals (Current goals can be found in the Care Plan section) Acute Rehab PT Goals Patient Stated Goal: to get home and get legs to heal PT Goal Formulation: With patient Time For Goal Achievement: 12/14/14 Potential to Achieve Goals: Good    Frequency Min 3X/week   Barriers to discharge Decreased caregiver support      Co-evaluation               End of Session Equipment Utilized During Treatment: Gait belt Activity Tolerance: Patient tolerated treatment well Patient left: in chair;with call bell/phone within reach           Time: 1330-1410 PT Time Calculation (min) (ACUTE ONLY): 40 min   Charges:      PT Treatments $Gait Training: 23-37 mins $Therapeutic Activity: 8-22 mins   PT G Codes:        Judson RochHildreth, Jackie [name] 12/07/2014, 2:21 PM  Jackie [name], PT

## 2014-12-07 NOTE — Evaluation (Signed)
Occupational Therapy Evaluation Patient Details Name: Jackie [name] MRN: [id number] DOB: 01/09/1935 Today's Date: 12/07/2014    History of Present Illness 80 year old African-American female with past medical history of hypertension, diabetes, end-stage renal disease on dialysis Tuesday, Thursday, Saturday, presented after she was sent over from dialysis center for ulcers on her feet. Apparently patient developed blisters a few weeks ago which she popped with a needle. The wound has been foul-smelling   Clinical Impression   Pt performing ADL at a modified independent to supervision level with RW. Instructed pt to continue use of RW for safety.     Follow Up Recommendations  No OT follow up;Supervision - Intermittent    Equipment Recommendations  None recommended by OT    Recommendations for Other Services       Precautions / Restrictions Precautions Precautions: Fall Restrictions Weight Bearing Restrictions: No Other Position/Activity Restrictions: reinforced education in protecting, monitoring and elevating LEs      Mobility Bed Mobility Pt up in chair                Transfers Overall transfer level: Needs assistance Equipment used: Rolling walker (2 wheeled) Transfers: Sit to/from Stand Sit to Stand: Supervision         General transfer comment: cues for safety     Balance                                            ADL Overall ADL's : Needs assistance/impaired Eating/Feeding: Independent;Sitting   Grooming: Wash/dry hands;Modified independent;Standing   Upper Body Bathing: Set up;Sitting   Lower Body Bathing: Sit to/from stand;Modified independent   Upper Body Dressing : Set up;Sitting   Lower Body Dressing: Supervision/safety;Sit to/from stand   Toilet Transfer: Retail bankerupervision/safety;Regular Toilet;Ambulation;Grab bars;RW   Toileting- ArchitectClothing Manipulation and Hygiene: Supervision/safety;Sit to/from stand        Functional mobility during ADLs: Supervision/safety;Rolling walker General ADL Comments: Pt able to reach her feet by crossing it over her opposite knee. Instructed in transporting items safely with RW.     Vision     Perception     Praxis      Pertinent Vitals/Pain Pain Assessment: No/denies pain     Hand Dominance Right   Extremity/Trunk Assessment Upper Extremity Assessment Upper Extremity Assessment: Overall WFL for tasks assessed   Lower Extremity Assessment Lower Extremity Assessment: Defer to PT evaluation   Cervical / Trunk Assessment Cervical / Trunk Assessment: Normal   Communication Communication Communication: No difficulties   Cognition Arousal/Alertness: Awake/alert Behavior During Therapy: WFL for tasks assessed/performed Overall Cognitive Status: Within Functional Limits for tasks assessed (pt able to recall education from PT session)                     General Comments       Exercises       Shoulder Instructions      Home Living Family/patient expects to be discharged to:: Private residence Living Arrangements: Alone Available Help at Discharge: Family;Available PRN/intermittently Type of Home: Apartment Home Access: Level entry     Home Layout: One level     Bathroom Shower/Tub: Chief Strategy OfficerTub/shower unit   Bathroom Toilet: Standard     Home Equipment: Environmental consultantWalker - 2 wheels;Cane - single point;Shower seat;Grab bars - tub/shower   Additional Comments: pt has ride to HD       Prior Functioning/Environment Level  of Independence: Independent        Comments: pts grandson takes her grocery shopping    OT Diagnosis:     OT Problem List:     OT Treatment/Interventions:      OT Goals(Current goals can be found in the care plan section) Acute Rehab OT Goals Patient Stated Goal: to get home and get legs to heal  OT Frequency:     Barriers to D/C:            Co-evaluation              End of Session Equipment Utilized  During Treatment: Rolling walker  Activity Tolerance: Patient tolerated treatment well Patient left: in chair;with call bell/phone within reach   Time: 1410-1425 OT Time Calculation (min): 15 min Charges:  OT General Charges $OT Visit: 1 Procedure OT Evaluation $Initial OT Evaluation Tier I: 1 Procedure G-Codes:    Evern BioMayberry, Lyndsi Altic Lynn 12/07/2014, 2:43 PM  [phone]

## 2014-12-07 NOTE — Progress Notes (Signed)
ANTIBIOTIC CONSULT NOTE - FOLLOW UP  Pharmacy Consult for Vancomycin and Ceftazidime Indication: Possible wound infection  No Known Allergies  Patient Measurements: Height: 5\' 8"  (172.7 cm) Weight: 127 lb (57.607 kg) IBW/kg (Calculated) : 63.9  Vital Signs: Temp: 98.9 F (37.2 C) (05/20 0745) Temp Source: Oral (05/20 0745) BP: 111/46 mmHg (05/20 0745) Pulse Rate: 75 (05/20 0745) Intake/Output from previous day: 05/19 0701 - 05/20 0700 In: 360 [P.O.:360] Out: 2002 [Stool:1] Intake/Output from this shift:    Labs:  Recent Labs  12/04/14 1810 12/05/14 0430 12/06/14 1148  WBC 11.1* 8.1 8.0  HGB 9.7* 8.6* 8.0*  PLT 297 263 288  CREATININE 2.71* 3.90* 6.92*   Estimated Creatinine Clearance: 6.1 mL/min (by C-G formula based on Cr of 6.92). No results for input(s): VANCOTROUGH, VANCOPEAK, VANCORANDOM, GENTTROUGH, GENTPEAK, GENTRANDOM, TOBRATROUGH, TOBRAPEAK, TOBRARND, AMIKACINPEAK, AMIKACINTROU, AMIKACIN in the last 72 hours.   Microbiology: Recent Results (from the past 720 hour(s))  Wound culture     Status: None (Preliminary result)   Collection Time: 12/04/14  6:07 PM  Result Value Ref Range Status   Specimen Description WOUND  Final   Special Requests RIGHT FOOT  Final   Gram Stain   Final    NO WBC SEEN NO SQUAMOUS EPITHELIAL CELLS SEEN FEW GRAM POSITIVE COCCI IN PAIRS [facility] at [facility]    Culture   Final    Culture reincubated for better growth Performed at [facility]    Report Status PENDING  Incomplete  MRSA PCR Screening     Status: None   Collection Time: 12/04/14 11:04 PM  Result Value Ref Range Status   MRSA by PCR NEGATIVE NEGATIVE Final    Comment:        The GeneXpert MRSA Assay (FDA approved for NASAL specimens only), is one component of a comprehensive MRSA colonization surveillance program. It is not intended to diagnose MRSA infection nor to guide or monitor treatment for MRSA infections.      Anti-infectives    Start     Dose/Rate Route Frequency Ordered Stop   12/08/14 1200  cefTAZidime (FORTAZ) 2 g in dextrose 5 % 50 mL IVPB     2 g 100 mL/hr over 30 Minutes Intravenous Every T-Th-Sa (Hemodialysis) 12/07/14 0829     12/07/14 1400  cefTAZidime (FORTAZ) 1 g in dextrose 5 % 50 mL IVPB     1 g 100 mL/hr over 30 Minutes Intravenous  Once 12/07/14 0827     12/06/14 1200  vancomycin (VANCOCIN) 500 mg in sodium chloride 0.9 % 100 mL IVPB     500 mg 100 mL/hr over 60 Minutes Intravenous Every T-Th-Sa (Hemodialysis) 12/04/14 2141     12/05/14 0000  vancomycin (VANCOCIN) 1,250 mg in sodium chloride 0.9 % 250 mL IVPB     1,250 mg 166.7 mL/hr over 90 Minutes Intravenous  Once 12/04/14 2141 12/05/14 0219   12/04/14 2200  piperacillin-tazobactam (ZOSYN) IVPB 2.25 g  Status:  Discontinued     2.25 g 100 mL/hr over 30 Minutes Intravenous 3 times per day 12/04/14 2139 12/07/14 0820      Assessment: 80 yo F presented with blisters on bilateral extremities with drainage. Found to have necrosis of both feet and pharmacy consulted to start empiric antibiotics. Now day #3 of abx. Tmax/24h is 100.9, WBC wnl. Pt is on ESRD-TTS. Now to switch from zosyn to ceftazidime with the plan to trial wound care before going to amputation.  Goal of Therapy:  Pre-HD  vancomycin level 15-25 mcg/mL  Plan:  Stop zosyn Start ceftazidime 1g IV x 1 today, then start 2g IV QHD-TTS Continue vancomycin 500mg  IV QHD-TTS Monitor clinical picture, HD sessions, VT prn F/u cx, LOT  Adalynd Donahoe J 12/07/2014,8:34 AM

## 2014-12-07 NOTE — Progress Notes (Signed)
[facility] Progress Note  Assessment/Plan: 1. Necrotic foot wounds- [facility] following. Angiogram 5/18 Cont Vanc and zosyn- tmax 100.9 overnight ; wound culture [facility] pending - primary states she is for probable d/c and then f/u with Dr. Sammie BenchBarbaham - continue Vanc and Fotaz 3 weeks today 2. ESRD - TTS [facility]. - HD orders written for Sat in case she is not d/c'd 3. Hypertension/volume - ok net UF 2 L with post weight 56.7 Thursday 4. Anemia - hgb 8.6 down to 8 pre HD 5/19- last micera 5/12- Aranesp 150 given 5/19 hgb has been slowly dropping down from 11.6 on 4/7- cont weekly Fe 5. Metabolic bone disease - controlled- cont binders. Last pth 267- cont calcitriol 6. Nutrition -renal diet. alb 2.2. Multi vit - need to be sure she is on ONSP at her outpt HD center 7. DM- per primary.  Sheffield SliderMartha B Bergman, PA-C [facility] Beeper [phone] 12/07/2014,9:48 AM  LOS: 3 days   Pt seen, examined and agree w A/P as above.  Vinson Moselleob Dontea Corlew MD pager [phone]    cell [phone] 12/07/2014, 12:44 PM    Subjective:   No c/o  Objective Filed Vitals:   12/06/14 1700 12/06/14 2012 12/07/14 0431 12/07/14 0745  BP: 135/60 111/50 113/44 111/46  Pulse: 88 87 73 75  Temp: 98.2 F (36.8 C) 98 F (36.7 C) 100.9 F (38.3 C) 98.9 F (37.2 C)  TempSrc: Oral   Oral  Resp:  18 19 20   Height:      Weight:  57.607 kg (127 lb)    SpO2: 100% 100% 100% 100%   Physical Exam General: alert and talkative Heart: RRR Lungs: no rales Abdomen: soft NT Extremities: no sig edema, feet wrapped Dialysis Access: right upper AVF + bruit  Dialysis Orders: [facility] 4hrs 57.5 kgs 2K/2Ca+ 1800u heparin Micera 100 q 2 weeks - last 5/12. Venofer 50 q week Calcitriol 1   Additional Objective Labs: Basic Metabolic Panel:  Recent Labs Lab 12/04/14 1810 12/05/14 0430 12/06/14 1148  NA 138 138 135  K 3.1* 3.3* 3.9  CL 91* 96* 94*  CO2 33* 32 29  GLUCOSE 106* 145* 163*  BUN 7 14 35*   CREATININE 2.71* 3.90* 6.92*  CALCIUM 8.4* 8.3* 8.1*  PHOS  --  4.3  --    Liver Function Tests:  Recent Labs Lab 12/04/14 1810 12/05/14 0430  AST 19 16  ALT 10* 8*  ALKPHOS 67 52  BILITOT 0.5 0.6  PROT 8.5* 6.3*  ALBUMIN 2.9* 2.2*   CBC:  Recent Labs Lab 12/04/14 1810 12/05/14 0430 12/06/14 1148  WBC 11.1* 8.1 8.0  NEUTROABS 8.3*  --   --   HGB 9.7* 8.6* 8.0*  HCT 31.4* 27.6* 25.5*  MCV 93.5 94.5 94.1  PLT 297 263 288   Blood Culture    Component Value Date/Time   SDES WOUND 12/04/2014 1807   SPECREQUEST RIGHT FOOT 12/04/2014 1807   CULT  12/04/2014 1807    Culture reincubated for better growth Performed at [facility]    REPTSTATUS PENDING 12/04/2014 1807    Cardiac Enzymes:  Recent Labs Lab 12/04/14 2201 12/05/14 0430 12/05/14 0916  TROPONINI 0.03 0.03 0.03   CBG:  Recent Labs Lab 12/06/14 0405 12/06/14 0752 12/06/14 1619 12/06/14 2021 12/07/14 0733  GLUCAP 81 90 260* 200* 89  Medications:   . calcitRIOL  1 mcg Oral Q T,Th,Sa-HD  . cefTAZidime (FORTAZ)  IV  1 g Intravenous Once  . [START  ON 12/08/2014] cefTAZidime (FORTAZ)  IV  2 g Intravenous Q T,Th,Sa-HD  . darbepoetin (ARANESP) injection - DIALYSIS  150 mcg Intravenous Q Thu-HD  . ferric gluconate (FERRLECIT/NULECIT) IV  62.5 mg Intravenous Q Thu-HD  . heparin  5,000 Units Subcutaneous 3 times per day  . insulin aspart  0-5 Units Subcutaneous QHS  . insulin aspart  0-9 Units Subcutaneous TID WC  . insulin detemir  5 Units Subcutaneous QHS  . lanthanum  1,000 mg Oral TID WC  . pregabalin  75 mg Oral Daily  . saccharomyces boulardii  250 mg Oral BID  . silver sulfADIAZINE   Topical Daily  . sodium chloride  3 mL Intravenous Q12H  . sodium chloride  3 mL Intravenous Q12H  . vancomycin  500 mg Intravenous Q T,Th,Sa-HD

## 2014-12-07 NOTE — Discharge Instructions (Signed)

## 2014-12-07 NOTE — Discharge Summary (Signed)
[facility]  Physician Discharge Summary   Patient ID: Jackie [name] MRN: [id number] DOB/AGE: [birth year] 78 y.o.  Admit date: 12/04/2014 Discharge date: 12/07/2014  PCP: Evlyn Courier, MD  DISCHARGE DIAGNOSES:  Active Problems:   End stage renal disease   HTN (hypertension), benign   Diabetes mellitus   Anemia   Foot ulcer with necrosis of muscle   RECOMMENDATIONS FOR OUTPATIENT FOLLOW UP: 1. Home health has been arranged 2. Patient to get antibiotics with her dialysis 3. Patient has been made an appointment at the wound care center.   DISCHARGE CONDITION: fair  Diet recommendation: Modified carbohydrate  Filed Weights   12/06/14 1105 12/06/14 1540 12/06/14 2012  Weight: 58.8 kg (129 lb 10.1 oz) 56.7 kg (125 lb) 57.607 kg (127 lb)    INITIAL HISTORY: 80 year old African-American female with past medical history of hypertension, diabetes, end-stage renal disease on dialysis Tuesday, Thursday, Saturday, presented after she was sent over from dialysis center for ulcers on her feet. Apparently patient developed blisters a few weeks ago which she popped with a needle. The wound has been foul-smelling.   Consultants: Vascular surgery. Nephrology.  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 Extensive occlusive disease was noted in the lower extremities. Unfortunately, patient is not a candidate for revascularization procedure. Patient was seen by vascular surgeon Dr. Myra Gianotti. The plan is for wound care for now. He will follow the patient in his office in the near future. If the patient's wound's get worse she will likely need to undergo amputation. She will need to be discharged with antibiotics. Duration will be for at least 3 weeks and then to be  determined by the vascular surgeon. This will be given with her outpatient dialysis sessions. This has been communicated to the nephrology team. They'll make these arrangements.  Essential HTN (hypertension), benign Continue medications   Anemia chronic likely secondary to renal disease Some drop in hemoglobin noted. No overt bleeding. Did not require transfusion  Diabetes mellitus , type II She is noted to have brittle control of her diabetes. HbA1c is 6.3. She was placed on 5 units of Levemir in the hospital. She takes 15 units at home prior to admission. She'll be asked to take 10 units of Levemir at home along with close monitoring of her blood glucose levels.  End stage renal disease on dialysis, TTS Nephrology was consulted. The patient was dialyzed as per her usual schedule.  Abnormal EKG Isolated ST depression but patient denied chest pain or shortness of breath. Troponins were normal.  Overall, stable. Patient was seen by physical and occupational therapy. Home health will be arranged per their recommendations. Wound care center appointment has been made for the patient. In the interim wound care to be provided by home health nurse. Okay for discharge today.  PERTINENT LABS:  The results of significant diagnostics from this hospitalization (including imaging, microbiology, ancillary and laboratory) are listed below for reference.    Microbiology: Recent Results (from the past 240 hour(s))  Wound culture     Status: None (Preliminary result)   Collection Time: 12/04/14  6:07 PM  Result Value Ref Range Status   Specimen Description WOUND  Final   Special Requests RIGHT FOOT  Final   Gram Stain   Final    NO WBC SEEN NO SQUAMOUS EPITHELIAL CELLS SEEN FEW  GRAM POSITIVE COCCI IN PAIRS Performed at [facility]    Culture   Final    Culture reincubated for better growth Performed at [facility]    Report Status PENDING  Incomplete  MRSA PCR Screening      Status: None   Collection Time: 12/04/14 11:04 PM  Result Value Ref Range Status   MRSA by PCR NEGATIVE NEGATIVE Final    Comment:        The GeneXpert MRSA Assay (FDA approved for NASAL specimens only), is one component of a comprehensive MRSA colonization surveillance program. It is not intended to diagnose MRSA infection nor to guide or monitor treatment for MRSA infections.      Labs: Basic Metabolic Panel:  Recent Labs Lab 12/04/14 1810 12/05/14 0430 12/06/14 1148  NA 138 138 135  K 3.1* 3.3* 3.9  CL 91* 96* 94*  CO2 33* 32 29  GLUCOSE 106* 145* 163*  BUN 7 14 35*  CREATININE 2.71* 3.90* 6.92*  CALCIUM 8.4* 8.3* 8.1*  MG  --  2.1  --   PHOS  --  4.3  --    Liver Function Tests:  Recent Labs Lab 12/04/14 1810 12/05/14 0430  AST 19 16  ALT 10* 8*  ALKPHOS 67 52  BILITOT 0.5 0.6  PROT 8.5* 6.3*  ALBUMIN 2.9* 2.2*   CBC:  Recent Labs Lab 12/04/14 1810 12/05/14 0430 12/06/14 1148  WBC 11.1* 8.1 8.0  NEUTROABS 8.3*  --   --   HGB 9.7* 8.6* 8.0*  HCT 31.4* 27.6* 25.5*  MCV 93.5 94.5 94.1  PLT 297 263 288   Cardiac Enzymes:  Recent Labs Lab 12/04/14 2201 12/05/14 0430 12/05/14 0916  TROPONINI 0.03 0.03 0.03   CBG:  Recent Labs Lab 12/06/14 0752 12/06/14 1619 12/06/14 2021 12/07/14 0733 12/07/14 1205  GLUCAP 90 260* 200* 89 119*     IMAGING STUDIES No results found.  DISCHARGE EXAMINATION: Filed Vitals:   12/06/14 1700 12/06/14 2012 12/07/14 0431 12/07/14 0745  BP: 135/60 111/50 113/44 111/46  Pulse: 88 87 73 75  Temp: 98.2 F (36.8 C) 98 F (36.7 C) 100.9 F (38.3 C) 98.9 F (37.2 C)  TempSrc: Oral   Oral  Resp:  Height:      Weight:  57.607 kg (127 lb)    SpO2: 100% 100% 100% 100%   General appearance: alert, cooperative, appears stated age and no distress Resp: clear to auscultation bilaterally Cardio: regular rate and rhythm, S1, S2 normal, no murmur, click, rub or gallop GI: soft, non-tender; bowel  sounds normal; no masses,  no organomegaly The ulcers on both her feet appear to be drying up.  DISPOSITION: Home with family  Discharge Instructions    AMB referral to wound care center    Complete by:  As directed   Ulcers bilateral feet. Vascular. Attempt wound care first before consider surgery per Dr. Myra Gianotti. thanks     Call MD for:  difficulty breathing, headache or visual disturbances    Complete by:  As directed      Call MD for:  extreme fatigue    Complete by:  As directed      Call MD for:  persistant dizziness or light-headedness    Complete by:  As directed      Call MD for:  persistant nausea and vomiting    Complete by:  As directed      Call MD for:  redness, tenderness, or  signs of infection (pain, swelling, redness, odor or green/yellow discharge around incision site)    Complete by:  As directed      Call MD for:  severe uncontrolled pain    Complete by:  As directed      Diet Carb Modified    Complete by:  As directed      Discharge instructions    Complete by:  As directed   You will need to follow up with the wound care center as scheduled. They will call if they have an earlier appointment. Home health will be arranged. Antibiotics to be given at your dialysis sessions. Follow-up with Dr. Myra GianottiBrabham in 3 weeks.  You were cared for by a hospitalist during your hospital stay. If you have any questions about your discharge medications or the care you received while you were in the hospital after you are discharged, you can call the unit and asked to speak with the hospitalist on call if the hospitalist that took care of you is not available. Once you are discharged, your primary care physician will handle any further medical issues. Please note that NO REFILLS for any discharge medications will be authorized once you are discharged, as it is imperative that you return to your primary care physician (or establish a relationship with a primary care physician if you do not  have one) for your aftercare needs so that they can reassess your need for medications and monitor your lab values. If you do not have a primary care physician, you can call [phone] for a physician referral.     Increase activity slowly    Complete by:  As directed            ALLERGIES: No Known Allergies    Discharge Medication List as of 12/07/2014  4:42 PM    START taking these medications   Details  calcitRIOL (ROCALTROL) 0.5 MCG capsule Take 2 capsules (1 mcg total) by mouth Every Tuesday,Thursday,and Saturday with dialysis., Starting 12/07/2014, Until Discontinued, Print    cefTAZidime 2 g in dextrose 5 % 50 mL Inject 2 g into the vein Every Tuesday,Thursday,and Saturday with dialysis., Starting 12/08/2014, Until Discontinued, No Print    saccharomyces boulardii (FLORASTOR) 250 MG capsule Take 1 capsule (250 mg total) by mouth 2 (two) times daily., Starting 12/07/2014, Until Discontinued, Print    vancomycin 500 mg in sodium chloride 0.9 % 100 mL Inject 500 mg into the vein Every Tuesday,Thursday,and Saturday with dialysis., Starting 12/07/2014, Until Discontinued, No Print      CONTINUE these medications which have CHANGED   Details  insulin detemir (LEVEMIR) 100 UNIT/ML injection Inject 0.1 mLs (10 Units total) into the skin at bedtime., Starting 12/07/2014, Until Discontinued, No Print    oxyCODONE (ROXICODONE) 5 MG immediate release tablet Take 1 tablet (5 mg total) by mouth every 4 (four) hours as needed for moderate pain., Starting 12/07/2014, Until Discontinued, No Print    silver sulfADIAZINE (SILVADENE) 1 % cream Apply topically daily. Apply Silvadene to bilat foot wounds Q day, then cover with gauze and kerlex.  Remove all previous Silvadene before re-applying more each time., Starting 12/07/2014, Until Discontinued, Print      CONTINUE these medications which have NOT CHANGED   Details  albuterol (PROVENTIL HFA;VENTOLIN HFA) 108 (90 BASE) MCG/ACT inhaler Inhale 1-2 puffs  into the lungs every 6 (six) hours as needed for wheezing., Starting 12/27/2012, Until Discontinued, Print    B Complex-C-Folic Acid (B COMPLEX-VITAMIN C-FOLIC ACID) 1 MG  tablet Take 1 tablet by mouth daily with breakfast., Until Discontinued, Historical Med    FOSRENOL 1000 MG chewable tablet Chew 1,000 mg by mouth 3 (three) times daily with meals. , Starting 01/18/2013, Until Discontinued, Historical Med    insulin aspart (NOVOLOG) 100 UNIT/ML injection Inject 5 Units into the skin daily as needed for high blood sugar. , Until Discontinued, Historical Med    IRON PO Take 1 tablet by mouth daily., Until Discontinued, Historical Med    pregabalin (LYRICA) 50 MG capsule Take 50 mg by mouth 3 (three) times daily as needed. For nerve pain in feet per patient, Until Discontinued, Historical Med       Follow-up Information    Follow up with [facility]             .   Why:  Appointment : Wednesday, December 26, 2014 @ 2:30pm.   Contact information:   [address] [place] [phone]      Follow up with Durene CalBrabham, [place], MD In 3 weeks.   Specialties:  Vascular Surgery, Cardiology   Why:  Office will call you to arrange your appt (sent)   Contact information:   [address] [place] [address] [phone]       [facility], [facility]. Schedule an appointment as soon as possible for a visit in 1 week.   Specialty:  Family Medicine   Why:  post hospitalization follow up   Contact information:   [address] [place] [address] [phone]       TOTAL DISCHARGE TIME: 35 mins  [facility],Latoyna Hird  [facility] Pager [phone]  12/07/2014, 5:26 PM

## 2014-12-08 DIAGNOSIS — D509 Iron deficiency anemia, unspecified: Secondary | ICD-10-CM | POA: Diagnosis not present

## 2014-12-08 DIAGNOSIS — D631 Anemia in chronic kidney disease: Secondary | ICD-10-CM | POA: Diagnosis not present

## 2014-12-08 DIAGNOSIS — N186 End stage renal disease: Secondary | ICD-10-CM | POA: Diagnosis not present

## 2014-12-08 DIAGNOSIS — N2581 Secondary hyperparathyroidism of renal origin: Secondary | ICD-10-CM | POA: Diagnosis not present

## 2014-12-08 DIAGNOSIS — S91309A Unspecified open wound, unspecified foot, initial encounter: Secondary | ICD-10-CM | POA: Diagnosis not present

## 2014-12-08 DIAGNOSIS — E1129 Type 2 diabetes mellitus with other diabetic kidney complication: Secondary | ICD-10-CM | POA: Diagnosis not present

## 2014-12-08 LAB — WOUND CULTURE: Gram Stain: NONE SEEN

## 2014-12-10 ENCOUNTER — Telehealth: Payer: Self-pay | Admitting: Surgery

## 2014-12-10 DIAGNOSIS — Z992 Dependence on renal dialysis: Secondary | ICD-10-CM | POA: Diagnosis not present

## 2014-12-10 DIAGNOSIS — Z9181 History of falling: Secondary | ICD-10-CM | POA: Diagnosis not present

## 2014-12-10 DIAGNOSIS — D649 Anemia, unspecified: Secondary | ICD-10-CM | POA: Diagnosis not present

## 2014-12-10 DIAGNOSIS — N186 End stage renal disease: Secondary | ICD-10-CM | POA: Diagnosis not present

## 2014-12-10 DIAGNOSIS — L97422 Non-pressure chronic ulcer of left heel and midfoot with fat layer exposed: Secondary | ICD-10-CM | POA: Diagnosis not present

## 2014-12-10 DIAGNOSIS — I120 Hypertensive chronic kidney disease with stage 5 chronic kidney disease or end stage renal disease: Secondary | ICD-10-CM | POA: Diagnosis not present

## 2014-12-10 DIAGNOSIS — Z4800 Encounter for change or removal of nonsurgical wound dressing: Secondary | ICD-10-CM | POA: Diagnosis not present

## 2014-12-10 DIAGNOSIS — M1990 Unspecified osteoarthritis, unspecified site: Secondary | ICD-10-CM | POA: Diagnosis not present

## 2014-12-10 DIAGNOSIS — Z794 Long term (current) use of insulin: Secondary | ICD-10-CM | POA: Diagnosis not present

## 2014-12-10 DIAGNOSIS — Z792 Long term (current) use of antibiotics: Secondary | ICD-10-CM | POA: Diagnosis not present

## 2014-12-10 DIAGNOSIS — E11621 Type 2 diabetes mellitus with foot ulcer: Secondary | ICD-10-CM | POA: Diagnosis not present

## 2014-12-10 DIAGNOSIS — L97412 Non-pressure chronic ulcer of right heel and midfoot with fat layer exposed: Secondary | ICD-10-CM | POA: Diagnosis not present

## 2014-12-10 NOTE — Telephone Encounter (Addendum)
-----   Message from Phillips Odorarol S Pullins, RN sent at 12/07/2014 10:33 AM EDT ----- Regarding: needs 3 wk f/u with [facility] for wound check    ----- Message -----    From: Dara LordsSamantha J Rhyne, PA-C    Sent: 12/07/2014   9:51 AM      To: [facility] Charge Pool  Dr. Myra GianottiBrabham wants to see this pt back in 3 weeks with wound check.  Thanks, Samantha  12/10/14: no answer, mailed letter

## 2014-12-11 DIAGNOSIS — E1129 Type 2 diabetes mellitus with other diabetic kidney complication: Secondary | ICD-10-CM | POA: Diagnosis not present

## 2014-12-11 DIAGNOSIS — D509 Iron deficiency anemia, unspecified: Secondary | ICD-10-CM | POA: Diagnosis not present

## 2014-12-11 DIAGNOSIS — S91309A Unspecified open wound, unspecified foot, initial encounter: Secondary | ICD-10-CM | POA: Diagnosis not present

## 2014-12-11 DIAGNOSIS — N186 End stage renal disease: Secondary | ICD-10-CM | POA: Diagnosis not present

## 2014-12-11 DIAGNOSIS — D631 Anemia in chronic kidney disease: Secondary | ICD-10-CM | POA: Diagnosis not present

## 2014-12-11 DIAGNOSIS — N2581 Secondary hyperparathyroidism of renal origin: Secondary | ICD-10-CM | POA: Diagnosis not present

## 2014-12-12 DIAGNOSIS — N186 End stage renal disease: Secondary | ICD-10-CM | POA: Diagnosis not present

## 2014-12-12 DIAGNOSIS — L97422 Non-pressure chronic ulcer of left heel and midfoot with fat layer exposed: Secondary | ICD-10-CM | POA: Diagnosis not present

## 2014-12-12 DIAGNOSIS — D649 Anemia, unspecified: Secondary | ICD-10-CM | POA: Diagnosis not present

## 2014-12-12 DIAGNOSIS — L97412 Non-pressure chronic ulcer of right heel and midfoot with fat layer exposed: Secondary | ICD-10-CM | POA: Diagnosis not present

## 2014-12-12 DIAGNOSIS — E11621 Type 2 diabetes mellitus with foot ulcer: Secondary | ICD-10-CM | POA: Diagnosis not present

## 2014-12-12 DIAGNOSIS — I120 Hypertensive chronic kidney disease with stage 5 chronic kidney disease or end stage renal disease: Secondary | ICD-10-CM | POA: Diagnosis not present

## 2014-12-12 DIAGNOSIS — E119 Type 2 diabetes mellitus without complications: Secondary | ICD-10-CM | POA: Diagnosis not present

## 2014-12-13 DIAGNOSIS — E1129 Type 2 diabetes mellitus with other diabetic kidney complication: Secondary | ICD-10-CM | POA: Diagnosis not present

## 2014-12-13 DIAGNOSIS — D509 Iron deficiency anemia, unspecified: Secondary | ICD-10-CM | POA: Diagnosis not present

## 2014-12-13 DIAGNOSIS — N186 End stage renal disease: Secondary | ICD-10-CM | POA: Diagnosis not present

## 2014-12-13 DIAGNOSIS — D631 Anemia in chronic kidney disease: Secondary | ICD-10-CM | POA: Diagnosis not present

## 2014-12-13 DIAGNOSIS — S91309A Unspecified open wound, unspecified foot, initial encounter: Secondary | ICD-10-CM | POA: Diagnosis not present

## 2014-12-13 DIAGNOSIS — N2581 Secondary hyperparathyroidism of renal origin: Secondary | ICD-10-CM | POA: Diagnosis not present

## 2014-12-14 DIAGNOSIS — D649 Anemia, unspecified: Secondary | ICD-10-CM | POA: Diagnosis not present

## 2014-12-14 DIAGNOSIS — I120 Hypertensive chronic kidney disease with stage 5 chronic kidney disease or end stage renal disease: Secondary | ICD-10-CM | POA: Diagnosis not present

## 2014-12-14 DIAGNOSIS — N186 End stage renal disease: Secondary | ICD-10-CM | POA: Diagnosis not present

## 2014-12-14 DIAGNOSIS — L97412 Non-pressure chronic ulcer of right heel and midfoot with fat layer exposed: Secondary | ICD-10-CM | POA: Diagnosis not present

## 2014-12-14 DIAGNOSIS — L97422 Non-pressure chronic ulcer of left heel and midfoot with fat layer exposed: Secondary | ICD-10-CM | POA: Diagnosis not present

## 2014-12-14 DIAGNOSIS — E11621 Type 2 diabetes mellitus with foot ulcer: Secondary | ICD-10-CM | POA: Diagnosis not present

## 2014-12-15 DIAGNOSIS — E1129 Type 2 diabetes mellitus with other diabetic kidney complication: Secondary | ICD-10-CM | POA: Diagnosis not present

## 2014-12-15 DIAGNOSIS — D509 Iron deficiency anemia, unspecified: Secondary | ICD-10-CM | POA: Diagnosis not present

## 2014-12-15 DIAGNOSIS — D631 Anemia in chronic kidney disease: Secondary | ICD-10-CM | POA: Diagnosis not present

## 2014-12-15 DIAGNOSIS — N2581 Secondary hyperparathyroidism of renal origin: Secondary | ICD-10-CM | POA: Diagnosis not present

## 2014-12-15 DIAGNOSIS — N186 End stage renal disease: Secondary | ICD-10-CM | POA: Diagnosis not present

## 2014-12-15 DIAGNOSIS — S91309A Unspecified open wound, unspecified foot, initial encounter: Secondary | ICD-10-CM | POA: Diagnosis not present

## 2014-12-17 DIAGNOSIS — I120 Hypertensive chronic kidney disease with stage 5 chronic kidney disease or end stage renal disease: Secondary | ICD-10-CM | POA: Diagnosis not present

## 2014-12-17 DIAGNOSIS — E11621 Type 2 diabetes mellitus with foot ulcer: Secondary | ICD-10-CM | POA: Diagnosis not present

## 2014-12-17 DIAGNOSIS — D649 Anemia, unspecified: Secondary | ICD-10-CM | POA: Diagnosis not present

## 2014-12-17 DIAGNOSIS — L97422 Non-pressure chronic ulcer of left heel and midfoot with fat layer exposed: Secondary | ICD-10-CM | POA: Diagnosis not present

## 2014-12-17 DIAGNOSIS — N186 End stage renal disease: Secondary | ICD-10-CM | POA: Diagnosis not present

## 2014-12-17 DIAGNOSIS — L97412 Non-pressure chronic ulcer of right heel and midfoot with fat layer exposed: Secondary | ICD-10-CM | POA: Diagnosis not present

## 2014-12-18 DIAGNOSIS — E1129 Type 2 diabetes mellitus with other diabetic kidney complication: Secondary | ICD-10-CM | POA: Diagnosis not present

## 2014-12-18 DIAGNOSIS — N186 End stage renal disease: Secondary | ICD-10-CM | POA: Diagnosis not present

## 2014-12-18 DIAGNOSIS — N2581 Secondary hyperparathyroidism of renal origin: Secondary | ICD-10-CM | POA: Diagnosis not present

## 2014-12-18 DIAGNOSIS — D509 Iron deficiency anemia, unspecified: Secondary | ICD-10-CM | POA: Diagnosis not present

## 2014-12-18 DIAGNOSIS — Z992 Dependence on renal dialysis: Secondary | ICD-10-CM | POA: Diagnosis not present

## 2014-12-18 DIAGNOSIS — S91309A Unspecified open wound, unspecified foot, initial encounter: Secondary | ICD-10-CM | POA: Diagnosis not present

## 2014-12-18 DIAGNOSIS — D631 Anemia in chronic kidney disease: Secondary | ICD-10-CM | POA: Diagnosis not present

## 2014-12-19 DIAGNOSIS — D649 Anemia, unspecified: Secondary | ICD-10-CM | POA: Diagnosis not present

## 2014-12-19 DIAGNOSIS — E11621 Type 2 diabetes mellitus with foot ulcer: Secondary | ICD-10-CM | POA: Diagnosis not present

## 2014-12-19 DIAGNOSIS — L97412 Non-pressure chronic ulcer of right heel and midfoot with fat layer exposed: Secondary | ICD-10-CM | POA: Diagnosis not present

## 2014-12-19 DIAGNOSIS — L97422 Non-pressure chronic ulcer of left heel and midfoot with fat layer exposed: Secondary | ICD-10-CM | POA: Diagnosis not present

## 2014-12-19 DIAGNOSIS — N186 End stage renal disease: Secondary | ICD-10-CM | POA: Diagnosis not present

## 2014-12-19 DIAGNOSIS — I120 Hypertensive chronic kidney disease with stage 5 chronic kidney disease or end stage renal disease: Secondary | ICD-10-CM | POA: Diagnosis not present

## 2014-12-20 DIAGNOSIS — D631 Anemia in chronic kidney disease: Secondary | ICD-10-CM | POA: Diagnosis not present

## 2014-12-20 DIAGNOSIS — E1129 Type 2 diabetes mellitus with other diabetic kidney complication: Secondary | ICD-10-CM | POA: Diagnosis not present

## 2014-12-20 DIAGNOSIS — N186 End stage renal disease: Secondary | ICD-10-CM | POA: Diagnosis not present

## 2014-12-20 DIAGNOSIS — N2581 Secondary hyperparathyroidism of renal origin: Secondary | ICD-10-CM | POA: Diagnosis not present

## 2014-12-20 DIAGNOSIS — S91309A Unspecified open wound, unspecified foot, initial encounter: Secondary | ICD-10-CM | POA: Diagnosis not present

## 2014-12-20 DIAGNOSIS — D509 Iron deficiency anemia, unspecified: Secondary | ICD-10-CM | POA: Diagnosis not present

## 2014-12-21 DIAGNOSIS — E11621 Type 2 diabetes mellitus with foot ulcer: Secondary | ICD-10-CM | POA: Diagnosis not present

## 2014-12-21 DIAGNOSIS — L97412 Non-pressure chronic ulcer of right heel and midfoot with fat layer exposed: Secondary | ICD-10-CM | POA: Diagnosis not present

## 2014-12-21 DIAGNOSIS — I120 Hypertensive chronic kidney disease with stage 5 chronic kidney disease or end stage renal disease: Secondary | ICD-10-CM | POA: Diagnosis not present

## 2014-12-21 DIAGNOSIS — N186 End stage renal disease: Secondary | ICD-10-CM | POA: Diagnosis not present

## 2014-12-21 DIAGNOSIS — D649 Anemia, unspecified: Secondary | ICD-10-CM | POA: Diagnosis not present

## 2014-12-21 DIAGNOSIS — L97422 Non-pressure chronic ulcer of left heel and midfoot with fat layer exposed: Secondary | ICD-10-CM | POA: Diagnosis not present

## 2014-12-22 DIAGNOSIS — D631 Anemia in chronic kidney disease: Secondary | ICD-10-CM | POA: Diagnosis not present

## 2014-12-22 DIAGNOSIS — S91309A Unspecified open wound, unspecified foot, initial encounter: Secondary | ICD-10-CM | POA: Diagnosis not present

## 2014-12-22 DIAGNOSIS — N2581 Secondary hyperparathyroidism of renal origin: Secondary | ICD-10-CM | POA: Diagnosis not present

## 2014-12-22 DIAGNOSIS — D509 Iron deficiency anemia, unspecified: Secondary | ICD-10-CM | POA: Diagnosis not present

## 2014-12-22 DIAGNOSIS — E1129 Type 2 diabetes mellitus with other diabetic kidney complication: Secondary | ICD-10-CM | POA: Diagnosis not present

## 2014-12-22 DIAGNOSIS — N186 End stage renal disease: Secondary | ICD-10-CM | POA: Diagnosis not present

## 2014-12-24 DIAGNOSIS — I120 Hypertensive chronic kidney disease with stage 5 chronic kidney disease or end stage renal disease: Secondary | ICD-10-CM | POA: Diagnosis not present

## 2014-12-24 DIAGNOSIS — L97412 Non-pressure chronic ulcer of right heel and midfoot with fat layer exposed: Secondary | ICD-10-CM | POA: Diagnosis not present

## 2014-12-24 DIAGNOSIS — N186 End stage renal disease: Secondary | ICD-10-CM | POA: Diagnosis not present

## 2014-12-24 DIAGNOSIS — L97422 Non-pressure chronic ulcer of left heel and midfoot with fat layer exposed: Secondary | ICD-10-CM | POA: Diagnosis not present

## 2014-12-24 DIAGNOSIS — D649 Anemia, unspecified: Secondary | ICD-10-CM | POA: Diagnosis not present

## 2014-12-24 DIAGNOSIS — E11621 Type 2 diabetes mellitus with foot ulcer: Secondary | ICD-10-CM | POA: Diagnosis not present

## 2014-12-25 DIAGNOSIS — S91309A Unspecified open wound, unspecified foot, initial encounter: Secondary | ICD-10-CM | POA: Diagnosis not present

## 2014-12-25 DIAGNOSIS — N2581 Secondary hyperparathyroidism of renal origin: Secondary | ICD-10-CM | POA: Diagnosis not present

## 2014-12-25 DIAGNOSIS — N186 End stage renal disease: Secondary | ICD-10-CM | POA: Diagnosis not present

## 2014-12-25 DIAGNOSIS — D631 Anemia in chronic kidney disease: Secondary | ICD-10-CM | POA: Diagnosis not present

## 2014-12-25 DIAGNOSIS — D509 Iron deficiency anemia, unspecified: Secondary | ICD-10-CM | POA: Diagnosis not present

## 2014-12-25 DIAGNOSIS — E1129 Type 2 diabetes mellitus with other diabetic kidney complication: Secondary | ICD-10-CM | POA: Diagnosis not present

## 2014-12-26 ENCOUNTER — Encounter: Payer: Self-pay | Admitting: Surgery

## 2014-12-26 ENCOUNTER — Encounter (HOSPITAL_BASED_OUTPATIENT_CLINIC_OR_DEPARTMENT_OTHER): Payer: Medicare Other | Attending: Surgery

## 2014-12-26 DIAGNOSIS — N186 End stage renal disease: Secondary | ICD-10-CM | POA: Insufficient documentation

## 2014-12-26 DIAGNOSIS — I70262 Atherosclerosis of native arteries of extremities with gangrene, left leg: Secondary | ICD-10-CM | POA: Diagnosis not present

## 2014-12-26 DIAGNOSIS — I70235 Atherosclerosis of native arteries of right leg with ulceration of other part of foot: Secondary | ICD-10-CM | POA: Diagnosis not present

## 2014-12-26 DIAGNOSIS — E11621 Type 2 diabetes mellitus with foot ulcer: Secondary | ICD-10-CM | POA: Insufficient documentation

## 2014-12-26 DIAGNOSIS — E1152 Type 2 diabetes mellitus with diabetic peripheral angiopathy with gangrene: Secondary | ICD-10-CM | POA: Insufficient documentation

## 2014-12-26 DIAGNOSIS — E1122 Type 2 diabetes mellitus with diabetic chronic kidney disease: Secondary | ICD-10-CM | POA: Diagnosis not present

## 2014-12-26 DIAGNOSIS — Z992 Dependence on renal dialysis: Secondary | ICD-10-CM | POA: Insufficient documentation

## 2014-12-26 DIAGNOSIS — I120 Hypertensive chronic kidney disease with stage 5 chronic kidney disease or end stage renal disease: Secondary | ICD-10-CM | POA: Diagnosis not present

## 2014-12-26 DIAGNOSIS — L97512 Non-pressure chronic ulcer of other part of right foot with fat layer exposed: Secondary | ICD-10-CM | POA: Insufficient documentation

## 2014-12-26 LAB — GLUCOSE, CAPILLARY: GLUCOSE-CAPILLARY: 79 mg/dL (ref 65–99)

## 2014-12-28 DIAGNOSIS — D649 Anemia, unspecified: Secondary | ICD-10-CM | POA: Diagnosis not present

## 2014-12-28 DIAGNOSIS — E11621 Type 2 diabetes mellitus with foot ulcer: Secondary | ICD-10-CM | POA: Diagnosis not present

## 2014-12-28 DIAGNOSIS — I120 Hypertensive chronic kidney disease with stage 5 chronic kidney disease or end stage renal disease: Secondary | ICD-10-CM | POA: Diagnosis not present

## 2014-12-28 DIAGNOSIS — N186 End stage renal disease: Secondary | ICD-10-CM | POA: Diagnosis not present

## 2014-12-28 DIAGNOSIS — L97412 Non-pressure chronic ulcer of right heel and midfoot with fat layer exposed: Secondary | ICD-10-CM | POA: Diagnosis not present

## 2014-12-28 DIAGNOSIS — L97422 Non-pressure chronic ulcer of left heel and midfoot with fat layer exposed: Secondary | ICD-10-CM | POA: Diagnosis not present

## 2014-12-29 DIAGNOSIS — S91309A Unspecified open wound, unspecified foot, initial encounter: Secondary | ICD-10-CM | POA: Diagnosis not present

## 2014-12-29 DIAGNOSIS — N2581 Secondary hyperparathyroidism of renal origin: Secondary | ICD-10-CM | POA: Diagnosis not present

## 2014-12-29 DIAGNOSIS — N186 End stage renal disease: Secondary | ICD-10-CM | POA: Diagnosis not present

## 2014-12-29 DIAGNOSIS — D631 Anemia in chronic kidney disease: Secondary | ICD-10-CM | POA: Diagnosis not present

## 2014-12-29 DIAGNOSIS — E1129 Type 2 diabetes mellitus with other diabetic kidney complication: Secondary | ICD-10-CM | POA: Diagnosis not present

## 2014-12-29 DIAGNOSIS — D509 Iron deficiency anemia, unspecified: Secondary | ICD-10-CM | POA: Diagnosis not present

## 2014-12-31 ENCOUNTER — Ambulatory Visit (INDEPENDENT_AMBULATORY_CARE_PROVIDER_SITE_OTHER): Payer: Medicare Other | Admitting: Surgery

## 2014-12-31 ENCOUNTER — Encounter: Payer: Self-pay | Admitting: Surgery

## 2014-12-31 VITALS — BP 169/72 | HR 70 | Ht 68.0 in | Wt 132.6 lb

## 2014-12-31 DIAGNOSIS — I739 Peripheral vascular disease, unspecified: Secondary | ICD-10-CM

## 2014-12-31 DIAGNOSIS — L98499 Non-pressure chronic ulcer of skin of other sites with unspecified severity: Secondary | ICD-10-CM

## 2014-12-31 DIAGNOSIS — I7025 Atherosclerosis of native arteries of other extremities with ulceration: Secondary | ICD-10-CM

## 2014-12-31 NOTE — Progress Notes (Signed)
Patient name: Jackie [name] MRN: [id number] DOB: 1935-06-20 Sex: female     Chief Complaint  Patient presents with  . Re-evaluation    3 wk f/u - wound check    HISTORY OF PRESENT ILLNESS: This is a 80 year old female with end-stage renal disease on dialysis Tuesday Thursday Saturday.  She presented to the hospital on April 24 with bilateral lower extremity blisters.  I was very concerned about her wounds when she first presented.  I felt like she was going to require possibly bilateral lower extremity amputations.  She did undergo angiography by Dr. Merrily Pew who attempted right peroneal recanalization.  The patient was discharged to home with plans to follow up at the wound center.  I was wanting to give her legs time to demarcate so the appropriate level of amputation could be determined.  The patient continues to ambulate.  Past Medical History  Diagnosis Date  . Hypertension   . Diabetes mellitus   . Thyroid disease   . Anemia   . Hyperlipidemia   . Arthritis   . End-stage renal disease on hemodialysis     Past Surgical History  Procedure Laterality Date  . Arteriovenous graft placement Left     non function  . Eye surgery Bilateral     catarct  . Cesarean section    . Av fistula placement Right 01/30/2013    Procedure: ARTERIOVENOUS (AV) FISTULA CREATION;  Surgeon: Fransisco Hertz, MD;  Location: [facility] OR;  Service: Vascular;  Laterality: Right;  . Insertion of dialysis catheter Right 04/24/2013    Procedure: INSERTION OF DIALYSIS CATHETER;  Surgeon: Larina Earthly, MD;  Location: [facility] OR;  Service: Vascular;  Laterality: Right;  . Fistula superficialization Right 04/24/2013    Procedure: FISTULA SUPERFICIALIZATION;  Surgeon: Larina Earthly, MD;  Location: [facility] OR;  Service: Vascular;  Laterality: Right;  . Fistulogram Right 07/26/2013    Procedure: FISTULOGRAM;  Surgeon: Larina Earthly, MD;  Location: [facility] CATH LAB;  Service: Cardiovascular;  Laterality: Right;  . Peripheral vascular  catheterization N/A 12/05/2014    Procedure: Abdominal Aortogram;  Surgeon: Fransisco Hertz, MD;  Location: [facility] INVASIVE CV LAB;  Service: Cardiovascular;  Laterality: N/A;    History   Social History  . Marital Status: Widowed    Spouse Name: N/A  . Number of Children: N/A  . Years of Education: N/A   Occupational History  . Not on file.   Social History Main Topics  . Smoking status: Never Smoker   . Smokeless tobacco: Never Used  . Alcohol Use: No  . Drug Use: No  . Sexual Activity: Not on file   Other Topics Concern  . Not on file   Social History Narrative    Family History  Problem Relation Age of Onset  . Diabetes Mother   . Heart disease Mother   . Hypertension Mother     Allergies as of 12/31/2014  . (No Known Allergies)    Current Outpatient Prescriptions on File Prior to Visit  Medication Sig Dispense Refill  . albuterol (PROVENTIL HFA;VENTOLIN HFA) 108 (90 BASE) MCG/ACT inhaler Inhale 1-2 puffs into the lungs every 6 (six) hours as needed for wheezing. 1 Inhaler 0  . B Complex-C-Folic Acid (B COMPLEX-VITAMIN C-FOLIC ACID) 1 MG tablet Take 1 tablet by mouth daily with breakfast.    . calcitRIOL (ROCALTROL) 0.5 MCG capsule Take 2 capsules (1 mcg total) by mouth Every Tuesday,Thursday,and Saturday with dialysis. 30 capsule 0  .  cefTAZidime 2 g in dextrose 5 % 50 mL Inject 2 g into the vein Every Tuesday,Thursday,and Saturday with dialysis.    Marland Kitchen FOSRENOL 1000 MG chewable tablet Chew 1,000 mg by mouth 3 (three) times daily with meals.     . insulin aspart (NOVOLOG) 100 UNIT/ML injection Inject 5 Units into the skin daily as needed for high blood sugar.     . insulin detemir (LEVEMIR) 100 UNIT/ML injection Inject 0.1 mLs (10 Units total) into the skin at bedtime. 10 mL 11  . IRON PO Take 1 tablet by mouth daily.    Marland Kitchen oxyCODONE (ROXICODONE) 5 MG immediate release tablet Take 1 tablet (5 mg total) by mouth every 4 (four) hours as needed for moderate pain. 15 tablet 0    . pregabalin (LYRICA) 50 MG capsule Take 50 mg by mouth 3 (three) times daily as needed. For nerve pain in feet per patient    . saccharomyces boulardii (FLORASTOR) 250 MG capsule Take 1 capsule (250 mg total) by mouth 2 (two) times daily. 30 capsule 0  . silver sulfADIAZINE (SILVADENE) 1 % cream Apply topically daily. Apply Silvadene to bilat foot wounds Q day, then cover with gauze and kerlex.  Remove all previous Silvadene before re-applying more each time. 50 g 0  . vancomycin 500 mg in sodium chloride 0.9 % 100 mL Inject 500 mg into the vein Every Tuesday,Thursday,and Saturday with dialysis.     No current facility-administered medications on file prior to visit.     REVIEW OF SYSTEMS: Cardiovascular: No chest pain, chest pressure, palpitations, orthopnea, or dyspnea on exertion. No claudication or rest pain,  No history of DVT or phlebitis. Pulmonary: No productive cough, asthma or wheezing. Neurologic: No weakness, paresthesias, aphasia, or amaurosis. No dizziness. Hematologic: No bleeding problems or clotting disorders. Musculoskeletal: No joint pain or joint swelling. Gastrointestinal: No blood in stool or hematemesis Genitourinary: On dialysis Psychiatric:: No history of major depression. Integumentary: Bilateral lower extremity ulcers. Constitutional: No fever or chills.  PHYSICAL EXAMINATION:   Vital signs are  Filed Vitals:   12/31/14 1014  BP: 169/72  Pulse: 70  Height: 5\' 8"  (1.727 m)  Weight: 132 lb 9.6 oz (60.147 kg)  SpO2: 94%   Body mass index is 20.17 kg/(m^2). General: The patient appears their stated age. HEENT:  No gross abnormalities Pulmonary:  Non labored breathing Abdomen: Soft and non-tender Musculoskeletal: There are no major deformities. Neurologic: No focal weakness or paresthesias are detected, Skin: Necrotic eschar on the left foot extending from just above the ankle down onto the dorsum of the foot.  The left great and second toe are  mummified.  No extensive cellulitis.  On the right the wounds are resolving.  She still has an open wound on the dorsum of her foot.Marland Kitchen Psychiatric: The patient has normal affect. Cardiovascular: Nonpalpable pedal pulses   Diagnostic Studies None  Assessment: Bilateral lower extremity ulcers Plan: The patient's feet look markedly improved today versus when she was in the hospital.  She potentially could heal the forefoot on the right.  On the left she is at minimum going to lose the great and second toe.  There has been significant improvement in the appearance of the wound.  Hopefully this is something we can now be limited to the foot.  There is no outstanding cellulitis at this time.  I have recommended continuation of wound care since she has had such significant benefit over the past 3 weeks.  I'll have her follow-up  in one month.  At that time we will discuss proceeding with amputation versus continued observation  V. Charlena Cross, M.D. [facility] [place] Office: [phone] Pager:  [phone]

## 2015-01-01 DIAGNOSIS — D631 Anemia in chronic kidney disease: Secondary | ICD-10-CM | POA: Diagnosis not present

## 2015-01-01 DIAGNOSIS — D509 Iron deficiency anemia, unspecified: Secondary | ICD-10-CM | POA: Diagnosis not present

## 2015-01-01 DIAGNOSIS — S91309A Unspecified open wound, unspecified foot, initial encounter: Secondary | ICD-10-CM | POA: Diagnosis not present

## 2015-01-01 DIAGNOSIS — N2581 Secondary hyperparathyroidism of renal origin: Secondary | ICD-10-CM | POA: Diagnosis not present

## 2015-01-01 DIAGNOSIS — N186 End stage renal disease: Secondary | ICD-10-CM | POA: Diagnosis not present

## 2015-01-01 DIAGNOSIS — E1129 Type 2 diabetes mellitus with other diabetic kidney complication: Secondary | ICD-10-CM | POA: Diagnosis not present

## 2015-01-02 ENCOUNTER — Encounter (HOSPITAL_BASED_OUTPATIENT_CLINIC_OR_DEPARTMENT_OTHER): Payer: Medicare Other

## 2015-01-02 DIAGNOSIS — E1122 Type 2 diabetes mellitus with diabetic chronic kidney disease: Secondary | ICD-10-CM | POA: Diagnosis not present

## 2015-01-02 DIAGNOSIS — L97521 Non-pressure chronic ulcer of other part of left foot limited to breakdown of skin: Secondary | ICD-10-CM | POA: Diagnosis not present

## 2015-01-02 DIAGNOSIS — E11621 Type 2 diabetes mellitus with foot ulcer: Secondary | ICD-10-CM | POA: Diagnosis not present

## 2015-01-02 DIAGNOSIS — I120 Hypertensive chronic kidney disease with stage 5 chronic kidney disease or end stage renal disease: Secondary | ICD-10-CM | POA: Diagnosis not present

## 2015-01-02 DIAGNOSIS — L97512 Non-pressure chronic ulcer of other part of right foot with fat layer exposed: Secondary | ICD-10-CM | POA: Diagnosis not present

## 2015-01-02 DIAGNOSIS — I70262 Atherosclerosis of native arteries of extremities with gangrene, left leg: Secondary | ICD-10-CM | POA: Diagnosis not present

## 2015-01-02 DIAGNOSIS — E1152 Type 2 diabetes mellitus with diabetic peripheral angiopathy with gangrene: Secondary | ICD-10-CM | POA: Diagnosis not present

## 2015-01-02 DIAGNOSIS — L97511 Non-pressure chronic ulcer of other part of right foot limited to breakdown of skin: Secondary | ICD-10-CM | POA: Diagnosis not present

## 2015-01-03 DIAGNOSIS — N186 End stage renal disease: Secondary | ICD-10-CM | POA: Diagnosis not present

## 2015-01-03 DIAGNOSIS — S91309A Unspecified open wound, unspecified foot, initial encounter: Secondary | ICD-10-CM | POA: Diagnosis not present

## 2015-01-03 DIAGNOSIS — D631 Anemia in chronic kidney disease: Secondary | ICD-10-CM | POA: Diagnosis not present

## 2015-01-03 DIAGNOSIS — N2581 Secondary hyperparathyroidism of renal origin: Secondary | ICD-10-CM | POA: Diagnosis not present

## 2015-01-03 DIAGNOSIS — E1129 Type 2 diabetes mellitus with other diabetic kidney complication: Secondary | ICD-10-CM | POA: Diagnosis not present

## 2015-01-03 DIAGNOSIS — D509 Iron deficiency anemia, unspecified: Secondary | ICD-10-CM | POA: Diagnosis not present

## 2015-01-05 DIAGNOSIS — D509 Iron deficiency anemia, unspecified: Secondary | ICD-10-CM | POA: Diagnosis not present

## 2015-01-05 DIAGNOSIS — N2581 Secondary hyperparathyroidism of renal origin: Secondary | ICD-10-CM | POA: Diagnosis not present

## 2015-01-05 DIAGNOSIS — N186 End stage renal disease: Secondary | ICD-10-CM | POA: Diagnosis not present

## 2015-01-05 DIAGNOSIS — E1129 Type 2 diabetes mellitus with other diabetic kidney complication: Secondary | ICD-10-CM | POA: Diagnosis not present

## 2015-01-05 DIAGNOSIS — D631 Anemia in chronic kidney disease: Secondary | ICD-10-CM | POA: Diagnosis not present

## 2015-01-05 DIAGNOSIS — S91309A Unspecified open wound, unspecified foot, initial encounter: Secondary | ICD-10-CM | POA: Diagnosis not present

## 2015-01-07 DIAGNOSIS — E11621 Type 2 diabetes mellitus with foot ulcer: Secondary | ICD-10-CM | POA: Diagnosis not present

## 2015-01-07 DIAGNOSIS — L97412 Non-pressure chronic ulcer of right heel and midfoot with fat layer exposed: Secondary | ICD-10-CM | POA: Diagnosis not present

## 2015-01-07 DIAGNOSIS — N186 End stage renal disease: Secondary | ICD-10-CM | POA: Diagnosis not present

## 2015-01-07 DIAGNOSIS — I120 Hypertensive chronic kidney disease with stage 5 chronic kidney disease or end stage renal disease: Secondary | ICD-10-CM | POA: Diagnosis not present

## 2015-01-07 DIAGNOSIS — L97422 Non-pressure chronic ulcer of left heel and midfoot with fat layer exposed: Secondary | ICD-10-CM | POA: Diagnosis not present

## 2015-01-07 DIAGNOSIS — D649 Anemia, unspecified: Secondary | ICD-10-CM | POA: Diagnosis not present

## 2015-01-08 DIAGNOSIS — N186 End stage renal disease: Secondary | ICD-10-CM | POA: Diagnosis not present

## 2015-01-08 DIAGNOSIS — N2581 Secondary hyperparathyroidism of renal origin: Secondary | ICD-10-CM | POA: Diagnosis not present

## 2015-01-08 DIAGNOSIS — D631 Anemia in chronic kidney disease: Secondary | ICD-10-CM | POA: Diagnosis not present

## 2015-01-08 DIAGNOSIS — D509 Iron deficiency anemia, unspecified: Secondary | ICD-10-CM | POA: Diagnosis not present

## 2015-01-08 DIAGNOSIS — S91309A Unspecified open wound, unspecified foot, initial encounter: Secondary | ICD-10-CM | POA: Diagnosis not present

## 2015-01-08 DIAGNOSIS — E1129 Type 2 diabetes mellitus with other diabetic kidney complication: Secondary | ICD-10-CM | POA: Diagnosis not present

## 2015-01-09 DIAGNOSIS — I120 Hypertensive chronic kidney disease with stage 5 chronic kidney disease or end stage renal disease: Secondary | ICD-10-CM | POA: Diagnosis not present

## 2015-01-09 DIAGNOSIS — L97521 Non-pressure chronic ulcer of other part of left foot limited to breakdown of skin: Secondary | ICD-10-CM | POA: Diagnosis not present

## 2015-01-09 DIAGNOSIS — E1152 Type 2 diabetes mellitus with diabetic peripheral angiopathy with gangrene: Secondary | ICD-10-CM | POA: Diagnosis not present

## 2015-01-09 DIAGNOSIS — L97512 Non-pressure chronic ulcer of other part of right foot with fat layer exposed: Secondary | ICD-10-CM | POA: Diagnosis not present

## 2015-01-09 DIAGNOSIS — E1122 Type 2 diabetes mellitus with diabetic chronic kidney disease: Secondary | ICD-10-CM | POA: Diagnosis not present

## 2015-01-09 DIAGNOSIS — E11621 Type 2 diabetes mellitus with foot ulcer: Secondary | ICD-10-CM | POA: Diagnosis not present

## 2015-01-09 DIAGNOSIS — L97511 Non-pressure chronic ulcer of other part of right foot limited to breakdown of skin: Secondary | ICD-10-CM | POA: Diagnosis not present

## 2015-01-09 DIAGNOSIS — I70262 Atherosclerosis of native arteries of extremities with gangrene, left leg: Secondary | ICD-10-CM | POA: Diagnosis not present

## 2015-01-10 DIAGNOSIS — S91309A Unspecified open wound, unspecified foot, initial encounter: Secondary | ICD-10-CM | POA: Diagnosis not present

## 2015-01-10 DIAGNOSIS — E1129 Type 2 diabetes mellitus with other diabetic kidney complication: Secondary | ICD-10-CM | POA: Diagnosis not present

## 2015-01-10 DIAGNOSIS — D509 Iron deficiency anemia, unspecified: Secondary | ICD-10-CM | POA: Diagnosis not present

## 2015-01-10 DIAGNOSIS — N186 End stage renal disease: Secondary | ICD-10-CM | POA: Diagnosis not present

## 2015-01-10 DIAGNOSIS — D631 Anemia in chronic kidney disease: Secondary | ICD-10-CM | POA: Diagnosis not present

## 2015-01-10 DIAGNOSIS — N2581 Secondary hyperparathyroidism of renal origin: Secondary | ICD-10-CM | POA: Diagnosis not present

## 2015-01-12 DIAGNOSIS — D509 Iron deficiency anemia, unspecified: Secondary | ICD-10-CM | POA: Diagnosis not present

## 2015-01-12 DIAGNOSIS — E1129 Type 2 diabetes mellitus with other diabetic kidney complication: Secondary | ICD-10-CM | POA: Diagnosis not present

## 2015-01-12 DIAGNOSIS — D631 Anemia in chronic kidney disease: Secondary | ICD-10-CM | POA: Diagnosis not present

## 2015-01-12 DIAGNOSIS — S91309A Unspecified open wound, unspecified foot, initial encounter: Secondary | ICD-10-CM | POA: Diagnosis not present

## 2015-01-12 DIAGNOSIS — N186 End stage renal disease: Secondary | ICD-10-CM | POA: Diagnosis not present

## 2015-01-12 DIAGNOSIS — N2581 Secondary hyperparathyroidism of renal origin: Secondary | ICD-10-CM | POA: Diagnosis not present

## 2015-01-15 DIAGNOSIS — N2581 Secondary hyperparathyroidism of renal origin: Secondary | ICD-10-CM | POA: Diagnosis not present

## 2015-01-15 DIAGNOSIS — D509 Iron deficiency anemia, unspecified: Secondary | ICD-10-CM | POA: Diagnosis not present

## 2015-01-15 DIAGNOSIS — N186 End stage renal disease: Secondary | ICD-10-CM | POA: Diagnosis not present

## 2015-01-15 DIAGNOSIS — E1129 Type 2 diabetes mellitus with other diabetic kidney complication: Secondary | ICD-10-CM | POA: Diagnosis not present

## 2015-01-15 DIAGNOSIS — S91309A Unspecified open wound, unspecified foot, initial encounter: Secondary | ICD-10-CM | POA: Diagnosis not present

## 2015-01-15 DIAGNOSIS — D631 Anemia in chronic kidney disease: Secondary | ICD-10-CM | POA: Diagnosis not present

## 2015-01-16 ENCOUNTER — Ambulatory Visit (HOSPITAL_COMMUNITY)
Admission: RE | Admit: 2015-01-16 | Discharge: 2015-01-16 | Disposition: A | Discharge status: Home / Self Care | Payer: Medicare Other | Source: Ambulatory Visit | Attending: Surgery | Admitting: Surgery

## 2015-01-16 ENCOUNTER — Ambulatory Visit (HOSPITAL_COMMUNITY): Payer: Medicare Other

## 2015-01-16 ENCOUNTER — Other Ambulatory Visit: Payer: Self-pay | Admitting: Surgery

## 2015-01-16 DIAGNOSIS — E11621 Type 2 diabetes mellitus with foot ulcer: Secondary | ICD-10-CM | POA: Diagnosis not present

## 2015-01-16 DIAGNOSIS — Z794 Long term (current) use of insulin: Secondary | ICD-10-CM | POA: Diagnosis not present

## 2015-01-16 DIAGNOSIS — E1152 Type 2 diabetes mellitus with diabetic peripheral angiopathy with gangrene: Secondary | ICD-10-CM | POA: Diagnosis not present

## 2015-01-16 DIAGNOSIS — I10 Essential (primary) hypertension: Secondary | ICD-10-CM | POA: Diagnosis not present

## 2015-01-16 DIAGNOSIS — I517 Cardiomegaly: Secondary | ICD-10-CM | POA: Insufficient documentation

## 2015-01-16 DIAGNOSIS — S81802A Unspecified open wound, left lower leg, initial encounter: Secondary | ICD-10-CM

## 2015-01-16 DIAGNOSIS — L97511 Non-pressure chronic ulcer of other part of right foot limited to breakdown of skin: Secondary | ICD-10-CM | POA: Diagnosis not present

## 2015-01-16 DIAGNOSIS — Z87891 Personal history of nicotine dependence: Secondary | ICD-10-CM | POA: Diagnosis not present

## 2015-01-16 DIAGNOSIS — E119 Type 2 diabetes mellitus without complications: Secondary | ICD-10-CM | POA: Insufficient documentation

## 2015-01-16 DIAGNOSIS — R918 Other nonspecific abnormal finding of lung field: Secondary | ICD-10-CM | POA: Diagnosis not present

## 2015-01-16 DIAGNOSIS — I120 Hypertensive chronic kidney disease with stage 5 chronic kidney disease or end stage renal disease: Secondary | ICD-10-CM | POA: Diagnosis not present

## 2015-01-16 DIAGNOSIS — I70262 Atherosclerosis of native arteries of extremities with gangrene, left leg: Secondary | ICD-10-CM | POA: Diagnosis not present

## 2015-01-16 DIAGNOSIS — E1122 Type 2 diabetes mellitus with diabetic chronic kidney disease: Secondary | ICD-10-CM | POA: Diagnosis not present

## 2015-01-16 DIAGNOSIS — E1140 Type 2 diabetes mellitus with diabetic neuropathy, unspecified: Secondary | ICD-10-CM | POA: Diagnosis not present

## 2015-01-16 DIAGNOSIS — Z681 Body mass index (BMI) 19 or less, adult: Secondary | ICD-10-CM | POA: Diagnosis not present

## 2015-01-16 DIAGNOSIS — L97521 Non-pressure chronic ulcer of other part of left foot limited to breakdown of skin: Secondary | ICD-10-CM | POA: Diagnosis not present

## 2015-01-16 DIAGNOSIS — L97512 Non-pressure chronic ulcer of other part of right foot with fat layer exposed: Secondary | ICD-10-CM | POA: Diagnosis not present

## 2015-01-17 DIAGNOSIS — Z992 Dependence on renal dialysis: Secondary | ICD-10-CM | POA: Diagnosis not present

## 2015-01-17 DIAGNOSIS — E1129 Type 2 diabetes mellitus with other diabetic kidney complication: Secondary | ICD-10-CM | POA: Diagnosis not present

## 2015-01-17 DIAGNOSIS — S91309A Unspecified open wound, unspecified foot, initial encounter: Secondary | ICD-10-CM | POA: Diagnosis not present

## 2015-01-17 DIAGNOSIS — D631 Anemia in chronic kidney disease: Secondary | ICD-10-CM | POA: Diagnosis not present

## 2015-01-17 DIAGNOSIS — D509 Iron deficiency anemia, unspecified: Secondary | ICD-10-CM | POA: Diagnosis not present

## 2015-01-17 DIAGNOSIS — N2581 Secondary hyperparathyroidism of renal origin: Secondary | ICD-10-CM | POA: Diagnosis not present

## 2015-01-17 DIAGNOSIS — N186 End stage renal disease: Secondary | ICD-10-CM | POA: Diagnosis not present

## 2015-01-19 DIAGNOSIS — N2581 Secondary hyperparathyroidism of renal origin: Secondary | ICD-10-CM | POA: Diagnosis not present

## 2015-01-19 DIAGNOSIS — D631 Anemia in chronic kidney disease: Secondary | ICD-10-CM | POA: Diagnosis not present

## 2015-01-19 DIAGNOSIS — N186 End stage renal disease: Secondary | ICD-10-CM | POA: Diagnosis not present

## 2015-01-19 DIAGNOSIS — D509 Iron deficiency anemia, unspecified: Secondary | ICD-10-CM | POA: Diagnosis not present

## 2015-01-19 DIAGNOSIS — E1129 Type 2 diabetes mellitus with other diabetic kidney complication: Secondary | ICD-10-CM | POA: Diagnosis not present

## 2015-01-22 ENCOUNTER — Encounter (HOSPITAL_BASED_OUTPATIENT_CLINIC_OR_DEPARTMENT_OTHER): Payer: Medicare Other | Attending: Surgery

## 2015-01-22 DIAGNOSIS — I70235 Atherosclerosis of native arteries of right leg with ulceration of other part of foot: Secondary | ICD-10-CM | POA: Diagnosis not present

## 2015-01-22 DIAGNOSIS — D509 Iron deficiency anemia, unspecified: Secondary | ICD-10-CM | POA: Diagnosis not present

## 2015-01-22 DIAGNOSIS — E1140 Type 2 diabetes mellitus with diabetic neuropathy, unspecified: Secondary | ICD-10-CM | POA: Insufficient documentation

## 2015-01-22 DIAGNOSIS — N2581 Secondary hyperparathyroidism of renal origin: Secondary | ICD-10-CM | POA: Diagnosis not present

## 2015-01-22 DIAGNOSIS — I70262 Atherosclerosis of native arteries of extremities with gangrene, left leg: Secondary | ICD-10-CM | POA: Insufficient documentation

## 2015-01-22 DIAGNOSIS — N186 End stage renal disease: Secondary | ICD-10-CM | POA: Diagnosis not present

## 2015-01-22 DIAGNOSIS — E1152 Type 2 diabetes mellitus with diabetic peripheral angiopathy with gangrene: Secondary | ICD-10-CM | POA: Insufficient documentation

## 2015-01-22 DIAGNOSIS — D649 Anemia, unspecified: Secondary | ICD-10-CM | POA: Diagnosis not present

## 2015-01-22 DIAGNOSIS — L97521 Non-pressure chronic ulcer of other part of left foot limited to breakdown of skin: Secondary | ICD-10-CM | POA: Insufficient documentation

## 2015-01-22 DIAGNOSIS — E1122 Type 2 diabetes mellitus with diabetic chronic kidney disease: Secondary | ICD-10-CM | POA: Diagnosis not present

## 2015-01-22 DIAGNOSIS — L97511 Non-pressure chronic ulcer of other part of right foot limited to breakdown of skin: Secondary | ICD-10-CM | POA: Insufficient documentation

## 2015-01-22 DIAGNOSIS — E1129 Type 2 diabetes mellitus with other diabetic kidney complication: Secondary | ICD-10-CM | POA: Diagnosis not present

## 2015-01-22 DIAGNOSIS — Z992 Dependence on renal dialysis: Secondary | ICD-10-CM | POA: Insufficient documentation

## 2015-01-22 DIAGNOSIS — D631 Anemia in chronic kidney disease: Secondary | ICD-10-CM | POA: Diagnosis not present

## 2015-01-22 DIAGNOSIS — I120 Hypertensive chronic kidney disease with stage 5 chronic kidney disease or end stage renal disease: Secondary | ICD-10-CM | POA: Insufficient documentation

## 2015-01-22 DIAGNOSIS — E11621 Type 2 diabetes mellitus with foot ulcer: Secondary | ICD-10-CM | POA: Insufficient documentation

## 2015-01-23 DIAGNOSIS — I120 Hypertensive chronic kidney disease with stage 5 chronic kidney disease or end stage renal disease: Secondary | ICD-10-CM | POA: Diagnosis not present

## 2015-01-23 DIAGNOSIS — L97521 Non-pressure chronic ulcer of other part of left foot limited to breakdown of skin: Secondary | ICD-10-CM | POA: Diagnosis not present

## 2015-01-23 DIAGNOSIS — E11621 Type 2 diabetes mellitus with foot ulcer: Secondary | ICD-10-CM | POA: Diagnosis not present

## 2015-01-23 DIAGNOSIS — I70262 Atherosclerosis of native arteries of extremities with gangrene, left leg: Secondary | ICD-10-CM | POA: Diagnosis not present

## 2015-01-23 DIAGNOSIS — E1151 Type 2 diabetes mellitus with diabetic peripheral angiopathy without gangrene: Secondary | ICD-10-CM | POA: Diagnosis not present

## 2015-01-23 DIAGNOSIS — L97511 Non-pressure chronic ulcer of other part of right foot limited to breakdown of skin: Secondary | ICD-10-CM | POA: Diagnosis not present

## 2015-01-23 DIAGNOSIS — N186 End stage renal disease: Secondary | ICD-10-CM | POA: Diagnosis not present

## 2015-01-23 DIAGNOSIS — E1152 Type 2 diabetes mellitus with diabetic peripheral angiopathy with gangrene: Secondary | ICD-10-CM | POA: Diagnosis not present

## 2015-01-23 LAB — GLUCOSE, CAPILLARY
GLUCOSE-CAPILLARY: 125 mg/dL — AB (ref 65–99)
Glucose-Capillary: 178 mg/dL — ABNORMAL HIGH (ref 65–99)

## 2015-01-24 DIAGNOSIS — D509 Iron deficiency anemia, unspecified: Secondary | ICD-10-CM | POA: Diagnosis not present

## 2015-01-24 DIAGNOSIS — N2581 Secondary hyperparathyroidism of renal origin: Secondary | ICD-10-CM | POA: Diagnosis not present

## 2015-01-24 DIAGNOSIS — E1152 Type 2 diabetes mellitus with diabetic peripheral angiopathy with gangrene: Secondary | ICD-10-CM | POA: Diagnosis not present

## 2015-01-24 DIAGNOSIS — L97511 Non-pressure chronic ulcer of other part of right foot limited to breakdown of skin: Secondary | ICD-10-CM | POA: Diagnosis not present

## 2015-01-24 DIAGNOSIS — E11621 Type 2 diabetes mellitus with foot ulcer: Secondary | ICD-10-CM | POA: Diagnosis not present

## 2015-01-24 DIAGNOSIS — N186 End stage renal disease: Secondary | ICD-10-CM | POA: Diagnosis not present

## 2015-01-24 DIAGNOSIS — L97521 Non-pressure chronic ulcer of other part of left foot limited to breakdown of skin: Secondary | ICD-10-CM | POA: Diagnosis not present

## 2015-01-24 DIAGNOSIS — I120 Hypertensive chronic kidney disease with stage 5 chronic kidney disease or end stage renal disease: Secondary | ICD-10-CM | POA: Diagnosis not present

## 2015-01-24 DIAGNOSIS — E1129 Type 2 diabetes mellitus with other diabetic kidney complication: Secondary | ICD-10-CM | POA: Diagnosis not present

## 2015-01-24 DIAGNOSIS — D631 Anemia in chronic kidney disease: Secondary | ICD-10-CM | POA: Diagnosis not present

## 2015-01-24 LAB — GLUCOSE, CAPILLARY
Glucose-Capillary: 119 mg/dL — ABNORMAL HIGH (ref 65–99)
Glucose-Capillary: 110 mg/dL — ABNORMAL HIGH (ref 65–99)

## 2015-01-25 DIAGNOSIS — N186 End stage renal disease: Secondary | ICD-10-CM | POA: Diagnosis not present

## 2015-01-25 DIAGNOSIS — E1152 Type 2 diabetes mellitus with diabetic peripheral angiopathy with gangrene: Secondary | ICD-10-CM | POA: Diagnosis not present

## 2015-01-25 DIAGNOSIS — L97521 Non-pressure chronic ulcer of other part of left foot limited to breakdown of skin: Secondary | ICD-10-CM | POA: Diagnosis not present

## 2015-01-25 DIAGNOSIS — E11621 Type 2 diabetes mellitus with foot ulcer: Secondary | ICD-10-CM | POA: Diagnosis not present

## 2015-01-25 DIAGNOSIS — L97511 Non-pressure chronic ulcer of other part of right foot limited to breakdown of skin: Secondary | ICD-10-CM | POA: Diagnosis not present

## 2015-01-25 DIAGNOSIS — I120 Hypertensive chronic kidney disease with stage 5 chronic kidney disease or end stage renal disease: Secondary | ICD-10-CM | POA: Diagnosis not present

## 2015-01-25 LAB — GLUCOSE, CAPILLARY
GLUCOSE-CAPILLARY: 137 mg/dL — AB (ref 65–99)
Glucose-Capillary: 222 mg/dL — ABNORMAL HIGH (ref 65–99)

## 2015-01-26 DIAGNOSIS — N186 End stage renal disease: Secondary | ICD-10-CM | POA: Diagnosis not present

## 2015-01-26 DIAGNOSIS — N2581 Secondary hyperparathyroidism of renal origin: Secondary | ICD-10-CM | POA: Diagnosis not present

## 2015-01-26 DIAGNOSIS — E1129 Type 2 diabetes mellitus with other diabetic kidney complication: Secondary | ICD-10-CM | POA: Diagnosis not present

## 2015-01-26 DIAGNOSIS — D631 Anemia in chronic kidney disease: Secondary | ICD-10-CM | POA: Diagnosis not present

## 2015-01-26 DIAGNOSIS — D509 Iron deficiency anemia, unspecified: Secondary | ICD-10-CM | POA: Diagnosis not present

## 2015-01-28 DIAGNOSIS — L97521 Non-pressure chronic ulcer of other part of left foot limited to breakdown of skin: Secondary | ICD-10-CM | POA: Diagnosis not present

## 2015-01-28 DIAGNOSIS — N186 End stage renal disease: Secondary | ICD-10-CM | POA: Diagnosis not present

## 2015-01-28 DIAGNOSIS — I120 Hypertensive chronic kidney disease with stage 5 chronic kidney disease or end stage renal disease: Secondary | ICD-10-CM | POA: Diagnosis not present

## 2015-01-28 DIAGNOSIS — E1152 Type 2 diabetes mellitus with diabetic peripheral angiopathy with gangrene: Secondary | ICD-10-CM | POA: Diagnosis not present

## 2015-01-28 DIAGNOSIS — L97511 Non-pressure chronic ulcer of other part of right foot limited to breakdown of skin: Secondary | ICD-10-CM | POA: Diagnosis not present

## 2015-01-28 DIAGNOSIS — E11621 Type 2 diabetes mellitus with foot ulcer: Secondary | ICD-10-CM | POA: Diagnosis not present

## 2015-01-28 LAB — GLUCOSE, CAPILLARY
GLUCOSE-CAPILLARY: 120 mg/dL — AB (ref 65–99)
Glucose-Capillary: 185 mg/dL — ABNORMAL HIGH (ref 65–99)

## 2015-01-29 DIAGNOSIS — L97521 Non-pressure chronic ulcer of other part of left foot limited to breakdown of skin: Secondary | ICD-10-CM | POA: Diagnosis not present

## 2015-01-29 DIAGNOSIS — N2581 Secondary hyperparathyroidism of renal origin: Secondary | ICD-10-CM | POA: Diagnosis not present

## 2015-01-29 DIAGNOSIS — E11621 Type 2 diabetes mellitus with foot ulcer: Secondary | ICD-10-CM | POA: Diagnosis not present

## 2015-01-29 DIAGNOSIS — E1129 Type 2 diabetes mellitus with other diabetic kidney complication: Secondary | ICD-10-CM | POA: Diagnosis not present

## 2015-01-29 DIAGNOSIS — E1152 Type 2 diabetes mellitus with diabetic peripheral angiopathy with gangrene: Secondary | ICD-10-CM | POA: Diagnosis not present

## 2015-01-29 DIAGNOSIS — L97511 Non-pressure chronic ulcer of other part of right foot limited to breakdown of skin: Secondary | ICD-10-CM | POA: Diagnosis not present

## 2015-01-29 DIAGNOSIS — D631 Anemia in chronic kidney disease: Secondary | ICD-10-CM | POA: Diagnosis not present

## 2015-01-29 DIAGNOSIS — N186 End stage renal disease: Secondary | ICD-10-CM | POA: Diagnosis not present

## 2015-01-29 DIAGNOSIS — D509 Iron deficiency anemia, unspecified: Secondary | ICD-10-CM | POA: Diagnosis not present

## 2015-01-29 DIAGNOSIS — I120 Hypertensive chronic kidney disease with stage 5 chronic kidney disease or end stage renal disease: Secondary | ICD-10-CM | POA: Diagnosis not present

## 2015-01-29 LAB — GLUCOSE, CAPILLARY
Glucose-Capillary: 178 mg/dL — ABNORMAL HIGH (ref 65–99)
Glucose-Capillary: 96 mg/dL (ref 65–99)

## 2015-01-30 DIAGNOSIS — L97511 Non-pressure chronic ulcer of other part of right foot limited to breakdown of skin: Secondary | ICD-10-CM | POA: Diagnosis not present

## 2015-01-30 DIAGNOSIS — I120 Hypertensive chronic kidney disease with stage 5 chronic kidney disease or end stage renal disease: Secondary | ICD-10-CM | POA: Diagnosis not present

## 2015-01-30 DIAGNOSIS — N186 End stage renal disease: Secondary | ICD-10-CM | POA: Diagnosis not present

## 2015-01-30 DIAGNOSIS — E11621 Type 2 diabetes mellitus with foot ulcer: Secondary | ICD-10-CM | POA: Diagnosis not present

## 2015-01-30 DIAGNOSIS — E1152 Type 2 diabetes mellitus with diabetic peripheral angiopathy with gangrene: Secondary | ICD-10-CM | POA: Diagnosis not present

## 2015-01-30 DIAGNOSIS — I70262 Atherosclerosis of native arteries of extremities with gangrene, left leg: Secondary | ICD-10-CM | POA: Diagnosis not present

## 2015-01-30 DIAGNOSIS — I70235 Atherosclerosis of native arteries of right leg with ulceration of other part of foot: Secondary | ICD-10-CM | POA: Diagnosis not present

## 2015-01-30 DIAGNOSIS — L97521 Non-pressure chronic ulcer of other part of left foot limited to breakdown of skin: Secondary | ICD-10-CM | POA: Diagnosis not present

## 2015-01-31 ENCOUNTER — Encounter: Payer: Self-pay | Admitting: Surgery

## 2015-01-31 DIAGNOSIS — D509 Iron deficiency anemia, unspecified: Secondary | ICD-10-CM | POA: Diagnosis not present

## 2015-01-31 DIAGNOSIS — E1129 Type 2 diabetes mellitus with other diabetic kidney complication: Secondary | ICD-10-CM | POA: Diagnosis not present

## 2015-01-31 DIAGNOSIS — N186 End stage renal disease: Secondary | ICD-10-CM | POA: Diagnosis not present

## 2015-01-31 DIAGNOSIS — L97521 Non-pressure chronic ulcer of other part of left foot limited to breakdown of skin: Secondary | ICD-10-CM | POA: Diagnosis not present

## 2015-01-31 DIAGNOSIS — L97511 Non-pressure chronic ulcer of other part of right foot limited to breakdown of skin: Secondary | ICD-10-CM | POA: Diagnosis not present

## 2015-01-31 DIAGNOSIS — E1152 Type 2 diabetes mellitus with diabetic peripheral angiopathy with gangrene: Secondary | ICD-10-CM | POA: Diagnosis not present

## 2015-01-31 DIAGNOSIS — E11621 Type 2 diabetes mellitus with foot ulcer: Secondary | ICD-10-CM | POA: Diagnosis not present

## 2015-01-31 DIAGNOSIS — N2581 Secondary hyperparathyroidism of renal origin: Secondary | ICD-10-CM | POA: Diagnosis not present

## 2015-01-31 DIAGNOSIS — I120 Hypertensive chronic kidney disease with stage 5 chronic kidney disease or end stage renal disease: Secondary | ICD-10-CM | POA: Diagnosis not present

## 2015-01-31 DIAGNOSIS — D631 Anemia in chronic kidney disease: Secondary | ICD-10-CM | POA: Diagnosis not present

## 2015-01-31 LAB — GLUCOSE, CAPILLARY
GLUCOSE-CAPILLARY: 106 mg/dL — AB (ref 65–99)
Glucose-Capillary: 184 mg/dL — ABNORMAL HIGH (ref 65–99)
Glucose-Capillary: 107 mg/dL — ABNORMAL HIGH (ref 65–99)
Glucose-Capillary: 188 mg/dL — ABNORMAL HIGH (ref 65–99)

## 2015-02-01 DIAGNOSIS — L97511 Non-pressure chronic ulcer of other part of right foot limited to breakdown of skin: Secondary | ICD-10-CM | POA: Diagnosis not present

## 2015-02-01 DIAGNOSIS — N186 End stage renal disease: Secondary | ICD-10-CM | POA: Diagnosis not present

## 2015-02-01 DIAGNOSIS — L97521 Non-pressure chronic ulcer of other part of left foot limited to breakdown of skin: Secondary | ICD-10-CM | POA: Diagnosis not present

## 2015-02-01 DIAGNOSIS — E1152 Type 2 diabetes mellitus with diabetic peripheral angiopathy with gangrene: Secondary | ICD-10-CM | POA: Diagnosis not present

## 2015-02-01 DIAGNOSIS — E11621 Type 2 diabetes mellitus with foot ulcer: Secondary | ICD-10-CM | POA: Diagnosis not present

## 2015-02-01 DIAGNOSIS — I120 Hypertensive chronic kidney disease with stage 5 chronic kidney disease or end stage renal disease: Secondary | ICD-10-CM | POA: Diagnosis not present

## 2015-02-02 DIAGNOSIS — N186 End stage renal disease: Secondary | ICD-10-CM | POA: Diagnosis not present

## 2015-02-02 DIAGNOSIS — N2581 Secondary hyperparathyroidism of renal origin: Secondary | ICD-10-CM | POA: Diagnosis not present

## 2015-02-02 DIAGNOSIS — D509 Iron deficiency anemia, unspecified: Secondary | ICD-10-CM | POA: Diagnosis not present

## 2015-02-02 DIAGNOSIS — D631 Anemia in chronic kidney disease: Secondary | ICD-10-CM | POA: Diagnosis not present

## 2015-02-02 DIAGNOSIS — E1129 Type 2 diabetes mellitus with other diabetic kidney complication: Secondary | ICD-10-CM | POA: Diagnosis not present

## 2015-02-04 ENCOUNTER — Ambulatory Visit: Payer: Medicare Other | Admitting: Surgery

## 2015-02-04 DIAGNOSIS — L97521 Non-pressure chronic ulcer of other part of left foot limited to breakdown of skin: Secondary | ICD-10-CM | POA: Diagnosis not present

## 2015-02-04 DIAGNOSIS — D649 Anemia, unspecified: Secondary | ICD-10-CM | POA: Diagnosis not present

## 2015-02-04 DIAGNOSIS — N186 End stage renal disease: Secondary | ICD-10-CM | POA: Diagnosis not present

## 2015-02-04 DIAGNOSIS — I120 Hypertensive chronic kidney disease with stage 5 chronic kidney disease or end stage renal disease: Secondary | ICD-10-CM | POA: Diagnosis not present

## 2015-02-04 DIAGNOSIS — E11621 Type 2 diabetes mellitus with foot ulcer: Secondary | ICD-10-CM | POA: Diagnosis not present

## 2015-02-04 DIAGNOSIS — E1152 Type 2 diabetes mellitus with diabetic peripheral angiopathy with gangrene: Secondary | ICD-10-CM | POA: Diagnosis not present

## 2015-02-04 DIAGNOSIS — L97412 Non-pressure chronic ulcer of right heel and midfoot with fat layer exposed: Secondary | ICD-10-CM | POA: Diagnosis not present

## 2015-02-04 DIAGNOSIS — L97511 Non-pressure chronic ulcer of other part of right foot limited to breakdown of skin: Secondary | ICD-10-CM | POA: Diagnosis not present

## 2015-02-04 DIAGNOSIS — L97422 Non-pressure chronic ulcer of left heel and midfoot with fat layer exposed: Secondary | ICD-10-CM | POA: Diagnosis not present

## 2015-02-04 LAB — GLUCOSE, CAPILLARY
Glucose-Capillary: 172 mg/dL — ABNORMAL HIGH (ref 65–99)
Glucose-Capillary: 91 mg/dL (ref 65–99)
Glucose-Capillary: 184 mg/dL — ABNORMAL HIGH (ref 65–99)
Glucose-Capillary: 107 mg/dL — ABNORMAL HIGH (ref 65–99)

## 2015-02-05 DIAGNOSIS — D509 Iron deficiency anemia, unspecified: Secondary | ICD-10-CM | POA: Diagnosis not present

## 2015-02-05 DIAGNOSIS — E11621 Type 2 diabetes mellitus with foot ulcer: Secondary | ICD-10-CM | POA: Diagnosis not present

## 2015-02-05 DIAGNOSIS — L97511 Non-pressure chronic ulcer of other part of right foot limited to breakdown of skin: Secondary | ICD-10-CM | POA: Diagnosis not present

## 2015-02-05 DIAGNOSIS — E1129 Type 2 diabetes mellitus with other diabetic kidney complication: Secondary | ICD-10-CM | POA: Diagnosis not present

## 2015-02-05 DIAGNOSIS — E1152 Type 2 diabetes mellitus with diabetic peripheral angiopathy with gangrene: Secondary | ICD-10-CM | POA: Diagnosis not present

## 2015-02-05 DIAGNOSIS — N2581 Secondary hyperparathyroidism of renal origin: Secondary | ICD-10-CM | POA: Diagnosis not present

## 2015-02-05 DIAGNOSIS — D631 Anemia in chronic kidney disease: Secondary | ICD-10-CM | POA: Diagnosis not present

## 2015-02-05 DIAGNOSIS — L97521 Non-pressure chronic ulcer of other part of left foot limited to breakdown of skin: Secondary | ICD-10-CM | POA: Diagnosis not present

## 2015-02-05 DIAGNOSIS — I120 Hypertensive chronic kidney disease with stage 5 chronic kidney disease or end stage renal disease: Secondary | ICD-10-CM | POA: Diagnosis not present

## 2015-02-05 DIAGNOSIS — N186 End stage renal disease: Secondary | ICD-10-CM | POA: Diagnosis not present

## 2015-02-05 LAB — GLUCOSE, CAPILLARY
GLUCOSE-CAPILLARY: 130 mg/dL — AB (ref 65–99)
GLUCOSE-CAPILLARY: 157 mg/dL — AB (ref 65–99)

## 2015-02-06 DIAGNOSIS — E1152 Type 2 diabetes mellitus with diabetic peripheral angiopathy with gangrene: Secondary | ICD-10-CM | POA: Diagnosis not present

## 2015-02-06 DIAGNOSIS — E11621 Type 2 diabetes mellitus with foot ulcer: Secondary | ICD-10-CM | POA: Diagnosis not present

## 2015-02-06 DIAGNOSIS — I120 Hypertensive chronic kidney disease with stage 5 chronic kidney disease or end stage renal disease: Secondary | ICD-10-CM | POA: Diagnosis not present

## 2015-02-06 DIAGNOSIS — N186 End stage renal disease: Secondary | ICD-10-CM | POA: Diagnosis not present

## 2015-02-06 DIAGNOSIS — L97521 Non-pressure chronic ulcer of other part of left foot limited to breakdown of skin: Secondary | ICD-10-CM | POA: Diagnosis not present

## 2015-02-06 DIAGNOSIS — I70235 Atherosclerosis of native arteries of right leg with ulceration of other part of foot: Secondary | ICD-10-CM | POA: Diagnosis not present

## 2015-02-06 DIAGNOSIS — I70262 Atherosclerosis of native arteries of extremities with gangrene, left leg: Secondary | ICD-10-CM | POA: Diagnosis not present

## 2015-02-06 DIAGNOSIS — L97511 Non-pressure chronic ulcer of other part of right foot limited to breakdown of skin: Secondary | ICD-10-CM | POA: Diagnosis not present

## 2015-02-06 LAB — GLUCOSE, CAPILLARY
GLUCOSE-CAPILLARY: 129 mg/dL — AB (ref 65–99)
Glucose-Capillary: 251 mg/dL — ABNORMAL HIGH (ref 65–99)

## 2015-02-07 DIAGNOSIS — N2581 Secondary hyperparathyroidism of renal origin: Secondary | ICD-10-CM | POA: Diagnosis not present

## 2015-02-07 DIAGNOSIS — D509 Iron deficiency anemia, unspecified: Secondary | ICD-10-CM | POA: Diagnosis not present

## 2015-02-07 DIAGNOSIS — L97511 Non-pressure chronic ulcer of other part of right foot limited to breakdown of skin: Secondary | ICD-10-CM | POA: Diagnosis not present

## 2015-02-07 DIAGNOSIS — D631 Anemia in chronic kidney disease: Secondary | ICD-10-CM | POA: Diagnosis not present

## 2015-02-07 DIAGNOSIS — I120 Hypertensive chronic kidney disease with stage 5 chronic kidney disease or end stage renal disease: Secondary | ICD-10-CM | POA: Diagnosis not present

## 2015-02-07 DIAGNOSIS — N186 End stage renal disease: Secondary | ICD-10-CM | POA: Diagnosis not present

## 2015-02-07 DIAGNOSIS — E1129 Type 2 diabetes mellitus with other diabetic kidney complication: Secondary | ICD-10-CM | POA: Diagnosis not present

## 2015-02-07 DIAGNOSIS — E11621 Type 2 diabetes mellitus with foot ulcer: Secondary | ICD-10-CM | POA: Diagnosis not present

## 2015-02-07 DIAGNOSIS — L97521 Non-pressure chronic ulcer of other part of left foot limited to breakdown of skin: Secondary | ICD-10-CM | POA: Diagnosis not present

## 2015-02-07 DIAGNOSIS — E1152 Type 2 diabetes mellitus with diabetic peripheral angiopathy with gangrene: Secondary | ICD-10-CM | POA: Diagnosis not present

## 2015-02-07 LAB — GLUCOSE, CAPILLARY
GLUCOSE-CAPILLARY: 180 mg/dL — AB (ref 65–99)
GLUCOSE-CAPILLARY: 112 mg/dL — AB (ref 65–99)

## 2015-02-08 DIAGNOSIS — Z9181 History of falling: Secondary | ICD-10-CM | POA: Diagnosis not present

## 2015-02-08 DIAGNOSIS — E1122 Type 2 diabetes mellitus with diabetic chronic kidney disease: Secondary | ICD-10-CM | POA: Diagnosis not present

## 2015-02-08 DIAGNOSIS — L97521 Non-pressure chronic ulcer of other part of left foot limited to breakdown of skin: Secondary | ICD-10-CM | POA: Diagnosis not present

## 2015-02-08 DIAGNOSIS — Z4800 Encounter for change or removal of nonsurgical wound dressing: Secondary | ICD-10-CM | POA: Diagnosis not present

## 2015-02-08 DIAGNOSIS — Z992 Dependence on renal dialysis: Secondary | ICD-10-CM | POA: Diagnosis not present

## 2015-02-08 DIAGNOSIS — L97511 Non-pressure chronic ulcer of other part of right foot limited to breakdown of skin: Secondary | ICD-10-CM | POA: Diagnosis not present

## 2015-02-08 DIAGNOSIS — Z794 Long term (current) use of insulin: Secondary | ICD-10-CM | POA: Diagnosis not present

## 2015-02-08 DIAGNOSIS — Z792 Long term (current) use of antibiotics: Secondary | ICD-10-CM | POA: Diagnosis not present

## 2015-02-08 DIAGNOSIS — I120 Hypertensive chronic kidney disease with stage 5 chronic kidney disease or end stage renal disease: Secondary | ICD-10-CM | POA: Diagnosis not present

## 2015-02-08 DIAGNOSIS — L97412 Non-pressure chronic ulcer of right heel and midfoot with fat layer exposed: Secondary | ICD-10-CM | POA: Diagnosis not present

## 2015-02-08 DIAGNOSIS — N186 End stage renal disease: Secondary | ICD-10-CM | POA: Diagnosis not present

## 2015-02-08 DIAGNOSIS — M1990 Unspecified osteoarthritis, unspecified site: Secondary | ICD-10-CM | POA: Diagnosis not present

## 2015-02-08 DIAGNOSIS — E11621 Type 2 diabetes mellitus with foot ulcer: Secondary | ICD-10-CM | POA: Diagnosis not present

## 2015-02-08 DIAGNOSIS — D649 Anemia, unspecified: Secondary | ICD-10-CM | POA: Diagnosis not present

## 2015-02-08 DIAGNOSIS — E1152 Type 2 diabetes mellitus with diabetic peripheral angiopathy with gangrene: Secondary | ICD-10-CM | POA: Diagnosis not present

## 2015-02-08 DIAGNOSIS — L97422 Non-pressure chronic ulcer of left heel and midfoot with fat layer exposed: Secondary | ICD-10-CM | POA: Diagnosis not present

## 2015-02-08 LAB — GLUCOSE, CAPILLARY
GLUCOSE-CAPILLARY: 223 mg/dL — AB (ref 65–99)
Glucose-Capillary: 108 mg/dL — ABNORMAL HIGH (ref 65–99)
Glucose-Capillary: 104 mg/dL — ABNORMAL HIGH (ref 65–99)
Glucose-Capillary: 135 mg/dL — ABNORMAL HIGH (ref 65–99)

## 2015-02-09 DIAGNOSIS — N2581 Secondary hyperparathyroidism of renal origin: Secondary | ICD-10-CM | POA: Diagnosis not present

## 2015-02-09 DIAGNOSIS — E11621 Type 2 diabetes mellitus with foot ulcer: Secondary | ICD-10-CM | POA: Diagnosis not present

## 2015-02-09 DIAGNOSIS — D631 Anemia in chronic kidney disease: Secondary | ICD-10-CM | POA: Diagnosis not present

## 2015-02-09 DIAGNOSIS — E1122 Type 2 diabetes mellitus with diabetic chronic kidney disease: Secondary | ICD-10-CM | POA: Diagnosis not present

## 2015-02-09 DIAGNOSIS — E1129 Type 2 diabetes mellitus with other diabetic kidney complication: Secondary | ICD-10-CM | POA: Diagnosis not present

## 2015-02-09 DIAGNOSIS — N186 End stage renal disease: Secondary | ICD-10-CM | POA: Diagnosis not present

## 2015-02-09 DIAGNOSIS — L97412 Non-pressure chronic ulcer of right heel and midfoot with fat layer exposed: Secondary | ICD-10-CM | POA: Diagnosis not present

## 2015-02-09 DIAGNOSIS — I120 Hypertensive chronic kidney disease with stage 5 chronic kidney disease or end stage renal disease: Secondary | ICD-10-CM | POA: Diagnosis not present

## 2015-02-09 DIAGNOSIS — L97422 Non-pressure chronic ulcer of left heel and midfoot with fat layer exposed: Secondary | ICD-10-CM | POA: Diagnosis not present

## 2015-02-09 DIAGNOSIS — D509 Iron deficiency anemia, unspecified: Secondary | ICD-10-CM | POA: Diagnosis not present

## 2015-02-11 DIAGNOSIS — L97412 Non-pressure chronic ulcer of right heel and midfoot with fat layer exposed: Secondary | ICD-10-CM | POA: Diagnosis not present

## 2015-02-11 DIAGNOSIS — I120 Hypertensive chronic kidney disease with stage 5 chronic kidney disease or end stage renal disease: Secondary | ICD-10-CM | POA: Diagnosis not present

## 2015-02-11 DIAGNOSIS — L97521 Non-pressure chronic ulcer of other part of left foot limited to breakdown of skin: Secondary | ICD-10-CM | POA: Diagnosis not present

## 2015-02-11 DIAGNOSIS — L97511 Non-pressure chronic ulcer of other part of right foot limited to breakdown of skin: Secondary | ICD-10-CM | POA: Diagnosis not present

## 2015-02-11 DIAGNOSIS — E11621 Type 2 diabetes mellitus with foot ulcer: Secondary | ICD-10-CM | POA: Diagnosis not present

## 2015-02-11 DIAGNOSIS — N186 End stage renal disease: Secondary | ICD-10-CM | POA: Diagnosis not present

## 2015-02-11 DIAGNOSIS — E1122 Type 2 diabetes mellitus with diabetic chronic kidney disease: Secondary | ICD-10-CM | POA: Diagnosis not present

## 2015-02-11 DIAGNOSIS — E1152 Type 2 diabetes mellitus with diabetic peripheral angiopathy with gangrene: Secondary | ICD-10-CM | POA: Diagnosis not present

## 2015-02-11 DIAGNOSIS — L97422 Non-pressure chronic ulcer of left heel and midfoot with fat layer exposed: Secondary | ICD-10-CM | POA: Diagnosis not present

## 2015-02-11 LAB — GLUCOSE, CAPILLARY
GLUCOSE-CAPILLARY: 100 mg/dL — AB (ref 65–99)
Glucose-Capillary: 166 mg/dL — ABNORMAL HIGH (ref 65–99)

## 2015-02-12 DIAGNOSIS — E1129 Type 2 diabetes mellitus with other diabetic kidney complication: Secondary | ICD-10-CM | POA: Diagnosis not present

## 2015-02-12 DIAGNOSIS — D509 Iron deficiency anemia, unspecified: Secondary | ICD-10-CM | POA: Diagnosis not present

## 2015-02-12 DIAGNOSIS — L97511 Non-pressure chronic ulcer of other part of right foot limited to breakdown of skin: Secondary | ICD-10-CM | POA: Diagnosis not present

## 2015-02-12 DIAGNOSIS — L97521 Non-pressure chronic ulcer of other part of left foot limited to breakdown of skin: Secondary | ICD-10-CM | POA: Diagnosis not present

## 2015-02-12 DIAGNOSIS — I120 Hypertensive chronic kidney disease with stage 5 chronic kidney disease or end stage renal disease: Secondary | ICD-10-CM | POA: Diagnosis not present

## 2015-02-12 DIAGNOSIS — N186 End stage renal disease: Secondary | ICD-10-CM | POA: Diagnosis not present

## 2015-02-12 DIAGNOSIS — N2581 Secondary hyperparathyroidism of renal origin: Secondary | ICD-10-CM | POA: Diagnosis not present

## 2015-02-12 DIAGNOSIS — E1152 Type 2 diabetes mellitus with diabetic peripheral angiopathy with gangrene: Secondary | ICD-10-CM | POA: Diagnosis not present

## 2015-02-12 DIAGNOSIS — E11621 Type 2 diabetes mellitus with foot ulcer: Secondary | ICD-10-CM | POA: Diagnosis not present

## 2015-02-12 DIAGNOSIS — D631 Anemia in chronic kidney disease: Secondary | ICD-10-CM | POA: Diagnosis not present

## 2015-02-12 LAB — GLUCOSE, CAPILLARY
Glucose-Capillary: 120 mg/dL — ABNORMAL HIGH (ref 65–99)
Glucose-Capillary: 128 mg/dL — ABNORMAL HIGH (ref 65–99)
Glucose-Capillary: 152 mg/dL — ABNORMAL HIGH (ref 65–99)
Glucose-Capillary: 281 mg/dL — ABNORMAL HIGH (ref 65–99)

## 2015-02-13 DIAGNOSIS — L97512 Non-pressure chronic ulcer of other part of right foot with fat layer exposed: Secondary | ICD-10-CM | POA: Diagnosis not present

## 2015-02-13 DIAGNOSIS — N186 End stage renal disease: Secondary | ICD-10-CM | POA: Diagnosis not present

## 2015-02-13 DIAGNOSIS — L97511 Non-pressure chronic ulcer of other part of right foot limited to breakdown of skin: Secondary | ICD-10-CM | POA: Diagnosis not present

## 2015-02-13 DIAGNOSIS — I70262 Atherosclerosis of native arteries of extremities with gangrene, left leg: Secondary | ICD-10-CM | POA: Diagnosis not present

## 2015-02-13 DIAGNOSIS — L97521 Non-pressure chronic ulcer of other part of left foot limited to breakdown of skin: Secondary | ICD-10-CM | POA: Diagnosis not present

## 2015-02-13 DIAGNOSIS — E11621 Type 2 diabetes mellitus with foot ulcer: Secondary | ICD-10-CM | POA: Diagnosis not present

## 2015-02-13 DIAGNOSIS — I120 Hypertensive chronic kidney disease with stage 5 chronic kidney disease or end stage renal disease: Secondary | ICD-10-CM | POA: Diagnosis not present

## 2015-02-13 DIAGNOSIS — E1152 Type 2 diabetes mellitus with diabetic peripheral angiopathy with gangrene: Secondary | ICD-10-CM | POA: Diagnosis not present

## 2015-02-14 DIAGNOSIS — N2581 Secondary hyperparathyroidism of renal origin: Secondary | ICD-10-CM | POA: Diagnosis not present

## 2015-02-14 DIAGNOSIS — D631 Anemia in chronic kidney disease: Secondary | ICD-10-CM | POA: Diagnosis not present

## 2015-02-14 DIAGNOSIS — D509 Iron deficiency anemia, unspecified: Secondary | ICD-10-CM | POA: Diagnosis not present

## 2015-02-14 DIAGNOSIS — E1129 Type 2 diabetes mellitus with other diabetic kidney complication: Secondary | ICD-10-CM | POA: Diagnosis not present

## 2015-02-14 DIAGNOSIS — N186 End stage renal disease: Secondary | ICD-10-CM | POA: Diagnosis not present

## 2015-02-15 DIAGNOSIS — I120 Hypertensive chronic kidney disease with stage 5 chronic kidney disease or end stage renal disease: Secondary | ICD-10-CM | POA: Diagnosis not present

## 2015-02-15 DIAGNOSIS — L97412 Non-pressure chronic ulcer of right heel and midfoot with fat layer exposed: Secondary | ICD-10-CM | POA: Diagnosis not present

## 2015-02-15 DIAGNOSIS — E11621 Type 2 diabetes mellitus with foot ulcer: Secondary | ICD-10-CM | POA: Diagnosis not present

## 2015-02-15 DIAGNOSIS — N186 End stage renal disease: Secondary | ICD-10-CM | POA: Diagnosis not present

## 2015-02-15 DIAGNOSIS — L97422 Non-pressure chronic ulcer of left heel and midfoot with fat layer exposed: Secondary | ICD-10-CM | POA: Diagnosis not present

## 2015-02-15 DIAGNOSIS — E1122 Type 2 diabetes mellitus with diabetic chronic kidney disease: Secondary | ICD-10-CM | POA: Diagnosis not present

## 2015-02-15 DIAGNOSIS — E1152 Type 2 diabetes mellitus with diabetic peripheral angiopathy with gangrene: Secondary | ICD-10-CM | POA: Diagnosis not present

## 2015-02-15 DIAGNOSIS — L97521 Non-pressure chronic ulcer of other part of left foot limited to breakdown of skin: Secondary | ICD-10-CM | POA: Diagnosis not present

## 2015-02-15 DIAGNOSIS — L97511 Non-pressure chronic ulcer of other part of right foot limited to breakdown of skin: Secondary | ICD-10-CM | POA: Diagnosis not present

## 2015-02-15 LAB — GLUCOSE, CAPILLARY
GLUCOSE-CAPILLARY: 161 mg/dL — AB (ref 65–99)
GLUCOSE-CAPILLARY: 93 mg/dL (ref 65–99)

## 2015-02-16 DIAGNOSIS — N2581 Secondary hyperparathyroidism of renal origin: Secondary | ICD-10-CM | POA: Diagnosis not present

## 2015-02-16 DIAGNOSIS — E1129 Type 2 diabetes mellitus with other diabetic kidney complication: Secondary | ICD-10-CM | POA: Diagnosis not present

## 2015-02-16 DIAGNOSIS — N186 End stage renal disease: Secondary | ICD-10-CM | POA: Diagnosis not present

## 2015-02-16 DIAGNOSIS — D631 Anemia in chronic kidney disease: Secondary | ICD-10-CM | POA: Diagnosis not present

## 2015-02-16 DIAGNOSIS — D509 Iron deficiency anemia, unspecified: Secondary | ICD-10-CM | POA: Diagnosis not present

## 2015-02-17 DIAGNOSIS — Z992 Dependence on renal dialysis: Secondary | ICD-10-CM | POA: Diagnosis not present

## 2015-02-17 DIAGNOSIS — N186 End stage renal disease: Secondary | ICD-10-CM | POA: Diagnosis not present

## 2015-02-17 DIAGNOSIS — E1129 Type 2 diabetes mellitus with other diabetic kidney complication: Secondary | ICD-10-CM | POA: Diagnosis not present

## 2015-02-18 ENCOUNTER — Encounter (HOSPITAL_BASED_OUTPATIENT_CLINIC_OR_DEPARTMENT_OTHER): Payer: Medicare Other | Attending: Surgery

## 2015-02-18 DIAGNOSIS — N186 End stage renal disease: Secondary | ICD-10-CM | POA: Insufficient documentation

## 2015-02-18 DIAGNOSIS — E1140 Type 2 diabetes mellitus with diabetic neuropathy, unspecified: Secondary | ICD-10-CM | POA: Insufficient documentation

## 2015-02-18 DIAGNOSIS — E11621 Type 2 diabetes mellitus with foot ulcer: Secondary | ICD-10-CM | POA: Insufficient documentation

## 2015-02-18 DIAGNOSIS — I70262 Atherosclerosis of native arteries of extremities with gangrene, left leg: Secondary | ICD-10-CM | POA: Insufficient documentation

## 2015-02-18 DIAGNOSIS — L98492 Non-pressure chronic ulcer of skin of other sites with fat layer exposed: Secondary | ICD-10-CM | POA: Insufficient documentation

## 2015-02-18 DIAGNOSIS — L97422 Non-pressure chronic ulcer of left heel and midfoot with fat layer exposed: Secondary | ICD-10-CM | POA: Diagnosis not present

## 2015-02-18 DIAGNOSIS — Z992 Dependence on renal dialysis: Secondary | ICD-10-CM | POA: Diagnosis not present

## 2015-02-18 DIAGNOSIS — E1136 Type 2 diabetes mellitus with diabetic cataract: Secondary | ICD-10-CM | POA: Diagnosis not present

## 2015-02-18 DIAGNOSIS — I120 Hypertensive chronic kidney disease with stage 5 chronic kidney disease or end stage renal disease: Secondary | ICD-10-CM | POA: Diagnosis not present

## 2015-02-18 DIAGNOSIS — E1152 Type 2 diabetes mellitus with diabetic peripheral angiopathy with gangrene: Secondary | ICD-10-CM | POA: Diagnosis not present

## 2015-02-18 DIAGNOSIS — I70245 Atherosclerosis of native arteries of left leg with ulceration of other part of foot: Secondary | ICD-10-CM | POA: Insufficient documentation

## 2015-02-18 DIAGNOSIS — E1122 Type 2 diabetes mellitus with diabetic chronic kidney disease: Secondary | ICD-10-CM | POA: Insufficient documentation

## 2015-02-18 DIAGNOSIS — Z95820 Peripheral vascular angioplasty status with implants and grafts: Secondary | ICD-10-CM | POA: Insufficient documentation

## 2015-02-18 DIAGNOSIS — L97412 Non-pressure chronic ulcer of right heel and midfoot with fat layer exposed: Secondary | ICD-10-CM | POA: Diagnosis not present

## 2015-02-19 DIAGNOSIS — N186 End stage renal disease: Secondary | ICD-10-CM | POA: Diagnosis not present

## 2015-02-19 DIAGNOSIS — Z992 Dependence on renal dialysis: Secondary | ICD-10-CM | POA: Diagnosis not present

## 2015-02-19 DIAGNOSIS — E11621 Type 2 diabetes mellitus with foot ulcer: Secondary | ICD-10-CM | POA: Diagnosis not present

## 2015-02-19 DIAGNOSIS — E1122 Type 2 diabetes mellitus with diabetic chronic kidney disease: Secondary | ICD-10-CM | POA: Diagnosis not present

## 2015-02-19 DIAGNOSIS — L98492 Non-pressure chronic ulcer of skin of other sites with fat layer exposed: Secondary | ICD-10-CM | POA: Diagnosis not present

## 2015-02-19 DIAGNOSIS — I120 Hypertensive chronic kidney disease with stage 5 chronic kidney disease or end stage renal disease: Secondary | ICD-10-CM | POA: Diagnosis not present

## 2015-02-19 LAB — GLUCOSE, CAPILLARY
GLUCOSE-CAPILLARY: 192 mg/dL — AB (ref 65–99)
Glucose-Capillary: 123 mg/dL — ABNORMAL HIGH (ref 65–99)

## 2015-02-20 DIAGNOSIS — Z992 Dependence on renal dialysis: Secondary | ICD-10-CM | POA: Diagnosis not present

## 2015-02-20 DIAGNOSIS — I70235 Atherosclerosis of native arteries of right leg with ulceration of other part of foot: Secondary | ICD-10-CM | POA: Diagnosis not present

## 2015-02-20 DIAGNOSIS — E11621 Type 2 diabetes mellitus with foot ulcer: Secondary | ICD-10-CM | POA: Diagnosis not present

## 2015-02-20 DIAGNOSIS — N186 End stage renal disease: Secondary | ICD-10-CM | POA: Diagnosis not present

## 2015-02-20 DIAGNOSIS — L98492 Non-pressure chronic ulcer of skin of other sites with fat layer exposed: Secondary | ICD-10-CM | POA: Diagnosis not present

## 2015-02-20 DIAGNOSIS — I70262 Atherosclerosis of native arteries of extremities with gangrene, left leg: Secondary | ICD-10-CM | POA: Diagnosis not present

## 2015-02-20 DIAGNOSIS — I120 Hypertensive chronic kidney disease with stage 5 chronic kidney disease or end stage renal disease: Secondary | ICD-10-CM | POA: Diagnosis not present

## 2015-02-20 DIAGNOSIS — E1122 Type 2 diabetes mellitus with diabetic chronic kidney disease: Secondary | ICD-10-CM | POA: Diagnosis not present

## 2015-02-20 LAB — GLUCOSE, CAPILLARY
Glucose-Capillary: 228 mg/dL — ABNORMAL HIGH (ref 65–99)
Glucose-Capillary: 118 mg/dL — ABNORMAL HIGH (ref 65–99)

## 2015-02-21 DIAGNOSIS — L98492 Non-pressure chronic ulcer of skin of other sites with fat layer exposed: Secondary | ICD-10-CM | POA: Diagnosis not present

## 2015-02-21 DIAGNOSIS — E1122 Type 2 diabetes mellitus with diabetic chronic kidney disease: Secondary | ICD-10-CM | POA: Diagnosis not present

## 2015-02-21 DIAGNOSIS — I120 Hypertensive chronic kidney disease with stage 5 chronic kidney disease or end stage renal disease: Secondary | ICD-10-CM | POA: Diagnosis not present

## 2015-02-21 DIAGNOSIS — D509 Iron deficiency anemia, unspecified: Secondary | ICD-10-CM | POA: Diagnosis not present

## 2015-02-21 DIAGNOSIS — D631 Anemia in chronic kidney disease: Secondary | ICD-10-CM | POA: Diagnosis not present

## 2015-02-21 DIAGNOSIS — N186 End stage renal disease: Secondary | ICD-10-CM | POA: Diagnosis not present

## 2015-02-21 DIAGNOSIS — N2581 Secondary hyperparathyroidism of renal origin: Secondary | ICD-10-CM | POA: Diagnosis not present

## 2015-02-21 DIAGNOSIS — E1129 Type 2 diabetes mellitus with other diabetic kidney complication: Secondary | ICD-10-CM | POA: Diagnosis not present

## 2015-02-21 DIAGNOSIS — Z992 Dependence on renal dialysis: Secondary | ICD-10-CM | POA: Diagnosis not present

## 2015-02-21 DIAGNOSIS — E11621 Type 2 diabetes mellitus with foot ulcer: Secondary | ICD-10-CM | POA: Diagnosis not present

## 2015-02-21 LAB — GLUCOSE, CAPILLARY
Glucose-Capillary: 215 mg/dL — ABNORMAL HIGH (ref 65–99)
Glucose-Capillary: 118 mg/dL — ABNORMAL HIGH (ref 65–99)

## 2015-02-22 DIAGNOSIS — E1122 Type 2 diabetes mellitus with diabetic chronic kidney disease: Secondary | ICD-10-CM | POA: Diagnosis not present

## 2015-02-22 DIAGNOSIS — E11621 Type 2 diabetes mellitus with foot ulcer: Secondary | ICD-10-CM | POA: Diagnosis not present

## 2015-02-22 DIAGNOSIS — L97412 Non-pressure chronic ulcer of right heel and midfoot with fat layer exposed: Secondary | ICD-10-CM | POA: Diagnosis not present

## 2015-02-22 DIAGNOSIS — L98492 Non-pressure chronic ulcer of skin of other sites with fat layer exposed: Secondary | ICD-10-CM | POA: Diagnosis not present

## 2015-02-22 DIAGNOSIS — I120 Hypertensive chronic kidney disease with stage 5 chronic kidney disease or end stage renal disease: Secondary | ICD-10-CM | POA: Diagnosis not present

## 2015-02-22 DIAGNOSIS — Z992 Dependence on renal dialysis: Secondary | ICD-10-CM | POA: Diagnosis not present

## 2015-02-22 DIAGNOSIS — L97422 Non-pressure chronic ulcer of left heel and midfoot with fat layer exposed: Secondary | ICD-10-CM | POA: Diagnosis not present

## 2015-02-22 DIAGNOSIS — N186 End stage renal disease: Secondary | ICD-10-CM | POA: Diagnosis not present

## 2015-02-22 LAB — GLUCOSE, CAPILLARY
GLUCOSE-CAPILLARY: 157 mg/dL — AB (ref 65–99)
Glucose-Capillary: 268 mg/dL — ABNORMAL HIGH (ref 65–99)

## 2015-02-23 DIAGNOSIS — E1129 Type 2 diabetes mellitus with other diabetic kidney complication: Secondary | ICD-10-CM | POA: Diagnosis not present

## 2015-02-23 DIAGNOSIS — N186 End stage renal disease: Secondary | ICD-10-CM | POA: Diagnosis not present

## 2015-02-23 DIAGNOSIS — D509 Iron deficiency anemia, unspecified: Secondary | ICD-10-CM | POA: Diagnosis not present

## 2015-02-23 DIAGNOSIS — D631 Anemia in chronic kidney disease: Secondary | ICD-10-CM | POA: Diagnosis not present

## 2015-02-23 DIAGNOSIS — N2581 Secondary hyperparathyroidism of renal origin: Secondary | ICD-10-CM | POA: Diagnosis not present

## 2015-02-25 DIAGNOSIS — N186 End stage renal disease: Secondary | ICD-10-CM | POA: Diagnosis not present

## 2015-02-25 DIAGNOSIS — Z992 Dependence on renal dialysis: Secondary | ICD-10-CM | POA: Diagnosis not present

## 2015-02-25 DIAGNOSIS — E11621 Type 2 diabetes mellitus with foot ulcer: Secondary | ICD-10-CM | POA: Diagnosis not present

## 2015-02-25 DIAGNOSIS — L98492 Non-pressure chronic ulcer of skin of other sites with fat layer exposed: Secondary | ICD-10-CM | POA: Diagnosis not present

## 2015-02-25 DIAGNOSIS — I120 Hypertensive chronic kidney disease with stage 5 chronic kidney disease or end stage renal disease: Secondary | ICD-10-CM | POA: Diagnosis not present

## 2015-02-25 DIAGNOSIS — E1122 Type 2 diabetes mellitus with diabetic chronic kidney disease: Secondary | ICD-10-CM | POA: Diagnosis not present

## 2015-02-25 LAB — GLUCOSE, CAPILLARY
Glucose-Capillary: 277 mg/dL — ABNORMAL HIGH (ref 65–99)
Glucose-Capillary: 165 mg/dL — ABNORMAL HIGH (ref 65–99)

## 2015-02-26 DIAGNOSIS — E1129 Type 2 diabetes mellitus with other diabetic kidney complication: Secondary | ICD-10-CM | POA: Diagnosis not present

## 2015-02-26 DIAGNOSIS — E1122 Type 2 diabetes mellitus with diabetic chronic kidney disease: Secondary | ICD-10-CM | POA: Diagnosis not present

## 2015-02-26 DIAGNOSIS — D631 Anemia in chronic kidney disease: Secondary | ICD-10-CM | POA: Diagnosis not present

## 2015-02-26 DIAGNOSIS — N186 End stage renal disease: Secondary | ICD-10-CM | POA: Diagnosis not present

## 2015-02-26 DIAGNOSIS — I120 Hypertensive chronic kidney disease with stage 5 chronic kidney disease or end stage renal disease: Secondary | ICD-10-CM | POA: Diagnosis not present

## 2015-02-26 DIAGNOSIS — N2581 Secondary hyperparathyroidism of renal origin: Secondary | ICD-10-CM | POA: Diagnosis not present

## 2015-02-26 DIAGNOSIS — D509 Iron deficiency anemia, unspecified: Secondary | ICD-10-CM | POA: Diagnosis not present

## 2015-02-26 DIAGNOSIS — E11621 Type 2 diabetes mellitus with foot ulcer: Secondary | ICD-10-CM | POA: Diagnosis not present

## 2015-02-26 DIAGNOSIS — L98492 Non-pressure chronic ulcer of skin of other sites with fat layer exposed: Secondary | ICD-10-CM | POA: Diagnosis not present

## 2015-02-26 DIAGNOSIS — Z992 Dependence on renal dialysis: Secondary | ICD-10-CM | POA: Diagnosis not present

## 2015-02-26 LAB — GLUCOSE, CAPILLARY
Glucose-Capillary: 226 mg/dL — ABNORMAL HIGH (ref 65–99)
Glucose-Capillary: 123 mg/dL — ABNORMAL HIGH (ref 65–99)

## 2015-02-27 DIAGNOSIS — L97522 Non-pressure chronic ulcer of other part of left foot with fat layer exposed: Secondary | ICD-10-CM | POA: Diagnosis not present

## 2015-02-27 DIAGNOSIS — E1122 Type 2 diabetes mellitus with diabetic chronic kidney disease: Secondary | ICD-10-CM | POA: Diagnosis not present

## 2015-02-27 DIAGNOSIS — Z992 Dependence on renal dialysis: Secondary | ICD-10-CM | POA: Diagnosis not present

## 2015-02-27 DIAGNOSIS — I70262 Atherosclerosis of native arteries of extremities with gangrene, left leg: Secondary | ICD-10-CM | POA: Diagnosis not present

## 2015-02-27 DIAGNOSIS — I120 Hypertensive chronic kidney disease with stage 5 chronic kidney disease or end stage renal disease: Secondary | ICD-10-CM | POA: Diagnosis not present

## 2015-02-27 DIAGNOSIS — L98492 Non-pressure chronic ulcer of skin of other sites with fat layer exposed: Secondary | ICD-10-CM | POA: Diagnosis not present

## 2015-02-27 DIAGNOSIS — E11621 Type 2 diabetes mellitus with foot ulcer: Secondary | ICD-10-CM | POA: Diagnosis not present

## 2015-02-27 DIAGNOSIS — N186 End stage renal disease: Secondary | ICD-10-CM | POA: Diagnosis not present

## 2015-02-27 DIAGNOSIS — E1152 Type 2 diabetes mellitus with diabetic peripheral angiopathy with gangrene: Secondary | ICD-10-CM | POA: Diagnosis not present

## 2015-02-27 LAB — GLUCOSE, CAPILLARY
Glucose-Capillary: 174 mg/dL — ABNORMAL HIGH (ref 65–99)
Glucose-Capillary: 119 mg/dL — ABNORMAL HIGH (ref 65–99)

## 2015-02-28 DIAGNOSIS — L98492 Non-pressure chronic ulcer of skin of other sites with fat layer exposed: Secondary | ICD-10-CM | POA: Diagnosis not present

## 2015-02-28 DIAGNOSIS — N186 End stage renal disease: Secondary | ICD-10-CM | POA: Diagnosis not present

## 2015-02-28 DIAGNOSIS — Z992 Dependence on renal dialysis: Secondary | ICD-10-CM | POA: Diagnosis not present

## 2015-02-28 DIAGNOSIS — N2581 Secondary hyperparathyroidism of renal origin: Secondary | ICD-10-CM | POA: Diagnosis not present

## 2015-02-28 DIAGNOSIS — E1122 Type 2 diabetes mellitus with diabetic chronic kidney disease: Secondary | ICD-10-CM | POA: Diagnosis not present

## 2015-02-28 DIAGNOSIS — I120 Hypertensive chronic kidney disease with stage 5 chronic kidney disease or end stage renal disease: Secondary | ICD-10-CM | POA: Diagnosis not present

## 2015-02-28 DIAGNOSIS — D631 Anemia in chronic kidney disease: Secondary | ICD-10-CM | POA: Diagnosis not present

## 2015-02-28 DIAGNOSIS — D509 Iron deficiency anemia, unspecified: Secondary | ICD-10-CM | POA: Diagnosis not present

## 2015-02-28 DIAGNOSIS — E1129 Type 2 diabetes mellitus with other diabetic kidney complication: Secondary | ICD-10-CM | POA: Diagnosis not present

## 2015-02-28 DIAGNOSIS — E11621 Type 2 diabetes mellitus with foot ulcer: Secondary | ICD-10-CM | POA: Diagnosis not present

## 2015-02-28 LAB — GLUCOSE, CAPILLARY
Glucose-Capillary: 194 mg/dL — ABNORMAL HIGH (ref 65–99)
Glucose-Capillary: 139 mg/dL — ABNORMAL HIGH (ref 65–99)

## 2015-03-01 ENCOUNTER — Encounter: Payer: Self-pay | Admitting: Surgery

## 2015-03-01 DIAGNOSIS — L98492 Non-pressure chronic ulcer of skin of other sites with fat layer exposed: Secondary | ICD-10-CM | POA: Diagnosis not present

## 2015-03-01 DIAGNOSIS — I120 Hypertensive chronic kidney disease with stage 5 chronic kidney disease or end stage renal disease: Secondary | ICD-10-CM | POA: Diagnosis not present

## 2015-03-01 DIAGNOSIS — Z992 Dependence on renal dialysis: Secondary | ICD-10-CM | POA: Diagnosis not present

## 2015-03-01 DIAGNOSIS — N186 End stage renal disease: Secondary | ICD-10-CM | POA: Diagnosis not present

## 2015-03-01 DIAGNOSIS — E11621 Type 2 diabetes mellitus with foot ulcer: Secondary | ICD-10-CM | POA: Diagnosis not present

## 2015-03-01 DIAGNOSIS — E1122 Type 2 diabetes mellitus with diabetic chronic kidney disease: Secondary | ICD-10-CM | POA: Diagnosis not present

## 2015-03-01 DIAGNOSIS — L97422 Non-pressure chronic ulcer of left heel and midfoot with fat layer exposed: Secondary | ICD-10-CM | POA: Diagnosis not present

## 2015-03-01 DIAGNOSIS — L97412 Non-pressure chronic ulcer of right heel and midfoot with fat layer exposed: Secondary | ICD-10-CM | POA: Diagnosis not present

## 2015-03-01 LAB — GLUCOSE, CAPILLARY
GLUCOSE-CAPILLARY: 154 mg/dL — AB (ref 65–99)
Glucose-Capillary: 285 mg/dL — ABNORMAL HIGH (ref 65–99)

## 2015-03-02 DIAGNOSIS — N2581 Secondary hyperparathyroidism of renal origin: Secondary | ICD-10-CM | POA: Diagnosis not present

## 2015-03-02 DIAGNOSIS — N186 End stage renal disease: Secondary | ICD-10-CM | POA: Diagnosis not present

## 2015-03-02 DIAGNOSIS — D631 Anemia in chronic kidney disease: Secondary | ICD-10-CM | POA: Diagnosis not present

## 2015-03-02 DIAGNOSIS — D509 Iron deficiency anemia, unspecified: Secondary | ICD-10-CM | POA: Diagnosis not present

## 2015-03-02 DIAGNOSIS — E1129 Type 2 diabetes mellitus with other diabetic kidney complication: Secondary | ICD-10-CM | POA: Diagnosis not present

## 2015-03-04 ENCOUNTER — Ambulatory Visit (INDEPENDENT_AMBULATORY_CARE_PROVIDER_SITE_OTHER): Payer: Medicare Other | Admitting: Surgery

## 2015-03-04 ENCOUNTER — Encounter: Payer: Self-pay | Admitting: Surgery

## 2015-03-04 VITALS — BP 163/62 | HR 76 | Temp 98.7°F | Ht 68.0 in | Wt 129.2 lb

## 2015-03-04 DIAGNOSIS — N186 End stage renal disease: Secondary | ICD-10-CM | POA: Diagnosis not present

## 2015-03-04 DIAGNOSIS — I70209 Unspecified atherosclerosis of native arteries of extremities, unspecified extremity: Secondary | ICD-10-CM

## 2015-03-04 DIAGNOSIS — Z992 Dependence on renal dialysis: Secondary | ICD-10-CM | POA: Diagnosis not present

## 2015-03-04 DIAGNOSIS — L98492 Non-pressure chronic ulcer of skin of other sites with fat layer exposed: Secondary | ICD-10-CM | POA: Diagnosis not present

## 2015-03-04 DIAGNOSIS — I739 Peripheral vascular disease, unspecified: Secondary | ICD-10-CM | POA: Diagnosis not present

## 2015-03-04 DIAGNOSIS — I120 Hypertensive chronic kidney disease with stage 5 chronic kidney disease or end stage renal disease: Secondary | ICD-10-CM | POA: Diagnosis not present

## 2015-03-04 DIAGNOSIS — L98499 Non-pressure chronic ulcer of skin of other sites with unspecified severity: Secondary | ICD-10-CM | POA: Diagnosis not present

## 2015-03-04 DIAGNOSIS — E11621 Type 2 diabetes mellitus with foot ulcer: Secondary | ICD-10-CM | POA: Diagnosis not present

## 2015-03-04 DIAGNOSIS — E1122 Type 2 diabetes mellitus with diabetic chronic kidney disease: Secondary | ICD-10-CM | POA: Diagnosis not present

## 2015-03-04 LAB — GLUCOSE, CAPILLARY
Glucose-Capillary: 238 mg/dL — ABNORMAL HIGH (ref 65–99)
Glucose-Capillary: 144 mg/dL — ABNORMAL HIGH (ref 65–99)

## 2015-03-04 NOTE — Progress Notes (Signed)
[facility] [place]  HPI: 80 y/o female with known arterial disease is here for a follow up visit concerning her left foot.  She has known atherosclerosis with poor wound healing since April 24 th 2016.  She is being followed by [facility] wound center and has recently started hyperbaric chamber treatments since July 5th.     Outpatient Encounter Prescriptions as of 03/04/2015  Medication Sig Note  . albuterol (PROVENTIL HFA;VENTOLIN HFA) 108 (90 BASE) MCG/ACT inhaler Inhale 1-2 puffs into the lungs every 6 (six) hours as needed for wheezing.   . B Complex-C-Folic Acid (B COMPLEX-VITAMIN C-FOLIC ACID) 1 MG tablet Take 1 tablet by mouth daily with breakfast.   . calcitRIOL (ROCALTROL) 0.5 MCG capsule Take 2 capsules (1 mcg total) by mouth Every Tuesday,Thursday,and Saturday with dialysis.   . cefTAZidime 2 g in dextrose 5 % 50 mL Inject 2 g into the vein Every Tuesday,Thursday,and Saturday with dialysis.   Marland Kitchen FOSRENOL 1000 MG chewable tablet Chew 1,000 mg by mouth 3 (three) times daily with meals.    . insulin aspart (NOVOLOG) 100 UNIT/ML injection Inject 5 Units into the skin daily as needed for high blood sugar.  08/21/2012: Taken only when sugar is "real high"  . insulin detemir (LEVEMIR) 100 UNIT/ML injection Inject 0.1 mLs (10 Units total) into the skin at bedtime.   . IRON PO Take 1 tablet by mouth daily.   Marland Kitchen oxyCODONE (ROXICODONE) 5 MG immediate release tablet Take 1 tablet (5 mg total) by mouth every 4 (four) hours as needed for moderate pain.   . pregabalin (LYRICA) 50 MG capsule Take 50 mg by mouth 3 (three) times daily as needed. For nerve pain in feet per patient   . saccharomyces boulardii (FLORASTOR) 250 MG capsule Take 1 capsule (250 mg total) by mouth 2 (two) times daily.   . silver sulfADIAZINE (SILVADENE) 1 % cream Apply topically daily. Apply Silvadene to bilat foot wounds Q day, then cover with gauze and kerlex.  Remove all previous Silvadene before  re-applying more each time.   . vancomycin 500 mg in sodium chloride 0.9 % 100 mL Inject 500 mg into the vein Every Tuesday,Thursday,and Saturday with dialysis.    No facility-administered encounter medications on file as of 03/04/2015.    Objective 163/62 76 98.7 F (37.1 C) (Oral)   100%   Review of Systems  Constitutional: Negative for fever and chills.  HENT: Negative for congestion.   Eyes: Negative for blurred vision.  Respiratory: Negative for cough and hemoptysis.   Cardiovascular: Negative for chest pain and palpitations.  Gastrointestinal: Negative for heartburn, nausea and vomiting.  Genitourinary: Negative for dysuria.  Skin: Negative for rash.  Neurological: Negative for dizziness, speech change and headaches.  Psychiatric/Behavioral: Negative for depression and memory loss.      PE: Lungs non labored breathing Heart RRR Left foot dorsum beefy red base with minimal yellow eschar First, second and third toes with dry gangrene Musculoskeletal ambulates independently with straight cane Abdomin soft NTTP, + BS  Assessment: Left foot ulcer, gangrene first, second and third toes  Plan The left foot is looking significantly better.  She will continue her current treatment plan with the wound center and hyperbaric chamber.  We will have her follow up in 1 month.  The toes will declare themselves with time.    Jackie [name] Jackie [name]   I agree with the above.  I have seen and evaluated the patient.  When  I initially saw her I was concerned that she was given in the with bilateral lower extremity amputations because of the amount of tissue loss.  She underwent angiography by Dr. Imogene Burn and attempted recannulization of a right tibial vessel which was unsuccessful.  She was sent for wound care.  Initially I was very pleased with the progress that she made and therefore elected to continue with wound care.  She is now doing hyperbarics.  Her right foot wound has  healed.  She still has ischemic changes to the first 3 toes on the left and an area of granulation tissue extending across the ankle joint but this is dramatically better.  I have told her that I'm very pleased with the progress that she is making.  We will continue down this path until the healing stops progressing and then determine what level" the left leg she is going to need.  She will follow-up with me in 6 weeks.  Durene Cal

## 2015-03-05 DIAGNOSIS — L98492 Non-pressure chronic ulcer of skin of other sites with fat layer exposed: Secondary | ICD-10-CM | POA: Diagnosis not present

## 2015-03-05 DIAGNOSIS — D509 Iron deficiency anemia, unspecified: Secondary | ICD-10-CM | POA: Diagnosis not present

## 2015-03-05 DIAGNOSIS — E11621 Type 2 diabetes mellitus with foot ulcer: Secondary | ICD-10-CM | POA: Diagnosis not present

## 2015-03-05 DIAGNOSIS — I120 Hypertensive chronic kidney disease with stage 5 chronic kidney disease or end stage renal disease: Secondary | ICD-10-CM | POA: Diagnosis not present

## 2015-03-05 DIAGNOSIS — N2581 Secondary hyperparathyroidism of renal origin: Secondary | ICD-10-CM | POA: Diagnosis not present

## 2015-03-05 DIAGNOSIS — Z992 Dependence on renal dialysis: Secondary | ICD-10-CM | POA: Diagnosis not present

## 2015-03-05 DIAGNOSIS — E1122 Type 2 diabetes mellitus with diabetic chronic kidney disease: Secondary | ICD-10-CM | POA: Diagnosis not present

## 2015-03-05 DIAGNOSIS — D631 Anemia in chronic kidney disease: Secondary | ICD-10-CM | POA: Diagnosis not present

## 2015-03-05 DIAGNOSIS — N186 End stage renal disease: Secondary | ICD-10-CM | POA: Diagnosis not present

## 2015-03-05 DIAGNOSIS — E1129 Type 2 diabetes mellitus with other diabetic kidney complication: Secondary | ICD-10-CM | POA: Diagnosis not present

## 2015-03-05 LAB — GLUCOSE, CAPILLARY
GLUCOSE-CAPILLARY: 219 mg/dL — AB (ref 65–99)
GLUCOSE-CAPILLARY: 144 mg/dL — AB (ref 65–99)

## 2015-03-06 DIAGNOSIS — E1152 Type 2 diabetes mellitus with diabetic peripheral angiopathy with gangrene: Secondary | ICD-10-CM | POA: Diagnosis not present

## 2015-03-06 DIAGNOSIS — Z992 Dependence on renal dialysis: Secondary | ICD-10-CM | POA: Diagnosis not present

## 2015-03-06 DIAGNOSIS — L97512 Non-pressure chronic ulcer of other part of right foot with fat layer exposed: Secondary | ICD-10-CM | POA: Diagnosis not present

## 2015-03-06 DIAGNOSIS — L98492 Non-pressure chronic ulcer of skin of other sites with fat layer exposed: Secondary | ICD-10-CM | POA: Diagnosis not present

## 2015-03-06 DIAGNOSIS — E11621 Type 2 diabetes mellitus with foot ulcer: Secondary | ICD-10-CM | POA: Diagnosis not present

## 2015-03-06 DIAGNOSIS — E1122 Type 2 diabetes mellitus with diabetic chronic kidney disease: Secondary | ICD-10-CM | POA: Diagnosis not present

## 2015-03-06 DIAGNOSIS — I70235 Atherosclerosis of native arteries of right leg with ulceration of other part of foot: Secondary | ICD-10-CM | POA: Diagnosis not present

## 2015-03-06 DIAGNOSIS — N186 End stage renal disease: Secondary | ICD-10-CM | POA: Diagnosis not present

## 2015-03-06 DIAGNOSIS — I70262 Atherosclerosis of native arteries of extremities with gangrene, left leg: Secondary | ICD-10-CM | POA: Diagnosis not present

## 2015-03-06 DIAGNOSIS — I120 Hypertensive chronic kidney disease with stage 5 chronic kidney disease or end stage renal disease: Secondary | ICD-10-CM | POA: Diagnosis not present

## 2015-03-06 LAB — GLUCOSE, CAPILLARY
GLUCOSE-CAPILLARY: 266 mg/dL — AB (ref 65–99)
GLUCOSE-CAPILLARY: 171 mg/dL — AB (ref 65–99)

## 2015-03-07 DIAGNOSIS — D631 Anemia in chronic kidney disease: Secondary | ICD-10-CM | POA: Diagnosis not present

## 2015-03-07 DIAGNOSIS — L98492 Non-pressure chronic ulcer of skin of other sites with fat layer exposed: Secondary | ICD-10-CM | POA: Diagnosis not present

## 2015-03-07 DIAGNOSIS — E1122 Type 2 diabetes mellitus with diabetic chronic kidney disease: Secondary | ICD-10-CM | POA: Diagnosis not present

## 2015-03-07 DIAGNOSIS — Z992 Dependence on renal dialysis: Secondary | ICD-10-CM | POA: Diagnosis not present

## 2015-03-07 DIAGNOSIS — N186 End stage renal disease: Secondary | ICD-10-CM | POA: Diagnosis not present

## 2015-03-07 DIAGNOSIS — E11621 Type 2 diabetes mellitus with foot ulcer: Secondary | ICD-10-CM | POA: Diagnosis not present

## 2015-03-07 DIAGNOSIS — N2581 Secondary hyperparathyroidism of renal origin: Secondary | ICD-10-CM | POA: Diagnosis not present

## 2015-03-07 DIAGNOSIS — I120 Hypertensive chronic kidney disease with stage 5 chronic kidney disease or end stage renal disease: Secondary | ICD-10-CM | POA: Diagnosis not present

## 2015-03-07 DIAGNOSIS — E1129 Type 2 diabetes mellitus with other diabetic kidney complication: Secondary | ICD-10-CM | POA: Diagnosis not present

## 2015-03-07 DIAGNOSIS — D509 Iron deficiency anemia, unspecified: Secondary | ICD-10-CM | POA: Diagnosis not present

## 2015-03-07 LAB — GLUCOSE, CAPILLARY
GLUCOSE-CAPILLARY: 169 mg/dL — AB (ref 65–99)
GLUCOSE-CAPILLARY: 123 mg/dL — AB (ref 65–99)

## 2015-03-08 DIAGNOSIS — N186 End stage renal disease: Secondary | ICD-10-CM | POA: Diagnosis not present

## 2015-03-08 DIAGNOSIS — L97422 Non-pressure chronic ulcer of left heel and midfoot with fat layer exposed: Secondary | ICD-10-CM | POA: Diagnosis not present

## 2015-03-08 DIAGNOSIS — L97412 Non-pressure chronic ulcer of right heel and midfoot with fat layer exposed: Secondary | ICD-10-CM | POA: Diagnosis not present

## 2015-03-08 DIAGNOSIS — E1122 Type 2 diabetes mellitus with diabetic chronic kidney disease: Secondary | ICD-10-CM | POA: Diagnosis not present

## 2015-03-08 DIAGNOSIS — I120 Hypertensive chronic kidney disease with stage 5 chronic kidney disease or end stage renal disease: Secondary | ICD-10-CM | POA: Diagnosis not present

## 2015-03-08 DIAGNOSIS — E11621 Type 2 diabetes mellitus with foot ulcer: Secondary | ICD-10-CM | POA: Diagnosis not present

## 2015-03-08 DIAGNOSIS — L98492 Non-pressure chronic ulcer of skin of other sites with fat layer exposed: Secondary | ICD-10-CM | POA: Diagnosis not present

## 2015-03-08 DIAGNOSIS — Z992 Dependence on renal dialysis: Secondary | ICD-10-CM | POA: Diagnosis not present

## 2015-03-08 LAB — GLUCOSE, CAPILLARY
GLUCOSE-CAPILLARY: 214 mg/dL — AB (ref 65–99)
Glucose-Capillary: 162 mg/dL — ABNORMAL HIGH (ref 65–99)

## 2015-03-09 DIAGNOSIS — D509 Iron deficiency anemia, unspecified: Secondary | ICD-10-CM | POA: Diagnosis not present

## 2015-03-09 DIAGNOSIS — E1129 Type 2 diabetes mellitus with other diabetic kidney complication: Secondary | ICD-10-CM | POA: Diagnosis not present

## 2015-03-09 DIAGNOSIS — N2581 Secondary hyperparathyroidism of renal origin: Secondary | ICD-10-CM | POA: Diagnosis not present

## 2015-03-09 DIAGNOSIS — D631 Anemia in chronic kidney disease: Secondary | ICD-10-CM | POA: Diagnosis not present

## 2015-03-09 DIAGNOSIS — N186 End stage renal disease: Secondary | ICD-10-CM | POA: Diagnosis not present

## 2015-03-11 DIAGNOSIS — L97412 Non-pressure chronic ulcer of right heel and midfoot with fat layer exposed: Secondary | ICD-10-CM | POA: Diagnosis not present

## 2015-03-11 DIAGNOSIS — E1122 Type 2 diabetes mellitus with diabetic chronic kidney disease: Secondary | ICD-10-CM | POA: Diagnosis not present

## 2015-03-11 DIAGNOSIS — I120 Hypertensive chronic kidney disease with stage 5 chronic kidney disease or end stage renal disease: Secondary | ICD-10-CM | POA: Diagnosis not present

## 2015-03-11 DIAGNOSIS — Z992 Dependence on renal dialysis: Secondary | ICD-10-CM | POA: Diagnosis not present

## 2015-03-11 DIAGNOSIS — L97422 Non-pressure chronic ulcer of left heel and midfoot with fat layer exposed: Secondary | ICD-10-CM | POA: Diagnosis not present

## 2015-03-11 DIAGNOSIS — E11621 Type 2 diabetes mellitus with foot ulcer: Secondary | ICD-10-CM | POA: Diagnosis not present

## 2015-03-11 DIAGNOSIS — N186 End stage renal disease: Secondary | ICD-10-CM | POA: Diagnosis not present

## 2015-03-11 DIAGNOSIS — L98492 Non-pressure chronic ulcer of skin of other sites with fat layer exposed: Secondary | ICD-10-CM | POA: Diagnosis not present

## 2015-03-11 LAB — GLUCOSE, CAPILLARY
GLUCOSE-CAPILLARY: 233 mg/dL — AB (ref 65–99)
GLUCOSE-CAPILLARY: 180 mg/dL — AB (ref 65–99)

## 2015-03-12 DIAGNOSIS — Z992 Dependence on renal dialysis: Secondary | ICD-10-CM | POA: Diagnosis not present

## 2015-03-12 DIAGNOSIS — I120 Hypertensive chronic kidney disease with stage 5 chronic kidney disease or end stage renal disease: Secondary | ICD-10-CM | POA: Diagnosis not present

## 2015-03-12 DIAGNOSIS — D631 Anemia in chronic kidney disease: Secondary | ICD-10-CM | POA: Diagnosis not present

## 2015-03-12 DIAGNOSIS — D509 Iron deficiency anemia, unspecified: Secondary | ICD-10-CM | POA: Diagnosis not present

## 2015-03-12 DIAGNOSIS — N186 End stage renal disease: Secondary | ICD-10-CM | POA: Diagnosis not present

## 2015-03-12 DIAGNOSIS — E1122 Type 2 diabetes mellitus with diabetic chronic kidney disease: Secondary | ICD-10-CM | POA: Diagnosis not present

## 2015-03-12 DIAGNOSIS — E11621 Type 2 diabetes mellitus with foot ulcer: Secondary | ICD-10-CM | POA: Diagnosis not present

## 2015-03-12 DIAGNOSIS — E1129 Type 2 diabetes mellitus with other diabetic kidney complication: Secondary | ICD-10-CM | POA: Diagnosis not present

## 2015-03-12 DIAGNOSIS — N2581 Secondary hyperparathyroidism of renal origin: Secondary | ICD-10-CM | POA: Diagnosis not present

## 2015-03-12 DIAGNOSIS — L98492 Non-pressure chronic ulcer of skin of other sites with fat layer exposed: Secondary | ICD-10-CM | POA: Diagnosis not present

## 2015-03-12 LAB — GLUCOSE, CAPILLARY
Glucose-Capillary: 196 mg/dL — ABNORMAL HIGH (ref 65–99)
Glucose-Capillary: 100 mg/dL — ABNORMAL HIGH (ref 65–99)

## 2015-03-13 DIAGNOSIS — L97422 Non-pressure chronic ulcer of left heel and midfoot with fat layer exposed: Secondary | ICD-10-CM | POA: Diagnosis not present

## 2015-03-13 DIAGNOSIS — E11621 Type 2 diabetes mellitus with foot ulcer: Secondary | ICD-10-CM | POA: Diagnosis not present

## 2015-03-13 DIAGNOSIS — L98492 Non-pressure chronic ulcer of skin of other sites with fat layer exposed: Secondary | ICD-10-CM | POA: Diagnosis not present

## 2015-03-13 DIAGNOSIS — N186 End stage renal disease: Secondary | ICD-10-CM | POA: Diagnosis not present

## 2015-03-13 DIAGNOSIS — I120 Hypertensive chronic kidney disease with stage 5 chronic kidney disease or end stage renal disease: Secondary | ICD-10-CM | POA: Diagnosis not present

## 2015-03-13 DIAGNOSIS — Z992 Dependence on renal dialysis: Secondary | ICD-10-CM | POA: Diagnosis not present

## 2015-03-13 DIAGNOSIS — E1122 Type 2 diabetes mellitus with diabetic chronic kidney disease: Secondary | ICD-10-CM | POA: Diagnosis not present

## 2015-03-13 LAB — GLUCOSE, CAPILLARY
GLUCOSE-CAPILLARY: 200 mg/dL — AB (ref 65–99)
GLUCOSE-CAPILLARY: 125 mg/dL — AB (ref 65–99)

## 2015-03-14 DIAGNOSIS — D509 Iron deficiency anemia, unspecified: Secondary | ICD-10-CM | POA: Diagnosis not present

## 2015-03-14 DIAGNOSIS — I120 Hypertensive chronic kidney disease with stage 5 chronic kidney disease or end stage renal disease: Secondary | ICD-10-CM | POA: Diagnosis not present

## 2015-03-14 DIAGNOSIS — E11621 Type 2 diabetes mellitus with foot ulcer: Secondary | ICD-10-CM | POA: Diagnosis not present

## 2015-03-14 DIAGNOSIS — E1122 Type 2 diabetes mellitus with diabetic chronic kidney disease: Secondary | ICD-10-CM | POA: Diagnosis not present

## 2015-03-14 DIAGNOSIS — N2581 Secondary hyperparathyroidism of renal origin: Secondary | ICD-10-CM | POA: Diagnosis not present

## 2015-03-14 DIAGNOSIS — D631 Anemia in chronic kidney disease: Secondary | ICD-10-CM | POA: Diagnosis not present

## 2015-03-14 DIAGNOSIS — L98492 Non-pressure chronic ulcer of skin of other sites with fat layer exposed: Secondary | ICD-10-CM | POA: Diagnosis not present

## 2015-03-14 DIAGNOSIS — N186 End stage renal disease: Secondary | ICD-10-CM | POA: Diagnosis not present

## 2015-03-14 DIAGNOSIS — E1129 Type 2 diabetes mellitus with other diabetic kidney complication: Secondary | ICD-10-CM | POA: Diagnosis not present

## 2015-03-14 DIAGNOSIS — Z992 Dependence on renal dialysis: Secondary | ICD-10-CM | POA: Diagnosis not present

## 2015-03-14 LAB — GLUCOSE, CAPILLARY
GLUCOSE-CAPILLARY: 238 mg/dL — AB (ref 65–99)
Glucose-Capillary: 163 mg/dL — ABNORMAL HIGH (ref 65–99)

## 2015-03-15 ENCOUNTER — Other Ambulatory Visit: Payer: Self-pay

## 2015-03-15 DIAGNOSIS — I120 Hypertensive chronic kidney disease with stage 5 chronic kidney disease or end stage renal disease: Secondary | ICD-10-CM | POA: Diagnosis not present

## 2015-03-15 DIAGNOSIS — L97412 Non-pressure chronic ulcer of right heel and midfoot with fat layer exposed: Secondary | ICD-10-CM | POA: Diagnosis not present

## 2015-03-15 DIAGNOSIS — L97422 Non-pressure chronic ulcer of left heel and midfoot with fat layer exposed: Secondary | ICD-10-CM | POA: Diagnosis not present

## 2015-03-15 DIAGNOSIS — N186 End stage renal disease: Secondary | ICD-10-CM | POA: Diagnosis not present

## 2015-03-15 DIAGNOSIS — E11621 Type 2 diabetes mellitus with foot ulcer: Secondary | ICD-10-CM | POA: Diagnosis not present

## 2015-03-15 DIAGNOSIS — Z992 Dependence on renal dialysis: Secondary | ICD-10-CM | POA: Diagnosis not present

## 2015-03-15 DIAGNOSIS — I871 Compression of vein: Secondary | ICD-10-CM | POA: Diagnosis not present

## 2015-03-15 DIAGNOSIS — E1122 Type 2 diabetes mellitus with diabetic chronic kidney disease: Secondary | ICD-10-CM | POA: Diagnosis not present

## 2015-03-15 DIAGNOSIS — T82858D Stenosis of vascular prosthetic devices, implants and grafts, subsequent encounter: Secondary | ICD-10-CM | POA: Diagnosis not present

## 2015-03-16 DIAGNOSIS — N2581 Secondary hyperparathyroidism of renal origin: Secondary | ICD-10-CM | POA: Diagnosis not present

## 2015-03-16 DIAGNOSIS — D631 Anemia in chronic kidney disease: Secondary | ICD-10-CM | POA: Diagnosis not present

## 2015-03-16 DIAGNOSIS — D509 Iron deficiency anemia, unspecified: Secondary | ICD-10-CM | POA: Diagnosis not present

## 2015-03-16 DIAGNOSIS — N186 End stage renal disease: Secondary | ICD-10-CM | POA: Diagnosis not present

## 2015-03-16 DIAGNOSIS — E1129 Type 2 diabetes mellitus with other diabetic kidney complication: Secondary | ICD-10-CM | POA: Diagnosis not present

## 2015-03-18 DIAGNOSIS — I120 Hypertensive chronic kidney disease with stage 5 chronic kidney disease or end stage renal disease: Secondary | ICD-10-CM | POA: Diagnosis not present

## 2015-03-18 DIAGNOSIS — D631 Anemia in chronic kidney disease: Secondary | ICD-10-CM | POA: Diagnosis not present

## 2015-03-18 DIAGNOSIS — N186 End stage renal disease: Secondary | ICD-10-CM | POA: Diagnosis not present

## 2015-03-18 DIAGNOSIS — E1122 Type 2 diabetes mellitus with diabetic chronic kidney disease: Secondary | ICD-10-CM | POA: Diagnosis not present

## 2015-03-18 DIAGNOSIS — E11621 Type 2 diabetes mellitus with foot ulcer: Secondary | ICD-10-CM | POA: Diagnosis not present

## 2015-03-18 DIAGNOSIS — L98492 Non-pressure chronic ulcer of skin of other sites with fat layer exposed: Secondary | ICD-10-CM | POA: Diagnosis not present

## 2015-03-18 DIAGNOSIS — N2581 Secondary hyperparathyroidism of renal origin: Secondary | ICD-10-CM | POA: Diagnosis not present

## 2015-03-18 DIAGNOSIS — Z992 Dependence on renal dialysis: Secondary | ICD-10-CM | POA: Diagnosis not present

## 2015-03-18 DIAGNOSIS — D509 Iron deficiency anemia, unspecified: Secondary | ICD-10-CM | POA: Diagnosis not present

## 2015-03-18 DIAGNOSIS — E1129 Type 2 diabetes mellitus with other diabetic kidney complication: Secondary | ICD-10-CM | POA: Diagnosis not present

## 2015-03-18 LAB — GLUCOSE, CAPILLARY
GLUCOSE-CAPILLARY: 116 mg/dL — AB (ref 65–99)
Glucose-Capillary: 214 mg/dL — ABNORMAL HIGH (ref 65–99)

## 2015-03-19 ENCOUNTER — Ambulatory Visit (HOSPITAL_COMMUNITY)
Admission: RE | Admit: 2015-03-19 | Discharge: 2015-03-19 | Disposition: A | Discharge status: Home / Self Care | Payer: Medicare Other | Source: Ambulatory Visit | Attending: Surgery | Admitting: Surgery

## 2015-03-19 ENCOUNTER — Encounter (HOSPITAL_COMMUNITY)
Admission: RE | Disposition: A | Discharge status: Home / Self Care | Payer: Self-pay | Source: Ambulatory Visit | Attending: Surgery

## 2015-03-19 DIAGNOSIS — I96 Gangrene, not elsewhere classified: Secondary | ICD-10-CM | POA: Diagnosis not present

## 2015-03-19 DIAGNOSIS — L97529 Non-pressure chronic ulcer of other part of left foot with unspecified severity: Secondary | ICD-10-CM | POA: Insufficient documentation

## 2015-03-19 DIAGNOSIS — I70262 Atherosclerosis of native arteries of extremities with gangrene, left leg: Secondary | ICD-10-CM | POA: Diagnosis not present

## 2015-03-19 DIAGNOSIS — I739 Peripheral vascular disease, unspecified: Secondary | ICD-10-CM | POA: Diagnosis present

## 2015-03-19 DIAGNOSIS — I70245 Atherosclerosis of native arteries of left leg with ulceration of other part of foot: Secondary | ICD-10-CM | POA: Diagnosis not present

## 2015-03-19 HISTORY — PX: PERIPHERAL VASCULAR CATHETERIZATION: SHX172C

## 2015-03-19 LAB — POCT I-STAT, CHEM 8
BUN: 40 mg/dL — ABNORMAL HIGH (ref 6–20)
Calcium, Ion: 1 mmol/L — ABNORMAL LOW (ref 1.13–1.30)
Chloride: 97 mmol/L — ABNORMAL LOW (ref 101–111)
Creatinine, Ser: 4.4 mg/dL — ABNORMAL HIGH (ref 0.44–1.00)
GLUCOSE: 161 mg/dL — AB (ref 65–99)
HEMATOCRIT: 36 % (ref 36.0–46.0)
HEMOGLOBIN: 12.2 g/dL (ref 12.0–15.0)
POTASSIUM: 5.2 mmol/L — AB (ref 3.5–5.1)
Sodium: 138 mmol/L (ref 135–145)
TCO2: 31 mmol/L (ref 0–100)

## 2015-03-19 LAB — POCT ACTIVATED CLOTTING TIME
ACTIVATED CLOTTING TIME: 251 s
ACTIVATED CLOTTING TIME: 245 s
ACTIVATED CLOTTING TIME: 263 s
ACTIVATED CLOTTING TIME: 183 s

## 2015-03-19 LAB — GLUCOSE, CAPILLARY: Glucose-Capillary: 97 mg/dL (ref 65–99)

## 2015-03-19 SURGERY — LOWER EXTREMITY ANGIOGRAPHY
Anesthesia: LOCAL

## 2015-03-19 MED ORDER — ACETAMINOPHEN 325 MG RE SUPP
325.0000 mg | RECTAL | Status: DC | PRN
  Filled 2015-03-19: qty 2

## 2015-03-19 MED ORDER — ONDANSETRON HCL 4 MG/2ML IJ SOLN: 4.0000 mg | Freq: Four times a day (QID) | INTRAMUSCULAR | Status: DC | PRN

## 2015-03-19 MED ORDER — PHENOL 1.4 % MT LIQD: 1.0000 | OROMUCOSAL | Status: DC | PRN

## 2015-03-19 MED ORDER — NITROGLYCERIN IN D5W 200-5 MCG/ML-% IV SOLN
INTRAVENOUS | Status: AC
  Filled 2015-03-19: qty 250

## 2015-03-19 MED ORDER — HEPARIN (PORCINE) IN NACL 2-0.9 UNIT/ML-% IJ SOLN
INTRAMUSCULAR | Status: DC | PRN
  Administered 2015-03-19: 500 mL

## 2015-03-19 MED ORDER — NITROGLYCERIN IN D5W 200-5 MCG/ML-% IV SOLN
INTRAVENOUS | Status: DC | PRN
  Administered 2015-03-19: 5 mg via INTRAVENOUS

## 2015-03-19 MED ORDER — HYDRALAZINE HCL 20 MG/ML IJ SOLN
INTRAMUSCULAR | Status: AC
  Filled 2015-03-19: qty 1

## 2015-03-19 MED ORDER — OXYCODONE-ACETAMINOPHEN 5-325 MG PO TABS
ORAL_TABLET | ORAL | Status: AC
  Filled 2015-03-19: qty 1

## 2015-03-19 MED ORDER — NITROGLYCERIN 1 MG/10 ML FOR IR/CATH LAB
INTRA_ARTERIAL | Status: DC | PRN
  Administered 2015-03-19: 300 ug

## 2015-03-19 MED ORDER — OXYCODONE-ACETAMINOPHEN 5-325 MG PO TABS
1.0000 | ORAL_TABLET | ORAL | Status: DC | PRN
  Administered 2015-03-19: 1 via ORAL
  Filled 2015-03-19: qty 2

## 2015-03-19 MED ORDER — HEPARIN SODIUM (PORCINE) 1000 UNIT/ML IJ SOLN
INTRAMUSCULAR | Status: DC | PRN
  Administered 2015-03-19: 7000 [IU] via INTRAVENOUS
  Administered 2015-03-19: 2000 [IU] via INTRAVENOUS

## 2015-03-19 MED ORDER — VERAPAMIL HCL 2.5 MG/ML IV SOLN
INTRAVENOUS | Status: AC
Start: 2015-03-19 — End: 2015-03-19
  Filled 2015-03-19: qty 2

## 2015-03-19 MED ORDER — LIDOCAINE HCL (PF) 1 % IJ SOLN
INTRAMUSCULAR | Status: AC
  Filled 2015-03-19: qty 30

## 2015-03-19 MED ORDER — CLOPIDOGREL BISULFATE 75 MG PO TABS
ORAL_TABLET | ORAL | Status: AC
  Filled 2015-03-19: qty 1

## 2015-03-19 MED ORDER — DOCUSATE SODIUM 100 MG PO CAPS: 100.0000 mg | ORAL_CAPSULE | Freq: Every day | ORAL | Status: DC

## 2015-03-19 MED ORDER — LIDOCAINE HCL (PF) 1 % IJ SOLN
INTRAMUSCULAR | Status: DC | PRN
  Administered 2015-03-19: 15 mL

## 2015-03-19 MED ORDER — METOPROLOL TARTRATE 1 MG/ML IV SOLN: 2.0000 mg | INTRAVENOUS | Status: DC | PRN

## 2015-03-19 MED ORDER — FENTANYL CITRATE (PF) 100 MCG/2ML IJ SOLN
INTRAMUSCULAR | Status: DC | PRN
  Administered 2015-03-19: 25 ug via INTRAVENOUS

## 2015-03-19 MED ORDER — GUAIFENESIN-DM 100-10 MG/5ML PO SYRP
15.0000 mL | ORAL_SOLUTION | ORAL | Status: DC | PRN
  Filled 2015-03-19: qty 15

## 2015-03-19 MED ORDER — LABETALOL HCL 5 MG/ML IV SOLN: 10.0000 mg | INTRAVENOUS | Status: DC | PRN

## 2015-03-19 MED ORDER — IODIXANOL 320 MG/ML IV SOLN
INTRAVENOUS | Status: DC | PRN
  Administered 2015-03-19: 225 mL via INTRA_ARTERIAL

## 2015-03-19 MED ORDER — CLOPIDOGREL BISULFATE 75 MG PO TABS
ORAL_TABLET | ORAL | Status: DC | PRN
  Administered 2015-03-19: 75 mg via ORAL

## 2015-03-19 MED ORDER — MIDAZOLAM HCL 2 MG/2ML IJ SOLN
INTRAMUSCULAR | Status: DC | PRN
  Administered 2015-03-19: 1 mg via INTRAVENOUS

## 2015-03-19 MED ORDER — ACETAMINOPHEN 325 MG PO TABS
325.0000 mg | ORAL_TABLET | ORAL | Status: DC | PRN
  Filled 2015-03-19: qty 2

## 2015-03-19 MED ORDER — SODIUM CHLORIDE 0.9 % IJ SOLN: 3.0000 mL | INTRAMUSCULAR | Status: DC | PRN

## 2015-03-19 MED ORDER — ALUM & MAG HYDROXIDE-SIMETH 200-200-20 MG/5ML PO SUSP
15.0000 mL | ORAL | Status: DC | PRN
  Filled 2015-03-19: qty 30

## 2015-03-19 MED ORDER — MIDAZOLAM HCL 2 MG/2ML IJ SOLN
INTRAMUSCULAR | Status: AC
  Filled 2015-03-19: qty 4

## 2015-03-19 MED ORDER — HEPARIN SODIUM (PORCINE) 1000 UNIT/ML IJ SOLN
INTRAMUSCULAR | Status: AC
  Filled 2015-03-19: qty 1

## 2015-03-19 MED ORDER — VERAPAMIL HCL 2.5 MG/ML IV SOLN
INTRAVENOUS | Status: DC | PRN
  Administered 2015-03-19: 5 mg

## 2015-03-19 MED ORDER — HEPARIN (PORCINE) IN NACL 2-0.9 UNIT/ML-% IJ SOLN
INTRAMUSCULAR | Status: AC
Start: 2015-03-19 — End: 2015-03-19
  Filled 2015-03-19: qty 1000

## 2015-03-19 MED ORDER — HYDRALAZINE HCL 20 MG/ML IJ SOLN
5.0000 mg | INTRAMUSCULAR | Status: DC | PRN
  Administered 2015-03-19: 5 mg via INTRAVENOUS

## 2015-03-19 MED ORDER — FENTANYL CITRATE (PF) 100 MCG/2ML IJ SOLN
INTRAMUSCULAR | Status: AC
  Filled 2015-03-19: qty 4

## 2015-03-19 MED ORDER — MORPHINE SULFATE (PF) 10 MG/ML IV SOLN: 2.0000 mg | INTRAVENOUS | Status: DC | PRN

## 2015-03-19 SURGICAL SUPPLY — 32 items
BALLN ARMADA 5X80X135 (BALLOONS) ×3
BALLN LUTONIX 5X120X130 (BALLOONS) ×3
BALLN LUTONIX DCB 5X60X130 (BALLOONS) ×3
BALLOON ARMADA 5X80X135 (BALLOONS) ×2 IMPLANT
BALLOON LUTONIX 5X120X130 (BALLOONS) ×2 IMPLANT
BALLOON LUTONIX DCB 5X60X130 (BALLOONS) ×2 IMPLANT
BALLOON POWERFLX PRO 5X40X135 (BALLOONS) ×3 IMPLANT
CATH NAVICROSS ANGLED 135CM (MICROCATHETER) ×3 IMPLANT
CATH OMNI FLUSH 5F 65CM (CATHETERS) ×3 IMPLANT
CATH QUICKCROSS .035X135CM (MICROCATHETER) ×3 IMPLANT
COVER PRB 48X5XTLSCP FOLD TPE (BAG) ×2 IMPLANT
COVER PROBE 5X48 (BAG) ×1
DEVICE TORQUE H2O (MISCELLANEOUS) ×3 IMPLANT
DIAMONDBACK CLASSIC OAS 2.0MM (CATHETERS) ×3
DRAPE ZERO GRAVITY STERILE (DRAPES) ×3 IMPLANT
GUIDEWIRE ANGLED .035X260CM (WIRE) ×6 IMPLANT
KIT ENCORE 26 ADVANTAGE (KITS) ×3 IMPLANT
KIT MICROINTRODUCER STIFF 5F (SHEATH) ×3 IMPLANT
KIT PV (KITS) ×3 IMPLANT
LUBRICANT VIPERSLIDE CORONARY (MISCELLANEOUS) ×3 IMPLANT
SHEATH PINNACLE 5F 10CM (SHEATH) ×3 IMPLANT
SHEATH PINNACLE ST 7F 45CM (SHEATH) ×3 IMPLANT
SHIELD RADPAD SCOOP 12X17 (MISCELLANEOUS) ×3 IMPLANT
STENT SMART CONTROL 6X40X120 (Permanent Stent) ×3 IMPLANT
SYR MEDRAD MARK V 150ML (SYRINGE) ×3 IMPLANT
SYSTEM DIMODBCK CLSC OAS 2.0MM (CATHETERS) ×2 IMPLANT
TAPE RADIOPAQUE TURBO (MISCELLANEOUS) ×3 IMPLANT
TRANSDUCER W/STOPCOCK (MISCELLANEOUS) ×3 IMPLANT
TRAY PV CATH (CUSTOM PROCEDURE TRAY) ×3 IMPLANT
WIRE BENTSON .035X145CM (WIRE) ×3 IMPLANT
WIRE HI TORQ VERSACORE J 260CM (WIRE) ×6 IMPLANT
WIRE VIPER ADVANCE .017X335CM (WIRE) ×3 IMPLANT

## 2015-03-19 NOTE — Op Note (Signed)
Patient name: Jackie [name] MRN: [id number] DOB: 1935-03-03 Sex: female  03/19/2015 Pre-operative Diagnosis: left foot ulcer Post-operative diagnosis:  Same Surgeon:  Durene Cal Procedure Performed:  1.  U/s guided access, right femoral artery  2.  Abdominal aortogram  3.  Left lower extremity runoff  4.  Intra-arterial administration much motion  5.  Atherectomy with drug-coated balloon angioplasty, left superficial femoral artery  6.  Atherectomy followed by drug coated balloon angioplasty with residual dissection treated with stenting of the left popliteal artery  Findings::  There were 2 lesions in the left leg treated.  The first was in the distal superficial femoral artery.  3 treatment stenosis was 95%.  This was treated with CSI 2.0 classic atherectomy device followed by drug coated balloon angioplasty using a Lutonix 5 x 1 20 balloon.  Residual stenosis was less than 10%.  The second lesion was in the popliteal artery just proximal to the joint space.  This was treated with CSI 2.0 classic atherectomy device followed by drug coated balloon angioplasty using a 5 x 60 balloon.  There was a nonflow limiting dissection of this area which I ultimately treated with a Cordis 6 x 40 self-expanding stent with residual stenosis of 0%   Indications:  The patient presented initially with bilateral nonhealing wounds.  The right side has resolved, she still has a wound on the left with dry gangrene.  She is here today for possible revascularization.  Procedure:  The patient was identified in the holding area and taken to room 8.  The patient was then placed supine on the table and prepped and draped in the usual sterile fashion.  A time out was called.  Ultrasound was used to evaluate the right common femoral artery.  It was patent .  A digital ultrasound image was acquired.  A micropuncture needle was used to access the right common femoral artery under ultrasound guidance.  An 018 wire was  advanced without resistance and a micropuncture sheath was placed.  The 018 wire was removed and a benson wire was placed.  The micropuncture sheath was exchanged for a 5 french sheath.  An omniflush catheter was advanced over the wire to the level of L-1.  An abdominal angiogram was obtained.  Next, using the omniflush catheter and a benson wire, the aortic bifurcation was crossed and the catheter was placed into theleft external iliac artery and left runoff was obtained.    Findings:   Aortogram: no significant renal artery stenosis is identified.  The infrarenal abdominal aorta is patent without significant stenosis.  Bilateral common and external iliac arteries are patent without significant stenosis.     Left Lower Extremity:  the left common femoral and profunda femoral artery are patent throughout their course without hemodynamically significant stenosis.  The superficial femoral artery is diffusely calcified.  In the region of the adductor canal there is a diffusely diseased segment of approximately 10 cm in length.  Maximum stenosis is 95% over a distance of approximately 5 cm.  There is a normal-appearing proximal popliteal artery and in the popliteal artery just proximal to the joint space there is a 95% stenosis for a short distance of approximately 2 cm.  The below knee popliteal artery is patent throughout it's course.  Tibial vessels are patent proximal however, diffusely diseased with short segment occlusion.  There is reconstitution of the posterior tibial and dorsalis pedis.    Intervention:  after the above images were acquired, the decision  was made to proceed with intervention.  Over a 035 wire, a 7 French sheath was advanced into the left external iliac artery.  The patient was fully heparinized.  Using a Glidewire and a quick cross as well as a angled glide catheter I was able to successfully cross the 2 lesions.  A contrast injection was performed with the catheter in the below knee  popliteal artery, confirming placement into the below knee popliteal artery.  A Viper wire was then placed.  A CSI 2.0 classic device was then inserted.  400  nitroglycerin were administered with a catheter in the superficial femoral artery.  I performed atherectomy in 2 separate locations.  This was done at low medium and high-speed in both locations.  I then used a 5 x 80 balloon to perform predilation of both lesions.  I then perform drug coated balloon angioplasty of the first lesion in the superficial femoral artery using a  Lutonix 5 x 1 20 balloon.  This was held up for 2 minutes at nominal pressure.  This balloon was then removed and a second drug coated balloon, a 5 x 60 balloon was used to perform drug coated balloon angioplasty in the popliteal artery.  This was also held for 2 minutes at nominal pressure.  Completion angiography revealed widely patent superficial femoral artery.  The residual stenosis in the treated area was less than 10%.  The popliteal artery treated area was widely patent however there did appear to be a nonflow limiting type a dissection.  I elected to stent this area.  A 6 x 40 self-expanding stent was deployed across the dissection and then molded to confirmation with a 5 mm balloon.  Repeat imaging showed a widely patent below-knee popliteal artery with no stenosis.  Runoff remained unchanged.  At this point the decision was made to terminate the procedure.  Catheters and wires were removed.  The patient was taken holding area for sheath pull once her coagulation profile corrects.  The sheath was withdrawn to the right external iliac artery.    Impression:  #1  Successful atherectomy and drug coated balloon angioplasty of 2 separate lesions.  The first was in the distal superficial femoral artery on the left.  The second was in the popliteal artery just above the joint space.  The proximal lesion was treated with a CSI classic 2.0 device and a 5 x 1 20 Lutonix drug coated  balloon.  The second lesion was treated with a CSI classic 2.0 device and a 5 x 60 Lutonix drug coated balloon.  There was a residual dissection which was nonflow limiting that I elected to treat with a 6 x 40 self-expanding stent  #2  severe tibial disease      V. Durene Cal, M.D. [facility] [place] Office: [phone] Pager:  [phone]

## 2015-03-19 NOTE — Progress Notes (Signed)
Site area: RFA Site Prior to Removal:  Level 0 Pressure Applied For:47min Manual: yes   Patient Status During Pull:  stable Post Pull Site:  Level 0 Post Pull Instructions Given:  yes Post Pull Pulses Present: doppler Dressing Applied:  clear Bedrest begins @ 1440 [id number] Comments:

## 2015-03-19 NOTE — H&P (View-Only) (Signed)
 [facility] [place]  HPI: 80 y/o female with known arterial disease is here for a follow up visit concerning her left foot.  She has known atherosclerosis with poor wound healing since April 24 th 2016.  She is being followed by [facility] wound center and has recently started hyperbaric chamber treatments since July 5th.     Outpatient Encounter Prescriptions as of 03/04/2015  Medication Sig Note  . albuterol (PROVENTIL HFA;VENTOLIN HFA) 108 (90 BASE) MCG/ACT inhaler Inhale 1-2 puffs into the lungs every 6 (six) hours as needed for wheezing.   . B Complex-C-Folic Acid (B COMPLEX-VITAMIN C-FOLIC ACID) 1 MG tablet Take 1 tablet by mouth daily with breakfast.   . calcitRIOL (ROCALTROL) 0.5 MCG capsule Take 2 capsules (1 mcg total) by mouth Every Tuesday,Thursday,and Saturday with dialysis.   . cefTAZidime 2 g in dextrose 5 % 50 mL Inject 2 g into the vein Every Tuesday,Thursday,and Saturday with dialysis.   . FOSRENOL 1000 MG chewable tablet Chew 1,000 mg by mouth 3 (three) times daily with meals.    . insulin aspart (NOVOLOG) 100 UNIT/ML injection Inject 5 Units into the skin daily as needed for high blood sugar.  08/21/2012: Taken only when sugar is "real high"  . insulin detemir (LEVEMIR) 100 UNIT/ML injection Inject 0.1 mLs (10 Units total) into the skin at bedtime.   . IRON PO Take 1 tablet by mouth daily.   . oxyCODONE (ROXICODONE) 5 MG immediate release tablet Take 1 tablet (5 mg total) by mouth every 4 (four) hours as needed for moderate pain.   . pregabalin (LYRICA) 50 MG capsule Take 50 mg by mouth 3 (three) times daily as needed. For nerve pain in feet per patient   . saccharomyces boulardii (FLORASTOR) 250 MG capsule Take 1 capsule (250 mg total) by mouth 2 (two) times daily.   . silver sulfADIAZINE (SILVADENE) 1 % cream Apply topically daily. Apply Silvadene to bilat foot wounds Q day, then cover with gauze and kerlex.  Remove all previous Silvadene before  re-applying more each time.   . vancomycin 500 mg in sodium chloride 0.9 % 100 mL Inject 500 mg into the vein Every Tuesday,Thursday,and Saturday with dialysis.    No facility-administered encounter medications on file as of 03/04/2015.    Objective 163/62 76 98.7 F (37.1 C) (Oral)   100%   Review of Systems  Constitutional: Negative for fever and chills.  HENT: Negative for congestion.   Eyes: Negative for blurred vision.  Respiratory: Negative for cough and hemoptysis.   Cardiovascular: Negative for chest pain and palpitations.  Gastrointestinal: Negative for heartburn, nausea and vomiting.  Genitourinary: Negative for dysuria.  Skin: Negative for rash.  Neurological: Negative for dizziness, speech change and headaches.  Psychiatric/Behavioral: Negative for depression and memory loss.      PE: Lungs non labored breathing Heart RRR Left foot dorsum beefy red base with minimal yellow eschar First, second and third toes with dry gangrene Musculoskeletal ambulates independently with straight cane Abdomin soft NTTP, + BS  Assessment: Left foot ulcer, gangrene first, second and third toes  Plan The left foot is looking significantly better.  She will continue her current treatment plan with the wound center and hyperbaric chamber.  We will have her follow up in 1 month.  The toes will declare themselves with time.    COLLINS, EMMA MAUREEN PA-C   I agree with the above.  I have seen and evaluated the patient.  When   I initially saw her I was concerned that she was given in the with bilateral lower extremity amputations because of the amount of tissue loss.  She underwent angiography by Dr. Chen and attempted recannulization of a right tibial vessel which was unsuccessful.  She was sent for wound care.  Initially I was very pleased with the progress that she made and therefore elected to continue with wound care.  She is now doing hyperbarics.  Her right foot wound has  healed.  She still has ischemic changes to the first 3 toes on the left and an area of granulation tissue extending across the ankle joint but this is dramatically better.  I have told her that I'm very pleased with the progress that she is making.  We will continue down this path until the healing stops progressing and then determine what level" the left leg she is going to need.  She will follow-up with me in 6 weeks.  Wells Aquita Simmering 

## 2015-03-19 NOTE — Progress Notes (Signed)
Dr Myra Gianotti in to assess left foot/ dressing was changed. Prescription for Plavix was given and is to be started tomorrow, 03/20/15

## 2015-03-19 NOTE — Research (Signed)
SAFE-DCB Informed Consent   Subject Name: Jackie [name]  Subject met inclusion and exclusion criteria.  The informed consent form, study requirements and expectations were reviewed with the subject and questions and concerns were addressed prior to the signing of the consent form.  The subject verbalized understanding of the trail requirements.  The subject agreed to participate in the SAFE-DCB trial and signed the informed consent.  The informed consent was obtained prior to performance of any protocol-specific procedures for the subject.  A copy of the signed informed consent was given to the subject and a copy was placed in the subject's medical record.  Hedrick,Tammy W 03/19/2015, 0745

## 2015-03-19 NOTE — Interval H&P Note (Signed)
History and Physical Interval Note:  03/19/2015 9:47 AM  Jackie [name]  has presented today for surgery, with the diagnosis of pvd with non healing ulcer left foot, gaingrene second and third toe of left foot  The various methods of treatment have been discussed with the patient and family. After consideration of risks, benefits and other options for treatment, the patient has consented to  Procedure(s): Lower Extremity Angiography (N/A) as a surgical intervention .  The patient's history has been reviewed, patient examined, no change in status, stable for surgery.  I have reviewed the patient's chart and labs.  Questions were answered to the patient's satisfaction.     Durene Cal  Spoke with wound center.  Will proceed with angio and then amputation  Wells Zain Lankford

## 2015-03-19 NOTE — Discharge Instructions (Signed)

## 2015-03-20 ENCOUNTER — Encounter (HOSPITAL_COMMUNITY): Payer: Self-pay | Admitting: Surgery

## 2015-03-20 DIAGNOSIS — I120 Hypertensive chronic kidney disease with stage 5 chronic kidney disease or end stage renal disease: Secondary | ICD-10-CM | POA: Diagnosis not present

## 2015-03-20 DIAGNOSIS — L98492 Non-pressure chronic ulcer of skin of other sites with fat layer exposed: Secondary | ICD-10-CM | POA: Diagnosis not present

## 2015-03-20 DIAGNOSIS — E1152 Type 2 diabetes mellitus with diabetic peripheral angiopathy with gangrene: Secondary | ICD-10-CM | POA: Diagnosis not present

## 2015-03-20 DIAGNOSIS — Z992 Dependence on renal dialysis: Secondary | ICD-10-CM | POA: Diagnosis not present

## 2015-03-20 DIAGNOSIS — N186 End stage renal disease: Secondary | ICD-10-CM | POA: Diagnosis not present

## 2015-03-20 DIAGNOSIS — E1122 Type 2 diabetes mellitus with diabetic chronic kidney disease: Secondary | ICD-10-CM | POA: Diagnosis not present

## 2015-03-20 DIAGNOSIS — E1129 Type 2 diabetes mellitus with other diabetic kidney complication: Secondary | ICD-10-CM | POA: Diagnosis not present

## 2015-03-20 DIAGNOSIS — L97512 Non-pressure chronic ulcer of other part of right foot with fat layer exposed: Secondary | ICD-10-CM | POA: Diagnosis not present

## 2015-03-20 DIAGNOSIS — E11621 Type 2 diabetes mellitus with foot ulcer: Secondary | ICD-10-CM | POA: Diagnosis not present

## 2015-03-20 LAB — GLUCOSE, CAPILLARY
GLUCOSE-CAPILLARY: 159 mg/dL — AB (ref 65–99)
GLUCOSE-CAPILLARY: 213 mg/dL — AB (ref 65–99)

## 2015-03-21 ENCOUNTER — Encounter (HOSPITAL_BASED_OUTPATIENT_CLINIC_OR_DEPARTMENT_OTHER): Payer: Medicare Other | Attending: Internal Medicine

## 2015-03-21 DIAGNOSIS — E11621 Type 2 diabetes mellitus with foot ulcer: Secondary | ICD-10-CM | POA: Diagnosis present

## 2015-03-21 DIAGNOSIS — N2581 Secondary hyperparathyroidism of renal origin: Secondary | ICD-10-CM | POA: Diagnosis not present

## 2015-03-21 DIAGNOSIS — E1152 Type 2 diabetes mellitus with diabetic peripheral angiopathy with gangrene: Secondary | ICD-10-CM | POA: Diagnosis not present

## 2015-03-21 DIAGNOSIS — N186 End stage renal disease: Secondary | ICD-10-CM | POA: Insufficient documentation

## 2015-03-21 DIAGNOSIS — E1140 Type 2 diabetes mellitus with diabetic neuropathy, unspecified: Secondary | ICD-10-CM | POA: Diagnosis not present

## 2015-03-21 DIAGNOSIS — I70262 Atherosclerosis of native arteries of extremities with gangrene, left leg: Secondary | ICD-10-CM | POA: Insufficient documentation

## 2015-03-21 DIAGNOSIS — Z95820 Peripheral vascular angioplasty status with implants and grafts: Secondary | ICD-10-CM | POA: Insufficient documentation

## 2015-03-21 DIAGNOSIS — I120 Hypertensive chronic kidney disease with stage 5 chronic kidney disease or end stage renal disease: Secondary | ICD-10-CM | POA: Diagnosis not present

## 2015-03-21 DIAGNOSIS — L97524 Non-pressure chronic ulcer of other part of left foot with necrosis of bone: Secondary | ICD-10-CM | POA: Insufficient documentation

## 2015-03-21 DIAGNOSIS — D509 Iron deficiency anemia, unspecified: Secondary | ICD-10-CM | POA: Diagnosis not present

## 2015-03-21 DIAGNOSIS — E1129 Type 2 diabetes mellitus with other diabetic kidney complication: Secondary | ICD-10-CM | POA: Diagnosis not present

## 2015-03-21 DIAGNOSIS — Z992 Dependence on renal dialysis: Secondary | ICD-10-CM | POA: Insufficient documentation

## 2015-03-21 DIAGNOSIS — I70245 Atherosclerosis of native arteries of left leg with ulceration of other part of foot: Secondary | ICD-10-CM | POA: Insufficient documentation

## 2015-03-21 DIAGNOSIS — D631 Anemia in chronic kidney disease: Secondary | ICD-10-CM | POA: Diagnosis not present

## 2015-03-21 LAB — GLUCOSE, CAPILLARY
GLUCOSE-CAPILLARY: 231 mg/dL — AB (ref 65–99)
Glucose-Capillary: 180 mg/dL — ABNORMAL HIGH (ref 65–99)

## 2015-03-22 DIAGNOSIS — L97422 Non-pressure chronic ulcer of left heel and midfoot with fat layer exposed: Secondary | ICD-10-CM | POA: Diagnosis not present

## 2015-03-22 DIAGNOSIS — E11621 Type 2 diabetes mellitus with foot ulcer: Secondary | ICD-10-CM | POA: Diagnosis not present

## 2015-03-22 DIAGNOSIS — E1122 Type 2 diabetes mellitus with diabetic chronic kidney disease: Secondary | ICD-10-CM | POA: Diagnosis not present

## 2015-03-22 DIAGNOSIS — L97524 Non-pressure chronic ulcer of other part of left foot with necrosis of bone: Secondary | ICD-10-CM | POA: Diagnosis not present

## 2015-03-22 DIAGNOSIS — I120 Hypertensive chronic kidney disease with stage 5 chronic kidney disease or end stage renal disease: Secondary | ICD-10-CM | POA: Diagnosis not present

## 2015-03-22 DIAGNOSIS — I70245 Atherosclerosis of native arteries of left leg with ulceration of other part of foot: Secondary | ICD-10-CM | POA: Diagnosis not present

## 2015-03-22 DIAGNOSIS — I70262 Atherosclerosis of native arteries of extremities with gangrene, left leg: Secondary | ICD-10-CM | POA: Diagnosis not present

## 2015-03-22 DIAGNOSIS — N186 End stage renal disease: Secondary | ICD-10-CM | POA: Diagnosis not present

## 2015-03-22 DIAGNOSIS — L97412 Non-pressure chronic ulcer of right heel and midfoot with fat layer exposed: Secondary | ICD-10-CM | POA: Diagnosis not present

## 2015-03-22 LAB — GLUCOSE, CAPILLARY
Glucose-Capillary: 181 mg/dL — ABNORMAL HIGH (ref 65–99)
Glucose-Capillary: 120 mg/dL — ABNORMAL HIGH (ref 65–99)

## 2015-03-23 DIAGNOSIS — E1129 Type 2 diabetes mellitus with other diabetic kidney complication: Secondary | ICD-10-CM | POA: Diagnosis not present

## 2015-03-23 DIAGNOSIS — N2581 Secondary hyperparathyroidism of renal origin: Secondary | ICD-10-CM | POA: Diagnosis not present

## 2015-03-23 DIAGNOSIS — D631 Anemia in chronic kidney disease: Secondary | ICD-10-CM | POA: Diagnosis not present

## 2015-03-23 DIAGNOSIS — D509 Iron deficiency anemia, unspecified: Secondary | ICD-10-CM | POA: Diagnosis not present

## 2015-03-23 DIAGNOSIS — N186 End stage renal disease: Secondary | ICD-10-CM | POA: Diagnosis not present

## 2015-03-26 DIAGNOSIS — I120 Hypertensive chronic kidney disease with stage 5 chronic kidney disease or end stage renal disease: Secondary | ICD-10-CM | POA: Diagnosis not present

## 2015-03-26 DIAGNOSIS — N2581 Secondary hyperparathyroidism of renal origin: Secondary | ICD-10-CM | POA: Diagnosis not present

## 2015-03-26 DIAGNOSIS — D631 Anemia in chronic kidney disease: Secondary | ICD-10-CM | POA: Diagnosis not present

## 2015-03-26 DIAGNOSIS — I70245 Atherosclerosis of native arteries of left leg with ulceration of other part of foot: Secondary | ICD-10-CM | POA: Diagnosis not present

## 2015-03-26 DIAGNOSIS — N186 End stage renal disease: Secondary | ICD-10-CM | POA: Diagnosis not present

## 2015-03-26 DIAGNOSIS — E1129 Type 2 diabetes mellitus with other diabetic kidney complication: Secondary | ICD-10-CM | POA: Diagnosis not present

## 2015-03-26 DIAGNOSIS — D509 Iron deficiency anemia, unspecified: Secondary | ICD-10-CM | POA: Diagnosis not present

## 2015-03-26 DIAGNOSIS — I70262 Atherosclerosis of native arteries of extremities with gangrene, left leg: Secondary | ICD-10-CM | POA: Diagnosis not present

## 2015-03-26 DIAGNOSIS — E11621 Type 2 diabetes mellitus with foot ulcer: Secondary | ICD-10-CM | POA: Diagnosis not present

## 2015-03-26 DIAGNOSIS — L97524 Non-pressure chronic ulcer of other part of left foot with necrosis of bone: Secondary | ICD-10-CM | POA: Diagnosis not present

## 2015-03-26 LAB — GLUCOSE, CAPILLARY
Glucose-Capillary: 233 mg/dL — ABNORMAL HIGH (ref 65–99)
Glucose-Capillary: 196 mg/dL — ABNORMAL HIGH (ref 65–99)

## 2015-03-27 DIAGNOSIS — I70262 Atherosclerosis of native arteries of extremities with gangrene, left leg: Secondary | ICD-10-CM | POA: Diagnosis not present

## 2015-03-27 DIAGNOSIS — I120 Hypertensive chronic kidney disease with stage 5 chronic kidney disease or end stage renal disease: Secondary | ICD-10-CM | POA: Diagnosis not present

## 2015-03-27 DIAGNOSIS — L97522 Non-pressure chronic ulcer of other part of left foot with fat layer exposed: Secondary | ICD-10-CM | POA: Diagnosis not present

## 2015-03-27 DIAGNOSIS — I70235 Atherosclerosis of native arteries of right leg with ulceration of other part of foot: Secondary | ICD-10-CM | POA: Diagnosis not present

## 2015-03-27 DIAGNOSIS — L97524 Non-pressure chronic ulcer of other part of left foot with necrosis of bone: Secondary | ICD-10-CM | POA: Diagnosis not present

## 2015-03-27 DIAGNOSIS — E1152 Type 2 diabetes mellitus with diabetic peripheral angiopathy with gangrene: Secondary | ICD-10-CM | POA: Diagnosis not present

## 2015-03-27 DIAGNOSIS — E11621 Type 2 diabetes mellitus with foot ulcer: Secondary | ICD-10-CM | POA: Diagnosis not present

## 2015-03-27 DIAGNOSIS — I70245 Atherosclerosis of native arteries of left leg with ulceration of other part of foot: Secondary | ICD-10-CM | POA: Diagnosis not present

## 2015-03-27 DIAGNOSIS — N186 End stage renal disease: Secondary | ICD-10-CM | POA: Diagnosis not present

## 2015-03-27 LAB — GLUCOSE, CAPILLARY
GLUCOSE-CAPILLARY: 215 mg/dL — AB (ref 65–99)
GLUCOSE-CAPILLARY: 159 mg/dL — AB (ref 65–99)

## 2015-03-28 DIAGNOSIS — D631 Anemia in chronic kidney disease: Secondary | ICD-10-CM | POA: Diagnosis not present

## 2015-03-28 DIAGNOSIS — E11621 Type 2 diabetes mellitus with foot ulcer: Secondary | ICD-10-CM | POA: Diagnosis not present

## 2015-03-28 DIAGNOSIS — D509 Iron deficiency anemia, unspecified: Secondary | ICD-10-CM | POA: Diagnosis not present

## 2015-03-28 DIAGNOSIS — N2581 Secondary hyperparathyroidism of renal origin: Secondary | ICD-10-CM | POA: Diagnosis not present

## 2015-03-28 DIAGNOSIS — L97524 Non-pressure chronic ulcer of other part of left foot with necrosis of bone: Secondary | ICD-10-CM | POA: Diagnosis not present

## 2015-03-28 DIAGNOSIS — I70262 Atherosclerosis of native arteries of extremities with gangrene, left leg: Secondary | ICD-10-CM | POA: Diagnosis not present

## 2015-03-28 DIAGNOSIS — I70245 Atherosclerosis of native arteries of left leg with ulceration of other part of foot: Secondary | ICD-10-CM | POA: Diagnosis not present

## 2015-03-28 DIAGNOSIS — I120 Hypertensive chronic kidney disease with stage 5 chronic kidney disease or end stage renal disease: Secondary | ICD-10-CM | POA: Diagnosis not present

## 2015-03-28 DIAGNOSIS — E1129 Type 2 diabetes mellitus with other diabetic kidney complication: Secondary | ICD-10-CM | POA: Diagnosis not present

## 2015-03-28 DIAGNOSIS — N186 End stage renal disease: Secondary | ICD-10-CM | POA: Diagnosis not present

## 2015-03-28 LAB — GLUCOSE, CAPILLARY
GLUCOSE-CAPILLARY: 157 mg/dL — AB (ref 65–99)
GLUCOSE-CAPILLARY: 139 mg/dL — AB (ref 65–99)

## 2015-03-29 DIAGNOSIS — N186 End stage renal disease: Secondary | ICD-10-CM | POA: Diagnosis not present

## 2015-03-29 DIAGNOSIS — I120 Hypertensive chronic kidney disease with stage 5 chronic kidney disease or end stage renal disease: Secondary | ICD-10-CM | POA: Diagnosis not present

## 2015-03-29 DIAGNOSIS — L97524 Non-pressure chronic ulcer of other part of left foot with necrosis of bone: Secondary | ICD-10-CM | POA: Diagnosis not present

## 2015-03-29 DIAGNOSIS — E11621 Type 2 diabetes mellitus with foot ulcer: Secondary | ICD-10-CM | POA: Diagnosis not present

## 2015-03-29 DIAGNOSIS — I70262 Atherosclerosis of native arteries of extremities with gangrene, left leg: Secondary | ICD-10-CM | POA: Diagnosis not present

## 2015-03-29 DIAGNOSIS — I70245 Atherosclerosis of native arteries of left leg with ulceration of other part of foot: Secondary | ICD-10-CM | POA: Diagnosis not present

## 2015-03-29 LAB — GLUCOSE, CAPILLARY
GLUCOSE-CAPILLARY: 157 mg/dL — AB (ref 65–99)
GLUCOSE-CAPILLARY: 101 mg/dL — AB (ref 65–99)

## 2015-03-30 DIAGNOSIS — D631 Anemia in chronic kidney disease: Secondary | ICD-10-CM | POA: Diagnosis not present

## 2015-03-30 DIAGNOSIS — E1129 Type 2 diabetes mellitus with other diabetic kidney complication: Secondary | ICD-10-CM | POA: Diagnosis not present

## 2015-03-30 DIAGNOSIS — N186 End stage renal disease: Secondary | ICD-10-CM | POA: Diagnosis not present

## 2015-03-30 DIAGNOSIS — D509 Iron deficiency anemia, unspecified: Secondary | ICD-10-CM | POA: Diagnosis not present

## 2015-03-30 DIAGNOSIS — N2581 Secondary hyperparathyroidism of renal origin: Secondary | ICD-10-CM | POA: Diagnosis not present

## 2015-04-01 DIAGNOSIS — I70262 Atherosclerosis of native arteries of extremities with gangrene, left leg: Secondary | ICD-10-CM | POA: Diagnosis not present

## 2015-04-01 DIAGNOSIS — L97524 Non-pressure chronic ulcer of other part of left foot with necrosis of bone: Secondary | ICD-10-CM | POA: Diagnosis not present

## 2015-04-01 DIAGNOSIS — E11621 Type 2 diabetes mellitus with foot ulcer: Secondary | ICD-10-CM | POA: Diagnosis not present

## 2015-04-01 DIAGNOSIS — I871 Compression of vein: Secondary | ICD-10-CM | POA: Diagnosis not present

## 2015-04-01 DIAGNOSIS — T82858D Stenosis of vascular prosthetic devices, implants and grafts, subsequent encounter: Secondary | ICD-10-CM | POA: Diagnosis not present

## 2015-04-01 DIAGNOSIS — N186 End stage renal disease: Secondary | ICD-10-CM | POA: Diagnosis not present

## 2015-04-01 DIAGNOSIS — I70245 Atherosclerosis of native arteries of left leg with ulceration of other part of foot: Secondary | ICD-10-CM | POA: Diagnosis not present

## 2015-04-01 DIAGNOSIS — I120 Hypertensive chronic kidney disease with stage 5 chronic kidney disease or end stage renal disease: Secondary | ICD-10-CM | POA: Diagnosis not present

## 2015-04-01 DIAGNOSIS — Z992 Dependence on renal dialysis: Secondary | ICD-10-CM | POA: Diagnosis not present

## 2015-04-01 LAB — GLUCOSE, CAPILLARY
GLUCOSE-CAPILLARY: 164 mg/dL — AB (ref 65–99)
GLUCOSE-CAPILLARY: 108 mg/dL — AB (ref 65–99)

## 2015-04-03 DIAGNOSIS — E11621 Type 2 diabetes mellitus with foot ulcer: Secondary | ICD-10-CM | POA: Diagnosis not present

## 2015-04-03 DIAGNOSIS — N186 End stage renal disease: Secondary | ICD-10-CM | POA: Diagnosis not present

## 2015-04-03 DIAGNOSIS — L97524 Non-pressure chronic ulcer of other part of left foot with necrosis of bone: Secondary | ICD-10-CM | POA: Diagnosis not present

## 2015-04-03 DIAGNOSIS — L97522 Non-pressure chronic ulcer of other part of left foot with fat layer exposed: Secondary | ICD-10-CM | POA: Diagnosis not present

## 2015-04-03 DIAGNOSIS — I70245 Atherosclerosis of native arteries of left leg with ulceration of other part of foot: Secondary | ICD-10-CM | POA: Diagnosis not present

## 2015-04-03 DIAGNOSIS — I70235 Atherosclerosis of native arteries of right leg with ulceration of other part of foot: Secondary | ICD-10-CM | POA: Diagnosis not present

## 2015-04-03 DIAGNOSIS — I120 Hypertensive chronic kidney disease with stage 5 chronic kidney disease or end stage renal disease: Secondary | ICD-10-CM | POA: Diagnosis not present

## 2015-04-03 DIAGNOSIS — I70262 Atherosclerosis of native arteries of extremities with gangrene, left leg: Secondary | ICD-10-CM | POA: Diagnosis not present

## 2015-04-03 LAB — GLUCOSE, CAPILLARY
GLUCOSE-CAPILLARY: 137 mg/dL — AB (ref 65–99)
Glucose-Capillary: 239 mg/dL — ABNORMAL HIGH (ref 65–99)

## 2015-04-04 DIAGNOSIS — D509 Iron deficiency anemia, unspecified: Secondary | ICD-10-CM | POA: Diagnosis not present

## 2015-04-04 DIAGNOSIS — I120 Hypertensive chronic kidney disease with stage 5 chronic kidney disease or end stage renal disease: Secondary | ICD-10-CM | POA: Diagnosis not present

## 2015-04-04 DIAGNOSIS — I70262 Atherosclerosis of native arteries of extremities with gangrene, left leg: Secondary | ICD-10-CM | POA: Diagnosis not present

## 2015-04-04 DIAGNOSIS — N186 End stage renal disease: Secondary | ICD-10-CM | POA: Diagnosis not present

## 2015-04-04 DIAGNOSIS — I70245 Atherosclerosis of native arteries of left leg with ulceration of other part of foot: Secondary | ICD-10-CM | POA: Diagnosis not present

## 2015-04-04 DIAGNOSIS — E1129 Type 2 diabetes mellitus with other diabetic kidney complication: Secondary | ICD-10-CM | POA: Diagnosis not present

## 2015-04-04 DIAGNOSIS — D631 Anemia in chronic kidney disease: Secondary | ICD-10-CM | POA: Diagnosis not present

## 2015-04-04 DIAGNOSIS — L97524 Non-pressure chronic ulcer of other part of left foot with necrosis of bone: Secondary | ICD-10-CM | POA: Diagnosis not present

## 2015-04-04 DIAGNOSIS — E11621 Type 2 diabetes mellitus with foot ulcer: Secondary | ICD-10-CM | POA: Diagnosis not present

## 2015-04-04 DIAGNOSIS — N2581 Secondary hyperparathyroidism of renal origin: Secondary | ICD-10-CM | POA: Diagnosis not present

## 2015-04-04 LAB — GLUCOSE, CAPILLARY
GLUCOSE-CAPILLARY: 159 mg/dL — AB (ref 65–99)
Glucose-Capillary: 118 mg/dL — ABNORMAL HIGH (ref 65–99)

## 2015-04-05 DIAGNOSIS — I70245 Atherosclerosis of native arteries of left leg with ulceration of other part of foot: Secondary | ICD-10-CM | POA: Diagnosis not present

## 2015-04-05 DIAGNOSIS — I70262 Atherosclerosis of native arteries of extremities with gangrene, left leg: Secondary | ICD-10-CM | POA: Diagnosis not present

## 2015-04-05 DIAGNOSIS — E1122 Type 2 diabetes mellitus with diabetic chronic kidney disease: Secondary | ICD-10-CM | POA: Diagnosis not present

## 2015-04-05 DIAGNOSIS — L97422 Non-pressure chronic ulcer of left heel and midfoot with fat layer exposed: Secondary | ICD-10-CM | POA: Diagnosis not present

## 2015-04-05 DIAGNOSIS — L97524 Non-pressure chronic ulcer of other part of left foot with necrosis of bone: Secondary | ICD-10-CM | POA: Diagnosis not present

## 2015-04-05 DIAGNOSIS — L97412 Non-pressure chronic ulcer of right heel and midfoot with fat layer exposed: Secondary | ICD-10-CM | POA: Diagnosis not present

## 2015-04-05 DIAGNOSIS — E11621 Type 2 diabetes mellitus with foot ulcer: Secondary | ICD-10-CM | POA: Diagnosis not present

## 2015-04-05 DIAGNOSIS — I120 Hypertensive chronic kidney disease with stage 5 chronic kidney disease or end stage renal disease: Secondary | ICD-10-CM | POA: Diagnosis not present

## 2015-04-05 DIAGNOSIS — N186 End stage renal disease: Secondary | ICD-10-CM | POA: Diagnosis not present

## 2015-04-05 LAB — GLUCOSE, CAPILLARY
GLUCOSE-CAPILLARY: 211 mg/dL — AB (ref 65–99)
Glucose-Capillary: 119 mg/dL — ABNORMAL HIGH (ref 65–99)

## 2015-04-06 DIAGNOSIS — N2581 Secondary hyperparathyroidism of renal origin: Secondary | ICD-10-CM | POA: Diagnosis not present

## 2015-04-06 DIAGNOSIS — D631 Anemia in chronic kidney disease: Secondary | ICD-10-CM | POA: Diagnosis not present

## 2015-04-06 DIAGNOSIS — E1129 Type 2 diabetes mellitus with other diabetic kidney complication: Secondary | ICD-10-CM | POA: Diagnosis not present

## 2015-04-06 DIAGNOSIS — D509 Iron deficiency anemia, unspecified: Secondary | ICD-10-CM | POA: Diagnosis not present

## 2015-04-06 DIAGNOSIS — N186 End stage renal disease: Secondary | ICD-10-CM | POA: Diagnosis not present

## 2015-04-08 DIAGNOSIS — I120 Hypertensive chronic kidney disease with stage 5 chronic kidney disease or end stage renal disease: Secondary | ICD-10-CM | POA: Diagnosis not present

## 2015-04-08 DIAGNOSIS — N186 End stage renal disease: Secondary | ICD-10-CM | POA: Diagnosis not present

## 2015-04-08 DIAGNOSIS — L97412 Non-pressure chronic ulcer of right heel and midfoot with fat layer exposed: Secondary | ICD-10-CM | POA: Diagnosis not present

## 2015-04-08 DIAGNOSIS — I70245 Atherosclerosis of native arteries of left leg with ulceration of other part of foot: Secondary | ICD-10-CM | POA: Diagnosis not present

## 2015-04-08 DIAGNOSIS — E11621 Type 2 diabetes mellitus with foot ulcer: Secondary | ICD-10-CM | POA: Diagnosis not present

## 2015-04-08 DIAGNOSIS — E1122 Type 2 diabetes mellitus with diabetic chronic kidney disease: Secondary | ICD-10-CM | POA: Diagnosis not present

## 2015-04-08 DIAGNOSIS — L97524 Non-pressure chronic ulcer of other part of left foot with necrosis of bone: Secondary | ICD-10-CM | POA: Diagnosis not present

## 2015-04-08 DIAGNOSIS — I70262 Atherosclerosis of native arteries of extremities with gangrene, left leg: Secondary | ICD-10-CM | POA: Diagnosis not present

## 2015-04-08 DIAGNOSIS — L97422 Non-pressure chronic ulcer of left heel and midfoot with fat layer exposed: Secondary | ICD-10-CM | POA: Diagnosis not present

## 2015-04-08 LAB — GLUCOSE, CAPILLARY
GLUCOSE-CAPILLARY: 129 mg/dL — AB (ref 65–99)
Glucose-Capillary: 79 mg/dL (ref 65–99)

## 2015-04-09 DIAGNOSIS — I70262 Atherosclerosis of native arteries of extremities with gangrene, left leg: Secondary | ICD-10-CM | POA: Diagnosis not present

## 2015-04-09 DIAGNOSIS — N2581 Secondary hyperparathyroidism of renal origin: Secondary | ICD-10-CM | POA: Diagnosis not present

## 2015-04-09 DIAGNOSIS — Z794 Long term (current) use of insulin: Secondary | ICD-10-CM | POA: Diagnosis not present

## 2015-04-09 DIAGNOSIS — D509 Iron deficiency anemia, unspecified: Secondary | ICD-10-CM | POA: Diagnosis not present

## 2015-04-09 DIAGNOSIS — L97524 Non-pressure chronic ulcer of other part of left foot with necrosis of bone: Secondary | ICD-10-CM | POA: Diagnosis not present

## 2015-04-09 DIAGNOSIS — I120 Hypertensive chronic kidney disease with stage 5 chronic kidney disease or end stage renal disease: Secondary | ICD-10-CM | POA: Diagnosis not present

## 2015-04-09 DIAGNOSIS — E1122 Type 2 diabetes mellitus with diabetic chronic kidney disease: Secondary | ICD-10-CM | POA: Diagnosis not present

## 2015-04-09 DIAGNOSIS — Z4800 Encounter for change or removal of nonsurgical wound dressing: Secondary | ICD-10-CM | POA: Diagnosis not present

## 2015-04-09 DIAGNOSIS — E1129 Type 2 diabetes mellitus with other diabetic kidney complication: Secondary | ICD-10-CM | POA: Diagnosis not present

## 2015-04-09 DIAGNOSIS — N186 End stage renal disease: Secondary | ICD-10-CM | POA: Diagnosis not present

## 2015-04-09 DIAGNOSIS — Z992 Dependence on renal dialysis: Secondary | ICD-10-CM | POA: Diagnosis not present

## 2015-04-09 DIAGNOSIS — E11621 Type 2 diabetes mellitus with foot ulcer: Secondary | ICD-10-CM | POA: Diagnosis not present

## 2015-04-09 DIAGNOSIS — D631 Anemia in chronic kidney disease: Secondary | ICD-10-CM | POA: Diagnosis not present

## 2015-04-09 DIAGNOSIS — Z9181 History of falling: Secondary | ICD-10-CM | POA: Diagnosis not present

## 2015-04-09 DIAGNOSIS — D649 Anemia, unspecified: Secondary | ICD-10-CM | POA: Diagnosis not present

## 2015-04-09 DIAGNOSIS — I70245 Atherosclerosis of native arteries of left leg with ulceration of other part of foot: Secondary | ICD-10-CM | POA: Diagnosis not present

## 2015-04-09 DIAGNOSIS — L97522 Non-pressure chronic ulcer of other part of left foot with fat layer exposed: Secondary | ICD-10-CM | POA: Diagnosis not present

## 2015-04-09 LAB — GLUCOSE, CAPILLARY
GLUCOSE-CAPILLARY: 112 mg/dL — AB (ref 65–99)
GLUCOSE-CAPILLARY: 179 mg/dL — AB (ref 65–99)

## 2015-04-10 ENCOUNTER — Encounter: Payer: Self-pay | Admitting: Surgery

## 2015-04-10 DIAGNOSIS — I120 Hypertensive chronic kidney disease with stage 5 chronic kidney disease or end stage renal disease: Secondary | ICD-10-CM | POA: Diagnosis not present

## 2015-04-10 DIAGNOSIS — I70245 Atherosclerosis of native arteries of left leg with ulceration of other part of foot: Secondary | ICD-10-CM | POA: Diagnosis not present

## 2015-04-10 DIAGNOSIS — L97512 Non-pressure chronic ulcer of other part of right foot with fat layer exposed: Secondary | ICD-10-CM | POA: Diagnosis not present

## 2015-04-10 DIAGNOSIS — E1152 Type 2 diabetes mellitus with diabetic peripheral angiopathy with gangrene: Secondary | ICD-10-CM | POA: Diagnosis not present

## 2015-04-10 DIAGNOSIS — E11621 Type 2 diabetes mellitus with foot ulcer: Secondary | ICD-10-CM | POA: Diagnosis not present

## 2015-04-10 DIAGNOSIS — I70262 Atherosclerosis of native arteries of extremities with gangrene, left leg: Secondary | ICD-10-CM | POA: Diagnosis not present

## 2015-04-10 DIAGNOSIS — N186 End stage renal disease: Secondary | ICD-10-CM | POA: Diagnosis not present

## 2015-04-10 DIAGNOSIS — L97524 Non-pressure chronic ulcer of other part of left foot with necrosis of bone: Secondary | ICD-10-CM | POA: Diagnosis not present

## 2015-04-10 LAB — GLUCOSE, CAPILLARY
Glucose-Capillary: 150 mg/dL — ABNORMAL HIGH (ref 65–99)
Glucose-Capillary: 91 mg/dL (ref 65–99)

## 2015-04-11 DIAGNOSIS — D509 Iron deficiency anemia, unspecified: Secondary | ICD-10-CM | POA: Diagnosis not present

## 2015-04-11 DIAGNOSIS — L97524 Non-pressure chronic ulcer of other part of left foot with necrosis of bone: Secondary | ICD-10-CM | POA: Diagnosis not present

## 2015-04-11 DIAGNOSIS — N186 End stage renal disease: Secondary | ICD-10-CM | POA: Diagnosis not present

## 2015-04-11 DIAGNOSIS — E1129 Type 2 diabetes mellitus with other diabetic kidney complication: Secondary | ICD-10-CM | POA: Diagnosis not present

## 2015-04-11 DIAGNOSIS — I120 Hypertensive chronic kidney disease with stage 5 chronic kidney disease or end stage renal disease: Secondary | ICD-10-CM | POA: Diagnosis not present

## 2015-04-11 DIAGNOSIS — D631 Anemia in chronic kidney disease: Secondary | ICD-10-CM | POA: Diagnosis not present

## 2015-04-11 DIAGNOSIS — I70245 Atherosclerosis of native arteries of left leg with ulceration of other part of foot: Secondary | ICD-10-CM | POA: Diagnosis not present

## 2015-04-11 DIAGNOSIS — I70262 Atherosclerosis of native arteries of extremities with gangrene, left leg: Secondary | ICD-10-CM | POA: Diagnosis not present

## 2015-04-11 DIAGNOSIS — N2581 Secondary hyperparathyroidism of renal origin: Secondary | ICD-10-CM | POA: Diagnosis not present

## 2015-04-11 DIAGNOSIS — E11621 Type 2 diabetes mellitus with foot ulcer: Secondary | ICD-10-CM | POA: Diagnosis not present

## 2015-04-11 LAB — GLUCOSE, CAPILLARY
GLUCOSE-CAPILLARY: 77 mg/dL (ref 65–99)
Glucose-Capillary: 132 mg/dL — ABNORMAL HIGH (ref 65–99)

## 2015-04-12 DIAGNOSIS — N186 End stage renal disease: Secondary | ICD-10-CM | POA: Diagnosis not present

## 2015-04-12 DIAGNOSIS — L97522 Non-pressure chronic ulcer of other part of left foot with fat layer exposed: Secondary | ICD-10-CM | POA: Diagnosis not present

## 2015-04-12 DIAGNOSIS — E11621 Type 2 diabetes mellitus with foot ulcer: Secondary | ICD-10-CM | POA: Diagnosis not present

## 2015-04-12 DIAGNOSIS — I70245 Atherosclerosis of native arteries of left leg with ulceration of other part of foot: Secondary | ICD-10-CM | POA: Diagnosis not present

## 2015-04-12 DIAGNOSIS — D649 Anemia, unspecified: Secondary | ICD-10-CM | POA: Diagnosis not present

## 2015-04-12 DIAGNOSIS — I120 Hypertensive chronic kidney disease with stage 5 chronic kidney disease or end stage renal disease: Secondary | ICD-10-CM | POA: Diagnosis not present

## 2015-04-12 DIAGNOSIS — I70262 Atherosclerosis of native arteries of extremities with gangrene, left leg: Secondary | ICD-10-CM | POA: Diagnosis not present

## 2015-04-12 DIAGNOSIS — E1122 Type 2 diabetes mellitus with diabetic chronic kidney disease: Secondary | ICD-10-CM | POA: Diagnosis not present

## 2015-04-12 DIAGNOSIS — L97524 Non-pressure chronic ulcer of other part of left foot with necrosis of bone: Secondary | ICD-10-CM | POA: Diagnosis not present

## 2015-04-12 LAB — GLUCOSE, CAPILLARY
GLUCOSE-CAPILLARY: 197 mg/dL — AB (ref 65–99)
GLUCOSE-CAPILLARY: 110 mg/dL — AB (ref 65–99)

## 2015-04-13 DIAGNOSIS — E1129 Type 2 diabetes mellitus with other diabetic kidney complication: Secondary | ICD-10-CM | POA: Diagnosis not present

## 2015-04-13 DIAGNOSIS — N186 End stage renal disease: Secondary | ICD-10-CM | POA: Diagnosis not present

## 2015-04-13 DIAGNOSIS — D631 Anemia in chronic kidney disease: Secondary | ICD-10-CM | POA: Diagnosis not present

## 2015-04-13 DIAGNOSIS — D509 Iron deficiency anemia, unspecified: Secondary | ICD-10-CM | POA: Diagnosis not present

## 2015-04-13 DIAGNOSIS — N2581 Secondary hyperparathyroidism of renal origin: Secondary | ICD-10-CM | POA: Diagnosis not present

## 2015-04-15 ENCOUNTER — Encounter: Payer: Self-pay | Admitting: Surgery

## 2015-04-15 ENCOUNTER — Ambulatory Visit (INDEPENDENT_AMBULATORY_CARE_PROVIDER_SITE_OTHER): Payer: Medicare Other | Admitting: Surgery

## 2015-04-15 VITALS — BP 184/70 | HR 77 | Temp 98.6°F | Ht 68.0 in | Wt 130.0 lb

## 2015-04-15 DIAGNOSIS — D649 Anemia, unspecified: Secondary | ICD-10-CM | POA: Diagnosis not present

## 2015-04-15 DIAGNOSIS — I739 Peripheral vascular disease, unspecified: Secondary | ICD-10-CM

## 2015-04-15 DIAGNOSIS — L98499 Non-pressure chronic ulcer of skin of other sites with unspecified severity: Secondary | ICD-10-CM | POA: Diagnosis not present

## 2015-04-15 DIAGNOSIS — L97522 Non-pressure chronic ulcer of other part of left foot with fat layer exposed: Secondary | ICD-10-CM | POA: Diagnosis not present

## 2015-04-15 DIAGNOSIS — E11621 Type 2 diabetes mellitus with foot ulcer: Secondary | ICD-10-CM | POA: Diagnosis not present

## 2015-04-15 DIAGNOSIS — E1122 Type 2 diabetes mellitus with diabetic chronic kidney disease: Secondary | ICD-10-CM | POA: Diagnosis not present

## 2015-04-15 DIAGNOSIS — N186 End stage renal disease: Secondary | ICD-10-CM | POA: Diagnosis not present

## 2015-04-15 DIAGNOSIS — I70245 Atherosclerosis of native arteries of left leg with ulceration of other part of foot: Secondary | ICD-10-CM | POA: Diagnosis not present

## 2015-04-15 DIAGNOSIS — L97524 Non-pressure chronic ulcer of other part of left foot with necrosis of bone: Secondary | ICD-10-CM | POA: Diagnosis not present

## 2015-04-15 DIAGNOSIS — I70209 Unspecified atherosclerosis of native arteries of extremities, unspecified extremity: Secondary | ICD-10-CM

## 2015-04-15 DIAGNOSIS — I120 Hypertensive chronic kidney disease with stage 5 chronic kidney disease or end stage renal disease: Secondary | ICD-10-CM | POA: Diagnosis not present

## 2015-04-15 DIAGNOSIS — I70262 Atherosclerosis of native arteries of extremities with gangrene, left leg: Secondary | ICD-10-CM | POA: Diagnosis not present

## 2015-04-15 LAB — GLUCOSE, CAPILLARY
Glucose-Capillary: 172 mg/dL — ABNORMAL HIGH (ref 65–99)
Glucose-Capillary: 130 mg/dL — ABNORMAL HIGH (ref 65–99)

## 2015-04-15 NOTE — Progress Notes (Signed)
Patient name: Jackie [name] MRN: [id number] DOB: 1935-03-12 Sex: female     Chief Complaint  Patient presents with  . Re-evaluation    6 wk f/u     HISTORY OF PRESENT ILLNESS: When I initially saw her I was concerned that she was  Going to require bilateral lower extremity amputations because of the amount of tissue loss secondary to her ulcers.. She underwent angiography by Dr. Imogene Burn and attempted recannulization of a right tibial vessel which was unsuccessful. She was sent for wound care. Initially I was very pleased with the progress that she made and therefore elected to continue with wound care. She is now doing hyperbarics. Her right foot wound has healed.  Her left foot continued to have an open area on the dorsum as well as ischemic changes to the first 3 toes  And part of the fourth left toe.  Therefore on 03/19/2015 she underwent angiography.  I found 2 lesions within the left leg which were treated with atherectomy and angioplasty followed by stenting.   She is here today stating that she is tired of that he had a foot and wants to have her toes removed.  She denies fevers or chills.  She continues to go to the wound care for hyperbarics and wound care.  Past Medical History  Diagnosis Date  . Hypertension   . Diabetes mellitus   . Thyroid disease   . Anemia   . Hyperlipidemia   . Arthritis   . End-stage renal disease on hemodialysis     Past Surgical History  Procedure Laterality Date  . Arteriovenous graft placement Left     non function  . Eye surgery Bilateral     catarct  . Cesarean section    . Av fistula placement Right 01/30/2013    Procedure: ARTERIOVENOUS (AV) FISTULA CREATION;  Surgeon: Fransisco Hertz, MD;  Location: [facility] OR;  Service: Vascular;  Laterality: Right;  . Insertion of dialysis catheter Right 04/24/2013    Procedure: INSERTION OF DIALYSIS CATHETER;  Surgeon: Larina Earthly, MD;  Location: [facility] OR;  Service: Vascular;  Laterality: Right;  .  Fistula superficialization Right 04/24/2013    Procedure: FISTULA SUPERFICIALIZATION;  Surgeon: Larina Earthly, MD;  Location: [facility] OR;  Service: Vascular;  Laterality: Right;  . Fistulogram Right 07/26/2013    Procedure: FISTULOGRAM;  Surgeon: Larina Earthly, MD;  Location: [facility] CATH LAB;  Service: Cardiovascular;  Laterality: Right;  . Peripheral vascular catheterization N/A 12/05/2014    Procedure: Abdominal Aortogram;  Surgeon: Fransisco Hertz, MD;  Location: [facility] INVASIVE CV LAB;  Service: Cardiovascular;  Laterality: N/A;  . Peripheral vascular catheterization N/A 03/19/2015    Procedure: Lower Extremity Angiography;  Surgeon: Nada Libman, MD;  Location: [facility] INVASIVE CV LAB;  Service: Cardiovascular;  Laterality: N/A;  . Peripheral vascular catheterization  03/19/2015    Procedure: Peripheral Vascular Intervention;  Surgeon: Nada Libman, MD;  Location: [facility] INVASIVE CV LAB;  Service: Cardiovascular;;  left sfa and popliteal stent    Social History   Social History  . Marital Status: Widowed    Spouse Name: N/A  . Number of Children: N/A  . Years of Education: N/A   Occupational History  . Not on file.   Social History Main Topics  . Smoking status: Never Smoker   . Smokeless tobacco: Never Used  . Alcohol Use: No  . Drug Use: No  . Sexual Activity: Not on file  Other Topics Concern  . Not on file   Social History Narrative    Family History  Problem Relation Age of Onset  . Diabetes Mother   . Heart disease Mother   . Hypertension Mother     Allergies as of 04/15/2015  . (No Known Allergies)    Current Outpatient Prescriptions on File Prior to Visit  Medication Sig Dispense Refill  . albuterol (PROVENTIL HFA;VENTOLIN HFA) 108 (90 BASE) MCG/ACT inhaler Inhale 1-2 puffs into the lungs every 6 (six) hours as needed for wheezing. 1 Inhaler 0  . B Complex-C-Folic Acid (B COMPLEX-VITAMIN C-FOLIC ACID) 1 MG tablet Take 1 tablet by mouth daily with breakfast.    . calcitRIOL  (ROCALTROL) 0.5 MCG capsule Take 2 capsules (1 mcg total) by mouth Every Tuesday,Thursday,and Saturday with dialysis. 30 capsule 0  . calcium acetate (PHOSLO) 667 MG capsule Take 2 capsules by mouth 3 (three) times daily with meals.    . cefTAZidime 2 g in dextrose 5 % 50 mL Inject 2 g into the vein Every Tuesday,Thursday,and Saturday with dialysis.    Marland Kitchen FOSRENOL 1000 MG chewable tablet Chew 2,000 mg by mouth 3 (three) times daily with meals.     . insulin aspart (NOVOLOG) 100 UNIT/ML injection Inject 5 Units into the skin daily as needed for high blood sugar.     . insulin detemir (LEVEMIR) 100 UNIT/ML injection Inject 0.1 mLs (10 Units total) into the skin at bedtime. (Patient taking differently: Inject 15 Units into the skin at bedtime. ) 10 mL 11  . IRON PO Take 1 tablet by mouth daily.    Marland Kitchen oxyCODONE (ROXICODONE) 5 MG immediate release tablet Take 1 tablet (5 mg total) by mouth every 4 (four) hours as needed for moderate pain. 15 tablet 0  . pregabalin (LYRICA) 50 MG capsule Take 50 mg by mouth 3 (three) times daily as needed. For nerve pain in feet per patient    . saccharomyces boulardii (FLORASTOR) 250 MG capsule Take 1 capsule (250 mg total) by mouth 2 (two) times daily. (Patient not taking: Reported on 03/19/2015) 30 capsule 0  . silver sulfADIAZINE (SILVADENE) 1 % cream Apply topically daily. Apply Silvadene to bilat foot wounds Q day, then cover with gauze and kerlex.  Remove all previous Silvadene before re-applying more each time. (Patient not taking: Reported on 03/19/2015) 50 g 0  . vancomycin 500 mg in sodium chloride 0.9 % 100 mL Inject 500 mg into the vein Every Tuesday,Thursday,and Saturday with dialysis.     No current facility-administered medications on file prior to visit.     REVIEW OF SYSTEMS:  see history of present illness, otherwise no changes from her visit several weeks ago.  PHYSICAL EXAMINATION:   Vital signs are  Filed Vitals:   04/15/15 1607 04/15/15 1610  BP:  174/74 184/70  Pulse: 77   Temp: 98.6 F (37 C)   TempSrc: Oral   Height:  (1.727 m)   Weight: 130 lb (58.968 kg)   SpO2: 96%    Body mass index is 19.77 kg/(m^2). General: The patient appears their stated age. HEENT:  No gross abnormalities Pulmonary:  Non labored breathing Musculoskeletal: There are no major deformities. Neurologic: No focal weakness or paresthesias are detected, Skin:  Necrotic toes on the left 1 through 3 with early ischemic changes to the medial side of the fourth toe. There is also dry eschar on the plantar surface of the foot and a open wound with pink granulation  tissue on the dorsum extending above the ankle Psychiatric: The patient has normal affect. Cardiovascular: There is a regular rate and rhythm without significant murmur appreciated.   Diagnostic Studies  none  Assessment:  atherosclerosis with bilateral ulcers.  The  Right side has healed the left persists with ischemic changes Plan:  given that the patient wants to have her toes amputated currently , I have asked Dr. Lajoyce Corners to evaluate her to see if there is any way the foot can be salvaged or whether or not she needs a below knee dictation.  He has graciously  Been able to work her into his office this week.  I will defer definitive treatment at limb salvage to him  V. Charlena Cross, M.D. [facility] Office: [phone] Pager:  [phone]

## 2015-04-16 DIAGNOSIS — E11621 Type 2 diabetes mellitus with foot ulcer: Secondary | ICD-10-CM | POA: Diagnosis not present

## 2015-04-16 DIAGNOSIS — I120 Hypertensive chronic kidney disease with stage 5 chronic kidney disease or end stage renal disease: Secondary | ICD-10-CM | POA: Diagnosis not present

## 2015-04-16 DIAGNOSIS — I70262 Atherosclerosis of native arteries of extremities with gangrene, left leg: Secondary | ICD-10-CM | POA: Diagnosis not present

## 2015-04-16 DIAGNOSIS — N186 End stage renal disease: Secondary | ICD-10-CM | POA: Diagnosis not present

## 2015-04-16 DIAGNOSIS — E1129 Type 2 diabetes mellitus with other diabetic kidney complication: Secondary | ICD-10-CM | POA: Diagnosis not present

## 2015-04-16 DIAGNOSIS — I70245 Atherosclerosis of native arteries of left leg with ulceration of other part of foot: Secondary | ICD-10-CM | POA: Diagnosis not present

## 2015-04-16 DIAGNOSIS — D509 Iron deficiency anemia, unspecified: Secondary | ICD-10-CM | POA: Diagnosis not present

## 2015-04-16 DIAGNOSIS — N2581 Secondary hyperparathyroidism of renal origin: Secondary | ICD-10-CM | POA: Diagnosis not present

## 2015-04-16 DIAGNOSIS — D631 Anemia in chronic kidney disease: Secondary | ICD-10-CM | POA: Diagnosis not present

## 2015-04-16 DIAGNOSIS — L97524 Non-pressure chronic ulcer of other part of left foot with necrosis of bone: Secondary | ICD-10-CM | POA: Diagnosis not present

## 2015-04-16 LAB — GLUCOSE, CAPILLARY
GLUCOSE-CAPILLARY: 103 mg/dL — AB (ref 65–99)
Glucose-Capillary: 183 mg/dL — ABNORMAL HIGH (ref 65–99)

## 2015-04-17 DIAGNOSIS — N186 End stage renal disease: Secondary | ICD-10-CM | POA: Diagnosis not present

## 2015-04-17 DIAGNOSIS — E1152 Type 2 diabetes mellitus with diabetic peripheral angiopathy with gangrene: Secondary | ICD-10-CM | POA: Diagnosis not present

## 2015-04-17 DIAGNOSIS — E1142 Type 2 diabetes mellitus with diabetic polyneuropathy: Secondary | ICD-10-CM | POA: Diagnosis not present

## 2015-04-17 DIAGNOSIS — I70244 Atherosclerosis of native arteries of left leg with ulceration of heel and midfoot: Secondary | ICD-10-CM | POA: Diagnosis not present

## 2015-04-17 DIAGNOSIS — I70245 Atherosclerosis of native arteries of left leg with ulceration of other part of foot: Secondary | ICD-10-CM | POA: Diagnosis not present

## 2015-04-17 DIAGNOSIS — L97512 Non-pressure chronic ulcer of other part of right foot with fat layer exposed: Secondary | ICD-10-CM | POA: Diagnosis not present

## 2015-04-17 DIAGNOSIS — I70262 Atherosclerosis of native arteries of extremities with gangrene, left leg: Secondary | ICD-10-CM | POA: Diagnosis not present

## 2015-04-17 DIAGNOSIS — E11621 Type 2 diabetes mellitus with foot ulcer: Secondary | ICD-10-CM | POA: Diagnosis not present

## 2015-04-17 DIAGNOSIS — L97524 Non-pressure chronic ulcer of other part of left foot with necrosis of bone: Secondary | ICD-10-CM | POA: Diagnosis not present

## 2015-04-17 DIAGNOSIS — I120 Hypertensive chronic kidney disease with stage 5 chronic kidney disease or end stage renal disease: Secondary | ICD-10-CM | POA: Diagnosis not present

## 2015-04-17 LAB — GLUCOSE, CAPILLARY
GLUCOSE-CAPILLARY: 227 mg/dL — AB (ref 65–99)
GLUCOSE-CAPILLARY: 80 mg/dL (ref 65–99)

## 2015-04-18 ENCOUNTER — Other Ambulatory Visit (HOSPITAL_COMMUNITY): Payer: Self-pay | Admitting: Orthopedic Surgery

## 2015-04-18 DIAGNOSIS — N2581 Secondary hyperparathyroidism of renal origin: Secondary | ICD-10-CM | POA: Diagnosis not present

## 2015-04-18 DIAGNOSIS — I70245 Atherosclerosis of native arteries of left leg with ulceration of other part of foot: Secondary | ICD-10-CM | POA: Diagnosis not present

## 2015-04-18 DIAGNOSIS — E11621 Type 2 diabetes mellitus with foot ulcer: Secondary | ICD-10-CM | POA: Diagnosis not present

## 2015-04-18 DIAGNOSIS — D631 Anemia in chronic kidney disease: Secondary | ICD-10-CM | POA: Diagnosis not present

## 2015-04-18 DIAGNOSIS — I70262 Atherosclerosis of native arteries of extremities with gangrene, left leg: Secondary | ICD-10-CM | POA: Diagnosis not present

## 2015-04-18 DIAGNOSIS — D509 Iron deficiency anemia, unspecified: Secondary | ICD-10-CM | POA: Diagnosis not present

## 2015-04-18 DIAGNOSIS — N186 End stage renal disease: Secondary | ICD-10-CM | POA: Diagnosis not present

## 2015-04-18 DIAGNOSIS — I120 Hypertensive chronic kidney disease with stage 5 chronic kidney disease or end stage renal disease: Secondary | ICD-10-CM | POA: Diagnosis not present

## 2015-04-18 DIAGNOSIS — E1129 Type 2 diabetes mellitus with other diabetic kidney complication: Secondary | ICD-10-CM | POA: Diagnosis not present

## 2015-04-18 DIAGNOSIS — L97524 Non-pressure chronic ulcer of other part of left foot with necrosis of bone: Secondary | ICD-10-CM | POA: Diagnosis not present

## 2015-04-18 LAB — GLUCOSE, CAPILLARY
GLUCOSE-CAPILLARY: 195 mg/dL — AB (ref 65–99)
GLUCOSE-CAPILLARY: 89 mg/dL (ref 65–99)

## 2015-04-19 DIAGNOSIS — Z95820 Peripheral vascular angioplasty status with implants and grafts: Secondary | ICD-10-CM | POA: Diagnosis not present

## 2015-04-19 DIAGNOSIS — I70262 Atherosclerosis of native arteries of extremities with gangrene, left leg: Secondary | ICD-10-CM | POA: Diagnosis not present

## 2015-04-19 DIAGNOSIS — I120 Hypertensive chronic kidney disease with stage 5 chronic kidney disease or end stage renal disease: Secondary | ICD-10-CM | POA: Diagnosis not present

## 2015-04-19 DIAGNOSIS — Z992 Dependence on renal dialysis: Secondary | ICD-10-CM | POA: Diagnosis not present

## 2015-04-19 DIAGNOSIS — L97524 Non-pressure chronic ulcer of other part of left foot with necrosis of bone: Secondary | ICD-10-CM | POA: Diagnosis not present

## 2015-04-19 DIAGNOSIS — E1129 Type 2 diabetes mellitus with other diabetic kidney complication: Secondary | ICD-10-CM | POA: Diagnosis not present

## 2015-04-19 DIAGNOSIS — E1140 Type 2 diabetes mellitus with diabetic neuropathy, unspecified: Secondary | ICD-10-CM | POA: Diagnosis not present

## 2015-04-19 DIAGNOSIS — E11621 Type 2 diabetes mellitus with foot ulcer: Secondary | ICD-10-CM | POA: Diagnosis not present

## 2015-04-19 DIAGNOSIS — L97522 Non-pressure chronic ulcer of other part of left foot with fat layer exposed: Secondary | ICD-10-CM | POA: Diagnosis not present

## 2015-04-19 DIAGNOSIS — D649 Anemia, unspecified: Secondary | ICD-10-CM | POA: Diagnosis not present

## 2015-04-19 DIAGNOSIS — N186 End stage renal disease: Secondary | ICD-10-CM | POA: Diagnosis not present

## 2015-04-19 DIAGNOSIS — I70245 Atherosclerosis of native arteries of left leg with ulceration of other part of foot: Secondary | ICD-10-CM | POA: Diagnosis not present

## 2015-04-19 DIAGNOSIS — E1152 Type 2 diabetes mellitus with diabetic peripheral angiopathy with gangrene: Secondary | ICD-10-CM | POA: Diagnosis not present

## 2015-04-19 DIAGNOSIS — E1122 Type 2 diabetes mellitus with diabetic chronic kidney disease: Secondary | ICD-10-CM | POA: Diagnosis not present

## 2015-04-19 LAB — GLUCOSE, CAPILLARY
GLUCOSE-CAPILLARY: 86 mg/dL (ref 65–99)
Glucose-Capillary: 143 mg/dL — ABNORMAL HIGH (ref 65–99)

## 2015-04-20 DIAGNOSIS — N186 End stage renal disease: Secondary | ICD-10-CM | POA: Diagnosis not present

## 2015-04-20 DIAGNOSIS — N2581 Secondary hyperparathyroidism of renal origin: Secondary | ICD-10-CM | POA: Diagnosis not present

## 2015-04-20 DIAGNOSIS — E1129 Type 2 diabetes mellitus with other diabetic kidney complication: Secondary | ICD-10-CM | POA: Diagnosis not present

## 2015-04-20 DIAGNOSIS — D509 Iron deficiency anemia, unspecified: Secondary | ICD-10-CM | POA: Diagnosis not present

## 2015-04-20 DIAGNOSIS — D631 Anemia in chronic kidney disease: Secondary | ICD-10-CM | POA: Diagnosis not present

## 2015-04-22 ENCOUNTER — Encounter (HOSPITAL_BASED_OUTPATIENT_CLINIC_OR_DEPARTMENT_OTHER): Payer: Medicare Other | Attending: Surgery

## 2015-04-22 DIAGNOSIS — E1122 Type 2 diabetes mellitus with diabetic chronic kidney disease: Secondary | ICD-10-CM | POA: Diagnosis not present

## 2015-04-22 DIAGNOSIS — E11622 Type 2 diabetes mellitus with other skin ulcer: Secondary | ICD-10-CM | POA: Diagnosis not present

## 2015-04-22 DIAGNOSIS — N186 End stage renal disease: Secondary | ICD-10-CM | POA: Diagnosis not present

## 2015-04-22 DIAGNOSIS — E11621 Type 2 diabetes mellitus with foot ulcer: Secondary | ICD-10-CM | POA: Diagnosis not present

## 2015-04-22 DIAGNOSIS — L97909 Non-pressure chronic ulcer of unspecified part of unspecified lower leg with unspecified severity: Secondary | ICD-10-CM | POA: Diagnosis not present

## 2015-04-22 DIAGNOSIS — D649 Anemia, unspecified: Secondary | ICD-10-CM | POA: Diagnosis not present

## 2015-04-22 DIAGNOSIS — L97522 Non-pressure chronic ulcer of other part of left foot with fat layer exposed: Secondary | ICD-10-CM | POA: Diagnosis not present

## 2015-04-22 DIAGNOSIS — I120 Hypertensive chronic kidney disease with stage 5 chronic kidney disease or end stage renal disease: Secondary | ICD-10-CM | POA: Diagnosis not present

## 2015-04-22 LAB — GLUCOSE, CAPILLARY
GLUCOSE-CAPILLARY: 128 mg/dL — AB (ref 65–99)
Glucose-Capillary: 181 mg/dL — ABNORMAL HIGH (ref 65–99)

## 2015-04-23 ENCOUNTER — Encounter (HOSPITAL_COMMUNITY): Payer: Self-pay | Admitting: *Deleted

## 2015-04-23 DIAGNOSIS — E11622 Type 2 diabetes mellitus with other skin ulcer: Secondary | ICD-10-CM | POA: Diagnosis not present

## 2015-04-23 DIAGNOSIS — N2581 Secondary hyperparathyroidism of renal origin: Secondary | ICD-10-CM | POA: Diagnosis not present

## 2015-04-23 DIAGNOSIS — E1129 Type 2 diabetes mellitus with other diabetic kidney complication: Secondary | ICD-10-CM | POA: Diagnosis not present

## 2015-04-23 DIAGNOSIS — L97909 Non-pressure chronic ulcer of unspecified part of unspecified lower leg with unspecified severity: Secondary | ICD-10-CM | POA: Diagnosis not present

## 2015-04-23 DIAGNOSIS — D509 Iron deficiency anemia, unspecified: Secondary | ICD-10-CM | POA: Diagnosis not present

## 2015-04-23 DIAGNOSIS — N186 End stage renal disease: Secondary | ICD-10-CM | POA: Diagnosis not present

## 2015-04-23 DIAGNOSIS — D631 Anemia in chronic kidney disease: Secondary | ICD-10-CM | POA: Diagnosis not present

## 2015-04-23 LAB — GLUCOSE, CAPILLARY
GLUCOSE-CAPILLARY: 59 mg/dL — AB (ref 65–99)
GLUCOSE-CAPILLARY: 59 mg/dL — AB (ref 65–99)
GLUCOSE-CAPILLARY: 65 mg/dL (ref 65–99)
GLUCOSE-CAPILLARY: 65 mg/dL (ref 65–99)
Glucose-Capillary: 118 mg/dL — ABNORMAL HIGH (ref 65–99)

## 2015-04-23 MED ORDER — CHLORHEXIDINE GLUCONATE 4 % EX LIQD: 60.0000 mL | Freq: Once | CUTANEOUS | Status: DC

## 2015-04-23 MED ORDER — CEFAZOLIN SODIUM-DEXTROSE 2-3 GM-% IV SOLR
2.0000 g | INTRAVENOUS | Status: AC
  Administered 2015-04-24: 2 g via INTRAVENOUS
  Filled 2015-04-23: qty 50

## 2015-04-23 NOTE — Progress Notes (Signed)
Pt denies cardiac history, chest pain or sob. 

## 2015-04-23 NOTE — Anesthesia Preprocedure Evaluation (Addendum)
Anesthesia Evaluation  Patient identified by MRN, date of birth, ID band Patient awake    Reviewed: Allergy & Precautions, NPO status , Patient's Chart, lab work & pertinent test results, reviewed documented beta blocker date and time   Airway Mallampati: II       Dental  (+) Edentulous Lower, Edentulous Upper, Dental Advisory Given   Pulmonary asthma ,    breath sounds clear to auscultation       Cardiovascular hypertension, Pt. on medications  Rhythm:Regular  ECHO 2014 EF 55%, grade I diastolic dysfun   Neuro/Psych Carotids <39% stenosis bilat    GI/Hepatic   Endo/Other  diabetes, Type 2, Insulin Dependent  Renal/GU DialysisRenal disease     Musculoskeletal  (+) Arthritis ,   Abdominal (+)  Abdomen: soft.    Peds  Hematology   Anesthesia Other Findings   Reproductive/Obstetrics                           Anesthesia Physical Anesthesia Plan  ASA: III  Anesthesia Plan: General   Post-op Pain Management:    Induction: Intravenous  Airway Management Planned: LMA and Oral ETT  Additional Equipment:   Intra-op Plan:   Post-operative Plan:   Informed Consent: I have reviewed the patients History and Physical, chart, labs and discussed the procedure including the risks, benefits and alternatives for the proposed anesthesia with the patient or authorized representative who has indicated his/her understanding and acceptance.     Plan Discussed with:   Anesthesia Plan Comments: (Check AM labs)        Anesthesia Quick Evaluation

## 2015-04-24 ENCOUNTER — Inpatient Hospital Stay (HOSPITAL_COMMUNITY): Payer: Medicare Other | Admitting: Anesthesiology

## 2015-04-24 ENCOUNTER — Inpatient Hospital Stay (HOSPITAL_COMMUNITY): Payer: Medicare Other

## 2015-04-24 ENCOUNTER — Encounter (HOSPITAL_COMMUNITY)
Admission: RE | Disposition: A | Discharge status: Skilled Nursing Facility | Payer: Self-pay | Source: Ambulatory Visit | Attending: Orthopedic Surgery

## 2015-04-24 ENCOUNTER — Inpatient Hospital Stay (HOSPITAL_COMMUNITY)
Admission: RE | Admit: 2015-04-24 | Discharge: 2015-04-28 | DRG: 239 | Disposition: A | Discharge status: Skilled Nursing Facility | Payer: Medicare Other | Source: Ambulatory Visit | Attending: Orthopedic Surgery | Admitting: Orthopedic Surgery

## 2015-04-24 ENCOUNTER — Encounter (HOSPITAL_COMMUNITY): Payer: Self-pay | Admitting: Critical Care Medicine

## 2015-04-24 DIAGNOSIS — E1129 Type 2 diabetes mellitus with other diabetic kidney complication: Secondary | ICD-10-CM | POA: Diagnosis not present

## 2015-04-24 DIAGNOSIS — M898X9 Other specified disorders of bone, unspecified site: Secondary | ICD-10-CM | POA: Diagnosis present

## 2015-04-24 DIAGNOSIS — I120 Hypertensive chronic kidney disease with stage 5 chronic kidney disease or end stage renal disease: Secondary | ICD-10-CM | POA: Diagnosis present

## 2015-04-24 DIAGNOSIS — D638 Anemia in other chronic diseases classified elsewhere: Secondary | ICD-10-CM | POA: Diagnosis present

## 2015-04-24 DIAGNOSIS — E785 Hyperlipidemia, unspecified: Secondary | ICD-10-CM | POA: Diagnosis not present

## 2015-04-24 DIAGNOSIS — E1142 Type 2 diabetes mellitus with diabetic polyneuropathy: Secondary | ICD-10-CM | POA: Diagnosis not present

## 2015-04-24 DIAGNOSIS — Z89512 Acquired absence of left leg below knee: Secondary | ICD-10-CM | POA: Diagnosis not present

## 2015-04-24 DIAGNOSIS — L97529 Non-pressure chronic ulcer of other part of left foot with unspecified severity: Secondary | ICD-10-CM | POA: Diagnosis present

## 2015-04-24 DIAGNOSIS — Z992 Dependence on renal dialysis: Secondary | ICD-10-CM

## 2015-04-24 DIAGNOSIS — M1990 Unspecified osteoarthritis, unspecified site: Secondary | ICD-10-CM | POA: Diagnosis present

## 2015-04-24 DIAGNOSIS — M6281 Muscle weakness (generalized): Secondary | ICD-10-CM | POA: Diagnosis not present

## 2015-04-24 DIAGNOSIS — Z833 Family history of diabetes mellitus: Secondary | ICD-10-CM | POA: Diagnosis not present

## 2015-04-24 DIAGNOSIS — Z79899 Other long term (current) drug therapy: Secondary | ICD-10-CM | POA: Diagnosis not present

## 2015-04-24 DIAGNOSIS — D649 Anemia, unspecified: Secondary | ICD-10-CM | POA: Diagnosis not present

## 2015-04-24 DIAGNOSIS — E1122 Type 2 diabetes mellitus with diabetic chronic kidney disease: Secondary | ICD-10-CM | POA: Diagnosis present

## 2015-04-24 DIAGNOSIS — L97929 Non-pressure chronic ulcer of unspecified part of left lower leg with unspecified severity: Secondary | ICD-10-CM | POA: Diagnosis not present

## 2015-04-24 DIAGNOSIS — Z89519 Acquired absence of unspecified leg below knee: Secondary | ICD-10-CM

## 2015-04-24 DIAGNOSIS — E039 Hypothyroidism, unspecified: Secondary | ICD-10-CM | POA: Diagnosis not present

## 2015-04-24 DIAGNOSIS — E119 Type 2 diabetes mellitus without complications: Secondary | ICD-10-CM | POA: Diagnosis not present

## 2015-04-24 DIAGNOSIS — E1152 Type 2 diabetes mellitus with diabetic peripheral angiopathy with gangrene: Principal | ICD-10-CM | POA: Diagnosis present

## 2015-04-24 DIAGNOSIS — N2581 Secondary hyperparathyroidism of renal origin: Secondary | ICD-10-CM | POA: Diagnosis present

## 2015-04-24 DIAGNOSIS — I10 Essential (primary) hypertension: Secondary | ICD-10-CM | POA: Diagnosis not present

## 2015-04-24 DIAGNOSIS — I96 Gangrene, not elsewhere classified: Secondary | ICD-10-CM | POA: Diagnosis not present

## 2015-04-24 DIAGNOSIS — Z792 Long term (current) use of antibiotics: Secondary | ICD-10-CM | POA: Diagnosis not present

## 2015-04-24 DIAGNOSIS — I70262 Atherosclerosis of native arteries of extremities with gangrene, left leg: Secondary | ICD-10-CM | POA: Diagnosis not present

## 2015-04-24 DIAGNOSIS — N186 End stage renal disease: Secondary | ICD-10-CM | POA: Diagnosis present

## 2015-04-24 DIAGNOSIS — I70244 Atherosclerosis of native arteries of left leg with ulceration of heel and midfoot: Secondary | ICD-10-CM | POA: Diagnosis not present

## 2015-04-24 DIAGNOSIS — M25569 Pain in unspecified knee: Secondary | ICD-10-CM | POA: Diagnosis not present

## 2015-04-24 DIAGNOSIS — Z5189 Encounter for other specified aftercare: Secondary | ICD-10-CM | POA: Diagnosis not present

## 2015-04-24 DIAGNOSIS — M79605 Pain in left leg: Secondary | ICD-10-CM | POA: Diagnosis not present

## 2015-04-24 DIAGNOSIS — Z794 Long term (current) use of insulin: Secondary | ICD-10-CM

## 2015-04-24 DIAGNOSIS — I739 Peripheral vascular disease, unspecified: Secondary | ICD-10-CM | POA: Diagnosis not present

## 2015-04-24 HISTORY — PX: AMPUTATION: SHX166

## 2015-04-24 HISTORY — DX: Pneumonia, unspecified organism: J18.9

## 2015-04-24 LAB — COMPREHENSIVE METABOLIC PANEL
ALT: 9 U/L — ABNORMAL LOW (ref 14–54)
AST: 20 U/L (ref 15–41)
Albumin: 2.3 g/dL — ABNORMAL LOW (ref 3.5–5.0)
Alkaline Phosphatase: 63 U/L (ref 38–126)
Anion gap: 11 (ref 5–15)
BUN: 13 mg/dL (ref 6–20)
CHLORIDE: 97 mmol/L — AB (ref 101–111)
CO2: 31 mmol/L (ref 22–32)
Calcium: 8 mg/dL — ABNORMAL LOW (ref 8.9–10.3)
Creatinine, Ser: 3.66 mg/dL — ABNORMAL HIGH (ref 0.44–1.00)
GFR, EST AFRICAN AMERICAN: 13 mL/min — AB (ref >60)
GFR, EST NON AFRICAN AMERICAN: 11 mL/min — AB (ref >60)
Glucose, Bld: 88 mg/dL (ref 65–99)
POTASSIUM: 3.2 mmol/L — AB (ref 3.5–5.1)
SODIUM: 139 mmol/L (ref 135–145)
Total Bilirubin: 0.4 mg/dL (ref 0.3–1.2)
Total Protein: 6.7 g/dL (ref 6.5–8.1)

## 2015-04-24 LAB — CBC
HCT: 32.1 % — ABNORMAL LOW (ref 36.0–46.0)
Hemoglobin: 9.2 g/dL — ABNORMAL LOW (ref 12.0–15.0)
MCH: 24.9 pg — AB (ref 26.0–34.0)
MCHC: 28.7 g/dL — ABNORMAL LOW (ref 30.0–36.0)
MCV: 86.8 fL (ref 78.0–100.0)
PLATELETS: 163 10*3/uL (ref 150–400)
RBC: 3.7 MIL/uL — ABNORMAL LOW (ref 3.87–5.11)
RDW: 19.6 % — AB (ref 11.5–15.5)
WBC: 5.1 10*3/uL (ref 4.0–10.5)

## 2015-04-24 LAB — GLUCOSE, CAPILLARY
GLUCOSE-CAPILLARY: 93 mg/dL (ref 65–99)
GLUCOSE-CAPILLARY: 91 mg/dL (ref 65–99)
GLUCOSE-CAPILLARY: 120 mg/dL — AB (ref 65–99)
GLUCOSE-CAPILLARY: 116 mg/dL — AB (ref 65–99)
Glucose-Capillary: 90 mg/dL (ref 65–99)

## 2015-04-24 LAB — APTT: aPTT: 34 seconds (ref 24–37)

## 2015-04-24 LAB — PROTIME-INR
INR: 1.12 (ref 0.00–1.49)
PROTHROMBIN TIME: 14.6 s (ref 11.6–15.2)

## 2015-04-24 SURGERY — AMPUTATION BELOW KNEE
Anesthesia: General | Site: Leg Lower | Laterality: Left

## 2015-04-24 MED ORDER — LANTHANUM CARBONATE 500 MG PO CHEW
2000.0000 mg | CHEWABLE_TABLET | Freq: Three times a day (TID) | ORAL | Status: DC
  Administered 2015-04-24 – 2015-04-27 (×8): 2000 mg via ORAL
  Filled 2015-04-24 (×8): qty 4

## 2015-04-24 MED ORDER — ONDANSETRON HCL 4 MG PO TABS: 4.0000 mg | ORAL_TABLET | Freq: Four times a day (QID) | ORAL | Status: DC | PRN

## 2015-04-24 MED ORDER — INSULIN ASPART 100 UNIT/ML ~~LOC~~ SOLN
3.0000 [IU] | Freq: Three times a day (TID) | SUBCUTANEOUS | Status: DC
  Administered 2015-04-27: 3 [IU] via SUBCUTANEOUS

## 2015-04-24 MED ORDER — ONDANSETRON HCL 4 MG/2ML IJ SOLN
4.0000 mg | Freq: Four times a day (QID) | INTRAMUSCULAR | Status: DC | PRN
  Administered 2015-04-24 – 2015-04-28 (×2): 4 mg via INTRAVENOUS
  Filled 2015-04-24 (×2): qty 2

## 2015-04-24 MED ORDER — INSULIN ASPART 100 UNIT/ML ~~LOC~~ SOLN
0.0000 [IU] | Freq: Three times a day (TID) | SUBCUTANEOUS | Status: DC
  Administered 2015-04-27: 1 [IU] via SUBCUTANEOUS

## 2015-04-24 MED ORDER — INFLUENZA VAC SPLIT QUAD 0.5 ML IM SUSY
0.5000 mL | PREFILLED_SYRINGE | INTRAMUSCULAR | Status: AC
  Administered 2015-04-27: 0.5 mL via INTRAMUSCULAR
  Filled 2015-04-24: qty 0.5

## 2015-04-24 MED ORDER — FENTANYL CITRATE (PF) 250 MCG/5ML IJ SOLN
INTRAMUSCULAR | Status: AC
  Filled 2015-04-24: qty 5

## 2015-04-24 MED ORDER — ALBUTEROL SULFATE (2.5 MG/3ML) 0.083% IN NEBU: 1.0000 mL | INHALATION_SOLUTION | Freq: Four times a day (QID) | RESPIRATORY_TRACT | Status: DC | PRN

## 2015-04-24 MED ORDER — MIDAZOLAM HCL 5 MG/5ML IJ SOLN
INTRAMUSCULAR | Status: DC | PRN
  Administered 2015-04-24: 2 mg via INTRAVENOUS

## 2015-04-24 MED ORDER — ACETAMINOPHEN 325 MG PO TABS: 650.0000 mg | ORAL_TABLET | Freq: Four times a day (QID) | ORAL | Status: DC | PRN

## 2015-04-24 MED ORDER — HYDROMORPHONE HCL 1 MG/ML IJ SOLN
0.2500 mg | INTRAMUSCULAR | Status: DC | PRN
  Administered 2015-04-24: 0.25 mg via INTRAVENOUS

## 2015-04-24 MED ORDER — EPHEDRINE SULFATE 50 MG/ML IJ SOLN
INTRAMUSCULAR | Status: AC
  Filled 2015-04-24: qty 1

## 2015-04-24 MED ORDER — METHOCARBAMOL 500 MG PO TABS
500.0000 mg | ORAL_TABLET | Freq: Four times a day (QID) | ORAL | Status: DC | PRN
  Administered 2015-04-25 – 2015-04-27 (×5): 500 mg via ORAL
  Filled 2015-04-24 (×5): qty 1

## 2015-04-24 MED ORDER — SODIUM CHLORIDE 0.9 % IV SOLN
INTRAVENOUS | Status: DC | PRN
  Administered 2015-04-24: 08:00:00 via INTRAVENOUS

## 2015-04-24 MED ORDER — PROMETHAZINE HCL 25 MG/ML IJ SOLN: 6.2500 mg | INTRAMUSCULAR | Status: DC | PRN

## 2015-04-24 MED ORDER — SUCCINYLCHOLINE CHLORIDE 20 MG/ML IJ SOLN
INTRAMUSCULAR | Status: AC
  Filled 2015-04-24: qty 1

## 2015-04-24 MED ORDER — HYDROMORPHONE HCL 1 MG/ML IJ SOLN
INTRAMUSCULAR | Status: AC
  Filled 2015-04-24: qty 1

## 2015-04-24 MED ORDER — MIDAZOLAM HCL 2 MG/2ML IJ SOLN
INTRAMUSCULAR | Status: AC
  Filled 2015-04-24: qty 4

## 2015-04-24 MED ORDER — METOCLOPRAMIDE HCL 5 MG/ML IJ SOLN: 5.0000 mg | Freq: Three times a day (TID) | INTRAMUSCULAR | Status: DC | PRN

## 2015-04-24 MED ORDER — FERROUS SULFATE 325 (65 FE) MG PO TABS
325.0000 mg | ORAL_TABLET | ORAL | Status: DC
  Administered 2015-04-24: 325 mg via ORAL
  Filled 2015-04-24: qty 1

## 2015-04-24 MED ORDER — HYDROMORPHONE HCL 1 MG/ML IJ SOLN: 1.0000 mg | INTRAMUSCULAR | Status: DC | PRN

## 2015-04-24 MED ORDER — SODIUM CHLORIDE 0.9 % IJ SOLN
INTRAMUSCULAR | Status: AC
  Filled 2015-04-24: qty 20

## 2015-04-24 MED ORDER — LIDOCAINE HCL (CARDIAC) 20 MG/ML IV SOLN
INTRAVENOUS | Status: AC
  Filled 2015-04-24: qty 5

## 2015-04-24 MED ORDER — MEPERIDINE HCL 25 MG/ML IJ SOLN: 6.2500 mg | INTRAMUSCULAR | Status: DC | PRN

## 2015-04-24 MED ORDER — PROPOFOL 10 MG/ML IV BOLUS
INTRAVENOUS | Status: DC | PRN
  Administered 2015-04-24: 120 mg via INTRAVENOUS

## 2015-04-24 MED ORDER — 0.9 % SODIUM CHLORIDE (POUR BTL) OPTIME
TOPICAL | Status: DC | PRN
  Administered 2015-04-24: 1000 mL

## 2015-04-24 MED ORDER — CALCITRIOL 0.5 MCG PO CAPS: 1.0000 ug | ORAL_CAPSULE | ORAL | Status: DC

## 2015-04-24 MED ORDER — LIDOCAINE HCL (CARDIAC) 10 MG/ML IV SOLN
INTRAVENOUS | Status: DC | PRN
  Administered 2015-04-24: 100 mg via INTRAVENOUS

## 2015-04-24 MED ORDER — SACCHAROMYCES BOULARDII 250 MG PO CAPS
250.0000 mg | ORAL_CAPSULE | Freq: Two times a day (BID) | ORAL | Status: DC
  Administered 2015-04-24 – 2015-04-28 (×8): 250 mg via ORAL
  Filled 2015-04-24 (×8): qty 1

## 2015-04-24 MED ORDER — CEFAZOLIN SODIUM 1-5 GM-% IV SOLN
1.0000 g | Freq: Four times a day (QID) | INTRAVENOUS | Status: AC
  Administered 2015-04-24 (×3): 1 g via INTRAVENOUS
  Filled 2015-04-24 (×3): qty 50

## 2015-04-24 MED ORDER — ONDANSETRON HCL 4 MG/2ML IJ SOLN
INTRAMUSCULAR | Status: DC | PRN
  Administered 2015-04-24: 4 mg via INTRAVENOUS

## 2015-04-24 MED ORDER — FENTANYL CITRATE (PF) 100 MCG/2ML IJ SOLN
INTRAMUSCULAR | Status: DC | PRN
  Administered 2015-04-24: 50 ug via INTRAVENOUS

## 2015-04-24 MED ORDER — OXYCODONE HCL 5 MG PO TABS
5.0000 mg | ORAL_TABLET | ORAL | Status: DC | PRN
  Administered 2015-04-24 – 2015-04-26 (×8): 10 mg via ORAL
  Administered 2015-04-27: 5 mg via ORAL
  Administered 2015-04-27: 10 mg via ORAL
  Administered 2015-04-27: 5 mg via ORAL
  Filled 2015-04-24 (×5): qty 2
  Filled 2015-04-24: qty 1
  Filled 2015-04-24 (×5): qty 2

## 2015-04-24 MED ORDER — INSULIN DETEMIR 100 UNIT/ML ~~LOC~~ SOLN
10.0000 [IU] | Freq: Every day | SUBCUTANEOUS | Status: DC
  Administered 2015-04-24 – 2015-04-27 (×3): 10 [IU] via SUBCUTANEOUS
  Filled 2015-04-24 (×5): qty 0.1

## 2015-04-24 MED ORDER — ASPIRIN EC 325 MG PO TBEC
325.0000 mg | DELAYED_RELEASE_TABLET | Freq: Every day | ORAL | Status: DC
  Administered 2015-04-24 – 2015-04-28 (×4): 325 mg via ORAL
  Filled 2015-04-24 (×4): qty 1

## 2015-04-24 MED ORDER — METHOCARBAMOL 1000 MG/10ML IJ SOLN
500.0000 mg | Freq: Four times a day (QID) | INTRAVENOUS | Status: DC | PRN
  Filled 2015-04-24: qty 5

## 2015-04-24 MED ORDER — PHENYLEPHRINE HCL 10 MG/ML IJ SOLN
INTRAMUSCULAR | Status: DC | PRN
  Administered 2015-04-24 (×2): 80 ug via INTRAVENOUS

## 2015-04-24 MED ORDER — ACETAMINOPHEN 650 MG RE SUPP: 650.0000 mg | Freq: Four times a day (QID) | RECTAL | Status: DC | PRN

## 2015-04-24 MED ORDER — PROPOFOL 10 MG/ML IV BOLUS
INTRAVENOUS | Status: AC
  Filled 2015-04-24: qty 20

## 2015-04-24 MED ORDER — METOCLOPRAMIDE HCL 5 MG PO TABS: 5.0000 mg | ORAL_TABLET | Freq: Three times a day (TID) | ORAL | Status: DC | PRN

## 2015-04-24 MED ORDER — CALCIUM ACETATE (PHOS BINDER) 667 MG PO CAPS
1334.0000 mg | ORAL_CAPSULE | Freq: Three times a day (TID) | ORAL | Status: DC
  Administered 2015-04-24 – 2015-04-27 (×7): 1334 mg via ORAL
  Filled 2015-04-24 (×7): qty 2

## 2015-04-24 MED ORDER — ROCURONIUM BROMIDE 50 MG/5ML IV SOLN
INTRAVENOUS | Status: AC
  Filled 2015-04-24: qty 1

## 2015-04-24 MED ORDER — SODIUM CHLORIDE 0.9 % IV SOLN
INTRAVENOUS | Status: DC
  Administered 2015-04-24: 14:00:00 via INTRAVENOUS

## 2015-04-24 SURGICAL SUPPLY — 41 items
BLADE SAW RECIP 87.9 MT (BLADE) ×3 IMPLANT
BLADE SURG 21 STRL SS (BLADE) ×3 IMPLANT
BNDG COHESIVE 6X5 TAN STRL LF (GAUZE/BANDAGES/DRESSINGS) ×3 IMPLANT
BNDG GAUZE ELAST 4 BULKY (GAUZE/BANDAGES/DRESSINGS) ×3 IMPLANT
COVER SURGICAL LIGHT HANDLE (MISCELLANEOUS) ×6 IMPLANT
CUFF TOURNIQUET SINGLE 34IN LL (TOURNIQUET CUFF) ×3 IMPLANT
CUFF TOURNIQUET SINGLE 44IN (TOURNIQUET CUFF) IMPLANT
DRAPE EXTREMITY T 121X128X90 (DRAPE) ×3 IMPLANT
DRAPE PROXIMA HALF (DRAPES) ×6 IMPLANT
DRAPE U-SHAPE 47X51 STRL (DRAPES) ×3 IMPLANT
DRSG ADAPTIC 3X8 NADH LF (GAUZE/BANDAGES/DRESSINGS) ×3 IMPLANT
DRSG PAD ABDOMINAL 8X10 ST (GAUZE/BANDAGES/DRESSINGS) ×3 IMPLANT
DURAPREP 26ML APPLICATOR (WOUND CARE) ×3 IMPLANT
ELECT REM PT RETURN 9FT ADLT (ELECTROSURGICAL) ×3
ELECTRODE REM PT RTRN 9FT ADLT (ELECTROSURGICAL) ×1 IMPLANT
GAUZE SPONGE 4X4 12PLY STRL (GAUZE/BANDAGES/DRESSINGS) ×3 IMPLANT
GLOVE BIO SURGEON STRL SZ 6.5 (GLOVE) ×6 IMPLANT
GLOVE BIO SURGEON STRL SZ7.5 (GLOVE) ×3 IMPLANT
GLOVE BIO SURGEONS STRL SZ 6.5 (GLOVE) ×3
GLOVE BIOGEL PI IND STRL 9 (GLOVE) ×1 IMPLANT
GLOVE BIOGEL PI INDICATOR 9 (GLOVE) ×2
GLOVE SURG ORTHO 9.0 STRL STRW (GLOVE) ×3 IMPLANT
GOWN STRL REUS W/ TWL XL LVL3 (GOWN DISPOSABLE) ×2 IMPLANT
GOWN STRL REUS W/TWL XL LVL3 (GOWN DISPOSABLE) ×4
KIT BASIN OR (CUSTOM PROCEDURE TRAY) ×3 IMPLANT
KIT ROOM TURNOVER OR (KITS) ×3 IMPLANT
MANIFOLD NEPTUNE II (INSTRUMENTS) ×3 IMPLANT
NS IRRIG 1000ML POUR BTL (IV SOLUTION) ×3 IMPLANT
PACK GENERAL/GYN (CUSTOM PROCEDURE TRAY) ×3 IMPLANT
PAD ABD 8X10 STRL (GAUZE/BANDAGES/DRESSINGS) ×3 IMPLANT
PAD ARMBOARD 7.5X6 YLW CONV (MISCELLANEOUS) ×6 IMPLANT
SPONGE GAUZE 4X4 12PLY STER LF (GAUZE/BANDAGES/DRESSINGS) ×3 IMPLANT
SPONGE LAP 18X18 X RAY DECT (DISPOSABLE) ×3 IMPLANT
STAPLER VISISTAT 35W (STAPLE) ×3 IMPLANT
STOCKINETTE IMPERVIOUS LG (DRAPES) ×3 IMPLANT
SUT SILK 2 0 (SUTURE) ×2
SUT SILK 2-0 18XBRD TIE 12 (SUTURE) ×1 IMPLANT
SUT VIC AB 1 CTX 27 (SUTURE) IMPLANT
TOWEL OR 17X24 6PK STRL BLUE (TOWEL DISPOSABLE) ×3 IMPLANT
TOWEL OR 17X26 10 PK STRL BLUE (TOWEL DISPOSABLE) ×3 IMPLANT
WATER STERILE IRR 1000ML POUR (IV SOLUTION) ×3 IMPLANT

## 2015-04-24 NOTE — Progress Notes (Signed)
Utilization review completed.  

## 2015-04-24 NOTE — Anesthesia Procedure Notes (Signed)
Procedure Name: LMA Insertion Date/Time: 04/24/2015 8:41 AM Performed by: Glo Herring B Pre-anesthesia Checklist: Patient identified, Emergency Drugs available, Suction available, Patient being monitored and Timeout performed Patient Re-evaluated:Patient Re-evaluated prior to inductionOxygen Delivery Method: Circle system utilized Preoxygenation: Pre-oxygenation with 100% oxygen Intubation Type: IV induction Ventilation: Mask ventilation without difficulty LMA: LMA inserted LMA Size: 4.0 Number of attempts: 1

## 2015-04-24 NOTE — Anesthesia Postprocedure Evaluation (Signed)
  Anesthesia Post-op Note  Patient: Jackie [name]  Procedure(s) Performed: Procedure(s): LEFT BELOW KNEE AMPUTATION (Left)  Patient Location: PACU  Anesthesia Type:General  Level of Consciousness: awake and alert   Airway and Oxygen Therapy: Patient Spontanous Breathing and Patient connected to nasal cannula oxygen  Post-op Pain: moderate  Post-op Assessment: Post-op Vital signs reviewed and Patient's Cardiovascular Status Stable              Post-op Vital Signs: Reviewed and stable  Last Vitals:  Filed Vitals:   04/24/15 0915  BP: 136/64  Pulse: 73  Temp: 36.7 C  Resp: 16    Complications: No apparent anesthesia complications

## 2015-04-24 NOTE — Op Note (Signed)
   Date of Surgery: 04/24/2015  INDICATIONS: Ms. Dalal is a 80 y.o.-year-old female who has severe peripheral vascular disease gangrene left foot failed conservative limb salvage.  PREOPERATIVE DIAGNOSIS: Gangrene left foot  POSTOPERATIVE DIAGNOSIS: Same.  PROCEDURE: Transtibial amputation  SURGEON: Lajoyce Corners, M.D.  ANESTHESIA:  general  IV FLUIDS AND URINE: See anesthesia.  ESTIMATED BLOOD LOSS: min mL.  COMPLICATIONS: None.  DESCRIPTION OF PROCEDURE: The patient was brought to the operating room and underwent a general anesthetic. After adequate levels of anesthesia were obtained patient's lower extremity was prepped using DuraPrep draped into a sterile field. A timeout was called.  A transverse incision was made 11 cm distal to the tibial tubercle. This curved proximally and a large posterior flap was created. The tibia was transected 1 cm proximal to the skin incision. The fibula was transected just proximal to the tibial incision. The tibia was beveled anteriorly. A large posterior flap was created. The sciatic nerve was pulled cut and allowed to retract. The vascular bundles were suture ligated with 2-0 silk. The deep and superficial fascial layers were closed using #1 Vicryl. The skin was closed using staples and 2-0 nylon. The wound was covered with Adaptic orthopedic sponges AB dressing Kerlix and Coban. Patient was extubated taken to the PACU in stable condition.  Aldean Baker, MD [facility] 9:09 AM

## 2015-04-24 NOTE — Progress Notes (Signed)
Pharmacy called to review med rec

## 2015-04-24 NOTE — Transfer of Care (Signed)
Immediate Anesthesia Transfer of Care Note  Patient: Jackie [name]  Procedure(s) Performed: Procedure(s): LEFT BELOW KNEE AMPUTATION (Left)  Patient Location: PACU  Anesthesia Type:General  Level of Consciousness: awake and alert   Airway & Oxygen Therapy: Patient Spontanous Breathing and Patient connected to nasal cannula oxygen  Post-op Assessment: Report given to RN, Post -op Vital signs reviewed and stable and Patient moving all extremities X 4  Post vital signs: Reviewed and stable  Last Vitals:  Filed Vitals:   04/24/15 0915  BP:   Pulse:   Temp: 36.7 C  Resp:     Complications: No apparent anesthesia complications

## 2015-04-24 NOTE — H&P (Signed)
Jackie [name] is an 80 y.o. female.   Chief Complaint: Peripheral vascular disease with painful ischemic changes to the left lower extremity HPI: Patient is a 80 year old woman who was undergone vascular workup by the vascular vein surgeon's. Patient does not have any further revascularization options. She presents with ischemic ulcerations to the left foot and presents for a left transtibial amputation.  Past Medical History  Diagnosis Date  . Hypertension   . Diabetes mellitus   . Thyroid disease     pt denies this  . Anemia   . Hyperlipidemia   . Arthritis   . Pneumonia   . End-stage renal disease on hemodialysis [facility])     dialysis T/Th/Sa    Past Surgical History  Procedure Laterality Date  . Arteriovenous graft placement Left     non function  . Eye surgery Bilateral     catarct  . Cesarean section    . Av fistula placement Right 01/30/2013    Procedure: ARTERIOVENOUS (AV) FISTULA CREATION;  Surgeon: Fransisco Hertz, MD;  Location: [facility] OR;  Service: Vascular;  Laterality: Right;  . Insertion of dialysis catheter Right 04/24/2013    Procedure: INSERTION OF DIALYSIS CATHETER;  Surgeon: Larina Earthly, MD;  Location: [facility] OR;  Service: Vascular;  Laterality: Right;  . Fistula superficialization Right 04/24/2013    Procedure: FISTULA SUPERFICIALIZATION;  Surgeon: Larina Earthly, MD;  Location: [facility] OR;  Service: Vascular;  Laterality: Right;  . Fistulogram Right 07/26/2013    Procedure: FISTULOGRAM;  Surgeon: Larina Earthly, MD;  Location: [facility] CATH LAB;  Service: Cardiovascular;  Laterality: Right;  . Peripheral vascular catheterization N/A 12/05/2014    Procedure: Abdominal Aortogram;  Surgeon: Fransisco Hertz, MD;  Location: [facility] INVASIVE CV LAB;  Service: Cardiovascular;  Laterality: N/A;  . Peripheral vascular catheterization N/A 03/19/2015    Procedure: Lower Extremity Angiography;  Surgeon: Nada Libman, MD;  Location: [facility] INVASIVE CV LAB;  Service: Cardiovascular;  Laterality: N/A;  .  Peripheral vascular catheterization  03/19/2015    Procedure: Peripheral Vascular Intervention;  Surgeon: Nada Libman, MD;  Location: [facility] INVASIVE CV LAB;  Service: Cardiovascular;;  left sfa and popliteal stent  . Tonsillectomy      Family History  Problem Relation Age of Onset  . Diabetes Mother   . Heart disease Mother   . Hypertension Mother    Social History:  reports that she has never smoked. She has never used smokeless tobacco. She reports that she does not drink alcohol or use illicit drugs.  Allergies: No Known Allergies  Medications Prior to Admission  Medication Sig Dispense Refill  . albuterol (PROVENTIL HFA;VENTOLIN HFA) 108 (90 BASE) MCG/ACT inhaler Inhale 1-2 puffs into the lungs every 6 (six) hours as needed for wheezing. 1 Inhaler 0  . B Complex-C-Folic Acid (B COMPLEX-VITAMIN C-FOLIC ACID) 1 MG tablet Take 1 tablet by mouth daily with breakfast.    . calcitRIOL (ROCALTROL) 0.5 MCG capsule Take 2 capsules (1 mcg total) by mouth Every Tuesday,Thursday,and Saturday with dialysis. 30 capsule 0  . calcium acetate (PHOSLO) 667 MG capsule Take 2 capsules by mouth 3 (three) times daily with meals.    . cefTAZidime 2 g in dextrose 5 % 50 mL Inject 2 g into the vein Every Tuesday,Thursday,and Saturday with dialysis.    Marland Kitchen FOSRENOL 1000 MG chewable tablet Chew 2,000 mg by mouth 3 (three) times daily with meals.     . insulin aspart (NOVOLOG) 100 UNIT/ML injection Inject  5 Units into the skin daily as needed for high blood sugar.     . insulin detemir (LEVEMIR) 100 UNIT/ML injection Inject 0.1 mLs (10 Units total) into the skin at bedtime. (Patient taking differently: Inject 15 Units into the skin at bedtime. ) 10 mL 11  . IRON PO Take 1 tablet by mouth daily.    Marland Kitchen oxyCODONE (ROXICODONE) 5 MG immediate release tablet Take 1 tablet (5 mg total) by mouth every 4 (four) hours as needed for moderate pain. 15 tablet 0  . pregabalin (LYRICA) 50 MG capsule Take 50 mg by mouth 3 (three)  times daily as needed. For nerve pain in feet per patient    . saccharomyces boulardii (FLORASTOR) 250 MG capsule Take 1 capsule (250 mg total) by mouth 2 (two) times daily. (Patient not taking: Reported on 03/19/2015) 30 capsule 0  . silver sulfADIAZINE (SILVADENE) 1 % cream Apply topically daily. Apply Silvadene to bilat foot wounds Q day, then cover with gauze and kerlex.  Remove all previous Silvadene before re-applying more each time. (Patient not taking: Reported on 03/19/2015) 50 g 0  . vancomycin 500 mg in sodium chloride 0.9 % 100 mL Inject 500 mg into the vein Every Tuesday,Thursday,and Saturday with dialysis.      Results for orders placed or performed in visit on 04/22/15 (from the past 48 hour(s))  Glucose, capillary     Status: Abnormal   Collection Time: 04/22/15  7:49 AM  Result Value Ref Range   Glucose-Capillary 181 (H) 65 - 99 mg/dL  Glucose, capillary     Status: Abnormal   Collection Time: 04/22/15  9:45 AM  Result Value Ref Range   Glucose-Capillary 128 (H) 65 - 99 mg/dL  Glucose, capillary     Status: Abnormal   Collection Time: 04/23/15  7:49 AM  Result Value Ref Range   Glucose-Capillary 118 (H) 65 - 99 mg/dL  Glucose, capillary     Status: None   Collection Time: 04/23/15  9:44 AM  Result Value Ref Range   Glucose-Capillary 65 65 - 99 mg/dL  Glucose, capillary     Status: Abnormal   Collection Time: 04/23/15  9:52 AM  Result Value Ref Range   Glucose-Capillary 59 (L) 65 - 99 mg/dL  Glucose, capillary     Status: Abnormal   Collection Time: 04/23/15 10:02 AM  Result Value Ref Range   Glucose-Capillary 59 (L) 65 - 99 mg/dL  Glucose, capillary     Status: None   Collection Time: 04/23/15 10:10 AM  Result Value Ref Range   Glucose-Capillary 65 65 - 99 mg/dL   No results found.  Review of Systems  All other systems reviewed and are negative.   Blood pressure 170/71, pulse 75, temperature 98.4 F (36.9 C), temperature source Oral, resp. rate 18, weight  58.968 kg (130 lb), SpO2 100 %. Physical Exam  On examination patient has ischemic ulcerations the left foot. Assessment/Plan Assessment: This evening ulcerations left foot without revascularization options.  Plan: Patient is scheduled for a left transtibial amputation. Risks and benefits were discussed including the risk of the wound not healing. Patient states she understands and wishes to proceed at this time.  DUDA,MARCUS V 04/24/2015, 7:05 AM

## 2015-04-25 ENCOUNTER — Encounter (HOSPITAL_COMMUNITY): Payer: Self-pay | Admitting: Orthopedic Surgery

## 2015-04-25 ENCOUNTER — Other Ambulatory Visit (HOSPITAL_COMMUNITY): Payer: Self-pay | Admitting: Orthopedic Surgery

## 2015-04-25 LAB — CBC
HCT: 25.7 % — ABNORMAL LOW (ref 36.0–46.0)
Hemoglobin: 7.4 g/dL — ABNORMAL LOW (ref 12.0–15.0)
MCH: 24.5 pg — ABNORMAL LOW (ref 26.0–34.0)
MCHC: 28.8 g/dL — ABNORMAL LOW (ref 30.0–36.0)
MCV: 85.1 fL (ref 78.0–100.0)
PLATELETS: 198 10*3/uL (ref 150–400)
RBC: 3.02 MIL/uL — AB (ref 3.87–5.11)
RDW: 19.5 % — ABNORMAL HIGH (ref 11.5–15.5)
WBC: 7.4 10*3/uL (ref 4.0–10.5)

## 2015-04-25 LAB — RENAL FUNCTION PANEL
ALBUMIN: 2.1 g/dL — AB (ref 3.5–5.0)
Anion gap: 15 (ref 5–15)
BUN: 24 mg/dL — ABNORMAL HIGH (ref 6–20)
CALCIUM: 8.2 mg/dL — AB (ref 8.9–10.3)
CHLORIDE: 93 mmol/L — AB (ref 101–111)
CO2: 29 mmol/L (ref 22–32)
CREATININE: 5.35 mg/dL — AB (ref 0.44–1.00)
GFR, EST AFRICAN AMERICAN: 8 mL/min — AB (ref >60)
GFR, EST NON AFRICAN AMERICAN: 7 mL/min — AB (ref >60)
Glucose, Bld: 106 mg/dL — ABNORMAL HIGH (ref 65–99)
PHOSPHORUS: 5 mg/dL — AB (ref 2.5–4.6)
Potassium: 3.3 mmol/L — ABNORMAL LOW (ref 3.5–5.1)
Sodium: 137 mmol/L (ref 135–145)

## 2015-04-25 LAB — GLUCOSE, CAPILLARY
GLUCOSE-CAPILLARY: 58 mg/dL — AB (ref 65–99)
GLUCOSE-CAPILLARY: 97 mg/dL (ref 65–99)
GLUCOSE-CAPILLARY: 100 mg/dL — AB (ref 65–99)
Glucose-Capillary: 95 mg/dL (ref 65–99)

## 2015-04-25 MED ORDER — DARBEPOETIN ALFA 200 MCG/0.4ML IJ SOSY
200.0000 ug | PREFILLED_SYRINGE | INTRAMUSCULAR | Status: DC
  Administered 2015-04-25: 200 ug via INTRAVENOUS
  Filled 2015-04-25: qty 0.4

## 2015-04-25 MED ORDER — RENA-VITE PO TABS
1.0000 | ORAL_TABLET | Freq: Every day | ORAL | Status: DC
  Administered 2015-04-25 – 2015-04-27 (×3): 1 via ORAL
  Filled 2015-04-25 (×3): qty 1

## 2015-04-25 MED ORDER — CINACALCET HCL 30 MG PO TABS
30.0000 mg | ORAL_TABLET | Freq: Every day | ORAL | Status: DC
  Administered 2015-04-26 – 2015-04-27 (×2): 30 mg via ORAL
  Filled 2015-04-25 (×2): qty 1

## 2015-04-25 MED ORDER — OXYCODONE-ACETAMINOPHEN 5-325 MG PO TABS: 1.0000 | ORAL_TABLET | ORAL | Status: DC | PRN

## 2015-04-25 MED ORDER — SODIUM CHLORIDE 0.9 % IV SOLN
125.0000 mg | INTRAVENOUS | Status: DC
  Administered 2015-04-25 – 2015-04-27 (×2): 125 mg via INTRAVENOUS
  Filled 2015-04-25 (×4): qty 10

## 2015-04-25 MED ORDER — CALCITRIOL 0.5 MCG PO CAPS
1.5000 ug | ORAL_CAPSULE | ORAL | Status: DC
  Administered 2015-04-27: 1.5 ug via ORAL

## 2015-04-25 MED ORDER — DARBEPOETIN ALFA 200 MCG/0.4ML IJ SOSY
PREFILLED_SYRINGE | INTRAMUSCULAR | Status: AC
  Filled 2015-04-25: qty 0.4

## 2015-04-25 NOTE — Discharge Summary (Signed)
**Note Jackie-Identified via Obfuscation** Physician Discharge Summary  Patient ID: Jackie [name] MRN: [id number] DOB/AGE: 78-Dec-1936 78 y.o.  Admit date: 04/24/2015 Discharge date: 04/25/2015  Admission Diagnoses: Gangrene left foot  Discharge Diagnoses:  Active Problems:   S/P unilateral BKA (below knee amputation) [facility])   Discharged Condition: stable  Hospital Course: Patient's hospital course was essentially unremarkable. Patient underwent a transtibial amputation. Postoperatively patient required additional care and was discharged to skilled nursing.  Consults: None  Significant Diagnostic Studies: labs: Routine labs  Treatments: surgery: See operative note  Discharge Exam: Blood pressure 129/58, pulse 75, temperature 98.8 F (37.1 C), temperature source Oral, resp. rate 16, height  (1.727 m), weight 58.968 kg (130 lb), SpO2 100 %. Incision/Wound: dressing clean and dry  Disposition: 01-Home or Self Care     Medication List    ASK your doctor about these medications        albuterol 108 (90 BASE) MCG/ACT inhaler  Commonly known as:  PROVENTIL HFA;VENTOLIN HFA  Inhale 1-2 puffs into the lungs every 6 (six) hours as needed for wheezing.     calcitRIOL 0.5 MCG capsule  Commonly known as:  ROCALTROL  Take 2 capsules (1 mcg total) by mouth Every Tuesday,Thursday,and Saturday with dialysis.     calcium acetate 667 MG capsule  Commonly known as:  PHOSLO  Take 2 capsules by mouth 3 (three) times daily with meals.     cefTAZidime 2 g in dextrose 5 % 50 mL  Inject 2 g into the vein Every Tuesday,Thursday,and Saturday with dialysis.     FOSRENOL 1000 MG chewable tablet  Generic drug:  lanthanum  Chew 2,000 mg by mouth 3 (three) times daily with meals.     insulin detemir 100 UNIT/ML injection  Commonly known as:  LEVEMIR  Inject 0.1 mLs (10 Units total) into the skin at bedtime.     IRON PO  Take 1 tablet by mouth 3 (three) times a week.     saccharomyces boulardii 250 MG capsule  Commonly known  as:  FLORASTOR  Take 1 capsule (250 mg total) by mouth 2 (two) times daily.     vancomycin 500 mg in sodium chloride 0.9 % 100 mL  Inject 500 mg into the vein Every Tuesday,Thursday,and Saturday with dialysis.         Signed: Fremon Zacharia [name] 04/25/2015, 7:18 AM

## 2015-04-25 NOTE — Progress Notes (Signed)
Patient ID: Jackie [name], female   DOB: Apr 08, 1935, 80 y.o.   MRN: [id number] Postoperative day 1 left transtibial amputation. Plan for discharge to skilled nursing facility. Discharge summary completed.

## 2015-04-25 NOTE — Consult Note (Signed)
Indication for Consultation:  Management of ESRD/hemodialysis; anemia, hypertension/volume and secondary hyperparathyroidism  HPI: Jackie [name] is a 80 y.o. female who presented yesterday for planned L BKA with Dr Lajoyce Corners. She receives HD [facility], history of HTN, DM and atherosclerosis with ischemic ulcers. She had previously undergone atherectomy and angioplasty and stenting with [facility]. Her R foot had healed but L foot ulcer remained despite previous attempts to salvage limb and hyperbaric wound care. She was referred to Dr Lajoyce Corners last week, she has no further revascularization options and decided to proceed with L BKA. Will arrange HD while admitted.   Past Medical History  Diagnosis Date  . Hypertension   . Diabetes mellitus   . Thyroid disease     pt denies this  . Anemia   . Hyperlipidemia   . Arthritis   . Pneumonia   . End-stage renal disease on hemodialysis [facility])     dialysis T/Th/Sa   Past Surgical History  Procedure Laterality Date  . Arteriovenous graft placement Left     non function  . Eye surgery Bilateral     catarct  . Cesarean section    . Av fistula placement Right 01/30/2013    Procedure: ARTERIOVENOUS (AV) FISTULA CREATION;  Surgeon: Fransisco Hertz, MD;  Location: [facility] OR;  Service: Vascular;  Laterality: Right;  . Insertion of dialysis catheter Right 04/24/2013    Procedure: INSERTION OF DIALYSIS CATHETER;  Surgeon: Larina Earthly, MD;  Location: [facility] OR;  Service: Vascular;  Laterality: Right;  . Fistula superficialization Right 04/24/2013    Procedure: FISTULA SUPERFICIALIZATION;  Surgeon: Larina Earthly, MD;  Location: [facility] OR;  Service: Vascular;  Laterality: Right;  . Fistulogram Right 07/26/2013    Procedure: FISTULOGRAM;  Surgeon: Larina Earthly, MD;  Location: [facility] CATH LAB;  Service: Cardiovascular;  Laterality: Right;  . Peripheral vascular catheterization N/A 12/05/2014    Procedure: Abdominal Aortogram;  Surgeon: Fransisco Hertz, MD;  Location: [facility] INVASIVE CV LAB;  Service:  Cardiovascular;  Laterality: N/A;  . Peripheral vascular catheterization N/A 03/19/2015    Procedure: Lower Extremity Angiography;  Surgeon: Nada Libman, MD;  Location: [facility] INVASIVE CV LAB;  Service: Cardiovascular;  Laterality: N/A;  . Peripheral vascular catheterization  03/19/2015    Procedure: Peripheral Vascular Intervention;  Surgeon: Nada Libman, MD;  Location: [facility] INVASIVE CV LAB;  Service: Cardiovascular;;  left sfa and popliteal stent  . Tonsillectomy     Family History  Problem Relation Age of Onset  . Diabetes Mother   . Heart disease Mother   . Hypertension Mother    Social History:  reports that she has never smoked. She has never used smokeless tobacco. She reports that she does not drink alcohol or use illicit drugs. No Known Allergies Prior to Admission medications   Medication Sig Start Date End Date Taking? Authorizing Provider  albuterol (PROVENTIL HFA;VENTOLIN HFA) 108 (90 BASE) MCG/ACT inhaler Inhale 1-2 puffs into the lungs every 6 (six) hours as needed for wheezing. 12/27/12  Yes April Palumbo, MD  calcitRIOL (ROCALTROL) 0.5 MCG capsule Take 2 capsules (1 mcg total) by mouth Every Tuesday,Thursday,and Saturday with dialysis. 12/07/14  Yes Osvaldo Shipper, MD  calcium acetate (PHOSLO) 667 MG capsule Take 2 capsules by mouth 3 (three) times daily with meals. 12/28/14  Yes Historical Provider, MD  cefTAZidime 2 g in dextrose 5 % 50 mL Inject 2 g into the vein Every Tuesday,Thursday,and Saturday with dialysis. 12/08/14  Yes Osvaldo Shipper, MD  FOSRENOL 1000 MG chewable tablet Chew 2,000 mg by mouth 3 (three) times daily with meals.  01/18/13  Yes Historical Provider, MD  insulin detemir (LEVEMIR) 100 UNIT/ML injection Inject 0.1 mLs (10 Units total) into the skin at bedtime. Patient taking differently: Inject 15 Units into the skin at bedtime.  12/07/14  Yes Osvaldo Shipper, MD  IRON PO Take 1 tablet by mouth 3 (three) times a week.    Yes Historical Provider, MD   saccharomyces boulardii (FLORASTOR) 250 MG capsule Take 1 capsule (250 mg total) by mouth 2 (two) times daily. 12/07/14  Yes Osvaldo Shipper, MD  vancomycin 500 mg in sodium chloride 0.9 % 100 mL Inject 500 mg into the vein Every Tuesday,Thursday,and Saturday with dialysis. 12/07/14  Yes Osvaldo Shipper, MD  oxyCODONE-acetaminophen (ROXICET) 5-325 MG tablet Take 1 tablet by mouth every 4 (four) hours as needed for severe pain. 04/25/15   Nadara Mustard, MD   Current Facility-Administered Medications  Medication Dose Route Frequency Provider Last Rate Last Dose  . 0.9 %  sodium chloride infusion   Intravenous Continuous Nadara Mustard, MD 10 mL/hr at 04/24/15 1412    . acetaminophen (TYLENOL) tablet 650 mg  650 mg Oral Q6H PRN Nadara Mustard, MD       Or  . acetaminophen (TYLENOL) suppository 650 mg  650 mg Rectal Q6H PRN Nadara Mustard, MD      . albuterol (PROVENTIL) (2.5 MG/3ML) 0.083% nebulizer solution 1-2 mL  1-2 mL Inhalation Q6H PRN Nadara Mustard, MD      . aspirin EC tablet 325 mg  325 mg Oral Daily Nadara Mustard, MD   325 mg at 04/25/15 0947  . calcitRIOL (ROCALTROL) capsule 1 mcg  1 mcg Oral Q T,Th,Sa-HD Aldean Baker V, MD      . calcium acetate (PHOSLO) capsule 1,334 mg  1,334 mg Oral TID WC Nadara Mustard, MD   1,334 mg at 04/25/15 0750  . ferrous sulfate tablet 325 mg  325 mg Oral Once per day on Mon Wed Fri Nadara Mustard, MD   325 mg at 04/24/15 1412  . HYDROmorphone (DILAUDID) injection 1 mg  1 mg Intravenous Q2H PRN Nadara Mustard, MD      . Influenza vac split quadrivalent PF (FLUARIX) injection 0.5 mL  0.5 mL Intramuscular Tomorrow-1000 Aldean Baker V, MD      . insulin aspart (novoLOG) injection 0-9 Units  0-9 Units Subcutaneous TID WC Nadara Mustard, MD   0 Units at 04/24/15 1245  . insulin aspart (novoLOG) injection 3 Units  3 Units Subcutaneous TID WC Nadara Mustard, MD   3 Units at 04/24/15 1245  . insulin detemir (LEVEMIR) injection 10 Units  10 Units Subcutaneous QHS Nadara Mustard, MD    10 Units at 04/24/15 2333  . lanthanum (FOSRENOL) chewable tablet 2,000 mg  2,000 mg Oral TID WC Nadara Mustard, MD   2,000 mg at 04/25/15 0751  . methocarbamol (ROBAXIN) tablet 500 mg  500 mg Oral Q6H PRN Nadara Mustard, MD   500 mg at 04/25/15 0746   Or  . methocarbamol (ROBAXIN) 500 mg in dextrose 5 % 50 mL IVPB  500 mg Intravenous Q6H PRN Nadara Mustard, MD      . metoCLOPramide (REGLAN) tablet 5-10 mg  5-10 mg Oral Q8H PRN Nadara Mustard, MD       Or  . metoCLOPramide (REGLAN) injection 5-10 mg  5-10 mg Intravenous  Q8H PRN Nadara Mustard, MD      . ondansetron [facility]) tablet 4 mg  4 mg Oral Q6H PRN Nadara Mustard, MD       Or  . ondansetron [facility]) injection 4 mg  4 mg Intravenous Q6H PRN Nadara Mustard, MD   4 mg at 04/24/15 1734  . oxyCODONE (Oxy IR/ROXICODONE) immediate release tablet 5-10 mg  5-10 mg Oral Q3H PRN Nadara Mustard, MD   10 mg at 04/25/15 0746  . saccharomyces boulardii (FLORASTOR) capsule 250 mg  250 mg Oral BID Nadara Mustard, MD   250 mg at 04/25/15 0947   Labs: Basic Metabolic Panel:  Recent Labs Lab 04/24/15 0740  NA 139  K 3.2*  CL 97*  CO2 31  GLUCOSE 88  BUN 13  CREATININE 3.66*  CALCIUM 8.0*   Liver Function Tests:  Recent Labs Lab 04/24/15 0740  AST 20  ALT 9*  ALKPHOS 63  BILITOT 0.4  PROT 6.7  ALBUMIN 2.3*   No results for input(s): LIPASE, AMYLASE in the last 168 hours. No results for input(s): AMMONIA in the last 168 hours. CBC:  Recent Labs Lab 04/24/15 0740  WBC 5.1  HGB 9.2*  HCT 32.1*  MCV 86.8  PLT 163   Cardiac Enzymes: No results for input(s): CKTOTAL, CKMB, CKMBINDEX, TROPONINI in the last 168 hours. CBG:  Recent Labs Lab 04/24/15 1223 04/24/15 1654 04/24/15 2311 04/25/15 0713 04/25/15 0759  GLUCAP 90 120* 116* 58* 95   Iron Studies: No results for input(s): IRON, TIBC, TRANSFERRIN, FERRITIN in the last 72 hours. Studies/Results: Dg Chest 2 View  04/24/2015   CLINICAL DATA:  Gangrene of foot.  EXAM: CHEST  2  VIEW  COMPARISON:  January 16, 2015.  FINDINGS: Stable cardiomegaly. No pneumothorax or pleural effusion is noted. No acute pulmonary disease is noted. Vascular stents are noted in the right axillary region. Bony thorax is unremarkable.  IMPRESSION: No active cardiopulmonary disease.   Electronically Signed   By: Lupita Raider, M.D.   On: 04/24/2015 07:54    Review of Systems: Reports feeling well- just tired.  Non healing L foot ulcer which was causing intermittent pain- pain currently controlled post op Denies fever/chills Denies chest pain/sob   Physical Exam: Filed Vitals:   04/24/15 1458 04/24/15 2245 04/25/15 0227 04/25/15 0555  BP: 141/70 145/52 147/66 129/58  Pulse: 71 87 73 75  Temp: 98.1 F (36.7 C) 99.5 F (37.5 C) 97.7 F (36.5 C) 98.8 F (37.1 C)  TempSrc: Oral Oral Axillary Oral  Resp: Height:      Weight:      SpO2: 97% 98% 100% 100%     General: Well developed, well nourished, in no acute distress. Appears tired. Sitting in chair Head: Normocephalic, atraumatic, sclera non-icteric, mucus membranes are moist Neck: Supple. JVD not elevated. Lungs: shallow, unlabored Heart: RRR with S1 S2. No murmurs, rubs, or gallops appreciated. Abdomen: Soft, non-tender, non-distended with normoactive bowel sounds. No rebound/guarding. No obvious abdominal masses. M-S:  Strength and tone appear normal for age.- appears weak Lower extremities: L BKA post op dressing. No edema Neuro: Alert and oriented X 3. Moves all extremities spontaneously. Psych:  Responds to questions appropriately with a normal affect. Dialysis Access: R AVF +b/t  Dialysis Orders: TTS [facility] 4 hrs   57kgs  2K/2Ca  AVF   1800 u heparin venofer 100 x8- last dose 10/11 micera 200 q 2 weeks last  dose given 9/22 Calcitriol 1.50   Assessment/Plan: 1.  Ischemic ulcerations L foot- s/p L BKA 10/5 Dr Lajoyce Corners. Had previous angioplasty and stenting by Dr Myra Gianotti and doing hyperbarics. No further  revascularization options. Pain controlled this AM 2.  ESRD -  TTS [facility], HD today. No heparin post op 3.  Hypertension/volume  - will need new edw post amputation 4.  Anemia  - hgb 9.2 preop- cont Fe bolus for tsat 14 and ESA 5.  Metabolic bone disease -  continune binders, sensipar and calcitriol- last phos 6.6 and PTH 569 6.  Nutrition - renal/carb diet.  7. DM per perimary 8. dispo- Dc to SNF  Jetty Duhamel, NP [facility] [phone] 04/25/2015, 11:44 AM     Pt seen, examined and agree w A/P as above. ESRD patient with L BKA for refractory foot ulcers not responding to stenting and hyperbaric Rx. Plan HD today or possibly tomorrow depending on schedule.  Vinson Moselle MD pager [phone]    cell [phone] 04/25/2015, 3:53 PM

## 2015-04-25 NOTE — Evaluation (Signed)
Physical Therapy Evaluation Patient Details Name: Jackie [name] MRN: [id number] DOB: 19-Mar-1935 Today's Date: 04/25/2015   History of Present Illness  80 y/o female s/p L BKA (04/24/15) due to chronic ischemic changes. Pt also receives dialysis T-Th-Sa.  Clinical Impression  Pt admitted with above diagnosis. Pt currently with functional limitations due to the deficits listed below (see PT Problem List).  Pt will benefit from skilled PT to increase their independence and safety with mobility to allow discharge to the venue listed below.  Pt educated on post amputation positioning and phantom sensation.  Pt would benefit from continued rehab at [facility] after d/c from acute care.     Follow Up Recommendations SNF    Equipment Recommendations  None recommended by PT    Recommendations for Other Services       Precautions / Restrictions Precautions Precautions: Fall Precaution Comments: L BKA Restrictions LLE Weight Bearing: Non weight bearing      Mobility  Bed Mobility Overal bed mobility: Needs Assistance Bed Mobility: Supine to Sit     Supine to sit: HOB elevated;Min guard     General bed mobility comments: heavy use of rail with HOB elevated  Transfers Overall transfer level: Needs assistance Equipment used: Rolling walker (2 wheeled) Transfers: Sit to/from Stand Sit to Stand: +2 physical assistance;Mod assist         General transfer comment: Difficulty with proper hand placement with sit > stand.  In standing, leans R with increased L hip flexion.  Worked on hip extension and trunk extension for upright posture.  Ambulation/Gait Ambulation/Gait assistance: Mod assist;+2 physical assistance Ambulation Distance (Feet): 1 Feet Assistive device: Rolling walker (2 wheeled) Gait Pattern/deviations: Step-to pattern;Shuffle     General Gait Details: Pt able to to go about 1 foot with hopping, but had difficulty with clearing R foot so it was more of a  shuffle.  Stairs            Wheelchair Mobility    Modified Rankin (Stroke Patients Only)       Balance Overall balance assessment: Needs assistance   Sitting balance-Leahy Scale: Fair       Standing balance-Leahy Scale: Zero                               Pertinent Vitals/Pain Pain Assessment: No/denies pain    Home Living Family/patient expects to be discharged to:: Skilled nursing facility                      Prior Function Level of Independence: Independent with assistive device(s)         Comments: I with cane     Hand Dominance        Extremity/Trunk Assessment   Upper Extremity Assessment: Overall WFL for tasks assessed           Lower Extremity Assessment: LLE deficits/detail   LLE Deficits / Details: fair quad set, new L BKA.  Reports phantom sensation, but no pain.     Communication   Communication: No difficulties  Cognition Arousal/Alertness: Awake/alert Behavior During Therapy: WFL for tasks assessed/performed Overall Cognitive Status: Within Functional Limits for tasks assessed                      General Comments General comments (skin integrity, edema, etc.): Pt and son educated on positioning of L LE to prevent knee conracture.  Educated on  importance of tactile input into end of residual limb with touching and rubbing.    Exercises Amputee Exercises Quad Sets: Strengthening;10 reps;Both Gluteal Sets: Strengthening;10 reps;Supine Hip ABduction/ADduction: Strengthening;10 reps;Supine      Assessment/Plan    PT Assessment Patient needs continued PT services  PT Diagnosis Difficulty walking   PT Problem List Decreased strength;Decreased range of motion;Decreased balance;Decreased mobility;Decreased knowledge of precautions;Decreased knowledge of use of DME  PT Treatment Interventions Gait training;Functional mobility training;Therapeutic activities;Therapeutic exercise;DME  instruction;Balance training   PT Goals (Current goals can be found in the Care Plan section) Acute Rehab PT Goals Patient Stated Goal: To go get rehab before going home. PT Goal Formulation: With patient Time For Goal Achievement: 05/09/15 Potential to Achieve Goals: Good    Frequency Min 3X/week   Barriers to discharge        Co-evaluation               End of Session Equipment Utilized During Treatment: Gait belt Activity Tolerance: Patient tolerated treatment well Patient left: in chair;with call bell/phone within reach;with family/visitor present Nurse Communication: Mobility status;Other (comment) (nurse tech)         Time: [phone] PT Time Calculation (min) (ACUTE ONLY): 26 min   Charges:   PT Evaluation $Initial PT Evaluation Tier I: 1 Procedure PT Treatments $Therapeutic Activity: 8-22 mins   PT G Codes:        Jackie [name] 04/25/2015, 10:21 AM

## 2015-04-26 LAB — RENAL FUNCTION PANEL
Albumin: 1.9 g/dL — ABNORMAL LOW (ref 3.5–5.0)
Anion gap: 9 (ref 5–15)
BUN: 24 mg/dL — ABNORMAL HIGH (ref 6–20)
CHLORIDE: 91 mmol/L — AB (ref 101–111)
CO2: 33 mmol/L — AB (ref 22–32)
Calcium: 8.1 mg/dL — ABNORMAL LOW (ref 8.9–10.3)
Creatinine, Ser: 4.3 mg/dL — ABNORMAL HIGH (ref 0.44–1.00)
GFR, EST AFRICAN AMERICAN: 10 mL/min — AB (ref >60)
GFR, EST NON AFRICAN AMERICAN: 9 mL/min — AB (ref >60)
Glucose, Bld: 173 mg/dL — ABNORMAL HIGH (ref 65–99)
POTASSIUM: 4.1 mmol/L (ref 3.5–5.1)
Phosphorus: 4.4 mg/dL (ref 2.5–4.6)
Sodium: 133 mmol/L — ABNORMAL LOW (ref 135–145)

## 2015-04-26 LAB — CBC
HEMATOCRIT: 25.6 % — AB (ref 36.0–46.0)
Hemoglobin: 7.5 g/dL — ABNORMAL LOW (ref 12.0–15.0)
MCH: 25.8 pg — AB (ref 26.0–34.0)
MCHC: 29.3 g/dL — ABNORMAL LOW (ref 30.0–36.0)
MCV: 88 fL (ref 78.0–100.0)
Platelets: 197 10*3/uL (ref 150–400)
RBC: 2.91 MIL/uL — AB (ref 3.87–5.11)
RDW: 19.4 % — ABNORMAL HIGH (ref 11.5–15.5)
WBC: 7.3 10*3/uL (ref 4.0–10.5)

## 2015-04-26 LAB — GLUCOSE, CAPILLARY
GLUCOSE-CAPILLARY: 57 mg/dL — AB (ref 65–99)
GLUCOSE-CAPILLARY: 126 mg/dL — AB (ref 65–99)
GLUCOSE-CAPILLARY: 118 mg/dL — AB (ref 65–99)
Glucose-Capillary: 33 mg/dL — CL (ref 65–99)
Glucose-Capillary: 89 mg/dL (ref 65–99)
Glucose-Capillary: 166 mg/dL — ABNORMAL HIGH (ref 65–99)

## 2015-04-26 MED ORDER — HEPARIN SODIUM (PORCINE) 1000 UNIT/ML DIALYSIS: 1000.0000 [IU] | INTRAMUSCULAR | Status: DC | PRN

## 2015-04-26 MED ORDER — ALTEPLASE 2 MG IJ SOLR
2.0000 mg | Freq: Once | INTRAMUSCULAR | Status: DC | PRN
  Filled 2015-04-26: qty 2

## 2015-04-26 MED ORDER — SODIUM CHLORIDE 0.9 % IV SOLN: 100.0000 mL | INTRAVENOUS | Status: DC | PRN

## 2015-04-26 MED ORDER — PENTAFLUOROPROP-TETRAFLUOROETH EX AERO
1.0000 "application " | INHALATION_SPRAY | CUTANEOUS | Status: DC | PRN
  Administered 2015-04-27: 1 via TOPICAL

## 2015-04-26 MED ORDER — LIDOCAINE-PRILOCAINE 2.5-2.5 % EX CREA: 1.0000 "application " | TOPICAL_CREAM | CUTANEOUS | Status: DC | PRN

## 2015-04-26 MED ORDER — GLUCOSE 40 % PO GEL
ORAL | Status: AC
  Administered 2015-04-26: 37.5 g
  Filled 2015-04-26: qty 1

## 2015-04-26 MED ORDER — LIDOCAINE HCL (PF) 1 % IJ SOLN: 5.0000 mL | INTRAMUSCULAR | Status: DC | PRN

## 2015-04-26 NOTE — Clinical Social Work Note (Signed)
Clinical Social Worker has assessed patient at bedside. Full psychosocial assessment to follow.  Derenda Fennel, MSW, LCSWA [phone] 04/26/2015 1:03 PM

## 2015-04-26 NOTE — Clinical Social Work Placement (Signed)
   CLINICAL SOCIAL WORK PLACEMENT  NOTE  Date:  04/26/2015  Patient Details  Name: Jackie [name] MRN: [id number] Date of Birth: 1935-06-21  Clinical Social Work is seeking post-discharge placement for this patient at the Skilled  Nursing Facility level of care (*CSW will initial, date and re-position this form in  chart as items are completed):  Yes   Patient/family provided with [facility] Clinical Social Work Department's list of facilities offering this level of care within the geographic area requested by the patient (or if unable, by the patient's family).  Yes   Patient/family informed of their freedom to choose among providers that offer the needed level of care, that participate in Medicare, Medicaid or managed care program needed by the patient, have an available bed and are willing to accept the patient.  Yes   Patient/family informed of [facility]'s ownership interest in [facility] and [facility], as well as of the fact that they are under no obligation to receive care at these facilities.  PASRR submitted to EDS on 04/26/15     PASRR number received on 04/26/15     Existing PASRR number confirmed on       FL2 transmitted to all facilities in geographic area requested by pt/family on 04/26/15     FL2 transmitted to all facilities within larger geographic area on       Patient informed that his/her managed care company has contracts with or will negotiate with certain facilities, including the following:        Yes   Patient/family informed of bed offers received.  Patient chooses bed at  [facility] )     Physician recommends and patient chooses bed at      Patient to be transferred to  [facility] ) on  .  Patient to be transferred to facility by       Patient family notified on   of transfer.  Name of family member notified:        PHYSICIAN Please sign FL2     Additional Comment:   FL-2 ON CHART FOR MD  SIGNATURE.  _______________________________________________ Derenda Fennel, MSW, LCSWA [phone] 04/26/2015 3:19 PM

## 2015-04-26 NOTE — Care Management Important Message (Signed)
Important Message  Patient Details  Name: Jackie [name] MRN: [id number] Date of Birth: September 20, 1934   Medicare Important Message Given:  Yes-second notification given    Orson Aloe 04/26/2015, 11:59 AM

## 2015-04-26 NOTE — Progress Notes (Signed)
Subjective:  No cos / tolerated hd yest.   Objective Vital signs in last 24 hours: Filed Vitals:   04/25/15 2000 04/25/15 2123 04/26/15 0139 04/26/15 0515  BP: 129/64 151/74 125/52 131/57  Pulse: 80 86 81 77  Temp:  100 F (37.8 C) 99 F (37.2 C) 98.5 F (36.9 C)  TempSrc:      Resp: Height:      Weight:      SpO2:  98% 100% 95%   Weight change:   Physical Exam: General: NAD , elderly AAF , Ox3 alert Lungs: CTA , unlabored Heart: RRR  No murmurs, rubs, or gallops appreciated. Abdomen: Soft, non-tender, non-distended with normoactive bowel sounds. Lower extremities: L BKA post op dressing. No  R Pedal  edema Dialysis Access: R AVF +bruit   Dialysis Orders: TTS [facility] 4 hrs 57kgs 2K/2Ca AVF 1800 u heparin venofer 100 x8- last dose 10/11 micera 200 q 2 weeks last dose given 9/22 Calcitriol 1.50    Problem/Plan 1. Ischemic ulcerations L foot- s/p L BKA -10/5 Dr Lajoyce Corners. posssible dc to op rehab center today. Pain controlled this AM 2. ESRD - TTS [facility], HD in am . No heparin post op 3. Hypertension/volume -bp stable / will need new edw post amputation 4. Anemia - hgb 9.2 preop> 7.4 post op - cont Fe bolus for tsat 14 and ESA max 5. Metabolic bone disease - continune binders, sensipar and calcitriol- phos 5.0  corec ca 9.7 and PTH 569 6. Nutrition - renal/carb diet.  7. DM per perimary 8. dispo- Dc to SNF  Lenny Pastel, PA-C [facility] Beeper [phone] 04/26/2015,11:46 AM  LOS: 2 days   Pt seen, examined and agree w A/P as above.  Vinson Moselle MD pager [phone]    cell [phone] 04/26/2015, 12:17 PM    Labs: Basic Metabolic Panel:  Recent Labs Lab 04/24/15 0740 04/25/15 1617  NA 139 137  K 3.2* 3.3*  CL 97* 93*  CO2 31 29  GLUCOSE 88 106*  BUN 13 24*  CREATININE 3.66* 5.35*  CALCIUM 8.0* 8.2*  PHOS  --  5.0*   Liver Function Tests:  Recent Labs Lab 04/24/15 0740 04/25/15 1617  AST 20  --   ALT 9*  --    ALKPHOS 63  --   BILITOT 0.4  --   PROT 6.7  --   ALBUMIN 2.3* 2.1*   No results for input(s): LIPASE, AMYLASE in the last 168 hours. No results for input(s): AMMONIA in the last 168 hours. CBC:  Recent Labs Lab 04/24/15 0740 04/25/15 1617  WBC 5.1 7.4  HGB 9.2* 7.4*  HCT 32.1* 25.7*  MCV 86.8 85.1  PLT 163 198   Cardiac Enzymes: No results for input(s): CKTOTAL, CKMB, CKMBINDEX, TROPONINI in the last 168 hours. CBG:  Recent Labs Lab 04/25/15 1144 04/25/15 2121 04/26/15 0635 04/26/15 0715 04/26/15 0807  GLUCAP 97 100* 33* 57* 89    Studies/Results: No results found. Medications: . sodium chloride 10 mL/hr at 04/24/15 1412   . aspirin EC  325 mg Oral Daily  . [START ON 04/27/2015] calcitRIOL  1.5 mcg Oral Q T,Th,Sa-HD  . calcium acetate  1,334 mg Oral TID WC  . cinacalcet  30 mg Oral Q supper  . darbepoetin (ARANESP) injection - DIALYSIS  200 mcg Intravenous Q Thu-HD  . ferric gluconate (FERRLECIT/NULECIT) IV  125 mg Intravenous Q T,Th,Sa-HD  . Influenza vac split quadrivalent PF  0.5 mL Intramuscular  Tomorrow-1000  . insulin aspart  0-9 Units Subcutaneous TID WC  . insulin aspart  3 Units Subcutaneous TID WC  . insulin detemir  10 Units Subcutaneous QHS  . lanthanum  2,000 mg Oral TID WC  . multivitamin  1 tablet Oral QHS  . saccharomyces boulardii  250 mg Oral BID

## 2015-04-26 NOTE — Progress Notes (Signed)
Inpatient Diabetes Program Recommendations  AACE/ADA: New Consensus Statement on Inpatient Glycemic Control (2015)  Target Ranges:  Prepandial:   less than 140 mg/dL      Peak postprandial:   less than 180 mg/dL (1-2 hours)      Critically ill patients:  140 - 180 mg/dL  Results for Jackie [name], Jackie [name] (MRN [id number]) as of 04/26/2015 11:04  Ref. Range 04/26/2015 06:35 04/26/2015 07:15 04/26/2015 08:07  Glucose-Capillary Latest Ref Range: 65-99 mg/dL 33 (LL) 57 (L) 89   Review of Glycemic Control  Inpatient Diabetes Program Recommendations:   Consider discontinuing Levemir secondary to HYPOglycemia Thank you  Piedad Climes BSN, RN,CDE Inpatient Diabetes Coordinator [phone] (team pager)

## 2015-04-26 NOTE — Clinical Social Work Note (Signed)
Clinical Social Work Assessment  Patient Details  Name: Jackie [name] MRN: [id number] Date of Birth: [birth year]  Date of referral:  04/26/15               Reason for consult:  Facility Placement, Discharge Planning                Permission sought to share information with:  Family Supports, Customer service manager, Case Optician, dispensing granted to share information::  Yes, Verbal Permission Granted  Name::      Jackie [name] or Office manager )  Agency::   ([facility] )  Relationship::   (Sons )  Contact Information:   [phone])  Housing/Transportation Living arrangements for the past 2 months:  [place] of Information:  Patient Patient Interpreter Needed:  None Criminal Activity/Legal Involvement Pertinent to Current Situation/Hospitalization:  No - Comment as needed Significant Relationships:  Adult Children Lives with:  Self Do you feel safe going back to the place where you live?  No Need for family participation in patient care:  No (Coment)  Care giving concerns:  Patient requiring continued therapy at SNF.   Social Worker assessment / plan:  Holiday representative met with patient at bedside in reference to SNF placement. CSW introduced CSW role and SNF process. CSW also reviewed and provided SNF list. Patient prefers [facility]. Patient stated she has never been to SNF before however looks forward to a great experience on the road to recovery. No further concerns reported at this time by patient. CSW has completed FL-2 and faxed clinicals to SNF's in [place] area. Patient has accepted bed offer from [facility]. [facility] met with patient at bedside to complete admissions paperwork as patient will likely be medically stable over the weekend. CSW will continue to follow pt and pt's family for continued support and to facilitate pt's discharge needs once medically stable.   Employment status:   Retired Forensic scientist:  Medicare PT Recommendations:  [place] / Referral to community resources:  [place]  Patient/Family's Response to care:  Pt sitting up in bed, alert and oriented x4. Pt smiling and agreeable to SNF placement at [facility]. Pt happy, pleasant and appreciated social work intervention.  Patient/Family's Understanding of and Emotional Response to Diagnosis, Current Treatment, and Prognosis: Pt knowledgeable of surgical intervention and SNF process. CSW remains available as needed.  Emotional Assessment Appearance:  Appears stated age Attitude/Demeanor/Rapport:   (Pleasant) Affect (typically observed):  Accepting, Appropriate, Pleasant, Hopeful, Happy Orientation:  Oriented to Self, Oriented to Place, Oriented to  Time, Oriented to Situation Alcohol / Substance use:  Not Applicable Psych involvement (Current and /or in the community):  No (Comment)  Discharge Needs  Concerns to be addressed:  Care Coordination Readmission within the last 30 days:  No Current discharge risk:  Lives alone, Dependent with Mobility Barriers to Discharge:  Continued Medical Work up   [employer], MSW, LCSWA [phone] 04/26/2015 3:19 PM

## 2015-04-26 NOTE — Progress Notes (Signed)
Subjective: Doing ok.  C/o of some phantom pain but not too bad.  States that she is being transferred to [facility] tomorrow.     Objective: Vital signs in last 24 hours: Temp:  [98 F (36.7 C)-100 F (37.8 C)] 98.5 F (36.9 C) (10/07 0515) Pulse Rate:  [75-94] 77 (10/07 0515) Resp:  [14-18] 18 (10/07 0515) BP: (118-176)/(52-84) 131/57 mmHg (10/07 0515) SpO2:  [95 %-100 %] 95 % (10/07 0515)  Intake/Output from previous day: 10/06 0701 - 10/07 0700 In: 480 [P.O.:480] Out: 1701 [Urine:1] Intake/Output this shift: Total I/O In: 240 [P.O.:240] Out: -    Recent Labs  04/24/15 0740 04/25/15 1617  HGB 9.2* 7.4*    Recent Labs  04/24/15 0740 04/25/15 1617  WBC 5.1 7.4  RBC 3.70* 3.02*  HCT 32.1* 25.7*  PLT 163 198    Recent Labs  04/24/15 0740 04/25/15 1617  NA 139 137  K 3.2* 3.3*  CL 97* 93*  CO2 31 29  BUN 13 24*  CREATININE 3.66* 5.35*  GLUCOSE 88 106*  CALCIUM 8.0* 8.2*    Recent Labs  04/24/15 0740  INR 1.12    Exam:  Alert and oriented.  Dressing c/d/i.    Assessment/Plan: Transfer to [facility] tomorrow if bed available and ok with nephrology.  F/u with Lajoyce Corners as scheduled.     Hiroki Wint M 04/26/2015, 1:34 PM

## 2015-04-27 DIAGNOSIS — E1152 Type 2 diabetes mellitus with diabetic peripheral angiopathy with gangrene: Secondary | ICD-10-CM | POA: Diagnosis not present

## 2015-04-27 LAB — RENAL FUNCTION PANEL
Albumin: 1.9 g/dL — ABNORMAL LOW (ref 3.5–5.0)
Anion gap: 7 (ref 5–15)
BUN: 28 mg/dL — AB (ref 6–20)
CHLORIDE: 97 mmol/L — AB (ref 101–111)
CO2: 28 mmol/L (ref 22–32)
CREATININE: 4.84 mg/dL — AB (ref 0.44–1.00)
Calcium: 8.1 mg/dL — ABNORMAL LOW (ref 8.9–10.3)
GFR calc non Af Amer: 8 mL/min — ABNORMAL LOW (ref >60)
GFR, EST AFRICAN AMERICAN: 9 mL/min — AB (ref >60)
GLUCOSE: 105 mg/dL — AB (ref 65–99)
Phosphorus: 3.3 mg/dL (ref 2.5–4.6)
Potassium: 4.2 mmol/L (ref 3.5–5.1)
Sodium: 132 mmol/L — ABNORMAL LOW (ref 135–145)

## 2015-04-27 LAB — GLUCOSE, CAPILLARY
Glucose-Capillary: 103 mg/dL — ABNORMAL HIGH (ref 65–99)
Glucose-Capillary: 123 mg/dL — ABNORMAL HIGH (ref 65–99)
Glucose-Capillary: 87 mg/dL (ref 65–99)

## 2015-04-27 LAB — CBC WITH DIFFERENTIAL/PLATELET
Basophils Absolute: 0 10*3/uL (ref 0.0–0.1)
Basophils Relative: 0 %
EOS PCT: 2 %
Eosinophils Absolute: 0.1 10*3/uL (ref 0.0–0.7)
HCT: 25.9 % — ABNORMAL LOW (ref 36.0–46.0)
Hemoglobin: 7.2 g/dL — ABNORMAL LOW (ref 12.0–15.0)
LYMPHS ABS: 1.4 10*3/uL (ref 0.7–4.0)
LYMPHS PCT: 18 %
MCH: 24.3 pg — AB (ref 26.0–34.0)
MCHC: 27.8 g/dL — AB (ref 30.0–36.0)
MCV: 87.5 fL (ref 78.0–100.0)
MONO ABS: 0.6 10*3/uL (ref 0.1–1.0)
MONOS PCT: 8 %
Neutro Abs: 5.5 10*3/uL (ref 1.7–7.7)
Neutrophils Relative %: 72 %
PLATELETS: 228 10*3/uL (ref 150–400)
RBC: 2.96 MIL/uL — ABNORMAL LOW (ref 3.87–5.11)
RDW: 19.5 % — AB (ref 11.5–15.5)
WBC: 7.6 10*3/uL (ref 4.0–10.5)

## 2015-04-27 LAB — PREPARE RBC (CROSSMATCH)

## 2015-04-27 MED ORDER — PENTAFLUOROPROP-TETRAFLUOROETH EX AERO
INHALATION_SPRAY | CUTANEOUS | Status: AC
  Administered 2015-04-27: 1 via TOPICAL
  Filled 2015-04-27: qty 103.5

## 2015-04-27 MED ORDER — OXYCODONE HCL 5 MG PO TABS
ORAL_TABLET | ORAL | Status: AC
  Filled 2015-04-27: qty 1

## 2015-04-27 MED ORDER — CALCITRIOL 0.5 MCG PO CAPS
ORAL_CAPSULE | ORAL | Status: AC
  Filled 2015-04-27: qty 3

## 2015-04-27 MED ORDER — SODIUM CHLORIDE 0.9 % IV SOLN: Freq: Once | INTRAVENOUS | Status: DC

## 2015-04-27 MED ORDER — PENTAFLUOROPROP-TETRAFLUOROETH EX AERO
INHALATION_SPRAY | CUTANEOUS | Status: AC
  Filled 2015-04-27: qty 103.5

## 2015-04-27 NOTE — Progress Notes (Signed)
Subjective:   No complaints- says she didn't get up at all yesterday  Objective Filed Vitals:   04/26/15 1503 04/26/15 2000 04/27/15 0446 04/27/15 0645  BP: 130/53 123/54 143/61 150/55  Pulse: 78 76 86 84  Temp: 98.6 F (37 C) 98.5 F (36.9 C) 99 F (37.2 C) 98.9 F (37.2 C)  TempSrc: Oral Oral Oral Oral  Resp: Height:      Weight:      SpO2: 100% 100% 95% 93%   Physical Exam General: resting in bed, no acute distress Heart: RRR Lungs: CTA, unlabored  Abdomen: soft, nontender +BS  Extremities: L BKA post op dressing. No edema  Dialysis Access:  L AVF patent on HD   Dialysis Orders: TTS [facility] 4 hrs 57kgs 2K/2Ca AVF 1800 u heparin venofer 100 x8- last dose 10/11 micera 200 q 2 weeks last dose given 9/22 Calcitriol 1.50    Assessment/Plan: 1. Ischemic ulcerations L foot- s/p L BKA -10/5 Dr Lajoyce Corners.. Pain controlled this AM 2. ESRD - TTS [facility], HD today. No heparin post op 3. Hypertension/volume -bp stable / will need new edw post amputation 4. Anemia - hgb 9.2 preop> 7.5 post op - cont Fe bolus for tsat 14 and ESA max 5. Metabolic bone disease - continune binders, sensipar and calcitriol- phos 4.4 corec ca 9.8 and PTH 569 6. Nutrition - renal/carb diet. alb 1.9 7. DM per perimary 8. dispo- Dc to SNF  Jetty Duhamel, NP [facility] Beeper [phone] 04/27/2015,9:39 AM  LOS: 3 days   Pt seen, examined and agree w A/P as above. HD today.  Vinson Moselle MD pager [phone]    cell [phone] 04/27/2015, 10:23 AM    Additional Objective Labs: Basic Metabolic Panel:  Recent Labs Lab 04/24/15 0740 04/25/15 1617 04/26/15 2220  NA 139 137 133*  K 3.2* 3.3* 4.1  CL 97* 93* 91*  CO2 31 29 33*  GLUCOSE 88 106* 173*  BUN 13 24* 24*  CREATININE 3.66* 5.35* 4.30*  CALCIUM 8.0* 8.2* 8.1*  PHOS  --  5.0* 4.4   Liver Function Tests:  Recent Labs Lab 04/24/15 0740 04/25/15 1617 04/26/15 2220  AST 20  --   --   ALT 9*  --   --    ALKPHOS 63  --   --   BILITOT 0.4  --   --   PROT 6.7  --   --   ALBUMIN 2.3* 2.1* 1.9*   No results for input(s): LIPASE, AMYLASE in the last 168 hours. CBC:  Recent Labs Lab 04/24/15 0740 04/25/15 1617 04/26/15 2220  WBC 5.1 7.4 7.3  HGB 9.2* 7.4* 7.5*  HCT 32.1* 25.7* 25.6*  MCV 86.8 85.1 88.0  PLT 163 198 197   Blood Culture    Component Value Date/Time   SDES WOUND 12/04/2014 1807   SPECREQUEST RIGHT FOOT 12/04/2014 1807   CULT  12/04/2014 1807    MULTIPLE ORGANISMS PRESENT, NONE PREDOMINANT Note: NO STAPHYLOCOCCUS AUREUS ISOLATED NO GROUP A STREP (S.PYOGENES) ISOLATED Performed at [facility]    REPTSTATUS 12/08/2014 FINAL 12/04/2014 1807    Cardiac Enzymes: No results for input(s): CKTOTAL, CKMB, CKMBINDEX, TROPONINI in the last 168 hours. CBG:  Recent Labs Lab 04/26/15 0807 04/26/15 1149 04/26/15 1629 04/26/15 2125 04/27/15 0650  GLUCAP 89 126* 118* 166* 103*   Iron Studies: No results for input(s): IRON, TIBC, TRANSFERRIN, FERRITIN in the last 72 hours. @ Studies/Results: No results found. Medications: . sodium  chloride 10 mL/hr at 04/24/15 1412   . aspirin EC  325 mg Oral Daily  . calcitRIOL  1.5 mcg Oral Q T,Th,Sa-HD  . calcium acetate  1,334 mg Oral TID WC  . cinacalcet  30 mg Oral Q supper  . darbepoetin (ARANESP) injection - DIALYSIS  200 mcg Intravenous Q Thu-HD  . ferric gluconate (FERRLECIT/NULECIT) IV  125 mg Intravenous Q T,Th,Sa-HD  . Influenza vac split quadrivalent PF  0.5 mL Intramuscular Tomorrow-1000  . insulin aspart  0-9 Units Subcutaneous TID WC  . insulin aspart  3 Units Subcutaneous TID WC  . insulin detemir  10 Units Subcutaneous QHS  . lanthanum  2,000 mg Oral TID WC  . multivitamin  1 tablet Oral QHS  . oxyCODONE      . saccharomyces boulardii  250 mg Oral BID

## 2015-04-27 NOTE — Progress Notes (Signed)
Social work indicated that the discharge orders needed to be revised by the MD.  Dr. Ophelia Charter, MD made aware, willing to discuss with SW.   MD unable to reach social worker, Astronomer.  Discharge process started once patient returned from dialysis late afternoon.  Attempt made to contact patient's son but unsuccessful to update him on the discharge delay.  Son to be contacted by Reuel Boom, RN regarding delay.  Will work in the morning to resolve issue with discharge orders.

## 2015-04-27 NOTE — Progress Notes (Signed)
Report given to Dialysis RN, Clydie Braun.  Patient currently sleep, will try to wake for breakfast.

## 2015-04-28 DIAGNOSIS — E039 Hypothyroidism, unspecified: Secondary | ICD-10-CM | POA: Diagnosis not present

## 2015-04-28 DIAGNOSIS — Z794 Long term (current) use of insulin: Secondary | ICD-10-CM | POA: Diagnosis not present

## 2015-04-28 DIAGNOSIS — D509 Iron deficiency anemia, unspecified: Secondary | ICD-10-CM | POA: Diagnosis not present

## 2015-04-28 DIAGNOSIS — I120 Hypertensive chronic kidney disease with stage 5 chronic kidney disease or end stage renal disease: Secondary | ICD-10-CM | POA: Diagnosis not present

## 2015-04-28 DIAGNOSIS — E1122 Type 2 diabetes mellitus with diabetic chronic kidney disease: Secondary | ICD-10-CM | POA: Diagnosis not present

## 2015-04-28 DIAGNOSIS — M25569 Pain in unspecified knee: Secondary | ICD-10-CM | POA: Diagnosis not present

## 2015-04-28 DIAGNOSIS — K5900 Constipation, unspecified: Secondary | ICD-10-CM | POA: Diagnosis not present

## 2015-04-28 DIAGNOSIS — R2681 Unsteadiness on feet: Secondary | ICD-10-CM | POA: Diagnosis not present

## 2015-04-28 DIAGNOSIS — Z992 Dependence on renal dialysis: Secondary | ICD-10-CM | POA: Diagnosis not present

## 2015-04-28 DIAGNOSIS — Z89512 Acquired absence of left leg below knee: Secondary | ICD-10-CM | POA: Diagnosis not present

## 2015-04-28 DIAGNOSIS — F4321 Adjustment disorder with depressed mood: Secondary | ICD-10-CM | POA: Diagnosis not present

## 2015-04-28 DIAGNOSIS — E785 Hyperlipidemia, unspecified: Secondary | ICD-10-CM | POA: Diagnosis not present

## 2015-04-28 DIAGNOSIS — F4320 Adjustment disorder, unspecified: Secondary | ICD-10-CM | POA: Diagnosis not present

## 2015-04-28 DIAGNOSIS — T82858D Stenosis of vascular prosthetic devices, implants and grafts, subsequent encounter: Secondary | ICD-10-CM | POA: Diagnosis not present

## 2015-04-28 DIAGNOSIS — N189 Chronic kidney disease, unspecified: Secondary | ICD-10-CM | POA: Diagnosis not present

## 2015-04-28 DIAGNOSIS — Z5189 Encounter for other specified aftercare: Secondary | ICD-10-CM | POA: Diagnosis not present

## 2015-04-28 DIAGNOSIS — E46 Unspecified protein-calorie malnutrition: Secondary | ICD-10-CM | POA: Diagnosis not present

## 2015-04-28 DIAGNOSIS — M6281 Muscle weakness (generalized): Secondary | ICD-10-CM | POA: Diagnosis not present

## 2015-04-28 DIAGNOSIS — E43 Unspecified severe protein-calorie malnutrition: Secondary | ICD-10-CM | POA: Diagnosis not present

## 2015-04-28 DIAGNOSIS — E119 Type 2 diabetes mellitus without complications: Secondary | ICD-10-CM | POA: Diagnosis not present

## 2015-04-28 DIAGNOSIS — I871 Compression of vein: Secondary | ICD-10-CM | POA: Diagnosis not present

## 2015-04-28 DIAGNOSIS — I739 Peripheral vascular disease, unspecified: Secondary | ICD-10-CM | POA: Diagnosis not present

## 2015-04-28 DIAGNOSIS — N186 End stage renal disease: Secondary | ICD-10-CM | POA: Diagnosis not present

## 2015-04-28 DIAGNOSIS — E1129 Type 2 diabetes mellitus with other diabetic kidney complication: Secondary | ICD-10-CM | POA: Diagnosis not present

## 2015-04-28 DIAGNOSIS — N2581 Secondary hyperparathyroidism of renal origin: Secondary | ICD-10-CM | POA: Diagnosis not present

## 2015-04-28 DIAGNOSIS — D631 Anemia in chronic kidney disease: Secondary | ICD-10-CM | POA: Diagnosis not present

## 2015-04-28 DIAGNOSIS — I10 Essential (primary) hypertension: Secondary | ICD-10-CM | POA: Diagnosis not present

## 2015-04-28 DIAGNOSIS — N184 Chronic kidney disease, stage 4 (severe): Secondary | ICD-10-CM | POA: Diagnosis not present

## 2015-04-28 LAB — TYPE AND SCREEN
ABO/RH(D): A POS
ANTIBODY SCREEN: NEGATIVE
UNIT DIVISION: 0

## 2015-04-28 LAB — GLUCOSE, CAPILLARY
GLUCOSE-CAPILLARY: 128 mg/dL — AB (ref 65–99)
GLUCOSE-CAPILLARY: 96 mg/dL (ref 65–99)

## 2015-04-28 NOTE — Social Work (Signed)
CSW contacted SNF about discharge summary. Admissions Coordinator identified that they can only accept a patient with a discharge summary with the current date or a note (in the chart) that states that there are no changes to the discharge summary. CSW will contact MD for follow up.  Beverly Sessions MSW, LCSW  [phone]

## 2015-04-28 NOTE — Care Management Note (Signed)
Case Management Note  Patient Details  Name: TORIN WHISNER MRN: [id number] Date of Birth: 1934-12-27  Subjective/Objective:                  s/p L BKA (04/24/15) due to chronic ischemic changes  Action/Plan: SW working with pt on placement to [facility]. CM remains available for further discharge planning needs.   Expected Discharge Date:  04/28/15               Expected Discharge Plan:  Skilled Nursing Facility  In-House Referral:  Clinical Social Work  Discharge planning Services  CM Consult  Post Acute Care Choice:    Choice offered to:     DME Arranged:    DME Agency:     HH Arranged:    HH Agency:     Status of Service:  In process, will continue to follow  Medicare Important Message Given:  Date Medicare IM Given:    Medicare IM give by:    Date Additional Medicare IM Given:    Additional Medicare Important Message give by:     If discussed at Long Length of Stay Meetings, dates discussed:    Additional Comments:  Darcel Smalling, RN 04/28/2015, 9:47 AM

## 2015-04-28 NOTE — Progress Notes (Signed)
Patient reported having a small BM yesterday.

## 2015-04-28 NOTE — Progress Notes (Signed)
Patient ID: Jackie [name], female   DOB: [birth year], 80 y.o.   MRN: [id number] Patient has a discharge summary done Friday and on chart. Rx on chart. No medical reason to remain in hospital. Social worker did not call for ride yesterday. Hopefully she can call today.

## 2015-04-28 NOTE — Progress Notes (Signed)
Subjective:   No complaints, waiting to see if the SNF will take her today  Objective Filed Vitals:   04/27/15 1333 04/27/15 1508 04/27/15 2011 04/28/15 0531  BP: 130/63 106/50 125/63 104/55  Pulse: 85 89 84 89  Temp:  99.6 F (37.6 C) 99.6 F (37.6 C) 98.4 F (36.9 C)  TempSrc:  Oral Oral Oral  Resp:  Height:      Weight:      SpO2:  100% 100% 100%   Physical Exam General: resting in bed, no acute distress Heart: RRR Lungs: CTA, unlabored  Abdomen: soft, nontender +BS  Extremities: L BKA post op dressing. No edema  Dialysis Access:  L AVF patent on HD   Dialysis Orders: TTS [facility] 4 hrs 57kgs 2K/2Ca AVF 1800 u heparin venofer 100 x8- last dose 10/11 micera 200 q 2 weeks last dose given 9/22 Calcitriol 1.50    Assessment/Plan: 1. Ischemic ulcerations L foot- s/p L BKA -10/5 Dr Lajoyce Corners.. Pain controlled 2. ESRD - TTS [facility] 3. Hypertension/volume -bp stable, will set new dry wt around 54kg 4. Anemia - hgb 9.2 preop> 7.5 post op - cont Fe bolus for tsat 14 and ESA max 5. Metabolic bone disease - continune binders, sensipar and calcitriol- phos 4.4 corec ca 9.8 and PTH 569 6. Nutrition - renal/carb diet. alb 1.9 7. DM per perimary 8. dispo- Dc to SNF   Jackie Moselle MD pager [phone]    cell [phone] 04/28/2015, 10:21 AM    Additional Objective Labs: Basic Metabolic Panel:  Recent Labs Lab 04/25/15 1617 04/26/15 2220 04/27/15 0942  NA 137 133* 132*  K 3.3* 4.1 4.2  CL 93* 91* 97*  CO2 29 33* 28  GLUCOSE 106* 173* 105*  BUN 24* 24* 28*  CREATININE 5.35* 4.30* 4.84*  CALCIUM 8.2* 8.1* 8.1*  PHOS 5.0* 4.4 3.3   Liver Function Tests:  Recent Labs Lab 04/24/15 0740 04/25/15 1617 04/26/15 2220 04/27/15 0942  AST 20  --   --   --   ALT 9*  --   --   --   ALKPHOS 63  --   --   --   BILITOT 0.4  --   --   --   PROT 6.7  --   --   --   ALBUMIN 2.3* 2.1* 1.9* 1.9*   No results for input(s): LIPASE, AMYLASE in the last 168  hours. CBC:  Recent Labs Lab 04/24/15 0740 04/25/15 1617 04/26/15 2220 04/27/15 0940  WBC 5.1 7.4 7.3 7.6  NEUTROABS  --   --   --  5.5  HGB 9.2* 7.4* 7.5* 7.2*  HCT 32.1* 25.7* 25.6* 25.9*  MCV 86.8 85.1 88.0 87.5  PLT 163 198 197 228   Blood Culture    Component Value Date/Time   SDES WOUND 12/04/2014 1807   SPECREQUEST RIGHT FOOT 12/04/2014 1807   CULT  12/04/2014 1807    MULTIPLE ORGANISMS PRESENT, NONE PREDOMINANT Note: NO STAPHYLOCOCCUS AUREUS ISOLATED NO GROUP A STREP (S.PYOGENES) ISOLATED Performed at [facility]    REPTSTATUS 12/08/2014 FINAL 12/04/2014 1807    Cardiac Enzymes: No results for input(s): CKTOTAL, CKMB, CKMBINDEX, TROPONINI in the last 168 hours. CBG:  Recent Labs Lab 04/26/15 2125 04/27/15 0650 04/27/15 1705 04/27/15 2130 04/28/15 0637  GLUCAP 166* 103* 123* 87 128*   Iron Studies: No results for input(s): IRON, TIBC, TRANSFERRIN, FERRITIN in the last 72 hours. @ Studies/Results: No results found. Medications: . sodium chloride  10 mL/hr at 04/24/15 1412   . sodium chloride   Intravenous Once  . aspirin EC  325 mg Oral Daily  . calcitRIOL  1.5 mcg Oral Q T,Th,Sa-HD  . calcium acetate  1,334 mg Oral TID WC  . cinacalcet  30 mg Oral Q supper  . darbepoetin (ARANESP) injection - DIALYSIS  200 mcg Intravenous Q Thu-HD  . ferric gluconate (FERRLECIT/NULECIT) IV  125 mg Intravenous Q T,Th,Sa-HD  . insulin aspart  0-9 Units Subcutaneous TID WC  . insulin aspart  3 Units Subcutaneous TID WC  . insulin detemir  10 Units Subcutaneous QHS  . lanthanum  2,000 mg Oral TID WC  . multivitamin  1 tablet Oral QHS  . saccharomyces boulardii  250 mg Oral BID

## 2015-04-28 NOTE — Progress Notes (Signed)
I have been following the patient all week for the renal service and she has been clinically stable and unchanged over the last several days since her BKA.  Particularly there has been no clinical change since 10/6, the date of the dictated d/c summary done by the orthopedic service.    Vinson Moselle MD [facility] pager [phone]    cell [phone] 04/28/2015, 10:31 AM

## 2015-04-28 NOTE — Progress Notes (Signed)
Patient discharged to SNF, [facility].  Son, Reuel Boom present and son, Freida Busman called prior to transport.  Report called to [place] at [facility].  Prescription and AVS provided with patient packet, per request of facility.  IV removed.  Patient's belongings with patient.  Family and patient denied questions.

## 2015-04-28 NOTE — Progress Notes (Signed)
Social Worker Tijuan to follow up with facility this am regarding discharge.

## 2015-04-29 NOTE — Clinical Social Work Note (Signed)
LATE ENTRY: Clinical Social Worker has attempted several times to contact MD in reference to updated discharge summary and orders. Facility [facility]) will NOT accept patient without updated discharge summary indicating that there has NOT been any changes.   Information has also been related to bedside RN who has also paged the on-call service. Facility will NOT accept patient without updated discharge summary.DC packet has been prepared for following weekend CSW to follow up on Sunday.   FL-2 signed and on chart.  Derenda Fennel, MSW, LCSWA [phone] 04/29/2015 8:17 AM

## 2015-04-30 DIAGNOSIS — E1129 Type 2 diabetes mellitus with other diabetic kidney complication: Secondary | ICD-10-CM | POA: Diagnosis not present

## 2015-04-30 DIAGNOSIS — D509 Iron deficiency anemia, unspecified: Secondary | ICD-10-CM | POA: Diagnosis not present

## 2015-04-30 DIAGNOSIS — D631 Anemia in chronic kidney disease: Secondary | ICD-10-CM | POA: Diagnosis not present

## 2015-04-30 DIAGNOSIS — N186 End stage renal disease: Secondary | ICD-10-CM | POA: Diagnosis not present

## 2015-04-30 DIAGNOSIS — N2581 Secondary hyperparathyroidism of renal origin: Secondary | ICD-10-CM | POA: Diagnosis not present

## 2015-05-01 DIAGNOSIS — N186 End stage renal disease: Secondary | ICD-10-CM | POA: Diagnosis not present

## 2015-05-01 DIAGNOSIS — Z992 Dependence on renal dialysis: Secondary | ICD-10-CM | POA: Diagnosis not present

## 2015-05-01 DIAGNOSIS — I871 Compression of vein: Secondary | ICD-10-CM | POA: Diagnosis not present

## 2015-05-01 DIAGNOSIS — T82858D Stenosis of vascular prosthetic devices, implants and grafts, subsequent encounter: Secondary | ICD-10-CM | POA: Diagnosis not present

## 2015-05-02 DIAGNOSIS — N186 End stage renal disease: Secondary | ICD-10-CM | POA: Diagnosis not present

## 2015-05-02 DIAGNOSIS — D631 Anemia in chronic kidney disease: Secondary | ICD-10-CM | POA: Diagnosis not present

## 2015-05-02 DIAGNOSIS — D509 Iron deficiency anemia, unspecified: Secondary | ICD-10-CM | POA: Diagnosis not present

## 2015-05-02 DIAGNOSIS — E1129 Type 2 diabetes mellitus with other diabetic kidney complication: Secondary | ICD-10-CM | POA: Diagnosis not present

## 2015-05-02 DIAGNOSIS — N2581 Secondary hyperparathyroidism of renal origin: Secondary | ICD-10-CM | POA: Diagnosis not present

## 2015-05-03 ENCOUNTER — Non-Acute Institutional Stay (SKILLED_NURSING_FACILITY): Payer: Medicare Other | Admitting: Internal Medicine

## 2015-05-03 DIAGNOSIS — N184 Chronic kidney disease, stage 4 (severe): Secondary | ICD-10-CM

## 2015-05-03 DIAGNOSIS — Z89512 Acquired absence of left leg below knee: Secondary | ICD-10-CM | POA: Diagnosis not present

## 2015-05-03 DIAGNOSIS — N186 End stage renal disease: Secondary | ICD-10-CM | POA: Diagnosis not present

## 2015-05-03 DIAGNOSIS — F4321 Adjustment disorder with depressed mood: Secondary | ICD-10-CM | POA: Diagnosis not present

## 2015-05-03 DIAGNOSIS — N189 Chronic kidney disease, unspecified: Secondary | ICD-10-CM | POA: Diagnosis not present

## 2015-05-03 DIAGNOSIS — Z794 Long term (current) use of insulin: Secondary | ICD-10-CM

## 2015-05-03 DIAGNOSIS — R2681 Unsteadiness on feet: Secondary | ICD-10-CM

## 2015-05-03 DIAGNOSIS — D631 Anemia in chronic kidney disease: Secondary | ICD-10-CM | POA: Diagnosis not present

## 2015-05-03 DIAGNOSIS — E1122 Type 2 diabetes mellitus with diabetic chronic kidney disease: Secondary | ICD-10-CM

## 2015-05-03 DIAGNOSIS — K5900 Constipation, unspecified: Secondary | ICD-10-CM | POA: Diagnosis not present

## 2015-05-03 DIAGNOSIS — E46 Unspecified protein-calorie malnutrition: Secondary | ICD-10-CM

## 2015-05-04 DIAGNOSIS — E1129 Type 2 diabetes mellitus with other diabetic kidney complication: Secondary | ICD-10-CM | POA: Diagnosis not present

## 2015-05-04 DIAGNOSIS — N2581 Secondary hyperparathyroidism of renal origin: Secondary | ICD-10-CM | POA: Diagnosis not present

## 2015-05-04 DIAGNOSIS — D631 Anemia in chronic kidney disease: Secondary | ICD-10-CM | POA: Diagnosis not present

## 2015-05-04 DIAGNOSIS — N186 End stage renal disease: Secondary | ICD-10-CM | POA: Diagnosis not present

## 2015-05-04 DIAGNOSIS — D509 Iron deficiency anemia, unspecified: Secondary | ICD-10-CM | POA: Diagnosis not present

## 2015-05-07 DIAGNOSIS — D509 Iron deficiency anemia, unspecified: Secondary | ICD-10-CM | POA: Diagnosis not present

## 2015-05-07 DIAGNOSIS — E1129 Type 2 diabetes mellitus with other diabetic kidney complication: Secondary | ICD-10-CM | POA: Diagnosis not present

## 2015-05-07 DIAGNOSIS — N186 End stage renal disease: Secondary | ICD-10-CM | POA: Diagnosis not present

## 2015-05-07 DIAGNOSIS — D631 Anemia in chronic kidney disease: Secondary | ICD-10-CM | POA: Diagnosis not present

## 2015-05-07 DIAGNOSIS — N2581 Secondary hyperparathyroidism of renal origin: Secondary | ICD-10-CM | POA: Diagnosis not present

## 2015-05-09 DIAGNOSIS — N186 End stage renal disease: Secondary | ICD-10-CM | POA: Diagnosis not present

## 2015-05-09 DIAGNOSIS — E1129 Type 2 diabetes mellitus with other diabetic kidney complication: Secondary | ICD-10-CM | POA: Diagnosis not present

## 2015-05-09 DIAGNOSIS — D631 Anemia in chronic kidney disease: Secondary | ICD-10-CM | POA: Diagnosis not present

## 2015-05-09 DIAGNOSIS — N2581 Secondary hyperparathyroidism of renal origin: Secondary | ICD-10-CM | POA: Diagnosis not present

## 2015-05-09 DIAGNOSIS — D509 Iron deficiency anemia, unspecified: Secondary | ICD-10-CM | POA: Diagnosis not present

## 2015-05-11 DIAGNOSIS — N2581 Secondary hyperparathyroidism of renal origin: Secondary | ICD-10-CM | POA: Diagnosis not present

## 2015-05-11 DIAGNOSIS — D509 Iron deficiency anemia, unspecified: Secondary | ICD-10-CM | POA: Diagnosis not present

## 2015-05-11 DIAGNOSIS — E1129 Type 2 diabetes mellitus with other diabetic kidney complication: Secondary | ICD-10-CM | POA: Diagnosis not present

## 2015-05-11 DIAGNOSIS — N186 End stage renal disease: Secondary | ICD-10-CM | POA: Diagnosis not present

## 2015-05-11 DIAGNOSIS — D631 Anemia in chronic kidney disease: Secondary | ICD-10-CM | POA: Diagnosis not present

## 2015-05-14 DIAGNOSIS — D509 Iron deficiency anemia, unspecified: Secondary | ICD-10-CM | POA: Diagnosis not present

## 2015-05-14 DIAGNOSIS — E1129 Type 2 diabetes mellitus with other diabetic kidney complication: Secondary | ICD-10-CM | POA: Diagnosis not present

## 2015-05-14 DIAGNOSIS — N2581 Secondary hyperparathyroidism of renal origin: Secondary | ICD-10-CM | POA: Diagnosis not present

## 2015-05-14 DIAGNOSIS — N186 End stage renal disease: Secondary | ICD-10-CM | POA: Diagnosis not present

## 2015-05-14 DIAGNOSIS — D631 Anemia in chronic kidney disease: Secondary | ICD-10-CM | POA: Diagnosis not present

## 2015-05-15 ENCOUNTER — Encounter: Payer: Self-pay | Admitting: *Deleted

## 2015-05-16 DIAGNOSIS — N186 End stage renal disease: Secondary | ICD-10-CM | POA: Diagnosis not present

## 2015-05-16 DIAGNOSIS — N2581 Secondary hyperparathyroidism of renal origin: Secondary | ICD-10-CM | POA: Diagnosis not present

## 2015-05-16 DIAGNOSIS — D509 Iron deficiency anemia, unspecified: Secondary | ICD-10-CM | POA: Diagnosis not present

## 2015-05-16 DIAGNOSIS — E1129 Type 2 diabetes mellitus with other diabetic kidney complication: Secondary | ICD-10-CM | POA: Diagnosis not present

## 2015-05-16 DIAGNOSIS — D631 Anemia in chronic kidney disease: Secondary | ICD-10-CM | POA: Diagnosis not present

## 2015-05-17 ENCOUNTER — Non-Acute Institutional Stay (SKILLED_NURSING_FACILITY): Payer: Medicare Other | Admitting: Adult Health

## 2015-05-17 ENCOUNTER — Encounter: Payer: Self-pay | Admitting: Adult Health

## 2015-05-17 DIAGNOSIS — Z89512 Acquired absence of left leg below knee: Secondary | ICD-10-CM

## 2015-05-17 DIAGNOSIS — E43 Unspecified severe protein-calorie malnutrition: Secondary | ICD-10-CM

## 2015-05-17 DIAGNOSIS — N189 Chronic kidney disease, unspecified: Secondary | ICD-10-CM

## 2015-05-17 DIAGNOSIS — N186 End stage renal disease: Secondary | ICD-10-CM | POA: Diagnosis not present

## 2015-05-17 DIAGNOSIS — E1129 Type 2 diabetes mellitus with other diabetic kidney complication: Secondary | ICD-10-CM | POA: Diagnosis not present

## 2015-05-17 DIAGNOSIS — K5900 Constipation, unspecified: Secondary | ICD-10-CM | POA: Diagnosis not present

## 2015-05-17 DIAGNOSIS — D631 Anemia in chronic kidney disease: Secondary | ICD-10-CM | POA: Diagnosis not present

## 2015-05-17 NOTE — Progress Notes (Signed)
Patient ID: Jackie [name], female   DOB: 1934-12-26, 80 y.o.   MRN: [id number]    DATE:  05/17/2015   MRN:  [id number]  BIRTHDAY: 27-Nov-1934  Facility:  Nursing Home Location:  [facility]  Nursing Home Room Number: 408-P  LEVEL OF CARE:  SNF (31)  Contact Information    Name Relation Home Work Mobile   [place] Son [phone]     Janith, Nielson [phone]         Chief Complaint  Patient presents with  . Discharge Note    S/P left trans-GBL amputation due to left foot gangrene, ESRD, diabetes mellitus, anemia, constipation and protein calorie malnutrition    HISTORY OF PRESENT ILLNESS:   This is a 80 year old female who is for discharge home with home health PT, OT and Nursing. DME: Standard wheelchair 16" X 18", elevating leg rests, anti-tippers, wheelchair cushion, 3 in 1 bedside commode and extended tub bench. She has been admitted to [facility] on 04/28/15 from [facility]. She has PMH of hypertension, diabetes mellitus, thyroid disease, anemia, hyperlipidemia, arthritis, pneumonia and ESRD. She had ischemic ulceration of the left foot without revascularization options. She had left transtibial amputation on 04/24/15.  Patient was admitted to this facility for short-term rehabilitation after the patient's recent hospitalization.  Patient has completed SNF rehabilitation and therapy has cleared the patient for discharge.   PAST MEDICAL HISTORY:  Past Medical History  Diagnosis Date  . Hypertension   . Diabetes mellitus   . Thyroid disease     pt denies this  . Anemia   . Hyperlipidemia   . Arthritis   . Pneumonia   . End-stage renal disease on hemodialysis (HCC)     dialysis T/Th/Sa     CURRENT MEDICATIONS: Reviewed  Patient's Medications  New Prescriptions   No medications on file  Previous Medications   ALBUTEROL (PROVENTIL HFA;VENTOLIN HFA) 108 (90 BASE) MCG/ACT INHALER    Inhale 1-2 puffs into the lungs every 6 (six)  hours as needed for wheezing.   B COMPLEX-VITAMIN C-FOLIC ACID (NEPHRO-VITE) 0.8 MG TABS TABLET    Take 1 tablet by mouth daily.   CALCITRIOL (ROCALTROL) 0.5 MCG CAPSULE    Take 2 capsules (1 mcg total) by mouth Every Tuesday,Thursday,and Saturday with dialysis.   CALCIUM ACETATE (PHOSLO) 667 MG CAPSULE    Take 2 capsules by mouth 3 (three) times daily with meals.   CEFTAZIDIME 2 G IN DEXTROSE 5 % 50 ML    Inject 2 g into the vein Every Tuesday,Thursday,and Saturday with dialysis.   CINACALCET (SENSIPAR) 30 MG TABLET    Take 30 mg by mouth daily.   DOCUSATE SODIUM (COLACE) 100 MG CAPSULE    Take one by mouth twice daily for constipation. Hold for loose stools   FOSRENOL 1000 MG CHEWABLE TABLET    Chew 2,000 mg by mouth 3 (three) times daily with meals.    INSULIN DETEMIR (LEVEMIR) 100 UNIT/ML INJECTION    Inject 0.1 mLs (10 Units total) into the skin at bedtime.   OXYCODONE-ACETAMINOPHEN (ROXICET) 5-325 MG TABLET    Take 1 tablet by mouth every 4 (four) hours as needed for severe pain.   PROTEIN (PROCEL PO)    Two scoops twice daily   SACCHAROMYCES BOULARDII (FLORASTOR) 250 MG CAPSULE    Take 1 capsule (250 mg total) by mouth 2 (two) times daily.   VANCOMYCIN 500 MG IN SODIUM CHLORIDE 0.9 % 100 ML    Inject  500 mg into the vein Every Tuesday,Thursday,and Saturday with dialysis.  Modified Medications   No medications on file  Discontinued Medications   No medications on file     No Known Allergies   REVIEW OF SYSTEMS:  GENERAL: no change in appetite, no fatigue, no weight changes, no fever, chills or weakness EYES: Denies change in vision, dry eyes, eye pain, itching or discharge EARS: Denies change in hearing, ringing in ears, or earache NOSE: Denies nasal congestion or epistaxis MOUTH and THROAT: Denies oral discomfort, gingival pain or bleeding, pain from teeth or hoarseness   RESPIRATORY: no cough, SOB, DOE, wheezing, hemoptysis CARDIAC: no chest pain, edema or palpitations GI: no  abdominal pain, diarrhea, constipation, heart burn, nausea or vomiting GU: Denies dysuria, frequency, hematuria, incontinence, or discharge PSYCHIATRIC: Denies feeling of depression or anxiety. No report of hallucinations, insomnia, paranoia, or agitation   PHYSICAL EXAMINATION  GENERAL APPEARANCE: Well nourished. In no acute distress. Normal body habitus SKIN:  Left BKA is covered with dressing, dry HEAD: Normal in size and contour. No evidence of trauma EYES: Lids open and close normally. No blepharitis, entropion or ectropion. PERRL. Conjunctivae are clear and sclerae are white. Lenses are without opacity EARS: Pinnae are normal. Patient hears normal voice tunes of the examiner MOUTH and THROAT: Lips are without lesions. Oral mucosa is moist and without lesions. Tongue is normal in shape, size, and color and without lesions NECK: supple, trachea midline, no neck masses, no thyroid tenderness, no thyromegaly LYMPHATICS: no LAN in the neck, no supraclavicular LAN RESPIRATORY: breathing is even & unlabored, BS CTAB CARDIAC: RRR, no murmur,no extra heart sounds, no edema GI: abdomen soft, normal BS, no masses, no tenderness, no hepatomegaly, no splenomegaly EXTREMITIES:  Able to move 4 extremities , left BKA , left upper arm AV fistula +thrill PSYCHIATRIC: Alert and oriented X 3. Affect and behavior are appropriate  LABS/RADIOLOGY: Labs reviewed: Basic Metabolic Panel:  Recent Labs  40/98/11 0430  04/25/15 1617 04/26/15 2220 04/27/15 0942  NA 138  < > 137 133* 132*  K 3.3*  < > 3.3* 4.1 4.2  CL 96*  < > 93* 91* 97*  CO2 32  < > 29 33* 28  GLUCOSE 145*  < > 106* 173* 105*  BUN 14  < > 24* 24* 28*  CREATININE 3.90*  < > 5.35* 4.30* 4.84*  CALCIUM 8.3*  < > 8.2* 8.1* 8.1*  MG 2.1  --   --   --   --   PHOS 4.3  --  5.0* 4.4 3.3  < > = values in this interval not displayed. Liver Function Tests:  Recent Labs  12/04/14 1810 12/05/14 0430 04/24/15 0740 04/25/15 1617  04/26/15 2220 04/27/15 0942  AST --   --   --   ALT 10* 8* 9*  --   --   --   ALKPHOS 67 52 63  --   --   --   BILITOT 0.5 0.6 0.4  --   --   --   PROT 8.5* 6.3* 6.7  --   --   --   ALBUMIN 2.9* 2.2* 2.3* 2.1* 1.9* 1.9*   CBC:  Recent Labs  12/04/14 1810  04/25/15 1617 04/26/15 2220 04/27/15 0940  WBC 11.1*  < > 7.4 7.3 7.6  NEUTROABS 8.3*  --   --   --  5.5  HGB 9.7*  < > 7.4* 7.5* 7.2*  HCT 31.4*  < >  25.7* 25.6* 25.9*  MCV 93.5  < > 85.1 88.0 87.5  PLT 297  < > 198 197 228  < > = values in this interval not displayed.  Cardiac Enzymes:  Recent Labs  12/04/14 2201 12/05/14 0430 12/05/14 0916  TROPONINI 0.03 0.03 0.03   CBG:  Recent Labs  04/27/15 2130 04/28/15 0637 04/28/15 1137  GLUCAP 87 128* 96    Dg Chest 2 View  04/24/2015  CLINICAL DATA:  Gangrene of foot. EXAM: CHEST  2 VIEW COMPARISON:  January 16, 2015. FINDINGS: Stable cardiomegaly. No pneumothorax or pleural effusion is noted. No acute pulmonary disease is noted. Vascular stents are noted in the right axillary region. Bony thorax is unremarkable. IMPRESSION: No active cardiopulmonary disease. Electronically Signed   By: Lupita RaiderJames  Green Jr, M.D.   On: 04/24/2015 07:54    ASSESSMENT/PLAN:  S/P left trans-Tibial amputation due to left foot gangrene - for home health PT, OT and Nursing; continue ceftazidime 2 g IV every TThS with dialysis, vancomycin 500 mg IV Q TThS with dialysis, Percocet 5/325 mg 1 tab by mouth every 4 hours when necessary for pain and flora start 250 mg by mouth twice a day   ESRD - PhosLo 667 mg 2 capsules by mouth 3 times a day with meals, Fosrenol 1000 mg chew 2 tabs = 2000 mg by mouth 3 times a day with meals and Sensipar 30 mg 1 tab by mouth daily at bedtime and Calcitriol 0.5 g 2 capsules by mouth every TThS with dialysis  Diabetes mellitus, type II - hemoglobin A1c 6.3; continue Levemir 10 units subcutaneous daily at bedtime  Anemia, acute blood loss and chronic kidney  disease - hemoglobin 7.2; continue iron 325 mg 1 tab by mouth 3 times/week  Constipation - continue Colace 100 mg 1 capsule by mouth twice a day  Protein calorie malnutrition, severe - albumin Green 1.9; continue Procel 2 scoops by mouth twice a day      I have filled out patient's discharge paperwork and written prescriptions.  Patient will receive home health PT, OT and Nursing.  DME provided:  Standard wheelchair 16" X 18", elevating leg rests, anti-tippers, wheelchair cushion, 3 in 1 bedside commode and extended tub bench  Total discharge time: Greater than 30 minutes  Discharge time involved coordination of the discharge process with social worker, nursing staff and therapy department. Medical justification for home health services/DME verified.   [facility] InstituteMEDINA-VARGAS,Sheddrick Lattanzio, NP [facility] [phone]

## 2015-05-18 DIAGNOSIS — N2581 Secondary hyperparathyroidism of renal origin: Secondary | ICD-10-CM | POA: Diagnosis not present

## 2015-05-18 DIAGNOSIS — E1129 Type 2 diabetes mellitus with other diabetic kidney complication: Secondary | ICD-10-CM | POA: Diagnosis not present

## 2015-05-18 DIAGNOSIS — D631 Anemia in chronic kidney disease: Secondary | ICD-10-CM | POA: Diagnosis not present

## 2015-05-18 DIAGNOSIS — N186 End stage renal disease: Secondary | ICD-10-CM | POA: Diagnosis not present

## 2015-05-18 DIAGNOSIS — D509 Iron deficiency anemia, unspecified: Secondary | ICD-10-CM | POA: Diagnosis not present

## 2015-05-19 DIAGNOSIS — D649 Anemia, unspecified: Secondary | ICD-10-CM | POA: Diagnosis not present

## 2015-05-19 DIAGNOSIS — I120 Hypertensive chronic kidney disease with stage 5 chronic kidney disease or end stage renal disease: Secondary | ICD-10-CM | POA: Diagnosis not present

## 2015-05-19 DIAGNOSIS — L97522 Non-pressure chronic ulcer of other part of left foot with fat layer exposed: Secondary | ICD-10-CM | POA: Diagnosis not present

## 2015-05-19 DIAGNOSIS — N186 End stage renal disease: Secondary | ICD-10-CM | POA: Diagnosis not present

## 2015-05-19 DIAGNOSIS — E11621 Type 2 diabetes mellitus with foot ulcer: Secondary | ICD-10-CM | POA: Diagnosis not present

## 2015-05-19 DIAGNOSIS — E1122 Type 2 diabetes mellitus with diabetic chronic kidney disease: Secondary | ICD-10-CM | POA: Diagnosis not present

## 2015-05-20 DIAGNOSIS — Z992 Dependence on renal dialysis: Secondary | ICD-10-CM | POA: Diagnosis not present

## 2015-05-20 DIAGNOSIS — N186 End stage renal disease: Secondary | ICD-10-CM | POA: Diagnosis not present

## 2015-05-20 DIAGNOSIS — E1129 Type 2 diabetes mellitus with other diabetic kidney complication: Secondary | ICD-10-CM | POA: Diagnosis not present

## 2015-05-21 DIAGNOSIS — N2581 Secondary hyperparathyroidism of renal origin: Secondary | ICD-10-CM | POA: Diagnosis not present

## 2015-05-21 DIAGNOSIS — E1129 Type 2 diabetes mellitus with other diabetic kidney complication: Secondary | ICD-10-CM | POA: Diagnosis not present

## 2015-05-21 DIAGNOSIS — D509 Iron deficiency anemia, unspecified: Secondary | ICD-10-CM | POA: Diagnosis not present

## 2015-05-21 DIAGNOSIS — D631 Anemia in chronic kidney disease: Secondary | ICD-10-CM | POA: Diagnosis not present

## 2015-05-21 DIAGNOSIS — N186 End stage renal disease: Secondary | ICD-10-CM | POA: Diagnosis not present

## 2015-05-22 DIAGNOSIS — I120 Hypertensive chronic kidney disease with stage 5 chronic kidney disease or end stage renal disease: Secondary | ICD-10-CM | POA: Diagnosis not present

## 2015-05-22 DIAGNOSIS — E11621 Type 2 diabetes mellitus with foot ulcer: Secondary | ICD-10-CM | POA: Diagnosis not present

## 2015-05-22 DIAGNOSIS — E1122 Type 2 diabetes mellitus with diabetic chronic kidney disease: Secondary | ICD-10-CM | POA: Diagnosis not present

## 2015-05-22 DIAGNOSIS — D649 Anemia, unspecified: Secondary | ICD-10-CM | POA: Diagnosis not present

## 2015-05-22 DIAGNOSIS — L97522 Non-pressure chronic ulcer of other part of left foot with fat layer exposed: Secondary | ICD-10-CM | POA: Diagnosis not present

## 2015-05-22 DIAGNOSIS — N186 End stage renal disease: Secondary | ICD-10-CM | POA: Diagnosis not present

## 2015-05-23 DIAGNOSIS — N186 End stage renal disease: Secondary | ICD-10-CM | POA: Diagnosis not present

## 2015-05-23 DIAGNOSIS — D631 Anemia in chronic kidney disease: Secondary | ICD-10-CM | POA: Diagnosis not present

## 2015-05-23 DIAGNOSIS — E1129 Type 2 diabetes mellitus with other diabetic kidney complication: Secondary | ICD-10-CM | POA: Diagnosis not present

## 2015-05-23 DIAGNOSIS — D509 Iron deficiency anemia, unspecified: Secondary | ICD-10-CM | POA: Diagnosis not present

## 2015-05-23 DIAGNOSIS — N2581 Secondary hyperparathyroidism of renal origin: Secondary | ICD-10-CM | POA: Diagnosis not present

## 2015-05-24 DIAGNOSIS — N186 End stage renal disease: Secondary | ICD-10-CM | POA: Diagnosis not present

## 2015-05-24 DIAGNOSIS — E1122 Type 2 diabetes mellitus with diabetic chronic kidney disease: Secondary | ICD-10-CM | POA: Diagnosis not present

## 2015-05-24 DIAGNOSIS — D649 Anemia, unspecified: Secondary | ICD-10-CM | POA: Diagnosis not present

## 2015-05-24 DIAGNOSIS — E11621 Type 2 diabetes mellitus with foot ulcer: Secondary | ICD-10-CM | POA: Diagnosis not present

## 2015-05-24 DIAGNOSIS — I120 Hypertensive chronic kidney disease with stage 5 chronic kidney disease or end stage renal disease: Secondary | ICD-10-CM | POA: Diagnosis not present

## 2015-05-24 DIAGNOSIS — L97522 Non-pressure chronic ulcer of other part of left foot with fat layer exposed: Secondary | ICD-10-CM | POA: Diagnosis not present

## 2015-05-25 DIAGNOSIS — D509 Iron deficiency anemia, unspecified: Secondary | ICD-10-CM | POA: Diagnosis not present

## 2015-05-25 DIAGNOSIS — E1129 Type 2 diabetes mellitus with other diabetic kidney complication: Secondary | ICD-10-CM | POA: Diagnosis not present

## 2015-05-25 DIAGNOSIS — N186 End stage renal disease: Secondary | ICD-10-CM | POA: Diagnosis not present

## 2015-05-25 DIAGNOSIS — N2581 Secondary hyperparathyroidism of renal origin: Secondary | ICD-10-CM | POA: Diagnosis not present

## 2015-05-25 DIAGNOSIS — D631 Anemia in chronic kidney disease: Secondary | ICD-10-CM | POA: Diagnosis not present

## 2015-05-27 DIAGNOSIS — L97522 Non-pressure chronic ulcer of other part of left foot with fat layer exposed: Secondary | ICD-10-CM | POA: Diagnosis not present

## 2015-05-27 DIAGNOSIS — I120 Hypertensive chronic kidney disease with stage 5 chronic kidney disease or end stage renal disease: Secondary | ICD-10-CM | POA: Diagnosis not present

## 2015-05-27 DIAGNOSIS — E1122 Type 2 diabetes mellitus with diabetic chronic kidney disease: Secondary | ICD-10-CM | POA: Diagnosis not present

## 2015-05-27 DIAGNOSIS — E11621 Type 2 diabetes mellitus with foot ulcer: Secondary | ICD-10-CM | POA: Diagnosis not present

## 2015-05-27 DIAGNOSIS — D649 Anemia, unspecified: Secondary | ICD-10-CM | POA: Diagnosis not present

## 2015-05-27 DIAGNOSIS — N186 End stage renal disease: Secondary | ICD-10-CM | POA: Diagnosis not present

## 2015-05-28 DIAGNOSIS — E1129 Type 2 diabetes mellitus with other diabetic kidney complication: Secondary | ICD-10-CM | POA: Diagnosis not present

## 2015-05-28 DIAGNOSIS — D631 Anemia in chronic kidney disease: Secondary | ICD-10-CM | POA: Diagnosis not present

## 2015-05-28 DIAGNOSIS — D509 Iron deficiency anemia, unspecified: Secondary | ICD-10-CM | POA: Diagnosis not present

## 2015-05-28 DIAGNOSIS — N186 End stage renal disease: Secondary | ICD-10-CM | POA: Diagnosis not present

## 2015-05-28 DIAGNOSIS — N2581 Secondary hyperparathyroidism of renal origin: Secondary | ICD-10-CM | POA: Diagnosis not present

## 2015-05-29 DIAGNOSIS — E1122 Type 2 diabetes mellitus with diabetic chronic kidney disease: Secondary | ICD-10-CM | POA: Diagnosis not present

## 2015-05-29 DIAGNOSIS — I10 Essential (primary) hypertension: Secondary | ICD-10-CM | POA: Diagnosis not present

## 2015-05-29 DIAGNOSIS — N186 End stage renal disease: Secondary | ICD-10-CM | POA: Diagnosis not present

## 2015-05-30 DIAGNOSIS — D631 Anemia in chronic kidney disease: Secondary | ICD-10-CM | POA: Diagnosis not present

## 2015-05-30 DIAGNOSIS — D509 Iron deficiency anemia, unspecified: Secondary | ICD-10-CM | POA: Diagnosis not present

## 2015-05-30 DIAGNOSIS — E1129 Type 2 diabetes mellitus with other diabetic kidney complication: Secondary | ICD-10-CM | POA: Diagnosis not present

## 2015-05-30 DIAGNOSIS — N186 End stage renal disease: Secondary | ICD-10-CM | POA: Diagnosis not present

## 2015-05-30 DIAGNOSIS — N2581 Secondary hyperparathyroidism of renal origin: Secondary | ICD-10-CM | POA: Diagnosis not present

## 2015-05-31 DIAGNOSIS — E1122 Type 2 diabetes mellitus with diabetic chronic kidney disease: Secondary | ICD-10-CM | POA: Diagnosis not present

## 2015-05-31 DIAGNOSIS — D649 Anemia, unspecified: Secondary | ICD-10-CM | POA: Diagnosis not present

## 2015-05-31 DIAGNOSIS — E11621 Type 2 diabetes mellitus with foot ulcer: Secondary | ICD-10-CM | POA: Diagnosis not present

## 2015-05-31 DIAGNOSIS — L97522 Non-pressure chronic ulcer of other part of left foot with fat layer exposed: Secondary | ICD-10-CM | POA: Diagnosis not present

## 2015-05-31 DIAGNOSIS — I120 Hypertensive chronic kidney disease with stage 5 chronic kidney disease or end stage renal disease: Secondary | ICD-10-CM | POA: Diagnosis not present

## 2015-05-31 DIAGNOSIS — N186 End stage renal disease: Secondary | ICD-10-CM | POA: Diagnosis not present

## 2015-06-01 DIAGNOSIS — D509 Iron deficiency anemia, unspecified: Secondary | ICD-10-CM | POA: Diagnosis not present

## 2015-06-01 DIAGNOSIS — D631 Anemia in chronic kidney disease: Secondary | ICD-10-CM | POA: Diagnosis not present

## 2015-06-01 DIAGNOSIS — N186 End stage renal disease: Secondary | ICD-10-CM | POA: Diagnosis not present

## 2015-06-01 DIAGNOSIS — N2581 Secondary hyperparathyroidism of renal origin: Secondary | ICD-10-CM | POA: Diagnosis not present

## 2015-06-01 DIAGNOSIS — E1129 Type 2 diabetes mellitus with other diabetic kidney complication: Secondary | ICD-10-CM | POA: Diagnosis not present

## 2015-06-03 DIAGNOSIS — I120 Hypertensive chronic kidney disease with stage 5 chronic kidney disease or end stage renal disease: Secondary | ICD-10-CM | POA: Diagnosis not present

## 2015-06-03 DIAGNOSIS — D649 Anemia, unspecified: Secondary | ICD-10-CM | POA: Diagnosis not present

## 2015-06-03 DIAGNOSIS — L97522 Non-pressure chronic ulcer of other part of left foot with fat layer exposed: Secondary | ICD-10-CM | POA: Diagnosis not present

## 2015-06-03 DIAGNOSIS — E1122 Type 2 diabetes mellitus with diabetic chronic kidney disease: Secondary | ICD-10-CM | POA: Diagnosis not present

## 2015-06-03 DIAGNOSIS — N186 End stage renal disease: Secondary | ICD-10-CM | POA: Diagnosis not present

## 2015-06-03 DIAGNOSIS — E11621 Type 2 diabetes mellitus with foot ulcer: Secondary | ICD-10-CM | POA: Diagnosis not present

## 2015-06-04 DIAGNOSIS — D631 Anemia in chronic kidney disease: Secondary | ICD-10-CM | POA: Diagnosis not present

## 2015-06-04 DIAGNOSIS — E1129 Type 2 diabetes mellitus with other diabetic kidney complication: Secondary | ICD-10-CM | POA: Diagnosis not present

## 2015-06-04 DIAGNOSIS — D509 Iron deficiency anemia, unspecified: Secondary | ICD-10-CM | POA: Diagnosis not present

## 2015-06-04 DIAGNOSIS — N2581 Secondary hyperparathyroidism of renal origin: Secondary | ICD-10-CM | POA: Diagnosis not present

## 2015-06-04 DIAGNOSIS — N186 End stage renal disease: Secondary | ICD-10-CM | POA: Diagnosis not present

## 2015-06-05 DIAGNOSIS — L97522 Non-pressure chronic ulcer of other part of left foot with fat layer exposed: Secondary | ICD-10-CM | POA: Diagnosis not present

## 2015-06-05 DIAGNOSIS — D649 Anemia, unspecified: Secondary | ICD-10-CM | POA: Diagnosis not present

## 2015-06-05 DIAGNOSIS — I120 Hypertensive chronic kidney disease with stage 5 chronic kidney disease or end stage renal disease: Secondary | ICD-10-CM | POA: Diagnosis not present

## 2015-06-05 DIAGNOSIS — E11621 Type 2 diabetes mellitus with foot ulcer: Secondary | ICD-10-CM | POA: Diagnosis not present

## 2015-06-05 DIAGNOSIS — E1122 Type 2 diabetes mellitus with diabetic chronic kidney disease: Secondary | ICD-10-CM | POA: Diagnosis not present

## 2015-06-05 DIAGNOSIS — N186 End stage renal disease: Secondary | ICD-10-CM | POA: Diagnosis not present

## 2015-06-06 DIAGNOSIS — E1129 Type 2 diabetes mellitus with other diabetic kidney complication: Secondary | ICD-10-CM | POA: Diagnosis not present

## 2015-06-06 DIAGNOSIS — D631 Anemia in chronic kidney disease: Secondary | ICD-10-CM | POA: Diagnosis not present

## 2015-06-06 DIAGNOSIS — N186 End stage renal disease: Secondary | ICD-10-CM | POA: Diagnosis not present

## 2015-06-06 DIAGNOSIS — N2581 Secondary hyperparathyroidism of renal origin: Secondary | ICD-10-CM | POA: Diagnosis not present

## 2015-06-06 DIAGNOSIS — D509 Iron deficiency anemia, unspecified: Secondary | ICD-10-CM | POA: Diagnosis not present

## 2015-06-07 DIAGNOSIS — E1122 Type 2 diabetes mellitus with diabetic chronic kidney disease: Secondary | ICD-10-CM | POA: Diagnosis not present

## 2015-06-07 DIAGNOSIS — E11621 Type 2 diabetes mellitus with foot ulcer: Secondary | ICD-10-CM | POA: Diagnosis not present

## 2015-06-07 DIAGNOSIS — I120 Hypertensive chronic kidney disease with stage 5 chronic kidney disease or end stage renal disease: Secondary | ICD-10-CM | POA: Diagnosis not present

## 2015-06-07 DIAGNOSIS — D649 Anemia, unspecified: Secondary | ICD-10-CM | POA: Diagnosis not present

## 2015-06-07 DIAGNOSIS — L97522 Non-pressure chronic ulcer of other part of left foot with fat layer exposed: Secondary | ICD-10-CM | POA: Diagnosis not present

## 2015-06-07 DIAGNOSIS — N186 End stage renal disease: Secondary | ICD-10-CM | POA: Diagnosis not present

## 2015-06-08 DIAGNOSIS — Z794 Long term (current) use of insulin: Secondary | ICD-10-CM | POA: Diagnosis not present

## 2015-06-08 DIAGNOSIS — D631 Anemia in chronic kidney disease: Secondary | ICD-10-CM | POA: Diagnosis not present

## 2015-06-08 DIAGNOSIS — E1122 Type 2 diabetes mellitus with diabetic chronic kidney disease: Secondary | ICD-10-CM | POA: Diagnosis not present

## 2015-06-08 DIAGNOSIS — Z992 Dependence on renal dialysis: Secondary | ICD-10-CM | POA: Diagnosis not present

## 2015-06-08 DIAGNOSIS — Z993 Dependence on wheelchair: Secondary | ICD-10-CM | POA: Diagnosis not present

## 2015-06-08 DIAGNOSIS — N186 End stage renal disease: Secondary | ICD-10-CM | POA: Diagnosis not present

## 2015-06-08 DIAGNOSIS — E1129 Type 2 diabetes mellitus with other diabetic kidney complication: Secondary | ICD-10-CM | POA: Diagnosis not present

## 2015-06-08 DIAGNOSIS — D509 Iron deficiency anemia, unspecified: Secondary | ICD-10-CM | POA: Diagnosis not present

## 2015-06-08 DIAGNOSIS — S81002D Unspecified open wound, left knee, subsequent encounter: Secondary | ICD-10-CM | POA: Diagnosis not present

## 2015-06-08 DIAGNOSIS — I120 Hypertensive chronic kidney disease with stage 5 chronic kidney disease or end stage renal disease: Secondary | ICD-10-CM | POA: Diagnosis not present

## 2015-06-08 DIAGNOSIS — N2581 Secondary hyperparathyroidism of renal origin: Secondary | ICD-10-CM | POA: Diagnosis not present

## 2015-06-08 DIAGNOSIS — D649 Anemia, unspecified: Secondary | ICD-10-CM | POA: Diagnosis not present

## 2015-06-08 DIAGNOSIS — Z9181 History of falling: Secondary | ICD-10-CM | POA: Diagnosis not present

## 2015-06-08 DIAGNOSIS — R2689 Other abnormalities of gait and mobility: Secondary | ICD-10-CM | POA: Diagnosis not present

## 2015-06-08 DIAGNOSIS — Z89512 Acquired absence of left leg below knee: Secondary | ICD-10-CM | POA: Diagnosis not present

## 2015-06-11 DIAGNOSIS — D509 Iron deficiency anemia, unspecified: Secondary | ICD-10-CM | POA: Diagnosis not present

## 2015-06-11 DIAGNOSIS — D631 Anemia in chronic kidney disease: Secondary | ICD-10-CM | POA: Diagnosis not present

## 2015-06-11 DIAGNOSIS — E1129 Type 2 diabetes mellitus with other diabetic kidney complication: Secondary | ICD-10-CM | POA: Diagnosis not present

## 2015-06-11 DIAGNOSIS — N186 End stage renal disease: Secondary | ICD-10-CM | POA: Diagnosis not present

## 2015-06-11 DIAGNOSIS — N2581 Secondary hyperparathyroidism of renal origin: Secondary | ICD-10-CM | POA: Diagnosis not present

## 2015-06-12 DIAGNOSIS — R2689 Other abnormalities of gait and mobility: Secondary | ICD-10-CM | POA: Diagnosis not present

## 2015-06-12 DIAGNOSIS — N186 End stage renal disease: Secondary | ICD-10-CM | POA: Diagnosis not present

## 2015-06-12 DIAGNOSIS — S81002D Unspecified open wound, left knee, subsequent encounter: Secondary | ICD-10-CM | POA: Diagnosis not present

## 2015-06-12 DIAGNOSIS — E1122 Type 2 diabetes mellitus with diabetic chronic kidney disease: Secondary | ICD-10-CM | POA: Diagnosis not present

## 2015-06-12 DIAGNOSIS — D649 Anemia, unspecified: Secondary | ICD-10-CM | POA: Diagnosis not present

## 2015-06-12 DIAGNOSIS — I120 Hypertensive chronic kidney disease with stage 5 chronic kidney disease or end stage renal disease: Secondary | ICD-10-CM | POA: Diagnosis not present

## 2015-06-14 DIAGNOSIS — D509 Iron deficiency anemia, unspecified: Secondary | ICD-10-CM | POA: Diagnosis not present

## 2015-06-14 DIAGNOSIS — E1129 Type 2 diabetes mellitus with other diabetic kidney complication: Secondary | ICD-10-CM | POA: Diagnosis not present

## 2015-06-14 DIAGNOSIS — N186 End stage renal disease: Secondary | ICD-10-CM | POA: Diagnosis not present

## 2015-06-14 DIAGNOSIS — D631 Anemia in chronic kidney disease: Secondary | ICD-10-CM | POA: Diagnosis not present

## 2015-06-14 DIAGNOSIS — N2581 Secondary hyperparathyroidism of renal origin: Secondary | ICD-10-CM | POA: Diagnosis not present

## 2015-06-16 DIAGNOSIS — E1129 Type 2 diabetes mellitus with other diabetic kidney complication: Secondary | ICD-10-CM | POA: Diagnosis not present

## 2015-06-16 DIAGNOSIS — N2581 Secondary hyperparathyroidism of renal origin: Secondary | ICD-10-CM | POA: Diagnosis not present

## 2015-06-16 DIAGNOSIS — D509 Iron deficiency anemia, unspecified: Secondary | ICD-10-CM | POA: Diagnosis not present

## 2015-06-16 DIAGNOSIS — N186 End stage renal disease: Secondary | ICD-10-CM | POA: Diagnosis not present

## 2015-06-16 DIAGNOSIS — D631 Anemia in chronic kidney disease: Secondary | ICD-10-CM | POA: Diagnosis not present

## 2015-06-16 NOTE — Progress Notes (Signed)
Patient ID: Jackie [name], female   DOB: 27-Feb-1935, 80 y.o.   MRN: [id number]    [facility]  Code status: full code  No Known Allergies  HPI 80 y/o female patient is here for short term rehabilitation post hospital admission from 04/24/15-04/28/15 with gangrene of left foot. She underwent left below knee amputation. She has PMH of ESRD on HD, HTN, DM among others. She is seen in her room today. Her pain is under control with current pain regimen at rest. She feels low.  Review of Systems  Constitutional: Negative for fever, chills, diaphoresis.  HENT: Negative for congestion, hearing loss and sore throat.   Eyes: Negative for blurred vision, double vision and discharge.  Respiratory: Negative for cough, shortness of breath and wheezing.   Cardiovascular: Negative for chest pain, palpitations.  Gastrointestinal: Negative for heartburn, nausea, vomiting, abdominal pain. Has been constipated.  Genitourinary: Negative for dysuria, makes some urine Musculoskeletal: Negative for back pain, falls.  Skin: Negative for itching and rash.  Neurological: Negative for dizziness, tingling, focal weakness and headaches.  Psychiatric/Behavioral: positive for depression.    Past Medical History  Diagnosis Date  . Hypertension   . Diabetes mellitus   . Thyroid disease     pt denies this  . Anemia   . Hyperlipidemia   . Arthritis   . Pneumonia   . End-stage renal disease on hemodialysis [facility])     dialysis T/Th/Sa   Past Surgical History  Procedure Laterality Date  . Arteriovenous graft placement Left     non function  . Eye surgery Bilateral     catarct  . Cesarean section    . Av fistula placement Right 01/30/2013    Procedure: ARTERIOVENOUS (AV) FISTULA CREATION;  Surgeon: Fransisco Hertz, MD;  Location: [facility] OR;  Service: Vascular;  Laterality: Right;  . Insertion of dialysis catheter Right 04/24/2013    Procedure: INSERTION OF DIALYSIS CATHETER;  Surgeon:  Larina Earthly, MD;  Location: [facility] OR;  Service: Vascular;  Laterality: Right;  . Fistula superficialization Right 04/24/2013    Procedure: FISTULA SUPERFICIALIZATION;  Surgeon: Larina Earthly, MD;  Location: [facility] OR;  Service: Vascular;  Laterality: Right;  . Fistulogram Right 07/26/2013    Procedure: FISTULOGRAM;  Surgeon: Larina Earthly, MD;  Location: [facility] CATH LAB;  Service: Cardiovascular;  Laterality: Right;  . Peripheral vascular catheterization N/A 12/05/2014    Procedure: Abdominal Aortogram;  Surgeon: Fransisco Hertz, MD;  Location: [facility] INVASIVE CV LAB;  Service: Cardiovascular;  Laterality: N/A;  . Peripheral vascular catheterization N/A 03/19/2015    Procedure: Lower Extremity Angiography;  Surgeon: Nada Libman, MD;  Location: [facility] INVASIVE CV LAB;  Service: Cardiovascular;  Laterality: N/A;  . Peripheral vascular catheterization  03/19/2015    Procedure: Peripheral Vascular Intervention;  Surgeon: Nada Libman, MD;  Location: [facility] INVASIVE CV LAB;  Service: Cardiovascular;;  left sfa and popliteal stent  . Tonsillectomy    . Amputation Left 04/24/2015    Procedure: LEFT BELOW KNEE AMPUTATION;  Surgeon: Nadara Mustard, MD;  Location: [facility] OR;  Service: Orthopedics;  Laterality: Left;   Medication reviewed. See MAR  Social History   Social History  . Marital Status: Widowed    Spouse Name: N/A  . Number of Children: N/A  . Years of Education: N/A   Occupational History  . Not on file.   Social History Main Topics  . Smoking status: Never Smoker   . Smokeless  tobacco: Never Used  . Alcohol Use: No  . Drug Use: No  . Sexual Activity: Not on file   Other Topics Concern  . Not on file   Social History Narrative   Family History  Problem Relation Age of Onset  . Diabetes Mother   . Heart disease Mother   . Hypertension Mother    Physical exam BP 133/61 mmHg  Pulse 81  Temp(Src) 97.2 F (36.2 C)  Resp 18  SpO2 99%  General- elderly female in no acute distress Head- atraumatic,  normocephalic Eyes- PERRLA, EOMI, no pallor, no icterus Neck- no lymphadenopathy Cardiovascular- normal s1,s2, no murmurs Respiratory- bilateral clear to auscultation, no wheeze, no rhonchi, no crackles Abdomen- bowel sounds present, soft, non tender Musculoskeletal- left BKA, able to move all other 3 extremities, no spinal and paraspinal tenderness, unsteady gait, no leg edema Skin- left BKA with surgical incision with staples, incision well approximated, no redness/ swelling/ drainage noted, 2 fluid filled blisters around incision area without drainage, skin intact; AV fistula to RUE Neurological- no focal deficit, alert and oriented to person, place and time Psychiatry- flat affect  Labs CBC Latest Ref Rng 04/27/2015 04/26/2015 04/25/2015  WBC 4.0 - 10.5 K/uL 7.6 7.3 7.4  Hemoglobin 12.0 - 15.0 g/dL 7.2(L) 7.5(L) 7.4(L)  Hematocrit 36.0 - 46.0 % 25.9(L) 25.6(L) 25.7(L)  Platelets 150 - 400 K/uL 228 197 198    CMP Latest Ref Rng 04/27/2015 04/26/2015 04/25/2015  Glucose 65 - 99 mg/dL 161(W105(H) 960(A173(H) 540(J106(H)  BUN 6 - 20 mg/dL 81(X28(H) 91(Y24(H) 78(G24(H)  Creatinine 0.44 - 1.00 mg/dL 9.56(O4.84(H) 1.30(Q4.30(H) 6.57(Q5.35(H)  Sodium 135 - 145 mmol/L 132(L) 133(L) 137  Potassium 3.5 - 5.1 mmol/L 4.2 4.1 3.3(L)  Chloride 101 - 111 mmol/L 97(L) 91(L) 93(L)  CO2 22 - 32 mmol/L 28 33(H) 29  Calcium 8.9 - 10.3 mg/dL 8.1(L) 8.1(L) 8.2(L)  Total Protein 6.5 - 8.1 g/dL - - -  Total Bilirubin 0.3 - 1.2 mg/dL - - -  Alkaline Phos 38 - 126 U/L - - -  AST 15 - 41 U/L - - -  ALT 14 - 54 U/L - - -    Assessment/plan  Unsteady gait Post left BKA. Will have her work with physical therapy and occupational therapy team to help with gait training and muscle strengthening exercises.fall precautions. Skin care. Encourage to be out of bed.   Left foot gangrene S/p left transtibial amputation. Continue ceftazidime 2g daily with dialysis and vancomycin 500 mg daily with dialysis. Continue florastor. Monitor vital signs. Continue roxicet  5-325 mg 1-2 tab q4h prn pain. Will get physiatry consult  Depression Likely situational, aptient willing to speak with psychologist, consult placed  ESRD On HD 3 days a week. Continue calcitriol 2 capsules 3 days a week, with fosrenol  Dm type 2 with renal complication Monitor cbg, continue levemir 10 u daily. Continue dialysis  Anemia of chronic disease With her ESRD and iron deficiency. Continue iron supplement. Monitor cbc  Constipation Add colace 100 mg bid with her on iron supplement  Protein calorie malnutrition Continue procel supplement, monitor po intake and weight.   Goals of care: short term rehabilitation  Labs- cbc  Oneal GroutMAHIMA Jaimey Franchini, MD  [facility] [phone] (Monday-Friday 8 am - 5 pm) [phone] (afterhours)

## 2015-06-18 DIAGNOSIS — D509 Iron deficiency anemia, unspecified: Secondary | ICD-10-CM | POA: Diagnosis not present

## 2015-06-18 DIAGNOSIS — D631 Anemia in chronic kidney disease: Secondary | ICD-10-CM | POA: Diagnosis not present

## 2015-06-18 DIAGNOSIS — N186 End stage renal disease: Secondary | ICD-10-CM | POA: Diagnosis not present

## 2015-06-18 DIAGNOSIS — E1129 Type 2 diabetes mellitus with other diabetic kidney complication: Secondary | ICD-10-CM | POA: Diagnosis not present

## 2015-06-18 DIAGNOSIS — N2581 Secondary hyperparathyroidism of renal origin: Secondary | ICD-10-CM | POA: Diagnosis not present

## 2015-06-19 DIAGNOSIS — Z992 Dependence on renal dialysis: Secondary | ICD-10-CM | POA: Diagnosis not present

## 2015-06-19 DIAGNOSIS — E1129 Type 2 diabetes mellitus with other diabetic kidney complication: Secondary | ICD-10-CM | POA: Diagnosis not present

## 2015-06-19 DIAGNOSIS — N186 End stage renal disease: Secondary | ICD-10-CM | POA: Diagnosis not present

## 2015-06-19 DIAGNOSIS — R2689 Other abnormalities of gait and mobility: Secondary | ICD-10-CM | POA: Diagnosis not present

## 2015-06-19 DIAGNOSIS — S81002D Unspecified open wound, left knee, subsequent encounter: Secondary | ICD-10-CM | POA: Diagnosis not present

## 2015-06-19 DIAGNOSIS — E1122 Type 2 diabetes mellitus with diabetic chronic kidney disease: Secondary | ICD-10-CM | POA: Diagnosis not present

## 2015-06-19 DIAGNOSIS — I120 Hypertensive chronic kidney disease with stage 5 chronic kidney disease or end stage renal disease: Secondary | ICD-10-CM | POA: Diagnosis not present

## 2015-06-19 DIAGNOSIS — D649 Anemia, unspecified: Secondary | ICD-10-CM | POA: Diagnosis not present

## 2015-06-20 DIAGNOSIS — N2581 Secondary hyperparathyroidism of renal origin: Secondary | ICD-10-CM | POA: Diagnosis not present

## 2015-06-20 DIAGNOSIS — N186 End stage renal disease: Secondary | ICD-10-CM | POA: Diagnosis not present

## 2015-06-20 DIAGNOSIS — E1129 Type 2 diabetes mellitus with other diabetic kidney complication: Secondary | ICD-10-CM | POA: Diagnosis not present

## 2015-06-20 DIAGNOSIS — D509 Iron deficiency anemia, unspecified: Secondary | ICD-10-CM | POA: Diagnosis not present

## 2015-06-22 DIAGNOSIS — N2581 Secondary hyperparathyroidism of renal origin: Secondary | ICD-10-CM | POA: Diagnosis not present

## 2015-06-22 DIAGNOSIS — E1129 Type 2 diabetes mellitus with other diabetic kidney complication: Secondary | ICD-10-CM | POA: Diagnosis not present

## 2015-06-22 DIAGNOSIS — N186 End stage renal disease: Secondary | ICD-10-CM | POA: Diagnosis not present

## 2015-06-22 DIAGNOSIS — D509 Iron deficiency anemia, unspecified: Secondary | ICD-10-CM | POA: Diagnosis not present

## 2015-06-25 DIAGNOSIS — N186 End stage renal disease: Secondary | ICD-10-CM | POA: Diagnosis not present

## 2015-06-25 DIAGNOSIS — E1129 Type 2 diabetes mellitus with other diabetic kidney complication: Secondary | ICD-10-CM | POA: Diagnosis not present

## 2015-06-25 DIAGNOSIS — N2581 Secondary hyperparathyroidism of renal origin: Secondary | ICD-10-CM | POA: Diagnosis not present

## 2015-06-25 DIAGNOSIS — D509 Iron deficiency anemia, unspecified: Secondary | ICD-10-CM | POA: Diagnosis not present

## 2015-06-26 DIAGNOSIS — N186 End stage renal disease: Secondary | ICD-10-CM | POA: Diagnosis not present

## 2015-06-26 DIAGNOSIS — S81002D Unspecified open wound, left knee, subsequent encounter: Secondary | ICD-10-CM | POA: Diagnosis not present

## 2015-06-26 DIAGNOSIS — D649 Anemia, unspecified: Secondary | ICD-10-CM | POA: Diagnosis not present

## 2015-06-26 DIAGNOSIS — I120 Hypertensive chronic kidney disease with stage 5 chronic kidney disease or end stage renal disease: Secondary | ICD-10-CM | POA: Diagnosis not present

## 2015-06-26 DIAGNOSIS — E1122 Type 2 diabetes mellitus with diabetic chronic kidney disease: Secondary | ICD-10-CM | POA: Diagnosis not present

## 2015-06-26 DIAGNOSIS — R2689 Other abnormalities of gait and mobility: Secondary | ICD-10-CM | POA: Diagnosis not present

## 2015-06-27 DIAGNOSIS — N2581 Secondary hyperparathyroidism of renal origin: Secondary | ICD-10-CM | POA: Diagnosis not present

## 2015-06-27 DIAGNOSIS — D509 Iron deficiency anemia, unspecified: Secondary | ICD-10-CM | POA: Diagnosis not present

## 2015-06-27 DIAGNOSIS — N186 End stage renal disease: Secondary | ICD-10-CM | POA: Diagnosis not present

## 2015-06-27 DIAGNOSIS — E1129 Type 2 diabetes mellitus with other diabetic kidney complication: Secondary | ICD-10-CM | POA: Diagnosis not present

## 2015-06-28 DIAGNOSIS — N186 End stage renal disease: Secondary | ICD-10-CM | POA: Diagnosis not present

## 2015-06-28 DIAGNOSIS — S81002D Unspecified open wound, left knee, subsequent encounter: Secondary | ICD-10-CM | POA: Diagnosis not present

## 2015-06-28 DIAGNOSIS — E1122 Type 2 diabetes mellitus with diabetic chronic kidney disease: Secondary | ICD-10-CM | POA: Diagnosis not present

## 2015-06-28 DIAGNOSIS — D649 Anemia, unspecified: Secondary | ICD-10-CM | POA: Diagnosis not present

## 2015-06-28 DIAGNOSIS — I120 Hypertensive chronic kidney disease with stage 5 chronic kidney disease or end stage renal disease: Secondary | ICD-10-CM | POA: Diagnosis not present

## 2015-06-28 DIAGNOSIS — R2689 Other abnormalities of gait and mobility: Secondary | ICD-10-CM | POA: Diagnosis not present

## 2015-06-29 DIAGNOSIS — N2581 Secondary hyperparathyroidism of renal origin: Secondary | ICD-10-CM | POA: Diagnosis not present

## 2015-06-29 DIAGNOSIS — N186 End stage renal disease: Secondary | ICD-10-CM | POA: Diagnosis not present

## 2015-06-29 DIAGNOSIS — D509 Iron deficiency anemia, unspecified: Secondary | ICD-10-CM | POA: Diagnosis not present

## 2015-06-29 DIAGNOSIS — E1129 Type 2 diabetes mellitus with other diabetic kidney complication: Secondary | ICD-10-CM | POA: Diagnosis not present

## 2015-07-02 DIAGNOSIS — N186 End stage renal disease: Secondary | ICD-10-CM | POA: Diagnosis not present

## 2015-07-02 DIAGNOSIS — E1129 Type 2 diabetes mellitus with other diabetic kidney complication: Secondary | ICD-10-CM | POA: Diagnosis not present

## 2015-07-02 DIAGNOSIS — N2581 Secondary hyperparathyroidism of renal origin: Secondary | ICD-10-CM | POA: Diagnosis not present

## 2015-07-02 DIAGNOSIS — D509 Iron deficiency anemia, unspecified: Secondary | ICD-10-CM | POA: Diagnosis not present

## 2015-07-03 DIAGNOSIS — I120 Hypertensive chronic kidney disease with stage 5 chronic kidney disease or end stage renal disease: Secondary | ICD-10-CM | POA: Diagnosis not present

## 2015-07-03 DIAGNOSIS — R2689 Other abnormalities of gait and mobility: Secondary | ICD-10-CM | POA: Diagnosis not present

## 2015-07-03 DIAGNOSIS — D649 Anemia, unspecified: Secondary | ICD-10-CM | POA: Diagnosis not present

## 2015-07-03 DIAGNOSIS — E1122 Type 2 diabetes mellitus with diabetic chronic kidney disease: Secondary | ICD-10-CM | POA: Diagnosis not present

## 2015-07-03 DIAGNOSIS — S81002D Unspecified open wound, left knee, subsequent encounter: Secondary | ICD-10-CM | POA: Diagnosis not present

## 2015-07-03 DIAGNOSIS — N186 End stage renal disease: Secondary | ICD-10-CM | POA: Diagnosis not present

## 2015-07-04 DIAGNOSIS — E1129 Type 2 diabetes mellitus with other diabetic kidney complication: Secondary | ICD-10-CM | POA: Diagnosis not present

## 2015-07-04 DIAGNOSIS — N2581 Secondary hyperparathyroidism of renal origin: Secondary | ICD-10-CM | POA: Diagnosis not present

## 2015-07-04 DIAGNOSIS — D509 Iron deficiency anemia, unspecified: Secondary | ICD-10-CM | POA: Diagnosis not present

## 2015-07-04 DIAGNOSIS — N186 End stage renal disease: Secondary | ICD-10-CM | POA: Diagnosis not present

## 2015-07-05 DIAGNOSIS — E1122 Type 2 diabetes mellitus with diabetic chronic kidney disease: Secondary | ICD-10-CM | POA: Diagnosis not present

## 2015-07-05 DIAGNOSIS — S81002D Unspecified open wound, left knee, subsequent encounter: Secondary | ICD-10-CM | POA: Diagnosis not present

## 2015-07-05 DIAGNOSIS — R2689 Other abnormalities of gait and mobility: Secondary | ICD-10-CM | POA: Diagnosis not present

## 2015-07-05 DIAGNOSIS — I120 Hypertensive chronic kidney disease with stage 5 chronic kidney disease or end stage renal disease: Secondary | ICD-10-CM | POA: Diagnosis not present

## 2015-07-05 DIAGNOSIS — D649 Anemia, unspecified: Secondary | ICD-10-CM | POA: Diagnosis not present

## 2015-07-05 DIAGNOSIS — N186 End stage renal disease: Secondary | ICD-10-CM | POA: Diagnosis not present

## 2015-07-08 DIAGNOSIS — R2689 Other abnormalities of gait and mobility: Secondary | ICD-10-CM | POA: Diagnosis not present

## 2015-07-08 DIAGNOSIS — E1122 Type 2 diabetes mellitus with diabetic chronic kidney disease: Secondary | ICD-10-CM | POA: Diagnosis not present

## 2015-07-08 DIAGNOSIS — S81002D Unspecified open wound, left knee, subsequent encounter: Secondary | ICD-10-CM | POA: Diagnosis not present

## 2015-07-08 DIAGNOSIS — D649 Anemia, unspecified: Secondary | ICD-10-CM | POA: Diagnosis not present

## 2015-07-08 DIAGNOSIS — I120 Hypertensive chronic kidney disease with stage 5 chronic kidney disease or end stage renal disease: Secondary | ICD-10-CM | POA: Diagnosis not present

## 2015-07-08 DIAGNOSIS — N186 End stage renal disease: Secondary | ICD-10-CM | POA: Diagnosis not present

## 2015-07-09 DIAGNOSIS — D509 Iron deficiency anemia, unspecified: Secondary | ICD-10-CM | POA: Diagnosis not present

## 2015-07-09 DIAGNOSIS — E1129 Type 2 diabetes mellitus with other diabetic kidney complication: Secondary | ICD-10-CM | POA: Diagnosis not present

## 2015-07-09 DIAGNOSIS — N2581 Secondary hyperparathyroidism of renal origin: Secondary | ICD-10-CM | POA: Diagnosis not present

## 2015-07-09 DIAGNOSIS — N186 End stage renal disease: Secondary | ICD-10-CM | POA: Diagnosis not present

## 2015-07-10 DIAGNOSIS — S81002D Unspecified open wound, left knee, subsequent encounter: Secondary | ICD-10-CM | POA: Diagnosis not present

## 2015-07-10 DIAGNOSIS — D649 Anemia, unspecified: Secondary | ICD-10-CM | POA: Diagnosis not present

## 2015-07-10 DIAGNOSIS — N186 End stage renal disease: Secondary | ICD-10-CM | POA: Diagnosis not present

## 2015-07-10 DIAGNOSIS — E1122 Type 2 diabetes mellitus with diabetic chronic kidney disease: Secondary | ICD-10-CM | POA: Diagnosis not present

## 2015-07-10 DIAGNOSIS — I120 Hypertensive chronic kidney disease with stage 5 chronic kidney disease or end stage renal disease: Secondary | ICD-10-CM | POA: Diagnosis not present

## 2015-07-10 DIAGNOSIS — R2689 Other abnormalities of gait and mobility: Secondary | ICD-10-CM | POA: Diagnosis not present

## 2015-07-11 DIAGNOSIS — N186 End stage renal disease: Secondary | ICD-10-CM | POA: Diagnosis not present

## 2015-07-11 DIAGNOSIS — N2581 Secondary hyperparathyroidism of renal origin: Secondary | ICD-10-CM | POA: Diagnosis not present

## 2015-07-11 DIAGNOSIS — E1129 Type 2 diabetes mellitus with other diabetic kidney complication: Secondary | ICD-10-CM | POA: Diagnosis not present

## 2015-07-11 DIAGNOSIS — D509 Iron deficiency anemia, unspecified: Secondary | ICD-10-CM | POA: Diagnosis not present

## 2015-07-13 DIAGNOSIS — N2581 Secondary hyperparathyroidism of renal origin: Secondary | ICD-10-CM | POA: Diagnosis not present

## 2015-07-13 DIAGNOSIS — N186 End stage renal disease: Secondary | ICD-10-CM | POA: Diagnosis not present

## 2015-07-13 DIAGNOSIS — E1129 Type 2 diabetes mellitus with other diabetic kidney complication: Secondary | ICD-10-CM | POA: Diagnosis not present

## 2015-07-13 DIAGNOSIS — D509 Iron deficiency anemia, unspecified: Secondary | ICD-10-CM | POA: Diagnosis not present

## 2015-07-16 DIAGNOSIS — N2581 Secondary hyperparathyroidism of renal origin: Secondary | ICD-10-CM | POA: Diagnosis not present

## 2015-07-16 DIAGNOSIS — N186 End stage renal disease: Secondary | ICD-10-CM | POA: Diagnosis not present

## 2015-07-16 DIAGNOSIS — E1129 Type 2 diabetes mellitus with other diabetic kidney complication: Secondary | ICD-10-CM | POA: Diagnosis not present

## 2015-07-16 DIAGNOSIS — D509 Iron deficiency anemia, unspecified: Secondary | ICD-10-CM | POA: Diagnosis not present

## 2015-07-18 DIAGNOSIS — N186 End stage renal disease: Secondary | ICD-10-CM | POA: Diagnosis not present

## 2015-07-18 DIAGNOSIS — E1129 Type 2 diabetes mellitus with other diabetic kidney complication: Secondary | ICD-10-CM | POA: Diagnosis not present

## 2015-07-18 DIAGNOSIS — N2581 Secondary hyperparathyroidism of renal origin: Secondary | ICD-10-CM | POA: Diagnosis not present

## 2015-07-18 DIAGNOSIS — D509 Iron deficiency anemia, unspecified: Secondary | ICD-10-CM | POA: Diagnosis not present

## 2015-07-19 DIAGNOSIS — E1122 Type 2 diabetes mellitus with diabetic chronic kidney disease: Secondary | ICD-10-CM | POA: Diagnosis not present

## 2015-07-19 DIAGNOSIS — D649 Anemia, unspecified: Secondary | ICD-10-CM | POA: Diagnosis not present

## 2015-07-19 DIAGNOSIS — N186 End stage renal disease: Secondary | ICD-10-CM | POA: Diagnosis not present

## 2015-07-19 DIAGNOSIS — R2689 Other abnormalities of gait and mobility: Secondary | ICD-10-CM | POA: Diagnosis not present

## 2015-07-19 DIAGNOSIS — I120 Hypertensive chronic kidney disease with stage 5 chronic kidney disease or end stage renal disease: Secondary | ICD-10-CM | POA: Diagnosis not present

## 2015-07-19 DIAGNOSIS — S81002D Unspecified open wound, left knee, subsequent encounter: Secondary | ICD-10-CM | POA: Diagnosis not present

## 2015-07-20 DIAGNOSIS — E1129 Type 2 diabetes mellitus with other diabetic kidney complication: Secondary | ICD-10-CM | POA: Diagnosis not present

## 2015-07-20 DIAGNOSIS — Z992 Dependence on renal dialysis: Secondary | ICD-10-CM | POA: Diagnosis not present

## 2015-07-20 DIAGNOSIS — N2581 Secondary hyperparathyroidism of renal origin: Secondary | ICD-10-CM | POA: Diagnosis not present

## 2015-07-20 DIAGNOSIS — D509 Iron deficiency anemia, unspecified: Secondary | ICD-10-CM | POA: Diagnosis not present

## 2015-07-20 DIAGNOSIS — N186 End stage renal disease: Secondary | ICD-10-CM | POA: Diagnosis not present

## 2015-07-23 DIAGNOSIS — E1129 Type 2 diabetes mellitus with other diabetic kidney complication: Secondary | ICD-10-CM | POA: Diagnosis not present

## 2015-07-23 DIAGNOSIS — D509 Iron deficiency anemia, unspecified: Secondary | ICD-10-CM | POA: Diagnosis not present

## 2015-07-23 DIAGNOSIS — N186 End stage renal disease: Secondary | ICD-10-CM | POA: Diagnosis not present

## 2015-07-23 DIAGNOSIS — N2581 Secondary hyperparathyroidism of renal origin: Secondary | ICD-10-CM | POA: Diagnosis not present

## 2015-07-25 DIAGNOSIS — D509 Iron deficiency anemia, unspecified: Secondary | ICD-10-CM | POA: Diagnosis not present

## 2015-07-25 DIAGNOSIS — E1129 Type 2 diabetes mellitus with other diabetic kidney complication: Secondary | ICD-10-CM | POA: Diagnosis not present

## 2015-07-25 DIAGNOSIS — N2581 Secondary hyperparathyroidism of renal origin: Secondary | ICD-10-CM | POA: Diagnosis not present

## 2015-07-25 DIAGNOSIS — N186 End stage renal disease: Secondary | ICD-10-CM | POA: Diagnosis not present

## 2015-07-26 DIAGNOSIS — D649 Anemia, unspecified: Secondary | ICD-10-CM | POA: Diagnosis not present

## 2015-07-26 DIAGNOSIS — S81002D Unspecified open wound, left knee, subsequent encounter: Secondary | ICD-10-CM | POA: Diagnosis not present

## 2015-07-26 DIAGNOSIS — R2689 Other abnormalities of gait and mobility: Secondary | ICD-10-CM | POA: Diagnosis not present

## 2015-07-26 DIAGNOSIS — I120 Hypertensive chronic kidney disease with stage 5 chronic kidney disease or end stage renal disease: Secondary | ICD-10-CM | POA: Diagnosis not present

## 2015-07-26 DIAGNOSIS — N186 End stage renal disease: Secondary | ICD-10-CM | POA: Diagnosis not present

## 2015-07-26 DIAGNOSIS — E1122 Type 2 diabetes mellitus with diabetic chronic kidney disease: Secondary | ICD-10-CM | POA: Diagnosis not present

## 2015-07-30 DIAGNOSIS — D509 Iron deficiency anemia, unspecified: Secondary | ICD-10-CM | POA: Diagnosis not present

## 2015-07-30 DIAGNOSIS — N2581 Secondary hyperparathyroidism of renal origin: Secondary | ICD-10-CM | POA: Diagnosis not present

## 2015-07-30 DIAGNOSIS — N186 End stage renal disease: Secondary | ICD-10-CM | POA: Diagnosis not present

## 2015-07-30 DIAGNOSIS — E1129 Type 2 diabetes mellitus with other diabetic kidney complication: Secondary | ICD-10-CM | POA: Diagnosis not present

## 2015-08-01 DIAGNOSIS — N2581 Secondary hyperparathyroidism of renal origin: Secondary | ICD-10-CM | POA: Diagnosis not present

## 2015-08-01 DIAGNOSIS — D509 Iron deficiency anemia, unspecified: Secondary | ICD-10-CM | POA: Diagnosis not present

## 2015-08-01 DIAGNOSIS — E1129 Type 2 diabetes mellitus with other diabetic kidney complication: Secondary | ICD-10-CM | POA: Diagnosis not present

## 2015-08-01 DIAGNOSIS — N186 End stage renal disease: Secondary | ICD-10-CM | POA: Diagnosis not present

## 2015-08-03 DIAGNOSIS — N186 End stage renal disease: Secondary | ICD-10-CM | POA: Diagnosis not present

## 2015-08-03 DIAGNOSIS — D509 Iron deficiency anemia, unspecified: Secondary | ICD-10-CM | POA: Diagnosis not present

## 2015-08-03 DIAGNOSIS — N2581 Secondary hyperparathyroidism of renal origin: Secondary | ICD-10-CM | POA: Diagnosis not present

## 2015-08-03 DIAGNOSIS — E1129 Type 2 diabetes mellitus with other diabetic kidney complication: Secondary | ICD-10-CM | POA: Diagnosis not present

## 2015-08-06 DIAGNOSIS — E1129 Type 2 diabetes mellitus with other diabetic kidney complication: Secondary | ICD-10-CM | POA: Diagnosis not present

## 2015-08-06 DIAGNOSIS — D509 Iron deficiency anemia, unspecified: Secondary | ICD-10-CM | POA: Diagnosis not present

## 2015-08-06 DIAGNOSIS — N186 End stage renal disease: Secondary | ICD-10-CM | POA: Diagnosis not present

## 2015-08-06 DIAGNOSIS — N2581 Secondary hyperparathyroidism of renal origin: Secondary | ICD-10-CM | POA: Diagnosis not present

## 2015-08-08 DIAGNOSIS — N186 End stage renal disease: Secondary | ICD-10-CM | POA: Diagnosis not present

## 2015-08-08 DIAGNOSIS — N2581 Secondary hyperparathyroidism of renal origin: Secondary | ICD-10-CM | POA: Diagnosis not present

## 2015-08-08 DIAGNOSIS — D509 Iron deficiency anemia, unspecified: Secondary | ICD-10-CM | POA: Diagnosis not present

## 2015-08-08 DIAGNOSIS — E1129 Type 2 diabetes mellitus with other diabetic kidney complication: Secondary | ICD-10-CM | POA: Diagnosis not present

## 2015-08-09 DIAGNOSIS — I70244 Atherosclerosis of native arteries of left leg with ulceration of heel and midfoot: Secondary | ICD-10-CM | POA: Diagnosis not present

## 2015-08-09 DIAGNOSIS — Z89512 Acquired absence of left leg below knee: Secondary | ICD-10-CM | POA: Diagnosis not present

## 2015-08-09 DIAGNOSIS — E1152 Type 2 diabetes mellitus with diabetic peripheral angiopathy with gangrene: Secondary | ICD-10-CM | POA: Diagnosis not present

## 2015-08-09 DIAGNOSIS — E1142 Type 2 diabetes mellitus with diabetic polyneuropathy: Secondary | ICD-10-CM | POA: Diagnosis not present

## 2015-08-10 DIAGNOSIS — N2581 Secondary hyperparathyroidism of renal origin: Secondary | ICD-10-CM | POA: Diagnosis not present

## 2015-08-10 DIAGNOSIS — N186 End stage renal disease: Secondary | ICD-10-CM | POA: Diagnosis not present

## 2015-08-10 DIAGNOSIS — E1129 Type 2 diabetes mellitus with other diabetic kidney complication: Secondary | ICD-10-CM | POA: Diagnosis not present

## 2015-08-10 DIAGNOSIS — D509 Iron deficiency anemia, unspecified: Secondary | ICD-10-CM | POA: Diagnosis not present

## 2015-08-13 DIAGNOSIS — N186 End stage renal disease: Secondary | ICD-10-CM | POA: Diagnosis not present

## 2015-08-13 DIAGNOSIS — N2581 Secondary hyperparathyroidism of renal origin: Secondary | ICD-10-CM | POA: Diagnosis not present

## 2015-08-13 DIAGNOSIS — D509 Iron deficiency anemia, unspecified: Secondary | ICD-10-CM | POA: Diagnosis not present

## 2015-08-13 DIAGNOSIS — E1129 Type 2 diabetes mellitus with other diabetic kidney complication: Secondary | ICD-10-CM | POA: Diagnosis not present

## 2015-08-15 DIAGNOSIS — D509 Iron deficiency anemia, unspecified: Secondary | ICD-10-CM | POA: Diagnosis not present

## 2015-08-15 DIAGNOSIS — E1129 Type 2 diabetes mellitus with other diabetic kidney complication: Secondary | ICD-10-CM | POA: Diagnosis not present

## 2015-08-15 DIAGNOSIS — N2581 Secondary hyperparathyroidism of renal origin: Secondary | ICD-10-CM | POA: Diagnosis not present

## 2015-08-15 DIAGNOSIS — N186 End stage renal disease: Secondary | ICD-10-CM | POA: Diagnosis not present

## 2015-08-17 DIAGNOSIS — E1129 Type 2 diabetes mellitus with other diabetic kidney complication: Secondary | ICD-10-CM | POA: Diagnosis not present

## 2015-08-17 DIAGNOSIS — D509 Iron deficiency anemia, unspecified: Secondary | ICD-10-CM | POA: Diagnosis not present

## 2015-08-17 DIAGNOSIS — N2581 Secondary hyperparathyroidism of renal origin: Secondary | ICD-10-CM | POA: Diagnosis not present

## 2015-08-17 DIAGNOSIS — N186 End stage renal disease: Secondary | ICD-10-CM | POA: Diagnosis not present

## 2015-08-20 DIAGNOSIS — Z992 Dependence on renal dialysis: Secondary | ICD-10-CM | POA: Diagnosis not present

## 2015-08-20 DIAGNOSIS — N2581 Secondary hyperparathyroidism of renal origin: Secondary | ICD-10-CM | POA: Diagnosis not present

## 2015-08-20 DIAGNOSIS — E1129 Type 2 diabetes mellitus with other diabetic kidney complication: Secondary | ICD-10-CM | POA: Diagnosis not present

## 2015-08-20 DIAGNOSIS — N186 End stage renal disease: Secondary | ICD-10-CM | POA: Diagnosis not present

## 2015-08-20 DIAGNOSIS — D509 Iron deficiency anemia, unspecified: Secondary | ICD-10-CM | POA: Diagnosis not present

## 2015-08-22 DIAGNOSIS — N186 End stage renal disease: Secondary | ICD-10-CM | POA: Diagnosis not present

## 2015-08-22 DIAGNOSIS — N2581 Secondary hyperparathyroidism of renal origin: Secondary | ICD-10-CM | POA: Diagnosis not present

## 2015-08-22 DIAGNOSIS — D509 Iron deficiency anemia, unspecified: Secondary | ICD-10-CM | POA: Diagnosis not present

## 2015-08-22 DIAGNOSIS — D631 Anemia in chronic kidney disease: Secondary | ICD-10-CM | POA: Diagnosis not present

## 2015-08-22 DIAGNOSIS — E1129 Type 2 diabetes mellitus with other diabetic kidney complication: Secondary | ICD-10-CM | POA: Diagnosis not present

## 2015-08-23 DIAGNOSIS — N186 End stage renal disease: Secondary | ICD-10-CM | POA: Diagnosis not present

## 2015-08-23 DIAGNOSIS — T82858D Stenosis of vascular prosthetic devices, implants and grafts, subsequent encounter: Secondary | ICD-10-CM | POA: Diagnosis not present

## 2015-08-23 DIAGNOSIS — I871 Compression of vein: Secondary | ICD-10-CM | POA: Diagnosis not present

## 2015-08-23 DIAGNOSIS — Z992 Dependence on renal dialysis: Secondary | ICD-10-CM | POA: Diagnosis not present

## 2015-08-24 DIAGNOSIS — D509 Iron deficiency anemia, unspecified: Secondary | ICD-10-CM | POA: Diagnosis not present

## 2015-08-24 DIAGNOSIS — N186 End stage renal disease: Secondary | ICD-10-CM | POA: Diagnosis not present

## 2015-08-24 DIAGNOSIS — E1129 Type 2 diabetes mellitus with other diabetic kidney complication: Secondary | ICD-10-CM | POA: Diagnosis not present

## 2015-08-24 DIAGNOSIS — N2581 Secondary hyperparathyroidism of renal origin: Secondary | ICD-10-CM | POA: Diagnosis not present

## 2015-08-24 DIAGNOSIS — D631 Anemia in chronic kidney disease: Secondary | ICD-10-CM | POA: Diagnosis not present

## 2015-08-27 DIAGNOSIS — D631 Anemia in chronic kidney disease: Secondary | ICD-10-CM | POA: Diagnosis not present

## 2015-08-27 DIAGNOSIS — N186 End stage renal disease: Secondary | ICD-10-CM | POA: Diagnosis not present

## 2015-08-27 DIAGNOSIS — E1129 Type 2 diabetes mellitus with other diabetic kidney complication: Secondary | ICD-10-CM | POA: Diagnosis not present

## 2015-08-27 DIAGNOSIS — N2581 Secondary hyperparathyroidism of renal origin: Secondary | ICD-10-CM | POA: Diagnosis not present

## 2015-08-27 DIAGNOSIS — D509 Iron deficiency anemia, unspecified: Secondary | ICD-10-CM | POA: Diagnosis not present

## 2015-08-29 DIAGNOSIS — D509 Iron deficiency anemia, unspecified: Secondary | ICD-10-CM | POA: Diagnosis not present

## 2015-08-29 DIAGNOSIS — E1129 Type 2 diabetes mellitus with other diabetic kidney complication: Secondary | ICD-10-CM | POA: Diagnosis not present

## 2015-08-29 DIAGNOSIS — N186 End stage renal disease: Secondary | ICD-10-CM | POA: Diagnosis not present

## 2015-08-29 DIAGNOSIS — N2581 Secondary hyperparathyroidism of renal origin: Secondary | ICD-10-CM | POA: Diagnosis not present

## 2015-08-29 DIAGNOSIS — D631 Anemia in chronic kidney disease: Secondary | ICD-10-CM | POA: Diagnosis not present

## 2015-08-31 DIAGNOSIS — N186 End stage renal disease: Secondary | ICD-10-CM | POA: Diagnosis not present

## 2015-08-31 DIAGNOSIS — D631 Anemia in chronic kidney disease: Secondary | ICD-10-CM | POA: Diagnosis not present

## 2015-08-31 DIAGNOSIS — D509 Iron deficiency anemia, unspecified: Secondary | ICD-10-CM | POA: Diagnosis not present

## 2015-08-31 DIAGNOSIS — E1129 Type 2 diabetes mellitus with other diabetic kidney complication: Secondary | ICD-10-CM | POA: Diagnosis not present

## 2015-08-31 DIAGNOSIS — N2581 Secondary hyperparathyroidism of renal origin: Secondary | ICD-10-CM | POA: Diagnosis not present

## 2015-09-03 DIAGNOSIS — N186 End stage renal disease: Secondary | ICD-10-CM | POA: Diagnosis not present

## 2015-09-03 DIAGNOSIS — N2581 Secondary hyperparathyroidism of renal origin: Secondary | ICD-10-CM | POA: Diagnosis not present

## 2015-09-03 DIAGNOSIS — E1129 Type 2 diabetes mellitus with other diabetic kidney complication: Secondary | ICD-10-CM | POA: Diagnosis not present

## 2015-09-03 DIAGNOSIS — D509 Iron deficiency anemia, unspecified: Secondary | ICD-10-CM | POA: Diagnosis not present

## 2015-09-03 DIAGNOSIS — D631 Anemia in chronic kidney disease: Secondary | ICD-10-CM | POA: Diagnosis not present

## 2015-09-05 ENCOUNTER — Other Ambulatory Visit: Payer: Self-pay | Admitting: Surgery

## 2015-09-05 DIAGNOSIS — D509 Iron deficiency anemia, unspecified: Secondary | ICD-10-CM | POA: Diagnosis not present

## 2015-09-05 DIAGNOSIS — E1129 Type 2 diabetes mellitus with other diabetic kidney complication: Secondary | ICD-10-CM | POA: Diagnosis not present

## 2015-09-05 DIAGNOSIS — N2581 Secondary hyperparathyroidism of renal origin: Secondary | ICD-10-CM | POA: Diagnosis not present

## 2015-09-05 DIAGNOSIS — D631 Anemia in chronic kidney disease: Secondary | ICD-10-CM | POA: Diagnosis not present

## 2015-09-05 DIAGNOSIS — N186 End stage renal disease: Secondary | ICD-10-CM | POA: Diagnosis not present

## 2015-09-06 DIAGNOSIS — Z992 Dependence on renal dialysis: Secondary | ICD-10-CM | POA: Diagnosis not present

## 2015-09-06 DIAGNOSIS — T82858D Stenosis of vascular prosthetic devices, implants and grafts, subsequent encounter: Secondary | ICD-10-CM | POA: Diagnosis not present

## 2015-09-06 DIAGNOSIS — N186 End stage renal disease: Secondary | ICD-10-CM | POA: Diagnosis not present

## 2015-09-06 DIAGNOSIS — I871 Compression of vein: Secondary | ICD-10-CM | POA: Diagnosis not present

## 2015-09-07 DIAGNOSIS — D631 Anemia in chronic kidney disease: Secondary | ICD-10-CM | POA: Diagnosis not present

## 2015-09-07 DIAGNOSIS — N186 End stage renal disease: Secondary | ICD-10-CM | POA: Diagnosis not present

## 2015-09-07 DIAGNOSIS — D509 Iron deficiency anemia, unspecified: Secondary | ICD-10-CM | POA: Diagnosis not present

## 2015-09-07 DIAGNOSIS — E1129 Type 2 diabetes mellitus with other diabetic kidney complication: Secondary | ICD-10-CM | POA: Diagnosis not present

## 2015-09-07 DIAGNOSIS — N2581 Secondary hyperparathyroidism of renal origin: Secondary | ICD-10-CM | POA: Diagnosis not present

## 2015-09-09 ENCOUNTER — Encounter: Payer: Self-pay | Admitting: Physical Therapy

## 2015-09-09 ENCOUNTER — Ambulatory Visit: Payer: Medicare Other | Attending: Orthopedic Surgery | Admitting: Physical Therapy

## 2015-09-09 DIAGNOSIS — R2681 Unsteadiness on feet: Secondary | ICD-10-CM | POA: Diagnosis not present

## 2015-09-09 DIAGNOSIS — R6889 Other general symptoms and signs: Secondary | ICD-10-CM | POA: Diagnosis not present

## 2015-09-09 DIAGNOSIS — R29818 Other symptoms and signs involving the nervous system: Secondary | ICD-10-CM | POA: Diagnosis not present

## 2015-09-09 DIAGNOSIS — R531 Weakness: Secondary | ICD-10-CM | POA: Insufficient documentation

## 2015-09-09 DIAGNOSIS — Z4789 Encounter for other orthopedic aftercare: Secondary | ICD-10-CM

## 2015-09-09 DIAGNOSIS — Z89512 Acquired absence of left leg below knee: Secondary | ICD-10-CM | POA: Diagnosis not present

## 2015-09-09 DIAGNOSIS — Z5189 Encounter for other specified aftercare: Secondary | ICD-10-CM | POA: Insufficient documentation

## 2015-09-09 DIAGNOSIS — R269 Unspecified abnormalities of gait and mobility: Secondary | ICD-10-CM | POA: Diagnosis not present

## 2015-09-09 DIAGNOSIS — R2689 Other abnormalities of gait and mobility: Secondary | ICD-10-CM

## 2015-09-09 NOTE — Therapy (Signed)
[facility] [address] [place], [place], [address] Phone: [phone]   Fax:  [phone]  Physical Therapy Evaluation  Patient Details  Name: Jackie [name] MRN: [id number] Date of Birth: [birth year] Referring Provider: Aldean Baker, MD  Encounter Date: 09/09/2015      PT End of Session - 09/09/15 1015    Visit Number 1   Number of Visits 18   Date for PT Re-Evaluation 11/08/15   Authorization Type Medicare G-code & progress report every 10th visit   PT Start Time 0930   PT Stop Time 1015   PT Time Calculation (min) 45 min   Equipment Utilized During Treatment Gait belt   Activity Tolerance Patient tolerated treatment well   Behavior During Therapy [facility] for tasks assessed/performed      Past Medical History  Diagnosis Date  . Hypertension   . Diabetes mellitus   . Thyroid disease     pt denies this  . Anemia   . Hyperlipidemia   . Arthritis   . Pneumonia   . End-stage renal disease on hemodialysis [facility])     dialysis T/Th/Sa    Past Surgical History  Procedure Laterality Date  . Arteriovenous graft placement Left     non function  . Eye surgery Bilateral     catarct  . Cesarean section    . Av fistula placement Right 01/30/2013    Procedure: ARTERIOVENOUS (AV) FISTULA CREATION;  Surgeon: Fransisco Hertz, MD;  Location: [facility] OR;  Service: Vascular;  Laterality: Right;  . Insertion of dialysis catheter Right 04/24/2013    Procedure: INSERTION OF DIALYSIS CATHETER;  Surgeon: Larina Earthly, MD;  Location: [facility] OR;  Service: Vascular;  Laterality: Right;  . Fistula superficialization Right 04/24/2013    Procedure: FISTULA SUPERFICIALIZATION;  Surgeon: Larina Earthly, MD;  Location: [facility] OR;  Service: Vascular;  Laterality: Right;  . Fistulogram Right 07/26/2013    Procedure: FISTULOGRAM;  Surgeon: Larina Earthly, MD;  Location: [facility] CATH LAB;  Service: Cardiovascular;  Laterality: Right;  . Peripheral vascular  catheterization N/A 12/05/2014    Procedure: Abdominal Aortogram;  Surgeon: Fransisco Hertz, MD;  Location: [facility] INVASIVE CV LAB;  Service: Cardiovascular;  Laterality: N/A;  . Peripheral vascular catheterization N/A 03/19/2015    Procedure: Lower Extremity Angiography;  Surgeon: Nada Libman, MD;  Location: [facility] INVASIVE CV LAB;  Service: Cardiovascular;  Laterality: N/A;  . Peripheral vascular catheterization  03/19/2015    Procedure: Peripheral Vascular Intervention;  Surgeon: Nada Libman, MD;  Location: [facility] INVASIVE CV LAB;  Service: Cardiovascular;;  left sfa and popliteal stent  . Tonsillectomy    . Amputation Left 04/24/2015    Procedure: LEFT BELOW KNEE AMPUTATION;  Surgeon: Nadara Mustard, MD;  Location: [facility] OR;  Service: Orthopedics;  Laterality: Left;    There were no vitals filed for this visit.  Visit Diagnosis:  Abnormality of gait  Unsteadiness  Balance problems  Weakness generalized  Decreased functional activity tolerance  Encounter for prosthetic gait training  Status post below knee amputation of left lower extremity [facility])      Subjective Assessment - 09/09/15 0939    Subjective This 80yo female underwent a left Transtibial Amputation on 04/24/2015 due to infected wounds. She recieved her first prosthesis on 08/26/2015 and is dependent in use & care. She presents for physical therapy evaluation.    Patient is accompained by: Family member   Pertinent History HTN, DM, thyroid disease, HLD,  arthritis, ESRD, pneumonia, syncope   Patient Stated Goals She wants to prosthesis to walk in community & home.    Currently in Pain? No/denies            [facility] PT Assessment - 09/09/15 0930    Assessment   Medical Diagnosis Left Transtibial Amputation prosthesis   Referring Provider Aldean Baker, MD   Onset Date/Surgical Date 08/26/15  prosthesis delivery   Hand Dominance Right   Precautions   Precautions Fall   Precaution Comments No BP RUE   Restrictions   Weight  Bearing Restrictions No   Balance Screen   Has the patient fallen in the past 6 months No   Has the patient had a decrease in activity level because of a fear of falling?  Yes   Is the patient reluctant to leave their home because of a fear of falling?  Yes   Home Environment   Living Environment Private residence   Living Arrangements Alone   Available Help at Discharge Family  4 sons in [place] & 1 in [place]   Type of Home Apartment   Home Access Level entry  5 steps with left rail as secondary entrance   Home Layout One level   Home Equipment Walker - 2 wheels;Cane - single point;Walker - 4 wheels;Shower seat;Grab bars - tub/shower;Hand held shower head;Wheelchair - manual   Prior Function   Level of Independence Independent;Independent with household mobility without device;Independent with community mobility with device;Independent with gait  used cane in community   Vocation Retired   Observation/Other Assessments   Focus on Therapeutic Outcomes (FOTO)  40.85 Functional Status   Fear Avoidance Belief Questionnaire (FABQ)  61 (16)   Posture/Postural Control   Posture/Postural Control Postural limitations   Postural Limitations Rounded Shoulders;Forward head;Flexed trunk;Weight shift right   ROM / Strength   AROM / PROM / Strength AROM;Strength   AROM   Overall AROM  Within functional limits for tasks performed   Strength   Overall Strength Deficits   Strength Assessment Site Hip;Knee   Right/Left Hip Right;Left   Right Hip Flexion 4/5   Right Hip Extension 3+/5   Right Hip ABduction 3+/5   Left Hip Flexion 4/5   Left Hip Extension 3+/5   Left Hip ABduction 3+/5   Transfers   Transfers Sit to Stand;Stand to Sit   Sit to Stand 5: Supervision;With upper extremity assist;With armrests;From chair/3-in-1  uses RW to stabilize   Stand to Sit 5: Supervision;With upper extremity assist;With armrests;To chair/3-in-1  uses RW to stabilize   Ambulation/Gait   Ambulation/Gait Yes    Ambulation/Gait Assistance 5: Supervision   Ambulation Distance (Feet) 50 Feet   Assistive device Prosthesis;Rolling walker   Gait Pattern Step-to pattern;Decreased step length - right;Decreased stance time - left;Decreased stride length;Decreased hip/knee flexion - left;Decreased weight shift to left;Left steppage;Antalgic;Trunk flexed;Abducted - left;Poor foot clearance - left   Ambulation Surface Indoor;Level   Gait velocity 0.65 ft/sec   Standardized Balance Assessment   Standardized Balance Assessment Berg Balance Test   Berg Balance Test   Sit to Stand Able to stand  independently using hands   Standing Unsupported Able to stand 2 minutes with supervision   Sitting with Back Unsupported but Feet Supported on Floor or Stool Able to sit safely and securely 2 minutes   Stand to Sit Controls descent by using hands   Transfers Able to transfer safely, definite need of hands   Standing Unsupported with Eyes Closed Able to stand  10 seconds with supervision   Standing Ubsupported with Feet Together Needs help to attain position but able to stand for 30 seconds with feet together   From Standing, Reach Forward with Outstretched Arm Reaches forward but needs supervision   From Standing Position, Pick up Object from Floor Unable to pick up and needs supervision   From Standing Position, Turn to Look Behind Over each Shoulder Needs supervision when turning   Turn 360 Degrees Needs assistance while turning   Standing Unsupported, Alternately Place Feet on Step/Stool Needs assistance to keep from falling or unable to try   Standing Unsupported, One Foot in Front Loses balance while stepping or standing   Standing on One Leg Unable to try or needs assist to prevent fall   Total Score 23         Prosthetics Assessment - 09/09/15 0930    Prosthetics   Prosthetic Care Dependent with Skin check;Residual limb care;Prosthetic cleaning;Ply sock cleaning;Correct ply sock adjustment;Proper wear  schedule/adjustment;Proper weight-bearing schedule/adjustment;Other (comment)  weighing prosthesis & incorporating prosthesis w/ dry weight   Donning prosthesis  Min assist   Doffing prosthesis  Supervision   Current prosthetic wear tolerance (days/week)  daily since delivery   Current prosthetic wear tolerance (#hours/day)  reports wear 3-4 hrs 2x/day   Current prosthetic weight-bearing tolerance (hours/day)  Pt tolerates standing 5 minutes with partial weight on prosthesis with no c/o pain or discomfort.    Edema non-pitting   Residual limb condition  no open areas, dry scaly skin, normal color, temperature.                   [facility] Adult PT Treatment/Exercise - 09/09/15 0930    Prosthetics   Education Provided Skin check;Residual limb care;Care of non-amputated limb;Prosthetic cleaning;Ply sock cleaning;Correct ply sock adjustment;Proper Donning;Proper Doffing;Proper wear schedule/adjustment;Proper weight-bearing schedule/adjustment;Other (comment)  weighing prosthesis & incorporating prosthesis w/ dry weight   Person(s) Educated Patient;Child(ren)   Education Method Explanation;Demonstration;Tactile cues;Verbal cues   Education Method Verbalized understanding;Returned demonstration;Tactile cues required;Verbal cues required;Needs further instruction                  PT Short Term Goals - 09/09/15 1015    PT SHORT TERM GOAL #1   Title Patient demonstrates proper donning/doffing, verbalizes proper cleaning & weighing /wearing at dialysis. (Target Date: 10/09/2015)   Time 1   Period Months   Status New   PT SHORT TERM GOAL #2   Title Patient tolerates prosthesis wear >10 hrs total /day without skin issues or tenderness. (Target Date: 10/09/2015)   Time 1   Period Months   Status New   PT SHORT TERM GOAL #3   Title Patient reaches 7" anteriorly, laterally, across midline and to floor with supervision. (Target Date: 10/09/2015)   Time 1   Period Months   Status New    PT SHORT TERM GOAL #4   Title Patient ambulates 200' with prosthesis & RW with supervision. (Target Date: 10/09/2015)   Time 1   Period Months   Status New   PT SHORT TERM GOAL #5   Title Patient negotiates ramps, curbs & stairs (2 rails) with RW & prosthesis with supervision. (Target Date: 10/09/2015)   Time 1   Period Months   Status New           PT Long Term Goals - 09/09/15 1015    PT LONG TERM GOAL #1   Title Patient is independent with prosthetic care including using prosthesis at  dialysis to enable safe utilization of prosthesis. (Target Date: 11/08/2015)   Time 2   Period Months   Status New   PT LONG TERM GOAL #2   Title Patient tolerates wear of prosthesis >90% of awake hours to enable function majority of her day. (Target Date: 11/08/2015)   Time 2   Period Months   Status New   PT LONG TERM GOAL #3   Title Berg Balance >/= 45/56 to indicate lower fall risk. (Target Date: 11/08/2015)   Time 2   Period Months   Status New   PT LONG TERM GOAL #4   Title Pateint ambulates 300' with LRAD & prosthesis including ramps & curbs modified independent to enable community mobility. (Target Date: 11/08/2015)   Time 2   Period Months   Status New   PT LONG TERM GOAL #5   Title Patient ambulates household distances with LRAD & prosthesis modified independent. (Target Date: 11/08/2015)   Time 2   Period Months   Status New               Plan - 27-Sep-2015 1015    Clinical Impression Statement This 81yo female recieved her first prosthesis 2 weeks ago and is dependent in use & care with risk of skin integrity & tenderness issues. Patient is limited in wear which limits her mobility as she is w/c bound when not wearing prosthesis. Berg Balance 23/56 indicates high fall risk. Gait velocity of 0.65 ft/esc and dependency indicate limitations in household & community moblity Patient's condition is evolving and clinical decision making is moderate complexity.    Pt will benefit  from skilled therapeutic intervention in order to improve on the following deficits Abnormal gait;Decreased activity tolerance;Decreased balance;Decreased endurance;Decreased knowledge of precautions;Decreased knowledge of use of DME;Decreased mobility;Decreased strength;Postural dysfunction;Prosthetic Dependency   Rehab Potential Good   PT Frequency 2x / week   PT Duration Other (comment)  9 weeks (60 days)   PT Treatment/Interventions ADLs/Self Care Home Management;DME Instruction;Gait training;Stair training;Functional mobility training;Therapeutic activities;Therapeutic exercise;Balance training;Neuromuscular re-education;Patient/family education;Prosthetic Training   PT Next Visit Plan instruct in HEP for mid-line, review prosthetic care, gait with RW including barriers   Consulted and Agree with Plan of Care Patient;Family member/caregiver   Family Member Consulted son          G-Codes - 2015/09/27 1015    Functional Assessment Tool Used Patient is dependent in prosthetic care. She tolerates wear ~8 hrs/day which is ~50% of awake hours.    Functional Limitation Self care   Self Care Current Status [phone]) At least 60 percent but less than 80 percent impaired, limited or restricted   Self Care Goal Status (U0454) At least 1 percent but less than 20 percent impaired, limited or restricted       Problem List Patient Active Problem List   Diagnosis Date Noted  . S/P unilateral BKA (below knee amputation) (HCC) 04/24/2015  . 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 Leukocytosis, unspecified 04/22/2013  . HTN (hypertension), benign 04/18/2013  . Diabetes mellitus (HCC) 04/18/2013  . Syncope 04/18/2013  . Anemia 04/18/2013  . End stage renal disease (HCC) 09/02/2011    Miakoda Mcmillion PT, DPT 2015/09/27, 10:46 PM  [facility] [address] [place], [place], [address] Phone: [phone]   Fax:   [phone]  Name: Jackie [name] MRN: [id number] Date of Birth: March 29, 1935

## 2015-09-10 DIAGNOSIS — E1129 Type 2 diabetes mellitus with other diabetic kidney complication: Secondary | ICD-10-CM | POA: Diagnosis not present

## 2015-09-10 DIAGNOSIS — N2581 Secondary hyperparathyroidism of renal origin: Secondary | ICD-10-CM | POA: Diagnosis not present

## 2015-09-10 DIAGNOSIS — N186 End stage renal disease: Secondary | ICD-10-CM | POA: Diagnosis not present

## 2015-09-10 DIAGNOSIS — D631 Anemia in chronic kidney disease: Secondary | ICD-10-CM | POA: Diagnosis not present

## 2015-09-10 DIAGNOSIS — D509 Iron deficiency anemia, unspecified: Secondary | ICD-10-CM | POA: Diagnosis not present

## 2015-09-11 ENCOUNTER — Encounter: Payer: Medicare Other | Admitting: Physical Therapy

## 2015-09-12 DIAGNOSIS — D631 Anemia in chronic kidney disease: Secondary | ICD-10-CM | POA: Diagnosis not present

## 2015-09-12 DIAGNOSIS — N2581 Secondary hyperparathyroidism of renal origin: Secondary | ICD-10-CM | POA: Diagnosis not present

## 2015-09-12 DIAGNOSIS — E1129 Type 2 diabetes mellitus with other diabetic kidney complication: Secondary | ICD-10-CM | POA: Diagnosis not present

## 2015-09-12 DIAGNOSIS — D509 Iron deficiency anemia, unspecified: Secondary | ICD-10-CM | POA: Diagnosis not present

## 2015-09-12 DIAGNOSIS — N186 End stage renal disease: Secondary | ICD-10-CM | POA: Diagnosis not present

## 2015-09-14 DIAGNOSIS — D631 Anemia in chronic kidney disease: Secondary | ICD-10-CM | POA: Diagnosis not present

## 2015-09-14 DIAGNOSIS — N2581 Secondary hyperparathyroidism of renal origin: Secondary | ICD-10-CM | POA: Diagnosis not present

## 2015-09-14 DIAGNOSIS — E1129 Type 2 diabetes mellitus with other diabetic kidney complication: Secondary | ICD-10-CM | POA: Diagnosis not present

## 2015-09-14 DIAGNOSIS — N186 End stage renal disease: Secondary | ICD-10-CM | POA: Diagnosis not present

## 2015-09-14 DIAGNOSIS — D509 Iron deficiency anemia, unspecified: Secondary | ICD-10-CM | POA: Diagnosis not present

## 2015-09-17 DIAGNOSIS — N186 End stage renal disease: Secondary | ICD-10-CM | POA: Diagnosis not present

## 2015-09-17 DIAGNOSIS — D509 Iron deficiency anemia, unspecified: Secondary | ICD-10-CM | POA: Diagnosis not present

## 2015-09-17 DIAGNOSIS — Z992 Dependence on renal dialysis: Secondary | ICD-10-CM | POA: Diagnosis not present

## 2015-09-17 DIAGNOSIS — E1129 Type 2 diabetes mellitus with other diabetic kidney complication: Secondary | ICD-10-CM | POA: Diagnosis not present

## 2015-09-17 DIAGNOSIS — D631 Anemia in chronic kidney disease: Secondary | ICD-10-CM | POA: Diagnosis not present

## 2015-09-17 DIAGNOSIS — N2581 Secondary hyperparathyroidism of renal origin: Secondary | ICD-10-CM | POA: Diagnosis not present

## 2015-09-18 ENCOUNTER — Ambulatory Visit: Payer: Medicare Other | Attending: Orthopedic Surgery | Admitting: Physical Therapy

## 2015-09-18 ENCOUNTER — Encounter: Payer: Self-pay | Admitting: Physical Therapy

## 2015-09-18 DIAGNOSIS — R269 Unspecified abnormalities of gait and mobility: Secondary | ICD-10-CM | POA: Diagnosis not present

## 2015-09-18 DIAGNOSIS — R2681 Unsteadiness on feet: Secondary | ICD-10-CM

## 2015-09-18 DIAGNOSIS — Z5189 Encounter for other specified aftercare: Secondary | ICD-10-CM | POA: Diagnosis not present

## 2015-09-18 DIAGNOSIS — R6889 Other general symptoms and signs: Secondary | ICD-10-CM

## 2015-09-18 DIAGNOSIS — R531 Weakness: Secondary | ICD-10-CM | POA: Diagnosis not present

## 2015-09-18 DIAGNOSIS — Z89512 Acquired absence of left leg below knee: Secondary | ICD-10-CM | POA: Diagnosis not present

## 2015-09-18 DIAGNOSIS — R29818 Other symptoms and signs involving the nervous system: Secondary | ICD-10-CM | POA: Insufficient documentation

## 2015-09-18 NOTE — Patient Instructions (Signed)

## 2015-09-18 NOTE — Therapy (Signed)
[facility] [address] [place], [place], [address] Phone: [phone]   Fax:  [phone]  Physical Therapy Treatment  Patient Details  Name: Jackie [name] MRN: [id number] Date of Birth: Jan 12, 1935 Referring Provider: Aldean Baker, MD  Encounter Date: 09/18/2015      PT End of Session - 09/18/15 1125    Visit Number 2   Number of Visits 18   Date for PT Re-Evaluation 11/08/15   Authorization Type Medicare G-code & progress report every 10th visit   PT Start Time 0945  Patient late due to transportation arriving late.   PT Stop Time 1015   PT Time Calculation (min) 30 min   Equipment Utilized During Treatment Gait belt   Activity Tolerance Patient tolerated treatment well   Behavior During Therapy WFL for tasks assessed/performed      Past Medical History  Diagnosis Date  . Hypertension   . Diabetes mellitus   . Thyroid disease     pt denies this  . Anemia   . Hyperlipidemia   . Arthritis   . Pneumonia   . End-stage renal disease on hemodialysis [facility])     dialysis T/Th/Sa    Past Surgical History  Procedure Laterality Date  . Arteriovenous graft placement Left     non function  . Eye surgery Bilateral     catarct  . Cesarean section    . Av fistula placement Right 01/30/2013    Procedure: ARTERIOVENOUS (AV) FISTULA CREATION;  Surgeon: Fransisco Hertz, MD;  Location: [facility] OR;  Service: Vascular;  Laterality: Right;  . Insertion of dialysis catheter Right 04/24/2013    Procedure: INSERTION OF DIALYSIS CATHETER;  Surgeon: Larina Earthly, MD;  Location: [facility] OR;  Service: Vascular;  Laterality: Right;  . Fistula superficialization Right 04/24/2013    Procedure: FISTULA SUPERFICIALIZATION;  Surgeon: Larina Earthly, MD;  Location: [facility] OR;  Service: Vascular;  Laterality: Right;  . Fistulogram Right 07/26/2013    Procedure: FISTULOGRAM;  Surgeon: Larina Earthly, MD;  Location: [facility] CATH LAB;  Service: Cardiovascular;   Laterality: Right;  . Peripheral vascular catheterization N/A 12/05/2014    Procedure: Abdominal Aortogram;  Surgeon: Fransisco Hertz, MD;  Location: [facility] INVASIVE CV LAB;  Service: Cardiovascular;  Laterality: N/A;  . Peripheral vascular catheterization N/A 03/19/2015    Procedure: Lower Extremity Angiography;  Surgeon: Nada Libman, MD;  Location: [facility] INVASIVE CV LAB;  Service: Cardiovascular;  Laterality: N/A;  . Peripheral vascular catheterization  03/19/2015    Procedure: Peripheral Vascular Intervention;  Surgeon: Nada Libman, MD;  Location: [facility] INVASIVE CV LAB;  Service: Cardiovascular;;  left sfa and popliteal stent  . Tonsillectomy    . Amputation Left 04/24/2015    Procedure: LEFT BELOW KNEE AMPUTATION;  Surgeon: Nadara Mustard, MD;  Location: [facility] OR;  Service: Orthopedics;  Laterality: Left;    There were no vitals filed for this visit.  Visit Diagnosis:  Unsteadiness  Decreased functional activity tolerance  Status post below knee amputation of left lower extremity (HCC)      Subjective Assessment - 09/18/15 0948    Subjective Patient reports no new pain, no new falls. Patient states the prosthetic still feels good.   Patient is accompained by: Family member   Pertinent History HTN, DM, thyroid disease, HLD, arthritis, ESRD, pneumonia, syncope   Patient Stated Goals She wants to prosthesis to walk in community & home.        09/18/15 1116  Transfers  Transfers Sit to Stand;Stand to Sit  Sit to Stand 5: Supervision;With upper extremity assist;With armrests;From chair/3-in-1  Stand to Sit 5: Supervision;With upper extremity assist;With armrests;To chair/3-in-1  Ambulation/Gait  Ambulation/Gait Yes  Ambulation/Gait Assistance 5: Supervision  Ambulation/Gait Assistance Details pt with narrow base of support, decreased weight shift and stance on prosthsis. cues on walker position.   Ambulation Distance (Feet) 60 Feet  Assistive device Prosthesis;Rolling walker  Gait Pattern  Step-to pattern;Decreased step length - right;Decreased stance time - left;Decreased stride length;Decreased hip/knee flexion - left;Decreased weight shift to left;Left steppage;Antalgic;Trunk flexed;Abducted - left;Poor foot clearance - left  Ambulation Surface Level;Indoor  Prosthetics  Current prosthetic wear tolerance (days/week)  daily since delivery  Current prosthetic wear tolerance (#hours/day)  reports wear 3-4 hrs 2x/day  Edema non-pitting  Residual limb condition  no open areas, dry scaly skin, normal color, temperature.   Education Provided Skin check;Residual limb care;Correct ply sock adjustment;Proper Donning;Other (comment) (Patient was wearing shrinker underneath liner.)  Person(s) Educated Patient  Education Method Explanation;Demonstration;Verbal cues  Education Method Verbalized understanding;Returned demonstration;Verbal cues required;Needs further instruction  Donning Prosthesis 5  Doffing Prosthesis 5    Patient performed 1 set of each midline exercise at sink with 10 reps and 5 second holds min/guard to increase balance, weight shifting, and weight bearing on LLE.       PT Education - 09/18/15 1117    Education provided Yes   Education Details Patient educated with regard to proper application of prosthetic. Patient still wore shrinker with prosthesis today and needed an additional ply layer to maintain proper seating of leg within prosthesis. HEP sink created and issued.   Person(s) Educated Patient   Methods Explanation;Handout;Verbal cues;Tactile cues   Comprehension Verbalized understanding;Verbal cues required;Need further instruction;Returned demonstration            PT Short Term Goals - 09/09/15 1015    PT SHORT TERM GOAL #1   Title Patient demonstrates proper donning/doffing, verbalizes proper cleaning & weighing /wearing at dialysis. (Target Date: 10/09/2015)   Time 1   Period Months   Status New   PT SHORT TERM GOAL #2   Title Patient  tolerates prosthesis wear >10 hrs total /day without skin issues or tenderness. (Target Date: 10/09/2015)   Time 1   Period Months   Status New   PT SHORT TERM GOAL #3   Title Patient reaches 7" anteriorly, laterally, across midline and to floor with supervision. (Target Date: 10/09/2015)   Time 1   Period Months   Status New   PT SHORT TERM GOAL #4   Title Patient ambulates 200' with prosthesis & RW with supervision. (Target Date: 10/09/2015)   Time 1   Period Months   Status New   PT SHORT TERM GOAL #5   Title Patient negotiates ramps, curbs & stairs (2 rails) with RW & prosthesis with supervision. (Target Date: 10/09/2015)   Time 1   Period Months   Status New           PT Long Term Goals - 09/09/15 1015    PT LONG TERM GOAL #1   Title Patient is independent with prosthetic care including using prosthesis at dialysis to enable safe utilization of prosthesis. (Target Date: 11/08/2015)   Time 2   Period Months   Status New   PT LONG TERM GOAL #2   Title Patient tolerates wear of prosthesis >90% of awake hours to enable function majority of her day. (Target Date: 11/08/2015)  Time 2   Period Months   Status New   PT LONG TERM GOAL #3   Title Berg Balance >/= 45/56 to indicate lower fall risk. (Target Date: 11/08/2015)   Time 2   Period Months   Status New   PT LONG TERM GOAL #4   Title Pateint ambulates 300' with LRAD & prosthesis including ramps & curbs modified independent to enable community mobility. (Target Date: 11/08/2015)   Time 2   Period Months   Status New   PT LONG TERM GOAL #5   Title Patient ambulates household distances with LRAD & prosthesis modified independent. (Target Date: 11/08/2015)   Time 2   Period Months   Status New          Plan - 09/18/15 1127    Clinical Impression Statement Skilled session addressing prosthetic care and issuing home exercise program with patient. Patient appeared receptive to learn but struggled with home exercise program  on first run-through, especially with foot placement. Patient has demonstrated she wishes to make progress toward goals and has begun making progress toward goals.   Pt will benefit from skilled therapeutic intervention in order to improve on the following deficits Abnormal gait;Decreased activity tolerance;Decreased balance;Decreased endurance;Decreased knowledge of precautions;Decreased knowledge of use of DME;Decreased mobility;Decreased strength;Postural dysfunction;Prosthetic Dependency   Rehab Potential Good   PT Frequency 2x / week   PT Duration Other (comment)  9 weeks (60 days)   PT Treatment/Interventions ADLs/Self Care Home Management;DME Instruction;Gait training;Stair training;Functional mobility training;Therapeutic activities;Therapeutic exercise;Balance training;Neuromuscular re-education;Patient/family education;Prosthetic Training   PT Next Visit Plan Review HEP for mid-line, as patient struggled in first run-through, review prosthetic care, begin gait training with RW including barriers.   PT Home Exercise Plan HEP created for sink exercises. Handout issued to patient.   Consulted and Agree with Plan of Care Patient        Problem List Patient Active Problem List   Diagnosis Date Noted  . S/P unilateral BKA (below knee amputation) (HCC) 04/24/2015  . 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 Leukocytosis, unspecified 04/22/2013  . HTN (hypertension), benign 04/18/2013  . Diabetes mellitus (HCC) 04/18/2013  . Syncope 04/18/2013  . Anemia 04/18/2013  . End stage renal disease (HCC) 09/02/2011    Otis Dials, SPTA 09/19/2015, 10:00 AM  [facility] [address] [place], [place], [address] Phone: [phone]   Fax:  [phone]  Name: Jackie [name] MRN: [id number] Date of Birth: February 22, 1935  This note has been reviewed and edited by supervising CI.  Sallyanne Kuster, PTA, [facility] Outpatient Neuro  [facility] [address], [address] [place], [place] [address] [phone] 09/19/2015, 11:53 AM

## 2015-09-19 DIAGNOSIS — N2581 Secondary hyperparathyroidism of renal origin: Secondary | ICD-10-CM | POA: Diagnosis not present

## 2015-09-19 DIAGNOSIS — E1129 Type 2 diabetes mellitus with other diabetic kidney complication: Secondary | ICD-10-CM | POA: Diagnosis not present

## 2015-09-19 DIAGNOSIS — N186 End stage renal disease: Secondary | ICD-10-CM | POA: Diagnosis not present

## 2015-09-19 DIAGNOSIS — D631 Anemia in chronic kidney disease: Secondary | ICD-10-CM | POA: Diagnosis not present

## 2015-09-19 DIAGNOSIS — D509 Iron deficiency anemia, unspecified: Secondary | ICD-10-CM | POA: Diagnosis not present

## 2015-09-20 ENCOUNTER — Ambulatory Visit: Payer: Medicare Other | Admitting: Physical Therapy

## 2015-09-20 ENCOUNTER — Encounter: Payer: Self-pay | Admitting: Physical Therapy

## 2015-09-20 DIAGNOSIS — Z5189 Encounter for other specified aftercare: Secondary | ICD-10-CM | POA: Diagnosis not present

## 2015-09-20 DIAGNOSIS — R2681 Unsteadiness on feet: Secondary | ICD-10-CM | POA: Diagnosis not present

## 2015-09-20 DIAGNOSIS — Z4789 Encounter for other orthopedic aftercare: Secondary | ICD-10-CM

## 2015-09-20 DIAGNOSIS — Z89512 Acquired absence of left leg below knee: Secondary | ICD-10-CM | POA: Diagnosis not present

## 2015-09-20 DIAGNOSIS — R269 Unspecified abnormalities of gait and mobility: Secondary | ICD-10-CM | POA: Diagnosis not present

## 2015-09-20 DIAGNOSIS — R531 Weakness: Secondary | ICD-10-CM | POA: Diagnosis not present

## 2015-09-20 DIAGNOSIS — R6889 Other general symptoms and signs: Secondary | ICD-10-CM

## 2015-09-20 NOTE — Therapy (Signed)
[facility] [address] [place], [place], [address] Phone: [phone]   Fax:  [phone]  Physical Therapy Treatment  Patient Details  Name: Jackie [name] MRN: [id number] Date of Birth: 1934/12/16 Referring Provider: Aldean Baker, MD  Encounter Date: 09/20/2015      PT End of Session - 09/20/15 1250    Visit Number 4   Number of Visits 18   Date for PT Re-Evaluation 11/08/15   Authorization Type Medicare G-code & progress report every 10th visit   PT Start Time 0931   PT Stop Time 1013   PT Time Calculation (min) 42 min   Equipment Utilized During Treatment Gait belt   Activity Tolerance Patient tolerated treatment well   Behavior During Therapy [facility] for tasks assessed/performed      Past Medical History  Diagnosis Date  . Hypertension   . Diabetes mellitus   . Thyroid disease     pt denies this  . Anemia   . Hyperlipidemia   . Arthritis   . Pneumonia   . End-stage renal disease on hemodialysis [facility])     dialysis T/Th/Sa    Past Surgical History  Procedure Laterality Date  . Arteriovenous graft placement Left     non function  . Eye surgery Bilateral     catarct  . Cesarean section    . Av fistula placement Right 01/30/2013    Procedure: ARTERIOVENOUS (AV) FISTULA CREATION;  Surgeon: Fransisco Hertz, MD;  Location: [facility] OR;  Service: Vascular;  Laterality: Right;  . Insertion of dialysis catheter Right 04/24/2013    Procedure: INSERTION OF DIALYSIS CATHETER;  Surgeon: Larina Earthly, MD;  Location: [facility] OR;  Service: Vascular;  Laterality: Right;  . Fistula superficialization Right 04/24/2013    Procedure: FISTULA SUPERFICIALIZATION;  Surgeon: Larina Earthly, MD;  Location: [facility] OR;  Service: Vascular;  Laterality: Right;  . Fistulogram Right 07/26/2013    Procedure: FISTULOGRAM;  Surgeon: Larina Earthly, MD;  Location: [facility] CATH LAB;  Service: Cardiovascular;  Laterality: Right;  . Peripheral vascular catheterization  N/A 12/05/2014    Procedure: Abdominal Aortogram;  Surgeon: Fransisco Hertz, MD;  Location: [facility] INVASIVE CV LAB;  Service: Cardiovascular;  Laterality: N/A;  . Peripheral vascular catheterization N/A 03/19/2015    Procedure: Lower Extremity Angiography;  Surgeon: Nada Libman, MD;  Location: [facility] INVASIVE CV LAB;  Service: Cardiovascular;  Laterality: N/A;  . Peripheral vascular catheterization  03/19/2015    Procedure: Peripheral Vascular Intervention;  Surgeon: Nada Libman, MD;  Location: [facility] INVASIVE CV LAB;  Service: Cardiovascular;;  left sfa and popliteal stent  . Tonsillectomy    . Amputation Left 04/24/2015    Procedure: LEFT BELOW KNEE AMPUTATION;  Surgeon: Nadara Mustard, MD;  Location: [facility] OR;  Service: Orthopedics;  Laterality: Left;    There were no vitals filed for this visit.  Visit Diagnosis:  Unsteadiness  Decreased functional activity tolerance  Abnormality of gait  Encounter for prosthetic gait training      Subjective Assessment - 09/20/15 0938    Subjective Patient reports no new pain, some soreness in the residual limb from time to time, no new falls. Patient states the prosthetic still feels good. Patient states HEP has been going well.   Patient is accompained by: Family member   Pertinent History HTN, DM, thyroid disease, HLD, arthritis, ESRD, pneumonia, syncope   Patient Stated Goals She wants to prosthesis to walk in community & home.  Currently in Pain? No/denies          [facility] Adult PT Treatment/Exercise - 09/20/15 0958    Ambulation/Gait   Ambulation/Gait Yes   Ambulation/Gait Assistance 5: Supervision   Ambulation/Gait Assistance Details pt has narrow BOS, decreased weight shift and stance on prosthesis. Walker adjusted lower, with patient still maintaining erect posture.   Ambulation Distance (Feet) 350 Feet   Assistive device Prosthesis;Rolling walker   Gait Pattern Decreased step length - right;Decreased stance time - left;Decreased stride  length;Decreased hip/knee flexion - left;Decreased weight shift to left;Left steppage;Antalgic;Trunk flexed;Abducted - left;Poor foot clearance - left;Step-through pattern   Ambulation Surface Level;Indoor   Ramp 5: Supervision   Ramp Details (indicate cue type and reason) Verbal cues for using heel of prosthesis on ramp descent.   Curb 5: Supervision   Curb Details (indicate cue type and reason) Verbal cues for proper sequencing.   Prosthetics   Current prosthetic wear tolerance (days/week)  daily since delivery   Current prosthetic wear tolerance (#hours/day)  reports wear 3-4 hrs 2x/day   Current prosthetic weight-bearing tolerance (hours/day)  Pt tolerates standing 20 minutes with partial weight on prosthesis with no pain or discomfort   Residual limb condition  no open areas, dry scaly skin, normal color, temperature.    Education Provided Skin check;Residual limb care;Correct ply sock adjustment;Proper Donning;Other (comment)   Person(s) Educated Patient   Education Method Explanation;Demonstration;Verbal cues   Education Method Verbalized understanding;Returned demonstration;Verbal cues required;Needs further instruction   Donning Prosthesis Minimal assist   Doffing Prosthesis Supervision      Therapeutic Exercise - Patient performed 1 set of 10 reps of each exercise from HEP Sink program with supervision and verbal cues for correction on technique including foot placement.       PT Education - 09/20/15 1249    Education provided Yes   Education Details Proper donning of prosthetic - patient was wearing sock underneath liner. Patient states she now understands liner is the innermost layer.   Person(s) Educated Patient   Methods Explanation;Demonstration;Verbal cues   Comprehension Verbalized understanding;Returned demonstration;Verbal cues required;Need further instruction          PT Short Term Goals - 09/20/15 1255    PT SHORT TERM GOAL #1   Title Patient demonstrates  proper donning/doffing, verbalizes proper cleaning & weighing /wearing at dialysis. (Target Date: 10/09/2015)   Time 1   Period Months   Status New   PT SHORT TERM GOAL #2   Title Patient tolerates prosthesis wear >10 hrs total /day without skin issues or tenderness. (Target Date: 10/09/2015)   Time 1   Period Months   Status New   PT SHORT TERM GOAL #3   Title Patient reaches 7" anteriorly, laterally, across midline and to floor with supervision. (Target Date: 10/09/2015)   Time 1   Period Months   Status New   PT SHORT TERM GOAL #4   Title Patient ambulates 200' with prosthesis & RW with supervision. (Target Date: 10/09/2015)   Baseline Achieved 09/20/2015 at 350' with prosthesis & RW with supervision.   Time 1   Period Months   Status Achieved   PT SHORT TERM GOAL #5   Title Patient negotiates ramps, curbs & stairs (2 rails) with RW & prosthesis with supervision. (Target Date: 10/09/2015)   Time 1   Period Months   Status New           PT Long Term Goals - 09/09/15 1015    PT LONG TERM  GOAL #1   Title Patient is independent with prosthetic care including using prosthesis at dialysis to enable safe utilization of prosthesis. (Target Date: 11/08/2015)   Time 2   Period Months   Status New   PT LONG TERM GOAL #2   Title Patient tolerates wear of prosthesis >90% of awake hours to enable function majority of her day. (Target Date: 11/08/2015)   Time 2   Period Months   Status New   PT LONG TERM GOAL #3   Title Berg Balance >/= 45/56 to indicate lower fall risk. (Target Date: 11/08/2015)   Time 2   Period Months   Status New   PT LONG TERM GOAL #4   Title Pateint ambulates 300' with LRAD & prosthesis including ramps & curbs modified independent to enable community mobility. (Target Date: 11/08/2015)   Time 2   Period Months   Status New   PT LONG TERM GOAL #5   Title Patient ambulates household distances with LRAD & prosthesis modified independent. (Target Date: 11/08/2015)    Time 2   Period Months   Status New          Plan - 09/20/15 1251    Clinical Impression Statement Skilled session addressing HEP compliance, prosthetic care, and gait with barriers. Patient has done well with HEP since last visit and understands exercises. Patient has continued to struggle with proper donning of prosthesis but now verbalizes and demonstrates proper order. Patient ambulated several times farther than before without fatigue and was able to negotiate ramp and curb with prosthesis and RW well with only verbal cues for sequencing on first attempt. Patient is making steady progress toward goals.   Pt will benefit from skilled therapeutic intervention in order to improve on the following deficits Abnormal gait;Decreased activity tolerance;Decreased balance;Decreased endurance;Decreased knowledge of precautions;Decreased knowledge of use of DME;Decreased mobility;Decreased strength;Postural dysfunction;Prosthetic Dependency   Rehab Potential Good   PT Frequency 2x / week   PT Duration Other (comment)  9 weeks (60 days)   PT Treatment/Interventions ADLs/Self Care Home Management;DME Instruction;Gait training;Stair training;Functional mobility training;Therapeutic activities;Therapeutic exercise;Balance training;Neuromuscular re-education;Patient/family education;Prosthetic Training   PT Next Visit Plan Review prosthetic care with patient, as she is struggling with sequencing. Continue with gait with barriers; consider adding stairs. Progress toward STGs.   PT Home Exercise Plan Reviewed sink exercises with patient.   Consulted and Agree with Plan of Care Patient        Problem List Patient Active Problem List   Diagnosis Date Noted  . S/P unilateral BKA (below knee amputation) (HCC) 04/24/2015  . 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 Leukocytosis, unspecified 04/22/2013  . HTN (hypertension), benign 04/18/2013  . Diabetes mellitus (HCC) 04/18/2013  . Syncope  04/18/2013  . Anemia 04/18/2013  . End stage renal disease (HCC) 09/02/2011    Otis Dialsrent Chyanne Kohut, SPTA 09/20/2015, 1:02 PM  [facility] [address] [place], [place], [address] Phone: [phone]   Fax:  [phone]  Name: Barbette HairBetty L [name] MRN: [id number] Date of Birth: 01/13/1935  This note has been reviewed and edited by supervising CI.  Sallyanne KusterKathy Bury, PTA, [facility] [address], [address] [place], [place] [address] [phone] 09/21/2015, 2:47 PM

## 2015-09-21 DIAGNOSIS — E1129 Type 2 diabetes mellitus with other diabetic kidney complication: Secondary | ICD-10-CM | POA: Diagnosis not present

## 2015-09-21 DIAGNOSIS — D631 Anemia in chronic kidney disease: Secondary | ICD-10-CM | POA: Diagnosis not present

## 2015-09-21 DIAGNOSIS — D509 Iron deficiency anemia, unspecified: Secondary | ICD-10-CM | POA: Diagnosis not present

## 2015-09-21 DIAGNOSIS — N186 End stage renal disease: Secondary | ICD-10-CM | POA: Diagnosis not present

## 2015-09-21 DIAGNOSIS — N2581 Secondary hyperparathyroidism of renal origin: Secondary | ICD-10-CM | POA: Diagnosis not present

## 2015-09-24 DIAGNOSIS — N2581 Secondary hyperparathyroidism of renal origin: Secondary | ICD-10-CM | POA: Diagnosis not present

## 2015-09-24 DIAGNOSIS — D509 Iron deficiency anemia, unspecified: Secondary | ICD-10-CM | POA: Diagnosis not present

## 2015-09-24 DIAGNOSIS — D631 Anemia in chronic kidney disease: Secondary | ICD-10-CM | POA: Diagnosis not present

## 2015-09-24 DIAGNOSIS — E1129 Type 2 diabetes mellitus with other diabetic kidney complication: Secondary | ICD-10-CM | POA: Diagnosis not present

## 2015-09-24 DIAGNOSIS — N186 End stage renal disease: Secondary | ICD-10-CM | POA: Diagnosis not present

## 2015-09-25 ENCOUNTER — Ambulatory Visit: Payer: Medicare Other | Admitting: Physical Therapy

## 2015-09-25 ENCOUNTER — Encounter: Payer: Self-pay | Admitting: Physical Therapy

## 2015-09-25 DIAGNOSIS — R531 Weakness: Secondary | ICD-10-CM

## 2015-09-25 DIAGNOSIS — R269 Unspecified abnormalities of gait and mobility: Secondary | ICD-10-CM | POA: Diagnosis not present

## 2015-09-25 DIAGNOSIS — Z89512 Acquired absence of left leg below knee: Secondary | ICD-10-CM

## 2015-09-25 DIAGNOSIS — Z5189 Encounter for other specified aftercare: Secondary | ICD-10-CM | POA: Diagnosis not present

## 2015-09-25 DIAGNOSIS — R6889 Other general symptoms and signs: Secondary | ICD-10-CM | POA: Diagnosis not present

## 2015-09-25 DIAGNOSIS — R2681 Unsteadiness on feet: Secondary | ICD-10-CM | POA: Diagnosis not present

## 2015-09-25 DIAGNOSIS — Z4789 Encounter for other orthopedic aftercare: Secondary | ICD-10-CM

## 2015-09-25 DIAGNOSIS — R2689 Other abnormalities of gait and mobility: Secondary | ICD-10-CM

## 2015-09-25 NOTE — Therapy (Signed)
[facility] [address] [place], [place], [address] Phone: [phone]   Fax:  [phone]  Physical Therapy Treatment  Patient Details  Name: Jackie [name] MRN: [id number] Date of Birth: May 03, 1935 Referring Provider: Aldean Baker, MD  Encounter Date: 09/25/2015      PT End of Session - 09/25/15 1350    Visit Number 5   Number of Visits 18   Date for PT Re-Evaluation 11/08/15   Authorization Type Medicare G-code & progress report every 10th visit   PT Start Time 0830   PT Stop Time 0931   PT Time Calculation (min) 61 min   Equipment Utilized During Treatment Gait belt   Activity Tolerance Patient tolerated treatment well   Behavior During Therapy [facility] for tasks assessed/performed      Past Medical History  Diagnosis Date  . Hypertension   . Diabetes mellitus   . Thyroid disease     pt denies this  . Anemia   . Hyperlipidemia   . Arthritis   . Pneumonia   . End-stage renal disease on hemodialysis [facility])     dialysis T/Th/Sa    Past Surgical History  Procedure Laterality Date  . Arteriovenous graft placement Left     non function  . Eye surgery Bilateral     catarct  . Cesarean section    . Av fistula placement Right 01/30/2013    Procedure: ARTERIOVENOUS (AV) FISTULA CREATION;  Surgeon: Fransisco Hertz, MD;  Location: [facility] OR;  Service: Vascular;  Laterality: Right;  . Insertion of dialysis catheter Right 04/24/2013    Procedure: INSERTION OF DIALYSIS CATHETER;  Surgeon: Larina Earthly, MD;  Location: [facility] OR;  Service: Vascular;  Laterality: Right;  . Fistula superficialization Right 04/24/2013    Procedure: FISTULA SUPERFICIALIZATION;  Surgeon: Larina Earthly, MD;  Location: [facility] OR;  Service: Vascular;  Laterality: Right;  . Fistulogram Right 07/26/2013    Procedure: FISTULOGRAM;  Surgeon: Larina Earthly, MD;  Location: [facility] CATH LAB;  Service: Cardiovascular;  Laterality: Right;  . Peripheral vascular catheterization  N/A 12/05/2014    Procedure: Abdominal Aortogram;  Surgeon: Fransisco Hertz, MD;  Location: [facility] INVASIVE CV LAB;  Service: Cardiovascular;  Laterality: N/A;  . Peripheral vascular catheterization N/A 03/19/2015    Procedure: Lower Extremity Angiography;  Surgeon: Nada Libman, MD;  Location: [facility] INVASIVE CV LAB;  Service: Cardiovascular;  Laterality: N/A;  . Peripheral vascular catheterization  03/19/2015    Procedure: Peripheral Vascular Intervention;  Surgeon: Nada Libman, MD;  Location: [facility] INVASIVE CV LAB;  Service: Cardiovascular;;  left sfa and popliteal stent  . Tonsillectomy    . Amputation Left 04/24/2015    Procedure: LEFT BELOW KNEE AMPUTATION;  Surgeon: Nadara Mustard, MD;  Location: [facility] OR;  Service: Orthopedics;  Laterality: Left;    There were no vitals filed for this visit.  Visit Diagnosis:  Unsteadiness  Decreased functional activity tolerance  Abnormality of gait  Encounter for prosthetic gait training  Status post below knee amputation of left lower extremity (HCC)  Balance problems  Weakness generalized      Subjective Assessment - 09/25/15 0834    Subjective On non-dialysis days wears prosthesis 4 hrs first wear & 3 hrs 2nd wear.    Pertinent History HTN, DM, thyroid disease, HLD, arthritis, ESRD, pneumonia, syncope   Patient Stated Goals She wants to prosthesis to walk in community & home.    Currently in Pain? No/denies  [facility] Adult PT Treatment/Exercise - 09/25/15 0830    Ambulation/Gait   Ambulation/Gait Yes   Ambulation/Gait Assistance 5: Supervision   Ambulation/Gait Assistance Details tactile & verbal cues on step thru pattern. PT adjusted pt's RW to facilitate more upright posture.   Ambulation Distance (Feet) 150 Feet  150' X 2   Assistive device Prosthesis;Rolling walker   Ambulation Surface Indoor;Level                  PT Short Term Goals - 09/25/15 1350    PT SHORT TERM GOAL #1   Title  Patient demonstrates proper donning/doffing, verbalizes proper cleaning & weighing /wearing at dialysis. (Target Date: 10/09/2015)   Time 1   Period Months   Status On-going   PT SHORT TERM GOAL #2   Title Patient tolerates prosthesis wear >10 hrs total /day without skin issues or tenderness. (Target Date: 10/09/2015)   Time 1   Period Months   Status On-going   PT SHORT TERM GOAL #3   Title Patient reaches 7" anteriorly, laterally, across midline and to floor with supervision. (Target Date: 10/09/2015)   Time 1   Period Months   Status On-going   PT SHORT TERM GOAL #4   Title Patient ambulates 200' with prosthesis & RW with supervision. (Target Date: 10/09/2015)   Baseline Achieved 09/20/2015 at 350' with prosthesis & RW with supervision.   Time 1   Period Months   Status Achieved   PT SHORT TERM GOAL #5   Title Patient negotiates ramps, curbs & stairs (2 rails) with RW & prosthesis with supervision. (Target Date: 10/09/2015)   Time 1   Period Months   Status On-going           PT Long Term Goals - 09/09/15 1015    PT LONG TERM GOAL #1   Title Patient is independent with prosthetic care including using prosthesis at dialysis to enable safe utilization of prosthesis. (Target Date: 11/08/2015)   Time 2   Period Months   Status New   PT LONG TERM GOAL #2   Title Patient tolerates wear of prosthesis >90% of awake hours to enable function majority of her day. (Target Date: 11/08/2015)   Time 2   Period Months   Status New   PT LONG TERM GOAL #3   Title Berg Balance >/= 45/56 to indicate lower fall risk. (Target Date: 11/08/2015)   Time 2   Period Months   Status New   PT LONG TERM GOAL #4   Title Pateint ambulates 300' with LRAD & prosthesis including ramps & curbs modified independent to enable community mobility. (Target Date: 11/08/2015)   Time 2   Period Months   Status New   PT LONG TERM GOAL #5   Title Patient ambulates household distances with LRAD & prosthesis modified  independent. (Target Date: 11/08/2015)   Time 2   Period Months   Status New               Plan - 09/25/15 1351    Clinical Impression Statement Patient has better understanding of residual limb & prosthesis cleaning, wear schedule including dialysis days and preliminary issues with wearing prosthesis at dialysis. Patient improved her gait with step thru pattern instruction.    Pt will benefit from skilled therapeutic intervention in order to improve on the following deficits Abnormal gait;Decreased activity tolerance;Decreased balance;Decreased endurance;Decreased knowledge of precautions;Decreased knowledge of use of DME;Decreased mobility;Decreased strength;Postural dysfunction;Prosthetic Dependency   Rehab Potential Good  PT Frequency 2x / week   PT Duration Other (comment)  9 weeks (60 days)   PT Treatment/Interventions ADLs/Self Care Home Management;DME Instruction;Gait training;Stair training;Functional mobility training;Therapeutic activities;Therapeutic exercise;Balance training;Neuromuscular re-education;Patient/family education;Prosthetic Training   PT Next Visit Plan Review prosthetic care with patient, as she is struggling with sequencing. Continue with gait with barriers including stairs. Progress toward STGs.   PT Home Exercise Plan Reviewed sink exercises with patient.   Consulted and Agree with Plan of Care Patient        Problem List Patient Active Problem List   Diagnosis Date Noted  . S/P unilateral BKA (below knee amputation) (HCC) 04/24/2015  . 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 Leukocytosis, unspecified 04/22/2013  . HTN (hypertension), benign 04/18/2013  . Diabetes mellitus (HCC) 04/18/2013  . Syncope 04/18/2013  . Anemia 04/18/2013  . End stage renal disease (HCC) 09/02/2011    Laurel Harnden PT, DPT 09/25/2015, 1:54 PM  [facility] [address] [place], [place],  [address] Phone: [phone]   Fax:  [phone]  Name: Jackie [name] MRN: [id number] Date of Birth: 02/11/1935

## 2015-09-26 DIAGNOSIS — N2581 Secondary hyperparathyroidism of renal origin: Secondary | ICD-10-CM | POA: Diagnosis not present

## 2015-09-26 DIAGNOSIS — E1129 Type 2 diabetes mellitus with other diabetic kidney complication: Secondary | ICD-10-CM | POA: Diagnosis not present

## 2015-09-26 DIAGNOSIS — D631 Anemia in chronic kidney disease: Secondary | ICD-10-CM | POA: Diagnosis not present

## 2015-09-26 DIAGNOSIS — D509 Iron deficiency anemia, unspecified: Secondary | ICD-10-CM | POA: Diagnosis not present

## 2015-09-26 DIAGNOSIS — N186 End stage renal disease: Secondary | ICD-10-CM | POA: Diagnosis not present

## 2015-09-27 ENCOUNTER — Encounter: Payer: Self-pay | Admitting: Physical Therapy

## 2015-09-27 ENCOUNTER — Ambulatory Visit: Payer: Medicare Other | Admitting: Physical Therapy

## 2015-09-27 DIAGNOSIS — R531 Weakness: Secondary | ICD-10-CM | POA: Diagnosis not present

## 2015-09-27 DIAGNOSIS — R269 Unspecified abnormalities of gait and mobility: Secondary | ICD-10-CM

## 2015-09-27 DIAGNOSIS — R2681 Unsteadiness on feet: Secondary | ICD-10-CM | POA: Diagnosis not present

## 2015-09-27 DIAGNOSIS — Z89512 Acquired absence of left leg below knee: Secondary | ICD-10-CM | POA: Diagnosis not present

## 2015-09-27 DIAGNOSIS — Z5189 Encounter for other specified aftercare: Secondary | ICD-10-CM | POA: Diagnosis not present

## 2015-09-27 DIAGNOSIS — Z4789 Encounter for other orthopedic aftercare: Secondary | ICD-10-CM

## 2015-09-27 DIAGNOSIS — R6889 Other general symptoms and signs: Secondary | ICD-10-CM | POA: Diagnosis not present

## 2015-09-27 NOTE — Therapy (Signed)
[facility] [address] [place], [place], [address] Phone: [phone]   Fax:  [phone]  Physical Therapy Treatment  Patient Details  Name: Jackie [name] MRN: [id number] Date of Birth: 07/21/1934 Referring Provider: Aldean BakerMarcus Duda, MD  Encounter Date: 09/27/2015      PT End of Session - 09/27/15 1319    Visit Number 6   Number of Visits 18   Date for PT Re-Evaluation 11/08/15   Authorization Type Medicare G-code & progress report every 10th visit   PT Start Time 1231   PT Stop Time 1316   PT Time Calculation (min) 45 min   Equipment Utilized During Treatment Gait belt   Activity Tolerance Patient tolerated treatment well   Behavior During Therapy [facility] for tasks assessed/performed      Past Medical History  Diagnosis Date  . Hypertension   . Diabetes mellitus   . Thyroid disease     pt denies this  . Anemia   . Hyperlipidemia   . Arthritis   . Pneumonia   . End-stage renal disease on hemodialysis [facility](HCC)     dialysis T/Th/Sa    Past Surgical History  Procedure Laterality Date  . Arteriovenous graft placement Left     non function  . Eye surgery Bilateral     catarct  . Cesarean section    . Av fistula placement Right 01/30/2013    Procedure: ARTERIOVENOUS (AV) FISTULA CREATION;  Surgeon: Fransisco HertzBrian L Chen, MD;  [facility];  Service: Vascular;  Laterality: Right;  . Insertion of dialysis catheter Right 04/24/2013    Procedure: INSERTION OF DIALYSIS CATHETER;  Surgeon: Larina Earthlyodd F Early, MD;  [facility];  Service: Vascular;  Laterality: Right;  . Fistula superficialization Right 04/24/2013    Procedure: FISTULA SUPERFICIALIZATION;  Surgeon: Larina Earthlyodd F Early, MD;  [facility];  Service: Vascular;  Laterality: Right;  . Fistulogram Right 07/26/2013    Procedure: FISTULOGRAM;  Surgeon: Larina Earthlyodd F Early, MD;  [facility];  Service: Cardiovascular;  Laterality: Right;  . Peripheral vascular catheterization  N/A 12/05/2014    Procedure: Abdominal Aortogram;  Surgeon: Fransisco HertzBrian L Chen, MD;  [facility];  Service: Cardiovascular;  Laterality: N/A;  . Peripheral vascular catheterization N/A 03/19/2015    Procedure: Lower Extremity Angiography;  Surgeon: Nada LibmanVance W Brabham, MD;  Location: [facility] INVASIVE CV LAB;  Service: Cardiovascular;  Laterality: N/A;  . Peripheral vascular catheterization  03/19/2015    Procedure: Peripheral Vascular Intervention;  Surgeon: Nada LibmanVance W Brabham, MD;  [facility];  Service: Cardiovascular;;  left sfa and popliteal stent  . Tonsillectomy    . Amputation Left 04/24/2015    Procedure: LEFT BELOW KNEE AMPUTATION;  Surgeon: Nadara MustardMarcus Duda V, MD;  Location: [facility] OR;  Service: Orthopedics;  Laterality: Left;    There were no vitals filed for this visit.  Visit Diagnosis:  Unsteadiness  Abnormality of gait  Encounter for prosthetic gait training      Subjective Assessment - 09/27/15 1235    Subjective On non-dialysis days patient reports wearing prosthesis 8 hrs first wear & 5 hrs 2nd wear. No new falls.    Pertinent History HTN, DM, thyroid disease, HLD, arthritis, ESRD, pneumonia, syncope   Patient Stated Goals She wants to prosthesis to walk in community & home.    Currently in Pain? No/denies          [facility] Adult PT Treatment/Exercise - 09/27/15 1317  Ambulation/Gait   Ambulation/Gait Yes   Ambulation/Gait Assistance 5: Supervision   Ambulation/Gait Assistance Details Verbal cues for increased heel strike and safety awareness   Ambulation Distance (Feet) 150 Feet   Assistive device Prosthesis;Rolling walker   Gait Pattern Decreased step length - right;Decreased stance time - left;Decreased stride length;Decreased hip/knee flexion - left;Decreased weight shift to left;Left steppage;Antalgic;Trunk flexed;Abducted - left;Poor foot clearance - left;Step-through pattern   Ambulation Surface Level;Indoor   Ramp 5: Supervision   Ramp Details  (indicate cue type and reason) Verbal cues for increased safety awareness   Curb 5: Supervision   Curb Details (indicate cue type and reason) Verbal cues for increased safety awareness.   Prosthetics   Prosthetic Care Comments  Patient was again wearing sock under liner. Patient was unaware of proper wearing schedule.   Current prosthetic wear tolerance (days/week)  4 days /week; not wearing on dialysis days   Current prosthetic weight-bearing tolerance (hours/day)  Pt tolerates standing 30 minutes with partial weight on prosthesis with no pain or discomfort   Residual limb condition  no open areas, dry scaly skin, normal color, temperature.    Education Provided Residual limb care;Prosthetic cleaning;Correct ply sock adjustment;Proper Donning;Proper wear schedule/adjustment;Proper weight-bearing schedule/adjustment  Wearing schedule adjusted to 3 on/1 off repeating till bed.   Person(s) Educated Patient   Education Method Explanation;Demonstration;Tactile cues;Verbal cues;Handout   Education Method Verbalized understanding;Returned demonstration;Verbal cues required;Needs further instruction   Donning Prosthesis Minimal assist   Doffing Prosthesis Supervision          PT Education - 09/27/15 1316    Education provided Yes   Education Details Proper donning of prosthesis - patient was again wearing sock under prosthesis.    Person(s) Educated Patient   Methods Explanation;Demonstration;Tactile cues;Verbal cues;Handout   Comprehension Verbalized understanding;Returned demonstration;Verbal cues required;Need further instruction          PT Short Term Goals - 09/25/15 1350    PT SHORT TERM GOAL #1   Title Patient demonstrates proper donning/doffing, verbalizes proper cleaning & weighing /wearing at dialysis. (Target Date: 10/09/2015)   Time 1   Period Months   Status On-going   PT SHORT TERM GOAL #2   Title Patient tolerates prosthesis wear >10 hrs total /day without skin issues or  tenderness. (Target Date: 10/09/2015)   Time 1   Period Months   Status On-going   PT SHORT TERM GOAL #3   Title Patient reaches 7" anteriorly, laterally, across midline and to floor with supervision. (Target Date: 10/09/2015)   Time 1   Period Months   Status On-going   PT SHORT TERM GOAL #4   Title Patient ambulates 200' with prosthesis & RW with supervision. (Target Date: 10/09/2015)   Baseline Achieved 09/20/2015 at 350' with prosthesis & RW with supervision.   Time 1   Period Months   Status Achieved   PT SHORT TERM GOAL #5   Title Patient negotiates ramps, curbs & stairs (2 rails) with RW & prosthesis with supervision. (Target Date: 10/09/2015)   Time 1   Period Months   Status On-going           PT Long Term Goals - 09/09/15 1015    PT LONG TERM GOAL #1   Title Patient is independent with prosthetic care including using prosthesis at dialysis to enable safe utilization of prosthesis. (Target Date: 11/08/2015)   Time 2   Period Months   Status New   PT LONG TERM GOAL #2   Title Patient  tolerates wear of prosthesis >90% of awake hours to enable function majority of her day. (Target Date: 11/08/2015)   Time 2   Period Months   Status New   PT LONG TERM GOAL #3   Title Berg Balance >/= 45/56 to indicate lower fall risk. (Target Date: 11/08/2015)   Time 2   Period Months   Status New   PT LONG TERM GOAL #4   Title Pateint ambulates 300' with LRAD & prosthesis including ramps & curbs modified independent to enable community mobility. (Target Date: 11/08/2015)   Time 2   Period Months   Status New   PT LONG TERM GOAL #5   Title Patient ambulates household distances with LRAD & prosthesis modified independent. (Target Date: 11/08/2015)   Time 2   Period Months   Status New          Plan - 09/27/15 1602    Clinical Impression Statement Skilled session addressing deficits in prosthetic care and gait with barriers. Patient still unsure of proper donning of prosthesis or  wearing schedule despite education in previous sessions. Patient given thorough review of prosthetic care including handout for wearing schedule, proper donning, and guidelines for drying liner and residual limb.   Pt will benefit from skilled therapeutic intervention in order to improve on the following deficits Abnormal gait;Decreased activity tolerance;Decreased balance;Decreased endurance;Decreased knowledge of precautions;Decreased knowledge of use of DME;Decreased mobility;Decreased strength;Postural dysfunction;Prosthetic Dependency   Rehab Potential Good   PT Frequency 2x / week   PT Duration Other (comment)  9 weeks (60 days)   PT Treatment/Interventions ADLs/Self Care Home Management;DME Instruction;Gait training;Stair training;Functional mobility training;Therapeutic activities;Therapeutic exercise;Balance training;Neuromuscular re-education;Patient/family education;Prosthetic Training   PT Next Visit Plan Review prosthetic care with patient, as she is still struggling with sequencing. Continue with gait with barriers including stairs. Progress toward STGs.   PT Home Exercise Plan Reviewed prosthetic care with patient. Issued handout.   Consulted and Agree with Plan of Care Patient        Problem List Patient Active Problem List   Diagnosis Date Noted  . S/P unilateral BKA (below knee amputation) (HCC) 04/24/2015  . 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 Leukocytosis, unspecified 04/22/2013  . HTN (hypertension), benign 04/18/2013  . Diabetes mellitus (HCC) 04/18/2013  . Syncope 04/18/2013  . Anemia 04/18/2013  . End stage renal disease (HCC) 09/02/2011    Otis Dials, SPTA 09/27/2015, 4:05 PM  [place] [facility] [address] [place], [place], [address] Phone: [phone]   Fax:  [phone]  Name: Jackie [name] MRN: [id number] Date of Birth: [birth year]  This note has been reviewed and edited by  supervising CI.  Sallyanne Kuster, PTA, [facility] [address] [place], [place] [address] [phone] 09/29/2015, 11:09 PM

## 2015-09-27 NOTE — Patient Instructions (Addendum)
Instructions for putting on prosthesis:  1. Liner goes on first. Liner ALWAYS goes directly on your skin.  2. Ply socks go on next. Ply socks go onto the liner.  3. Last, put prosthesis on over socks.   Wear prosthesis 3 hours, then take prosthesis off to dry both the liner and your leg. Have one hour off, then put the prosthesis back on for 3 more hours. Continue this until bed.   Always make sure your leg and the liner are both dry before putting on the prosthesis.  Signs that you need to dry your leg and the liner:  1. You are actively sweating. If you're sweating, your leg is sweating.  2. You notice your liner has been sliding. If it has slid, it is probably wet and needs to be dried.  3. You feel a sensation like bugs are crawling under the liner - this is likely beads of sweat.

## 2015-09-28 DIAGNOSIS — E1129 Type 2 diabetes mellitus with other diabetic kidney complication: Secondary | ICD-10-CM | POA: Diagnosis not present

## 2015-09-28 DIAGNOSIS — N186 End stage renal disease: Secondary | ICD-10-CM | POA: Diagnosis not present

## 2015-09-28 DIAGNOSIS — N2581 Secondary hyperparathyroidism of renal origin: Secondary | ICD-10-CM | POA: Diagnosis not present

## 2015-09-28 DIAGNOSIS — D509 Iron deficiency anemia, unspecified: Secondary | ICD-10-CM | POA: Diagnosis not present

## 2015-09-28 DIAGNOSIS — D631 Anemia in chronic kidney disease: Secondary | ICD-10-CM | POA: Diagnosis not present

## 2015-10-01 DIAGNOSIS — D631 Anemia in chronic kidney disease: Secondary | ICD-10-CM | POA: Diagnosis not present

## 2015-10-01 DIAGNOSIS — D509 Iron deficiency anemia, unspecified: Secondary | ICD-10-CM | POA: Diagnosis not present

## 2015-10-01 DIAGNOSIS — E1129 Type 2 diabetes mellitus with other diabetic kidney complication: Secondary | ICD-10-CM | POA: Diagnosis not present

## 2015-10-01 DIAGNOSIS — N2581 Secondary hyperparathyroidism of renal origin: Secondary | ICD-10-CM | POA: Diagnosis not present

## 2015-10-01 DIAGNOSIS — N186 End stage renal disease: Secondary | ICD-10-CM | POA: Diagnosis not present

## 2015-10-02 ENCOUNTER — Ambulatory Visit: Payer: Medicare Other | Admitting: Physical Therapy

## 2015-10-02 ENCOUNTER — Encounter: Payer: Self-pay | Admitting: Physical Therapy

## 2015-10-02 DIAGNOSIS — R6889 Other general symptoms and signs: Secondary | ICD-10-CM

## 2015-10-02 DIAGNOSIS — R2681 Unsteadiness on feet: Secondary | ICD-10-CM | POA: Diagnosis not present

## 2015-10-02 DIAGNOSIS — Z89512 Acquired absence of left leg below knee: Secondary | ICD-10-CM | POA: Diagnosis not present

## 2015-10-02 DIAGNOSIS — R531 Weakness: Secondary | ICD-10-CM | POA: Diagnosis not present

## 2015-10-02 DIAGNOSIS — R2689 Other abnormalities of gait and mobility: Secondary | ICD-10-CM

## 2015-10-02 DIAGNOSIS — R269 Unspecified abnormalities of gait and mobility: Secondary | ICD-10-CM

## 2015-10-02 DIAGNOSIS — Z5189 Encounter for other specified aftercare: Secondary | ICD-10-CM | POA: Diagnosis not present

## 2015-10-02 DIAGNOSIS — Z4789 Encounter for other orthopedic aftercare: Secondary | ICD-10-CM

## 2015-10-02 NOTE — Therapy (Signed)
[facility] [address] [place], [place], [address] Phone: [phone]   Fax:  [phone]  Physical Therapy Treatment  Patient Details  Name: Jackie [name] MRN: [id number] Date of Birth: 22-Mar-1935 Referring Provider: Aldean Baker, MD  Encounter Date: 10/02/2015      PT End of Session - 10/02/15 1015    Visit Number 7   Number of Visits 18   Date for PT Re-Evaluation 11/08/15   Authorization Type Medicare G-code & progress report every 10th visit   PT Start Time 0930   PT Stop Time 1014   PT Time Calculation (min) 44 min   Equipment Utilized During Treatment Gait belt   Activity Tolerance Patient tolerated treatment well   Behavior During Therapy [facility] for tasks assessed/performed      Past Medical History  Diagnosis Date  . Hypertension   . Diabetes mellitus   . Thyroid disease     pt denies this  . Anemia   . Hyperlipidemia   . Arthritis   . Pneumonia   . End-stage renal disease on hemodialysis [facility])     dialysis T/Th/Sa    Past Surgical History  Procedure Laterality Date  . Arteriovenous graft placement Left     non function  . Eye surgery Bilateral     catarct  . Cesarean section    . Av fistula placement Right 01/30/2013    Procedure: ARTERIOVENOUS (AV) FISTULA CREATION;  Surgeon: Fransisco Hertz, MD;  Location: [facility] OR;  Service: Vascular;  Laterality: Right;  . Insertion of dialysis catheter Right 04/24/2013    Procedure: INSERTION OF DIALYSIS CATHETER;  Surgeon: Larina Earthly, MD;  [facility];  Service: Vascular;  Laterality: Right;  . Fistula superficialization Right 04/24/2013    Procedure: FISTULA SUPERFICIALIZATION;  Surgeon: Larina Earthly, MD;  Location: [facility] OR;  Service: Vascular;  Laterality: Right;  . Fistulogram Right 07/26/2013    Procedure: FISTULOGRAM;  Surgeon: Larina Earthly, MD;  Location[facility] CATH LAB;  Service: Cardiovascular;  Laterality: Right;  . Peripheral vascular catheterization  N/A 12/05/2014    Procedure: Abdominal Aortogram;  Surgeon: Fransisco Hertz, MD;  Location: [facility] INVASIVE CV LAB;  Service: Cardiovascular;  Laterality: N/A;  . Peripheral vascular catheterization N/A 03/19/2015    Procedure: Lower Extremity Angiography;  Surgeon: Nada Libman, MD;  Location: [facility] INVASIVE CV LAB;  Service: Cardiovascular;  Laterality: N/A;  . Peripheral vascular catheterization  03/19/2015    Procedure: Peripheral Vascular Intervention;  Surgeon: Nada Libman, MD;  [facility] INVASIVE CV LAB;  Service: Cardiovascular;;  left sfa and popliteal stent  . Tonsillectomy    . Amputation Left 04/24/2015    Procedure: LEFT BELOW KNEE AMPUTATION;  Surgeon: Nadara Mustard, MD;  Location: [facility] OR;  Service: Orthopedics;  Laterality: Left;    There were no vitals filed for this visit.  Visit Diagnosis:  Unsteadiness  Abnormality of gait  Encounter for prosthetic gait training  Decreased functional activity tolerance  Status post below knee amputation of left lower extremity (HCC)  Balance problems  Weakness generalized      Subjective Assessment - 10/02/15 0930    Subjective Patient reports wear 4 hrs 2x/day on dialysis days and ~3 hrs before & after dialysis days without issues.    Pertinent History HTN, DM, thyroid disease, HLD, arthritis, ESRD, pneumonia, syncope   Patient Stated Goals She wants to prosthesis to walk in community & home.    Currently  in Pain? No/denies                         [facility] Adult PT Treatment/Exercise - 10/02/15 0930    Ambulation/Gait   Ambulation/Gait Yes   Ambulation/Gait Assistance 5: Supervision   Ambulation/Gait Assistance Details cues on posture, step length & weight shift   Ambulation Distance (Feet) 250 Feet  250' & 100' X 2 with RW, 15' along counter with cane   Assistive device Prosthesis;Rolling walker;Straight cane  left UE counter support & RUE cane for 15'   Gait Pattern Decreased step length - right;Decreased  stance time - left;Decreased stride length;Decreased hip/knee flexion - left;Decreased weight shift to left;Left steppage;Antalgic;Trunk flexed;Abducted - left;Poor foot clearance - left;Step-through pattern   Ambulation Surface Indoor;Level   Stairs Yes   Stairs Assistance 5: Supervision   Stairs Assistance Details (indicate cue type and reason) cues on sequence & technique   Stair Management Technique Two rails;Step to pattern;Forwards   Number of Stairs 4   Ramp 5: Supervision  prosthesis & RW   Ramp Details (indicate cue type and reason) cues on technique, posture & wt shift   Curb 5: Supervision  prosthesis & RW   Curb Details (indicate cue type and reason) cues on technique including sequence   Balance   Balance Assessed Yes   Dynamic Standing Balance   Reaching for objects comments: standing at counter with RW near for safety /support as needed: reaching forward, right, across midline, to upper cabinet, to floor and looking over shoulders.   see pt instructions   Prosthetics   Prosthetic Care Comments  Patient arrived with prosthesis properly donned & after skin check redonned correctly.    Current prosthetic wear tolerance (days/week)  wears daily including dialysis days   Current prosthetic wear tolerance (#hours/day)  dialysis days: wear all out of bed hours when not at dialysis and Non-Dialysis days increase to 5 hrs 2x/day   Current prosthetic weight-bearing tolerance (hours/day)  --   Residual limb condition  normal moisture, no open areas, clean skin   Education Provided Residual limb care;Prosthetic cleaning;Correct ply sock adjustment;Proper Donning;Proper wear schedule/adjustment;Proper weight-bearing schedule/adjustment   Person(s) Educated Patient   Education Method Explanation;Demonstration;Tactile cues;Verbal cues   Education Method Verbalized understanding;Returned demonstration;Tactile cues required;Verbal cues required;Needs further instruction                 PT Education - 10/02/15 1015    Education provided Yes   Education Details balance activities at [employer]) Educated Patient   Methods Explanation;Demonstration;Tactile cues;Verbal cues;Handout   Comprehension Verbalized understanding;Returned demonstration;Verbal cues required;Tactile cues required          PT Short Term Goals - 09/25/15 1350    PT SHORT TERM GOAL #1   Title Patient demonstrates proper donning/doffing, verbalizes proper cleaning & weighing /wearing at dialysis. (Target Date: 10/09/2015)   Time 1   Period Months   Status On-going   PT SHORT TERM GOAL #2   Title Patient tolerates prosthesis wear >10 hrs total /day without skin issues or tenderness. (Target Date: 10/09/2015)   Time 1   Period Months   Status On-going   PT SHORT TERM GOAL #3   Title Patient reaches 7" anteriorly, laterally, across midline and to floor with supervision. (Target Date: 10/09/2015)   Time 1   Period Months   Status On-going   PT SHORT TERM GOAL #4   Title Patient ambulates 200' with prosthesis &  RW with supervision. (Target Date: 10/09/2015)   Baseline Achieved 09/20/2015 at 350' with prosthesis & RW with supervision.   Time 1   Period Months   Status Achieved   PT SHORT TERM GOAL #5   Title Patient negotiates ramps, curbs & stairs (2 rails) with RW & prosthesis with supervision. (Target Date: 10/09/2015)   Time 1   Period Months   Status On-going           PT Long Term Goals - 09/09/15 1015    PT LONG TERM GOAL #1   Title Patient is independent with prosthetic care including using prosthesis at dialysis to enable safe utilization of prosthesis. (Target Date: 11/08/2015)   Time 2   Period Months   Status New   PT LONG TERM GOAL #2   Title Patient tolerates wear of prosthesis >90% of awake hours to enable function majority of her day. (Target Date: 11/08/2015)   Time 2   Period Months   Status New   PT LONG TERM GOAL #3   Title Berg Balance >/= 45/56 to indicate  lower fall risk. (Target Date: 11/08/2015)   Time 2   Period Months   Status New   PT LONG TERM GOAL #4   Title Pateint ambulates 300' with LRAD & prosthesis including ramps & curbs modified independent to enable community mobility. (Target Date: 11/08/2015)   Time 2   Period Months   Status New   PT LONG TERM GOAL #5   Title Patient ambulates household distances with LRAD & prosthesis modified independent. (Target Date: 11/08/2015)   Time 2   Period Months   Status New               Plan - 10/02/15 1015    Clinical Impression Statement Patient appears to understand donning better today. Patient was able to improve balance with reaching activities near counter for security & verbalizes understanding how to safely practice at home. Patient improved ability to negotiate barriers.    Pt will benefit from skilled therapeutic intervention in order to improve on the following deficits Abnormal gait;Decreased activity tolerance;Decreased balance;Decreased endurance;Decreased knowledge of precautions;Decreased knowledge of use of DME;Decreased mobility;Decreased strength;Postural dysfunction;Prosthetic Dependency   Rehab Potential Good   PT Frequency 2x / week   PT Duration Other (comment)  9 weeks (60 days)   PT Treatment/Interventions ADLs/Self Care Home Management;DME Instruction;Gait training;Stair training;Functional mobility training;Therapeutic activities;Therapeutic exercise;Balance training;Neuromuscular re-education;Patient/family education;Prosthetic Training   PT Next Visit Plan assess STGs next week, review prosthetic care. initiate gait with cane   PT Home Exercise Plan Reviewed prosthetic care with patient. Issued handout.   Consulted and Agree with Plan of Care Patient        Problem List Patient Active Problem List   Diagnosis Date Noted  . S/P unilateral BKA (below knee amputation) (HCC) 04/24/2015  . 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  Leukocytosis, unspecified 04/22/2013  . HTN (hypertension), benign 04/18/2013  . Diabetes mellitus (HCC) 04/18/2013  . Syncope 04/18/2013  . Anemia 04/18/2013  . End stage renal disease (HCC) 09/02/2011    Donalyn Schneeberger PT, DPT 10/02/2015, 8:42 PM  [facility] [address] [place], [place], [address] Phone: [phone]   Fax:  [phone]  Name: Jackie [name] MRN: [id number] Date of Birth: 02/03/1935

## 2015-10-02 NOTE — Patient Instructions (Signed)
Stand at counter with walker behind you for safety. Do not hold on but if you are off balance, then touch counter or walker to catch yourself. Facing counter:   1. Move cup forward to back of counter, then stand up straight. Reach forward to pick up cup and move it to front of counter. Do 10 times.  2. Move cup to your right as far as possible and put it down. Stand up straight. Reach over to pick up cup and move it back in front of you. Do 10 times.  3. Move cup to your left using right hand so reaching across your body. Do 10 times.  4. Place cup in upper cabinet on highest shelf. Retrieve cup & place on counter. Do 10 times. Back to counter but not touching & walker in front but not touching:  1. Place cup on floor in front of you. Pick up cup. Do 10 times.  2. Turn to look over your right shoulder. Turn to look over your left shoulder. Do 10 times both ways.   3. Lean back & look up over your head. Do 10 times.

## 2015-10-03 DIAGNOSIS — D509 Iron deficiency anemia, unspecified: Secondary | ICD-10-CM | POA: Diagnosis not present

## 2015-10-03 DIAGNOSIS — N2581 Secondary hyperparathyroidism of renal origin: Secondary | ICD-10-CM | POA: Diagnosis not present

## 2015-10-03 DIAGNOSIS — E1129 Type 2 diabetes mellitus with other diabetic kidney complication: Secondary | ICD-10-CM | POA: Diagnosis not present

## 2015-10-03 DIAGNOSIS — D631 Anemia in chronic kidney disease: Secondary | ICD-10-CM | POA: Diagnosis not present

## 2015-10-03 DIAGNOSIS — N186 End stage renal disease: Secondary | ICD-10-CM | POA: Diagnosis not present

## 2015-10-04 ENCOUNTER — Encounter: Payer: Self-pay | Admitting: Physical Therapy

## 2015-10-04 ENCOUNTER — Ambulatory Visit: Payer: Medicare Other | Admitting: Physical Therapy

## 2015-10-04 DIAGNOSIS — Z5189 Encounter for other specified aftercare: Secondary | ICD-10-CM | POA: Diagnosis not present

## 2015-10-04 DIAGNOSIS — R6889 Other general symptoms and signs: Secondary | ICD-10-CM

## 2015-10-04 DIAGNOSIS — Z4789 Encounter for other orthopedic aftercare: Secondary | ICD-10-CM

## 2015-10-04 DIAGNOSIS — R269 Unspecified abnormalities of gait and mobility: Secondary | ICD-10-CM

## 2015-10-04 DIAGNOSIS — R531 Weakness: Secondary | ICD-10-CM | POA: Diagnosis not present

## 2015-10-04 DIAGNOSIS — R2681 Unsteadiness on feet: Secondary | ICD-10-CM

## 2015-10-04 DIAGNOSIS — Z89512 Acquired absence of left leg below knee: Secondary | ICD-10-CM | POA: Diagnosis not present

## 2015-10-04 NOTE — Therapy (Signed)
[facility] [address] [place], [place], [address] Phone: [phone]   Fax:  [phone]  Physical Therapy Treatment  Patient Details  Name: Jackie [name] MRN: [id number] Date of Birth: 01/23/1935 Referring Provider: Aldean BakerMarcus Duda, MD  Encounter Date: 10/04/2015      PT End of Session - 10/04/15 1052    Visit Number 8   Number of Visits 18   Date for PT Re-Evaluation 11/08/15   Authorization Type Medicare G-code & progress report every 10th visit   PT Start Time 1016   PT Stop Time 1100   PT Time Calculation (min) 44 min   Equipment Utilized During Treatment Gait belt   Activity Tolerance Patient tolerated treatment well   Behavior During [facility] for tasks assessed/performed      Past Medical History  Diagnosis Date  . Hypertension   . Diabetes mellitus   . Thyroid disease     pt denies this  . Anemia   . Hyperlipidemia   . Arthritis   . Pneumonia   . End-stage renal disease on hemodialysis [facility](HCC)     dialysis T/Th/Sa    Past Surgical History  Procedure Laterality Date  . Arteriovenous graft placement Left     non function  . Eye surgery Bilateral     catarct  . Cesarean section    . Av fistula placement Right 01/30/2013    Procedure: ARTERIOVENOUS (AV) FISTULA CREATION;  Surgeon: Fransisco HertzBrian L Chen, MD;  [facility];  Service: Vascular;  Laterality: Right;  . Insertion of dialysis catheter Right 04/24/2013    Procedure: INSERTION OF DIALYSIS CATHETER;  Surgeon: Larina Earthlyodd F Early, MD;  [facility];  Service: Vascular;  Laterality: Right;  . Fistula superficialization Right 04/24/2013    Procedure: FISTULA SUPERFICIALIZATION;  Surgeon: Larina Earthlyodd F Early, MD;  [facility];  Service: Vascular;  Laterality: Right;  . Fistulogram Right 07/26/2013    Procedure: FISTULOGRAM;  Surgeon: Larina Earthlyodd F Early, MD;  [facility];  Service: Cardiovascular;  Laterality: Right;  . Peripheral vascular catheterization  N/A 12/05/2014    Procedure: Abdominal Aortogram;  Surgeon: Fransisco HertzBrian L Chen, MD;  [facility];  Service: Cardiovascular;  Laterality: N/A;  . Peripheral vascular catheterization N/A 03/19/2015    Procedure: Lower Extremity Angiography;  Surgeon: Nada LibmanVance W Brabham, MD;  [facility]: [facility] INVASIVE CV LAB;  Service: Cardiovascular;  Laterality: N/A;  . Peripheral vascular catheterization  03/19/2015    Procedure: Peripheral Vascular Intervention;  Surgeon: Nada LibmanVance W Brabham, MD;  [facility];  Service: Cardiovascular;;  left sfa and popliteal stent  . Tonsillectomy    . Amputation Left 04/24/2015    Procedure: LEFT BELOW KNEE AMPUTATION;  Surgeon: Nadara MustardMarcus Duda V, MD;  Location: [facility] OR;  Service: Orthopedics;  Laterality: Left;    There were no vitals filed for this visit.  Visit Diagnosis:  Unsteadiness  Abnormality of gait  Encounter for prosthetic gait training  Decreased functional activity tolerance      Subjective Assessment - 10/04/15 1023    Subjective Patient reports wear 4 hrs 2x/day on dialysis days and ~4 hrs before & ~3  hrs after dialysis days without issues. No falls.    Pertinent History HTN, DM, thyroid disease, HLD, arthritis, ESRD, pneumonia, syncope   Patient Stated Goals She wants to prosthesis to walk in community & home.    Currently in Pain? No/denies  [facility] Adult PT Treatment/Exercise - 10/04/15 1025    Ambulation/Gait   Ambulation/Gait Yes   Ambulation/Gait Assistance 4: Min guard   Ambulation/Gait Assistance Details Verbal cues on sequencing, weight shifting, and increased safety awareness on turns.   Ambulation Distance (Feet) 250 Feet  Also 120x1   Assistive device Prosthesis;Straight cane   Gait Pattern Decreased step length - right;Decreased stance time - left;Decreased stride length;Decreased hip/knee flexion - left;Decreased weight shift to left;Left steppage;Antalgic;Trunk flexed;Abducted - left;Poor foot clearance -  left;Step-through pattern   Ambulation Surface Level;Indoor   Knee/Hip Exercises: Aerobic   Other Aerobic Sci-fit x 4 extremities with goal of 25 RPM for strengthening and endurance.   Prosthetics   Prosthetic Care Comments  Patient arrived with prosthesis properly donned & after skin check redonned correctly. Patient educated on importance of proper wear schedule and drying prosthesis and residual limb regularly.   Current prosthetic wear tolerance (days/week)  wears daily including dialysis days   Current prosthetic wear tolerance (#hours/day)  dialysis days: wear all out of bed hours when not at dialysis and Non-Dialysis days increase to 5 hrs 2x/day   Current prosthetic weight-bearing tolerance (hours/day)  Pt tolerates standing 30 minutes with partial weight on prosthesis with no pain or discomfort   Edema none   Residual limb condition  normal moisture, no open areas, clean skin   Education Provided Residual limb care;Prosthetic cleaning;Correct ply sock adjustment;Proper Donning;Proper wear schedule/adjustment;Proper weight-bearing schedule/adjustment   Person(s) Educated Patient   Education Method Explanation   Education Method Verbalized understanding   Donning Prosthesis Supervision   Doffing Prosthesis Modified independent (device/increased time)          PT Education - 10/04/15 1051    Education provided Yes   Education Details Importance of proper wear schedule and regular drying of prosthesis and residual limb.    Person(s) Educated Patient   Methods Explanation   Comprehension Verbalized understanding;Need further instruction          PT Short Term Goals - 09/25/15 1350    PT SHORT TERM GOAL #1   Title Patient demonstrates proper donning/doffing, verbalizes proper cleaning & weighing /wearing at dialysis. (Target Date: 10/09/2015)   Time 1   Period Months   Status On-going   PT SHORT TERM GOAL #2   Title Patient tolerates prosthesis wear >10 hrs total /day  without skin issues or tenderness. (Target Date: 10/09/2015)   Time 1   Period Months   Status On-going   PT SHORT TERM GOAL #3   Title Patient reaches 7" anteriorly, laterally, across midline and to floor with supervision. (Target Date: 10/09/2015)   Time 1   Period Months   Status On-going   PT SHORT TERM GOAL #4   Title Patient ambulates 200' with prosthesis & RW with supervision. (Target Date: 10/09/2015)   Baseline Achieved 09/20/2015 at 350' with prosthesis & RW with supervision.   Time 1   Period Months   Status Achieved   PT SHORT TERM GOAL #5   Title Patient negotiates ramps, curbs & stairs (2 rails) with RW & prosthesis with supervision. (Target Date: 10/09/2015)   Time 1   Period Months   Status On-going           PT Long Term Goals - 09/09/15 1015    PT LONG TERM GOAL #1   Title Patient is independent with prosthetic care including using prosthesis at dialysis to enable safe utilization of prosthesis. (Target Date: 11/08/2015)   Time 2  Period Months   Status New   PT LONG TERM GOAL #2   Title Patient tolerates wear of prosthesis >90% of awake hours to enable function majority of her day. (Target Date: 11/08/2015)   Time 2   Period Months   Status New   PT LONG TERM GOAL #3   Title Berg Balance >/= 45/56 to indicate lower fall risk. (Target Date: 11/08/2015)   Time 2   Period Months   Status New   PT LONG TERM GOAL #4   Title Pateint ambulates 300' with LRAD & prosthesis including ramps & curbs modified independent to enable community mobility. (Target Date: 11/08/2015)   Time 2   Period Months   Status New   PT LONG TERM GOAL #5   Title Patient ambulates household distances with LRAD & prosthesis modified independent. (Target Date: 11/08/2015)   Time 2   Period Months   Status New          Plan - 10/04/15 1052    Clinical Impression Statement Skilled session addresing prosthetic care, gait training with cane, and endurance training. Patient verbalized  increased comfort with single point cane within session and appeared to adapt to cane quickly. Patient is making steady progress toward goals.   Pt will benefit from skilled therapeutic intervention in order to improve on the following deficits Abnormal gait;Decreased activity tolerance;Decreased balance;Decreased endurance;Decreased knowledge of precautions;Decreased knowledge of use of DME;Decreased mobility;Decreased strength;Postural dysfunction;Prosthetic Dependency   Rehab Potential Good   PT Frequency 2x / week   PT Duration Other (comment)  9 weeks (60 days)   PT Treatment/Interventions ADLs/Self Care Home Management;DME Instruction;Gait training;Stair training;Functional mobility training;Therapeutic activities;Therapeutic exercise;Balance training;Neuromuscular re-education;Patient/family education;Prosthetic Training   PT Next Visit Plan Assess STGs next week, review prosthetic care. Continue gait with cane and consider barriers.   PT Home Exercise Plan Reviewed prosthetic care with patient. Issued handout. Consider gym membership - discussed once with patient already.   Consulted and Agree with Plan of Care Patient        Problem List Patient Active Problem List   Diagnosis Date Noted  . S/P unilateral BKA (below knee amputation) (HCC) 04/24/2015  . 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 Leukocytosis, unspecified 04/22/2013  . HTN (hypertension), benign 04/18/2013  . Diabetes mellitus (HCC) 04/18/2013  . Syncope 04/18/2013  . Anemia 04/18/2013  . End stage renal disease (HCC) 09/02/2011    Otis Dials, SPTA 10/04/2015, 11:01 AM  [facility] [address] [place], [place], [address] Phone: [phone]   Fax:  [phone]  Name: Jackie [name] MRN: [id number] Date of Birth: 07-25-1934  This note has been reviewed and edited by supervising CI.  Sallyanne Kuster, PTA, [facility] [address] [place], [place] [address] [phone] 10/06/2015, 10:48 PM

## 2015-10-05 DIAGNOSIS — N186 End stage renal disease: Secondary | ICD-10-CM | POA: Diagnosis not present

## 2015-10-05 DIAGNOSIS — E1129 Type 2 diabetes mellitus with other diabetic kidney complication: Secondary | ICD-10-CM | POA: Diagnosis not present

## 2015-10-05 DIAGNOSIS — N2581 Secondary hyperparathyroidism of renal origin: Secondary | ICD-10-CM | POA: Diagnosis not present

## 2015-10-05 DIAGNOSIS — D509 Iron deficiency anemia, unspecified: Secondary | ICD-10-CM | POA: Diagnosis not present

## 2015-10-05 DIAGNOSIS — D631 Anemia in chronic kidney disease: Secondary | ICD-10-CM | POA: Diagnosis not present

## 2015-10-08 DIAGNOSIS — E1129 Type 2 diabetes mellitus with other diabetic kidney complication: Secondary | ICD-10-CM | POA: Diagnosis not present

## 2015-10-08 DIAGNOSIS — N186 End stage renal disease: Secondary | ICD-10-CM | POA: Diagnosis not present

## 2015-10-08 DIAGNOSIS — D509 Iron deficiency anemia, unspecified: Secondary | ICD-10-CM | POA: Diagnosis not present

## 2015-10-08 DIAGNOSIS — N2581 Secondary hyperparathyroidism of renal origin: Secondary | ICD-10-CM | POA: Diagnosis not present

## 2015-10-08 DIAGNOSIS — D631 Anemia in chronic kidney disease: Secondary | ICD-10-CM | POA: Diagnosis not present

## 2015-10-09 ENCOUNTER — Encounter: Payer: Self-pay | Admitting: Physical Therapy

## 2015-10-09 ENCOUNTER — Ambulatory Visit: Payer: Medicare Other | Admitting: Physical Therapy

## 2015-10-09 DIAGNOSIS — Z5189 Encounter for other specified aftercare: Secondary | ICD-10-CM | POA: Diagnosis not present

## 2015-10-09 DIAGNOSIS — R531 Weakness: Secondary | ICD-10-CM | POA: Diagnosis not present

## 2015-10-09 DIAGNOSIS — R6889 Other general symptoms and signs: Secondary | ICD-10-CM | POA: Diagnosis not present

## 2015-10-09 DIAGNOSIS — R2681 Unsteadiness on feet: Secondary | ICD-10-CM | POA: Diagnosis not present

## 2015-10-09 DIAGNOSIS — Z4789 Encounter for other orthopedic aftercare: Secondary | ICD-10-CM

## 2015-10-09 DIAGNOSIS — R269 Unspecified abnormalities of gait and mobility: Secondary | ICD-10-CM

## 2015-10-09 DIAGNOSIS — Z89512 Acquired absence of left leg below knee: Secondary | ICD-10-CM | POA: Diagnosis not present

## 2015-10-09 IMAGING — CR DG CHEST 2V
2 series · 2 of 2 positions shown · non-contrast
Comparison: January 16, 2015.

CLINICAL DATA: Gangrene of foot.

EXAM:
CHEST  2 VIEW

[w chest pa]
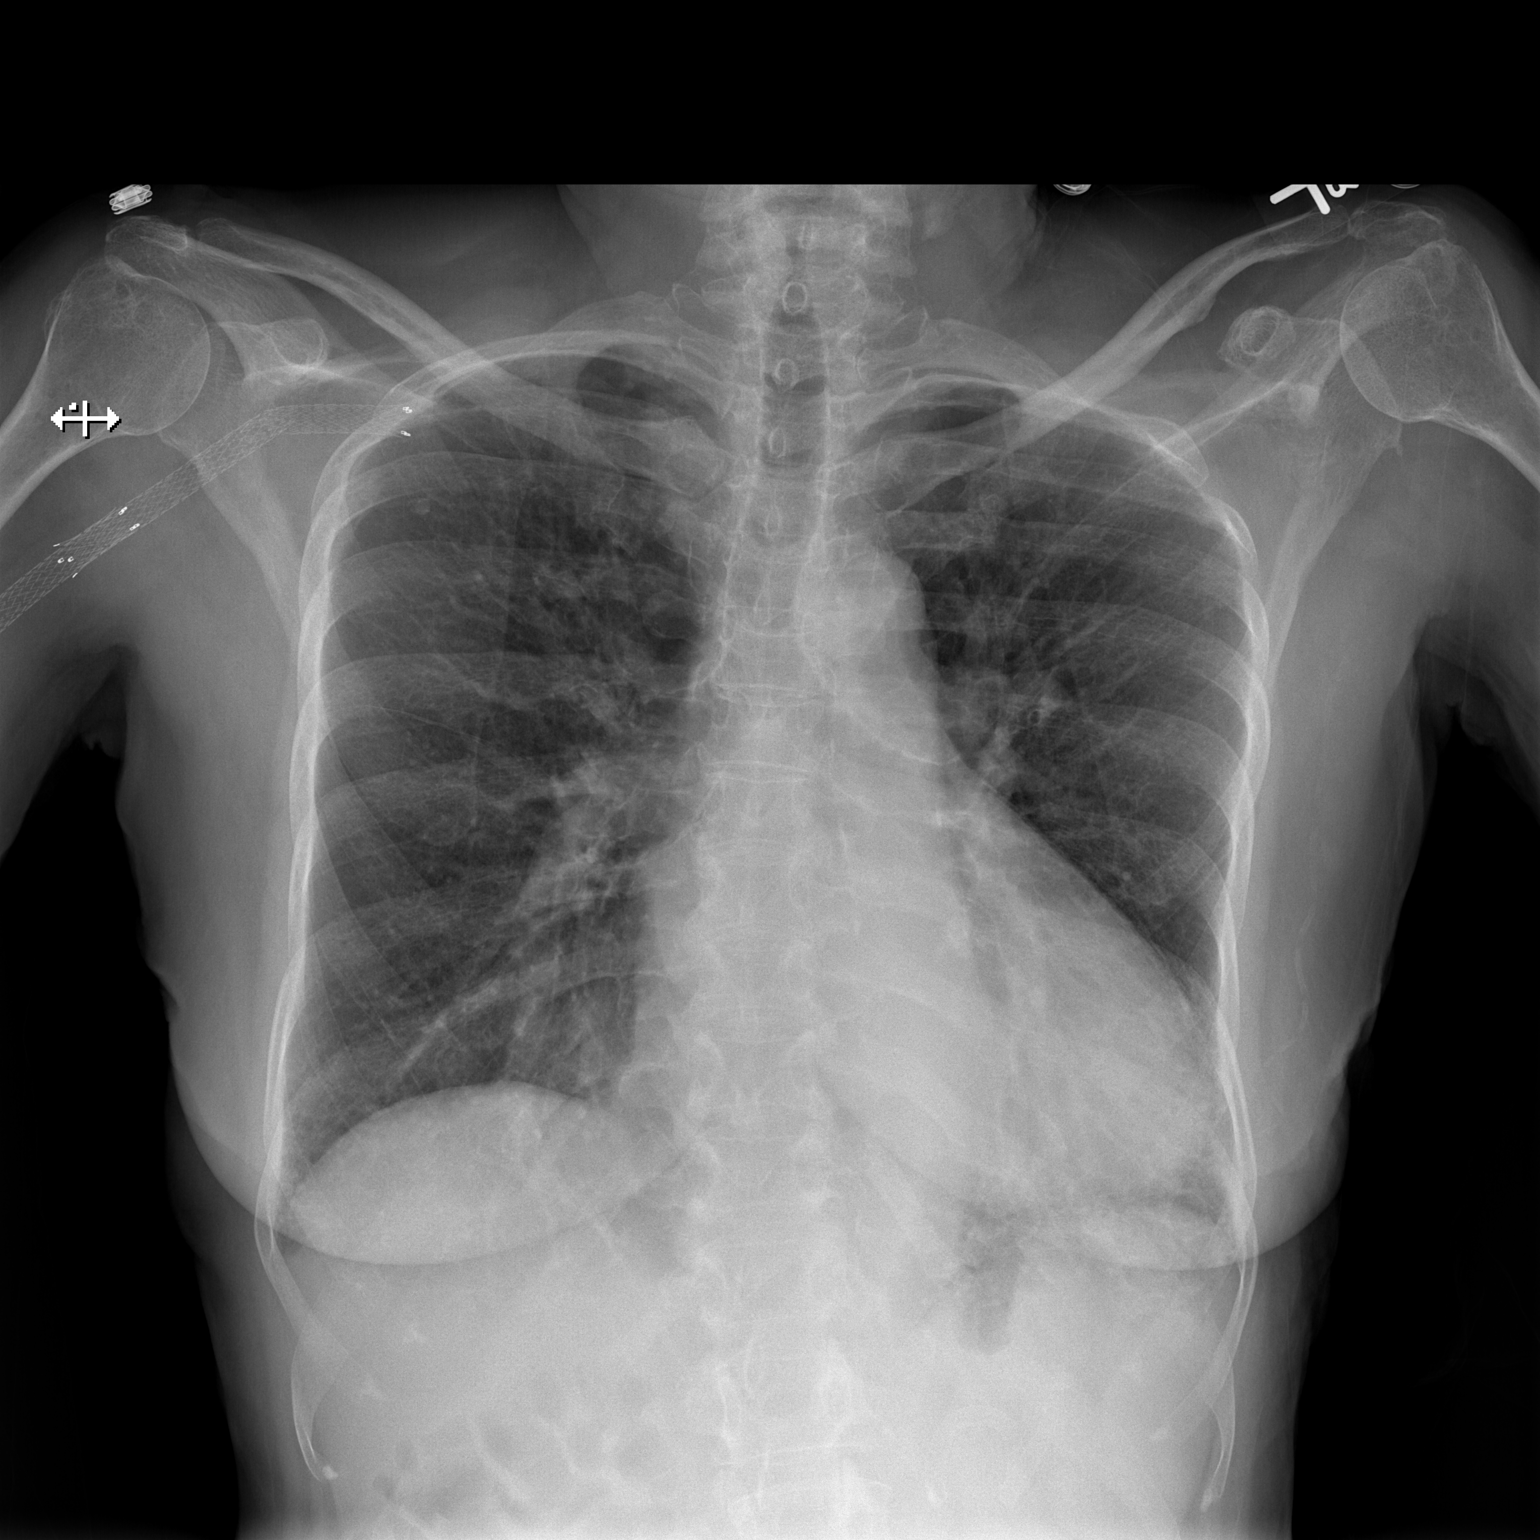

[w chest lat]
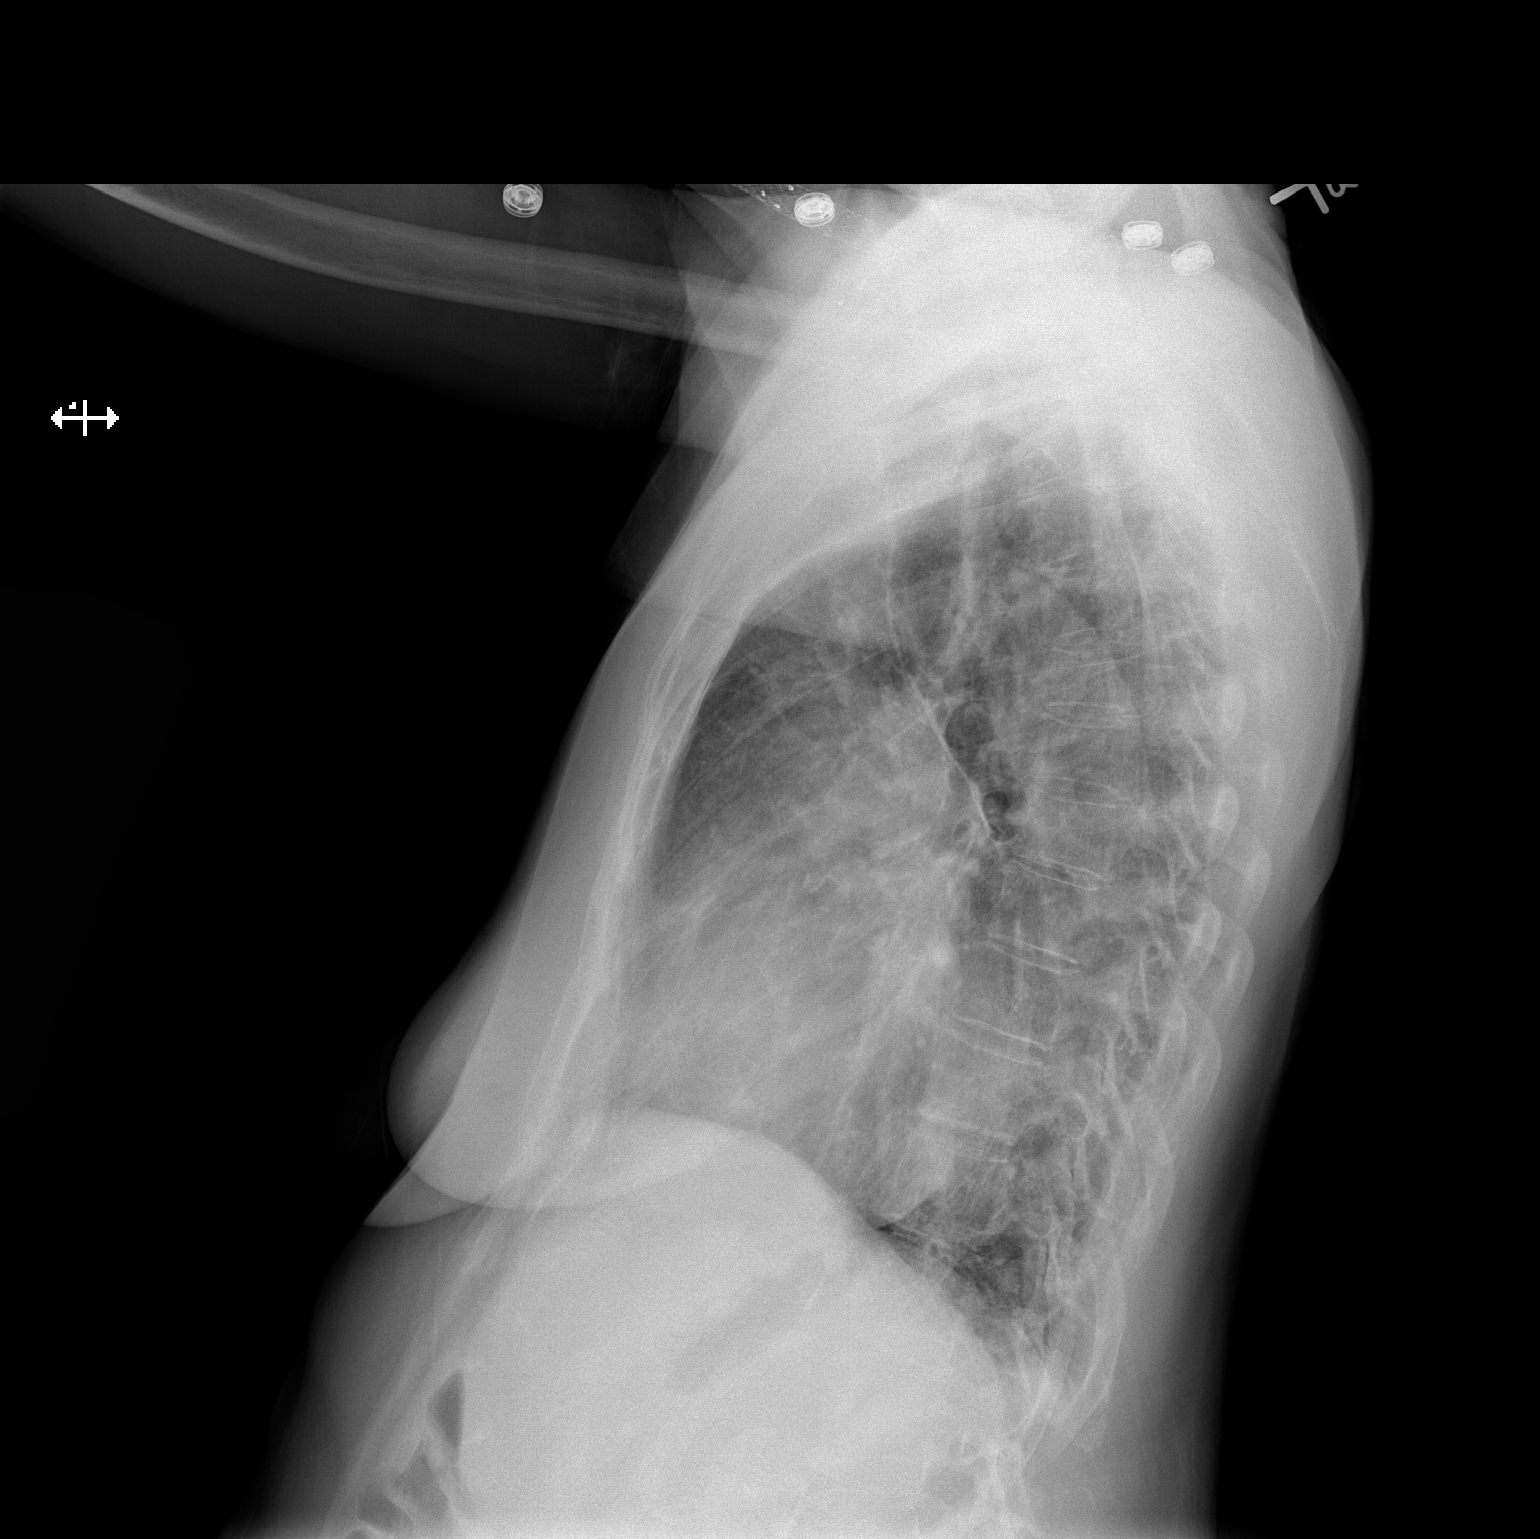

[2 of 2 positions shown; findings below may reference images not displayed]

FINDINGS: Stable cardiomegaly. No pneumothorax or pleural effusion is noted.
No acute pulmonary disease is noted. Vascular stents are noted in
the right axillary region. Bony thorax is unremarkable.
IMPRESSION: No active cardiopulmonary disease.

## 2015-10-09 NOTE — Therapy (Signed)
[facility] [address] [place], [place], [address] Phone: [phone]   Fax:  [phone]  Physical Therapy Treatment  Patient Details  Name: Jackie [name] MRN: [id number] Date of Birth: [birth year] Referring Provider: Meridee Score, MD  Encounter Date: 10/09/2015      PT End of Session - 10/09/15 1350    Visit Number 9   Number of Visits 18   Date for PT Re-Evaluation 11/08/15   Authorization Type Medicare G-code & progress report every 10th visit   PT Start Time 1014   PT Stop Time 1058   PT Time Calculation (min) 44 min   Equipment Utilized During Treatment Gait belt   Activity Tolerance Patient tolerated treatment well   Behavior During Therapy [facility] for tasks assessed/performed      Past Medical History  Diagnosis Date  . Hypertension   . Diabetes mellitus   . Thyroid disease     pt denies this  . Anemia   . Hyperlipidemia   . Arthritis   . Pneumonia   . End-stage renal disease on hemodialysis [facility])     dialysis T/Th/Sa    Past Surgical History  Procedure Laterality Date  . Arteriovenous graft placement Left     non function  . Eye surgery Bilateral     catarct  . Cesarean section    . Av fistula placement Right 01/30/2013    Procedure: ARTERIOVENOUS (AV) FISTULA CREATION;  Surgeon: Conrad Alamo, MD;  Location: [place];  Service: Vascular;  Laterality: Right;  . Insertion of dialysis catheter Right 04/24/2013    Procedure: INSERTION OF DIALYSIS CATHETER;  Surgeon: Rosetta Posner, MD;  Location: [place];  Service: Vascular;  Laterality: Right;  . Fistula superficialization Right 04/24/2013    Procedure: FISTULA SUPERFICIALIZATION;  Surgeon: Rosetta Posner, MD;  Location: [place];  Service: Vascular;  Laterality: Right;  . Fistulogram Right 07/26/2013    Procedure: FISTULOGRAM;  Surgeon: Rosetta Posner, MD;  [facility] LAB;  Service: Cardiovascular;  Laterality: Right;  . Peripheral vascular catheterization  N/A 12/05/2014    Procedure: Abdominal Aortogram;  Surgeon: Conrad Lady Lake, MD;  Location: [place] CV LAB;  Service: Cardiovascular;  Laterality: N/A;  . Peripheral vascular catheterization N/A 03/19/2015    Procedure: Lower Extremity Angiography;  Surgeon: Serafina Mitchell, MD;  Location: [place] CV LAB;  Service: Cardiovascular;  Laterality: N/A;  . Peripheral vascular catheterization  03/19/2015    Procedure: Peripheral Vascular Intervention;  Surgeon: Serafina Mitchell, MD;  Location: [place] CV LAB;  Service: Cardiovascular;;  left sfa and popliteal stent  . Tonsillectomy    . Amputation Left 04/24/2015    Procedure: LEFT BELOW KNEE AMPUTATION;  Surgeon: Newt Minion, MD;  Location: [place];  Service: Orthopedics;  Laterality: Left;    There were no vitals filed for this visit.  Visit Diagnosis:  Unsteadiness  Abnormality of gait  Encounter for prosthetic gait training  Decreased functional activity tolerance      Subjective Assessment - 10/09/15 1019    Subjective Patient reports wear 4 hrs 2x/day on dialysis days and ~4 hrs before & ~3  hrs after dialysis days without issues. No falls.    Pertinent History HTN, DM, thyroid disease, HLD, arthritis, ESRD, pneumonia, syncope   Patient Stated Goals She wants to prosthesis to walk in community & home.    Currently in Pain? No/denies  [place] Adult PT Treatment/Exercise - 10/09/15 1022    Ambulation/Gait   Ambulation/Gait Yes   Ambulation/Gait Assistance 5: Supervision   Ambulation/Gait Assistance Details Verbal cues for weight shifting, increasing step length RLE.   Ambulation Distance (Feet) 210 Feet   Assistive device Prosthesis;Rolling walker   Gait Pattern Decreased step length - right;Decreased stance time - left;Decreased stride length;Decreased hip/knee flexion - left;Decreased weight shift to left;Left steppage;Antalgic;Trunk flexed;Abducted - left;Poor foot clearance - left;Step-through pattern   Ambulation  Surface Level;Indoor   Stairs Yes   Stairs Assistance 5: Supervision   Stairs Assistance Details (indicate cue type and reason) Cues for increased safety awareness.   Stair Management Technique Two rails;Step to pattern;Forwards   Number of Stairs 4   Ramp 5: Supervision   Ramp Details (indicate cue type and reason) Cues on weight shift with RW.   Curb 5: Supervision   Curb Details (indicate cue type and reason) Cues on weight shift with RW.   Gait Comments Gait around tables with single UE support on table with cane and prosthesis to facilitate weight shift onto LLE x 4 laps aorund table.`   Prosthetics   Prosthetic Care Comments  Patient arrived with prosthesis properly donned & after skin check redonned correctly. Patient educated on importance of proper wear schedule and drying prosthesis and residual limb regularly.   Current prosthetic wear tolerance (days/week)  wears daily including dialysis days   Current prosthetic wear tolerance (#hours/day)  dialysis days: wear all out of bed hours when not at dialysis and Non-Dialysis days increase to 5 hrs 2x/day   Current prosthetic weight-bearing tolerance (hours/day)  Pt tolerates standing 30 minutes with partial weight on prosthesis with no pain or discomfort   Edema none   Residual limb condition  normal moisture, no open areas, clean skin   Education Provided Residual limb care;Prosthetic cleaning;Proper Donning;Proper wear schedule/adjustment;Proper weight-bearing schedule/adjustment;Skin check   Person(s) Educated Patient   Education Method Explanation;Handout   Education Method Verbalized understanding;Returned Doctor, general practice Prosthesis Supervision   Doffing Prosthesis Modified independent (device/increased time)          PT Short Term Goals - 10/09/15 1354    PT SHORT TERM GOAL #1   Title Patient demonstrates proper donning/doffing, verbalizes proper cleaning & weighing /wearing at dialysis. (Target Date: 10/09/2015)    Baseline 10/09/15 Patient demonstrates proper doffing, verbalizes proper cleaning and weighting/wearing at dialysis. Patient continues to struggle with proper alignment of liner.    Time 1   Period Months   Status On-going   PT SHORT TERM GOAL #2   Title Patient tolerates prosthesis wear >10 hrs total /day without skin issues or tenderness. (Target Date: 10/09/2015)   Baseline 10/09/15 Patient tolerates 8 hrs total/day without skin issues or tenderness with wear schedule set for 10 hrs/day.   Time 1   Period Months   Status On-going   PT SHORT TERM GOAL #3   Title Patient reaches 7" anteriorly, laterally, across midline and to floor with supervision. (Target Date: 10/09/2015)   Baseline Patient reaches at least 7" anteriorly, laterally, and across midline. Patient able to reach to floor with supervision   Time 1   Period Months   Status Achieved   PT SHORT TERM GOAL #4   Title Patient ambulates 200' with prosthesis & RW with supervision. (Target Date: 10/09/2015)   Baseline Achieved 09/20/2015 at 350' with prosthesis & RW with supervision.   Time 1   Period Months   Status Achieved  PT SHORT TERM GOAL #5   Title Patient negotiates ramps, curbs & stairs (2 rails) with RW & prosthesis with supervision. (Target Date: 10/09/2015)   Baseline Goal MET 10/09/15   Time 1   Period Months   Status Achieved          PT Long Term Goals - 09/09/15 1015    PT LONG TERM GOAL #1   Title Patient is independent with prosthetic care including using prosthesis at dialysis to enable safe utilization of prosthesis. (Target Date: 11/08/2015)   Time 2   Period Months   Status New   PT LONG TERM GOAL #2   Title Patient tolerates wear of prosthesis >90% of awake hours to enable function majority of her day. (Target Date: 11/08/2015)   Time 2   Period Months   Status New   PT LONG TERM GOAL #3   Title Berg Balance >/= 45/56 to indicate lower fall risk. (Target Date: 11/08/2015)   Time 2   Period Months    Status New   PT LONG TERM GOAL #4   Title Pateint ambulates 300' with LRAD & prosthesis including ramps & curbs modified independent to enable community mobility. (Target Date: 11/08/2015)   Time 2   Period Months   Status New   PT LONG TERM GOAL #5   Title Patient ambulates household distances with LRAD & prosthesis modified independent. (Target Date: 11/08/2015)   Time 2   Period Months   Status New          Plan - 10/09/15 1351    Clinical Impression Statement Skilled session addressing STGs and deficits in gait with single point cane. Patient demo increased safety with RW on indoor surfaces including barriers as well as on stairs. Patient was able to meet some STGs today with patient making progress toward others and LTGs.   Pt will benefit from skilled therapeutic intervention in order to improve on the following deficits Abnormal gait;Decreased activity tolerance;Decreased balance;Decreased endurance;Decreased knowledge of precautions;Decreased knowledge of use of DME;Decreased mobility;Decreased strength;Postural dysfunction;Prosthetic Dependency   Rehab Potential Good   PT Frequency 2x / week   PT Duration Other (comment)  9 weeks (60 days)   PT Treatment/Interventions ADLs/Self Care Home Management;DME Instruction;Gait training;Stair training;Functional mobility training;Therapeutic activities;Therapeutic exercise;Balance training;Neuromuscular re-education;Patient/family education;Prosthetic Training   PT Next Visit Plan Review prosthetic care; patient continues to struggle with proper alignment of liner. Continue gait with cane and consider barriers. G CODE DUE NEXT VISIT.    PT Home Exercise Plan Consider gym membership - discussed once with patient already.   Consulted and Agree with Plan of Care Patient      Problem List Patient Active Problem List   Diagnosis Date Noted  . S/P unilateral BKA (below knee amputation) ([place]) 04/24/2015  . Foot ulcer with necrosis of muscle  ([place]) 12/04/2014  . Leukocytosis, unspecified 04/22/2013  . HTN (hypertension), benign 04/18/2013  . Diabetes mellitus ([place]) 04/18/2013  . Syncope 04/18/2013  . Anemia 04/18/2013  . End stage renal disease ([place]) 09/02/2011    Bayard Beaver, SPTA 10/09/2015, 1:58 PM  [place] [facility] [address] [place], [place], [address] Phone: [phone]   Fax:  [phone]  Name: IVALEE STRAUSER MRN: [id number] Date of Birth: 1935/04/04  This note has been reviewed and edited by supervising CI.  Willow Ora, PTA, [place] [address] [place], [place] [address] [phone] 10/09/2015, 10:57 PM

## 2015-10-10 DIAGNOSIS — N2581 Secondary hyperparathyroidism of renal origin: Secondary | ICD-10-CM | POA: Diagnosis not present

## 2015-10-10 DIAGNOSIS — E1129 Type 2 diabetes mellitus with other diabetic kidney complication: Secondary | ICD-10-CM | POA: Diagnosis not present

## 2015-10-10 DIAGNOSIS — N186 End stage renal disease: Secondary | ICD-10-CM | POA: Diagnosis not present

## 2015-10-10 DIAGNOSIS — D631 Anemia in chronic kidney disease: Secondary | ICD-10-CM | POA: Diagnosis not present

## 2015-10-10 DIAGNOSIS — D509 Iron deficiency anemia, unspecified: Secondary | ICD-10-CM | POA: Diagnosis not present

## 2015-10-11 ENCOUNTER — Ambulatory Visit: Payer: Medicare Other | Admitting: Physical Therapy

## 2015-10-11 ENCOUNTER — Encounter: Payer: Self-pay | Admitting: Physical Therapy

## 2015-10-11 DIAGNOSIS — Z5189 Encounter for other specified aftercare: Secondary | ICD-10-CM | POA: Diagnosis not present

## 2015-10-11 DIAGNOSIS — Z4789 Encounter for other orthopedic aftercare: Secondary | ICD-10-CM

## 2015-10-11 DIAGNOSIS — R531 Weakness: Secondary | ICD-10-CM

## 2015-10-11 DIAGNOSIS — R6889 Other general symptoms and signs: Secondary | ICD-10-CM | POA: Diagnosis not present

## 2015-10-11 DIAGNOSIS — Z89512 Acquired absence of left leg below knee: Secondary | ICD-10-CM | POA: Diagnosis not present

## 2015-10-11 DIAGNOSIS — R269 Unspecified abnormalities of gait and mobility: Secondary | ICD-10-CM | POA: Diagnosis not present

## 2015-10-11 DIAGNOSIS — R2681 Unsteadiness on feet: Secondary | ICD-10-CM

## 2015-10-11 NOTE — Therapy (Addendum)
[facility] [address] [place], [place], [address] Phone: [phone]   Fax:  [phone]  Physical Therapy Treatment  Patient Details  Name: SENOVIA GAUER MRN: [id number] Date of Birth: 1935-03-07 Referring Provider: Meridee Score, MD  Encounter Date: 10/11/2015      PT End of Session - 10/11/15 1104    Visit Number 10   Number of Visits 18   Date for PT Re-Evaluation 11/08/15   Authorization Type Medicare G-code & progress report every 10th visit   PT Start Time 1016   PT Stop Time 1100   PT Time Calculation (min) 44 min   Equipment Utilized During Treatment Gait belt   Activity Tolerance Patient tolerated treatment well   Behavior During Therapy [facility] for tasks assessed/performed      Past Medical History  Diagnosis Date  . Hypertension   . Diabetes mellitus   . Thyroid disease     pt denies this  . Anemia   . Hyperlipidemia   . Arthritis   . Pneumonia   . End-stage renal disease on hemodialysis [facility])     dialysis T/Th/Sa    Past Surgical History  Procedure Laterality Date  . Arteriovenous graft placement Left     non function  . Eye surgery Bilateral     catarct  . Cesarean section    . Av fistula placement Right 01/30/2013    Procedure: ARTERIOVENOUS (AV) FISTULA CREATION;  Surgeon: Conrad [place] Davis, MD;  Location: [place];  Service: Vascular;  Laterality: Right;  . Insertion of dialysis catheter Right 04/24/2013    Procedure: INSERTION OF DIALYSIS CATHETER;  Surgeon: Rosetta Posner, MD;  Location: [place];  Service: Vascular;  Laterality: Right;  . Fistula superficialization Right 04/24/2013    Procedure: FISTULA SUPERFICIALIZATION;  Surgeon: Rosetta Posner, MD;  Location: [place];  Service: Vascular;  Laterality: Right;  . Fistulogram Right 07/26/2013    Procedure: FISTULOGRAM;  Surgeon: Rosetta Posner, MD;  Location: [facility] CATH LAB;  Service: Cardiovascular;  Laterality: Right;  . Peripheral vascular  catheterization N/A 12/05/2014    Procedure: Abdominal Aortogram;  Surgeon: Conrad Raymond, MD;  Location: [place] CV LAB;  Service: Cardiovascular;  Laterality: N/A;  . Peripheral vascular catheterization N/A 03/19/2015    Procedure: Lower Extremity Angiography;  Surgeon: Serafina Mitchell, MD;  Location: [place] CV LAB;  Service: Cardiovascular;  Laterality: N/A;  . Peripheral vascular catheterization  03/19/2015    Procedure: Peripheral Vascular Intervention;  Surgeon: Serafina Mitchell, MD;  Location: [place] CV LAB;  Service: Cardiovascular;;  left sfa and popliteal stent  . Tonsillectomy    . Amputation Left 04/24/2015    Procedure: LEFT BELOW KNEE AMPUTATION;  Surgeon: Newt Minion, MD;  Location: [place];  Service: Orthopedics;  Laterality: Left;    There were no vitals filed for this visit.  Visit Diagnosis:  Unsteadiness  Abnormality of gait  Encounter for prosthetic gait training  Decreased functional activity tolerance  Status post below knee amputation of left lower extremity (HCC)  Weakness generalized      Subjective Assessment - 10/11/15 1021    Subjective Patient reports wear 5 hrs 2x/day on dialysis days and all waking hours dialysis days without issues. No falls.    Pertinent History HTN, DM, thyroid disease, HLD, arthritis, ESRD, pneumonia, syncope   Patient Stated Goals She wants to prosthesis to walk in community & home.    Currently in Pain? No/denies  [facility] Adult PT Treatment/Exercise - 10/11/15 1024    Ambulation/Gait   Ambulation/Gait Yes   Ambulation/Gait Assistance 5: Supervision   Ambulation/Gait Assistance Details Pt demo good sequencing and BOS but still has decreased weight shift in stance on prosthesis even with verbal cues.   Ambulation Distance (Feet) 370 Feet  x1, 345x1   Assistive device Straight cane;Prosthesis   Gait Pattern Decreased step length - right;Decreased stance time - left;Decreased stride length;Decreased hip/knee  flexion - left;Decreased weight shift to left;Left steppage;Antalgic;Trunk flexed;Abducted - left;Poor foot clearance - left;Step-through pattern   Ambulation Surface Level;Indoor   Stairs Yes   Stairs Assistance 5: Supervision   Stairs Assistance Details (indicate cue type and reason) Cues for sequencing, technique, and increased safety awareness.   Stair Management Technique One rail Right;One rail Left;Step to pattern;With cane;Forwards   Number of Stairs 4  x3 up/down   Ramp 4: Min assist   Ramp Details (indicate cue type and reason) Cues on sequencing and technique.   Curb 4: Min assist;3: Mod assist   Curb Details (indicate cue type and reason) Cues on sequencing and technique.          PT Education - 10/11/15 1103    Education provided Yes   Education Details Discussed gym membership and benefits. Patient states she will look into it.   Person(s) Educated Patient   Methods Explanation   Comprehension Verbalized understanding;Need further instruction          PT Short Term Goals - 10/09/15 1354    PT SHORT TERM GOAL #1   Title Patient demonstrates proper donning/doffing, verbalizes proper cleaning & weighing /wearing at dialysis. (Target Date: 10/09/2015)   Baseline 10/09/15 Patient demonstrates proper doffing, verbalizes proper cleaning and weighting/wearing at dialysis. Patient continues to struggle with proper alignment of liner.    Time 1   Period Months   Status On-going   PT SHORT TERM GOAL #2   Title Patient tolerates prosthesis wear >10 hrs total /day without skin issues or tenderness. (Target Date: 10/09/2015)   Baseline 10/09/15 Patient tolerates 8 hrs total/day without skin issues or tenderness with wear schedule set for 10 hrs/day.   Time 1   Period Months   Status On-going   PT SHORT TERM GOAL #3   Title Patient reaches 7" anteriorly, laterally, across midline and to floor with supervision. (Target Date: 10/09/2015)   Baseline Patient reaches at least 7"  anteriorly, laterally, and across midline. Patient able to reach to floor with supervision   Time 1   Period Months   Status Achieved   PT SHORT TERM GOAL #4   Title Patient ambulates 200' with prosthesis & RW with supervision. (Target Date: 10/09/2015)   Baseline Achieved 09/20/2015 at 350' with prosthesis & RW with supervision.   Time 1   Period Months   Status Achieved   PT SHORT TERM GOAL #5   Title Patient negotiates ramps, curbs & stairs (2 rails) with RW & prosthesis with supervision. (Target Date: 10/09/2015)   Baseline Goal MET 10/09/15   Time 1   Period Months   Status Achieved          PT Long Term Goals - 09/09/15 1015    PT LONG TERM GOAL #1   Title Patient is independent with prosthetic care including using prosthesis at dialysis to enable safe utilization of prosthesis. (Target Date: 11/08/2015)   Time 2   Period Months   Status New   PT LONG TERM GOAL #2  Title Patient tolerates wear of prosthesis >90% of awake hours to enable function majority of her day. (Target Date: 11/08/2015)   Time 2   Period Months   Status New   PT LONG TERM GOAL #3   Title Berg Balance >/= 45/56 to indicate lower fall risk. (Target Date: 11/08/2015)   Time 2   Period Months   Status New   PT LONG TERM GOAL #4   Title Pateint ambulates 300' with LRAD & prosthesis including ramps & curbs modified independent to enable community mobility. (Target Date: 11/08/2015)   Time 2   Period Months   Status New   PT LONG TERM GOAL #5   Title Patient ambulates household distances with LRAD & prosthesis modified independent. (Target Date: 11/08/2015)   Time 2   Period Months   Status New          Plan - 11-03-15 1105    Clinical Impression Statement Skilled session addressing prosthetic care and deficits in gait with cane including barriers. Patient tolerates weight shift to prosthetic leg with cane better than previous session but continues to struggle to fully shift weight. Patient successful  with cues on barriers. Patient is making continuous progress toward goals.   Pt will benefit from skilled therapeutic intervention in order to improve on the following deficits Abnormal gait;Decreased activity tolerance;Decreased balance;Decreased endurance;Decreased knowledge of precautions;Decreased knowledge of use of DME;Decreased mobility;Decreased strength;Postural dysfunction;Prosthetic Dependency   Rehab Potential Good   PT Frequency 2x / week   PT Duration Other (comment)  9 weeks (60 days)   PT Treatment/Interventions ADLs/Self Care Home Management;DME Instruction;Gait training;Stair training;Functional mobility training;Therapeutic activities;Therapeutic exercise;Balance training;Neuromuscular re-education;Patient/family education;Prosthetic Training   PT Next Visit Plan Review prosthetic care; patient continues to struggle with proper alignment of liner though better than previous session. Continue gait with cane including barriers.   PT Home Exercise Plan Consider gym membership - discussed once with patient already.   Consulted and Agree with Plan of Care Patient          G-Codes - 11-03-15 1205    Functional Assessment Tool Used Patient is successful with supervision in prosthetic care; patient occasionally struggles to keep liner straight but with verbal cues can correct. Patient wear tolerance currently 5 hours, twice per day.      Problem List Patient Active Problem List   Diagnosis Date Noted  . S/P unilateral BKA (below knee amputation) ([place]) 04/24/2015  . Foot ulcer with necrosis of muscle ([place]) 12/04/2014  . Leukocytosis, unspecified 04/22/2013  . HTN (hypertension), benign 04/18/2013  . Diabetes mellitus ([place]) 04/18/2013  . Syncope 04/18/2013  . Anemia 04/18/2013  . End stage renal disease [facility]) 09/02/2011    Bayard Beaver, SPTA 11/03/2015, 12:08 PM  [facility] [address] [place], [place],  [address] Phone: [phone]   Fax:  [phone]  Name: CRESSIE BETZLER MRN: [id number] Date of Birth: April 21, 1935  This note has been reviewed and edited by supervising CI.  Willow Ora, PTA, [facility] [address] [place],  [address] [phone] 10/13/2015, 10:47 PM      Physical Therapy Progress Note  Dates of Reporting Period: 09/09/2015 to 11/03/15  Objective Reports of Subjective Statement: She reports wearing prosthesis more which improves her balance and mobility  Objective Measurements: see above  Goal Update: see above  Plan: continue established plan of care.   Reason Skilled Services are Required: Patient requires skilled instructions to progress safe  use & function of prosthesis.   Jamey Reas, PT, DPT PT Specializing in [place] 10/14/2015 12:46 PM Phone:  [phone]  Fax:  [phone] [facility] [address] [place], [place] [address]

## 2015-10-12 DIAGNOSIS — N2581 Secondary hyperparathyroidism of renal origin: Secondary | ICD-10-CM | POA: Diagnosis not present

## 2015-10-12 DIAGNOSIS — E1129 Type 2 diabetes mellitus with other diabetic kidney complication: Secondary | ICD-10-CM | POA: Diagnosis not present

## 2015-10-12 DIAGNOSIS — N186 End stage renal disease: Secondary | ICD-10-CM | POA: Diagnosis not present

## 2015-10-12 DIAGNOSIS — D509 Iron deficiency anemia, unspecified: Secondary | ICD-10-CM | POA: Diagnosis not present

## 2015-10-12 DIAGNOSIS — D631 Anemia in chronic kidney disease: Secondary | ICD-10-CM | POA: Diagnosis not present

## 2015-10-15 DIAGNOSIS — D509 Iron deficiency anemia, unspecified: Secondary | ICD-10-CM | POA: Diagnosis not present

## 2015-10-15 DIAGNOSIS — N186 End stage renal disease: Secondary | ICD-10-CM | POA: Diagnosis not present

## 2015-10-15 DIAGNOSIS — D631 Anemia in chronic kidney disease: Secondary | ICD-10-CM | POA: Diagnosis not present

## 2015-10-15 DIAGNOSIS — E1129 Type 2 diabetes mellitus with other diabetic kidney complication: Secondary | ICD-10-CM | POA: Diagnosis not present

## 2015-10-15 DIAGNOSIS — N2581 Secondary hyperparathyroidism of renal origin: Secondary | ICD-10-CM | POA: Diagnosis not present

## 2015-10-16 ENCOUNTER — Ambulatory Visit: Payer: Medicare Other | Admitting: Physical Therapy

## 2015-10-16 ENCOUNTER — Encounter: Payer: Self-pay | Admitting: Physical Therapy

## 2015-10-16 DIAGNOSIS — R531 Weakness: Secondary | ICD-10-CM | POA: Diagnosis not present

## 2015-10-16 DIAGNOSIS — R269 Unspecified abnormalities of gait and mobility: Secondary | ICD-10-CM

## 2015-10-16 DIAGNOSIS — R6889 Other general symptoms and signs: Secondary | ICD-10-CM | POA: Diagnosis not present

## 2015-10-16 DIAGNOSIS — R2689 Other abnormalities of gait and mobility: Secondary | ICD-10-CM

## 2015-10-16 DIAGNOSIS — R2681 Unsteadiness on feet: Secondary | ICD-10-CM

## 2015-10-16 DIAGNOSIS — Z4789 Encounter for other orthopedic aftercare: Secondary | ICD-10-CM

## 2015-10-16 DIAGNOSIS — Z89512 Acquired absence of left leg below knee: Secondary | ICD-10-CM | POA: Diagnosis not present

## 2015-10-16 DIAGNOSIS — Z5189 Encounter for other specified aftercare: Secondary | ICD-10-CM | POA: Diagnosis not present

## 2015-10-17 DIAGNOSIS — D631 Anemia in chronic kidney disease: Secondary | ICD-10-CM | POA: Diagnosis not present

## 2015-10-17 DIAGNOSIS — N2581 Secondary hyperparathyroidism of renal origin: Secondary | ICD-10-CM | POA: Diagnosis not present

## 2015-10-17 DIAGNOSIS — D509 Iron deficiency anemia, unspecified: Secondary | ICD-10-CM | POA: Diagnosis not present

## 2015-10-17 DIAGNOSIS — N186 End stage renal disease: Secondary | ICD-10-CM | POA: Diagnosis not present

## 2015-10-17 DIAGNOSIS — E1129 Type 2 diabetes mellitus with other diabetic kidney complication: Secondary | ICD-10-CM | POA: Diagnosis not present

## 2015-10-17 NOTE — Therapy (Signed)
[facility] [address] [place], [place], [address] Phone: [phone]   Fax:  [phone]  Physical Therapy Treatment  Patient Details  Name: Jackie [name] MRN: [id number] Date of Birth: 10-28-1934 Referring Provider: Meridee Score, MD  Encounter Date: 10/16/2015      PT End of Session - 10/16/15 1058    Visit Number 10   Number of Visits 18   Date for PT Re-Evaluation 11/08/15   Authorization Type Medicare G-code & progress report every 10th visit   PT Start Time 1016   PT Stop Time 1100   PT Time Calculation (min) 44 min   Equipment Utilized During Treatment Gait belt   Activity Tolerance Patient tolerated treatment well   Behavior During Therapy [facility] for tasks assessed/performed      Past Medical History  Diagnosis Date  . Hypertension   . Diabetes mellitus   . Thyroid disease     pt denies this  . Anemia   . Hyperlipidemia   . Arthritis   . Pneumonia   . End-stage renal disease on hemodialysis [facility])     dialysis T/Th/Sa    Past Surgical History  Procedure Laterality Date  . Arteriovenous graft placement Left     non function  . Eye surgery Bilateral     catarct  . Cesarean section    . Av fistula placement Right 01/30/2013    Procedure: ARTERIOVENOUS (AV) FISTULA CREATION;  Surgeon: Conrad Lincolnia, MD;  Location: [place];  Service: Vascular;  Laterality: Right;  . Insertion of dialysis catheter Right 04/24/2013    Procedure: INSERTION OF DIALYSIS CATHETER;  Surgeon: Rosetta Posner, MD;  Location: [place];  Service: Vascular;  Laterality: Right;  . Fistula superficialization Right 04/24/2013    Procedure: FISTULA SUPERFICIALIZATION;  Surgeon: Rosetta Posner, MD;  Location: [place];  Service: Vascular;  Laterality: Right;  . Fistulogram Right 07/26/2013    Procedure: FISTULOGRAM;  Surgeon: Rosetta Posner, MD;  Location: [facility] CATH LAB;  Service: Cardiovascular;  Laterality: Right;  . Peripheral vascular  catheterization N/A 12/05/2014    Procedure: Abdominal Aortogram;  Surgeon: Conrad Kalida, MD;  Location: [place] CV LAB;  Service: Cardiovascular;  Laterality: N/A;  . Peripheral vascular catheterization N/A 03/19/2015    Procedure: Lower Extremity Angiography;  Surgeon: Serafina Mitchell, MD;  Location: [place] CV LAB;  Service: Cardiovascular;  Laterality: N/A;  . Peripheral vascular catheterization  03/19/2015    Procedure: Peripheral Vascular Intervention;  Surgeon: Serafina Mitchell, MD;  Location: [place] CV LAB;  Service: Cardiovascular;;  left sfa and popliteal stent  . Tonsillectomy    . Amputation Left 04/24/2015    Procedure: LEFT BELOW KNEE AMPUTATION;  Surgeon: Newt Minion, MD;  Location: [place];  Service: Orthopedics;  Laterality: Left;    There were no vitals filed for this visit.  Visit Diagnosis:  Unsteadiness  Abnormality of gait  Encounter for prosthetic gait training  Decreased functional activity tolerance  Status post below knee amputation of left lower extremity (HCC)  Weakness generalized  Balance problems      Subjective Assessment - 10/16/15 1015    Subjective Reports she does not wear prosthesis to dialysis yet and flucutates wear on dialysis days from 1 hr to ~50% of awake hours after dialysis.    Patient is accompained by: Family member   Pertinent History HTN, DM, thyroid disease, HLD, arthritis, ESRD, pneumonia, syncope   Patient Stated Goals  She wants to prosthesis to walk in community & home.    Currently in Pain? No/denies                         [facility] Adult PT Treatment/Exercise - 10/16/15 1015    Ambulation/Gait   Ambulation/Gait Yes   Ambulation/Gait Assistance 5: Supervision   Ambulation/Gait Assistance Details Worked on carrying cup of water and then plate with objects while ambulating around furniture with single point cane. PT cued patient on posture, step width, step length & fluency.    Ambulation Distance  (Feet) 400 Feet  400' & 300'   Assistive device Straight cane;Prosthesis   Gait Pattern Decreased step length - right;Decreased stance time - left;Decreased stride length;Decreased hip/knee flexion - left;Decreased weight shift to left;Left steppage;Antalgic;Trunk flexed;Abducted - left;Poor foot clearance - left;Step-through pattern   Ambulation Surface Indoor;Level   Stairs Yes   Stairs Assistance 5: Supervision   Stairs Assistance Details (indicate cue type and reason) cues on technique with 1 rail & cane with various locations for single rail use.    Stair Management Technique One rail Right;One rail Left;Step to pattern;With cane;Forwards   Number of Stairs 4  x3    Ramp 4: Min assist  cane & prosthesis   Ramp Details (indicate cue type and reason) cues on technique including weight shift & posture   Curb 4: Min assist;3: Mod assist  cane & prosthesis   Curb Details (indicate cue type and reason) cues on technique including step length, momentum, and posture   Prosthetics   Prosthetic Care Comments  Weighing & wearing prosthesis with dialysis. Increase wear to all awake hours except 1-2 hours mid-day. Drying half way in morning & evening wears. Signs of sweating with need to dry limb & liner.   use of anti-perspirant on limb   Current prosthetic wear tolerance (days/week)  daily   Current prosthetic wear tolerance (#hours/day)  wearing non-dialysis days 4-5 hrs 2x/day; see subjective for dialysis days. PT instructed to wear daily for all awake hours excetpt 1-2 hrs mid-day.    Residual limb condition  normal moisture, no open areas, clean skin   Education Provided Proper wear schedule/adjustment;Correct ply sock adjustment  see prosthetic care   Person(s) Educated Patient   Education Method Explanation;Demonstration;Tactile cues;Verbal cues   Education Method Verbalized understanding;Returned demonstration;Tactile cues required;Verbal cues required;Needs further instruction              Balance Exercises - 10/16/15 1053    Balance Exercises: Standing   Rockerboard Lateral;EO;10 seconds;Intermittent UE support;Other (comment)  then weight shifts with BUE support   Step Ups 4 inch;Forward;Lateral;UE support 2  lead with RLE 10x & LLE 10x             PT Short Term Goals - 10/09/15 1354    PT SHORT TERM GOAL #1   Title Patient demonstrates proper donning/doffing, verbalizes proper cleaning & weighing /wearing at dialysis. (Target Date: 10/09/2015)   Baseline 10/09/15 Patient demonstrates proper doffing, verbalizes proper cleaning and weighting/wearing at dialysis. Patient continues to struggle with proper alignment of liner.    Time 1   Period Months   Status On-going   PT SHORT TERM GOAL #2   Title Patient tolerates prosthesis wear >10 hrs total /day without skin issues or tenderness. (Target Date: 10/09/2015)   Baseline 10/09/15 Patient tolerates 8 hrs total/day without skin issues or tenderness with wear schedule set for 10 hrs/day.   Time 1     Period Months   Status On-going   PT SHORT TERM GOAL #3   Title Patient reaches 7" anteriorly, laterally, across midline and to floor with supervision. (Target Date: 10/09/2015)   Baseline Patient reaches at least 7" anteriorly, laterally, and across midline. Patient able to reach to floor with supervision   Time 1   Period Months   Status Achieved   PT SHORT TERM GOAL #4   Title Patient ambulates 200' with prosthesis & RW with supervision. (Target Date: 10/09/2015)   Baseline Achieved 09/20/2015 at 350' with prosthesis & RW with supervision.   Time 1   Period Months   Status Achieved   PT SHORT TERM GOAL #5   Title Patient negotiates ramps, curbs & stairs (2 rails) with RW & prosthesis with supervision. (Target Date: 10/09/2015)   Baseline Goal MET 10/09/15   Time 1   Period Months   Status Achieved           PT Long Term Goals - 10/16/15 1100    PT LONG TERM GOAL #1   Title Patient is independent  with prosthetic care including using prosthesis at dialysis to enable safe utilization of prosthesis. (Target Date: 11/08/2015)   Time 2   Period Months   Status On-going   PT LONG TERM GOAL #2   Title Patient tolerates wear of prosthesis >90% of awake hours to enable function majority of her day. (Target Date: 11/08/2015)   Time 2   Period Months   Status On-going   PT LONG TERM GOAL #3   Title Berg Balance >/= 45/56 to indicate lower fall risk. (Target Date: 11/08/2015)   Time 2   Period Months   Status On-going   PT LONG TERM GOAL #4   Title Pateint ambulates 300' with LRAD & prosthesis including ramps & curbs modified independent to enable community mobility. (Target Date: 11/08/2015)   Time 2   Period Months   Status On-going   PT LONG TERM GOAL #5   Title Patient ambulates household distances with LRAD & prosthesis modified independent. (Target Date: 11/08/2015)   Time 2   Period Months   Status On-going               Plan - 10/16/15 1100    Clinical Impression Statement Pateint verbalizes general understanding of weighing, taking prosthesis weight out of weigh-in / weigh-out at dialysis and positioning while on dialysis. Patient reports feels she can carry things in home using cane with instruction from PT.    Pt will benefit from skilled therapeutic intervention in order to improve on the following deficits Abnormal gait;Decreased activity tolerance;Decreased balance;Decreased endurance;Decreased knowledge of precautions;Decreased knowledge of use of DME;Decreased mobility;Decreased strength;Postural dysfunction;Prosthetic Dependency   Rehab Potential Good   PT Frequency 2x / week   PT Duration Other (comment)  9 weeks (60 days)   PT Treatment/Interventions ADLs/Self Care Home Management;DME Instruction;Gait training;Stair training;Functional mobility training;Therapeutic activities;Therapeutic exercise;Balance training;Neuromuscular re-education;Patient/family  education;Prosthetic Training   PT Next Visit Plan Continue with gait & balance towards LTGs, check if she determined weight of prosthesis   PT Home Exercise Plan Consider gym membership - discussed once with patient already.   Consulted and Agree with Plan of Care Patient        Problem List Patient Active Problem List   Diagnosis Date Noted  . S/P unilateral BKA (below knee amputation) (HCC) 04/24/2015  . 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 Leukocytosis, unspecified 04/22/2013  . HTN (hypertension),   benign 04/18/2013  . Diabetes mellitus ([place]) 04/18/2013  . Syncope 04/18/2013  . Anemia 04/18/2013  . End stage renal disease ([place]) 09/02/2011    Claudia Greenley PT, DPT 10/17/2015, 11:03 AM  [facility] [address] [place], [place], [address] Phone: [phone]   Fax:  [phone]  Name: Jackie [name] MRN: [id number] Date of Birth: 04-Feb-1935

## 2015-10-18 ENCOUNTER — Ambulatory Visit: Payer: Medicare Other | Admitting: Physical Therapy

## 2015-10-18 ENCOUNTER — Encounter: Payer: Self-pay | Admitting: Physical Therapy

## 2015-10-18 DIAGNOSIS — R269 Unspecified abnormalities of gait and mobility: Secondary | ICD-10-CM

## 2015-10-18 DIAGNOSIS — R6889 Other general symptoms and signs: Secondary | ICD-10-CM | POA: Diagnosis not present

## 2015-10-18 DIAGNOSIS — E1129 Type 2 diabetes mellitus with other diabetic kidney complication: Secondary | ICD-10-CM | POA: Diagnosis not present

## 2015-10-18 DIAGNOSIS — R2681 Unsteadiness on feet: Secondary | ICD-10-CM | POA: Diagnosis not present

## 2015-10-18 DIAGNOSIS — Z992 Dependence on renal dialysis: Secondary | ICD-10-CM | POA: Diagnosis not present

## 2015-10-18 DIAGNOSIS — Z89512 Acquired absence of left leg below knee: Secondary | ICD-10-CM | POA: Diagnosis not present

## 2015-10-18 DIAGNOSIS — N186 End stage renal disease: Secondary | ICD-10-CM | POA: Diagnosis not present

## 2015-10-18 DIAGNOSIS — Z4789 Encounter for other orthopedic aftercare: Secondary | ICD-10-CM

## 2015-10-18 DIAGNOSIS — R531 Weakness: Secondary | ICD-10-CM

## 2015-10-18 DIAGNOSIS — Z5189 Encounter for other specified aftercare: Secondary | ICD-10-CM | POA: Diagnosis not present

## 2015-10-18 DIAGNOSIS — R2689 Other abnormalities of gait and mobility: Secondary | ICD-10-CM

## 2015-10-18 NOTE — Patient Instructions (Signed)
How to determine weight of prosthesis:  1. Have them weigh you, the wheelchair, and the prosthesis together (including liner and socks).   2. Have them weigh you and the wheelchair. Leave the prosthesis off for this weigh-in (don't have the liner or socks on either).  3. Subtract second weight from first weight. This weight is how much your prosthesis weighs.  4. REMEMBER to alert them to your prosthesis weight or they will get your dry weight wrong.     Also, make sure they weigh your wheelchair by itself to determine exactly how much it weighs. This could be throwing off your dry weight if it's recorded incorrectly.

## 2015-10-18 NOTE — Therapy (Signed)
[place] [address] [place], [place], [address] Phone: [phone]   Fax:  [phone]  Physical Therapy Treatment  Patient Details  Name: Jackie [name] MRN: [id number] Date of Birth: 05-22-1935 Referring Provider: Meridee Score, MD  Encounter Date: 10/18/2015      PT End of Session - 10/18/15 1105    Visit Number 12   Number of Visits 18   Date for PT Re-Evaluation 11/08/15   Authorization Type Medicare G-code & progress report every 10th visit   PT Start Time 1017   PT Stop Time 1100   PT Time Calculation (min) 43 min   Equipment Utilized During Treatment Gait belt   Activity Tolerance Patient tolerated treatment well   Behavior During Therapy [facility] for tasks assessed/performed      Past Medical History  Diagnosis Date  . Hypertension   . Diabetes mellitus   . Thyroid disease     pt denies this  . Anemia   . Hyperlipidemia   . Arthritis   . Pneumonia   . End-stage renal disease on hemodialysis [facility])     dialysis T/Th/Sa    Past Surgical History  Procedure Laterality Date  . Arteriovenous graft placement Left     non function  . Eye surgery Bilateral     catarct  . Cesarean section    . Av fistula placement Right 01/30/2013    Procedure: ARTERIOVENOUS (AV) FISTULA CREATION;  Surgeon: Conrad Accoville, MD;  Location: [place];  Service: Vascular;  Laterality: Right;  . Insertion of dialysis catheter Right 04/24/2013    Procedure: INSERTION OF DIALYSIS CATHETER;  Surgeon: Rosetta Posner, MD;  Location: [place];  Service: Vascular;  Laterality: Right;  . Fistula superficialization Right 04/24/2013    Procedure: FISTULA SUPERFICIALIZATION;  Surgeon: Rosetta Posner, MD;  Location: [place];  Service: Vascular;  Laterality: Right;  . Fistulogram Right 07/26/2013    Procedure: FISTULOGRAM;  Surgeon: Rosetta Posner, MD;  Location: [facility] CATH LAB;  Service: Cardiovascular;  Laterality: Right;  . Peripheral vascular  catheterization N/A 12/05/2014    Procedure: Abdominal Aortogram;  Surgeon: Conrad Wickes, MD;  Location: [place] CV LAB;  Service: Cardiovascular;  Laterality: N/A;  . Peripheral vascular catheterization N/A 03/19/2015    Procedure: Lower Extremity Angiography;  Surgeon: Serafina Mitchell, MD;  Location: [place] CV LAB;  Service: Cardiovascular;  Laterality: N/A;  . Peripheral vascular catheterization  03/19/2015    Procedure: Peripheral Vascular Intervention;  Surgeon: Serafina Mitchell, MD;  Location: [place] CV LAB;  Service: Cardiovascular;;  left sfa and popliteal stent  . Tonsillectomy    . Amputation Left 04/24/2015    Procedure: LEFT BELOW KNEE AMPUTATION;  Surgeon: Newt Minion, MD;  Location: [place];  Service: Orthopedics;  Laterality: Left;    There were no vitals filed for this visit.  Visit Diagnosis:  Unsteadiness  Abnormality of gait  Encounter for prosthetic gait training  Decreased functional activity tolerance  Status post below knee amputation of left lower extremity (HCC)  Weakness generalized  Balance problems      Subjective Assessment - 10/18/15 1020    Subjective Reports she does not wear prosthesis to dialysis yet and flucutates wear on dialysis days from 3 hr to ~50% of awake hours after dialysis.   Patient is accompained by: Family member   Pertinent History HTN, DM, thyroid disease, HLD, arthritis, ESRD, pneumonia, syncope   Patient Stated Goals She  wants to prosthesis to walk in community & home.    Currently in Pain? No/denies          [facility] Adult PT Treatment/Exercise - 10/18/15 1022    Ambulation/Gait   Ambulation/Gait Yes   Ambulation/Gait Assistance 5: Supervision   Ambulation/Gait Assistance Details Patient demo increased weight shift to L side from previous sessions but decreased with fatigue after trials of curb and ramp.   Ambulation Distance (Feet) 210 Feet  x2   Assistive device Straight cane;Prosthesis   Gait Pattern  Decreased step length - right;Decreased stance time - left;Decreased stride length;Decreased hip/knee flexion - left;Decreased weight shift to left;Left steppage;Antalgic;Trunk flexed;Poor foot clearance - left;Step-through pattern   Ambulation Surface Level;Indoor   Ramp 4: Min assist   Ramp Details (indicate cue type and reason) Verbal cues for sequencing and technique. Patient struggles with proper sequencing even with demonstration and verbal cues.   Curb 4: Min assist   Curb Details (indicate cue type and reason) Verbal cues for sequencing and technique. Patient struggles with sequencing and technique despite demonstration and verbal cues.   Gait Comments Second set of 230' after ramp and curb with patient displaying decreased weight shift in second trial due to fatigue from barriers. Initial gait trial patient demo increased weight shift to L from previous sessions.   Neuro Re-ed    Neuro Re-ed Details  // bars static balance on Airex balance pad with narrow stance with head movement up/down, left/right, diagonally min/guard to minA with two instances LOB noted to side during diagonal head movement with patient recovery UE prn.    Prosthetics   Prosthetic Care Comments  Patient reminded to weigh prosthesis to prepare for wearing to dialysis. Educated in proper method. Written instructions provided. On discussion with pt it appears she has been assuming her wheelchair weight was the number imprinted on it (possibly the size, not weight). She states she has never had it weighed and just told them the number printed on it. Educated pt to have her wheelchair weighed to insure they are using the correct weight and why this is important. This was included in her written instructions.    Current prosthetic wear tolerance (days/week)  daily   Current prosthetic wear tolerance (#hours/day)  wearing non-dialysis days 4-5 hrs 2x/day; see subjective for dialysis days. PT instructed to wear daily for all awake  hours excetpt 1-2 hrs mid-day.    Residual limb condition  normal moisture, no open areas, clean skin   Education Provided Skin check;Proper wear schedule/adjustment   Person(s) Educated Patient   Education Method Explanation;Handout   Education Method Verbalized understanding;Needs further instruction   Donning Prosthesis Supervision   Doffing Prosthesis Modified independent (device/increased time)          PT Education - 10/18/15 1103    Education provided Yes   Education Details Discussed gym membership again with patient; discussed benefits of maximizing therapy gains. Patient stated she would look into it. Educated patient in proper weighing of prosthesis for dialysis.    Person(s) Educated Patient   Methods Explanation;Handout   Comprehension Verbalized understanding;Need further instruction          PT Short Term Goals - 10/09/15 1354    PT SHORT TERM GOAL #1   Title Patient demonstrates proper donning/doffing, verbalizes proper cleaning & weighing /wearing at dialysis. (Target Date: 10/09/2015)   Baseline 10/09/15 Patient demonstrates proper doffing, verbalizes proper cleaning and weighting/wearing at dialysis. Patient continues to struggle with proper alignment of liner.  Time 1   Period Months   Status On-going   PT SHORT TERM GOAL #2   Title Patient tolerates prosthesis wear >10 hrs total /day without skin issues or tenderness. (Target Date: 10/09/2015)   Baseline 10/09/15 Patient tolerates 8 hrs total/day without skin issues or tenderness with wear schedule set for 10 hrs/day.   Time 1   Period Months   Status On-going   PT SHORT TERM GOAL #3   Title Patient reaches 7" anteriorly, laterally, across midline and to floor with supervision. (Target Date: 10/09/2015)   Baseline Patient reaches at least 7" anteriorly, laterally, and across midline. Patient able to reach to floor with supervision   Time 1   Period Months   Status Achieved   PT SHORT TERM GOAL #4   Title  Patient ambulates 200' with prosthesis & RW with supervision. (Target Date: 10/09/2015)   Baseline Achieved 09/20/2015 at 350' with prosthesis & RW with supervision.   Time 1   Period Months   Status Achieved   PT SHORT TERM GOAL #5   Title Patient negotiates ramps, curbs & stairs (2 rails) with RW & prosthesis with supervision. (Target Date: 10/09/2015)   Baseline Goal MET 10/09/15   Time 1   Period Months   Status Achieved          PT Long Term Goals - 10/16/15 1100    PT LONG TERM GOAL #1   Title Patient is independent with prosthetic care including using prosthesis at dialysis to enable safe utilization of prosthesis. (Target Date: 11/08/2015)   Time 2   Period Months   Status On-going   PT LONG TERM GOAL #2   Title Patient tolerates wear of prosthesis >90% of awake hours to enable function majority of her day. (Target Date: 11/08/2015)   Time 2   Period Months   Status On-going   PT LONG TERM GOAL #3   Title Berg Balance >/= 45/56 to indicate lower fall risk. (Target Date: 11/08/2015)   Time 2   Period Months   Status On-going   PT LONG TERM GOAL #4   Title Pateint ambulates 300' with LRAD & prosthesis including ramps & curbs modified independent to enable community mobility. (Target Date: 11/08/2015)   Time 2   Period Months   Status On-going   PT LONG TERM GOAL #5   Title Patient ambulates household distances with LRAD & prosthesis modified independent. (Target Date: 11/08/2015)   Time 2   Period Months   Status On-going          Plan - 10/18/15 1105    Clinical Impression Statement Skilled session addressing prosthetic care including weighing for dialysis to prepare for wearing prosthesis to dialysis, gait with cane including barriers, and static balance activities. Patient struggles with sequencing for ramp and curb with cane despite demonstration and verbal cues. Gait visibly improved from previous sessions with better weight shifting to L side. Patient is making  steady progress toward goals.   Pt will benefit from skilled therapeutic intervention in order to improve on the following deficits Abnormal gait;Decreased activity tolerance;Decreased balance;Decreased endurance;Decreased knowledge of precautions;Decreased knowledge of use of DME;Decreased mobility;Decreased strength;Postural dysfunction;Prosthetic Dependency   Rehab Potential Good   PT Frequency 2x / week   PT Duration Other (comment)  9 weeks (60 days)   PT Treatment/Interventions ADLs/Self Care Home Management;DME Instruction;Gait training;Stair training;Functional mobility training;Therapeutic activities;Therapeutic exercise;Balance training;Neuromuscular re-education;Patient/family education;Prosthetic Training   PT Next Visit Plan Continue with gait & balance towards  LTGs, check again if she determined weight of prosthesis.   PT Home Exercise Plan Consider gym membership - discussed twice with patient already. Offer programs.   Consulted and Agree with Plan of Care Patient      Problem List Patient Active Problem List   Diagnosis Date Noted  . S/P unilateral BKA (below knee amputation) ([place]) 04/24/2015  . Foot ulcer with necrosis of muscle ([place]) 12/04/2014  . Leukocytosis, unspecified 04/22/2013  . HTN (hypertension), benign 04/18/2013  . Diabetes mellitus ([place]) 04/18/2013  . Syncope 04/18/2013  . Anemia 04/18/2013  . End stage renal disease [facility]) 09/02/2011    Bayard Beaver, SPTA 10/18/2015, 11:19 AM  [facility] [address] [place], [place], [address] Phone: [phone]   Fax:  [phone]  Name: Jackie [name] MRN: [id number] Date of Birth: 1935/02/24  This note has been reviewed and edited by supervising CI.  Willow Ora, PTA, [place] [address], [place], [place] [address] [phone] 10/18/2015, 1:21 PM

## 2015-10-19 DIAGNOSIS — N186 End stage renal disease: Secondary | ICD-10-CM | POA: Diagnosis not present

## 2015-10-19 DIAGNOSIS — N2581 Secondary hyperparathyroidism of renal origin: Secondary | ICD-10-CM | POA: Diagnosis not present

## 2015-10-19 DIAGNOSIS — E1129 Type 2 diabetes mellitus with other diabetic kidney complication: Secondary | ICD-10-CM | POA: Diagnosis not present

## 2015-10-19 DIAGNOSIS — D509 Iron deficiency anemia, unspecified: Secondary | ICD-10-CM | POA: Diagnosis not present

## 2015-10-19 DIAGNOSIS — D631 Anemia in chronic kidney disease: Secondary | ICD-10-CM | POA: Diagnosis not present

## 2015-10-22 DIAGNOSIS — D631 Anemia in chronic kidney disease: Secondary | ICD-10-CM | POA: Diagnosis not present

## 2015-10-22 DIAGNOSIS — E1129 Type 2 diabetes mellitus with other diabetic kidney complication: Secondary | ICD-10-CM | POA: Diagnosis not present

## 2015-10-22 DIAGNOSIS — N186 End stage renal disease: Secondary | ICD-10-CM | POA: Diagnosis not present

## 2015-10-22 DIAGNOSIS — N2581 Secondary hyperparathyroidism of renal origin: Secondary | ICD-10-CM | POA: Diagnosis not present

## 2015-10-22 DIAGNOSIS — D509 Iron deficiency anemia, unspecified: Secondary | ICD-10-CM | POA: Diagnosis not present

## 2015-10-23 ENCOUNTER — Encounter: Payer: Self-pay | Admitting: Physical Therapy

## 2015-10-23 ENCOUNTER — Ambulatory Visit: Payer: Medicare Other | Attending: Orthopedic Surgery | Admitting: Physical Therapy

## 2015-10-23 DIAGNOSIS — R2681 Unsteadiness on feet: Secondary | ICD-10-CM | POA: Diagnosis not present

## 2015-10-23 DIAGNOSIS — M6281 Muscle weakness (generalized): Secondary | ICD-10-CM | POA: Insufficient documentation

## 2015-10-23 DIAGNOSIS — R2689 Other abnormalities of gait and mobility: Secondary | ICD-10-CM | POA: Diagnosis not present

## 2015-10-24 DIAGNOSIS — N186 End stage renal disease: Secondary | ICD-10-CM | POA: Diagnosis not present

## 2015-10-24 DIAGNOSIS — N2581 Secondary hyperparathyroidism of renal origin: Secondary | ICD-10-CM | POA: Diagnosis not present

## 2015-10-24 DIAGNOSIS — D631 Anemia in chronic kidney disease: Secondary | ICD-10-CM | POA: Diagnosis not present

## 2015-10-24 DIAGNOSIS — E1129 Type 2 diabetes mellitus with other diabetic kidney complication: Secondary | ICD-10-CM | POA: Diagnosis not present

## 2015-10-24 DIAGNOSIS — D509 Iron deficiency anemia, unspecified: Secondary | ICD-10-CM | POA: Diagnosis not present

## 2015-10-24 NOTE — Therapy (Signed)
[facility] [address] [place], [place], [address] Phone: [phone]   Fax:  [phone]  Physical Therapy Treatment  Patient Details  Name: Jackie [name] MRN: [id number] Date of Birth: March 05, 1935 Referring Provider: Meridee Score, MD  Encounter Date: 10/23/2015      PT End of Session - 10/23/15 1400    Visit Number 12   Number of Visits 18   Date for PT Re-Evaluation 11/08/15   Authorization Type Medicare G-code & progress report every 10th visit   PT Start Time 1015   PT Stop Time 1100   PT Time Calculation (min) 45 min   Equipment Utilized During Treatment Gait belt   Activity Tolerance Patient tolerated treatment well   Behavior During Therapy [facility] for tasks assessed/performed      Past Medical History  Diagnosis Date  . Hypertension   . Diabetes mellitus   . Thyroid disease     pt denies this  . Anemia   . Hyperlipidemia   . Arthritis   . Pneumonia   . End-stage renal disease on hemodialysis [facility])     dialysis T/Th/Sa    Past Surgical History  Procedure Laterality Date  . Arteriovenous graft placement Left     non function  . Eye surgery Bilateral     catarct  . Cesarean section    . Av fistula placement Right 01/30/2013    Procedure: ARTERIOVENOUS (AV) FISTULA CREATION;  Surgeon: Conrad [place], MD;  Location: [place];  Service: Vascular;  Laterality: Right;  . Insertion of dialysis catheter Right 04/24/2013    Procedure: INSERTION OF DIALYSIS CATHETER;  Surgeon: Rosetta Posner, MD;  Location: [place];  Service: Vascular;  Laterality: Right;  . Fistula superficialization Right 04/24/2013    Procedure: FISTULA SUPERFICIALIZATION;  Surgeon: Rosetta Posner, MD;  Location: [place];  Service: Vascular;  Laterality: Right;  . Fistulogram Right 07/26/2013    Procedure: FISTULOGRAM;  Surgeon: Rosetta Posner, MD;  Location: [facility] CATH LAB;  Service: Cardiovascular;  Laterality: Right;  . Peripheral vascular catheterization  N/A 12/05/2014    Procedure: Abdominal Aortogram;  Surgeon: Conrad [place], MD;  Location: [place] CV LAB;  Service: Cardiovascular;  Laterality: N/A;  . Peripheral vascular catheterization N/A 03/19/2015    Procedure: Lower Extremity Angiography;  Surgeon: Serafina Mitchell, MD;  Location: [place] CV LAB;  Service: Cardiovascular;  Laterality: N/A;  . Peripheral vascular catheterization  03/19/2015    Procedure: Peripheral Vascular Intervention;  Surgeon: Serafina Mitchell, MD;  Location: [place] CV LAB;  Service: Cardiovascular;;  left sfa and popliteal stent  . Tonsillectomy    . Amputation Left 04/24/2015    Procedure: LEFT BELOW KNEE AMPUTATION;  Surgeon: Newt Minion, MD;  Location: [place];  Service: Orthopedics;  Laterality: Left;    There were no vitals filed for this visit.  Visit Diagnosis:  Unsteadiness on feet  Other abnormalities of gait and mobility  Muscle weakness (generalized)      Subjective Assessment - 10/23/15 1015    Subjective Prosthesis weighs 2 kg. She wore it to dialysis yesterday and had them subtract weight from weigh-in & weigh-out as PT advised and no issues.    Patient is accompained by: Family member   Pertinent History HTN, DM, thyroid disease, HLD, arthritis, ESRD, pneumonia, syncope   Patient Stated Goals She wants to prosthesis to walk in community & home.    Currently in Pain? No/denies  [facility] Adult PT Treatment/Exercise - 10/23/15 1015    Ambulation/Gait   Ambulation/Gait Yes   Ambulation/Gait Assistance 5: Supervision   Ambulation/Gait Assistance Details verbal & tactile cues on posture & step length. Worked on scanning environment while ambulating while maintaining path & pace.    Ambulation Distance (Feet) 400 Feet  400'   Assistive device Straight cane;Prosthesis   Gait Pattern Decreased step length - right;Decreased stance time - left;Decreased stride length;Decreased hip/knee flexion -  left;Decreased weight shift to left;Left steppage;Antalgic;Trunk flexed;Poor foot clearance - left;Step-through pattern   Ramp 4: Min assist  cane & prosthesis   Curb 4: Min assist  cane & prosthesis   Gait Comments --   Neuro Re-ed    Neuro Re-ed Details  --   Prosthetics   Prosthetic Care Comments  --   Current prosthetic wear tolerance (days/week)  daily   Current prosthetic wear tolerance (#hours/day)  wearing prosthesis 5-6 hours 2x/day on non-dialysis day and same on dialysis days but removes to take nap after she gets home from dialysis.    Residual limb condition  normal moisture, no open areas, clean skin   Education Provided Skin check;Proper wear schedule/adjustment   Person(s) Educated Patient   Education Method Explanation;Verbal cues   Education Method Verbalized understanding;Verbal cues required;Needs further instruction   Donning Prosthesis Modified independent (device/increased time)   Doffing Prosthesis Modified independent (device/increased time)                  PT Short Term Goals - 10/23/15 1100    PT SHORT TERM GOAL #1   Title Patient demonstrates proper donning/doffing, verbalizes proper cleaning & weighing /wearing at dialysis. (Target Date: 10/09/2015)   Baseline 10/09/15 Patient demonstrates proper doffing, verbalizes proper cleaning and weighting/wearing at dialysis.   Time 1   Period Months   Status Achieved   PT SHORT TERM GOAL #2   Title Patient tolerates prosthesis wear >10 hrs total /day without skin issues or tenderness. (Target Date: 10/09/2015)   Baseline 10/09/15 Patient tolerates 8 hrs total/day without skin issues or tenderness with wear schedule set for 10 hrs/day.   Time 1   Period Months   Status Partially Met   PT SHORT TERM GOAL #3   Title Patient reaches 7" anteriorly, laterally, across midline and to floor with supervision. (Target Date: 10/09/2015)   Baseline Patient reaches at least 7" anteriorly, laterally, and across  midline. Patient able to reach to floor with supervision   Time 1   Period Months   Status Achieved   PT SHORT TERM GOAL #4   Title Patient ambulates 200' with prosthesis & RW with supervision. (Target Date: 10/09/2015)   Baseline Achieved 09/20/2015 at 350' with prosthesis & RW with supervision.   Time 1   Period Months   Status Achieved   PT SHORT TERM GOAL #5   Title Patient negotiates ramps, curbs & stairs (2 rails) with RW & prosthesis with supervision. (Target Date: 10/09/2015)   Baseline Goal MET 10/09/15   Time 1   Period Months   Status Achieved           PT Long Term Goals - 10/16/15 1100    PT LONG TERM GOAL #1   Title Patient is independent with prosthetic care including using prosthesis at dialysis to enable safe utilization of prosthesis. (Target Date: 11/08/2015)   Time 2   Period Months   Status On-going   PT LONG TERM GOAL #2   Title Patient tolerates  wear of prosthesis >90% of awake hours to enable function majority of her day. (Target Date: 11/08/2015)   Time 2   Period Months   Status On-going   PT LONG TERM GOAL #3   Title Berg Balance >/= 45/56 to indicate lower fall risk. (Target Date: 11/08/2015)   Time 2   Period Months   Status On-going   PT LONG TERM GOAL #4   Title Pateint ambulates 300' with LRAD & prosthesis including ramps & curbs modified independent to enable community mobility. (Target Date: 11/08/2015)   Time 2   Period Months   Status On-going   PT LONG TERM GOAL #5   Title Patient ambulates household distances with LRAD & prosthesis modified independent. (Target Date: 11/08/2015)   Time 2   Period Months   Status On-going               Plan - 10/23/15 1100    Clinical Impression Statement Patient appears to be wearing prosthesis longer hours including to dialysis without issues. Patient improved gait with cane but still needs assistance for safety. Patient is on target to meet LTGs with RW but may need longer time to ambulate at  community level with cane.    Pt will benefit from skilled therapeutic intervention in order to improve on the following deficits Abnormal gait;Decreased activity tolerance;Decreased balance;Decreased endurance;Decreased knowledge of precautions;Decreased knowledge of use of DME;Decreased mobility;Decreased strength;Postural dysfunction;Prosthetic Dependency   Rehab Potential Good   PT Frequency 2x / week   PT Duration Other (comment)  9 weeks (60 days)   PT Treatment/Interventions ADLs/Self Care Home Management;DME Instruction;Gait training;Stair training;Functional mobility training;Therapeutic activities;Therapeutic exercise;Balance training;Neuromuscular re-education;Patient/family education;Prosthetic Training   PT Next Visit Plan Continue with gait & balance towards LTG   PT Home Exercise Plan Consider gym membership - discussed twice with patient already. Offer programs.   Consulted and Agree with Plan of Care Patient        Problem List Patient Active Problem List   Diagnosis Date Noted  . S/P unilateral BKA (below knee amputation) ([place]) 04/24/2015  . Foot ulcer with necrosis of muscle ([place]) 12/04/2014  . Leukocytosis, unspecified 04/22/2013  . HTN (hypertension), benign 04/18/2013  . Diabetes mellitus ([place]) 04/18/2013  . Syncope 04/18/2013  . Anemia 04/18/2013  . End stage renal disease ([place]) 09/02/2011    Bensen Chadderdon PT, DPT 10/24/2015, 2:35 PM  [facility] [address] [place], [place], [address] Phone: [phone]   Fax:  [phone]  Name: Jackie [name] MRN: [id number] Date of Birth: Dec 15, 1934

## 2015-10-25 ENCOUNTER — Encounter: Payer: Self-pay | Admitting: Physical Therapy

## 2015-10-25 ENCOUNTER — Ambulatory Visit: Payer: Medicare Other | Admitting: Physical Therapy

## 2015-10-25 DIAGNOSIS — M6281 Muscle weakness (generalized): Secondary | ICD-10-CM

## 2015-10-25 DIAGNOSIS — R2689 Other abnormalities of gait and mobility: Secondary | ICD-10-CM | POA: Diagnosis not present

## 2015-10-25 DIAGNOSIS — R2681 Unsteadiness on feet: Secondary | ICD-10-CM | POA: Diagnosis not present

## 2015-10-25 NOTE — Therapy (Signed)
[facility] [address] [place], [place], [address] Phone: [phone]   Fax:  [phone]  Physical Therapy Treatment  Patient Details  Name: Jackie [name] MRN: [id number] Date of Birth: 02/20/1935 Referring Provider: Meridee Score, MD  Encounter Date: 10/25/2015      PT End of Session - 10/25/15 1023    Visit Number 14  adjusted due to miss numbering previous sessions   Number of Visits 18   Date for PT Re-Evaluation 11/08/15   Authorization Type Medicare G-code & progress report every 10th visit   PT Start Time 1020   PT Stop Time 1100   PT Time Calculation (min) 40 min   Equipment Utilized During Treatment Gait belt   Activity Tolerance Patient tolerated treatment well   Behavior During Therapy [facility] for tasks assessed/performed      Past Medical History  Diagnosis Date  . Hypertension   . Diabetes mellitus   . Thyroid disease     pt denies this  . Anemia   . Hyperlipidemia   . Arthritis   . Pneumonia   . End-stage renal disease on hemodialysis [facility])     dialysis T/Th/Sa    Past Surgical History  Procedure Laterality Date  . Arteriovenous graft placement Left     non function  . Eye surgery Bilateral     catarct  . Cesarean section    . Av fistula placement Right 01/30/2013    Procedure: ARTERIOVENOUS (AV) FISTULA CREATION;  Surgeon: Conrad Olivet, MD;  Location: [place];  Service: Vascular;  Laterality: Right;  . Insertion of dialysis catheter Right 04/24/2013    Procedure: INSERTION OF DIALYSIS CATHETER;  Surgeon: Rosetta Posner, MD;  Location: [place];  Service: Vascular;  Laterality: Right;  . Fistula superficialization Right 04/24/2013    Procedure: FISTULA SUPERFICIALIZATION;  Surgeon: Rosetta Posner, MD;  Location: [place];  Service: Vascular;  Laterality: Right;  . Fistulogram Right 07/26/2013    Procedure: FISTULOGRAM;  Surgeon: Rosetta Posner, MD;  Location: [facility] CATH LAB;  Service: Cardiovascular;   Laterality: Right;  . Peripheral vascular catheterization N/A 12/05/2014    Procedure: Abdominal Aortogram;  Surgeon: Conrad [place], MD;  Location: [place] CV LAB;  Service: Cardiovascular;  Laterality: N/A;  . Peripheral vascular catheterization N/A 03/19/2015    Procedure: Lower Extremity Angiography;  Surgeon: Serafina Mitchell, MD;  Location: [place] CV LAB;  Service: Cardiovascular;  Laterality: N/A;  . Peripheral vascular catheterization  03/19/2015    Procedure: Peripheral Vascular Intervention;  Surgeon: Serafina Mitchell, MD;  Location: [place] CV LAB;  Service: Cardiovascular;;  left sfa and popliteal stent  . Tonsillectomy    . Amputation Left 04/24/2015    Procedure: LEFT BELOW KNEE AMPUTATION;  Surgeon: Newt Minion, MD;  Location: [place];  Service: Orthopedics;  Laterality: Left;    There were no vitals filed for this visit.      Subjective Assessment - 10/25/15 1022    Subjective No new complaints. Denies any falls or pain.    Pertinent History HTN, DM, thyroid disease, HLD, arthritis, ESRD, pneumonia, syncope   Patient Stated Goals She wants to prosthesis to walk in community & home.    Currently in Pain? No/denies             [facility] Adult PT Treatment/Exercise - 10/25/15 1024    Transfers   Transfers Sit to Stand;Stand to Sit   Sit to Stand 5:  Supervision;With upper extremity assist;With armrests;From chair/3-in-1   Sit to Stand Details Verbal cues for sequencing;Verbal cues for technique;Verbal cues for precautions/safety;Verbal cues for safe use of DME/AE   Stand to Sit 4: Min guard;With upper extremity assist;With armrests;To chair/3-in-1;4: Min assist   Stand to Sit Details (indicate cue type and reason) Verbal cues for sequencing;Verbal cues for technique;Verbal cues for precautions/safety;Verbal cues for safe use of DME/AE   Stand to Sit Details pt with slight balance loss with 90* turn to sit into chair needing up to min assist to sit safely. improved with  consective reps after 1st one.    Ambulation/Gait   Ambulation/Gait Yes   Ambulation/Gait Assistance 5: Supervision;4: Min assist;4: Min guard   Ambulation/Gait Assistance Details cues on posture and sequencing, base of support and step length. Loss of balance x 3 needing up to min assist to correct, maintained min guard assist after second balance loss for safety.                                  Ambulation Distance (Feet) 335 Feet  x1, 220 x1   Assistive device Straight cane;Prosthesis   Ramp 4: Min assist  cane and prosthesis   Ramp Details (indicate cue type and reason) cues on sequencing and technique   Curb 4: Min assist  cane and prosthesis   Curb Details (indicate cue type and reason) cues on sequencing and technique   Knee/Hip Exercises: Aerobic   Other Aerobic Scifit x 4 extremities level 1.6 x 9 mintues with goal rpm >/=45 for strengthening and activity tolerance.                      Prosthetics   Current prosthetic wear tolerance (days/week)  daily   Current prosthetic wear tolerance (#hours/day)  wearing prosthesis 5-6 hours 2x/day on non-dialysis day and same on dialysis days but removes to take nap after she gets home from dialysis.    Residual limb condition  intact per pt report   Donning Prosthesis Modified independent (device/increased time)   Doffing Prosthesis Modified independent (device/increased time)            PT Short Term Goals - 10/23/15 1100    PT SHORT TERM GOAL #1   Title Patient demonstrates proper donning/doffing, verbalizes proper cleaning & weighing /wearing at dialysis. (Target Date: 10/09/2015)   Baseline 10/09/15 Patient demonstrates proper doffing, verbalizes proper cleaning and weighting/wearing at dialysis.   Time 1   Period Months   Status Achieved   PT SHORT TERM GOAL #2   Title Patient tolerates prosthesis wear >10 hrs total /day without skin issues or tenderness. (Target Date: 10/09/2015)   Baseline 10/09/15 Patient tolerates 8 hrs  total/day without skin issues or tenderness with wear schedule set for 10 hrs/day.   Time 1   Period Months   Status Partially Met   PT SHORT TERM GOAL #3   Title Patient reaches 7" anteriorly, laterally, across midline and to floor with supervision. (Target Date: 10/09/2015)   Baseline Patient reaches at least 7" anteriorly, laterally, and across midline. Patient able to reach to floor with supervision   Time 1   Period Months   Status Achieved   PT SHORT TERM GOAL #4   Title Patient ambulates 200' with prosthesis & RW with supervision. (Target Date: 10/09/2015)   Baseline Achieved 09/20/2015 at 350' with prosthesis & RW with supervision.  Time 1   Period Months   Status Achieved   PT SHORT TERM GOAL #5   Title Patient negotiates ramps, curbs & stairs (2 rails) with RW & prosthesis with supervision. (Target Date: 10/09/2015)   Baseline Goal MET 10/09/15   Time 1   Period Months   Status Achieved           PT Long Term Goals - 10/16/15 1100    PT LONG TERM GOAL #1   Title Patient is independent with prosthetic care including using prosthesis at dialysis to enable safe utilization of prosthesis. (Target Date: 11/08/2015)   Time 2   Period Months   Status On-going   PT LONG TERM GOAL #2   Title Patient tolerates wear of prosthesis >90% of awake hours to enable function majority of her day. (Target Date: 11/08/2015)   Time 2   Period Months   Status On-going   PT LONG TERM GOAL #3   Title Berg Balance >/= 45/56 to indicate lower fall risk. (Target Date: 11/08/2015)   Time 2   Period Months   Status On-going   PT LONG TERM GOAL #4   Title Pateint ambulates 300' with LRAD & prosthesis including ramps & curbs modified independent to enable community mobility. (Target Date: 11/08/2015)   Time 2   Period Months   Status On-going   PT LONG TERM GOAL #5   Title Patient ambulates household distances with LRAD & prosthesis modified independent. (Target Date: 11/08/2015)   Time 2    Period Months   Status On-going            Plan - 10/25/15 1024    Clinical Impression Statement Today's skilled session continued to focus on gait with cane/prosthesis with pt needing more assistance than with previous session due to increased instability at times. Pt continues to make progress toward goals.   Rehab Potential Good   PT Frequency 2x / week   PT Duration Other (comment)  9 weeks (60 days)   PT Treatment/Interventions ADLs/Self Care Home Management;DME Instruction;Gait training;Stair training;Functional mobility training;Therapeutic activities;Therapeutic exercise;Balance training;Neuromuscular re-education;Patient/family education;Prosthetic Training   PT Next Visit Plan Continue with gait & balance towards LTG   PT Home Exercise Plan Consider gym membership - discussed twice with patient already. Offer programs.   Consulted and Agree with Plan of Care Patient      Patient will benefit from skilled therapeutic intervention in order to improve the following deficits and impairments:  Abnormal gait, Decreased activity tolerance, Decreased balance, Decreased endurance, Decreased knowledge of precautions, Decreased knowledge of use of DME, Decreased mobility, Decreased strength, Postural dysfunction, Prosthetic Dependency  Visit Diagnosis: Unsteadiness on feet  Other abnormalities of gait and mobility  Muscle weakness (generalized)     Problem List Patient Active Problem List   Diagnosis Date Noted  . S/P unilateral BKA (below knee amputation) ([place]) 04/24/2015  . Foot ulcer with necrosis of muscle ([place]) 12/04/2014  . Leukocytosis, unspecified 04/22/2013  . HTN (hypertension), benign 04/18/2013  . Diabetes mellitus ([place]) 04/18/2013  . Syncope 04/18/2013  . Anemia 04/18/2013  . End stage renal disease ([place]) 09/02/2011    Willow Ora, PTA, [facility] [address] [place], [place] [address] [phone] 10/25/2015, 2:54  PM  Name: Jackie [name] MRN: [id number] Date of Birth: September 23, 1934

## 2015-10-26 DIAGNOSIS — D509 Iron deficiency anemia, unspecified: Secondary | ICD-10-CM | POA: Diagnosis not present

## 2015-10-26 DIAGNOSIS — D631 Anemia in chronic kidney disease: Secondary | ICD-10-CM | POA: Diagnosis not present

## 2015-10-26 DIAGNOSIS — N2581 Secondary hyperparathyroidism of renal origin: Secondary | ICD-10-CM | POA: Diagnosis not present

## 2015-10-26 DIAGNOSIS — N186 End stage renal disease: Secondary | ICD-10-CM | POA: Diagnosis not present

## 2015-10-26 DIAGNOSIS — E1129 Type 2 diabetes mellitus with other diabetic kidney complication: Secondary | ICD-10-CM | POA: Diagnosis not present

## 2015-10-28 ENCOUNTER — Ambulatory Visit: Payer: Medicare Other | Admitting: Physical Therapy

## 2015-10-29 DIAGNOSIS — E1129 Type 2 diabetes mellitus with other diabetic kidney complication: Secondary | ICD-10-CM | POA: Diagnosis not present

## 2015-10-29 DIAGNOSIS — D509 Iron deficiency anemia, unspecified: Secondary | ICD-10-CM | POA: Diagnosis not present

## 2015-10-29 DIAGNOSIS — D631 Anemia in chronic kidney disease: Secondary | ICD-10-CM | POA: Diagnosis not present

## 2015-10-29 DIAGNOSIS — N186 End stage renal disease: Secondary | ICD-10-CM | POA: Diagnosis not present

## 2015-10-29 DIAGNOSIS — N2581 Secondary hyperparathyroidism of renal origin: Secondary | ICD-10-CM | POA: Diagnosis not present

## 2015-10-30 ENCOUNTER — Ambulatory Visit: Payer: Medicare Other | Admitting: Physical Therapy

## 2015-10-30 ENCOUNTER — Encounter: Payer: Self-pay | Admitting: Physical Therapy

## 2015-10-30 DIAGNOSIS — R2689 Other abnormalities of gait and mobility: Secondary | ICD-10-CM

## 2015-10-30 DIAGNOSIS — R2681 Unsteadiness on feet: Secondary | ICD-10-CM | POA: Diagnosis not present

## 2015-10-30 DIAGNOSIS — M6281 Muscle weakness (generalized): Secondary | ICD-10-CM

## 2015-10-30 NOTE — Therapy (Signed)
[facility] [address] [place], [place], [address] Phone: [phone]   Fax:  [phone]  Physical Therapy Treatment  Patient Details  Name: Jackie [name] MRN: [id number] Date of Birth: Oct 05, 1934 Referring Provider: Meridee Score, MD  Encounter Date: 10/30/2015      PT End of Session - 10/30/15 2310    Visit Number 15   Number of Visits 18   Date for PT Re-Evaluation 11/08/15   Authorization Type Medicare G-code & progress report every 10th visit   PT Start Time 1015   PT Stop Time 1059   PT Time Calculation (min) 44 min   Equipment Utilized During Treatment Gait belt   Activity Tolerance Patient tolerated treatment well   Behavior During Therapy [facility] for tasks assessed/performed      Past Medical History  Diagnosis Date  . Hypertension   . Diabetes mellitus   . Thyroid disease     pt denies this  . Anemia   . Hyperlipidemia   . Arthritis   . Pneumonia   . End-stage renal disease on hemodialysis [facility])     dialysis T/Th/Sa    Past Surgical History  Procedure Laterality Date  . Arteriovenous graft placement Left     non function  . Eye surgery Bilateral     catarct  . Cesarean section    . Av fistula placement Right 01/30/2013    Procedure: ARTERIOVENOUS (AV) FISTULA CREATION;  Surgeon: Conrad Carmichael, MD;  Location: [place];  Service: Vascular;  Laterality: Right;  . Insertion of dialysis catheter Right 04/24/2013    Procedure: INSERTION OF DIALYSIS CATHETER;  Surgeon: Rosetta Posner, MD;  Location: [place];  Service: Vascular;  Laterality: Right;  . Fistula superficialization Right 04/24/2013    Procedure: FISTULA SUPERFICIALIZATION;  Surgeon: Rosetta Posner, MD;  Location: [place];  Service: Vascular;  Laterality: Right;  . Fistulogram Right 07/26/2013    Procedure: FISTULOGRAM;  Surgeon: Rosetta Posner, MD;  Location: [facility] CATH LAB;  Service: Cardiovascular;  Laterality: Right;  . Peripheral vascular  catheterization N/A 12/05/2014    Procedure: Abdominal Aortogram;  Surgeon: Conrad [place], MD;  Location: [place] CV LAB;  Service: Cardiovascular;  Laterality: N/A;  . Peripheral vascular catheterization N/A 03/19/2015    Procedure: Lower Extremity Angiography;  Surgeon: Serafina Mitchell, MD;  Location: [place] CV LAB;  Service: Cardiovascular;  Laterality: N/A;  . Peripheral vascular catheterization  03/19/2015    Procedure: Peripheral Vascular Intervention;  Surgeon: Serafina Mitchell, MD;  Location: [place] CV LAB;  Service: Cardiovascular;;  left sfa and popliteal stent  . Tonsillectomy    . Amputation Left 04/24/2015    Procedure: LEFT BELOW KNEE AMPUTATION;  Surgeon: Newt Minion, MD;  Location: [place];  Service: Orthopedics;  Laterality: Left;    There were no vitals filed for this visit.      Subjective Assessment - 10/30/15 1022    Subjective Wearing prosthesis on dialysis days all awake hours except removes while on dialysis and resting after dialysis. On non-dialysis days wears all awake hours except removes prosthesis only (not liner) to rest for 1.5 hrs in afternoon. No issues.   Patient is accompained by: Family member   Pertinent History HTN, DM, thyroid disease, HLD, arthritis, ESRD, pneumonia, syncope   Patient Stated Goals She wants to prosthesis to walk in community & home.    Currently in Pain? No/denies  [facility] Adult PT Treatment/Exercise - 10/30/15 1015    Transfers   Transfers Sit to Stand;Stand to Sit   Sit to Stand 5: Supervision;With upper extremity assist;With armrests;From chair/3-in-1  with no device for stabilization   Sit to Stand Details Verbal cues for sequencing;Verbal cues for technique;Verbal cues for precautions/safety;Verbal cues for safe use of DME/AE   Sit to Stand Details (indicate cue type and reason) verbal & demo cues on technique with rollator walker seat   Stand to Sit 5: Supervision;With upper  extremity assist;With armrests;To chair/3-in-1  no device for stabilization   Stand to Sit Details (indicate cue type and reason) Verbal cues for sequencing;Verbal cues for technique;Verbal cues for precautions/safety;Verbal cues for safe use of DME/AE   Stand to Sit Details verbal & demo cues on technique with rollator walker seat   Ambulation/Gait   Ambulation/Gait Yes   Ambulation/Gait Assistance 5: Supervision;4: Min assist;4: Min guard   Ambulation/Gait Assistance Details PT demo & instructed in technique with rollator walker   Ambulation Distance (Feet) 350 Feet  350' with rollator walker, 250' with cane   Assistive device Straight cane;Prosthesis;Rollator   Gait Pattern Decreased step length - right;Decreased stance time - left;Decreased stride length;Decreased hip/knee flexion - left;Decreased weight shift to left;Left steppage;Antalgic;Trunk flexed;Poor foot clearance - left;Step-through pattern   Ambulation Surface Indoor;Level   Stairs Yes   Stairs Assistance 5: Supervision   Stairs Assistance Details (indicate cue type and reason) cues on cane use with single rail   Stair Management Technique One rail Left;One rail Right;With cane;Step to pattern;Forwards  varied location of single rail   Number of Stairs 4  3 reps   Ramp 4: Min assist;5: Supervision  cane and prosthesis, rollator MinA    Ramp Details (indicate cue type and reason) demo & verbal cues on rollator walker use   Curb 4: Min assist;5: Supervision  cane and prosthesis, rollator walker CGA   Curb Details (indicate cue type and reason) demo & verbal cues on rollator walker use   Knee/Hip Exercises: Aerobic   Other Aerobic --   Prosthetics   Current prosthetic wear tolerance (days/week)  daily   Current prosthetic wear tolerance (#hours/day)  wearing prosthesis 5-6 hours 2x/day on non-dialysis day and same on dialysis days but removes to take nap after she gets home from dialysis.    Residual limb condition  intact  per pt report                  PT Short Term Goals - 10/23/15 1100    PT SHORT TERM GOAL #1   Title Patient demonstrates proper donning/doffing, verbalizes proper cleaning & weighing /wearing at dialysis. (Target Date: 10/09/2015)   Baseline 10/09/15 Patient demonstrates proper doffing, verbalizes proper cleaning and weighting/wearing at dialysis.   Time 1   Period Months   Status Achieved   PT SHORT TERM GOAL #2   Title Patient tolerates prosthesis wear >10 hrs total /day without skin issues or tenderness. (Target Date: 10/09/2015)   Baseline 10/09/15 Patient tolerates 8 hrs total/day without skin issues or tenderness with wear schedule set for 10 hrs/day.   Time 1   Period Months   Status Partially Met   PT SHORT TERM GOAL #3   Title Patient reaches 7" anteriorly, laterally, across midline and to floor with supervision. (Target Date: 10/09/2015)   Baseline Patient reaches at least 7" anteriorly, laterally, and across midline. Patient able to reach to floor with supervision   Time 1  Period Months   Status Achieved   PT SHORT TERM GOAL #4   Title Patient ambulates 200' with prosthesis & RW with supervision. (Target Date: 10/09/2015)   Baseline Achieved 09/20/2015 at 350' with prosthesis & RW with supervision.   Time 1   Period Months   Status Achieved   PT SHORT TERM GOAL #5   Title Patient negotiates ramps, curbs & stairs (2 rails) with RW & prosthesis with supervision. (Target Date: 10/09/2015)   Baseline Goal MET 10/09/15   Time 1   Period Months   Status Achieved           PT Long Term Goals - 10/16/15 1100    PT LONG TERM GOAL #1   Title Patient is independent with prosthetic care including using prosthesis at dialysis to enable safe utilization of prosthesis. (Target Date: 11/08/2015)   Time 2   Period Months   Status On-going   PT LONG TERM GOAL #2   Title Patient tolerates wear of prosthesis >90% of awake hours to enable function majority of her day. (Target  Date: 11/08/2015)   Time 2   Period Months   Status On-going   PT LONG TERM GOAL #3   Title Berg Balance >/= 45/56 to indicate lower fall risk. (Target Date: 11/08/2015)   Time 2   Period Months   Status On-going   PT LONG TERM GOAL #4   Title Pateint ambulates 300' with LRAD & prosthesis including ramps & curbs modified independent to enable community mobility. (Target Date: 11/08/2015)   Time 2   Period Months   Status On-going   PT LONG TERM GOAL #5   Title Patient ambulates household distances with LRAD & prosthesis modified independent. (Target Date: 11/08/2015)   Time 2   Period Months   Status On-going               Plan - 10/30/15 2310    Clinical Impression Statement Patient is improving mobility with prosthesis and is on target to meet LTGs on target at end of next week. Patient wanted to learn to use rollator walker and after PT instructed in proper use, she appears could use safely with further training for longer community mobility.    Rehab Potential Good   PT Frequency 2x / week   PT Duration Other (comment)  9 weeks (60 days)   PT Treatment/Interventions ADLs/Self Care Home Management;DME Instruction;Gait training;Stair training;Functional mobility training;Therapeutic activities;Therapeutic exercise;Balance training;Neuromuscular re-education;Patient/family education;Prosthetic Training   PT Next Visit Plan Continue with gait & balance towards LTG   PT Home Exercise Plan Consider gym membership - discussed twice with patient already. Offer programs.   Consulted and Agree with Plan of Care Patient      Patient will benefit from skilled therapeutic intervention in order to improve the following deficits and impairments:  Abnormal gait, Decreased activity tolerance, Decreased balance, Decreased endurance, Decreased knowledge of precautions, Decreased knowledge of use of DME, Decreased mobility, Decreased strength, Postural dysfunction, Prosthetic  Dependency  Visit Diagnosis: Unsteadiness on feet  Other abnormalities of gait and mobility  Muscle weakness (generalized)     Problem List Patient Active Problem List   Diagnosis Date Noted  . S/P unilateral BKA (below knee amputation) ([place]) 04/24/2015  . Foot ulcer with necrosis of muscle ([place]) 12/04/2014  . Leukocytosis, unspecified 04/22/2013  . HTN (hypertension), benign 04/18/2013  . Diabetes mellitus ([place]) 04/18/2013  . Syncope 04/18/2013  . Anemia 04/18/2013  . End stage renal disease ([place]) 09/02/2011  Jamey Reas PT, DPT 10/30/2015, 11:23 PM  [facility] [address] [place], [place], [address] Phone: [phone]   Fax:  [phone]  Name: LUDIVINA GUYMON MRN: [id number] Date of Birth: [birth year]

## 2015-10-31 DIAGNOSIS — N2581 Secondary hyperparathyroidism of renal origin: Secondary | ICD-10-CM | POA: Diagnosis not present

## 2015-10-31 DIAGNOSIS — N186 End stage renal disease: Secondary | ICD-10-CM | POA: Diagnosis not present

## 2015-10-31 DIAGNOSIS — D631 Anemia in chronic kidney disease: Secondary | ICD-10-CM | POA: Diagnosis not present

## 2015-10-31 DIAGNOSIS — E1129 Type 2 diabetes mellitus with other diabetic kidney complication: Secondary | ICD-10-CM | POA: Diagnosis not present

## 2015-10-31 DIAGNOSIS — D509 Iron deficiency anemia, unspecified: Secondary | ICD-10-CM | POA: Diagnosis not present

## 2015-11-02 DIAGNOSIS — E1129 Type 2 diabetes mellitus with other diabetic kidney complication: Secondary | ICD-10-CM | POA: Diagnosis not present

## 2015-11-02 DIAGNOSIS — N186 End stage renal disease: Secondary | ICD-10-CM | POA: Diagnosis not present

## 2015-11-02 DIAGNOSIS — D509 Iron deficiency anemia, unspecified: Secondary | ICD-10-CM | POA: Diagnosis not present

## 2015-11-02 DIAGNOSIS — D631 Anemia in chronic kidney disease: Secondary | ICD-10-CM | POA: Diagnosis not present

## 2015-11-02 DIAGNOSIS — N2581 Secondary hyperparathyroidism of renal origin: Secondary | ICD-10-CM | POA: Diagnosis not present

## 2015-11-05 DIAGNOSIS — N186 End stage renal disease: Secondary | ICD-10-CM | POA: Diagnosis not present

## 2015-11-05 DIAGNOSIS — E1129 Type 2 diabetes mellitus with other diabetic kidney complication: Secondary | ICD-10-CM | POA: Diagnosis not present

## 2015-11-05 DIAGNOSIS — N2581 Secondary hyperparathyroidism of renal origin: Secondary | ICD-10-CM | POA: Diagnosis not present

## 2015-11-05 DIAGNOSIS — D509 Iron deficiency anemia, unspecified: Secondary | ICD-10-CM | POA: Diagnosis not present

## 2015-11-05 DIAGNOSIS — D631 Anemia in chronic kidney disease: Secondary | ICD-10-CM | POA: Diagnosis not present

## 2015-11-06 ENCOUNTER — Encounter: Payer: Self-pay | Admitting: Physical Therapy

## 2015-11-06 ENCOUNTER — Ambulatory Visit: Payer: Medicare Other | Admitting: Physical Therapy

## 2015-11-06 DIAGNOSIS — R2689 Other abnormalities of gait and mobility: Secondary | ICD-10-CM | POA: Diagnosis not present

## 2015-11-06 DIAGNOSIS — R2681 Unsteadiness on feet: Secondary | ICD-10-CM

## 2015-11-06 DIAGNOSIS — M6281 Muscle weakness (generalized): Secondary | ICD-10-CM

## 2015-11-07 DIAGNOSIS — N2581 Secondary hyperparathyroidism of renal origin: Secondary | ICD-10-CM | POA: Diagnosis not present

## 2015-11-07 DIAGNOSIS — E1129 Type 2 diabetes mellitus with other diabetic kidney complication: Secondary | ICD-10-CM | POA: Diagnosis not present

## 2015-11-07 DIAGNOSIS — D509 Iron deficiency anemia, unspecified: Secondary | ICD-10-CM | POA: Diagnosis not present

## 2015-11-07 DIAGNOSIS — N186 End stage renal disease: Secondary | ICD-10-CM | POA: Diagnosis not present

## 2015-11-07 DIAGNOSIS — D631 Anemia in chronic kidney disease: Secondary | ICD-10-CM | POA: Diagnosis not present

## 2015-11-07 NOTE — Therapy (Signed)
[facility] [address] [place], [place], [address] Phone: [phone]   Fax:  [phone]  Physical Therapy Treatment  Patient Details  Name: Jackie [name] MRN: [id number] Date of Birth: 19-Jun-1935 Referring Provider: Meridee Score, MD  Encounter Date: 11/06/2015      PT End of Session - 11/06/15 1100    Visit Number 16   Number of Visits 18   Date for PT Re-Evaluation 11/08/15   Authorization Type Medicare G-code & progress report every 10th visit   PT Start Time 1015   PT Stop Time 1100   PT Time Calculation (min) 45 min   Equipment Utilized During Treatment Gait belt   Activity Tolerance Patient tolerated treatment well   Behavior During Therapy [facility] for tasks assessed/performed      Past Medical History  Diagnosis Date  . Hypertension   . Diabetes mellitus   . Thyroid disease     pt denies this  . Anemia   . Hyperlipidemia   . Arthritis   . Pneumonia   . End-stage renal disease on hemodialysis [facility])     dialysis T/Th/Sa    Past Surgical History  Procedure Laterality Date  . Arteriovenous graft placement Left     non function  . Eye surgery Bilateral     catarct  . Cesarean section    . Av fistula placement Right 01/30/2013    Procedure: ARTERIOVENOUS (AV) FISTULA CREATION;  Surgeon: Conrad Chiefland, MD;  Location: [place];  Service: Vascular;  Laterality: Right;  . Insertion of dialysis catheter Right 04/24/2013    Procedure: INSERTION OF DIALYSIS CATHETER;  Surgeon: Rosetta Posner, MD;  Location: [place];  Service: Vascular;  Laterality: Right;  . Fistula superficialization Right 04/24/2013    Procedure: FISTULA SUPERFICIALIZATION;  Surgeon: Rosetta Posner, MD;  Location: [place];  Service: Vascular;  Laterality: Right;  . Fistulogram Right 07/26/2013    Procedure: FISTULOGRAM;  Surgeon: Rosetta Posner, MD;  Location: [facility] CATH LAB;  Service: Cardiovascular;  Laterality: Right;  . Peripheral vascular  catheterization N/A 12/05/2014    Procedure: Abdominal Aortogram;  Surgeon: Conrad [place], MD;  Location: [place] CV LAB;  Service: Cardiovascular;  Laterality: N/A;  . Peripheral vascular catheterization N/A 03/19/2015    Procedure: Lower Extremity Angiography;  Surgeon: Serafina Mitchell, MD;  Location: [place] CV LAB;  Service: Cardiovascular;  Laterality: N/A;  . Peripheral vascular catheterization  03/19/2015    Procedure: Peripheral Vascular Intervention;  Surgeon: Serafina Mitchell, MD;  Location: [place] CV LAB;  Service: Cardiovascular;;  left sfa and popliteal stent  . Tonsillectomy    . Amputation Left 04/24/2015    Procedure: LEFT BELOW KNEE AMPUTATION;  Surgeon: Newt Minion, MD;  Location: [place];  Service: Orthopedics;  Laterality: Left;    There were no vitals filed for this visit.      Subjective Assessment - 11/06/15 1026    Subjective She is wearing prosthesis all awake hours. She is wearing to dialysis and no issues with weighing in or out.    Patient is accompained by: Family member   Pertinent History HTN, DM, thyroid disease, HLD, arthritis, ESRD, pneumonia, syncope   Patient Stated Goals She wants to prosthesis to walk in community & home.    Currently in Pain? No/denies            [facility] PT Assessment - 11/06/15 1015    Berg Balance Test  Sit to Stand Able to stand  independently using hands   Standing Unsupported Able to stand safely 2 minutes   Sitting with Back Unsupported but Feet Supported on Floor or Stool Able to sit safely and securely 2 minutes   Stand to Sit Sits safely with minimal use of hands   Transfers Able to transfer safely, minor use of hands   Standing Unsupported with Eyes Closed Able to stand 10 seconds with supervision   Standing Ubsupported with Feet Together Needs help to attain position but able to stand for 30 seconds with feet together   From Standing, Reach Forward with Outstretched Arm Can reach forward >5 cm safely (2")    From Standing Position, Pick up Object from Floor Able to pick up shoe, needs supervision   From Standing Position, Turn to Look Behind Over each Shoulder Turn sideways only but maintains balance   Turn 360 Degrees Needs close supervision or verbal cueing   Standing Unsupported, Alternately Place Feet on Step/Stool Able to complete >2 steps/needs minimal assist   Standing Unsupported, One Foot in [employer] balance while stepping or standing   Standing on One Leg Tries to lift leg/unable to hold 3 seconds but remains standing independently   Total Score 33                               PT Short Term Goals - 10/23/15 1100    PT SHORT TERM GOAL #1   Title Patient demonstrates proper donning/doffing, verbalizes proper cleaning & weighing /wearing at dialysis. (Target Date: 10/09/2015)   Baseline 10/09/15 Patient demonstrates proper doffing, verbalizes proper cleaning and weighting/wearing at dialysis.   Time 1   Period Months   Status Achieved   PT SHORT TERM GOAL #2   Title Patient tolerates prosthesis wear >10 hrs total /day without skin issues or tenderness. (Target Date: 10/09/2015)   Baseline 10/09/15 Patient tolerates 8 hrs total/day without skin issues or tenderness with wear schedule set for 10 hrs/day.   Time 1   Period Months   Status Partially Met   PT SHORT TERM GOAL #3   Title Patient reaches 7" anteriorly, laterally, across midline and to floor with supervision. (Target Date: 10/09/2015)   Baseline Patient reaches at least 7" anteriorly, laterally, and across midline. Patient able to reach to floor with supervision   Time 1   Period Months   Status Achieved   PT SHORT TERM GOAL #4   Title Patient ambulates 200' with prosthesis & RW with supervision. (Target Date: 10/09/2015)   Baseline Achieved 09/20/2015 at 350' with prosthesis & RW with supervision.   Time 1   Period Months   Status Achieved   PT SHORT TERM GOAL #5   Title Patient negotiates ramps,  curbs & stairs (2 rails) with RW & prosthesis with supervision. (Target Date: 10/09/2015)   Baseline Goal MET 10/09/15   Time 1   Period Months   Status Achieved           PT Long Term Goals - 11/06/15 1100    PT LONG TERM GOAL #1   Title Patient is independent with prosthetic care including using prosthesis at dialysis to enable safe utilization of prosthesis. (Target Date: 11/08/2015)   Baseline MET 11/06/2015   Time 2   Period Months   Status Achieved   PT LONG TERM GOAL #2   Title Patient tolerates wear of prosthesis >90% of awake  hours to enable function majority of her day. (Target Date: 11/08/2015)   Baseline MET 11/06/2015   Time 2   Period Months   Status Achieved   PT LONG TERM GOAL #3   Title Berg Balance >/= 45/56 to indicate lower fall risk. (Target Date: 11/08/2015)   Baseline Significant improvement from 23/56 to 33/56 but did not meet LTG. Patient understands recommendation for walker for community & cane only in home as fall prevention strategy.    Time 2   Period Months   Status Partially Met   PT LONG TERM GOAL #4   Title Pateint ambulates 300' with LRAD & prosthesis including ramps & curbs modified independent to enable community mobility. (Target Date: 11/08/2015)   Time 2   Period Months   Status On-going   PT LONG TERM GOAL #5   Title Patient ambulates household distances with LRAD & prosthesis modified independent. (Target Date: 11/08/2015)   Time 2   Period Months   Status On-going               Plan - 11/06/15 1300    Clinical Impression Statement Patient fully met 2 of 5 LTGs & partially MET 1. PT anticipates meeting other 2 LTGs with community mobility with rollator walker & household with cane.    Rehab Potential Good   PT Frequency 2x / week   PT Duration Other (comment)  9 weeks (60 days)   PT Treatment/Interventions ADLs/Self Care Home Management;DME Instruction;Gait training;Stair training;Functional mobility training;Therapeutic  activities;Therapeutic exercise;Balance training;Neuromuscular re-education;Patient/family education;Prosthetic Training   PT Next Visit Plan Assess remaining LTGs with community mobility with rollator walker & household with cane. Plan to discharge. Do exit [place] Consider gym membership - discussed twice with patient already. Offer programs.   Consulted and Agree with Plan of Care Patient      Patient will benefit from skilled therapeutic intervention in order to improve the following deficits and impairments:  Abnormal gait, Decreased activity tolerance, Decreased balance, Decreased endurance, Decreased knowledge of precautions, Decreased knowledge of use of DME, Decreased mobility, Decreased strength, Postural dysfunction, Prosthetic Dependency  Visit Diagnosis: Unsteadiness on feet  Other abnormalities of gait and mobility  Muscle weakness (generalized)     Problem List Patient Active Problem List   Diagnosis Date Noted  . S/P unilateral BKA (below knee amputation) ([place]) 04/24/2015  . Foot ulcer with necrosis of muscle ([place]) 12/04/2014  . Leukocytosis, unspecified 04/22/2013  . HTN (hypertension), benign 04/18/2013  . Diabetes mellitus ([place]) 04/18/2013  . Syncope 04/18/2013  . Anemia 04/18/2013  . End stage renal disease ([place]) 09/02/2011    Denham Mose PT, DPT 11/07/2015, 10:54 AM  [facility] [address] [place], [place], [address] Phone: [phone]   Fax:  [phone]  Name: Jackie [name] MRN: [id number] Date of Birth: 1934/12/24

## 2015-11-08 ENCOUNTER — Encounter: Payer: Self-pay | Admitting: Physical Therapy

## 2015-11-08 ENCOUNTER — Ambulatory Visit: Payer: Medicare Other | Admitting: Physical Therapy

## 2015-11-08 DIAGNOSIS — Z89512 Acquired absence of left leg below knee: Secondary | ICD-10-CM | POA: Diagnosis not present

## 2015-11-08 DIAGNOSIS — M6281 Muscle weakness (generalized): Secondary | ICD-10-CM

## 2015-11-08 DIAGNOSIS — R2681 Unsteadiness on feet: Secondary | ICD-10-CM

## 2015-11-08 DIAGNOSIS — R2689 Other abnormalities of gait and mobility: Secondary | ICD-10-CM

## 2015-11-09 DIAGNOSIS — E1129 Type 2 diabetes mellitus with other diabetic kidney complication: Secondary | ICD-10-CM | POA: Diagnosis not present

## 2015-11-09 DIAGNOSIS — N186 End stage renal disease: Secondary | ICD-10-CM | POA: Diagnosis not present

## 2015-11-09 DIAGNOSIS — D631 Anemia in chronic kidney disease: Secondary | ICD-10-CM | POA: Diagnosis not present

## 2015-11-09 DIAGNOSIS — N2581 Secondary hyperparathyroidism of renal origin: Secondary | ICD-10-CM | POA: Diagnosis not present

## 2015-11-09 DIAGNOSIS — D509 Iron deficiency anemia, unspecified: Secondary | ICD-10-CM | POA: Diagnosis not present

## 2015-11-10 NOTE — Therapy (Signed)
[facility] [address] [place], [place], [address] Phone: [phone]   Fax:  [phone]  Physical Therapy Treatment  Patient Details  Name: Jackie [name] MRN: [id number] Date of Birth: Dec 29, 1934 Referring Provider: Meridee Score, MD  Encounter Date: 11/08/2015   11/08/15 1319  PT Visits / Re-Eval  Visit Number 17  Number of Visits 18  Date for PT Re-Evaluation 11/08/15  Authorization  Authorization Type Medicare G-code & progress report every 10th visit  PT Time Calculation  PT Start Time 5038  PT Stop Time 8828 (discharge visit, ended early)  PT Time Calculation (min) 36 min  PT - End of Session  Equipment Utilized During Treatment Gait belt  Activity Tolerance Patient tolerated treatment well  Behavior During Therapy [facility] for tasks assessed/performed      Past Medical History  Diagnosis Date  . Hypertension   . Diabetes mellitus   . Thyroid disease     pt denies this  . Anemia   . Hyperlipidemia   . Arthritis   . Pneumonia   . End-stage renal disease on hemodialysis [facility])     dialysis T/Th/Sa    Past Surgical History  Procedure Laterality Date  . Arteriovenous graft placement Left     non function  . Eye surgery Bilateral     catarct  . Cesarean section    . Av fistula placement Right 01/30/2013    Procedure: ARTERIOVENOUS (AV) FISTULA CREATION;  Surgeon: Conrad Louis[place], MD;  Location: [place];  Service: Vascular;  Laterality: Right;  . Insertion of dialysis catheter Right 04/24/2013    Procedure: INSERTION OF DIALYSIS CATHETER;  Surgeon: Rosetta Posner, MD;  Location: [place];  Service: Vascular;  Laterality: Right;  . Fistula superficialization Right 04/24/2013    Procedure: FISTULA SUPERFICIALIZATION;  Surgeon: Rosetta Posner, MD;  Location: [place];  Service: Vascular;  Laterality: Right;  . Fistulogram Right 07/26/2013    Procedure: FISTULOGRAM;  Surgeon: Rosetta Posner, MD;  Location: [facility] CATH LAB;   Service: Cardiovascular;  Laterality: Right;  . Peripheral vascular catheterization N/A 12/05/2014    Procedure: Abdominal Aortogram;  Surgeon: Conrad [place], MD;  Location: [place] CV LAB;  Service: Cardiovascular;  Laterality: N/A;  . Peripheral vascular catheterization N/A 03/19/2015    Procedure: Lower Extremity Angiography;  Surgeon: Serafina Mitchell, MD;  Location: [place] CV LAB;  Service: Cardiovascular;  Laterality: N/A;  . Peripheral vascular catheterization  03/19/2015    Procedure: Peripheral Vascular Intervention;  Surgeon: Serafina Mitchell, MD;  Location: [place] CV LAB;  Service: Cardiovascular;;  left sfa and popliteal stent  . Tonsillectomy    . Amputation Left 04/24/2015    Procedure: LEFT BELOW KNEE AMPUTATION;  Surgeon: Newt Minion, MD;  Location: [place];  Service: Orthopedics;  Laterality: Left;    There were no vitals filed for this visit.     11/08/15 1318  Symptoms/Limitations  Subjective No new complaints. Went to Dr Jess Barters office prior to this PT appointment, did not see him (in surgery), saw PA instead. Recieved a good progress report. No falls or pain to report.  Pertinent History HTN, DM, thyroid disease, HLD, arthritis, ESRD, pneumonia, syncope  Patient Stated Goals She wants to prosthesis to walk in community & home.   Pain Assessment  Currently in Pain? No/denies      11/08/15 1321  Transfers  Transfers Sit to Stand;Stand to Sit  Sit to Stand 6: Modified independent (  Device/Increase time);With upper extremity assist;With armrests;From chair/3-in-1  Stand to Sit 6: Modified independent (Device/Increase time);With upper extremity assist;With armrests;To chair/3-in-1  Ambulation/Gait  Ambulation/Gait Yes  Ambulation/Gait Assistance 5: Supervision;6: Modified independent (Device/Increase time) (sup>mod I with rollator)  Ambulation/Gait Assistance Details intially needed cues for hand position on rollator brakes, no cues needed afterwards. sup>mod I  with straight cane on indoor level surfaces with obstacle negotiation/furniture negotiation included                  Ambulation Distance (Feet) 500 Feet (x1 rollator, 116 x1 with straight cane)  Assistive device Rollator;Prosthesis  Gait Pattern Step-through pattern;Decreased stance time - right;Decreased step length - left;Trunk flexed;Narrow base of support  Ambulation Surface Level;Indoor;Unlevel;Outdoor;Paved  Ramp 5: Supervision;6: Modified independent (Device)  Ramp Details (indicate cue type and reason) x2 outdoor ramps with rollator, progressed from supervision on 1st to mod I with 2cd  Curb 5: Supervision  Curb Details (indicate cue type and reason) x1 with rollator/prosthesis for safety only, pt able to recall sequence without cues and no physical assistance needed (outdoor curb)  Prosthetics  Current prosthetic wear tolerance (days/week)  daily  Current prosthetic wear tolerance (#hours/day)  wearing prosthesis 5-6 hours 2x/day on non-dialysis day and same on dialysis days but removes to take nap after she gets home from dialysis.   Residual limb condition  intact per pt report  Donning Prosthesis 6  Doffing Prosthesis 6            PT Short Term Goals - 10/23/15 1100    PT SHORT TERM GOAL #1   Title Patient demonstrates proper donning/doffing, verbalizes proper cleaning & weighing /wearing at dialysis. (Target Date: 10/09/2015)   Baseline 10/09/15 Patient demonstrates proper doffing, verbalizes proper cleaning and weighting/wearing at dialysis.   Time 1   Period Months   Status Achieved   PT SHORT TERM GOAL #2   Title Patient tolerates prosthesis wear >10 hrs total /day without skin issues or tenderness. (Target Date: 10/09/2015)   Baseline 10/09/15 Patient tolerates 8 hrs total/day without skin issues or tenderness with wear schedule set for 10 hrs/day.   Time 1   Period Months   Status Partially Met   PT SHORT TERM GOAL #3   Title Patient reaches 7" anteriorly,  laterally, across midline and to floor with supervision. (Target Date: 10/09/2015)   Baseline Patient reaches at least 7" anteriorly, laterally, and across midline. Patient able to reach to floor with supervision   Time 1   Period Months   Status Achieved   PT SHORT TERM GOAL #4   Title Patient ambulates 200' with prosthesis & RW with supervision. (Target Date: 10/09/2015)   Baseline Achieved 09/20/2015 at 350' with prosthesis & RW with supervision.   Time 1   Period Months   Status Achieved   PT SHORT TERM GOAL #5   Title Patient negotiates ramps, curbs & stairs (2 rails) with RW & prosthesis with supervision. (Target Date: 10/09/2015)   Baseline Goal MET 10/09/15   Time 1   Period Months   Status Achieved           PT Long Term Goals - 11/08/15 1320    PT LONG TERM GOAL #1   Title Patient is independent with prosthetic care including using prosthesis at dialysis to enable safe utilization of prosthesis. (Target Date: 11/08/2015)   Baseline MET 11/06/2015   Status Achieved   PT LONG TERM GOAL #2   Title Patient tolerates wear of prosthesis >  90% of awake hours to enable function majority of her day. (Target Date: 11/08/2015)   Baseline MET 11/06/2015   Status Achieved   PT LONG TERM GOAL #3   Title Berg Balance >/= 45/56 to indicate lower fall risk. (Target Date: 11/08/2015)   Baseline 11/06/15: Significant improvement from 23/56 to 33/56 but did not meet LTG. Patient understands recommendation for walker for community & cane only in home as fall prevention strategy.    Status Partially Met   PT LONG TERM GOAL #4   Title Pateint ambulates 300' with LRAD & prosthesis including ramps & curbs modified independent to enable community mobility. (Target Date: 11/08/2015)   Time --   Period --   Status Achieved   PT LONG TERM GOAL #5   Title Patient ambulates household distances with LRAD & prosthesis modified independent. (Target Date: 11/08/2015)   Time --   Period --   Status Achieved         11/08/15 1320  Plan  Clinical Impression Statement Pt has met her remaining LTGs and is agreeable to discharge today. disc  Pt will benefit from skilled therapeutic intervention in order to improve on the following deficits Abnormal gait;Decreased activity tolerance;Decreased balance;Decreased endurance;Decreased knowledge of precautions;Decreased knowledge of use of DME;Decreased mobility;Decreased strength;Postural dysfunction;Prosthetic Dependency  Rehab Potential Good  PT Frequency 2x / week  PT Duration Other (comment) (9 weeks (60 days))  PT Treatment/Interventions ADLs/Self Care Home Management;DME Instruction;Gait training;Stair training;Functional mobility training;Therapeutic activities;Therapeutic exercise;Balance training;Neuromuscular re-education;Patient/family education;Prosthetic Training  PT Next Visit Plan discharge per PT plan of care  PT Home Exercise Plan Consider gym membership - discussed twice with patient already. Offer programs.  Consulted and Agree with Plan of Care Patient       Patient will benefit from skilled therapeutic intervention in order to improve the following deficits and impairments:  Abnormal gait, Decreased activity tolerance, Decreased balance, Decreased endurance, Decreased knowledge of precautions, Decreased knowledge of use of DME, Decreased mobility, Decreased strength, Postural dysfunction, Prosthetic Dependency  Visit Diagnosis: Unsteadiness on feet  Other abnormalities of gait and mobility  Muscle weakness (generalized)              Problem List Patient Active Problem List   Diagnosis Date Noted  . S/P unilateral BKA (below knee amputation) ([place]) 04/24/2015  . Foot ulcer with necrosis of muscle ([place]) 12/04/2014  . Leukocytosis, unspecified 04/22/2013  . HTN (hypertension), benign 04/18/2013  . Diabetes mellitus ([place]) 04/18/2013  . Syncope 04/18/2013  . Anemia 04/18/2013  . End stage renal disease ([place]) 09/02/2011     Willow Ora, PTA, [facility] [address] [place], [place] [address] [phone] 15-Nov-2015, 12:00 AM   Name: Jackie [name] MRN: [id number] Date of Birth: 02/04/1935      G-Codes - November 15, 2015 1234    Functional Assessment Tool Used Patient is wearing prosthesis all awake hours. She is wearing to dialysis but removes while on dialysis for comfort. She verbalizes proper weigh-in & weigh-out modifications with prosthesis. She is independent in prosthetic care.    Functional Limitation Self care   Self Care Goal Status [phone]) At least 1 percent but less than 20 percent impaired, limited or restricted   Self Care Discharge Status [phone]) At least 1 percent but less than 20 percent impaired, limited or restricted      PHYSICAL THERAPY DISCHARGE SUMMARY  Visits from Start of Care: 17 Current functional level related to goals / functional outcomes: See above  Remaining deficits: See above   Education / Equipment: Prosthetic care  Plan: Patient agrees to discharge.  Patient goals were met. Patient is being discharged due to meeting the stated rehab goals.  ?????        Jamey Reas, PT, DPT PT Specializing in [place] 11/11/2015 12:38 PM Phone:  [phone]  Fax:  [phone] [facility] [address] [place], [place] [address]

## 2015-11-12 DIAGNOSIS — D631 Anemia in chronic kidney disease: Secondary | ICD-10-CM | POA: Diagnosis not present

## 2015-11-12 DIAGNOSIS — E1129 Type 2 diabetes mellitus with other diabetic kidney complication: Secondary | ICD-10-CM | POA: Diagnosis not present

## 2015-11-12 DIAGNOSIS — N186 End stage renal disease: Secondary | ICD-10-CM | POA: Diagnosis not present

## 2015-11-12 DIAGNOSIS — D509 Iron deficiency anemia, unspecified: Secondary | ICD-10-CM | POA: Diagnosis not present

## 2015-11-12 DIAGNOSIS — N2581 Secondary hyperparathyroidism of renal origin: Secondary | ICD-10-CM | POA: Diagnosis not present

## 2015-11-13 DIAGNOSIS — I871 Compression of vein: Secondary | ICD-10-CM | POA: Diagnosis not present

## 2015-11-13 DIAGNOSIS — Z992 Dependence on renal dialysis: Secondary | ICD-10-CM | POA: Diagnosis not present

## 2015-11-13 DIAGNOSIS — N186 End stage renal disease: Secondary | ICD-10-CM | POA: Diagnosis not present

## 2015-11-13 DIAGNOSIS — T82858D Stenosis of vascular prosthetic devices, implants and grafts, subsequent encounter: Secondary | ICD-10-CM | POA: Diagnosis not present

## 2015-11-14 DIAGNOSIS — E1129 Type 2 diabetes mellitus with other diabetic kidney complication: Secondary | ICD-10-CM | POA: Diagnosis not present

## 2015-11-14 DIAGNOSIS — N2581 Secondary hyperparathyroidism of renal origin: Secondary | ICD-10-CM | POA: Diagnosis not present

## 2015-11-14 DIAGNOSIS — N186 End stage renal disease: Secondary | ICD-10-CM | POA: Diagnosis not present

## 2015-11-14 DIAGNOSIS — D509 Iron deficiency anemia, unspecified: Secondary | ICD-10-CM | POA: Diagnosis not present

## 2015-11-14 DIAGNOSIS — D631 Anemia in chronic kidney disease: Secondary | ICD-10-CM | POA: Diagnosis not present

## 2015-11-16 DIAGNOSIS — D509 Iron deficiency anemia, unspecified: Secondary | ICD-10-CM | POA: Diagnosis not present

## 2015-11-16 DIAGNOSIS — N2581 Secondary hyperparathyroidism of renal origin: Secondary | ICD-10-CM | POA: Diagnosis not present

## 2015-11-16 DIAGNOSIS — N186 End stage renal disease: Secondary | ICD-10-CM | POA: Diagnosis not present

## 2015-11-16 DIAGNOSIS — D631 Anemia in chronic kidney disease: Secondary | ICD-10-CM | POA: Diagnosis not present

## 2015-11-16 DIAGNOSIS — E1129 Type 2 diabetes mellitus with other diabetic kidney complication: Secondary | ICD-10-CM | POA: Diagnosis not present

## 2015-11-17 DIAGNOSIS — N186 End stage renal disease: Secondary | ICD-10-CM | POA: Diagnosis not present

## 2015-11-17 DIAGNOSIS — E1129 Type 2 diabetes mellitus with other diabetic kidney complication: Secondary | ICD-10-CM | POA: Diagnosis not present

## 2015-11-17 DIAGNOSIS — Z992 Dependence on renal dialysis: Secondary | ICD-10-CM | POA: Diagnosis not present

## 2015-11-19 DIAGNOSIS — E1129 Type 2 diabetes mellitus with other diabetic kidney complication: Secondary | ICD-10-CM | POA: Diagnosis not present

## 2015-11-19 DIAGNOSIS — N186 End stage renal disease: Secondary | ICD-10-CM | POA: Diagnosis not present

## 2015-11-19 DIAGNOSIS — D509 Iron deficiency anemia, unspecified: Secondary | ICD-10-CM | POA: Diagnosis not present

## 2015-11-19 DIAGNOSIS — N2581 Secondary hyperparathyroidism of renal origin: Secondary | ICD-10-CM | POA: Diagnosis not present

## 2015-11-21 DIAGNOSIS — D509 Iron deficiency anemia, unspecified: Secondary | ICD-10-CM | POA: Diagnosis not present

## 2015-11-21 DIAGNOSIS — N2581 Secondary hyperparathyroidism of renal origin: Secondary | ICD-10-CM | POA: Diagnosis not present

## 2015-11-21 DIAGNOSIS — N186 End stage renal disease: Secondary | ICD-10-CM | POA: Diagnosis not present

## 2015-11-21 DIAGNOSIS — E1129 Type 2 diabetes mellitus with other diabetic kidney complication: Secondary | ICD-10-CM | POA: Diagnosis not present

## 2015-11-23 DIAGNOSIS — N186 End stage renal disease: Secondary | ICD-10-CM | POA: Diagnosis not present

## 2015-11-23 DIAGNOSIS — D509 Iron deficiency anemia, unspecified: Secondary | ICD-10-CM | POA: Diagnosis not present

## 2015-11-23 DIAGNOSIS — E1129 Type 2 diabetes mellitus with other diabetic kidney complication: Secondary | ICD-10-CM | POA: Diagnosis not present

## 2015-11-23 DIAGNOSIS — N2581 Secondary hyperparathyroidism of renal origin: Secondary | ICD-10-CM | POA: Diagnosis not present

## 2015-11-26 DIAGNOSIS — N186 End stage renal disease: Secondary | ICD-10-CM | POA: Diagnosis not present

## 2015-11-26 DIAGNOSIS — E1129 Type 2 diabetes mellitus with other diabetic kidney complication: Secondary | ICD-10-CM | POA: Diagnosis not present

## 2015-11-26 DIAGNOSIS — D509 Iron deficiency anemia, unspecified: Secondary | ICD-10-CM | POA: Diagnosis not present

## 2015-11-26 DIAGNOSIS — N2581 Secondary hyperparathyroidism of renal origin: Secondary | ICD-10-CM | POA: Diagnosis not present

## 2015-11-28 DIAGNOSIS — E1129 Type 2 diabetes mellitus with other diabetic kidney complication: Secondary | ICD-10-CM | POA: Diagnosis not present

## 2015-11-28 DIAGNOSIS — N186 End stage renal disease: Secondary | ICD-10-CM | POA: Diagnosis not present

## 2015-11-28 DIAGNOSIS — D509 Iron deficiency anemia, unspecified: Secondary | ICD-10-CM | POA: Diagnosis not present

## 2015-11-28 DIAGNOSIS — N2581 Secondary hyperparathyroidism of renal origin: Secondary | ICD-10-CM | POA: Diagnosis not present

## 2015-11-30 DIAGNOSIS — N2581 Secondary hyperparathyroidism of renal origin: Secondary | ICD-10-CM | POA: Diagnosis not present

## 2015-11-30 DIAGNOSIS — E1129 Type 2 diabetes mellitus with other diabetic kidney complication: Secondary | ICD-10-CM | POA: Diagnosis not present

## 2015-11-30 DIAGNOSIS — D509 Iron deficiency anemia, unspecified: Secondary | ICD-10-CM | POA: Diagnosis not present

## 2015-11-30 DIAGNOSIS — N186 End stage renal disease: Secondary | ICD-10-CM | POA: Diagnosis not present

## 2015-12-03 DIAGNOSIS — D509 Iron deficiency anemia, unspecified: Secondary | ICD-10-CM | POA: Diagnosis not present

## 2015-12-03 DIAGNOSIS — N2581 Secondary hyperparathyroidism of renal origin: Secondary | ICD-10-CM | POA: Diagnosis not present

## 2015-12-03 DIAGNOSIS — E1129 Type 2 diabetes mellitus with other diabetic kidney complication: Secondary | ICD-10-CM | POA: Diagnosis not present

## 2015-12-03 DIAGNOSIS — N186 End stage renal disease: Secondary | ICD-10-CM | POA: Diagnosis not present

## 2015-12-05 DIAGNOSIS — D509 Iron deficiency anemia, unspecified: Secondary | ICD-10-CM | POA: Diagnosis not present

## 2015-12-05 DIAGNOSIS — N2581 Secondary hyperparathyroidism of renal origin: Secondary | ICD-10-CM | POA: Diagnosis not present

## 2015-12-05 DIAGNOSIS — N186 End stage renal disease: Secondary | ICD-10-CM | POA: Diagnosis not present

## 2015-12-05 DIAGNOSIS — E1129 Type 2 diabetes mellitus with other diabetic kidney complication: Secondary | ICD-10-CM | POA: Diagnosis not present

## 2015-12-07 DIAGNOSIS — D509 Iron deficiency anemia, unspecified: Secondary | ICD-10-CM | POA: Diagnosis not present

## 2015-12-07 DIAGNOSIS — N2581 Secondary hyperparathyroidism of renal origin: Secondary | ICD-10-CM | POA: Diagnosis not present

## 2015-12-07 DIAGNOSIS — N186 End stage renal disease: Secondary | ICD-10-CM | POA: Diagnosis not present

## 2015-12-07 DIAGNOSIS — E1129 Type 2 diabetes mellitus with other diabetic kidney complication: Secondary | ICD-10-CM | POA: Diagnosis not present

## 2015-12-10 DIAGNOSIS — N186 End stage renal disease: Secondary | ICD-10-CM | POA: Diagnosis not present

## 2015-12-10 DIAGNOSIS — D509 Iron deficiency anemia, unspecified: Secondary | ICD-10-CM | POA: Diagnosis not present

## 2015-12-10 DIAGNOSIS — N2581 Secondary hyperparathyroidism of renal origin: Secondary | ICD-10-CM | POA: Diagnosis not present

## 2015-12-10 DIAGNOSIS — E1129 Type 2 diabetes mellitus with other diabetic kidney complication: Secondary | ICD-10-CM | POA: Diagnosis not present

## 2015-12-12 DIAGNOSIS — E1129 Type 2 diabetes mellitus with other diabetic kidney complication: Secondary | ICD-10-CM | POA: Diagnosis not present

## 2015-12-12 DIAGNOSIS — D509 Iron deficiency anemia, unspecified: Secondary | ICD-10-CM | POA: Diagnosis not present

## 2015-12-12 DIAGNOSIS — N2581 Secondary hyperparathyroidism of renal origin: Secondary | ICD-10-CM | POA: Diagnosis not present

## 2015-12-12 DIAGNOSIS — N186 End stage renal disease: Secondary | ICD-10-CM | POA: Diagnosis not present

## 2015-12-14 DIAGNOSIS — N186 End stage renal disease: Secondary | ICD-10-CM | POA: Diagnosis not present

## 2015-12-14 DIAGNOSIS — E1129 Type 2 diabetes mellitus with other diabetic kidney complication: Secondary | ICD-10-CM | POA: Diagnosis not present

## 2015-12-14 DIAGNOSIS — D509 Iron deficiency anemia, unspecified: Secondary | ICD-10-CM | POA: Diagnosis not present

## 2015-12-14 DIAGNOSIS — N2581 Secondary hyperparathyroidism of renal origin: Secondary | ICD-10-CM | POA: Diagnosis not present

## 2015-12-17 DIAGNOSIS — N186 End stage renal disease: Secondary | ICD-10-CM | POA: Diagnosis not present

## 2015-12-17 DIAGNOSIS — N2581 Secondary hyperparathyroidism of renal origin: Secondary | ICD-10-CM | POA: Diagnosis not present

## 2015-12-17 DIAGNOSIS — E1129 Type 2 diabetes mellitus with other diabetic kidney complication: Secondary | ICD-10-CM | POA: Diagnosis not present

## 2015-12-17 DIAGNOSIS — D509 Iron deficiency anemia, unspecified: Secondary | ICD-10-CM | POA: Diagnosis not present

## 2015-12-18 DIAGNOSIS — Z992 Dependence on renal dialysis: Secondary | ICD-10-CM | POA: Diagnosis not present

## 2015-12-18 DIAGNOSIS — E1129 Type 2 diabetes mellitus with other diabetic kidney complication: Secondary | ICD-10-CM | POA: Diagnosis not present

## 2015-12-18 DIAGNOSIS — N186 End stage renal disease: Secondary | ICD-10-CM | POA: Diagnosis not present

## 2015-12-19 DIAGNOSIS — N2581 Secondary hyperparathyroidism of renal origin: Secondary | ICD-10-CM | POA: Diagnosis not present

## 2015-12-19 DIAGNOSIS — N186 End stage renal disease: Secondary | ICD-10-CM | POA: Diagnosis not present

## 2015-12-19 DIAGNOSIS — D509 Iron deficiency anemia, unspecified: Secondary | ICD-10-CM | POA: Diagnosis not present

## 2015-12-19 DIAGNOSIS — E1129 Type 2 diabetes mellitus with other diabetic kidney complication: Secondary | ICD-10-CM | POA: Diagnosis not present

## 2015-12-21 DIAGNOSIS — E1129 Type 2 diabetes mellitus with other diabetic kidney complication: Secondary | ICD-10-CM | POA: Diagnosis not present

## 2015-12-21 DIAGNOSIS — D509 Iron deficiency anemia, unspecified: Secondary | ICD-10-CM | POA: Diagnosis not present

## 2015-12-21 DIAGNOSIS — N2581 Secondary hyperparathyroidism of renal origin: Secondary | ICD-10-CM | POA: Diagnosis not present

## 2015-12-21 DIAGNOSIS — N186 End stage renal disease: Secondary | ICD-10-CM | POA: Diagnosis not present

## 2015-12-24 DIAGNOSIS — E1129 Type 2 diabetes mellitus with other diabetic kidney complication: Secondary | ICD-10-CM | POA: Diagnosis not present

## 2015-12-24 DIAGNOSIS — D509 Iron deficiency anemia, unspecified: Secondary | ICD-10-CM | POA: Diagnosis not present

## 2015-12-24 DIAGNOSIS — N186 End stage renal disease: Secondary | ICD-10-CM | POA: Diagnosis not present

## 2015-12-24 DIAGNOSIS — N2581 Secondary hyperparathyroidism of renal origin: Secondary | ICD-10-CM | POA: Diagnosis not present

## 2015-12-25 DIAGNOSIS — I871 Compression of vein: Secondary | ICD-10-CM | POA: Diagnosis not present

## 2015-12-25 DIAGNOSIS — Z992 Dependence on renal dialysis: Secondary | ICD-10-CM | POA: Diagnosis not present

## 2015-12-25 DIAGNOSIS — N186 End stage renal disease: Secondary | ICD-10-CM | POA: Diagnosis not present

## 2015-12-25 DIAGNOSIS — T82858D Stenosis of vascular prosthetic devices, implants and grafts, subsequent encounter: Secondary | ICD-10-CM | POA: Diagnosis not present

## 2015-12-26 DIAGNOSIS — D509 Iron deficiency anemia, unspecified: Secondary | ICD-10-CM | POA: Diagnosis not present

## 2015-12-26 DIAGNOSIS — N2581 Secondary hyperparathyroidism of renal origin: Secondary | ICD-10-CM | POA: Diagnosis not present

## 2015-12-26 DIAGNOSIS — N186 End stage renal disease: Secondary | ICD-10-CM | POA: Diagnosis not present

## 2015-12-26 DIAGNOSIS — E1129 Type 2 diabetes mellitus with other diabetic kidney complication: Secondary | ICD-10-CM | POA: Diagnosis not present

## 2015-12-28 DIAGNOSIS — E1129 Type 2 diabetes mellitus with other diabetic kidney complication: Secondary | ICD-10-CM | POA: Diagnosis not present

## 2015-12-28 DIAGNOSIS — D509 Iron deficiency anemia, unspecified: Secondary | ICD-10-CM | POA: Diagnosis not present

## 2015-12-28 DIAGNOSIS — N2581 Secondary hyperparathyroidism of renal origin: Secondary | ICD-10-CM | POA: Diagnosis not present

## 2015-12-28 DIAGNOSIS — N186 End stage renal disease: Secondary | ICD-10-CM | POA: Diagnosis not present

## 2015-12-31 DIAGNOSIS — D509 Iron deficiency anemia, unspecified: Secondary | ICD-10-CM | POA: Diagnosis not present

## 2015-12-31 DIAGNOSIS — N2581 Secondary hyperparathyroidism of renal origin: Secondary | ICD-10-CM | POA: Diagnosis not present

## 2015-12-31 DIAGNOSIS — N186 End stage renal disease: Secondary | ICD-10-CM | POA: Diagnosis not present

## 2015-12-31 DIAGNOSIS — E1129 Type 2 diabetes mellitus with other diabetic kidney complication: Secondary | ICD-10-CM | POA: Diagnosis not present

## 2016-01-02 DIAGNOSIS — N186 End stage renal disease: Secondary | ICD-10-CM | POA: Diagnosis not present

## 2016-01-02 DIAGNOSIS — N2581 Secondary hyperparathyroidism of renal origin: Secondary | ICD-10-CM | POA: Diagnosis not present

## 2016-01-02 DIAGNOSIS — D509 Iron deficiency anemia, unspecified: Secondary | ICD-10-CM | POA: Diagnosis not present

## 2016-01-02 DIAGNOSIS — E1129 Type 2 diabetes mellitus with other diabetic kidney complication: Secondary | ICD-10-CM | POA: Diagnosis not present

## 2016-01-04 DIAGNOSIS — N2581 Secondary hyperparathyroidism of renal origin: Secondary | ICD-10-CM | POA: Diagnosis not present

## 2016-01-04 DIAGNOSIS — N186 End stage renal disease: Secondary | ICD-10-CM | POA: Diagnosis not present

## 2016-01-04 DIAGNOSIS — E1129 Type 2 diabetes mellitus with other diabetic kidney complication: Secondary | ICD-10-CM | POA: Diagnosis not present

## 2016-01-04 DIAGNOSIS — D509 Iron deficiency anemia, unspecified: Secondary | ICD-10-CM | POA: Diagnosis not present

## 2016-01-07 DIAGNOSIS — D509 Iron deficiency anemia, unspecified: Secondary | ICD-10-CM | POA: Diagnosis not present

## 2016-01-07 DIAGNOSIS — N2581 Secondary hyperparathyroidism of renal origin: Secondary | ICD-10-CM | POA: Diagnosis not present

## 2016-01-07 DIAGNOSIS — E1129 Type 2 diabetes mellitus with other diabetic kidney complication: Secondary | ICD-10-CM | POA: Diagnosis not present

## 2016-01-07 DIAGNOSIS — N186 End stage renal disease: Secondary | ICD-10-CM | POA: Diagnosis not present

## 2016-01-09 DIAGNOSIS — N186 End stage renal disease: Secondary | ICD-10-CM | POA: Diagnosis not present

## 2016-01-09 DIAGNOSIS — N2581 Secondary hyperparathyroidism of renal origin: Secondary | ICD-10-CM | POA: Diagnosis not present

## 2016-01-09 DIAGNOSIS — E1129 Type 2 diabetes mellitus with other diabetic kidney complication: Secondary | ICD-10-CM | POA: Diagnosis not present

## 2016-01-09 DIAGNOSIS — D509 Iron deficiency anemia, unspecified: Secondary | ICD-10-CM | POA: Diagnosis not present

## 2016-01-11 DIAGNOSIS — E1129 Type 2 diabetes mellitus with other diabetic kidney complication: Secondary | ICD-10-CM | POA: Diagnosis not present

## 2016-01-11 DIAGNOSIS — D509 Iron deficiency anemia, unspecified: Secondary | ICD-10-CM | POA: Diagnosis not present

## 2016-01-11 DIAGNOSIS — N2581 Secondary hyperparathyroidism of renal origin: Secondary | ICD-10-CM | POA: Diagnosis not present

## 2016-01-11 DIAGNOSIS — N186 End stage renal disease: Secondary | ICD-10-CM | POA: Diagnosis not present

## 2016-01-14 DIAGNOSIS — E1129 Type 2 diabetes mellitus with other diabetic kidney complication: Secondary | ICD-10-CM | POA: Diagnosis not present

## 2016-01-14 DIAGNOSIS — N186 End stage renal disease: Secondary | ICD-10-CM | POA: Diagnosis not present

## 2016-01-14 DIAGNOSIS — N2581 Secondary hyperparathyroidism of renal origin: Secondary | ICD-10-CM | POA: Diagnosis not present

## 2016-01-14 DIAGNOSIS — D509 Iron deficiency anemia, unspecified: Secondary | ICD-10-CM | POA: Diagnosis not present

## 2016-01-16 DIAGNOSIS — E1129 Type 2 diabetes mellitus with other diabetic kidney complication: Secondary | ICD-10-CM | POA: Diagnosis not present

## 2016-01-16 DIAGNOSIS — N186 End stage renal disease: Secondary | ICD-10-CM | POA: Diagnosis not present

## 2016-01-16 DIAGNOSIS — N2581 Secondary hyperparathyroidism of renal origin: Secondary | ICD-10-CM | POA: Diagnosis not present

## 2016-01-16 DIAGNOSIS — D509 Iron deficiency anemia, unspecified: Secondary | ICD-10-CM | POA: Diagnosis not present

## 2016-01-17 DIAGNOSIS — Z992 Dependence on renal dialysis: Secondary | ICD-10-CM | POA: Diagnosis not present

## 2016-01-17 DIAGNOSIS — E1129 Type 2 diabetes mellitus with other diabetic kidney complication: Secondary | ICD-10-CM | POA: Diagnosis not present

## 2016-01-17 DIAGNOSIS — N186 End stage renal disease: Secondary | ICD-10-CM | POA: Diagnosis not present

## 2016-01-18 DIAGNOSIS — N186 End stage renal disease: Secondary | ICD-10-CM | POA: Diagnosis not present

## 2016-01-18 DIAGNOSIS — E1129 Type 2 diabetes mellitus with other diabetic kidney complication: Secondary | ICD-10-CM | POA: Diagnosis not present

## 2016-01-18 DIAGNOSIS — D509 Iron deficiency anemia, unspecified: Secondary | ICD-10-CM | POA: Diagnosis not present

## 2016-01-18 DIAGNOSIS — D631 Anemia in chronic kidney disease: Secondary | ICD-10-CM | POA: Diagnosis not present

## 2016-01-18 DIAGNOSIS — N2581 Secondary hyperparathyroidism of renal origin: Secondary | ICD-10-CM | POA: Diagnosis not present

## 2016-01-21 DIAGNOSIS — D631 Anemia in chronic kidney disease: Secondary | ICD-10-CM | POA: Diagnosis not present

## 2016-01-21 DIAGNOSIS — N2581 Secondary hyperparathyroidism of renal origin: Secondary | ICD-10-CM | POA: Diagnosis not present

## 2016-01-21 DIAGNOSIS — E1129 Type 2 diabetes mellitus with other diabetic kidney complication: Secondary | ICD-10-CM | POA: Diagnosis not present

## 2016-01-21 DIAGNOSIS — N186 End stage renal disease: Secondary | ICD-10-CM | POA: Diagnosis not present

## 2016-01-21 DIAGNOSIS — D509 Iron deficiency anemia, unspecified: Secondary | ICD-10-CM | POA: Diagnosis not present

## 2016-01-23 DIAGNOSIS — D631 Anemia in chronic kidney disease: Secondary | ICD-10-CM | POA: Diagnosis not present

## 2016-01-23 DIAGNOSIS — D509 Iron deficiency anemia, unspecified: Secondary | ICD-10-CM | POA: Diagnosis not present

## 2016-01-23 DIAGNOSIS — N2581 Secondary hyperparathyroidism of renal origin: Secondary | ICD-10-CM | POA: Diagnosis not present

## 2016-01-23 DIAGNOSIS — E1129 Type 2 diabetes mellitus with other diabetic kidney complication: Secondary | ICD-10-CM | POA: Diagnosis not present

## 2016-01-23 DIAGNOSIS — N186 End stage renal disease: Secondary | ICD-10-CM | POA: Diagnosis not present

## 2016-01-25 DIAGNOSIS — N2581 Secondary hyperparathyroidism of renal origin: Secondary | ICD-10-CM | POA: Diagnosis not present

## 2016-01-25 DIAGNOSIS — N186 End stage renal disease: Secondary | ICD-10-CM | POA: Diagnosis not present

## 2016-01-25 DIAGNOSIS — D509 Iron deficiency anemia, unspecified: Secondary | ICD-10-CM | POA: Diagnosis not present

## 2016-01-25 DIAGNOSIS — D631 Anemia in chronic kidney disease: Secondary | ICD-10-CM | POA: Diagnosis not present

## 2016-01-25 DIAGNOSIS — E1129 Type 2 diabetes mellitus with other diabetic kidney complication: Secondary | ICD-10-CM | POA: Diagnosis not present

## 2016-01-28 DIAGNOSIS — D631 Anemia in chronic kidney disease: Secondary | ICD-10-CM | POA: Diagnosis not present

## 2016-01-28 DIAGNOSIS — D509 Iron deficiency anemia, unspecified: Secondary | ICD-10-CM | POA: Diagnosis not present

## 2016-01-28 DIAGNOSIS — N186 End stage renal disease: Secondary | ICD-10-CM | POA: Diagnosis not present

## 2016-01-28 DIAGNOSIS — N2581 Secondary hyperparathyroidism of renal origin: Secondary | ICD-10-CM | POA: Diagnosis not present

## 2016-01-28 DIAGNOSIS — E1129 Type 2 diabetes mellitus with other diabetic kidney complication: Secondary | ICD-10-CM | POA: Diagnosis not present

## 2016-01-30 DIAGNOSIS — D509 Iron deficiency anemia, unspecified: Secondary | ICD-10-CM | POA: Diagnosis not present

## 2016-01-30 DIAGNOSIS — N186 End stage renal disease: Secondary | ICD-10-CM | POA: Diagnosis not present

## 2016-01-30 DIAGNOSIS — E1129 Type 2 diabetes mellitus with other diabetic kidney complication: Secondary | ICD-10-CM | POA: Diagnosis not present

## 2016-01-30 DIAGNOSIS — N2581 Secondary hyperparathyroidism of renal origin: Secondary | ICD-10-CM | POA: Diagnosis not present

## 2016-01-30 DIAGNOSIS — D631 Anemia in chronic kidney disease: Secondary | ICD-10-CM | POA: Diagnosis not present

## 2016-02-01 DIAGNOSIS — E1129 Type 2 diabetes mellitus with other diabetic kidney complication: Secondary | ICD-10-CM | POA: Diagnosis not present

## 2016-02-01 DIAGNOSIS — N186 End stage renal disease: Secondary | ICD-10-CM | POA: Diagnosis not present

## 2016-02-01 DIAGNOSIS — D509 Iron deficiency anemia, unspecified: Secondary | ICD-10-CM | POA: Diagnosis not present

## 2016-02-01 DIAGNOSIS — N2581 Secondary hyperparathyroidism of renal origin: Secondary | ICD-10-CM | POA: Diagnosis not present

## 2016-02-01 DIAGNOSIS — D631 Anemia in chronic kidney disease: Secondary | ICD-10-CM | POA: Diagnosis not present

## 2016-02-04 DIAGNOSIS — N186 End stage renal disease: Secondary | ICD-10-CM | POA: Diagnosis not present

## 2016-02-04 DIAGNOSIS — D509 Iron deficiency anemia, unspecified: Secondary | ICD-10-CM | POA: Diagnosis not present

## 2016-02-04 DIAGNOSIS — E1129 Type 2 diabetes mellitus with other diabetic kidney complication: Secondary | ICD-10-CM | POA: Diagnosis not present

## 2016-02-04 DIAGNOSIS — N2581 Secondary hyperparathyroidism of renal origin: Secondary | ICD-10-CM | POA: Diagnosis not present

## 2016-02-04 DIAGNOSIS — D631 Anemia in chronic kidney disease: Secondary | ICD-10-CM | POA: Diagnosis not present

## 2016-02-06 DIAGNOSIS — N2581 Secondary hyperparathyroidism of renal origin: Secondary | ICD-10-CM | POA: Diagnosis not present

## 2016-02-06 DIAGNOSIS — N186 End stage renal disease: Secondary | ICD-10-CM | POA: Diagnosis not present

## 2016-02-06 DIAGNOSIS — E1129 Type 2 diabetes mellitus with other diabetic kidney complication: Secondary | ICD-10-CM | POA: Diagnosis not present

## 2016-02-06 DIAGNOSIS — D631 Anemia in chronic kidney disease: Secondary | ICD-10-CM | POA: Diagnosis not present

## 2016-02-06 DIAGNOSIS — D509 Iron deficiency anemia, unspecified: Secondary | ICD-10-CM | POA: Diagnosis not present

## 2016-02-08 DIAGNOSIS — N2581 Secondary hyperparathyroidism of renal origin: Secondary | ICD-10-CM | POA: Diagnosis not present

## 2016-02-08 DIAGNOSIS — N186 End stage renal disease: Secondary | ICD-10-CM | POA: Diagnosis not present

## 2016-02-08 DIAGNOSIS — D509 Iron deficiency anemia, unspecified: Secondary | ICD-10-CM | POA: Diagnosis not present

## 2016-02-08 DIAGNOSIS — E1129 Type 2 diabetes mellitus with other diabetic kidney complication: Secondary | ICD-10-CM | POA: Diagnosis not present

## 2016-02-08 DIAGNOSIS — D631 Anemia in chronic kidney disease: Secondary | ICD-10-CM | POA: Diagnosis not present

## 2016-02-11 DIAGNOSIS — N2581 Secondary hyperparathyroidism of renal origin: Secondary | ICD-10-CM | POA: Diagnosis not present

## 2016-02-11 DIAGNOSIS — E1129 Type 2 diabetes mellitus with other diabetic kidney complication: Secondary | ICD-10-CM | POA: Diagnosis not present

## 2016-02-11 DIAGNOSIS — D509 Iron deficiency anemia, unspecified: Secondary | ICD-10-CM | POA: Diagnosis not present

## 2016-02-11 DIAGNOSIS — N186 End stage renal disease: Secondary | ICD-10-CM | POA: Diagnosis not present

## 2016-02-11 DIAGNOSIS — D631 Anemia in chronic kidney disease: Secondary | ICD-10-CM | POA: Diagnosis not present

## 2016-02-13 DIAGNOSIS — D509 Iron deficiency anemia, unspecified: Secondary | ICD-10-CM | POA: Diagnosis not present

## 2016-02-13 DIAGNOSIS — N186 End stage renal disease: Secondary | ICD-10-CM | POA: Diagnosis not present

## 2016-02-13 DIAGNOSIS — D631 Anemia in chronic kidney disease: Secondary | ICD-10-CM | POA: Diagnosis not present

## 2016-02-13 DIAGNOSIS — E1129 Type 2 diabetes mellitus with other diabetic kidney complication: Secondary | ICD-10-CM | POA: Diagnosis not present

## 2016-02-13 DIAGNOSIS — N2581 Secondary hyperparathyroidism of renal origin: Secondary | ICD-10-CM | POA: Diagnosis not present

## 2016-02-15 DIAGNOSIS — N2581 Secondary hyperparathyroidism of renal origin: Secondary | ICD-10-CM | POA: Diagnosis not present

## 2016-02-15 DIAGNOSIS — E1129 Type 2 diabetes mellitus with other diabetic kidney complication: Secondary | ICD-10-CM | POA: Diagnosis not present

## 2016-02-15 DIAGNOSIS — N186 End stage renal disease: Secondary | ICD-10-CM | POA: Diagnosis not present

## 2016-02-15 DIAGNOSIS — D509 Iron deficiency anemia, unspecified: Secondary | ICD-10-CM | POA: Diagnosis not present

## 2016-02-15 DIAGNOSIS — D631 Anemia in chronic kidney disease: Secondary | ICD-10-CM | POA: Diagnosis not present

## 2016-02-17 DIAGNOSIS — E1129 Type 2 diabetes mellitus with other diabetic kidney complication: Secondary | ICD-10-CM | POA: Diagnosis not present

## 2016-02-17 DIAGNOSIS — N186 End stage renal disease: Secondary | ICD-10-CM | POA: Diagnosis not present

## 2016-02-17 DIAGNOSIS — Z992 Dependence on renal dialysis: Secondary | ICD-10-CM | POA: Diagnosis not present

## 2016-02-18 DIAGNOSIS — N2581 Secondary hyperparathyroidism of renal origin: Secondary | ICD-10-CM | POA: Diagnosis not present

## 2016-02-18 DIAGNOSIS — E1129 Type 2 diabetes mellitus with other diabetic kidney complication: Secondary | ICD-10-CM | POA: Diagnosis not present

## 2016-02-18 DIAGNOSIS — D631 Anemia in chronic kidney disease: Secondary | ICD-10-CM | POA: Diagnosis not present

## 2016-02-18 DIAGNOSIS — N186 End stage renal disease: Secondary | ICD-10-CM | POA: Diagnosis not present

## 2016-02-18 DIAGNOSIS — D509 Iron deficiency anemia, unspecified: Secondary | ICD-10-CM | POA: Diagnosis not present

## 2016-02-20 DIAGNOSIS — D631 Anemia in chronic kidney disease: Secondary | ICD-10-CM | POA: Diagnosis not present

## 2016-02-20 DIAGNOSIS — N186 End stage renal disease: Secondary | ICD-10-CM | POA: Diagnosis not present

## 2016-02-20 DIAGNOSIS — N2581 Secondary hyperparathyroidism of renal origin: Secondary | ICD-10-CM | POA: Diagnosis not present

## 2016-02-20 DIAGNOSIS — D509 Iron deficiency anemia, unspecified: Secondary | ICD-10-CM | POA: Diagnosis not present

## 2016-02-20 DIAGNOSIS — E1129 Type 2 diabetes mellitus with other diabetic kidney complication: Secondary | ICD-10-CM | POA: Diagnosis not present

## 2016-02-22 DIAGNOSIS — N2581 Secondary hyperparathyroidism of renal origin: Secondary | ICD-10-CM | POA: Diagnosis not present

## 2016-02-22 DIAGNOSIS — D509 Iron deficiency anemia, unspecified: Secondary | ICD-10-CM | POA: Diagnosis not present

## 2016-02-22 DIAGNOSIS — N186 End stage renal disease: Secondary | ICD-10-CM | POA: Diagnosis not present

## 2016-02-22 DIAGNOSIS — E1129 Type 2 diabetes mellitus with other diabetic kidney complication: Secondary | ICD-10-CM | POA: Diagnosis not present

## 2016-02-22 DIAGNOSIS — D631 Anemia in chronic kidney disease: Secondary | ICD-10-CM | POA: Diagnosis not present

## 2016-02-25 DIAGNOSIS — D631 Anemia in chronic kidney disease: Secondary | ICD-10-CM | POA: Diagnosis not present

## 2016-02-25 DIAGNOSIS — N186 End stage renal disease: Secondary | ICD-10-CM | POA: Diagnosis not present

## 2016-02-25 DIAGNOSIS — N2581 Secondary hyperparathyroidism of renal origin: Secondary | ICD-10-CM | POA: Diagnosis not present

## 2016-02-25 DIAGNOSIS — E1129 Type 2 diabetes mellitus with other diabetic kidney complication: Secondary | ICD-10-CM | POA: Diagnosis not present

## 2016-02-25 DIAGNOSIS — D509 Iron deficiency anemia, unspecified: Secondary | ICD-10-CM | POA: Diagnosis not present

## 2016-02-27 DIAGNOSIS — N186 End stage renal disease: Secondary | ICD-10-CM | POA: Diagnosis not present

## 2016-02-27 DIAGNOSIS — D631 Anemia in chronic kidney disease: Secondary | ICD-10-CM | POA: Diagnosis not present

## 2016-02-27 DIAGNOSIS — E1129 Type 2 diabetes mellitus with other diabetic kidney complication: Secondary | ICD-10-CM | POA: Diagnosis not present

## 2016-02-27 DIAGNOSIS — N2581 Secondary hyperparathyroidism of renal origin: Secondary | ICD-10-CM | POA: Diagnosis not present

## 2016-02-27 DIAGNOSIS — D509 Iron deficiency anemia, unspecified: Secondary | ICD-10-CM | POA: Diagnosis not present

## 2016-02-29 DIAGNOSIS — D631 Anemia in chronic kidney disease: Secondary | ICD-10-CM | POA: Diagnosis not present

## 2016-02-29 DIAGNOSIS — E1129 Type 2 diabetes mellitus with other diabetic kidney complication: Secondary | ICD-10-CM | POA: Diagnosis not present

## 2016-02-29 DIAGNOSIS — N2581 Secondary hyperparathyroidism of renal origin: Secondary | ICD-10-CM | POA: Diagnosis not present

## 2016-02-29 DIAGNOSIS — N186 End stage renal disease: Secondary | ICD-10-CM | POA: Diagnosis not present

## 2016-02-29 DIAGNOSIS — D509 Iron deficiency anemia, unspecified: Secondary | ICD-10-CM | POA: Diagnosis not present

## 2016-03-03 DIAGNOSIS — E1129 Type 2 diabetes mellitus with other diabetic kidney complication: Secondary | ICD-10-CM | POA: Diagnosis not present

## 2016-03-03 DIAGNOSIS — D631 Anemia in chronic kidney disease: Secondary | ICD-10-CM | POA: Diagnosis not present

## 2016-03-03 DIAGNOSIS — D509 Iron deficiency anemia, unspecified: Secondary | ICD-10-CM | POA: Diagnosis not present

## 2016-03-03 DIAGNOSIS — N2581 Secondary hyperparathyroidism of renal origin: Secondary | ICD-10-CM | POA: Diagnosis not present

## 2016-03-03 DIAGNOSIS — N186 End stage renal disease: Secondary | ICD-10-CM | POA: Diagnosis not present

## 2016-03-05 DIAGNOSIS — D509 Iron deficiency anemia, unspecified: Secondary | ICD-10-CM | POA: Diagnosis not present

## 2016-03-05 DIAGNOSIS — E1129 Type 2 diabetes mellitus with other diabetic kidney complication: Secondary | ICD-10-CM | POA: Diagnosis not present

## 2016-03-05 DIAGNOSIS — N2581 Secondary hyperparathyroidism of renal origin: Secondary | ICD-10-CM | POA: Diagnosis not present

## 2016-03-05 DIAGNOSIS — N186 End stage renal disease: Secondary | ICD-10-CM | POA: Diagnosis not present

## 2016-03-05 DIAGNOSIS — D631 Anemia in chronic kidney disease: Secondary | ICD-10-CM | POA: Diagnosis not present

## 2016-03-07 DIAGNOSIS — N186 End stage renal disease: Secondary | ICD-10-CM | POA: Diagnosis not present

## 2016-03-07 DIAGNOSIS — D509 Iron deficiency anemia, unspecified: Secondary | ICD-10-CM | POA: Diagnosis not present

## 2016-03-07 DIAGNOSIS — E1129 Type 2 diabetes mellitus with other diabetic kidney complication: Secondary | ICD-10-CM | POA: Diagnosis not present

## 2016-03-07 DIAGNOSIS — D631 Anemia in chronic kidney disease: Secondary | ICD-10-CM | POA: Diagnosis not present

## 2016-03-07 DIAGNOSIS — N2581 Secondary hyperparathyroidism of renal origin: Secondary | ICD-10-CM | POA: Diagnosis not present

## 2016-03-10 DIAGNOSIS — D631 Anemia in chronic kidney disease: Secondary | ICD-10-CM | POA: Diagnosis not present

## 2016-03-10 DIAGNOSIS — E1129 Type 2 diabetes mellitus with other diabetic kidney complication: Secondary | ICD-10-CM | POA: Diagnosis not present

## 2016-03-10 DIAGNOSIS — N186 End stage renal disease: Secondary | ICD-10-CM | POA: Diagnosis not present

## 2016-03-10 DIAGNOSIS — D509 Iron deficiency anemia, unspecified: Secondary | ICD-10-CM | POA: Diagnosis not present

## 2016-03-10 DIAGNOSIS — N2581 Secondary hyperparathyroidism of renal origin: Secondary | ICD-10-CM | POA: Diagnosis not present

## 2016-03-11 DIAGNOSIS — Z89512 Acquired absence of left leg below knee: Secondary | ICD-10-CM | POA: Diagnosis not present

## 2016-03-11 DIAGNOSIS — E1142 Type 2 diabetes mellitus with diabetic polyneuropathy: Secondary | ICD-10-CM | POA: Diagnosis not present

## 2016-03-11 DIAGNOSIS — I70244 Atherosclerosis of native arteries of left leg with ulceration of heel and midfoot: Secondary | ICD-10-CM | POA: Diagnosis not present

## 2016-03-11 DIAGNOSIS — E1152 Type 2 diabetes mellitus with diabetic peripheral angiopathy with gangrene: Secondary | ICD-10-CM | POA: Diagnosis not present

## 2016-03-12 DIAGNOSIS — N2581 Secondary hyperparathyroidism of renal origin: Secondary | ICD-10-CM | POA: Diagnosis not present

## 2016-03-12 DIAGNOSIS — E1129 Type 2 diabetes mellitus with other diabetic kidney complication: Secondary | ICD-10-CM | POA: Diagnosis not present

## 2016-03-12 DIAGNOSIS — N186 End stage renal disease: Secondary | ICD-10-CM | POA: Diagnosis not present

## 2016-03-12 DIAGNOSIS — D631 Anemia in chronic kidney disease: Secondary | ICD-10-CM | POA: Diagnosis not present

## 2016-03-12 DIAGNOSIS — D509 Iron deficiency anemia, unspecified: Secondary | ICD-10-CM | POA: Diagnosis not present

## 2016-03-14 DIAGNOSIS — D631 Anemia in chronic kidney disease: Secondary | ICD-10-CM | POA: Diagnosis not present

## 2016-03-14 DIAGNOSIS — N186 End stage renal disease: Secondary | ICD-10-CM | POA: Diagnosis not present

## 2016-03-14 DIAGNOSIS — D509 Iron deficiency anemia, unspecified: Secondary | ICD-10-CM | POA: Diagnosis not present

## 2016-03-14 DIAGNOSIS — N2581 Secondary hyperparathyroidism of renal origin: Secondary | ICD-10-CM | POA: Diagnosis not present

## 2016-03-14 DIAGNOSIS — E1129 Type 2 diabetes mellitus with other diabetic kidney complication: Secondary | ICD-10-CM | POA: Diagnosis not present

## 2016-03-17 DIAGNOSIS — D631 Anemia in chronic kidney disease: Secondary | ICD-10-CM | POA: Diagnosis not present

## 2016-03-17 DIAGNOSIS — N186 End stage renal disease: Secondary | ICD-10-CM | POA: Diagnosis not present

## 2016-03-17 DIAGNOSIS — E1129 Type 2 diabetes mellitus with other diabetic kidney complication: Secondary | ICD-10-CM | POA: Diagnosis not present

## 2016-03-17 DIAGNOSIS — D509 Iron deficiency anemia, unspecified: Secondary | ICD-10-CM | POA: Diagnosis not present

## 2016-03-17 DIAGNOSIS — N2581 Secondary hyperparathyroidism of renal origin: Secondary | ICD-10-CM | POA: Diagnosis not present

## 2016-03-19 DIAGNOSIS — N186 End stage renal disease: Secondary | ICD-10-CM | POA: Diagnosis not present

## 2016-03-19 DIAGNOSIS — D509 Iron deficiency anemia, unspecified: Secondary | ICD-10-CM | POA: Diagnosis not present

## 2016-03-19 DIAGNOSIS — Z992 Dependence on renal dialysis: Secondary | ICD-10-CM | POA: Diagnosis not present

## 2016-03-19 DIAGNOSIS — N2581 Secondary hyperparathyroidism of renal origin: Secondary | ICD-10-CM | POA: Diagnosis not present

## 2016-03-19 DIAGNOSIS — E1129 Type 2 diabetes mellitus with other diabetic kidney complication: Secondary | ICD-10-CM | POA: Diagnosis not present

## 2016-03-19 DIAGNOSIS — D631 Anemia in chronic kidney disease: Secondary | ICD-10-CM | POA: Diagnosis not present

## 2016-03-21 DIAGNOSIS — D509 Iron deficiency anemia, unspecified: Secondary | ICD-10-CM | POA: Diagnosis not present

## 2016-03-21 DIAGNOSIS — N186 End stage renal disease: Secondary | ICD-10-CM | POA: Diagnosis not present

## 2016-03-21 DIAGNOSIS — E1129 Type 2 diabetes mellitus with other diabetic kidney complication: Secondary | ICD-10-CM | POA: Diagnosis not present

## 2016-03-21 DIAGNOSIS — Z23 Encounter for immunization: Secondary | ICD-10-CM | POA: Diagnosis not present

## 2016-03-21 DIAGNOSIS — N2581 Secondary hyperparathyroidism of renal origin: Secondary | ICD-10-CM | POA: Diagnosis not present

## 2016-03-24 DIAGNOSIS — E1129 Type 2 diabetes mellitus with other diabetic kidney complication: Secondary | ICD-10-CM | POA: Diagnosis not present

## 2016-03-24 DIAGNOSIS — Z23 Encounter for immunization: Secondary | ICD-10-CM | POA: Diagnosis not present

## 2016-03-24 DIAGNOSIS — D509 Iron deficiency anemia, unspecified: Secondary | ICD-10-CM | POA: Diagnosis not present

## 2016-03-24 DIAGNOSIS — N186 End stage renal disease: Secondary | ICD-10-CM | POA: Diagnosis not present

## 2016-03-24 DIAGNOSIS — N2581 Secondary hyperparathyroidism of renal origin: Secondary | ICD-10-CM | POA: Diagnosis not present

## 2016-03-26 DIAGNOSIS — N2581 Secondary hyperparathyroidism of renal origin: Secondary | ICD-10-CM | POA: Diagnosis not present

## 2016-03-26 DIAGNOSIS — Z23 Encounter for immunization: Secondary | ICD-10-CM | POA: Diagnosis not present

## 2016-03-26 DIAGNOSIS — E1129 Type 2 diabetes mellitus with other diabetic kidney complication: Secondary | ICD-10-CM | POA: Diagnosis not present

## 2016-03-26 DIAGNOSIS — N186 End stage renal disease: Secondary | ICD-10-CM | POA: Diagnosis not present

## 2016-03-26 DIAGNOSIS — D509 Iron deficiency anemia, unspecified: Secondary | ICD-10-CM | POA: Diagnosis not present

## 2016-03-28 DIAGNOSIS — E1129 Type 2 diabetes mellitus with other diabetic kidney complication: Secondary | ICD-10-CM | POA: Diagnosis not present

## 2016-03-28 DIAGNOSIS — N186 End stage renal disease: Secondary | ICD-10-CM | POA: Diagnosis not present

## 2016-03-28 DIAGNOSIS — D509 Iron deficiency anemia, unspecified: Secondary | ICD-10-CM | POA: Diagnosis not present

## 2016-03-28 DIAGNOSIS — N2581 Secondary hyperparathyroidism of renal origin: Secondary | ICD-10-CM | POA: Diagnosis not present

## 2016-03-28 DIAGNOSIS — Z23 Encounter for immunization: Secondary | ICD-10-CM | POA: Diagnosis not present

## 2016-03-30 ENCOUNTER — Other Ambulatory Visit: Payer: Self-pay | Admitting: Surgery

## 2016-03-31 DIAGNOSIS — E1129 Type 2 diabetes mellitus with other diabetic kidney complication: Secondary | ICD-10-CM | POA: Diagnosis not present

## 2016-03-31 DIAGNOSIS — N186 End stage renal disease: Secondary | ICD-10-CM | POA: Diagnosis not present

## 2016-03-31 DIAGNOSIS — N2581 Secondary hyperparathyroidism of renal origin: Secondary | ICD-10-CM | POA: Diagnosis not present

## 2016-03-31 DIAGNOSIS — D509 Iron deficiency anemia, unspecified: Secondary | ICD-10-CM | POA: Diagnosis not present

## 2016-03-31 DIAGNOSIS — Z23 Encounter for immunization: Secondary | ICD-10-CM | POA: Diagnosis not present

## 2016-04-02 DIAGNOSIS — E1129 Type 2 diabetes mellitus with other diabetic kidney complication: Secondary | ICD-10-CM | POA: Diagnosis not present

## 2016-04-02 DIAGNOSIS — N2581 Secondary hyperparathyroidism of renal origin: Secondary | ICD-10-CM | POA: Diagnosis not present

## 2016-04-02 DIAGNOSIS — N186 End stage renal disease: Secondary | ICD-10-CM | POA: Diagnosis not present

## 2016-04-02 DIAGNOSIS — Z23 Encounter for immunization: Secondary | ICD-10-CM | POA: Diagnosis not present

## 2016-04-02 DIAGNOSIS — D509 Iron deficiency anemia, unspecified: Secondary | ICD-10-CM | POA: Diagnosis not present

## 2016-04-04 DIAGNOSIS — Z23 Encounter for immunization: Secondary | ICD-10-CM | POA: Diagnosis not present

## 2016-04-04 DIAGNOSIS — E1129 Type 2 diabetes mellitus with other diabetic kidney complication: Secondary | ICD-10-CM | POA: Diagnosis not present

## 2016-04-04 DIAGNOSIS — N186 End stage renal disease: Secondary | ICD-10-CM | POA: Diagnosis not present

## 2016-04-04 DIAGNOSIS — D509 Iron deficiency anemia, unspecified: Secondary | ICD-10-CM | POA: Diagnosis not present

## 2016-04-04 DIAGNOSIS — N2581 Secondary hyperparathyroidism of renal origin: Secondary | ICD-10-CM | POA: Diagnosis not present

## 2016-04-07 DIAGNOSIS — N186 End stage renal disease: Secondary | ICD-10-CM | POA: Diagnosis not present

## 2016-04-07 DIAGNOSIS — D509 Iron deficiency anemia, unspecified: Secondary | ICD-10-CM | POA: Diagnosis not present

## 2016-04-07 DIAGNOSIS — E1129 Type 2 diabetes mellitus with other diabetic kidney complication: Secondary | ICD-10-CM | POA: Diagnosis not present

## 2016-04-07 DIAGNOSIS — N2581 Secondary hyperparathyroidism of renal origin: Secondary | ICD-10-CM | POA: Diagnosis not present

## 2016-04-07 DIAGNOSIS — Z23 Encounter for immunization: Secondary | ICD-10-CM | POA: Diagnosis not present

## 2016-04-09 DIAGNOSIS — N186 End stage renal disease: Secondary | ICD-10-CM | POA: Diagnosis not present

## 2016-04-09 DIAGNOSIS — D509 Iron deficiency anemia, unspecified: Secondary | ICD-10-CM | POA: Diagnosis not present

## 2016-04-09 DIAGNOSIS — Z23 Encounter for immunization: Secondary | ICD-10-CM | POA: Diagnosis not present

## 2016-04-09 DIAGNOSIS — E1129 Type 2 diabetes mellitus with other diabetic kidney complication: Secondary | ICD-10-CM | POA: Diagnosis not present

## 2016-04-09 DIAGNOSIS — N2581 Secondary hyperparathyroidism of renal origin: Secondary | ICD-10-CM | POA: Diagnosis not present

## 2016-04-11 DIAGNOSIS — D509 Iron deficiency anemia, unspecified: Secondary | ICD-10-CM | POA: Diagnosis not present

## 2016-04-11 DIAGNOSIS — N2581 Secondary hyperparathyroidism of renal origin: Secondary | ICD-10-CM | POA: Diagnosis not present

## 2016-04-11 DIAGNOSIS — N186 End stage renal disease: Secondary | ICD-10-CM | POA: Diagnosis not present

## 2016-04-11 DIAGNOSIS — E1129 Type 2 diabetes mellitus with other diabetic kidney complication: Secondary | ICD-10-CM | POA: Diagnosis not present

## 2016-04-11 DIAGNOSIS — Z23 Encounter for immunization: Secondary | ICD-10-CM | POA: Diagnosis not present

## 2016-04-13 DIAGNOSIS — E1122 Type 2 diabetes mellitus with diabetic chronic kidney disease: Secondary | ICD-10-CM | POA: Diagnosis not present

## 2016-04-13 DIAGNOSIS — N184 Chronic kidney disease, stage 4 (severe): Secondary | ICD-10-CM | POA: Diagnosis not present

## 2016-04-14 DIAGNOSIS — N186 End stage renal disease: Secondary | ICD-10-CM | POA: Diagnosis not present

## 2016-04-14 DIAGNOSIS — D509 Iron deficiency anemia, unspecified: Secondary | ICD-10-CM | POA: Diagnosis not present

## 2016-04-14 DIAGNOSIS — E1129 Type 2 diabetes mellitus with other diabetic kidney complication: Secondary | ICD-10-CM | POA: Diagnosis not present

## 2016-04-14 DIAGNOSIS — Z23 Encounter for immunization: Secondary | ICD-10-CM | POA: Diagnosis not present

## 2016-04-14 DIAGNOSIS — N2581 Secondary hyperparathyroidism of renal origin: Secondary | ICD-10-CM | POA: Diagnosis not present

## 2016-04-16 DIAGNOSIS — D509 Iron deficiency anemia, unspecified: Secondary | ICD-10-CM | POA: Diagnosis not present

## 2016-04-16 DIAGNOSIS — N186 End stage renal disease: Secondary | ICD-10-CM | POA: Diagnosis not present

## 2016-04-16 DIAGNOSIS — N2581 Secondary hyperparathyroidism of renal origin: Secondary | ICD-10-CM | POA: Diagnosis not present

## 2016-04-16 DIAGNOSIS — E1129 Type 2 diabetes mellitus with other diabetic kidney complication: Secondary | ICD-10-CM | POA: Diagnosis not present

## 2016-04-16 DIAGNOSIS — Z23 Encounter for immunization: Secondary | ICD-10-CM | POA: Diagnosis not present

## 2016-04-18 DIAGNOSIS — D509 Iron deficiency anemia, unspecified: Secondary | ICD-10-CM | POA: Diagnosis not present

## 2016-04-18 DIAGNOSIS — N186 End stage renal disease: Secondary | ICD-10-CM | POA: Diagnosis not present

## 2016-04-18 DIAGNOSIS — N2581 Secondary hyperparathyroidism of renal origin: Secondary | ICD-10-CM | POA: Diagnosis not present

## 2016-04-18 DIAGNOSIS — Z992 Dependence on renal dialysis: Secondary | ICD-10-CM | POA: Diagnosis not present

## 2016-04-18 DIAGNOSIS — E1129 Type 2 diabetes mellitus with other diabetic kidney complication: Secondary | ICD-10-CM | POA: Diagnosis not present

## 2016-04-18 DIAGNOSIS — Z23 Encounter for immunization: Secondary | ICD-10-CM | POA: Diagnosis not present

## 2016-04-21 DIAGNOSIS — N2581 Secondary hyperparathyroidism of renal origin: Secondary | ICD-10-CM | POA: Diagnosis not present

## 2016-04-21 DIAGNOSIS — E1129 Type 2 diabetes mellitus with other diabetic kidney complication: Secondary | ICD-10-CM | POA: Diagnosis not present

## 2016-04-21 DIAGNOSIS — N186 End stage renal disease: Secondary | ICD-10-CM | POA: Diagnosis not present

## 2016-04-21 DIAGNOSIS — D509 Iron deficiency anemia, unspecified: Secondary | ICD-10-CM | POA: Diagnosis not present

## 2016-04-23 DIAGNOSIS — D509 Iron deficiency anemia, unspecified: Secondary | ICD-10-CM | POA: Diagnosis not present

## 2016-04-23 DIAGNOSIS — E1129 Type 2 diabetes mellitus with other diabetic kidney complication: Secondary | ICD-10-CM | POA: Diagnosis not present

## 2016-04-23 DIAGNOSIS — N2581 Secondary hyperparathyroidism of renal origin: Secondary | ICD-10-CM | POA: Diagnosis not present

## 2016-04-23 DIAGNOSIS — N186 End stage renal disease: Secondary | ICD-10-CM | POA: Diagnosis not present

## 2016-04-25 DIAGNOSIS — N186 End stage renal disease: Secondary | ICD-10-CM | POA: Diagnosis not present

## 2016-04-25 DIAGNOSIS — N2581 Secondary hyperparathyroidism of renal origin: Secondary | ICD-10-CM | POA: Diagnosis not present

## 2016-04-25 DIAGNOSIS — E1129 Type 2 diabetes mellitus with other diabetic kidney complication: Secondary | ICD-10-CM | POA: Diagnosis not present

## 2016-04-25 DIAGNOSIS — D509 Iron deficiency anemia, unspecified: Secondary | ICD-10-CM | POA: Diagnosis not present

## 2016-04-28 DIAGNOSIS — E1129 Type 2 diabetes mellitus with other diabetic kidney complication: Secondary | ICD-10-CM | POA: Diagnosis not present

## 2016-04-28 DIAGNOSIS — N2581 Secondary hyperparathyroidism of renal origin: Secondary | ICD-10-CM | POA: Diagnosis not present

## 2016-04-28 DIAGNOSIS — N186 End stage renal disease: Secondary | ICD-10-CM | POA: Diagnosis not present

## 2016-04-28 DIAGNOSIS — D509 Iron deficiency anemia, unspecified: Secondary | ICD-10-CM | POA: Diagnosis not present

## 2016-04-30 DIAGNOSIS — N2581 Secondary hyperparathyroidism of renal origin: Secondary | ICD-10-CM | POA: Diagnosis not present

## 2016-04-30 DIAGNOSIS — E1129 Type 2 diabetes mellitus with other diabetic kidney complication: Secondary | ICD-10-CM | POA: Diagnosis not present

## 2016-04-30 DIAGNOSIS — D509 Iron deficiency anemia, unspecified: Secondary | ICD-10-CM | POA: Diagnosis not present

## 2016-04-30 DIAGNOSIS — N186 End stage renal disease: Secondary | ICD-10-CM | POA: Diagnosis not present

## 2016-05-02 DIAGNOSIS — N186 End stage renal disease: Secondary | ICD-10-CM | POA: Diagnosis not present

## 2016-05-02 DIAGNOSIS — D509 Iron deficiency anemia, unspecified: Secondary | ICD-10-CM | POA: Diagnosis not present

## 2016-05-02 DIAGNOSIS — N2581 Secondary hyperparathyroidism of renal origin: Secondary | ICD-10-CM | POA: Diagnosis not present

## 2016-05-02 DIAGNOSIS — E1129 Type 2 diabetes mellitus with other diabetic kidney complication: Secondary | ICD-10-CM | POA: Diagnosis not present

## 2016-05-05 DIAGNOSIS — N2581 Secondary hyperparathyroidism of renal origin: Secondary | ICD-10-CM | POA: Diagnosis not present

## 2016-05-05 DIAGNOSIS — E1129 Type 2 diabetes mellitus with other diabetic kidney complication: Secondary | ICD-10-CM | POA: Diagnosis not present

## 2016-05-05 DIAGNOSIS — N186 End stage renal disease: Secondary | ICD-10-CM | POA: Diagnosis not present

## 2016-05-05 DIAGNOSIS — D509 Iron deficiency anemia, unspecified: Secondary | ICD-10-CM | POA: Diagnosis not present

## 2016-05-07 DIAGNOSIS — N2581 Secondary hyperparathyroidism of renal origin: Secondary | ICD-10-CM | POA: Diagnosis not present

## 2016-05-07 DIAGNOSIS — D509 Iron deficiency anemia, unspecified: Secondary | ICD-10-CM | POA: Diagnosis not present

## 2016-05-07 DIAGNOSIS — E1129 Type 2 diabetes mellitus with other diabetic kidney complication: Secondary | ICD-10-CM | POA: Diagnosis not present

## 2016-05-07 DIAGNOSIS — N186 End stage renal disease: Secondary | ICD-10-CM | POA: Diagnosis not present

## 2016-05-09 DIAGNOSIS — E1129 Type 2 diabetes mellitus with other diabetic kidney complication: Secondary | ICD-10-CM | POA: Diagnosis not present

## 2016-05-09 DIAGNOSIS — N186 End stage renal disease: Secondary | ICD-10-CM | POA: Diagnosis not present

## 2016-05-09 DIAGNOSIS — N2581 Secondary hyperparathyroidism of renal origin: Secondary | ICD-10-CM | POA: Diagnosis not present

## 2016-05-09 DIAGNOSIS — D509 Iron deficiency anemia, unspecified: Secondary | ICD-10-CM | POA: Diagnosis not present

## 2016-05-12 DIAGNOSIS — D509 Iron deficiency anemia, unspecified: Secondary | ICD-10-CM | POA: Diagnosis not present

## 2016-05-12 DIAGNOSIS — E1129 Type 2 diabetes mellitus with other diabetic kidney complication: Secondary | ICD-10-CM | POA: Diagnosis not present

## 2016-05-12 DIAGNOSIS — N2581 Secondary hyperparathyroidism of renal origin: Secondary | ICD-10-CM | POA: Diagnosis not present

## 2016-05-12 DIAGNOSIS — N186 End stage renal disease: Secondary | ICD-10-CM | POA: Diagnosis not present

## 2016-05-14 DIAGNOSIS — N186 End stage renal disease: Secondary | ICD-10-CM | POA: Diagnosis not present

## 2016-05-14 DIAGNOSIS — N2581 Secondary hyperparathyroidism of renal origin: Secondary | ICD-10-CM | POA: Diagnosis not present

## 2016-05-14 DIAGNOSIS — E1129 Type 2 diabetes mellitus with other diabetic kidney complication: Secondary | ICD-10-CM | POA: Diagnosis not present

## 2016-05-14 DIAGNOSIS — D509 Iron deficiency anemia, unspecified: Secondary | ICD-10-CM | POA: Diagnosis not present

## 2016-05-16 DIAGNOSIS — N2581 Secondary hyperparathyroidism of renal origin: Secondary | ICD-10-CM | POA: Diagnosis not present

## 2016-05-16 DIAGNOSIS — N186 End stage renal disease: Secondary | ICD-10-CM | POA: Diagnosis not present

## 2016-05-16 DIAGNOSIS — E1129 Type 2 diabetes mellitus with other diabetic kidney complication: Secondary | ICD-10-CM | POA: Diagnosis not present

## 2016-05-16 DIAGNOSIS — D509 Iron deficiency anemia, unspecified: Secondary | ICD-10-CM | POA: Diagnosis not present

## 2016-05-19 DIAGNOSIS — N186 End stage renal disease: Secondary | ICD-10-CM | POA: Diagnosis not present

## 2016-05-19 DIAGNOSIS — D509 Iron deficiency anemia, unspecified: Secondary | ICD-10-CM | POA: Diagnosis not present

## 2016-05-19 DIAGNOSIS — Z992 Dependence on renal dialysis: Secondary | ICD-10-CM | POA: Diagnosis not present

## 2016-05-19 DIAGNOSIS — N2581 Secondary hyperparathyroidism of renal origin: Secondary | ICD-10-CM | POA: Diagnosis not present

## 2016-05-19 DIAGNOSIS — E1129 Type 2 diabetes mellitus with other diabetic kidney complication: Secondary | ICD-10-CM | POA: Diagnosis not present

## 2016-05-21 DIAGNOSIS — N186 End stage renal disease: Secondary | ICD-10-CM | POA: Diagnosis not present

## 2016-05-21 DIAGNOSIS — N2581 Secondary hyperparathyroidism of renal origin: Secondary | ICD-10-CM | POA: Diagnosis not present

## 2016-05-21 DIAGNOSIS — E1129 Type 2 diabetes mellitus with other diabetic kidney complication: Secondary | ICD-10-CM | POA: Diagnosis not present

## 2016-05-21 DIAGNOSIS — D509 Iron deficiency anemia, unspecified: Secondary | ICD-10-CM | POA: Diagnosis not present

## 2016-05-21 DIAGNOSIS — D631 Anemia in chronic kidney disease: Secondary | ICD-10-CM | POA: Diagnosis not present

## 2016-05-23 DIAGNOSIS — N2581 Secondary hyperparathyroidism of renal origin: Secondary | ICD-10-CM | POA: Diagnosis not present

## 2016-05-23 DIAGNOSIS — D509 Iron deficiency anemia, unspecified: Secondary | ICD-10-CM | POA: Diagnosis not present

## 2016-05-23 DIAGNOSIS — E1129 Type 2 diabetes mellitus with other diabetic kidney complication: Secondary | ICD-10-CM | POA: Diagnosis not present

## 2016-05-23 DIAGNOSIS — N186 End stage renal disease: Secondary | ICD-10-CM | POA: Diagnosis not present

## 2016-05-23 DIAGNOSIS — D631 Anemia in chronic kidney disease: Secondary | ICD-10-CM | POA: Diagnosis not present

## 2016-05-26 DIAGNOSIS — D509 Iron deficiency anemia, unspecified: Secondary | ICD-10-CM | POA: Diagnosis not present

## 2016-05-26 DIAGNOSIS — D631 Anemia in chronic kidney disease: Secondary | ICD-10-CM | POA: Diagnosis not present

## 2016-05-26 DIAGNOSIS — N186 End stage renal disease: Secondary | ICD-10-CM | POA: Diagnosis not present

## 2016-05-26 DIAGNOSIS — N2581 Secondary hyperparathyroidism of renal origin: Secondary | ICD-10-CM | POA: Diagnosis not present

## 2016-05-26 DIAGNOSIS — E1129 Type 2 diabetes mellitus with other diabetic kidney complication: Secondary | ICD-10-CM | POA: Diagnosis not present

## 2016-05-27 DIAGNOSIS — I871 Compression of vein: Secondary | ICD-10-CM | POA: Diagnosis not present

## 2016-05-27 DIAGNOSIS — Z992 Dependence on renal dialysis: Secondary | ICD-10-CM | POA: Diagnosis not present

## 2016-05-27 DIAGNOSIS — T82858D Stenosis of vascular prosthetic devices, implants and grafts, subsequent encounter: Secondary | ICD-10-CM | POA: Diagnosis not present

## 2016-05-27 DIAGNOSIS — N186 End stage renal disease: Secondary | ICD-10-CM | POA: Diagnosis not present

## 2016-05-28 DIAGNOSIS — E1129 Type 2 diabetes mellitus with other diabetic kidney complication: Secondary | ICD-10-CM | POA: Diagnosis not present

## 2016-05-28 DIAGNOSIS — N186 End stage renal disease: Secondary | ICD-10-CM | POA: Diagnosis not present

## 2016-05-28 DIAGNOSIS — D509 Iron deficiency anemia, unspecified: Secondary | ICD-10-CM | POA: Diagnosis not present

## 2016-05-28 DIAGNOSIS — D631 Anemia in chronic kidney disease: Secondary | ICD-10-CM | POA: Diagnosis not present

## 2016-05-28 DIAGNOSIS — N2581 Secondary hyperparathyroidism of renal origin: Secondary | ICD-10-CM | POA: Diagnosis not present

## 2016-05-30 DIAGNOSIS — D509 Iron deficiency anemia, unspecified: Secondary | ICD-10-CM | POA: Diagnosis not present

## 2016-05-30 DIAGNOSIS — D631 Anemia in chronic kidney disease: Secondary | ICD-10-CM | POA: Diagnosis not present

## 2016-05-30 DIAGNOSIS — N2581 Secondary hyperparathyroidism of renal origin: Secondary | ICD-10-CM | POA: Diagnosis not present

## 2016-05-30 DIAGNOSIS — E1129 Type 2 diabetes mellitus with other diabetic kidney complication: Secondary | ICD-10-CM | POA: Diagnosis not present

## 2016-05-30 DIAGNOSIS — N186 End stage renal disease: Secondary | ICD-10-CM | POA: Diagnosis not present

## 2016-06-02 DIAGNOSIS — N2581 Secondary hyperparathyroidism of renal origin: Secondary | ICD-10-CM | POA: Diagnosis not present

## 2016-06-02 DIAGNOSIS — N186 End stage renal disease: Secondary | ICD-10-CM | POA: Diagnosis not present

## 2016-06-02 DIAGNOSIS — D631 Anemia in chronic kidney disease: Secondary | ICD-10-CM | POA: Diagnosis not present

## 2016-06-02 DIAGNOSIS — E1129 Type 2 diabetes mellitus with other diabetic kidney complication: Secondary | ICD-10-CM | POA: Diagnosis not present

## 2016-06-02 DIAGNOSIS — D509 Iron deficiency anemia, unspecified: Secondary | ICD-10-CM | POA: Diagnosis not present

## 2016-06-04 DIAGNOSIS — N2581 Secondary hyperparathyroidism of renal origin: Secondary | ICD-10-CM | POA: Diagnosis not present

## 2016-06-04 DIAGNOSIS — N186 End stage renal disease: Secondary | ICD-10-CM | POA: Diagnosis not present

## 2016-06-04 DIAGNOSIS — E1129 Type 2 diabetes mellitus with other diabetic kidney complication: Secondary | ICD-10-CM | POA: Diagnosis not present

## 2016-06-04 DIAGNOSIS — D631 Anemia in chronic kidney disease: Secondary | ICD-10-CM | POA: Diagnosis not present

## 2016-06-04 DIAGNOSIS — D509 Iron deficiency anemia, unspecified: Secondary | ICD-10-CM | POA: Diagnosis not present

## 2016-06-06 DIAGNOSIS — D631 Anemia in chronic kidney disease: Secondary | ICD-10-CM | POA: Diagnosis not present

## 2016-06-06 DIAGNOSIS — D509 Iron deficiency anemia, unspecified: Secondary | ICD-10-CM | POA: Diagnosis not present

## 2016-06-06 DIAGNOSIS — E1129 Type 2 diabetes mellitus with other diabetic kidney complication: Secondary | ICD-10-CM | POA: Diagnosis not present

## 2016-06-06 DIAGNOSIS — N186 End stage renal disease: Secondary | ICD-10-CM | POA: Diagnosis not present

## 2016-06-06 DIAGNOSIS — N2581 Secondary hyperparathyroidism of renal origin: Secondary | ICD-10-CM | POA: Diagnosis not present

## 2016-06-10 DIAGNOSIS — N186 End stage renal disease: Secondary | ICD-10-CM | POA: Diagnosis not present

## 2016-06-10 DIAGNOSIS — D509 Iron deficiency anemia, unspecified: Secondary | ICD-10-CM | POA: Diagnosis not present

## 2016-06-10 DIAGNOSIS — N2581 Secondary hyperparathyroidism of renal origin: Secondary | ICD-10-CM | POA: Diagnosis not present

## 2016-06-10 DIAGNOSIS — D631 Anemia in chronic kidney disease: Secondary | ICD-10-CM | POA: Diagnosis not present

## 2016-06-10 DIAGNOSIS — E1129 Type 2 diabetes mellitus with other diabetic kidney complication: Secondary | ICD-10-CM | POA: Diagnosis not present

## 2016-06-13 DIAGNOSIS — E1129 Type 2 diabetes mellitus with other diabetic kidney complication: Secondary | ICD-10-CM | POA: Diagnosis not present

## 2016-06-13 DIAGNOSIS — D631 Anemia in chronic kidney disease: Secondary | ICD-10-CM | POA: Diagnosis not present

## 2016-06-13 DIAGNOSIS — D509 Iron deficiency anemia, unspecified: Secondary | ICD-10-CM | POA: Diagnosis not present

## 2016-06-13 DIAGNOSIS — N2581 Secondary hyperparathyroidism of renal origin: Secondary | ICD-10-CM | POA: Diagnosis not present

## 2016-06-13 DIAGNOSIS — N186 End stage renal disease: Secondary | ICD-10-CM | POA: Diagnosis not present

## 2016-06-16 DIAGNOSIS — D509 Iron deficiency anemia, unspecified: Secondary | ICD-10-CM | POA: Diagnosis not present

## 2016-06-16 DIAGNOSIS — E1129 Type 2 diabetes mellitus with other diabetic kidney complication: Secondary | ICD-10-CM | POA: Diagnosis not present

## 2016-06-16 DIAGNOSIS — N2581 Secondary hyperparathyroidism of renal origin: Secondary | ICD-10-CM | POA: Diagnosis not present

## 2016-06-16 DIAGNOSIS — N186 End stage renal disease: Secondary | ICD-10-CM | POA: Diagnosis not present

## 2016-06-16 DIAGNOSIS — D631 Anemia in chronic kidney disease: Secondary | ICD-10-CM | POA: Diagnosis not present

## 2016-06-18 DIAGNOSIS — N186 End stage renal disease: Secondary | ICD-10-CM | POA: Diagnosis not present

## 2016-06-18 DIAGNOSIS — N2581 Secondary hyperparathyroidism of renal origin: Secondary | ICD-10-CM | POA: Diagnosis not present

## 2016-06-18 DIAGNOSIS — D509 Iron deficiency anemia, unspecified: Secondary | ICD-10-CM | POA: Diagnosis not present

## 2016-06-18 DIAGNOSIS — E1129 Type 2 diabetes mellitus with other diabetic kidney complication: Secondary | ICD-10-CM | POA: Diagnosis not present

## 2016-06-18 DIAGNOSIS — Z992 Dependence on renal dialysis: Secondary | ICD-10-CM | POA: Diagnosis not present

## 2016-06-18 DIAGNOSIS — D631 Anemia in chronic kidney disease: Secondary | ICD-10-CM | POA: Diagnosis not present

## 2016-06-20 DIAGNOSIS — D631 Anemia in chronic kidney disease: Secondary | ICD-10-CM | POA: Diagnosis not present

## 2016-06-20 DIAGNOSIS — N186 End stage renal disease: Secondary | ICD-10-CM | POA: Diagnosis not present

## 2016-06-20 DIAGNOSIS — N2581 Secondary hyperparathyroidism of renal origin: Secondary | ICD-10-CM | POA: Diagnosis not present

## 2016-06-20 DIAGNOSIS — D509 Iron deficiency anemia, unspecified: Secondary | ICD-10-CM | POA: Diagnosis not present

## 2016-06-20 DIAGNOSIS — E1129 Type 2 diabetes mellitus with other diabetic kidney complication: Secondary | ICD-10-CM | POA: Diagnosis not present

## 2016-06-23 DIAGNOSIS — N186 End stage renal disease: Secondary | ICD-10-CM | POA: Diagnosis not present

## 2016-06-23 DIAGNOSIS — N2581 Secondary hyperparathyroidism of renal origin: Secondary | ICD-10-CM | POA: Diagnosis not present

## 2016-06-23 DIAGNOSIS — D509 Iron deficiency anemia, unspecified: Secondary | ICD-10-CM | POA: Diagnosis not present

## 2016-06-23 DIAGNOSIS — E1129 Type 2 diabetes mellitus with other diabetic kidney complication: Secondary | ICD-10-CM | POA: Diagnosis not present

## 2016-06-23 DIAGNOSIS — D631 Anemia in chronic kidney disease: Secondary | ICD-10-CM | POA: Diagnosis not present

## 2016-06-25 DIAGNOSIS — N186 End stage renal disease: Secondary | ICD-10-CM | POA: Diagnosis not present

## 2016-06-25 DIAGNOSIS — E1129 Type 2 diabetes mellitus with other diabetic kidney complication: Secondary | ICD-10-CM | POA: Diagnosis not present

## 2016-06-25 DIAGNOSIS — N2581 Secondary hyperparathyroidism of renal origin: Secondary | ICD-10-CM | POA: Diagnosis not present

## 2016-06-25 DIAGNOSIS — D509 Iron deficiency anemia, unspecified: Secondary | ICD-10-CM | POA: Diagnosis not present

## 2016-06-25 DIAGNOSIS — D631 Anemia in chronic kidney disease: Secondary | ICD-10-CM | POA: Diagnosis not present

## 2016-06-27 DIAGNOSIS — N2581 Secondary hyperparathyroidism of renal origin: Secondary | ICD-10-CM | POA: Diagnosis not present

## 2016-06-27 DIAGNOSIS — N186 End stage renal disease: Secondary | ICD-10-CM | POA: Diagnosis not present

## 2016-06-27 DIAGNOSIS — D631 Anemia in chronic kidney disease: Secondary | ICD-10-CM | POA: Diagnosis not present

## 2016-06-27 DIAGNOSIS — E1129 Type 2 diabetes mellitus with other diabetic kidney complication: Secondary | ICD-10-CM | POA: Diagnosis not present

## 2016-06-27 DIAGNOSIS — D509 Iron deficiency anemia, unspecified: Secondary | ICD-10-CM | POA: Diagnosis not present

## 2016-06-30 DIAGNOSIS — D509 Iron deficiency anemia, unspecified: Secondary | ICD-10-CM | POA: Diagnosis not present

## 2016-06-30 DIAGNOSIS — N186 End stage renal disease: Secondary | ICD-10-CM | POA: Diagnosis not present

## 2016-06-30 DIAGNOSIS — D631 Anemia in chronic kidney disease: Secondary | ICD-10-CM | POA: Diagnosis not present

## 2016-06-30 DIAGNOSIS — N2581 Secondary hyperparathyroidism of renal origin: Secondary | ICD-10-CM | POA: Diagnosis not present

## 2016-06-30 DIAGNOSIS — E1129 Type 2 diabetes mellitus with other diabetic kidney complication: Secondary | ICD-10-CM | POA: Diagnosis not present

## 2016-07-02 DIAGNOSIS — N2581 Secondary hyperparathyroidism of renal origin: Secondary | ICD-10-CM | POA: Diagnosis not present

## 2016-07-02 DIAGNOSIS — D509 Iron deficiency anemia, unspecified: Secondary | ICD-10-CM | POA: Diagnosis not present

## 2016-07-02 DIAGNOSIS — E1129 Type 2 diabetes mellitus with other diabetic kidney complication: Secondary | ICD-10-CM | POA: Diagnosis not present

## 2016-07-02 DIAGNOSIS — D631 Anemia in chronic kidney disease: Secondary | ICD-10-CM | POA: Diagnosis not present

## 2016-07-02 DIAGNOSIS — N186 End stage renal disease: Secondary | ICD-10-CM | POA: Diagnosis not present

## 2016-07-04 DIAGNOSIS — D631 Anemia in chronic kidney disease: Secondary | ICD-10-CM | POA: Diagnosis not present

## 2016-07-04 DIAGNOSIS — E1129 Type 2 diabetes mellitus with other diabetic kidney complication: Secondary | ICD-10-CM | POA: Diagnosis not present

## 2016-07-04 DIAGNOSIS — N186 End stage renal disease: Secondary | ICD-10-CM | POA: Diagnosis not present

## 2016-07-04 DIAGNOSIS — D509 Iron deficiency anemia, unspecified: Secondary | ICD-10-CM | POA: Diagnosis not present

## 2016-07-04 DIAGNOSIS — N2581 Secondary hyperparathyroidism of renal origin: Secondary | ICD-10-CM | POA: Diagnosis not present

## 2016-07-07 DIAGNOSIS — N2581 Secondary hyperparathyroidism of renal origin: Secondary | ICD-10-CM | POA: Diagnosis not present

## 2016-07-07 DIAGNOSIS — D631 Anemia in chronic kidney disease: Secondary | ICD-10-CM | POA: Diagnosis not present

## 2016-07-07 DIAGNOSIS — N186 End stage renal disease: Secondary | ICD-10-CM | POA: Diagnosis not present

## 2016-07-07 DIAGNOSIS — D509 Iron deficiency anemia, unspecified: Secondary | ICD-10-CM | POA: Diagnosis not present

## 2016-07-07 DIAGNOSIS — E1129 Type 2 diabetes mellitus with other diabetic kidney complication: Secondary | ICD-10-CM | POA: Diagnosis not present

## 2016-07-09 DIAGNOSIS — E1129 Type 2 diabetes mellitus with other diabetic kidney complication: Secondary | ICD-10-CM | POA: Diagnosis not present

## 2016-07-09 DIAGNOSIS — N186 End stage renal disease: Secondary | ICD-10-CM | POA: Diagnosis not present

## 2016-07-09 DIAGNOSIS — D509 Iron deficiency anemia, unspecified: Secondary | ICD-10-CM | POA: Diagnosis not present

## 2016-07-09 DIAGNOSIS — N2581 Secondary hyperparathyroidism of renal origin: Secondary | ICD-10-CM | POA: Diagnosis not present

## 2016-07-09 DIAGNOSIS — D631 Anemia in chronic kidney disease: Secondary | ICD-10-CM | POA: Diagnosis not present

## 2016-07-11 DIAGNOSIS — E1129 Type 2 diabetes mellitus with other diabetic kidney complication: Secondary | ICD-10-CM | POA: Diagnosis not present

## 2016-07-11 DIAGNOSIS — D631 Anemia in chronic kidney disease: Secondary | ICD-10-CM | POA: Diagnosis not present

## 2016-07-11 DIAGNOSIS — N2581 Secondary hyperparathyroidism of renal origin: Secondary | ICD-10-CM | POA: Diagnosis not present

## 2016-07-11 DIAGNOSIS — D509 Iron deficiency anemia, unspecified: Secondary | ICD-10-CM | POA: Diagnosis not present

## 2016-07-11 DIAGNOSIS — N186 End stage renal disease: Secondary | ICD-10-CM | POA: Diagnosis not present

## 2016-07-14 DIAGNOSIS — D509 Iron deficiency anemia, unspecified: Secondary | ICD-10-CM | POA: Diagnosis not present

## 2016-07-14 DIAGNOSIS — E1129 Type 2 diabetes mellitus with other diabetic kidney complication: Secondary | ICD-10-CM | POA: Diagnosis not present

## 2016-07-14 DIAGNOSIS — D631 Anemia in chronic kidney disease: Secondary | ICD-10-CM | POA: Diagnosis not present

## 2016-07-14 DIAGNOSIS — N2581 Secondary hyperparathyroidism of renal origin: Secondary | ICD-10-CM | POA: Diagnosis not present

## 2016-07-14 DIAGNOSIS — N186 End stage renal disease: Secondary | ICD-10-CM | POA: Diagnosis not present

## 2016-07-15 DIAGNOSIS — I10 Essential (primary) hypertension: Secondary | ICD-10-CM | POA: Diagnosis not present

## 2016-07-15 DIAGNOSIS — G546 Phantom limb syndrome with pain: Secondary | ICD-10-CM | POA: Diagnosis not present

## 2016-07-15 DIAGNOSIS — N186 End stage renal disease: Secondary | ICD-10-CM | POA: Diagnosis not present

## 2016-07-16 DIAGNOSIS — D631 Anemia in chronic kidney disease: Secondary | ICD-10-CM | POA: Diagnosis not present

## 2016-07-16 DIAGNOSIS — N186 End stage renal disease: Secondary | ICD-10-CM | POA: Diagnosis not present

## 2016-07-16 DIAGNOSIS — D509 Iron deficiency anemia, unspecified: Secondary | ICD-10-CM | POA: Diagnosis not present

## 2016-07-16 DIAGNOSIS — N2581 Secondary hyperparathyroidism of renal origin: Secondary | ICD-10-CM | POA: Diagnosis not present

## 2016-07-16 DIAGNOSIS — E1129 Type 2 diabetes mellitus with other diabetic kidney complication: Secondary | ICD-10-CM | POA: Diagnosis not present

## 2016-07-18 DIAGNOSIS — E1129 Type 2 diabetes mellitus with other diabetic kidney complication: Secondary | ICD-10-CM | POA: Diagnosis not present

## 2016-07-18 DIAGNOSIS — N186 End stage renal disease: Secondary | ICD-10-CM | POA: Diagnosis not present

## 2016-07-18 DIAGNOSIS — D631 Anemia in chronic kidney disease: Secondary | ICD-10-CM | POA: Diagnosis not present

## 2016-07-18 DIAGNOSIS — D509 Iron deficiency anemia, unspecified: Secondary | ICD-10-CM | POA: Diagnosis not present

## 2016-07-18 DIAGNOSIS — N2581 Secondary hyperparathyroidism of renal origin: Secondary | ICD-10-CM | POA: Diagnosis not present

## 2016-07-19 DIAGNOSIS — N186 End stage renal disease: Secondary | ICD-10-CM | POA: Diagnosis not present

## 2016-07-19 DIAGNOSIS — Z992 Dependence on renal dialysis: Secondary | ICD-10-CM | POA: Diagnosis not present

## 2016-07-19 DIAGNOSIS — E1129 Type 2 diabetes mellitus with other diabetic kidney complication: Secondary | ICD-10-CM | POA: Diagnosis not present

## 2016-07-21 DIAGNOSIS — N2581 Secondary hyperparathyroidism of renal origin: Secondary | ICD-10-CM | POA: Diagnosis not present

## 2016-07-21 DIAGNOSIS — E1129 Type 2 diabetes mellitus with other diabetic kidney complication: Secondary | ICD-10-CM | POA: Diagnosis not present

## 2016-07-21 DIAGNOSIS — N186 End stage renal disease: Secondary | ICD-10-CM | POA: Diagnosis not present

## 2016-07-21 DIAGNOSIS — D509 Iron deficiency anemia, unspecified: Secondary | ICD-10-CM | POA: Diagnosis not present

## 2016-07-21 DIAGNOSIS — D631 Anemia in chronic kidney disease: Secondary | ICD-10-CM | POA: Diagnosis not present

## 2016-07-25 DIAGNOSIS — N2581 Secondary hyperparathyroidism of renal origin: Secondary | ICD-10-CM | POA: Diagnosis not present

## 2016-07-25 DIAGNOSIS — D631 Anemia in chronic kidney disease: Secondary | ICD-10-CM | POA: Diagnosis not present

## 2016-07-25 DIAGNOSIS — N186 End stage renal disease: Secondary | ICD-10-CM | POA: Diagnosis not present

## 2016-07-25 DIAGNOSIS — D509 Iron deficiency anemia, unspecified: Secondary | ICD-10-CM | POA: Diagnosis not present

## 2016-07-25 DIAGNOSIS — E1129 Type 2 diabetes mellitus with other diabetic kidney complication: Secondary | ICD-10-CM | POA: Diagnosis not present

## 2016-07-28 DIAGNOSIS — N2581 Secondary hyperparathyroidism of renal origin: Secondary | ICD-10-CM | POA: Diagnosis not present

## 2016-07-28 DIAGNOSIS — D631 Anemia in chronic kidney disease: Secondary | ICD-10-CM | POA: Diagnosis not present

## 2016-07-28 DIAGNOSIS — D509 Iron deficiency anemia, unspecified: Secondary | ICD-10-CM | POA: Diagnosis not present

## 2016-07-28 DIAGNOSIS — E1129 Type 2 diabetes mellitus with other diabetic kidney complication: Secondary | ICD-10-CM | POA: Diagnosis not present

## 2016-07-28 DIAGNOSIS — N186 End stage renal disease: Secondary | ICD-10-CM | POA: Diagnosis not present

## 2016-07-30 DIAGNOSIS — N186 End stage renal disease: Secondary | ICD-10-CM | POA: Diagnosis not present

## 2016-07-30 DIAGNOSIS — N2581 Secondary hyperparathyroidism of renal origin: Secondary | ICD-10-CM | POA: Diagnosis not present

## 2016-07-30 DIAGNOSIS — D631 Anemia in chronic kidney disease: Secondary | ICD-10-CM | POA: Diagnosis not present

## 2016-07-30 DIAGNOSIS — E1129 Type 2 diabetes mellitus with other diabetic kidney complication: Secondary | ICD-10-CM | POA: Diagnosis not present

## 2016-07-30 DIAGNOSIS — D509 Iron deficiency anemia, unspecified: Secondary | ICD-10-CM | POA: Diagnosis not present

## 2016-08-01 DIAGNOSIS — D631 Anemia in chronic kidney disease: Secondary | ICD-10-CM | POA: Diagnosis not present

## 2016-08-01 DIAGNOSIS — N186 End stage renal disease: Secondary | ICD-10-CM | POA: Diagnosis not present

## 2016-08-01 DIAGNOSIS — E1129 Type 2 diabetes mellitus with other diabetic kidney complication: Secondary | ICD-10-CM | POA: Diagnosis not present

## 2016-08-01 DIAGNOSIS — N2581 Secondary hyperparathyroidism of renal origin: Secondary | ICD-10-CM | POA: Diagnosis not present

## 2016-08-01 DIAGNOSIS — D509 Iron deficiency anemia, unspecified: Secondary | ICD-10-CM | POA: Diagnosis not present

## 2016-08-04 DIAGNOSIS — E1129 Type 2 diabetes mellitus with other diabetic kidney complication: Secondary | ICD-10-CM | POA: Diagnosis not present

## 2016-08-04 DIAGNOSIS — N186 End stage renal disease: Secondary | ICD-10-CM | POA: Diagnosis not present

## 2016-08-04 DIAGNOSIS — D631 Anemia in chronic kidney disease: Secondary | ICD-10-CM | POA: Diagnosis not present

## 2016-08-04 DIAGNOSIS — N2581 Secondary hyperparathyroidism of renal origin: Secondary | ICD-10-CM | POA: Diagnosis not present

## 2016-08-04 DIAGNOSIS — D509 Iron deficiency anemia, unspecified: Secondary | ICD-10-CM | POA: Diagnosis not present

## 2016-08-08 DIAGNOSIS — D509 Iron deficiency anemia, unspecified: Secondary | ICD-10-CM | POA: Diagnosis not present

## 2016-08-08 DIAGNOSIS — D631 Anemia in chronic kidney disease: Secondary | ICD-10-CM | POA: Diagnosis not present

## 2016-08-08 DIAGNOSIS — N186 End stage renal disease: Secondary | ICD-10-CM | POA: Diagnosis not present

## 2016-08-08 DIAGNOSIS — E1129 Type 2 diabetes mellitus with other diabetic kidney complication: Secondary | ICD-10-CM | POA: Diagnosis not present

## 2016-08-08 DIAGNOSIS — N2581 Secondary hyperparathyroidism of renal origin: Secondary | ICD-10-CM | POA: Diagnosis not present

## 2016-08-11 DIAGNOSIS — E1129 Type 2 diabetes mellitus with other diabetic kidney complication: Secondary | ICD-10-CM | POA: Diagnosis not present

## 2016-08-11 DIAGNOSIS — D631 Anemia in chronic kidney disease: Secondary | ICD-10-CM | POA: Diagnosis not present

## 2016-08-11 DIAGNOSIS — N186 End stage renal disease: Secondary | ICD-10-CM | POA: Diagnosis not present

## 2016-08-11 DIAGNOSIS — D509 Iron deficiency anemia, unspecified: Secondary | ICD-10-CM | POA: Diagnosis not present

## 2016-08-11 DIAGNOSIS — N2581 Secondary hyperparathyroidism of renal origin: Secondary | ICD-10-CM | POA: Diagnosis not present

## 2016-08-13 DIAGNOSIS — N186 End stage renal disease: Secondary | ICD-10-CM | POA: Diagnosis not present

## 2016-08-13 DIAGNOSIS — D509 Iron deficiency anemia, unspecified: Secondary | ICD-10-CM | POA: Diagnosis not present

## 2016-08-13 DIAGNOSIS — N2581 Secondary hyperparathyroidism of renal origin: Secondary | ICD-10-CM | POA: Diagnosis not present

## 2016-08-13 DIAGNOSIS — D631 Anemia in chronic kidney disease: Secondary | ICD-10-CM | POA: Diagnosis not present

## 2016-08-13 DIAGNOSIS — E1129 Type 2 diabetes mellitus with other diabetic kidney complication: Secondary | ICD-10-CM | POA: Diagnosis not present

## 2016-08-15 DIAGNOSIS — D509 Iron deficiency anemia, unspecified: Secondary | ICD-10-CM | POA: Diagnosis not present

## 2016-08-15 DIAGNOSIS — E1129 Type 2 diabetes mellitus with other diabetic kidney complication: Secondary | ICD-10-CM | POA: Diagnosis not present

## 2016-08-15 DIAGNOSIS — N186 End stage renal disease: Secondary | ICD-10-CM | POA: Diagnosis not present

## 2016-08-15 DIAGNOSIS — N2581 Secondary hyperparathyroidism of renal origin: Secondary | ICD-10-CM | POA: Diagnosis not present

## 2016-08-15 DIAGNOSIS — D631 Anemia in chronic kidney disease: Secondary | ICD-10-CM | POA: Diagnosis not present

## 2016-08-18 DIAGNOSIS — N186 End stage renal disease: Secondary | ICD-10-CM | POA: Diagnosis not present

## 2016-08-18 DIAGNOSIS — D631 Anemia in chronic kidney disease: Secondary | ICD-10-CM | POA: Diagnosis not present

## 2016-08-18 DIAGNOSIS — N2581 Secondary hyperparathyroidism of renal origin: Secondary | ICD-10-CM | POA: Diagnosis not present

## 2016-08-18 DIAGNOSIS — E1129 Type 2 diabetes mellitus with other diabetic kidney complication: Secondary | ICD-10-CM | POA: Diagnosis not present

## 2016-08-18 DIAGNOSIS — D509 Iron deficiency anemia, unspecified: Secondary | ICD-10-CM | POA: Diagnosis not present

## 2016-08-19 DIAGNOSIS — N186 End stage renal disease: Secondary | ICD-10-CM | POA: Diagnosis not present

## 2016-08-19 DIAGNOSIS — E1129 Type 2 diabetes mellitus with other diabetic kidney complication: Secondary | ICD-10-CM | POA: Diagnosis not present

## 2016-08-19 DIAGNOSIS — Z992 Dependence on renal dialysis: Secondary | ICD-10-CM | POA: Diagnosis not present

## 2016-08-20 DIAGNOSIS — N2581 Secondary hyperparathyroidism of renal origin: Secondary | ICD-10-CM | POA: Diagnosis not present

## 2016-08-20 DIAGNOSIS — N186 End stage renal disease: Secondary | ICD-10-CM | POA: Diagnosis not present

## 2016-08-20 DIAGNOSIS — E1129 Type 2 diabetes mellitus with other diabetic kidney complication: Secondary | ICD-10-CM | POA: Diagnosis not present

## 2016-08-20 DIAGNOSIS — D631 Anemia in chronic kidney disease: Secondary | ICD-10-CM | POA: Diagnosis not present

## 2016-08-20 DIAGNOSIS — D509 Iron deficiency anemia, unspecified: Secondary | ICD-10-CM | POA: Diagnosis not present

## 2016-08-22 DIAGNOSIS — D509 Iron deficiency anemia, unspecified: Secondary | ICD-10-CM | POA: Diagnosis not present

## 2016-08-22 DIAGNOSIS — N186 End stage renal disease: Secondary | ICD-10-CM | POA: Diagnosis not present

## 2016-08-22 DIAGNOSIS — E1129 Type 2 diabetes mellitus with other diabetic kidney complication: Secondary | ICD-10-CM | POA: Diagnosis not present

## 2016-08-22 DIAGNOSIS — D631 Anemia in chronic kidney disease: Secondary | ICD-10-CM | POA: Diagnosis not present

## 2016-08-22 DIAGNOSIS — N2581 Secondary hyperparathyroidism of renal origin: Secondary | ICD-10-CM | POA: Diagnosis not present

## 2016-08-25 DIAGNOSIS — D631 Anemia in chronic kidney disease: Secondary | ICD-10-CM | POA: Diagnosis not present

## 2016-08-25 DIAGNOSIS — N2581 Secondary hyperparathyroidism of renal origin: Secondary | ICD-10-CM | POA: Diagnosis not present

## 2016-08-25 DIAGNOSIS — E1129 Type 2 diabetes mellitus with other diabetic kidney complication: Secondary | ICD-10-CM | POA: Diagnosis not present

## 2016-08-25 DIAGNOSIS — N186 End stage renal disease: Secondary | ICD-10-CM | POA: Diagnosis not present

## 2016-08-25 DIAGNOSIS — D509 Iron deficiency anemia, unspecified: Secondary | ICD-10-CM | POA: Diagnosis not present

## 2016-08-27 DIAGNOSIS — N186 End stage renal disease: Secondary | ICD-10-CM | POA: Diagnosis not present

## 2016-08-27 DIAGNOSIS — E1129 Type 2 diabetes mellitus with other diabetic kidney complication: Secondary | ICD-10-CM | POA: Diagnosis not present

## 2016-08-27 DIAGNOSIS — N2581 Secondary hyperparathyroidism of renal origin: Secondary | ICD-10-CM | POA: Diagnosis not present

## 2016-08-27 DIAGNOSIS — D509 Iron deficiency anemia, unspecified: Secondary | ICD-10-CM | POA: Diagnosis not present

## 2016-08-27 DIAGNOSIS — D631 Anemia in chronic kidney disease: Secondary | ICD-10-CM | POA: Diagnosis not present

## 2016-08-29 DIAGNOSIS — D631 Anemia in chronic kidney disease: Secondary | ICD-10-CM | POA: Diagnosis not present

## 2016-08-29 DIAGNOSIS — N2581 Secondary hyperparathyroidism of renal origin: Secondary | ICD-10-CM | POA: Diagnosis not present

## 2016-08-29 DIAGNOSIS — E1129 Type 2 diabetes mellitus with other diabetic kidney complication: Secondary | ICD-10-CM | POA: Diagnosis not present

## 2016-08-29 DIAGNOSIS — N186 End stage renal disease: Secondary | ICD-10-CM | POA: Diagnosis not present

## 2016-08-29 DIAGNOSIS — D509 Iron deficiency anemia, unspecified: Secondary | ICD-10-CM | POA: Diagnosis not present

## 2016-09-01 DIAGNOSIS — D631 Anemia in chronic kidney disease: Secondary | ICD-10-CM | POA: Diagnosis not present

## 2016-09-01 DIAGNOSIS — N2581 Secondary hyperparathyroidism of renal origin: Secondary | ICD-10-CM | POA: Diagnosis not present

## 2016-09-01 DIAGNOSIS — D509 Iron deficiency anemia, unspecified: Secondary | ICD-10-CM | POA: Diagnosis not present

## 2016-09-01 DIAGNOSIS — N186 End stage renal disease: Secondary | ICD-10-CM | POA: Diagnosis not present

## 2016-09-01 DIAGNOSIS — E1129 Type 2 diabetes mellitus with other diabetic kidney complication: Secondary | ICD-10-CM | POA: Diagnosis not present

## 2016-09-03 ENCOUNTER — Ambulatory Visit (INDEPENDENT_AMBULATORY_CARE_PROVIDER_SITE_OTHER): Payer: Medicare Other | Admitting: Orthopedic Surgery

## 2016-09-03 DIAGNOSIS — E1129 Type 2 diabetes mellitus with other diabetic kidney complication: Secondary | ICD-10-CM | POA: Diagnosis not present

## 2016-09-03 DIAGNOSIS — N2581 Secondary hyperparathyroidism of renal origin: Secondary | ICD-10-CM | POA: Diagnosis not present

## 2016-09-03 DIAGNOSIS — D509 Iron deficiency anemia, unspecified: Secondary | ICD-10-CM | POA: Diagnosis not present

## 2016-09-03 DIAGNOSIS — D631 Anemia in chronic kidney disease: Secondary | ICD-10-CM | POA: Diagnosis not present

## 2016-09-03 DIAGNOSIS — N186 End stage renal disease: Secondary | ICD-10-CM | POA: Diagnosis not present

## 2016-09-05 DIAGNOSIS — D509 Iron deficiency anemia, unspecified: Secondary | ICD-10-CM | POA: Diagnosis not present

## 2016-09-05 DIAGNOSIS — N2581 Secondary hyperparathyroidism of renal origin: Secondary | ICD-10-CM | POA: Diagnosis not present

## 2016-09-05 DIAGNOSIS — N186 End stage renal disease: Secondary | ICD-10-CM | POA: Diagnosis not present

## 2016-09-05 DIAGNOSIS — E1129 Type 2 diabetes mellitus with other diabetic kidney complication: Secondary | ICD-10-CM | POA: Diagnosis not present

## 2016-09-05 DIAGNOSIS — D631 Anemia in chronic kidney disease: Secondary | ICD-10-CM | POA: Diagnosis not present

## 2016-09-08 DIAGNOSIS — N2581 Secondary hyperparathyroidism of renal origin: Secondary | ICD-10-CM | POA: Diagnosis not present

## 2016-09-08 DIAGNOSIS — D631 Anemia in chronic kidney disease: Secondary | ICD-10-CM | POA: Diagnosis not present

## 2016-09-08 DIAGNOSIS — E1129 Type 2 diabetes mellitus with other diabetic kidney complication: Secondary | ICD-10-CM | POA: Diagnosis not present

## 2016-09-08 DIAGNOSIS — N186 End stage renal disease: Secondary | ICD-10-CM | POA: Diagnosis not present

## 2016-09-08 DIAGNOSIS — D509 Iron deficiency anemia, unspecified: Secondary | ICD-10-CM | POA: Diagnosis not present

## 2016-09-10 DIAGNOSIS — D509 Iron deficiency anemia, unspecified: Secondary | ICD-10-CM | POA: Diagnosis not present

## 2016-09-10 DIAGNOSIS — D631 Anemia in chronic kidney disease: Secondary | ICD-10-CM | POA: Diagnosis not present

## 2016-09-10 DIAGNOSIS — N2581 Secondary hyperparathyroidism of renal origin: Secondary | ICD-10-CM | POA: Diagnosis not present

## 2016-09-10 DIAGNOSIS — E1129 Type 2 diabetes mellitus with other diabetic kidney complication: Secondary | ICD-10-CM | POA: Diagnosis not present

## 2016-09-10 DIAGNOSIS — N186 End stage renal disease: Secondary | ICD-10-CM | POA: Diagnosis not present

## 2016-09-12 DIAGNOSIS — N2581 Secondary hyperparathyroidism of renal origin: Secondary | ICD-10-CM | POA: Diagnosis not present

## 2016-09-12 DIAGNOSIS — N186 End stage renal disease: Secondary | ICD-10-CM | POA: Diagnosis not present

## 2016-09-12 DIAGNOSIS — D509 Iron deficiency anemia, unspecified: Secondary | ICD-10-CM | POA: Diagnosis not present

## 2016-09-12 DIAGNOSIS — D631 Anemia in chronic kidney disease: Secondary | ICD-10-CM | POA: Diagnosis not present

## 2016-09-12 DIAGNOSIS — E1129 Type 2 diabetes mellitus with other diabetic kidney complication: Secondary | ICD-10-CM | POA: Diagnosis not present

## 2016-09-15 DIAGNOSIS — N186 End stage renal disease: Secondary | ICD-10-CM | POA: Diagnosis not present

## 2016-09-15 DIAGNOSIS — D509 Iron deficiency anemia, unspecified: Secondary | ICD-10-CM | POA: Diagnosis not present

## 2016-09-15 DIAGNOSIS — D631 Anemia in chronic kidney disease: Secondary | ICD-10-CM | POA: Diagnosis not present

## 2016-09-15 DIAGNOSIS — E1129 Type 2 diabetes mellitus with other diabetic kidney complication: Secondary | ICD-10-CM | POA: Diagnosis not present

## 2016-09-15 DIAGNOSIS — N2581 Secondary hyperparathyroidism of renal origin: Secondary | ICD-10-CM | POA: Diagnosis not present

## 2016-09-16 DIAGNOSIS — Z992 Dependence on renal dialysis: Secondary | ICD-10-CM | POA: Diagnosis not present

## 2016-09-16 DIAGNOSIS — E1129 Type 2 diabetes mellitus with other diabetic kidney complication: Secondary | ICD-10-CM | POA: Diagnosis not present

## 2016-09-16 DIAGNOSIS — N186 End stage renal disease: Secondary | ICD-10-CM | POA: Diagnosis not present

## 2016-09-17 DIAGNOSIS — D631 Anemia in chronic kidney disease: Secondary | ICD-10-CM | POA: Diagnosis not present

## 2016-09-17 DIAGNOSIS — N186 End stage renal disease: Secondary | ICD-10-CM | POA: Diagnosis not present

## 2016-09-17 DIAGNOSIS — E1129 Type 2 diabetes mellitus with other diabetic kidney complication: Secondary | ICD-10-CM | POA: Diagnosis not present

## 2016-09-17 DIAGNOSIS — N2581 Secondary hyperparathyroidism of renal origin: Secondary | ICD-10-CM | POA: Diagnosis not present

## 2016-09-17 DIAGNOSIS — D509 Iron deficiency anemia, unspecified: Secondary | ICD-10-CM | POA: Diagnosis not present

## 2016-09-19 DIAGNOSIS — N186 End stage renal disease: Secondary | ICD-10-CM | POA: Diagnosis not present

## 2016-09-19 DIAGNOSIS — E1129 Type 2 diabetes mellitus with other diabetic kidney complication: Secondary | ICD-10-CM | POA: Diagnosis not present

## 2016-09-19 DIAGNOSIS — D631 Anemia in chronic kidney disease: Secondary | ICD-10-CM | POA: Diagnosis not present

## 2016-09-19 DIAGNOSIS — N2581 Secondary hyperparathyroidism of renal origin: Secondary | ICD-10-CM | POA: Diagnosis not present

## 2016-09-19 DIAGNOSIS — D509 Iron deficiency anemia, unspecified: Secondary | ICD-10-CM | POA: Diagnosis not present

## 2016-09-22 DIAGNOSIS — E1129 Type 2 diabetes mellitus with other diabetic kidney complication: Secondary | ICD-10-CM | POA: Diagnosis not present

## 2016-09-22 DIAGNOSIS — N186 End stage renal disease: Secondary | ICD-10-CM | POA: Diagnosis not present

## 2016-09-22 DIAGNOSIS — N2581 Secondary hyperparathyroidism of renal origin: Secondary | ICD-10-CM | POA: Diagnosis not present

## 2016-09-22 DIAGNOSIS — D509 Iron deficiency anemia, unspecified: Secondary | ICD-10-CM | POA: Diagnosis not present

## 2016-09-22 DIAGNOSIS — D631 Anemia in chronic kidney disease: Secondary | ICD-10-CM | POA: Diagnosis not present

## 2016-09-24 DIAGNOSIS — D631 Anemia in chronic kidney disease: Secondary | ICD-10-CM | POA: Diagnosis not present

## 2016-09-24 DIAGNOSIS — E1129 Type 2 diabetes mellitus with other diabetic kidney complication: Secondary | ICD-10-CM | POA: Diagnosis not present

## 2016-09-24 DIAGNOSIS — N2581 Secondary hyperparathyroidism of renal origin: Secondary | ICD-10-CM | POA: Diagnosis not present

## 2016-09-24 DIAGNOSIS — N186 End stage renal disease: Secondary | ICD-10-CM | POA: Diagnosis not present

## 2016-09-24 DIAGNOSIS — D509 Iron deficiency anemia, unspecified: Secondary | ICD-10-CM | POA: Diagnosis not present

## 2016-09-26 DIAGNOSIS — N2581 Secondary hyperparathyroidism of renal origin: Secondary | ICD-10-CM | POA: Diagnosis not present

## 2016-09-26 DIAGNOSIS — D631 Anemia in chronic kidney disease: Secondary | ICD-10-CM | POA: Diagnosis not present

## 2016-09-26 DIAGNOSIS — D509 Iron deficiency anemia, unspecified: Secondary | ICD-10-CM | POA: Diagnosis not present

## 2016-09-26 DIAGNOSIS — N186 End stage renal disease: Secondary | ICD-10-CM | POA: Diagnosis not present

## 2016-09-26 DIAGNOSIS — E1129 Type 2 diabetes mellitus with other diabetic kidney complication: Secondary | ICD-10-CM | POA: Diagnosis not present

## 2016-09-29 DIAGNOSIS — E1129 Type 2 diabetes mellitus with other diabetic kidney complication: Secondary | ICD-10-CM | POA: Diagnosis not present

## 2016-09-29 DIAGNOSIS — D631 Anemia in chronic kidney disease: Secondary | ICD-10-CM | POA: Diagnosis not present

## 2016-09-29 DIAGNOSIS — N186 End stage renal disease: Secondary | ICD-10-CM | POA: Diagnosis not present

## 2016-09-29 DIAGNOSIS — N2581 Secondary hyperparathyroidism of renal origin: Secondary | ICD-10-CM | POA: Diagnosis not present

## 2016-09-29 DIAGNOSIS — D509 Iron deficiency anemia, unspecified: Secondary | ICD-10-CM | POA: Diagnosis not present

## 2016-10-01 DIAGNOSIS — N2581 Secondary hyperparathyroidism of renal origin: Secondary | ICD-10-CM | POA: Diagnosis not present

## 2016-10-01 DIAGNOSIS — D509 Iron deficiency anemia, unspecified: Secondary | ICD-10-CM | POA: Diagnosis not present

## 2016-10-01 DIAGNOSIS — N186 End stage renal disease: Secondary | ICD-10-CM | POA: Diagnosis not present

## 2016-10-01 DIAGNOSIS — E1129 Type 2 diabetes mellitus with other diabetic kidney complication: Secondary | ICD-10-CM | POA: Diagnosis not present

## 2016-10-01 DIAGNOSIS — D631 Anemia in chronic kidney disease: Secondary | ICD-10-CM | POA: Diagnosis not present

## 2016-10-03 DIAGNOSIS — N186 End stage renal disease: Secondary | ICD-10-CM | POA: Diagnosis not present

## 2016-10-03 DIAGNOSIS — N2581 Secondary hyperparathyroidism of renal origin: Secondary | ICD-10-CM | POA: Diagnosis not present

## 2016-10-03 DIAGNOSIS — E1129 Type 2 diabetes mellitus with other diabetic kidney complication: Secondary | ICD-10-CM | POA: Diagnosis not present

## 2016-10-03 DIAGNOSIS — D509 Iron deficiency anemia, unspecified: Secondary | ICD-10-CM | POA: Diagnosis not present

## 2016-10-03 DIAGNOSIS — D631 Anemia in chronic kidney disease: Secondary | ICD-10-CM | POA: Diagnosis not present

## 2016-10-06 DIAGNOSIS — E1129 Type 2 diabetes mellitus with other diabetic kidney complication: Secondary | ICD-10-CM | POA: Diagnosis not present

## 2016-10-06 DIAGNOSIS — N186 End stage renal disease: Secondary | ICD-10-CM | POA: Diagnosis not present

## 2016-10-06 DIAGNOSIS — D509 Iron deficiency anemia, unspecified: Secondary | ICD-10-CM | POA: Diagnosis not present

## 2016-10-06 DIAGNOSIS — N2581 Secondary hyperparathyroidism of renal origin: Secondary | ICD-10-CM | POA: Diagnosis not present

## 2016-10-06 DIAGNOSIS — D631 Anemia in chronic kidney disease: Secondary | ICD-10-CM | POA: Diagnosis not present

## 2016-10-08 DIAGNOSIS — N2581 Secondary hyperparathyroidism of renal origin: Secondary | ICD-10-CM | POA: Diagnosis not present

## 2016-10-08 DIAGNOSIS — D631 Anemia in chronic kidney disease: Secondary | ICD-10-CM | POA: Diagnosis not present

## 2016-10-08 DIAGNOSIS — E1129 Type 2 diabetes mellitus with other diabetic kidney complication: Secondary | ICD-10-CM | POA: Diagnosis not present

## 2016-10-08 DIAGNOSIS — N186 End stage renal disease: Secondary | ICD-10-CM | POA: Diagnosis not present

## 2016-10-08 DIAGNOSIS — D509 Iron deficiency anemia, unspecified: Secondary | ICD-10-CM | POA: Diagnosis not present

## 2016-10-10 DIAGNOSIS — E1129 Type 2 diabetes mellitus with other diabetic kidney complication: Secondary | ICD-10-CM | POA: Diagnosis not present

## 2016-10-10 DIAGNOSIS — N2581 Secondary hyperparathyroidism of renal origin: Secondary | ICD-10-CM | POA: Diagnosis not present

## 2016-10-10 DIAGNOSIS — D509 Iron deficiency anemia, unspecified: Secondary | ICD-10-CM | POA: Diagnosis not present

## 2016-10-10 DIAGNOSIS — N186 End stage renal disease: Secondary | ICD-10-CM | POA: Diagnosis not present

## 2016-10-10 DIAGNOSIS — D631 Anemia in chronic kidney disease: Secondary | ICD-10-CM | POA: Diagnosis not present

## 2016-10-12 DIAGNOSIS — I10 Essential (primary) hypertension: Secondary | ICD-10-CM | POA: Diagnosis not present

## 2016-10-12 DIAGNOSIS — G546 Phantom limb syndrome with pain: Secondary | ICD-10-CM | POA: Diagnosis not present

## 2016-10-12 DIAGNOSIS — N186 End stage renal disease: Secondary | ICD-10-CM | POA: Diagnosis not present

## 2016-10-12 DIAGNOSIS — E1122 Type 2 diabetes mellitus with diabetic chronic kidney disease: Secondary | ICD-10-CM | POA: Diagnosis not present

## 2016-10-13 DIAGNOSIS — N2581 Secondary hyperparathyroidism of renal origin: Secondary | ICD-10-CM | POA: Diagnosis not present

## 2016-10-13 DIAGNOSIS — N186 End stage renal disease: Secondary | ICD-10-CM | POA: Diagnosis not present

## 2016-10-13 DIAGNOSIS — D509 Iron deficiency anemia, unspecified: Secondary | ICD-10-CM | POA: Diagnosis not present

## 2016-10-13 DIAGNOSIS — E1129 Type 2 diabetes mellitus with other diabetic kidney complication: Secondary | ICD-10-CM | POA: Diagnosis not present

## 2016-10-13 DIAGNOSIS — D631 Anemia in chronic kidney disease: Secondary | ICD-10-CM | POA: Diagnosis not present

## 2016-10-15 DIAGNOSIS — E1129 Type 2 diabetes mellitus with other diabetic kidney complication: Secondary | ICD-10-CM | POA: Diagnosis not present

## 2016-10-15 DIAGNOSIS — D509 Iron deficiency anemia, unspecified: Secondary | ICD-10-CM | POA: Diagnosis not present

## 2016-10-15 DIAGNOSIS — N2581 Secondary hyperparathyroidism of renal origin: Secondary | ICD-10-CM | POA: Diagnosis not present

## 2016-10-15 DIAGNOSIS — N186 End stage renal disease: Secondary | ICD-10-CM | POA: Diagnosis not present

## 2016-10-15 DIAGNOSIS — D631 Anemia in chronic kidney disease: Secondary | ICD-10-CM | POA: Diagnosis not present

## 2016-10-17 DIAGNOSIS — D631 Anemia in chronic kidney disease: Secondary | ICD-10-CM | POA: Diagnosis not present

## 2016-10-17 DIAGNOSIS — E1129 Type 2 diabetes mellitus with other diabetic kidney complication: Secondary | ICD-10-CM | POA: Diagnosis not present

## 2016-10-17 DIAGNOSIS — Z992 Dependence on renal dialysis: Secondary | ICD-10-CM | POA: Diagnosis not present

## 2016-10-17 DIAGNOSIS — N2581 Secondary hyperparathyroidism of renal origin: Secondary | ICD-10-CM | POA: Diagnosis not present

## 2016-10-17 DIAGNOSIS — D509 Iron deficiency anemia, unspecified: Secondary | ICD-10-CM | POA: Diagnosis not present

## 2016-10-17 DIAGNOSIS — N186 End stage renal disease: Secondary | ICD-10-CM | POA: Diagnosis not present

## 2016-10-20 DIAGNOSIS — E1129 Type 2 diabetes mellitus with other diabetic kidney complication: Secondary | ICD-10-CM | POA: Diagnosis not present

## 2016-10-20 DIAGNOSIS — N186 End stage renal disease: Secondary | ICD-10-CM | POA: Diagnosis not present

## 2016-10-20 DIAGNOSIS — D631 Anemia in chronic kidney disease: Secondary | ICD-10-CM | POA: Diagnosis not present

## 2016-10-20 DIAGNOSIS — N2581 Secondary hyperparathyroidism of renal origin: Secondary | ICD-10-CM | POA: Diagnosis not present

## 2016-10-20 DIAGNOSIS — D509 Iron deficiency anemia, unspecified: Secondary | ICD-10-CM | POA: Diagnosis not present

## 2016-10-22 DIAGNOSIS — E1129 Type 2 diabetes mellitus with other diabetic kidney complication: Secondary | ICD-10-CM | POA: Diagnosis not present

## 2016-10-22 DIAGNOSIS — N186 End stage renal disease: Secondary | ICD-10-CM | POA: Diagnosis not present

## 2016-10-22 DIAGNOSIS — D631 Anemia in chronic kidney disease: Secondary | ICD-10-CM | POA: Diagnosis not present

## 2016-10-22 DIAGNOSIS — N2581 Secondary hyperparathyroidism of renal origin: Secondary | ICD-10-CM | POA: Diagnosis not present

## 2016-10-22 DIAGNOSIS — D509 Iron deficiency anemia, unspecified: Secondary | ICD-10-CM | POA: Diagnosis not present

## 2016-10-24 DIAGNOSIS — N186 End stage renal disease: Secondary | ICD-10-CM | POA: Diagnosis not present

## 2016-10-24 DIAGNOSIS — E1129 Type 2 diabetes mellitus with other diabetic kidney complication: Secondary | ICD-10-CM | POA: Diagnosis not present

## 2016-10-24 DIAGNOSIS — D631 Anemia in chronic kidney disease: Secondary | ICD-10-CM | POA: Diagnosis not present

## 2016-10-24 DIAGNOSIS — N2581 Secondary hyperparathyroidism of renal origin: Secondary | ICD-10-CM | POA: Diagnosis not present

## 2016-10-24 DIAGNOSIS — D509 Iron deficiency anemia, unspecified: Secondary | ICD-10-CM | POA: Diagnosis not present

## 2016-10-27 DIAGNOSIS — N2581 Secondary hyperparathyroidism of renal origin: Secondary | ICD-10-CM | POA: Diagnosis not present

## 2016-10-27 DIAGNOSIS — D509 Iron deficiency anemia, unspecified: Secondary | ICD-10-CM | POA: Diagnosis not present

## 2016-10-27 DIAGNOSIS — E1129 Type 2 diabetes mellitus with other diabetic kidney complication: Secondary | ICD-10-CM | POA: Diagnosis not present

## 2016-10-27 DIAGNOSIS — N186 End stage renal disease: Secondary | ICD-10-CM | POA: Diagnosis not present

## 2016-10-27 DIAGNOSIS — D631 Anemia in chronic kidney disease: Secondary | ICD-10-CM | POA: Diagnosis not present

## 2016-10-29 DIAGNOSIS — D509 Iron deficiency anemia, unspecified: Secondary | ICD-10-CM | POA: Diagnosis not present

## 2016-10-29 DIAGNOSIS — E1129 Type 2 diabetes mellitus with other diabetic kidney complication: Secondary | ICD-10-CM | POA: Diagnosis not present

## 2016-10-29 DIAGNOSIS — N2581 Secondary hyperparathyroidism of renal origin: Secondary | ICD-10-CM | POA: Diagnosis not present

## 2016-10-29 DIAGNOSIS — D631 Anemia in chronic kidney disease: Secondary | ICD-10-CM | POA: Diagnosis not present

## 2016-10-29 DIAGNOSIS — N186 End stage renal disease: Secondary | ICD-10-CM | POA: Diagnosis not present

## 2016-10-31 DIAGNOSIS — N186 End stage renal disease: Secondary | ICD-10-CM | POA: Diagnosis not present

## 2016-10-31 DIAGNOSIS — E1129 Type 2 diabetes mellitus with other diabetic kidney complication: Secondary | ICD-10-CM | POA: Diagnosis not present

## 2016-10-31 DIAGNOSIS — D631 Anemia in chronic kidney disease: Secondary | ICD-10-CM | POA: Diagnosis not present

## 2016-10-31 DIAGNOSIS — N2581 Secondary hyperparathyroidism of renal origin: Secondary | ICD-10-CM | POA: Diagnosis not present

## 2016-10-31 DIAGNOSIS — D509 Iron deficiency anemia, unspecified: Secondary | ICD-10-CM | POA: Diagnosis not present

## 2016-11-02 DIAGNOSIS — T82858A Stenosis of vascular prosthetic devices, implants and grafts, initial encounter: Secondary | ICD-10-CM | POA: Diagnosis not present

## 2016-11-02 DIAGNOSIS — I871 Compression of vein: Secondary | ICD-10-CM | POA: Diagnosis not present

## 2016-11-02 DIAGNOSIS — N186 End stage renal disease: Secondary | ICD-10-CM | POA: Diagnosis not present

## 2016-11-02 DIAGNOSIS — Z992 Dependence on renal dialysis: Secondary | ICD-10-CM | POA: Diagnosis not present

## 2016-11-03 DIAGNOSIS — N186 End stage renal disease: Secondary | ICD-10-CM | POA: Diagnosis not present

## 2016-11-03 DIAGNOSIS — D631 Anemia in chronic kidney disease: Secondary | ICD-10-CM | POA: Diagnosis not present

## 2016-11-03 DIAGNOSIS — D509 Iron deficiency anemia, unspecified: Secondary | ICD-10-CM | POA: Diagnosis not present

## 2016-11-03 DIAGNOSIS — N2581 Secondary hyperparathyroidism of renal origin: Secondary | ICD-10-CM | POA: Diagnosis not present

## 2016-11-03 DIAGNOSIS — E1129 Type 2 diabetes mellitus with other diabetic kidney complication: Secondary | ICD-10-CM | POA: Diagnosis not present

## 2016-11-05 DIAGNOSIS — N186 End stage renal disease: Secondary | ICD-10-CM | POA: Diagnosis not present

## 2016-11-05 DIAGNOSIS — N2581 Secondary hyperparathyroidism of renal origin: Secondary | ICD-10-CM | POA: Diagnosis not present

## 2016-11-05 DIAGNOSIS — D631 Anemia in chronic kidney disease: Secondary | ICD-10-CM | POA: Diagnosis not present

## 2016-11-05 DIAGNOSIS — D509 Iron deficiency anemia, unspecified: Secondary | ICD-10-CM | POA: Diagnosis not present

## 2016-11-05 DIAGNOSIS — E1129 Type 2 diabetes mellitus with other diabetic kidney complication: Secondary | ICD-10-CM | POA: Diagnosis not present

## 2016-11-07 DIAGNOSIS — D631 Anemia in chronic kidney disease: Secondary | ICD-10-CM | POA: Diagnosis not present

## 2016-11-07 DIAGNOSIS — N186 End stage renal disease: Secondary | ICD-10-CM | POA: Diagnosis not present

## 2016-11-07 DIAGNOSIS — E1129 Type 2 diabetes mellitus with other diabetic kidney complication: Secondary | ICD-10-CM | POA: Diagnosis not present

## 2016-11-07 DIAGNOSIS — N2581 Secondary hyperparathyroidism of renal origin: Secondary | ICD-10-CM | POA: Diagnosis not present

## 2016-11-07 DIAGNOSIS — D509 Iron deficiency anemia, unspecified: Secondary | ICD-10-CM | POA: Diagnosis not present

## 2016-11-10 DIAGNOSIS — D631 Anemia in chronic kidney disease: Secondary | ICD-10-CM | POA: Diagnosis not present

## 2016-11-10 DIAGNOSIS — D509 Iron deficiency anemia, unspecified: Secondary | ICD-10-CM | POA: Diagnosis not present

## 2016-11-10 DIAGNOSIS — N186 End stage renal disease: Secondary | ICD-10-CM | POA: Diagnosis not present

## 2016-11-10 DIAGNOSIS — E1129 Type 2 diabetes mellitus with other diabetic kidney complication: Secondary | ICD-10-CM | POA: Diagnosis not present

## 2016-11-10 DIAGNOSIS — N2581 Secondary hyperparathyroidism of renal origin: Secondary | ICD-10-CM | POA: Diagnosis not present

## 2016-11-12 DIAGNOSIS — D631 Anemia in chronic kidney disease: Secondary | ICD-10-CM | POA: Diagnosis not present

## 2016-11-12 DIAGNOSIS — N2581 Secondary hyperparathyroidism of renal origin: Secondary | ICD-10-CM | POA: Diagnosis not present

## 2016-11-12 DIAGNOSIS — N186 End stage renal disease: Secondary | ICD-10-CM | POA: Diagnosis not present

## 2016-11-12 DIAGNOSIS — E1129 Type 2 diabetes mellitus with other diabetic kidney complication: Secondary | ICD-10-CM | POA: Diagnosis not present

## 2016-11-12 DIAGNOSIS — D509 Iron deficiency anemia, unspecified: Secondary | ICD-10-CM | POA: Diagnosis not present

## 2016-11-14 DIAGNOSIS — N2581 Secondary hyperparathyroidism of renal origin: Secondary | ICD-10-CM | POA: Diagnosis not present

## 2016-11-14 DIAGNOSIS — D631 Anemia in chronic kidney disease: Secondary | ICD-10-CM | POA: Diagnosis not present

## 2016-11-14 DIAGNOSIS — N186 End stage renal disease: Secondary | ICD-10-CM | POA: Diagnosis not present

## 2016-11-14 DIAGNOSIS — D509 Iron deficiency anemia, unspecified: Secondary | ICD-10-CM | POA: Diagnosis not present

## 2016-11-14 DIAGNOSIS — E1129 Type 2 diabetes mellitus with other diabetic kidney complication: Secondary | ICD-10-CM | POA: Diagnosis not present

## 2016-11-16 DIAGNOSIS — Z992 Dependence on renal dialysis: Secondary | ICD-10-CM | POA: Diagnosis not present

## 2016-11-16 DIAGNOSIS — N186 End stage renal disease: Secondary | ICD-10-CM | POA: Diagnosis not present

## 2016-11-16 DIAGNOSIS — E1129 Type 2 diabetes mellitus with other diabetic kidney complication: Secondary | ICD-10-CM | POA: Diagnosis not present

## 2016-11-17 DIAGNOSIS — D509 Iron deficiency anemia, unspecified: Secondary | ICD-10-CM | POA: Diagnosis not present

## 2016-11-17 DIAGNOSIS — D631 Anemia in chronic kidney disease: Secondary | ICD-10-CM | POA: Diagnosis not present

## 2016-11-17 DIAGNOSIS — N186 End stage renal disease: Secondary | ICD-10-CM | POA: Diagnosis not present

## 2016-11-17 DIAGNOSIS — E1129 Type 2 diabetes mellitus with other diabetic kidney complication: Secondary | ICD-10-CM | POA: Diagnosis not present

## 2016-11-17 DIAGNOSIS — N2581 Secondary hyperparathyroidism of renal origin: Secondary | ICD-10-CM | POA: Diagnosis not present

## 2016-11-19 DIAGNOSIS — N186 End stage renal disease: Secondary | ICD-10-CM | POA: Diagnosis not present

## 2016-11-19 DIAGNOSIS — D509 Iron deficiency anemia, unspecified: Secondary | ICD-10-CM | POA: Diagnosis not present

## 2016-11-19 DIAGNOSIS — N2581 Secondary hyperparathyroidism of renal origin: Secondary | ICD-10-CM | POA: Diagnosis not present

## 2016-11-19 DIAGNOSIS — E1129 Type 2 diabetes mellitus with other diabetic kidney complication: Secondary | ICD-10-CM | POA: Diagnosis not present

## 2016-11-19 DIAGNOSIS — D631 Anemia in chronic kidney disease: Secondary | ICD-10-CM | POA: Diagnosis not present

## 2016-11-21 DIAGNOSIS — N186 End stage renal disease: Secondary | ICD-10-CM | POA: Diagnosis not present

## 2016-11-21 DIAGNOSIS — E1129 Type 2 diabetes mellitus with other diabetic kidney complication: Secondary | ICD-10-CM | POA: Diagnosis not present

## 2016-11-21 DIAGNOSIS — D509 Iron deficiency anemia, unspecified: Secondary | ICD-10-CM | POA: Diagnosis not present

## 2016-11-21 DIAGNOSIS — D631 Anemia in chronic kidney disease: Secondary | ICD-10-CM | POA: Diagnosis not present

## 2016-11-21 DIAGNOSIS — N2581 Secondary hyperparathyroidism of renal origin: Secondary | ICD-10-CM | POA: Diagnosis not present

## 2016-11-24 DIAGNOSIS — D509 Iron deficiency anemia, unspecified: Secondary | ICD-10-CM | POA: Diagnosis not present

## 2016-11-24 DIAGNOSIS — N2581 Secondary hyperparathyroidism of renal origin: Secondary | ICD-10-CM | POA: Diagnosis not present

## 2016-11-24 DIAGNOSIS — E1129 Type 2 diabetes mellitus with other diabetic kidney complication: Secondary | ICD-10-CM | POA: Diagnosis not present

## 2016-11-24 DIAGNOSIS — N186 End stage renal disease: Secondary | ICD-10-CM | POA: Diagnosis not present

## 2016-11-24 DIAGNOSIS — D631 Anemia in chronic kidney disease: Secondary | ICD-10-CM | POA: Diagnosis not present

## 2016-11-26 DIAGNOSIS — D509 Iron deficiency anemia, unspecified: Secondary | ICD-10-CM | POA: Diagnosis not present

## 2016-11-26 DIAGNOSIS — N2581 Secondary hyperparathyroidism of renal origin: Secondary | ICD-10-CM | POA: Diagnosis not present

## 2016-11-26 DIAGNOSIS — N186 End stage renal disease: Secondary | ICD-10-CM | POA: Diagnosis not present

## 2016-11-26 DIAGNOSIS — E1129 Type 2 diabetes mellitus with other diabetic kidney complication: Secondary | ICD-10-CM | POA: Diagnosis not present

## 2016-11-26 DIAGNOSIS — D631 Anemia in chronic kidney disease: Secondary | ICD-10-CM | POA: Diagnosis not present

## 2016-11-28 DIAGNOSIS — N2581 Secondary hyperparathyroidism of renal origin: Secondary | ICD-10-CM | POA: Diagnosis not present

## 2016-11-28 DIAGNOSIS — N186 End stage renal disease: Secondary | ICD-10-CM | POA: Diagnosis not present

## 2016-11-28 DIAGNOSIS — D509 Iron deficiency anemia, unspecified: Secondary | ICD-10-CM | POA: Diagnosis not present

## 2016-11-28 DIAGNOSIS — E1129 Type 2 diabetes mellitus with other diabetic kidney complication: Secondary | ICD-10-CM | POA: Diagnosis not present

## 2016-11-28 DIAGNOSIS — D631 Anemia in chronic kidney disease: Secondary | ICD-10-CM | POA: Diagnosis not present

## 2016-12-01 DIAGNOSIS — N2581 Secondary hyperparathyroidism of renal origin: Secondary | ICD-10-CM | POA: Diagnosis not present

## 2016-12-01 DIAGNOSIS — D631 Anemia in chronic kidney disease: Secondary | ICD-10-CM | POA: Diagnosis not present

## 2016-12-01 DIAGNOSIS — N186 End stage renal disease: Secondary | ICD-10-CM | POA: Diagnosis not present

## 2016-12-01 DIAGNOSIS — D509 Iron deficiency anemia, unspecified: Secondary | ICD-10-CM | POA: Diagnosis not present

## 2016-12-01 DIAGNOSIS — E1129 Type 2 diabetes mellitus with other diabetic kidney complication: Secondary | ICD-10-CM | POA: Diagnosis not present

## 2016-12-03 DIAGNOSIS — N2581 Secondary hyperparathyroidism of renal origin: Secondary | ICD-10-CM | POA: Diagnosis not present

## 2016-12-03 DIAGNOSIS — E1129 Type 2 diabetes mellitus with other diabetic kidney complication: Secondary | ICD-10-CM | POA: Diagnosis not present

## 2016-12-03 DIAGNOSIS — N186 End stage renal disease: Secondary | ICD-10-CM | POA: Diagnosis not present

## 2016-12-03 DIAGNOSIS — D509 Iron deficiency anemia, unspecified: Secondary | ICD-10-CM | POA: Diagnosis not present

## 2016-12-03 DIAGNOSIS — D631 Anemia in chronic kidney disease: Secondary | ICD-10-CM | POA: Diagnosis not present

## 2016-12-05 DIAGNOSIS — N186 End stage renal disease: Secondary | ICD-10-CM | POA: Diagnosis not present

## 2016-12-05 DIAGNOSIS — D631 Anemia in chronic kidney disease: Secondary | ICD-10-CM | POA: Diagnosis not present

## 2016-12-05 DIAGNOSIS — N2581 Secondary hyperparathyroidism of renal origin: Secondary | ICD-10-CM | POA: Diagnosis not present

## 2016-12-05 DIAGNOSIS — E1129 Type 2 diabetes mellitus with other diabetic kidney complication: Secondary | ICD-10-CM | POA: Diagnosis not present

## 2016-12-05 DIAGNOSIS — D509 Iron deficiency anemia, unspecified: Secondary | ICD-10-CM | POA: Diagnosis not present

## 2016-12-08 DIAGNOSIS — D509 Iron deficiency anemia, unspecified: Secondary | ICD-10-CM | POA: Diagnosis not present

## 2016-12-08 DIAGNOSIS — N186 End stage renal disease: Secondary | ICD-10-CM | POA: Diagnosis not present

## 2016-12-08 DIAGNOSIS — N2581 Secondary hyperparathyroidism of renal origin: Secondary | ICD-10-CM | POA: Diagnosis not present

## 2016-12-08 DIAGNOSIS — E1129 Type 2 diabetes mellitus with other diabetic kidney complication: Secondary | ICD-10-CM | POA: Diagnosis not present

## 2016-12-08 DIAGNOSIS — D631 Anemia in chronic kidney disease: Secondary | ICD-10-CM | POA: Diagnosis not present

## 2016-12-10 DIAGNOSIS — N2581 Secondary hyperparathyroidism of renal origin: Secondary | ICD-10-CM | POA: Diagnosis not present

## 2016-12-10 DIAGNOSIS — D509 Iron deficiency anemia, unspecified: Secondary | ICD-10-CM | POA: Diagnosis not present

## 2016-12-10 DIAGNOSIS — N186 End stage renal disease: Secondary | ICD-10-CM | POA: Diagnosis not present

## 2016-12-10 DIAGNOSIS — D631 Anemia in chronic kidney disease: Secondary | ICD-10-CM | POA: Diagnosis not present

## 2016-12-10 DIAGNOSIS — E1129 Type 2 diabetes mellitus with other diabetic kidney complication: Secondary | ICD-10-CM | POA: Diagnosis not present

## 2016-12-12 DIAGNOSIS — D631 Anemia in chronic kidney disease: Secondary | ICD-10-CM | POA: Diagnosis not present

## 2016-12-12 DIAGNOSIS — E1129 Type 2 diabetes mellitus with other diabetic kidney complication: Secondary | ICD-10-CM | POA: Diagnosis not present

## 2016-12-12 DIAGNOSIS — D509 Iron deficiency anemia, unspecified: Secondary | ICD-10-CM | POA: Diagnosis not present

## 2016-12-12 DIAGNOSIS — N2581 Secondary hyperparathyroidism of renal origin: Secondary | ICD-10-CM | POA: Diagnosis not present

## 2016-12-12 DIAGNOSIS — N186 End stage renal disease: Secondary | ICD-10-CM | POA: Diagnosis not present

## 2016-12-15 DIAGNOSIS — N186 End stage renal disease: Secondary | ICD-10-CM | POA: Diagnosis not present

## 2016-12-15 DIAGNOSIS — D509 Iron deficiency anemia, unspecified: Secondary | ICD-10-CM | POA: Diagnosis not present

## 2016-12-15 DIAGNOSIS — D631 Anemia in chronic kidney disease: Secondary | ICD-10-CM | POA: Diagnosis not present

## 2016-12-15 DIAGNOSIS — N2581 Secondary hyperparathyroidism of renal origin: Secondary | ICD-10-CM | POA: Diagnosis not present

## 2016-12-15 DIAGNOSIS — E1129 Type 2 diabetes mellitus with other diabetic kidney complication: Secondary | ICD-10-CM | POA: Diagnosis not present

## 2016-12-17 DIAGNOSIS — D631 Anemia in chronic kidney disease: Secondary | ICD-10-CM | POA: Diagnosis not present

## 2016-12-17 DIAGNOSIS — N2581 Secondary hyperparathyroidism of renal origin: Secondary | ICD-10-CM | POA: Diagnosis not present

## 2016-12-17 DIAGNOSIS — E1129 Type 2 diabetes mellitus with other diabetic kidney complication: Secondary | ICD-10-CM | POA: Diagnosis not present

## 2016-12-17 DIAGNOSIS — N186 End stage renal disease: Secondary | ICD-10-CM | POA: Diagnosis not present

## 2016-12-17 DIAGNOSIS — D509 Iron deficiency anemia, unspecified: Secondary | ICD-10-CM | POA: Diagnosis not present

## 2016-12-17 DIAGNOSIS — Z992 Dependence on renal dialysis: Secondary | ICD-10-CM | POA: Diagnosis not present

## 2016-12-19 DIAGNOSIS — D509 Iron deficiency anemia, unspecified: Secondary | ICD-10-CM | POA: Diagnosis not present

## 2016-12-19 DIAGNOSIS — E1129 Type 2 diabetes mellitus with other diabetic kidney complication: Secondary | ICD-10-CM | POA: Diagnosis not present

## 2016-12-19 DIAGNOSIS — N186 End stage renal disease: Secondary | ICD-10-CM | POA: Diagnosis not present

## 2016-12-19 DIAGNOSIS — N2581 Secondary hyperparathyroidism of renal origin: Secondary | ICD-10-CM | POA: Diagnosis not present

## 2016-12-22 DIAGNOSIS — N186 End stage renal disease: Secondary | ICD-10-CM | POA: Diagnosis not present

## 2016-12-22 DIAGNOSIS — E1129 Type 2 diabetes mellitus with other diabetic kidney complication: Secondary | ICD-10-CM | POA: Diagnosis not present

## 2016-12-22 DIAGNOSIS — D509 Iron deficiency anemia, unspecified: Secondary | ICD-10-CM | POA: Diagnosis not present

## 2016-12-22 DIAGNOSIS — N2581 Secondary hyperparathyroidism of renal origin: Secondary | ICD-10-CM | POA: Diagnosis not present

## 2016-12-24 DIAGNOSIS — N2581 Secondary hyperparathyroidism of renal origin: Secondary | ICD-10-CM | POA: Diagnosis not present

## 2016-12-24 DIAGNOSIS — E1129 Type 2 diabetes mellitus with other diabetic kidney complication: Secondary | ICD-10-CM | POA: Diagnosis not present

## 2016-12-24 DIAGNOSIS — D509 Iron deficiency anemia, unspecified: Secondary | ICD-10-CM | POA: Diagnosis not present

## 2016-12-24 DIAGNOSIS — N186 End stage renal disease: Secondary | ICD-10-CM | POA: Diagnosis not present

## 2016-12-26 DIAGNOSIS — D509 Iron deficiency anemia, unspecified: Secondary | ICD-10-CM | POA: Diagnosis not present

## 2016-12-26 DIAGNOSIS — N2581 Secondary hyperparathyroidism of renal origin: Secondary | ICD-10-CM | POA: Diagnosis not present

## 2016-12-26 DIAGNOSIS — N186 End stage renal disease: Secondary | ICD-10-CM | POA: Diagnosis not present

## 2016-12-26 DIAGNOSIS — E1129 Type 2 diabetes mellitus with other diabetic kidney complication: Secondary | ICD-10-CM | POA: Diagnosis not present

## 2016-12-29 DIAGNOSIS — N186 End stage renal disease: Secondary | ICD-10-CM | POA: Diagnosis not present

## 2016-12-29 DIAGNOSIS — E1129 Type 2 diabetes mellitus with other diabetic kidney complication: Secondary | ICD-10-CM | POA: Diagnosis not present

## 2016-12-29 DIAGNOSIS — D509 Iron deficiency anemia, unspecified: Secondary | ICD-10-CM | POA: Diagnosis not present

## 2016-12-29 DIAGNOSIS — N2581 Secondary hyperparathyroidism of renal origin: Secondary | ICD-10-CM | POA: Diagnosis not present

## 2016-12-31 DIAGNOSIS — D509 Iron deficiency anemia, unspecified: Secondary | ICD-10-CM | POA: Diagnosis not present

## 2016-12-31 DIAGNOSIS — E1129 Type 2 diabetes mellitus with other diabetic kidney complication: Secondary | ICD-10-CM | POA: Diagnosis not present

## 2016-12-31 DIAGNOSIS — N186 End stage renal disease: Secondary | ICD-10-CM | POA: Diagnosis not present

## 2016-12-31 DIAGNOSIS — N2581 Secondary hyperparathyroidism of renal origin: Secondary | ICD-10-CM | POA: Diagnosis not present

## 2017-01-02 DIAGNOSIS — N186 End stage renal disease: Secondary | ICD-10-CM | POA: Diagnosis not present

## 2017-01-02 DIAGNOSIS — E1129 Type 2 diabetes mellitus with other diabetic kidney complication: Secondary | ICD-10-CM | POA: Diagnosis not present

## 2017-01-02 DIAGNOSIS — D509 Iron deficiency anemia, unspecified: Secondary | ICD-10-CM | POA: Diagnosis not present

## 2017-01-02 DIAGNOSIS — N2581 Secondary hyperparathyroidism of renal origin: Secondary | ICD-10-CM | POA: Diagnosis not present

## 2017-01-05 DIAGNOSIS — N186 End stage renal disease: Secondary | ICD-10-CM | POA: Diagnosis not present

## 2017-01-05 DIAGNOSIS — N2581 Secondary hyperparathyroidism of renal origin: Secondary | ICD-10-CM | POA: Diagnosis not present

## 2017-01-05 DIAGNOSIS — D509 Iron deficiency anemia, unspecified: Secondary | ICD-10-CM | POA: Diagnosis not present

## 2017-01-05 DIAGNOSIS — E1129 Type 2 diabetes mellitus with other diabetic kidney complication: Secondary | ICD-10-CM | POA: Diagnosis not present

## 2017-01-07 DIAGNOSIS — E1129 Type 2 diabetes mellitus with other diabetic kidney complication: Secondary | ICD-10-CM | POA: Diagnosis not present

## 2017-01-07 DIAGNOSIS — N186 End stage renal disease: Secondary | ICD-10-CM | POA: Diagnosis not present

## 2017-01-07 DIAGNOSIS — D509 Iron deficiency anemia, unspecified: Secondary | ICD-10-CM | POA: Diagnosis not present

## 2017-01-07 DIAGNOSIS — N2581 Secondary hyperparathyroidism of renal origin: Secondary | ICD-10-CM | POA: Diagnosis not present

## 2017-01-09 DIAGNOSIS — D509 Iron deficiency anemia, unspecified: Secondary | ICD-10-CM | POA: Diagnosis not present

## 2017-01-09 DIAGNOSIS — N2581 Secondary hyperparathyroidism of renal origin: Secondary | ICD-10-CM | POA: Diagnosis not present

## 2017-01-09 DIAGNOSIS — N186 End stage renal disease: Secondary | ICD-10-CM | POA: Diagnosis not present

## 2017-01-09 DIAGNOSIS — E1129 Type 2 diabetes mellitus with other diabetic kidney complication: Secondary | ICD-10-CM | POA: Diagnosis not present

## 2017-01-11 DIAGNOSIS — E1122 Type 2 diabetes mellitus with diabetic chronic kidney disease: Secondary | ICD-10-CM | POA: Diagnosis not present

## 2017-01-11 DIAGNOSIS — N186 End stage renal disease: Secondary | ICD-10-CM | POA: Diagnosis not present

## 2017-01-11 DIAGNOSIS — I10 Essential (primary) hypertension: Secondary | ICD-10-CM | POA: Diagnosis not present

## 2017-01-11 DIAGNOSIS — Z89512 Acquired absence of left leg below knee: Secondary | ICD-10-CM | POA: Diagnosis not present

## 2017-01-12 DIAGNOSIS — N2581 Secondary hyperparathyroidism of renal origin: Secondary | ICD-10-CM | POA: Diagnosis not present

## 2017-01-12 DIAGNOSIS — N186 End stage renal disease: Secondary | ICD-10-CM | POA: Diagnosis not present

## 2017-01-12 DIAGNOSIS — E1129 Type 2 diabetes mellitus with other diabetic kidney complication: Secondary | ICD-10-CM | POA: Diagnosis not present

## 2017-01-12 DIAGNOSIS — D509 Iron deficiency anemia, unspecified: Secondary | ICD-10-CM | POA: Diagnosis not present

## 2017-01-14 DIAGNOSIS — N2581 Secondary hyperparathyroidism of renal origin: Secondary | ICD-10-CM | POA: Diagnosis not present

## 2017-01-14 DIAGNOSIS — D509 Iron deficiency anemia, unspecified: Secondary | ICD-10-CM | POA: Diagnosis not present

## 2017-01-14 DIAGNOSIS — E1129 Type 2 diabetes mellitus with other diabetic kidney complication: Secondary | ICD-10-CM | POA: Diagnosis not present

## 2017-01-14 DIAGNOSIS — N186 End stage renal disease: Secondary | ICD-10-CM | POA: Diagnosis not present

## 2017-01-16 DIAGNOSIS — Z992 Dependence on renal dialysis: Secondary | ICD-10-CM | POA: Diagnosis not present

## 2017-01-16 DIAGNOSIS — E1129 Type 2 diabetes mellitus with other diabetic kidney complication: Secondary | ICD-10-CM | POA: Diagnosis not present

## 2017-01-16 DIAGNOSIS — N2581 Secondary hyperparathyroidism of renal origin: Secondary | ICD-10-CM | POA: Diagnosis not present

## 2017-01-16 DIAGNOSIS — D509 Iron deficiency anemia, unspecified: Secondary | ICD-10-CM | POA: Diagnosis not present

## 2017-01-16 DIAGNOSIS — N186 End stage renal disease: Secondary | ICD-10-CM | POA: Diagnosis not present

## 2017-01-19 DIAGNOSIS — E1129 Type 2 diabetes mellitus with other diabetic kidney complication: Secondary | ICD-10-CM | POA: Diagnosis not present

## 2017-01-19 DIAGNOSIS — N186 End stage renal disease: Secondary | ICD-10-CM | POA: Diagnosis not present

## 2017-01-19 DIAGNOSIS — N2581 Secondary hyperparathyroidism of renal origin: Secondary | ICD-10-CM | POA: Diagnosis not present

## 2017-01-19 DIAGNOSIS — D509 Iron deficiency anemia, unspecified: Secondary | ICD-10-CM | POA: Diagnosis not present

## 2017-01-19 DIAGNOSIS — D631 Anemia in chronic kidney disease: Secondary | ICD-10-CM | POA: Diagnosis not present

## 2017-01-21 DIAGNOSIS — N2581 Secondary hyperparathyroidism of renal origin: Secondary | ICD-10-CM | POA: Diagnosis not present

## 2017-01-21 DIAGNOSIS — D509 Iron deficiency anemia, unspecified: Secondary | ICD-10-CM | POA: Diagnosis not present

## 2017-01-21 DIAGNOSIS — D631 Anemia in chronic kidney disease: Secondary | ICD-10-CM | POA: Diagnosis not present

## 2017-01-21 DIAGNOSIS — N186 End stage renal disease: Secondary | ICD-10-CM | POA: Diagnosis not present

## 2017-01-21 DIAGNOSIS — E1129 Type 2 diabetes mellitus with other diabetic kidney complication: Secondary | ICD-10-CM | POA: Diagnosis not present

## 2017-01-23 DIAGNOSIS — N2581 Secondary hyperparathyroidism of renal origin: Secondary | ICD-10-CM | POA: Diagnosis not present

## 2017-01-23 DIAGNOSIS — D631 Anemia in chronic kidney disease: Secondary | ICD-10-CM | POA: Diagnosis not present

## 2017-01-23 DIAGNOSIS — N186 End stage renal disease: Secondary | ICD-10-CM | POA: Diagnosis not present

## 2017-01-23 DIAGNOSIS — E1129 Type 2 diabetes mellitus with other diabetic kidney complication: Secondary | ICD-10-CM | POA: Diagnosis not present

## 2017-01-23 DIAGNOSIS — D509 Iron deficiency anemia, unspecified: Secondary | ICD-10-CM | POA: Diagnosis not present

## 2017-01-26 DIAGNOSIS — D509 Iron deficiency anemia, unspecified: Secondary | ICD-10-CM | POA: Diagnosis not present

## 2017-01-26 DIAGNOSIS — E1129 Type 2 diabetes mellitus with other diabetic kidney complication: Secondary | ICD-10-CM | POA: Diagnosis not present

## 2017-01-26 DIAGNOSIS — N2581 Secondary hyperparathyroidism of renal origin: Secondary | ICD-10-CM | POA: Diagnosis not present

## 2017-01-26 DIAGNOSIS — D631 Anemia in chronic kidney disease: Secondary | ICD-10-CM | POA: Diagnosis not present

## 2017-01-26 DIAGNOSIS — N186 End stage renal disease: Secondary | ICD-10-CM | POA: Diagnosis not present

## 2017-01-28 DIAGNOSIS — E1129 Type 2 diabetes mellitus with other diabetic kidney complication: Secondary | ICD-10-CM | POA: Diagnosis not present

## 2017-01-28 DIAGNOSIS — D509 Iron deficiency anemia, unspecified: Secondary | ICD-10-CM | POA: Diagnosis not present

## 2017-01-28 DIAGNOSIS — N186 End stage renal disease: Secondary | ICD-10-CM | POA: Diagnosis not present

## 2017-01-28 DIAGNOSIS — N2581 Secondary hyperparathyroidism of renal origin: Secondary | ICD-10-CM | POA: Diagnosis not present

## 2017-01-28 DIAGNOSIS — D631 Anemia in chronic kidney disease: Secondary | ICD-10-CM | POA: Diagnosis not present

## 2017-01-30 DIAGNOSIS — N2581 Secondary hyperparathyroidism of renal origin: Secondary | ICD-10-CM | POA: Diagnosis not present

## 2017-01-30 DIAGNOSIS — E1129 Type 2 diabetes mellitus with other diabetic kidney complication: Secondary | ICD-10-CM | POA: Diagnosis not present

## 2017-01-30 DIAGNOSIS — D631 Anemia in chronic kidney disease: Secondary | ICD-10-CM | POA: Diagnosis not present

## 2017-01-30 DIAGNOSIS — D509 Iron deficiency anemia, unspecified: Secondary | ICD-10-CM | POA: Diagnosis not present

## 2017-01-30 DIAGNOSIS — N186 End stage renal disease: Secondary | ICD-10-CM | POA: Diagnosis not present

## 2017-02-04 DIAGNOSIS — N2581 Secondary hyperparathyroidism of renal origin: Secondary | ICD-10-CM | POA: Diagnosis not present

## 2017-02-04 DIAGNOSIS — D509 Iron deficiency anemia, unspecified: Secondary | ICD-10-CM | POA: Diagnosis not present

## 2017-02-04 DIAGNOSIS — N186 End stage renal disease: Secondary | ICD-10-CM | POA: Diagnosis not present

## 2017-02-04 DIAGNOSIS — D631 Anemia in chronic kidney disease: Secondary | ICD-10-CM | POA: Diagnosis not present

## 2017-02-04 DIAGNOSIS — E1129 Type 2 diabetes mellitus with other diabetic kidney complication: Secondary | ICD-10-CM | POA: Diagnosis not present

## 2017-02-06 DIAGNOSIS — N2581 Secondary hyperparathyroidism of renal origin: Secondary | ICD-10-CM | POA: Diagnosis not present

## 2017-02-06 DIAGNOSIS — D631 Anemia in chronic kidney disease: Secondary | ICD-10-CM | POA: Diagnosis not present

## 2017-02-06 DIAGNOSIS — E1129 Type 2 diabetes mellitus with other diabetic kidney complication: Secondary | ICD-10-CM | POA: Diagnosis not present

## 2017-02-06 DIAGNOSIS — D509 Iron deficiency anemia, unspecified: Secondary | ICD-10-CM | POA: Diagnosis not present

## 2017-02-06 DIAGNOSIS — N186 End stage renal disease: Secondary | ICD-10-CM | POA: Diagnosis not present

## 2017-02-09 DIAGNOSIS — D631 Anemia in chronic kidney disease: Secondary | ICD-10-CM | POA: Diagnosis not present

## 2017-02-09 DIAGNOSIS — E1129 Type 2 diabetes mellitus with other diabetic kidney complication: Secondary | ICD-10-CM | POA: Diagnosis not present

## 2017-02-09 DIAGNOSIS — D509 Iron deficiency anemia, unspecified: Secondary | ICD-10-CM | POA: Diagnosis not present

## 2017-02-09 DIAGNOSIS — N2581 Secondary hyperparathyroidism of renal origin: Secondary | ICD-10-CM | POA: Diagnosis not present

## 2017-02-09 DIAGNOSIS — N186 End stage renal disease: Secondary | ICD-10-CM | POA: Diagnosis not present

## 2017-02-11 DIAGNOSIS — N186 End stage renal disease: Secondary | ICD-10-CM | POA: Diagnosis not present

## 2017-02-11 DIAGNOSIS — N2581 Secondary hyperparathyroidism of renal origin: Secondary | ICD-10-CM | POA: Diagnosis not present

## 2017-02-11 DIAGNOSIS — E1129 Type 2 diabetes mellitus with other diabetic kidney complication: Secondary | ICD-10-CM | POA: Diagnosis not present

## 2017-02-11 DIAGNOSIS — D631 Anemia in chronic kidney disease: Secondary | ICD-10-CM | POA: Diagnosis not present

## 2017-02-11 DIAGNOSIS — D509 Iron deficiency anemia, unspecified: Secondary | ICD-10-CM | POA: Diagnosis not present

## 2017-02-13 DIAGNOSIS — E1129 Type 2 diabetes mellitus with other diabetic kidney complication: Secondary | ICD-10-CM | POA: Diagnosis not present

## 2017-02-13 DIAGNOSIS — N186 End stage renal disease: Secondary | ICD-10-CM | POA: Diagnosis not present

## 2017-02-13 DIAGNOSIS — D631 Anemia in chronic kidney disease: Secondary | ICD-10-CM | POA: Diagnosis not present

## 2017-02-13 DIAGNOSIS — N2581 Secondary hyperparathyroidism of renal origin: Secondary | ICD-10-CM | POA: Diagnosis not present

## 2017-02-13 DIAGNOSIS — D509 Iron deficiency anemia, unspecified: Secondary | ICD-10-CM | POA: Diagnosis not present

## 2017-02-15 ENCOUNTER — Other Ambulatory Visit: Payer: Self-pay | Admitting: Surgery

## 2017-02-15 ENCOUNTER — Ambulatory Visit (INDEPENDENT_AMBULATORY_CARE_PROVIDER_SITE_OTHER): Payer: Medicare Other | Admitting: Podiatry

## 2017-02-15 ENCOUNTER — Encounter: Payer: Self-pay | Admitting: Podiatry

## 2017-02-15 DIAGNOSIS — B351 Tinea unguium: Secondary | ICD-10-CM | POA: Diagnosis not present

## 2017-02-15 DIAGNOSIS — M79674 Pain in right toe(s): Secondary | ICD-10-CM

## 2017-02-15 DIAGNOSIS — M79675 Pain in left toe(s): Secondary | ICD-10-CM | POA: Diagnosis not present

## 2017-02-15 DIAGNOSIS — E1151 Type 2 diabetes mellitus with diabetic peripheral angiopathy without gangrene: Secondary | ICD-10-CM

## 2017-02-15 NOTE — Patient Instructions (Signed)

## 2017-02-15 NOTE — Progress Notes (Signed)
   Subjective:    Patient ID: Jackie [name], female    DOB: 10/24/1934, 82 y.o.   MRN: [id number]  HPI  [name] Presents the office today for concerns of ingrown toenails as well as thick, elongated toenails on the right that she cannot trim herself. She states that when she is in dialysis they recommenced her to come in for evaluation. She doesn't history wounds to bilateral legs and she was in hyperbaric oxygen therapy wound care center. The wound on the right side heel but she ended with a BKA on the left side. She denies any swelling or redness or any drainage and the toenail sites and she denies any open sores today to the right foot.  Review of Systems  Gastrointestinal: Positive for nausea and vomiting.  All other systems reviewed and are negative.      Objective:   Physical Exam General: AAO x3, NAD  LEFT BKA  Dermatological: Nails are hypertrophic, dystrophic, brittle, discolored, elongated 10. No surrounding redness or drainage. Tenderness nails 1-5 on he right. On the dorsal aspect of the right further scar from prior wounds to there's no open sores identified today. No open lesions or pre-ulcerative lesions are identified today.  Vascular: Dorsalis Pedis artery and Posterior Tibial artery pedal pulses are decreased. There is no pain with calf compression, swelling, warmth, erythema.   Neruologic: Sensation decreased with Dorann OuSimms Weinstein monofilament.  Musculoskeletal: No pain, crepitus, or limitation noted with foot and ankle range of motion bilateral. Muscular strength 5/5 in all groups tested bilateral.    Assessment & Plan:  82 year old female with symptomatic onychomycosis, PVD, neuropathy -Treatment options discussed including all alternatives, risks, and complications -Etiology of symptoms were discussed -Nails debrided 5 without complications or bleeding. -Daily foot inspection -Follow-up in 3 months or sooner if any problems arise. In the meantime,  encouraged to call the office with any questions, concerns, change in symptoms.   Ovid CurdMatthew Cierra Rothgeb, DPM

## 2017-02-16 DIAGNOSIS — D509 Iron deficiency anemia, unspecified: Secondary | ICD-10-CM | POA: Diagnosis not present

## 2017-02-16 DIAGNOSIS — Z992 Dependence on renal dialysis: Secondary | ICD-10-CM | POA: Diagnosis not present

## 2017-02-16 DIAGNOSIS — N186 End stage renal disease: Secondary | ICD-10-CM | POA: Diagnosis not present

## 2017-02-16 DIAGNOSIS — N2581 Secondary hyperparathyroidism of renal origin: Secondary | ICD-10-CM | POA: Diagnosis not present

## 2017-02-16 DIAGNOSIS — E1129 Type 2 diabetes mellitus with other diabetic kidney complication: Secondary | ICD-10-CM | POA: Diagnosis not present

## 2017-02-16 DIAGNOSIS — D631 Anemia in chronic kidney disease: Secondary | ICD-10-CM | POA: Diagnosis not present

## 2017-02-18 DIAGNOSIS — N186 End stage renal disease: Secondary | ICD-10-CM | POA: Diagnosis not present

## 2017-02-18 DIAGNOSIS — E1129 Type 2 diabetes mellitus with other diabetic kidney complication: Secondary | ICD-10-CM | POA: Diagnosis not present

## 2017-02-18 DIAGNOSIS — D631 Anemia in chronic kidney disease: Secondary | ICD-10-CM | POA: Diagnosis not present

## 2017-02-18 DIAGNOSIS — D509 Iron deficiency anemia, unspecified: Secondary | ICD-10-CM | POA: Diagnosis not present

## 2017-02-18 DIAGNOSIS — N2581 Secondary hyperparathyroidism of renal origin: Secondary | ICD-10-CM | POA: Diagnosis not present

## 2017-02-20 DIAGNOSIS — E1129 Type 2 diabetes mellitus with other diabetic kidney complication: Secondary | ICD-10-CM | POA: Diagnosis not present

## 2017-02-20 DIAGNOSIS — N2581 Secondary hyperparathyroidism of renal origin: Secondary | ICD-10-CM | POA: Diagnosis not present

## 2017-02-20 DIAGNOSIS — D631 Anemia in chronic kidney disease: Secondary | ICD-10-CM | POA: Diagnosis not present

## 2017-02-20 DIAGNOSIS — N186 End stage renal disease: Secondary | ICD-10-CM | POA: Diagnosis not present

## 2017-02-20 DIAGNOSIS — D509 Iron deficiency anemia, unspecified: Secondary | ICD-10-CM | POA: Diagnosis not present

## 2017-02-23 DIAGNOSIS — D509 Iron deficiency anemia, unspecified: Secondary | ICD-10-CM | POA: Diagnosis not present

## 2017-02-23 DIAGNOSIS — E1129 Type 2 diabetes mellitus with other diabetic kidney complication: Secondary | ICD-10-CM | POA: Diagnosis not present

## 2017-02-23 DIAGNOSIS — N2581 Secondary hyperparathyroidism of renal origin: Secondary | ICD-10-CM | POA: Diagnosis not present

## 2017-02-23 DIAGNOSIS — D631 Anemia in chronic kidney disease: Secondary | ICD-10-CM | POA: Diagnosis not present

## 2017-02-23 DIAGNOSIS — N186 End stage renal disease: Secondary | ICD-10-CM | POA: Diagnosis not present

## 2017-02-25 DIAGNOSIS — N186 End stage renal disease: Secondary | ICD-10-CM | POA: Diagnosis not present

## 2017-02-25 DIAGNOSIS — E1129 Type 2 diabetes mellitus with other diabetic kidney complication: Secondary | ICD-10-CM | POA: Diagnosis not present

## 2017-02-25 DIAGNOSIS — N2581 Secondary hyperparathyroidism of renal origin: Secondary | ICD-10-CM | POA: Diagnosis not present

## 2017-02-25 DIAGNOSIS — D509 Iron deficiency anemia, unspecified: Secondary | ICD-10-CM | POA: Diagnosis not present

## 2017-02-25 DIAGNOSIS — D631 Anemia in chronic kidney disease: Secondary | ICD-10-CM | POA: Diagnosis not present

## 2017-02-27 DIAGNOSIS — E1129 Type 2 diabetes mellitus with other diabetic kidney complication: Secondary | ICD-10-CM | POA: Diagnosis not present

## 2017-02-27 DIAGNOSIS — N186 End stage renal disease: Secondary | ICD-10-CM | POA: Diagnosis not present

## 2017-02-27 DIAGNOSIS — D509 Iron deficiency anemia, unspecified: Secondary | ICD-10-CM | POA: Diagnosis not present

## 2017-02-27 DIAGNOSIS — N2581 Secondary hyperparathyroidism of renal origin: Secondary | ICD-10-CM | POA: Diagnosis not present

## 2017-02-27 DIAGNOSIS — D631 Anemia in chronic kidney disease: Secondary | ICD-10-CM | POA: Diagnosis not present

## 2017-03-02 DIAGNOSIS — E1129 Type 2 diabetes mellitus with other diabetic kidney complication: Secondary | ICD-10-CM | POA: Diagnosis not present

## 2017-03-02 DIAGNOSIS — N2581 Secondary hyperparathyroidism of renal origin: Secondary | ICD-10-CM | POA: Diagnosis not present

## 2017-03-02 DIAGNOSIS — N186 End stage renal disease: Secondary | ICD-10-CM | POA: Diagnosis not present

## 2017-03-02 DIAGNOSIS — D631 Anemia in chronic kidney disease: Secondary | ICD-10-CM | POA: Diagnosis not present

## 2017-03-02 DIAGNOSIS — D509 Iron deficiency anemia, unspecified: Secondary | ICD-10-CM | POA: Diagnosis not present

## 2017-03-04 DIAGNOSIS — E1129 Type 2 diabetes mellitus with other diabetic kidney complication: Secondary | ICD-10-CM | POA: Diagnosis not present

## 2017-03-04 DIAGNOSIS — N186 End stage renal disease: Secondary | ICD-10-CM | POA: Diagnosis not present

## 2017-03-04 DIAGNOSIS — D631 Anemia in chronic kidney disease: Secondary | ICD-10-CM | POA: Diagnosis not present

## 2017-03-04 DIAGNOSIS — D509 Iron deficiency anemia, unspecified: Secondary | ICD-10-CM | POA: Diagnosis not present

## 2017-03-04 DIAGNOSIS — N2581 Secondary hyperparathyroidism of renal origin: Secondary | ICD-10-CM | POA: Diagnosis not present

## 2017-03-06 DIAGNOSIS — D631 Anemia in chronic kidney disease: Secondary | ICD-10-CM | POA: Diagnosis not present

## 2017-03-06 DIAGNOSIS — E1129 Type 2 diabetes mellitus with other diabetic kidney complication: Secondary | ICD-10-CM | POA: Diagnosis not present

## 2017-03-06 DIAGNOSIS — N2581 Secondary hyperparathyroidism of renal origin: Secondary | ICD-10-CM | POA: Diagnosis not present

## 2017-03-06 DIAGNOSIS — D509 Iron deficiency anemia, unspecified: Secondary | ICD-10-CM | POA: Diagnosis not present

## 2017-03-06 DIAGNOSIS — N186 End stage renal disease: Secondary | ICD-10-CM | POA: Diagnosis not present

## 2017-03-09 DIAGNOSIS — N2581 Secondary hyperparathyroidism of renal origin: Secondary | ICD-10-CM | POA: Diagnosis not present

## 2017-03-09 DIAGNOSIS — D631 Anemia in chronic kidney disease: Secondary | ICD-10-CM | POA: Diagnosis not present

## 2017-03-09 DIAGNOSIS — E1129 Type 2 diabetes mellitus with other diabetic kidney complication: Secondary | ICD-10-CM | POA: Diagnosis not present

## 2017-03-09 DIAGNOSIS — N186 End stage renal disease: Secondary | ICD-10-CM | POA: Diagnosis not present

## 2017-03-09 DIAGNOSIS — D509 Iron deficiency anemia, unspecified: Secondary | ICD-10-CM | POA: Diagnosis not present

## 2017-03-10 DIAGNOSIS — T82858A Stenosis of vascular prosthetic devices, implants and grafts, initial encounter: Secondary | ICD-10-CM | POA: Diagnosis not present

## 2017-03-10 DIAGNOSIS — N186 End stage renal disease: Secondary | ICD-10-CM | POA: Diagnosis not present

## 2017-03-10 DIAGNOSIS — I871 Compression of vein: Secondary | ICD-10-CM | POA: Diagnosis not present

## 2017-03-10 DIAGNOSIS — Z992 Dependence on renal dialysis: Secondary | ICD-10-CM | POA: Diagnosis not present

## 2017-03-11 DIAGNOSIS — N186 End stage renal disease: Secondary | ICD-10-CM | POA: Diagnosis not present

## 2017-03-11 DIAGNOSIS — N2581 Secondary hyperparathyroidism of renal origin: Secondary | ICD-10-CM | POA: Diagnosis not present

## 2017-03-11 DIAGNOSIS — E1129 Type 2 diabetes mellitus with other diabetic kidney complication: Secondary | ICD-10-CM | POA: Diagnosis not present

## 2017-03-11 DIAGNOSIS — D631 Anemia in chronic kidney disease: Secondary | ICD-10-CM | POA: Diagnosis not present

## 2017-03-11 DIAGNOSIS — D509 Iron deficiency anemia, unspecified: Secondary | ICD-10-CM | POA: Diagnosis not present

## 2017-03-13 DIAGNOSIS — D631 Anemia in chronic kidney disease: Secondary | ICD-10-CM | POA: Diagnosis not present

## 2017-03-13 DIAGNOSIS — N186 End stage renal disease: Secondary | ICD-10-CM | POA: Diagnosis not present

## 2017-03-13 DIAGNOSIS — N2581 Secondary hyperparathyroidism of renal origin: Secondary | ICD-10-CM | POA: Diagnosis not present

## 2017-03-13 DIAGNOSIS — E1129 Type 2 diabetes mellitus with other diabetic kidney complication: Secondary | ICD-10-CM | POA: Diagnosis not present

## 2017-03-13 DIAGNOSIS — D509 Iron deficiency anemia, unspecified: Secondary | ICD-10-CM | POA: Diagnosis not present

## 2017-03-16 DIAGNOSIS — D509 Iron deficiency anemia, unspecified: Secondary | ICD-10-CM | POA: Diagnosis not present

## 2017-03-16 DIAGNOSIS — N2581 Secondary hyperparathyroidism of renal origin: Secondary | ICD-10-CM | POA: Diagnosis not present

## 2017-03-16 DIAGNOSIS — D631 Anemia in chronic kidney disease: Secondary | ICD-10-CM | POA: Diagnosis not present

## 2017-03-16 DIAGNOSIS — E1129 Type 2 diabetes mellitus with other diabetic kidney complication: Secondary | ICD-10-CM | POA: Diagnosis not present

## 2017-03-16 DIAGNOSIS — N186 End stage renal disease: Secondary | ICD-10-CM | POA: Diagnosis not present

## 2017-03-18 DIAGNOSIS — N186 End stage renal disease: Secondary | ICD-10-CM | POA: Diagnosis not present

## 2017-03-18 DIAGNOSIS — D631 Anemia in chronic kidney disease: Secondary | ICD-10-CM | POA: Diagnosis not present

## 2017-03-18 DIAGNOSIS — E1129 Type 2 diabetes mellitus with other diabetic kidney complication: Secondary | ICD-10-CM | POA: Diagnosis not present

## 2017-03-18 DIAGNOSIS — N2581 Secondary hyperparathyroidism of renal origin: Secondary | ICD-10-CM | POA: Diagnosis not present

## 2017-03-18 DIAGNOSIS — D509 Iron deficiency anemia, unspecified: Secondary | ICD-10-CM | POA: Diagnosis not present

## 2017-03-19 DIAGNOSIS — N186 End stage renal disease: Secondary | ICD-10-CM | POA: Diagnosis not present

## 2017-03-19 DIAGNOSIS — E1129 Type 2 diabetes mellitus with other diabetic kidney complication: Secondary | ICD-10-CM | POA: Diagnosis not present

## 2017-03-19 DIAGNOSIS — Z992 Dependence on renal dialysis: Secondary | ICD-10-CM | POA: Diagnosis not present

## 2017-03-20 DIAGNOSIS — N186 End stage renal disease: Secondary | ICD-10-CM | POA: Diagnosis not present

## 2017-03-20 DIAGNOSIS — D631 Anemia in chronic kidney disease: Secondary | ICD-10-CM | POA: Diagnosis not present

## 2017-03-20 DIAGNOSIS — Z23 Encounter for immunization: Secondary | ICD-10-CM | POA: Diagnosis not present

## 2017-03-20 DIAGNOSIS — N2581 Secondary hyperparathyroidism of renal origin: Secondary | ICD-10-CM | POA: Diagnosis not present

## 2017-03-20 DIAGNOSIS — E1129 Type 2 diabetes mellitus with other diabetic kidney complication: Secondary | ICD-10-CM | POA: Diagnosis not present

## 2017-03-20 DIAGNOSIS — D509 Iron deficiency anemia, unspecified: Secondary | ICD-10-CM | POA: Diagnosis not present

## 2017-03-23 DIAGNOSIS — D509 Iron deficiency anemia, unspecified: Secondary | ICD-10-CM | POA: Diagnosis not present

## 2017-03-23 DIAGNOSIS — E1129 Type 2 diabetes mellitus with other diabetic kidney complication: Secondary | ICD-10-CM | POA: Diagnosis not present

## 2017-03-23 DIAGNOSIS — N2581 Secondary hyperparathyroidism of renal origin: Secondary | ICD-10-CM | POA: Diagnosis not present

## 2017-03-23 DIAGNOSIS — N186 End stage renal disease: Secondary | ICD-10-CM | POA: Diagnosis not present

## 2017-03-23 DIAGNOSIS — Z23 Encounter for immunization: Secondary | ICD-10-CM | POA: Diagnosis not present

## 2017-03-23 DIAGNOSIS — D631 Anemia in chronic kidney disease: Secondary | ICD-10-CM | POA: Diagnosis not present

## 2017-03-25 DIAGNOSIS — E1129 Type 2 diabetes mellitus with other diabetic kidney complication: Secondary | ICD-10-CM | POA: Diagnosis not present

## 2017-03-25 DIAGNOSIS — N2581 Secondary hyperparathyroidism of renal origin: Secondary | ICD-10-CM | POA: Diagnosis not present

## 2017-03-25 DIAGNOSIS — N186 End stage renal disease: Secondary | ICD-10-CM | POA: Diagnosis not present

## 2017-03-25 DIAGNOSIS — D509 Iron deficiency anemia, unspecified: Secondary | ICD-10-CM | POA: Diagnosis not present

## 2017-03-25 DIAGNOSIS — Z23 Encounter for immunization: Secondary | ICD-10-CM | POA: Diagnosis not present

## 2017-03-25 DIAGNOSIS — D631 Anemia in chronic kidney disease: Secondary | ICD-10-CM | POA: Diagnosis not present

## 2017-03-27 DIAGNOSIS — D631 Anemia in chronic kidney disease: Secondary | ICD-10-CM | POA: Diagnosis not present

## 2017-03-27 DIAGNOSIS — N2581 Secondary hyperparathyroidism of renal origin: Secondary | ICD-10-CM | POA: Diagnosis not present

## 2017-03-27 DIAGNOSIS — D509 Iron deficiency anemia, unspecified: Secondary | ICD-10-CM | POA: Diagnosis not present

## 2017-03-27 DIAGNOSIS — Z23 Encounter for immunization: Secondary | ICD-10-CM | POA: Diagnosis not present

## 2017-03-27 DIAGNOSIS — E1129 Type 2 diabetes mellitus with other diabetic kidney complication: Secondary | ICD-10-CM | POA: Diagnosis not present

## 2017-03-27 DIAGNOSIS — N186 End stage renal disease: Secondary | ICD-10-CM | POA: Diagnosis not present

## 2017-03-30 DIAGNOSIS — E1129 Type 2 diabetes mellitus with other diabetic kidney complication: Secondary | ICD-10-CM | POA: Diagnosis not present

## 2017-03-30 DIAGNOSIS — D509 Iron deficiency anemia, unspecified: Secondary | ICD-10-CM | POA: Diagnosis not present

## 2017-03-30 DIAGNOSIS — N2581 Secondary hyperparathyroidism of renal origin: Secondary | ICD-10-CM | POA: Diagnosis not present

## 2017-03-30 DIAGNOSIS — Z23 Encounter for immunization: Secondary | ICD-10-CM | POA: Diagnosis not present

## 2017-03-30 DIAGNOSIS — N186 End stage renal disease: Secondary | ICD-10-CM | POA: Diagnosis not present

## 2017-03-30 DIAGNOSIS — D631 Anemia in chronic kidney disease: Secondary | ICD-10-CM | POA: Diagnosis not present

## 2017-04-01 DIAGNOSIS — E1129 Type 2 diabetes mellitus with other diabetic kidney complication: Secondary | ICD-10-CM | POA: Diagnosis not present

## 2017-04-01 DIAGNOSIS — N186 End stage renal disease: Secondary | ICD-10-CM | POA: Diagnosis not present

## 2017-04-01 DIAGNOSIS — D631 Anemia in chronic kidney disease: Secondary | ICD-10-CM | POA: Diagnosis not present

## 2017-04-01 DIAGNOSIS — Z23 Encounter for immunization: Secondary | ICD-10-CM | POA: Diagnosis not present

## 2017-04-01 DIAGNOSIS — N2581 Secondary hyperparathyroidism of renal origin: Secondary | ICD-10-CM | POA: Diagnosis not present

## 2017-04-01 DIAGNOSIS — D509 Iron deficiency anemia, unspecified: Secondary | ICD-10-CM | POA: Diagnosis not present

## 2017-04-03 DIAGNOSIS — Z23 Encounter for immunization: Secondary | ICD-10-CM | POA: Diagnosis not present

## 2017-04-03 DIAGNOSIS — N186 End stage renal disease: Secondary | ICD-10-CM | POA: Diagnosis not present

## 2017-04-03 DIAGNOSIS — D509 Iron deficiency anemia, unspecified: Secondary | ICD-10-CM | POA: Diagnosis not present

## 2017-04-03 DIAGNOSIS — E1129 Type 2 diabetes mellitus with other diabetic kidney complication: Secondary | ICD-10-CM | POA: Diagnosis not present

## 2017-04-03 DIAGNOSIS — D631 Anemia in chronic kidney disease: Secondary | ICD-10-CM | POA: Diagnosis not present

## 2017-04-03 DIAGNOSIS — N2581 Secondary hyperparathyroidism of renal origin: Secondary | ICD-10-CM | POA: Diagnosis not present

## 2017-04-06 DIAGNOSIS — Z23 Encounter for immunization: Secondary | ICD-10-CM | POA: Diagnosis not present

## 2017-04-06 DIAGNOSIS — D509 Iron deficiency anemia, unspecified: Secondary | ICD-10-CM | POA: Diagnosis not present

## 2017-04-06 DIAGNOSIS — E1129 Type 2 diabetes mellitus with other diabetic kidney complication: Secondary | ICD-10-CM | POA: Diagnosis not present

## 2017-04-06 DIAGNOSIS — N2581 Secondary hyperparathyroidism of renal origin: Secondary | ICD-10-CM | POA: Diagnosis not present

## 2017-04-06 DIAGNOSIS — D631 Anemia in chronic kidney disease: Secondary | ICD-10-CM | POA: Diagnosis not present

## 2017-04-06 DIAGNOSIS — N186 End stage renal disease: Secondary | ICD-10-CM | POA: Diagnosis not present

## 2017-04-08 DIAGNOSIS — N186 End stage renal disease: Secondary | ICD-10-CM | POA: Diagnosis not present

## 2017-04-08 DIAGNOSIS — N2581 Secondary hyperparathyroidism of renal origin: Secondary | ICD-10-CM | POA: Diagnosis not present

## 2017-04-08 DIAGNOSIS — E1129 Type 2 diabetes mellitus with other diabetic kidney complication: Secondary | ICD-10-CM | POA: Diagnosis not present

## 2017-04-08 DIAGNOSIS — D631 Anemia in chronic kidney disease: Secondary | ICD-10-CM | POA: Diagnosis not present

## 2017-04-08 DIAGNOSIS — D509 Iron deficiency anemia, unspecified: Secondary | ICD-10-CM | POA: Diagnosis not present

## 2017-04-08 DIAGNOSIS — Z23 Encounter for immunization: Secondary | ICD-10-CM | POA: Diagnosis not present

## 2017-04-10 DIAGNOSIS — D631 Anemia in chronic kidney disease: Secondary | ICD-10-CM | POA: Diagnosis not present

## 2017-04-10 DIAGNOSIS — N2581 Secondary hyperparathyroidism of renal origin: Secondary | ICD-10-CM | POA: Diagnosis not present

## 2017-04-10 DIAGNOSIS — D509 Iron deficiency anemia, unspecified: Secondary | ICD-10-CM | POA: Diagnosis not present

## 2017-04-10 DIAGNOSIS — Z23 Encounter for immunization: Secondary | ICD-10-CM | POA: Diagnosis not present

## 2017-04-10 DIAGNOSIS — N186 End stage renal disease: Secondary | ICD-10-CM | POA: Diagnosis not present

## 2017-04-10 DIAGNOSIS — E1129 Type 2 diabetes mellitus with other diabetic kidney complication: Secondary | ICD-10-CM | POA: Diagnosis not present

## 2017-04-12 DIAGNOSIS — I10 Essential (primary) hypertension: Secondary | ICD-10-CM | POA: Diagnosis not present

## 2017-04-12 DIAGNOSIS — E1122 Type 2 diabetes mellitus with diabetic chronic kidney disease: Secondary | ICD-10-CM | POA: Diagnosis not present

## 2017-04-12 DIAGNOSIS — G546 Phantom limb syndrome with pain: Secondary | ICD-10-CM | POA: Diagnosis not present

## 2017-04-12 DIAGNOSIS — N186 End stage renal disease: Secondary | ICD-10-CM | POA: Diagnosis not present

## 2017-04-13 DIAGNOSIS — D509 Iron deficiency anemia, unspecified: Secondary | ICD-10-CM | POA: Diagnosis not present

## 2017-04-13 DIAGNOSIS — N2581 Secondary hyperparathyroidism of renal origin: Secondary | ICD-10-CM | POA: Diagnosis not present

## 2017-04-13 DIAGNOSIS — E1129 Type 2 diabetes mellitus with other diabetic kidney complication: Secondary | ICD-10-CM | POA: Diagnosis not present

## 2017-04-13 DIAGNOSIS — D631 Anemia in chronic kidney disease: Secondary | ICD-10-CM | POA: Diagnosis not present

## 2017-04-13 DIAGNOSIS — N186 End stage renal disease: Secondary | ICD-10-CM | POA: Diagnosis not present

## 2017-04-13 DIAGNOSIS — Z23 Encounter for immunization: Secondary | ICD-10-CM | POA: Diagnosis not present

## 2017-04-15 DIAGNOSIS — D509 Iron deficiency anemia, unspecified: Secondary | ICD-10-CM | POA: Diagnosis not present

## 2017-04-15 DIAGNOSIS — Z23 Encounter for immunization: Secondary | ICD-10-CM | POA: Diagnosis not present

## 2017-04-15 DIAGNOSIS — D631 Anemia in chronic kidney disease: Secondary | ICD-10-CM | POA: Diagnosis not present

## 2017-04-15 DIAGNOSIS — N2581 Secondary hyperparathyroidism of renal origin: Secondary | ICD-10-CM | POA: Diagnosis not present

## 2017-04-15 DIAGNOSIS — N186 End stage renal disease: Secondary | ICD-10-CM | POA: Diagnosis not present

## 2017-04-15 DIAGNOSIS — E1129 Type 2 diabetes mellitus with other diabetic kidney complication: Secondary | ICD-10-CM | POA: Diagnosis not present

## 2017-04-17 DIAGNOSIS — N2581 Secondary hyperparathyroidism of renal origin: Secondary | ICD-10-CM | POA: Diagnosis not present

## 2017-04-17 DIAGNOSIS — D509 Iron deficiency anemia, unspecified: Secondary | ICD-10-CM | POA: Diagnosis not present

## 2017-04-17 DIAGNOSIS — N186 End stage renal disease: Secondary | ICD-10-CM | POA: Diagnosis not present

## 2017-04-17 DIAGNOSIS — D631 Anemia in chronic kidney disease: Secondary | ICD-10-CM | POA: Diagnosis not present

## 2017-04-17 DIAGNOSIS — E1129 Type 2 diabetes mellitus with other diabetic kidney complication: Secondary | ICD-10-CM | POA: Diagnosis not present

## 2017-04-17 DIAGNOSIS — Z23 Encounter for immunization: Secondary | ICD-10-CM | POA: Diagnosis not present

## 2017-04-18 DIAGNOSIS — N186 End stage renal disease: Secondary | ICD-10-CM | POA: Diagnosis not present

## 2017-04-18 DIAGNOSIS — Z992 Dependence on renal dialysis: Secondary | ICD-10-CM | POA: Diagnosis not present

## 2017-04-18 DIAGNOSIS — E1129 Type 2 diabetes mellitus with other diabetic kidney complication: Secondary | ICD-10-CM | POA: Diagnosis not present

## 2017-04-20 DIAGNOSIS — N186 End stage renal disease: Secondary | ICD-10-CM | POA: Diagnosis not present

## 2017-04-20 DIAGNOSIS — E1129 Type 2 diabetes mellitus with other diabetic kidney complication: Secondary | ICD-10-CM | POA: Diagnosis not present

## 2017-04-20 DIAGNOSIS — D509 Iron deficiency anemia, unspecified: Secondary | ICD-10-CM | POA: Diagnosis not present

## 2017-04-20 DIAGNOSIS — N2581 Secondary hyperparathyroidism of renal origin: Secondary | ICD-10-CM | POA: Diagnosis not present

## 2017-04-22 DIAGNOSIS — E1129 Type 2 diabetes mellitus with other diabetic kidney complication: Secondary | ICD-10-CM | POA: Diagnosis not present

## 2017-04-22 DIAGNOSIS — D509 Iron deficiency anemia, unspecified: Secondary | ICD-10-CM | POA: Diagnosis not present

## 2017-04-22 DIAGNOSIS — N2581 Secondary hyperparathyroidism of renal origin: Secondary | ICD-10-CM | POA: Diagnosis not present

## 2017-04-22 DIAGNOSIS — N186 End stage renal disease: Secondary | ICD-10-CM | POA: Diagnosis not present

## 2017-04-24 DIAGNOSIS — N2581 Secondary hyperparathyroidism of renal origin: Secondary | ICD-10-CM | POA: Diagnosis not present

## 2017-04-24 DIAGNOSIS — N186 End stage renal disease: Secondary | ICD-10-CM | POA: Diagnosis not present

## 2017-04-24 DIAGNOSIS — E1129 Type 2 diabetes mellitus with other diabetic kidney complication: Secondary | ICD-10-CM | POA: Diagnosis not present

## 2017-04-24 DIAGNOSIS — D509 Iron deficiency anemia, unspecified: Secondary | ICD-10-CM | POA: Diagnosis not present

## 2017-04-27 DIAGNOSIS — E1129 Type 2 diabetes mellitus with other diabetic kidney complication: Secondary | ICD-10-CM | POA: Diagnosis not present

## 2017-04-27 DIAGNOSIS — D509 Iron deficiency anemia, unspecified: Secondary | ICD-10-CM | POA: Diagnosis not present

## 2017-04-27 DIAGNOSIS — N2581 Secondary hyperparathyroidism of renal origin: Secondary | ICD-10-CM | POA: Diagnosis not present

## 2017-04-27 DIAGNOSIS — N186 End stage renal disease: Secondary | ICD-10-CM | POA: Diagnosis not present

## 2017-04-29 DIAGNOSIS — N186 End stage renal disease: Secondary | ICD-10-CM | POA: Diagnosis not present

## 2017-04-29 DIAGNOSIS — E1129 Type 2 diabetes mellitus with other diabetic kidney complication: Secondary | ICD-10-CM | POA: Diagnosis not present

## 2017-04-29 DIAGNOSIS — N2581 Secondary hyperparathyroidism of renal origin: Secondary | ICD-10-CM | POA: Diagnosis not present

## 2017-04-29 DIAGNOSIS — D509 Iron deficiency anemia, unspecified: Secondary | ICD-10-CM | POA: Diagnosis not present

## 2017-05-01 DIAGNOSIS — N2581 Secondary hyperparathyroidism of renal origin: Secondary | ICD-10-CM | POA: Diagnosis not present

## 2017-05-01 DIAGNOSIS — D509 Iron deficiency anemia, unspecified: Secondary | ICD-10-CM | POA: Diagnosis not present

## 2017-05-01 DIAGNOSIS — E1129 Type 2 diabetes mellitus with other diabetic kidney complication: Secondary | ICD-10-CM | POA: Diagnosis not present

## 2017-05-01 DIAGNOSIS — N186 End stage renal disease: Secondary | ICD-10-CM | POA: Diagnosis not present

## 2017-05-04 DIAGNOSIS — E1129 Type 2 diabetes mellitus with other diabetic kidney complication: Secondary | ICD-10-CM | POA: Diagnosis not present

## 2017-05-04 DIAGNOSIS — D509 Iron deficiency anemia, unspecified: Secondary | ICD-10-CM | POA: Diagnosis not present

## 2017-05-04 DIAGNOSIS — N186 End stage renal disease: Secondary | ICD-10-CM | POA: Diagnosis not present

## 2017-05-04 DIAGNOSIS — N2581 Secondary hyperparathyroidism of renal origin: Secondary | ICD-10-CM | POA: Diagnosis not present

## 2017-05-06 DIAGNOSIS — N186 End stage renal disease: Secondary | ICD-10-CM | POA: Diagnosis not present

## 2017-05-06 DIAGNOSIS — N2581 Secondary hyperparathyroidism of renal origin: Secondary | ICD-10-CM | POA: Diagnosis not present

## 2017-05-06 DIAGNOSIS — E1129 Type 2 diabetes mellitus with other diabetic kidney complication: Secondary | ICD-10-CM | POA: Diagnosis not present

## 2017-05-06 DIAGNOSIS — D509 Iron deficiency anemia, unspecified: Secondary | ICD-10-CM | POA: Diagnosis not present

## 2017-05-11 DIAGNOSIS — N186 End stage renal disease: Secondary | ICD-10-CM | POA: Diagnosis not present

## 2017-05-11 DIAGNOSIS — E1129 Type 2 diabetes mellitus with other diabetic kidney complication: Secondary | ICD-10-CM | POA: Diagnosis not present

## 2017-05-11 DIAGNOSIS — N2581 Secondary hyperparathyroidism of renal origin: Secondary | ICD-10-CM | POA: Diagnosis not present

## 2017-05-11 DIAGNOSIS — D509 Iron deficiency anemia, unspecified: Secondary | ICD-10-CM | POA: Diagnosis not present

## 2017-05-13 DIAGNOSIS — N2581 Secondary hyperparathyroidism of renal origin: Secondary | ICD-10-CM | POA: Diagnosis not present

## 2017-05-13 DIAGNOSIS — N186 End stage renal disease: Secondary | ICD-10-CM | POA: Diagnosis not present

## 2017-05-13 DIAGNOSIS — E1129 Type 2 diabetes mellitus with other diabetic kidney complication: Secondary | ICD-10-CM | POA: Diagnosis not present

## 2017-05-13 DIAGNOSIS — D509 Iron deficiency anemia, unspecified: Secondary | ICD-10-CM | POA: Diagnosis not present

## 2017-05-15 DIAGNOSIS — N186 End stage renal disease: Secondary | ICD-10-CM | POA: Diagnosis not present

## 2017-05-15 DIAGNOSIS — E1129 Type 2 diabetes mellitus with other diabetic kidney complication: Secondary | ICD-10-CM | POA: Diagnosis not present

## 2017-05-15 DIAGNOSIS — N2581 Secondary hyperparathyroidism of renal origin: Secondary | ICD-10-CM | POA: Diagnosis not present

## 2017-05-15 DIAGNOSIS — D509 Iron deficiency anemia, unspecified: Secondary | ICD-10-CM | POA: Diagnosis not present

## 2017-05-17 ENCOUNTER — Encounter: Payer: Self-pay | Admitting: Podiatry

## 2017-05-17 ENCOUNTER — Ambulatory Visit (INDEPENDENT_AMBULATORY_CARE_PROVIDER_SITE_OTHER): Payer: Medicare Other | Admitting: Podiatry

## 2017-05-17 DIAGNOSIS — B351 Tinea unguium: Secondary | ICD-10-CM | POA: Diagnosis not present

## 2017-05-17 DIAGNOSIS — E1151 Type 2 diabetes mellitus with diabetic peripheral angiopathy without gangrene: Secondary | ICD-10-CM

## 2017-05-17 DIAGNOSIS — M79675 Pain in left toe(s): Secondary | ICD-10-CM

## 2017-05-17 DIAGNOSIS — M79674 Pain in right toe(s): Secondary | ICD-10-CM | POA: Diagnosis not present

## 2017-05-18 DIAGNOSIS — N186 End stage renal disease: Secondary | ICD-10-CM | POA: Diagnosis not present

## 2017-05-18 DIAGNOSIS — D509 Iron deficiency anemia, unspecified: Secondary | ICD-10-CM | POA: Diagnosis not present

## 2017-05-18 DIAGNOSIS — N2581 Secondary hyperparathyroidism of renal origin: Secondary | ICD-10-CM | POA: Diagnosis not present

## 2017-05-18 DIAGNOSIS — E1129 Type 2 diabetes mellitus with other diabetic kidney complication: Secondary | ICD-10-CM | POA: Diagnosis not present

## 2017-05-19 DIAGNOSIS — N186 End stage renal disease: Secondary | ICD-10-CM | POA: Diagnosis not present

## 2017-05-19 DIAGNOSIS — Z992 Dependence on renal dialysis: Secondary | ICD-10-CM | POA: Diagnosis not present

## 2017-05-19 DIAGNOSIS — E1129 Type 2 diabetes mellitus with other diabetic kidney complication: Secondary | ICD-10-CM | POA: Diagnosis not present

## 2017-05-20 DIAGNOSIS — D631 Anemia in chronic kidney disease: Secondary | ICD-10-CM | POA: Diagnosis not present

## 2017-05-20 DIAGNOSIS — D509 Iron deficiency anemia, unspecified: Secondary | ICD-10-CM | POA: Diagnosis not present

## 2017-05-20 DIAGNOSIS — N186 End stage renal disease: Secondary | ICD-10-CM | POA: Diagnosis not present

## 2017-05-20 DIAGNOSIS — E1129 Type 2 diabetes mellitus with other diabetic kidney complication: Secondary | ICD-10-CM | POA: Diagnosis not present

## 2017-05-20 DIAGNOSIS — N2581 Secondary hyperparathyroidism of renal origin: Secondary | ICD-10-CM | POA: Diagnosis not present

## 2017-05-20 NOTE — Progress Notes (Signed)
Subjective: 82 y.o. returns the office today for painful, elongated, thickened toenails which she cannot trim herself. Denies any redness or drainage around the nails. Denies any acute changes since last appointment and no new complaints today. Denies any systemic complaints such as fevers, chills, nausea, vomiting.   PCP: Mirna MiresHill, Gerald, MD  Objective: AAO 3, NAD L BKA DP/PT pulses decreased Protective sensation decreased with Simms Weinstein monofilament, Achilles tendon reflex intact.  Nails hypertrophic, dystrophic, elongated, brittle, discolored 5. There is tenderness overlying the nails 1-5 on the right. There is no surrounding erythema or drainage along the nail sites. No open lesions or pre-ulcerative lesions are identified. No other areas of tenderness bilateral lower extremities. No overlying edema, erythema, increased warmth. No pain with calf compression, swelling, warmth, erythema.  Assessment: Patient presents with symptomatic onychomycosis  Plan: -Treatment options including alternatives, risks, complications were discussed -Nails sharply debrided 5 without complication/bleeding. -Discussed daily foot inspection. If there are any changes, to call the office immediately.  -Follow-up in 3 months or sooner if any problems are to arise. In the meantime, encouraged to call the office with any questions, concerns, changes symptoms.  Ovid CurdMatthew Maksym Pfiffner, DPM

## 2017-05-22 DIAGNOSIS — E1129 Type 2 diabetes mellitus with other diabetic kidney complication: Secondary | ICD-10-CM | POA: Diagnosis not present

## 2017-05-22 DIAGNOSIS — D509 Iron deficiency anemia, unspecified: Secondary | ICD-10-CM | POA: Diagnosis not present

## 2017-05-22 DIAGNOSIS — N186 End stage renal disease: Secondary | ICD-10-CM | POA: Diagnosis not present

## 2017-05-22 DIAGNOSIS — D631 Anemia in chronic kidney disease: Secondary | ICD-10-CM | POA: Diagnosis not present

## 2017-05-22 DIAGNOSIS — N2581 Secondary hyperparathyroidism of renal origin: Secondary | ICD-10-CM | POA: Diagnosis not present

## 2017-05-25 DIAGNOSIS — E1129 Type 2 diabetes mellitus with other diabetic kidney complication: Secondary | ICD-10-CM | POA: Diagnosis not present

## 2017-05-25 DIAGNOSIS — D509 Iron deficiency anemia, unspecified: Secondary | ICD-10-CM | POA: Diagnosis not present

## 2017-05-25 DIAGNOSIS — N186 End stage renal disease: Secondary | ICD-10-CM | POA: Diagnosis not present

## 2017-05-25 DIAGNOSIS — N2581 Secondary hyperparathyroidism of renal origin: Secondary | ICD-10-CM | POA: Diagnosis not present

## 2017-05-25 DIAGNOSIS — D631 Anemia in chronic kidney disease: Secondary | ICD-10-CM | POA: Diagnosis not present

## 2017-05-27 DIAGNOSIS — D509 Iron deficiency anemia, unspecified: Secondary | ICD-10-CM | POA: Diagnosis not present

## 2017-05-27 DIAGNOSIS — N2581 Secondary hyperparathyroidism of renal origin: Secondary | ICD-10-CM | POA: Diagnosis not present

## 2017-05-27 DIAGNOSIS — E1129 Type 2 diabetes mellitus with other diabetic kidney complication: Secondary | ICD-10-CM | POA: Diagnosis not present

## 2017-05-27 DIAGNOSIS — N186 End stage renal disease: Secondary | ICD-10-CM | POA: Diagnosis not present

## 2017-05-27 DIAGNOSIS — D631 Anemia in chronic kidney disease: Secondary | ICD-10-CM | POA: Diagnosis not present

## 2017-05-29 DIAGNOSIS — N186 End stage renal disease: Secondary | ICD-10-CM | POA: Diagnosis not present

## 2017-05-29 DIAGNOSIS — E1129 Type 2 diabetes mellitus with other diabetic kidney complication: Secondary | ICD-10-CM | POA: Diagnosis not present

## 2017-05-29 DIAGNOSIS — D631 Anemia in chronic kidney disease: Secondary | ICD-10-CM | POA: Diagnosis not present

## 2017-05-29 DIAGNOSIS — D509 Iron deficiency anemia, unspecified: Secondary | ICD-10-CM | POA: Diagnosis not present

## 2017-05-29 DIAGNOSIS — N2581 Secondary hyperparathyroidism of renal origin: Secondary | ICD-10-CM | POA: Diagnosis not present

## 2017-06-01 DIAGNOSIS — D631 Anemia in chronic kidney disease: Secondary | ICD-10-CM | POA: Diagnosis not present

## 2017-06-01 DIAGNOSIS — E1129 Type 2 diabetes mellitus with other diabetic kidney complication: Secondary | ICD-10-CM | POA: Diagnosis not present

## 2017-06-01 DIAGNOSIS — N186 End stage renal disease: Secondary | ICD-10-CM | POA: Diagnosis not present

## 2017-06-01 DIAGNOSIS — N2581 Secondary hyperparathyroidism of renal origin: Secondary | ICD-10-CM | POA: Diagnosis not present

## 2017-06-01 DIAGNOSIS — D509 Iron deficiency anemia, unspecified: Secondary | ICD-10-CM | POA: Diagnosis not present

## 2017-06-03 DIAGNOSIS — D631 Anemia in chronic kidney disease: Secondary | ICD-10-CM | POA: Diagnosis not present

## 2017-06-03 DIAGNOSIS — D509 Iron deficiency anemia, unspecified: Secondary | ICD-10-CM | POA: Diagnosis not present

## 2017-06-03 DIAGNOSIS — N186 End stage renal disease: Secondary | ICD-10-CM | POA: Diagnosis not present

## 2017-06-03 DIAGNOSIS — E1129 Type 2 diabetes mellitus with other diabetic kidney complication: Secondary | ICD-10-CM | POA: Diagnosis not present

## 2017-06-03 DIAGNOSIS — N2581 Secondary hyperparathyroidism of renal origin: Secondary | ICD-10-CM | POA: Diagnosis not present

## 2017-06-05 DIAGNOSIS — N2581 Secondary hyperparathyroidism of renal origin: Secondary | ICD-10-CM | POA: Diagnosis not present

## 2017-06-05 DIAGNOSIS — D509 Iron deficiency anemia, unspecified: Secondary | ICD-10-CM | POA: Diagnosis not present

## 2017-06-05 DIAGNOSIS — E1129 Type 2 diabetes mellitus with other diabetic kidney complication: Secondary | ICD-10-CM | POA: Diagnosis not present

## 2017-06-05 DIAGNOSIS — D631 Anemia in chronic kidney disease: Secondary | ICD-10-CM | POA: Diagnosis not present

## 2017-06-05 DIAGNOSIS — N186 End stage renal disease: Secondary | ICD-10-CM | POA: Diagnosis not present

## 2017-06-07 DIAGNOSIS — N186 End stage renal disease: Secondary | ICD-10-CM | POA: Diagnosis not present

## 2017-06-07 DIAGNOSIS — D509 Iron deficiency anemia, unspecified: Secondary | ICD-10-CM | POA: Diagnosis not present

## 2017-06-07 DIAGNOSIS — E1129 Type 2 diabetes mellitus with other diabetic kidney complication: Secondary | ICD-10-CM | POA: Diagnosis not present

## 2017-06-07 DIAGNOSIS — D631 Anemia in chronic kidney disease: Secondary | ICD-10-CM | POA: Diagnosis not present

## 2017-06-07 DIAGNOSIS — N2581 Secondary hyperparathyroidism of renal origin: Secondary | ICD-10-CM | POA: Diagnosis not present

## 2017-06-09 DIAGNOSIS — E1129 Type 2 diabetes mellitus with other diabetic kidney complication: Secondary | ICD-10-CM | POA: Diagnosis not present

## 2017-06-09 DIAGNOSIS — D631 Anemia in chronic kidney disease: Secondary | ICD-10-CM | POA: Diagnosis not present

## 2017-06-09 DIAGNOSIS — N2581 Secondary hyperparathyroidism of renal origin: Secondary | ICD-10-CM | POA: Diagnosis not present

## 2017-06-09 DIAGNOSIS — N186 End stage renal disease: Secondary | ICD-10-CM | POA: Diagnosis not present

## 2017-06-09 DIAGNOSIS — D509 Iron deficiency anemia, unspecified: Secondary | ICD-10-CM | POA: Diagnosis not present

## 2017-06-12 DIAGNOSIS — D509 Iron deficiency anemia, unspecified: Secondary | ICD-10-CM | POA: Diagnosis not present

## 2017-06-12 DIAGNOSIS — N2581 Secondary hyperparathyroidism of renal origin: Secondary | ICD-10-CM | POA: Diagnosis not present

## 2017-06-12 DIAGNOSIS — E1129 Type 2 diabetes mellitus with other diabetic kidney complication: Secondary | ICD-10-CM | POA: Diagnosis not present

## 2017-06-12 DIAGNOSIS — D631 Anemia in chronic kidney disease: Secondary | ICD-10-CM | POA: Diagnosis not present

## 2017-06-12 DIAGNOSIS — N186 End stage renal disease: Secondary | ICD-10-CM | POA: Diagnosis not present

## 2017-06-15 DIAGNOSIS — D631 Anemia in chronic kidney disease: Secondary | ICD-10-CM | POA: Diagnosis not present

## 2017-06-15 DIAGNOSIS — N2581 Secondary hyperparathyroidism of renal origin: Secondary | ICD-10-CM | POA: Diagnosis not present

## 2017-06-15 DIAGNOSIS — D509 Iron deficiency anemia, unspecified: Secondary | ICD-10-CM | POA: Diagnosis not present

## 2017-06-15 DIAGNOSIS — N186 End stage renal disease: Secondary | ICD-10-CM | POA: Diagnosis not present

## 2017-06-15 DIAGNOSIS — E1129 Type 2 diabetes mellitus with other diabetic kidney complication: Secondary | ICD-10-CM | POA: Diagnosis not present

## 2017-06-17 DIAGNOSIS — N186 End stage renal disease: Secondary | ICD-10-CM | POA: Diagnosis not present

## 2017-06-17 DIAGNOSIS — E1129 Type 2 diabetes mellitus with other diabetic kidney complication: Secondary | ICD-10-CM | POA: Diagnosis not present

## 2017-06-17 DIAGNOSIS — D509 Iron deficiency anemia, unspecified: Secondary | ICD-10-CM | POA: Diagnosis not present

## 2017-06-17 DIAGNOSIS — D631 Anemia in chronic kidney disease: Secondary | ICD-10-CM | POA: Diagnosis not present

## 2017-06-17 DIAGNOSIS — N2581 Secondary hyperparathyroidism of renal origin: Secondary | ICD-10-CM | POA: Diagnosis not present

## 2017-06-18 DIAGNOSIS — E1129 Type 2 diabetes mellitus with other diabetic kidney complication: Secondary | ICD-10-CM | POA: Diagnosis not present

## 2017-06-18 DIAGNOSIS — N186 End stage renal disease: Secondary | ICD-10-CM | POA: Diagnosis not present

## 2017-06-18 DIAGNOSIS — Z992 Dependence on renal dialysis: Secondary | ICD-10-CM | POA: Diagnosis not present

## 2017-06-19 DIAGNOSIS — D631 Anemia in chronic kidney disease: Secondary | ICD-10-CM | POA: Diagnosis not present

## 2017-06-19 DIAGNOSIS — E1129 Type 2 diabetes mellitus with other diabetic kidney complication: Secondary | ICD-10-CM | POA: Diagnosis not present

## 2017-06-19 DIAGNOSIS — N2581 Secondary hyperparathyroidism of renal origin: Secondary | ICD-10-CM | POA: Diagnosis not present

## 2017-06-19 DIAGNOSIS — D509 Iron deficiency anemia, unspecified: Secondary | ICD-10-CM | POA: Diagnosis not present

## 2017-06-19 DIAGNOSIS — N186 End stage renal disease: Secondary | ICD-10-CM | POA: Diagnosis not present

## 2017-06-22 DIAGNOSIS — N186 End stage renal disease: Secondary | ICD-10-CM | POA: Diagnosis not present

## 2017-06-22 DIAGNOSIS — E1129 Type 2 diabetes mellitus with other diabetic kidney complication: Secondary | ICD-10-CM | POA: Diagnosis not present

## 2017-06-22 DIAGNOSIS — D631 Anemia in chronic kidney disease: Secondary | ICD-10-CM | POA: Diagnosis not present

## 2017-06-22 DIAGNOSIS — N2581 Secondary hyperparathyroidism of renal origin: Secondary | ICD-10-CM | POA: Diagnosis not present

## 2017-06-22 DIAGNOSIS — D509 Iron deficiency anemia, unspecified: Secondary | ICD-10-CM | POA: Diagnosis not present

## 2017-06-24 DIAGNOSIS — E1129 Type 2 diabetes mellitus with other diabetic kidney complication: Secondary | ICD-10-CM | POA: Diagnosis not present

## 2017-06-24 DIAGNOSIS — D631 Anemia in chronic kidney disease: Secondary | ICD-10-CM | POA: Diagnosis not present

## 2017-06-24 DIAGNOSIS — N2581 Secondary hyperparathyroidism of renal origin: Secondary | ICD-10-CM | POA: Diagnosis not present

## 2017-06-24 DIAGNOSIS — N186 End stage renal disease: Secondary | ICD-10-CM | POA: Diagnosis not present

## 2017-06-24 DIAGNOSIS — D509 Iron deficiency anemia, unspecified: Secondary | ICD-10-CM | POA: Diagnosis not present

## 2017-06-26 DIAGNOSIS — D631 Anemia in chronic kidney disease: Secondary | ICD-10-CM | POA: Diagnosis not present

## 2017-06-26 DIAGNOSIS — D509 Iron deficiency anemia, unspecified: Secondary | ICD-10-CM | POA: Diagnosis not present

## 2017-06-26 DIAGNOSIS — E1129 Type 2 diabetes mellitus with other diabetic kidney complication: Secondary | ICD-10-CM | POA: Diagnosis not present

## 2017-06-26 DIAGNOSIS — N186 End stage renal disease: Secondary | ICD-10-CM | POA: Diagnosis not present

## 2017-06-26 DIAGNOSIS — N2581 Secondary hyperparathyroidism of renal origin: Secondary | ICD-10-CM | POA: Diagnosis not present

## 2017-07-01 DIAGNOSIS — N2581 Secondary hyperparathyroidism of renal origin: Secondary | ICD-10-CM | POA: Diagnosis not present

## 2017-07-01 DIAGNOSIS — D631 Anemia in chronic kidney disease: Secondary | ICD-10-CM | POA: Diagnosis not present

## 2017-07-01 DIAGNOSIS — N186 End stage renal disease: Secondary | ICD-10-CM | POA: Diagnosis not present

## 2017-07-01 DIAGNOSIS — E1129 Type 2 diabetes mellitus with other diabetic kidney complication: Secondary | ICD-10-CM | POA: Diagnosis not present

## 2017-07-01 DIAGNOSIS — D509 Iron deficiency anemia, unspecified: Secondary | ICD-10-CM | POA: Diagnosis not present

## 2017-07-03 DIAGNOSIS — E1129 Type 2 diabetes mellitus with other diabetic kidney complication: Secondary | ICD-10-CM | POA: Diagnosis not present

## 2017-07-03 DIAGNOSIS — N186 End stage renal disease: Secondary | ICD-10-CM | POA: Diagnosis not present

## 2017-07-03 DIAGNOSIS — D509 Iron deficiency anemia, unspecified: Secondary | ICD-10-CM | POA: Diagnosis not present

## 2017-07-03 DIAGNOSIS — N2581 Secondary hyperparathyroidism of renal origin: Secondary | ICD-10-CM | POA: Diagnosis not present

## 2017-07-03 DIAGNOSIS — D631 Anemia in chronic kidney disease: Secondary | ICD-10-CM | POA: Diagnosis not present

## 2017-07-06 DIAGNOSIS — N186 End stage renal disease: Secondary | ICD-10-CM | POA: Diagnosis not present

## 2017-07-06 DIAGNOSIS — D631 Anemia in chronic kidney disease: Secondary | ICD-10-CM | POA: Diagnosis not present

## 2017-07-06 DIAGNOSIS — D509 Iron deficiency anemia, unspecified: Secondary | ICD-10-CM | POA: Diagnosis not present

## 2017-07-06 DIAGNOSIS — E1129 Type 2 diabetes mellitus with other diabetic kidney complication: Secondary | ICD-10-CM | POA: Diagnosis not present

## 2017-07-06 DIAGNOSIS — N2581 Secondary hyperparathyroidism of renal origin: Secondary | ICD-10-CM | POA: Diagnosis not present

## 2017-07-08 DIAGNOSIS — N2581 Secondary hyperparathyroidism of renal origin: Secondary | ICD-10-CM | POA: Diagnosis not present

## 2017-07-08 DIAGNOSIS — E1129 Type 2 diabetes mellitus with other diabetic kidney complication: Secondary | ICD-10-CM | POA: Diagnosis not present

## 2017-07-08 DIAGNOSIS — N186 End stage renal disease: Secondary | ICD-10-CM | POA: Diagnosis not present

## 2017-07-08 DIAGNOSIS — D509 Iron deficiency anemia, unspecified: Secondary | ICD-10-CM | POA: Diagnosis not present

## 2017-07-08 DIAGNOSIS — D631 Anemia in chronic kidney disease: Secondary | ICD-10-CM | POA: Diagnosis not present

## 2017-07-10 DIAGNOSIS — E1129 Type 2 diabetes mellitus with other diabetic kidney complication: Secondary | ICD-10-CM | POA: Diagnosis not present

## 2017-07-10 DIAGNOSIS — D509 Iron deficiency anemia, unspecified: Secondary | ICD-10-CM | POA: Diagnosis not present

## 2017-07-10 DIAGNOSIS — D631 Anemia in chronic kidney disease: Secondary | ICD-10-CM | POA: Diagnosis not present

## 2017-07-10 DIAGNOSIS — N186 End stage renal disease: Secondary | ICD-10-CM | POA: Diagnosis not present

## 2017-07-10 DIAGNOSIS — N2581 Secondary hyperparathyroidism of renal origin: Secondary | ICD-10-CM | POA: Diagnosis not present

## 2017-07-12 DIAGNOSIS — N2581 Secondary hyperparathyroidism of renal origin: Secondary | ICD-10-CM | POA: Diagnosis not present

## 2017-07-12 DIAGNOSIS — E1129 Type 2 diabetes mellitus with other diabetic kidney complication: Secondary | ICD-10-CM | POA: Diagnosis not present

## 2017-07-12 DIAGNOSIS — D631 Anemia in chronic kidney disease: Secondary | ICD-10-CM | POA: Diagnosis not present

## 2017-07-12 DIAGNOSIS — N186 End stage renal disease: Secondary | ICD-10-CM | POA: Diagnosis not present

## 2017-07-12 DIAGNOSIS — D509 Iron deficiency anemia, unspecified: Secondary | ICD-10-CM | POA: Diagnosis not present

## 2017-07-15 DIAGNOSIS — D509 Iron deficiency anemia, unspecified: Secondary | ICD-10-CM | POA: Diagnosis not present

## 2017-07-15 DIAGNOSIS — D631 Anemia in chronic kidney disease: Secondary | ICD-10-CM | POA: Diagnosis not present

## 2017-07-15 DIAGNOSIS — N186 End stage renal disease: Secondary | ICD-10-CM | POA: Diagnosis not present

## 2017-07-15 DIAGNOSIS — E1129 Type 2 diabetes mellitus with other diabetic kidney complication: Secondary | ICD-10-CM | POA: Diagnosis not present

## 2017-07-15 DIAGNOSIS — N2581 Secondary hyperparathyroidism of renal origin: Secondary | ICD-10-CM | POA: Diagnosis not present

## 2017-07-16 DIAGNOSIS — I499 Cardiac arrhythmia, unspecified: Secondary | ICD-10-CM | POA: Diagnosis not present

## 2017-07-16 DIAGNOSIS — N186 End stage renal disease: Secondary | ICD-10-CM | POA: Diagnosis not present

## 2017-07-16 DIAGNOSIS — G546 Phantom limb syndrome with pain: Secondary | ICD-10-CM | POA: Diagnosis not present

## 2017-07-16 DIAGNOSIS — Z89512 Acquired absence of left leg below knee: Secondary | ICD-10-CM | POA: Diagnosis not present

## 2017-07-16 DIAGNOSIS — I10 Essential (primary) hypertension: Secondary | ICD-10-CM | POA: Diagnosis not present

## 2017-07-16 DIAGNOSIS — I4891 Unspecified atrial fibrillation: Secondary | ICD-10-CM | POA: Diagnosis not present

## 2017-07-16 DIAGNOSIS — E1122 Type 2 diabetes mellitus with diabetic chronic kidney disease: Secondary | ICD-10-CM | POA: Diagnosis not present

## 2017-07-17 DIAGNOSIS — D509 Iron deficiency anemia, unspecified: Secondary | ICD-10-CM | POA: Diagnosis not present

## 2017-07-17 DIAGNOSIS — N186 End stage renal disease: Secondary | ICD-10-CM | POA: Diagnosis not present

## 2017-07-17 DIAGNOSIS — N2581 Secondary hyperparathyroidism of renal origin: Secondary | ICD-10-CM | POA: Diagnosis not present

## 2017-07-17 DIAGNOSIS — D631 Anemia in chronic kidney disease: Secondary | ICD-10-CM | POA: Diagnosis not present

## 2017-07-17 DIAGNOSIS — E1129 Type 2 diabetes mellitus with other diabetic kidney complication: Secondary | ICD-10-CM | POA: Diagnosis not present

## 2017-07-19 ENCOUNTER — Ambulatory Visit: Payer: Medicare Other | Admitting: Internal Medicine

## 2017-07-19 DIAGNOSIS — D631 Anemia in chronic kidney disease: Secondary | ICD-10-CM | POA: Diagnosis not present

## 2017-07-19 DIAGNOSIS — D509 Iron deficiency anemia, unspecified: Secondary | ICD-10-CM | POA: Diagnosis not present

## 2017-07-19 DIAGNOSIS — E1129 Type 2 diabetes mellitus with other diabetic kidney complication: Secondary | ICD-10-CM | POA: Diagnosis not present

## 2017-07-19 DIAGNOSIS — N2581 Secondary hyperparathyroidism of renal origin: Secondary | ICD-10-CM | POA: Diagnosis not present

## 2017-07-19 DIAGNOSIS — N186 End stage renal disease: Secondary | ICD-10-CM | POA: Diagnosis not present

## 2017-07-19 DIAGNOSIS — Z992 Dependence on renal dialysis: Secondary | ICD-10-CM | POA: Diagnosis not present

## 2017-07-22 DIAGNOSIS — D509 Iron deficiency anemia, unspecified: Secondary | ICD-10-CM | POA: Diagnosis not present

## 2017-07-22 DIAGNOSIS — N186 End stage renal disease: Secondary | ICD-10-CM | POA: Diagnosis not present

## 2017-07-22 DIAGNOSIS — E1129 Type 2 diabetes mellitus with other diabetic kidney complication: Secondary | ICD-10-CM | POA: Diagnosis not present

## 2017-07-22 DIAGNOSIS — N2581 Secondary hyperparathyroidism of renal origin: Secondary | ICD-10-CM | POA: Diagnosis not present

## 2017-07-23 ENCOUNTER — Ambulatory Visit (INDEPENDENT_AMBULATORY_CARE_PROVIDER_SITE_OTHER): Payer: Medicare Other | Admitting: Internal Medicine

## 2017-07-23 ENCOUNTER — Encounter: Payer: Self-pay | Admitting: Internal Medicine

## 2017-07-23 VITALS — BP 100/56 | HR 101 | Ht 69.0 in | Wt 132.0 lb

## 2017-07-23 DIAGNOSIS — I4891 Unspecified atrial fibrillation: Secondary | ICD-10-CM

## 2017-07-23 DIAGNOSIS — I739 Peripheral vascular disease, unspecified: Secondary | ICD-10-CM | POA: Diagnosis not present

## 2017-07-23 DIAGNOSIS — Z79899 Other long term (current) drug therapy: Secondary | ICD-10-CM | POA: Insufficient documentation

## 2017-07-23 DIAGNOSIS — I10 Essential (primary) hypertension: Secondary | ICD-10-CM | POA: Diagnosis not present

## 2017-07-23 DIAGNOSIS — E782 Mixed hyperlipidemia: Secondary | ICD-10-CM

## 2017-07-23 MED ORDER — WARFARIN SODIUM 5 MG PO TABS: 5.0000 mg | ORAL_TABLET | Freq: Every day | ORAL | 1 refills | Status: DC

## 2017-07-23 NOTE — Patient Instructions (Addendum)
Your physician has recommended you make the following change in your medication:  -- DO NOT TAKE xarelto 10mg  daily -- START warfarin 5mg  daily as directed  Your physician has requested that you have an echocardiogram @ [address]. Echocardiography is a painless test that uses sound waves to create images of your heart. It provides your doctor with information about the size and shape of your heart and how well your heart's chambers and valves are working. This procedure takes approximately one hour. There are no restrictions for this procedure.  Your physician recommends that you schedule a follow-up appointment with Dr. Rennis GoldenHilty after your echocardiogram.

## 2017-07-23 NOTE — Progress Notes (Signed)
OFFICE CONSULT NOTE  Chief Complaint:  New onset atrial fibrillation  Primary Care Physician: Mirna MiresHill, Gerald, MD  HPI:  Jackie [name] is a 83 y.o. female who is being seen today for the evaluation of atrial fibrillation at the request of Mirna MiresHill, Gerald, MD. This is a pleasant 83 yo female who was recently seen by Dr. Loleta ChanceHill.  She was referred for evaluation of new onset atrial fibrillation.  EKG showed atrial fibrillation with controlled ventricular response.  Today she is in A. fib with heart rate just over 100.  She is asymptomatic with this.  Unfortunately she has end-stage renal disease on dialysis (T/TH/SA), and additional risk factors including hyperlipidemia, hypertension, diabetes, PAD status post interventions in 2016 and is status post left BKA.  Her PCP had started her on Xarelto 10 mg daily.  We had a long discussion about the dose of this medication and the fact that Xarelto is not been studied in end-stage renal disease.  In addition the typical doses for treatment of A. fib are 15 or 20 mg.   PMHx:  Past Medical History:  Diagnosis Date  . Anemia   . Arthritis   . Diabetes mellitus   . End-stage renal disease on hemodialysis (HCC)    dialysis T/Th/Sa  . Hyperlipidemia   . Hypertension   . Pneumonia   . Thyroid disease    pt denies this    Past Surgical History:  Procedure Laterality Date  . AMPUTATION Left 04/24/2015   Procedure: LEFT BELOW KNEE AMPUTATION;  Surgeon: Nadara MustardMarcus Duda V, MD;  Location: [facility] OR;  Service: Orthopedics;  Laterality: Left;  . ARTERIOVENOUS GRAFT PLACEMENT Left    non function  . AV FISTULA PLACEMENT Right 01/30/2013   Procedure: ARTERIOVENOUS (AV) FISTULA CREATION;  Surgeon: Fransisco HertzBrian L Chen, MD;  [facility];  Service: Vascular;  Laterality: Right;  . CESAREAN SECTION    . EYE SURGERY Bilateral    catarct  . FISTULA SUPERFICIALIZATION Right 04/24/2013   Procedure: FISTULA SUPERFICIALIZATION;  Surgeon: Larina Earthlyodd F Early, MD;  [facility];   Service: Vascular;  Laterality: Right;  . FISTULOGRAM Right 07/26/2013   Procedure: FISTULOGRAM;  Surgeon: Larina Earthlyodd F Early, MD;  [facility];  Service: Cardiovascular;  Laterality: Right;  . INSERTION OF DIALYSIS CATHETER Right 04/24/2013   Procedure: INSERTION OF DIALYSIS CATHETER;  Surgeon: Larina Earthlyodd F Early, MD;  [facility];  Service: Vascular;  Laterality: Right;  . PERIPHERAL VASCULAR CATHETERIZATION N/A 12/05/2014   Procedure: Abdominal Aortogram;  Surgeon: Fransisco HertzBrian L Chen, MD;  [facility];  Service: Cardiovascular;  Laterality: N/A;  . PERIPHERAL VASCULAR CATHETERIZATION N/A 03/19/2015   Procedure: Lower Extremity Angiography;  Surgeon: Nada LibmanVance W Brabham, MD;  Location: [facility] INVASIVE CV LAB;  Service: Cardiovascular;  Laterality: N/A;  . PERIPHERAL VASCULAR CATHETERIZATION  03/19/2015   Procedure: Peripheral Vascular Intervention;  Surgeon: Nada LibmanVance W Brabham, MD;  Location: [facility] INVASIVE CV LAB;  Service: Cardiovascular;;  left sfa and popliteal stent  . TONSILLECTOMY      FAMHx:  Family History  Problem Relation Age of Onset  . Diabetes Mother   . Heart disease Mother   . Hypertension Mother     SOCHx:   reports that  has never smoked. she has never used smokeless tobacco. She reports that she does not drink alcohol or use drugs.  ALLERGIES:  No Known Allergies  ROS: Pertinent items noted in HPI and remainder of comprehensive ROS otherwise negative.  HOME  MEDS: Current Outpatient Medications on File Prior to Visit  Medication Sig Dispense Refill  . albuterol (PROVENTIL HFA;VENTOLIN HFA) 108 (90 BASE) MCG/ACT inhaler Inhale 1-2 puffs into the lungs every 6 (six) hours as needed for wheezing. 1 Inhaler 0  . calcium acetate (PHOSLO) 667 MG capsule Take 2 capsules by mouth 3 (three) times daily with meals.    . cefTAZidime 2 g in dextrose 5 % 50 mL Inject 2 g into the vein Every Tuesday,Thursday,and Saturday with dialysis.    Marland Kitchen cinacalcet (SENSIPAR) 30 MG tablet Take  30 mg by mouth daily.    . clopidogrel (PLAVIX) 75 MG tablet TAKE ONE TABLET   BY MOUTH   DAILY 30 tablet 9  . docusate sodium (COLACE) 100 MG capsule Reported on 09/09/2015    . FOSRENOL 1000 MG chewable tablet Chew 2,000 mg by mouth 3 (three) times daily with meals.     . insulin detemir (LEVEMIR) 100 UNIT/ML injection Inject 0.1 mLs (10 Units total) into the skin at bedtime. (Patient taking differently: Inject 0.84ml (10 units) into skin at bedtime to control blood sugar) 10 mL 11  . oxyCODONE-acetaminophen (ROXICET) 5-325 MG tablet Take 1 tablet by mouth every 4 (four) hours as needed for severe pain. 60 tablet 0  . promethazine (PHENERGAN) 12.5 MG tablet Take 1 tablet by mouth daily as needed.    . Protein (PROCEL PO) Two scoops twice daily    . saccharomyces boulardii (FLORASTOR) 250 MG capsule Take 1 capsule (250 mg total) by mouth 2 (two) times daily. 30 capsule 0  . [DISCONTINUED] rivaroxaban (XARELTO) 10 MG TABS tablet Take 10 mg by mouth daily.     No current facility-administered medications on file prior to visit.     LABS/IMAGING: No results found for this or any previous visit (from the past 48 hour(s)). No results found.  LIPID PANEL:    Component Value Date/Time   CHOL (H) 11/17/2009 0342    236        ATP III CLASSIFICATION:  <200     mg/dL   Desirable  161-096  mg/dL   Borderline High  >=045    mg/dL   High          TRIG 409 (H) 11/17/2009 0342   HDL 21 (L) 11/17/2009 0342   CHOLHDL 11.2 11/17/2009 0342   VLDL 59 (H) 11/17/2009 0342   LDLCALC (H) 11/17/2009 0342    156        Total Cholesterol/HDL:CHD Risk Coronary Heart Disease Risk Table                     Men   Women  1/2 Average Risk   3.4   3.3  Average Risk       5.0   4.4  2 X Average Risk   9.6   7.1  3 X Average Risk  23.4   11.0        Use the calculated Patient Ratio above and the CHD Risk Table to determine the patient's CHD Risk.        ATP III CLASSIFICATION (LDL):  <100     mg/dL    Optimal  811-914  mg/dL   Near or Above                    Optimal  130-159  mg/dL   Borderline  782-956  mg/dL   High  >213     mg/dL  Very High    WEIGHTS: Wt Readings from Last 3 Encounters:  07/23/17 132 lb (59.9 kg)  05/17/15 115 lb 3.2 oz (52.3 kg)  04/15/15 130 lb (59 kg)    VITALS: BP (!) 100/56   Pulse (!) 101   Ht 5\' 9"  (1.753 m)   Wt 132 lb (59.9 kg)   BMI 19.49 kg/m   EXAM: General appearance: alert and no distress Neck: no carotid bruit, no JVD and thyroid not enlarged, symmetric, no tenderness/mass/nodules Lungs: diminished breath sounds bilaterally Heart: irregularly irregular rhythm Abdomen: soft, non-tender; bowel sounds normal; no masses,  no organomegaly Extremities: Left BKA Pulses: 2+ and symmetric Skin: Skin color, texture, turgor normal. No rashes or lesions Neurologic: Grossly normal Psych: Pleasant  EKG: Atrial fibrillation with rapid ventricular response at 101, poor R wave progression anteriorly- personally reviewed  ASSESSMENT: 1. Atrial fibrillation with unknown onset 2. End-stage renal disease on hemodialysis 3. Hypertension 4. Dyslipidemia 5. Type 2 diabetes 6. PAD status post left BKA  PLAN: 1.   Mrs. Denio unfortunately has newly identified atrial fibrillation for which she is asymptomatic.  Given her significant comorbidities, end-stage renal disease and PAD, she may not be a candidate for cardioversion.  She was started on Xarelto 10 mg daily, but has not started the medicine yet.  Unfortunately, the literature does not support using Xarelto and end-stage renal disease due to a lack of data.  In addition the study doses for atrial fibrillation are 15 and 20 mg.  We will have to recommend warfarin.  I will have our clinical pharmacist discuss treatment with her today and arrange for INR checks at dialysis.  We will obtain a 2D echo and consider further treatment based on those findings.  If she has significant left atrial  enlargement, I would be hesitant to pursue a rhythm management strategy since she is asymptomatic.  Follow-up with me afterwards.  Thanks again for the kind referral.  Chrystie Nose, MD, [facility]  [place]  [facility]  Medical Director of the [facility]  [facility] Diplomate of the American Board of Clinical Lipidology Attending Cardiologist  Direct Dial: [phone]  Fax: [phone]  Website:  [email or web address] 07/23/2017, 4:25 PM

## 2017-07-24 DIAGNOSIS — D509 Iron deficiency anemia, unspecified: Secondary | ICD-10-CM | POA: Diagnosis not present

## 2017-07-24 DIAGNOSIS — N2581 Secondary hyperparathyroidism of renal origin: Secondary | ICD-10-CM | POA: Diagnosis not present

## 2017-07-24 DIAGNOSIS — E1129 Type 2 diabetes mellitus with other diabetic kidney complication: Secondary | ICD-10-CM | POA: Diagnosis not present

## 2017-07-24 DIAGNOSIS — N186 End stage renal disease: Secondary | ICD-10-CM | POA: Diagnosis not present

## 2017-07-24 LAB — CBC
Hematocrit: 37.3 % (ref 34.0–46.6)
Hemoglobin: 12.2 g/dL (ref 11.1–15.9)
MCH: 29.8 pg (ref 26.6–33.0)
MCHC: 32.7 g/dL (ref 31.5–35.7)
MCV: 91 fL (ref 79–97)
Platelets: 207 10*3/uL (ref 150–379)
RBC: 4.1 x10E6/uL (ref 3.77–5.28)
RDW: 17.2 % — ABNORMAL HIGH (ref 12.3–15.4)
WBC: 4.9 10*3/uL (ref 3.4–10.8)

## 2017-07-24 LAB — BASIC METABOLIC PANEL
BUN / CREAT RATIO: 4 — AB (ref 12–28)
BUN: 24 mg/dL (ref 8–27)
CHLORIDE: 93 mmol/L — AB (ref 96–106)
CO2: 23 mmol/L (ref 20–29)
Calcium: 9.2 mg/dL (ref 8.7–10.3)
Creatinine, Ser: 5.9 mg/dL — ABNORMAL HIGH (ref 0.57–1.00)
GFR, EST AFRICAN AMERICAN: 7 mL/min/{1.73_m2} — AB (ref >59)
GFR, EST NON AFRICAN AMERICAN: 6 mL/min/{1.73_m2} — AB (ref >59)
Glucose: 97 mg/dL (ref 65–99)
Potassium: 5 mmol/L (ref 3.5–5.2)
Sodium: 140 mmol/L (ref 134–144)

## 2017-07-24 LAB — TSH: TSH: 4.39 u[IU]/mL (ref 0.450–4.500)

## 2017-07-27 DIAGNOSIS — N186 End stage renal disease: Secondary | ICD-10-CM | POA: Diagnosis not present

## 2017-07-27 DIAGNOSIS — E1129 Type 2 diabetes mellitus with other diabetic kidney complication: Secondary | ICD-10-CM | POA: Diagnosis not present

## 2017-07-27 DIAGNOSIS — N2581 Secondary hyperparathyroidism of renal origin: Secondary | ICD-10-CM | POA: Diagnosis not present

## 2017-07-27 DIAGNOSIS — D509 Iron deficiency anemia, unspecified: Secondary | ICD-10-CM | POA: Diagnosis not present

## 2017-07-28 ENCOUNTER — Encounter (INDEPENDENT_AMBULATORY_CARE_PROVIDER_SITE_OTHER): Payer: Self-pay | Admitting: Orthopedic Surgery

## 2017-07-28 ENCOUNTER — Ambulatory Visit (INDEPENDENT_AMBULATORY_CARE_PROVIDER_SITE_OTHER): Payer: Medicare Other | Admitting: Orthopedic Surgery

## 2017-07-28 DIAGNOSIS — L97221 Non-pressure chronic ulcer of left calf limited to breakdown of skin: Secondary | ICD-10-CM

## 2017-07-28 DIAGNOSIS — Z89519 Acquired absence of unspecified leg below knee: Secondary | ICD-10-CM | POA: Diagnosis not present

## 2017-07-28 NOTE — Progress Notes (Signed)
Office Visit Note   Patient: Jackie [name]           Date of Birth: 12/14/1934           MRN: [id number] Visit Date: 07/28/2017              Requested by: Mirna MiresHill, Gerald, MD [address] [place], [place] [id number] PCP: Mirna MiresHill, Gerald, MD  Chief Complaint  Patient presents with  . Left Knee - Wound Check      HPI: The patient is an 83 year old woman seen today for evaluation of blistered ulceration to L BKA. Noticed drainaged in her liner a few days ago. States can be out of prosthesis most days but does have to wear to get to her transportation to dialysis three days weekly.   Assessment & Plan: Visit Diagnoses: No diagnosis found.  Plan: begin daily antibacterial ointment dressing changes and wound cleansing. Minimize wear of prosthesis and weight bearing. Follow up in office in 2 weeks.   Follow-Up Instructions: No Follow-up on file.   Ortho Exam  Patient is alert, oriented, no adenopathy, well-dressed, normal affect, normal respiratory effort. On exam of the left below the knee amputation there is a large blistered ulceration this is about 5 cm in diameter with weeping granulation tissue there is no bleeding no erythema no tenderness no sign of infection  Imaging: No results found. No images are attached to the encounter.  Labs: Lab Results  Component Value Date   HGBA1C 6.3 (H) 12/04/2014   HGBA1C (H) 05/22/2010    6.4 (NOTE)                                                                       According to the ADA Clinical Practice Recommendations for 2011, when HbA1c is used as a screening test:   >=6.5%   Diagnostic of Diabetes Mellitus           (if abnormal result  is confirmed)  5.7-6.4%   Increased risk of developing Diabetes Mellitus  References:Diagnosis and Classification of Diabetes Mellitus,Diabetes Care,2011,34(Suppl 1):S62-S69 and Standards of Medical Care in         Diabetes - 2011,Diabetes Care,2011,34  (Suppl 1):S11-S61.   HGBA1C (H) 11/16/2009    7.3 (NOTE)                                                                       According to the ADA Clinical Practice Recommendations for 2011, when HbA1c is used as a screening test:   >=6.5%   Diagnostic of Diabetes Mellitus           (if abnormal result  is confirmed)  5.7-6.4%   Increased risk of developing Diabetes Mellitus  References:Diagnosis and Classification of Diabetes Mellitus,Diabetes Care,2011,34(Suppl 1):S62-S69 and Standards of Medical Care in         Diabetes - 2011,Diabetes Care,2011,34  (Suppl 1):S11-S61.   REPTSTATUS 12/08/2014 FINAL 12/04/2014   GRAMSTAIN  12/04/2014  NO WBC SEEN NO SQUAMOUS EPITHELIAL CELLS SEEN FEW GRAM POSITIVE COCCI IN PAIRS Performed at [facility]    CULT  12/04/2014    MULTIPLE ORGANISMS PRESENT, NONE PREDOMINANT Note: NO STAPHYLOCOCCUS AUREUS ISOLATED NO GROUP A STREP (S.PYOGENES) ISOLATED Performed at [facility]    LABORGA ESCHERICHIA COLI 01/08/2013    @LABSALLVALUES (HGBA1)@  There is no height or weight on file to calculate BMI.  Orders:  No orders of the defined types were placed in this encounter.  No orders of the defined types were placed in this encounter.    Procedures: No procedures performed  Clinical Data: No additional findings.  ROS:  All other systems negative, except as noted in the HPI. Review of Systems  Constitutional: Negative for chills and fever.  Cardiovascular: Negative for leg swelling.  Skin: Positive for wound. Negative for color change and rash.    Objective: Vital Signs: There were no vitals taken for this visit.  Specialty Comments:  No specialty comments available.  PMFS History: Patient Active Problem List   Diagnosis Date Noted  . Atrial fibrillation (HCC) 07/23/2017  . Medication management 07/23/2017  . Essential hypertension 07/23/2017  . Mixed hyperlipidemia 07/23/2017  . PAD (peripheral artery disease) (HCC) 07/23/2017  . S/P unilateral BKA (below  knee amputation) (HCC) 04/24/2015  . 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 Leukocytosis, unspecified 04/22/2013  . HTN (hypertension), benign 04/18/2013  . Diabetes mellitus (HCC) 04/18/2013  . Syncope 04/18/2013  . Anemia 04/18/2013  . End stage renal disease (HCC) 09/02/2011   Past Medical History:  Diagnosis Date  . Anemia   . Arthritis   . Diabetes mellitus   . End-stage renal disease on hemodialysis (HCC)    dialysis T/Th/Sa  . Hyperlipidemia   . Hypertension   . Pneumonia   . Thyroid disease    pt denies this    Family History  Problem Relation Age of Onset  . Diabetes Mother   . Heart disease Mother   . Hypertension Mother     Past Surgical History:  Procedure Laterality Date  . AMPUTATION Left 04/24/2015   Procedure: LEFT BELOW KNEE AMPUTATION;  Surgeon: Nadara Mustard, MD;  Location: [facility] OR;  Service: Orthopedics;  Laterality: Left;  . ARTERIOVENOUS GRAFT PLACEMENT Left    non function  . AV FISTULA PLACEMENT Right 01/30/2013   Procedure: ARTERIOVENOUS (AV) FISTULA CREATION;  Surgeon: Fransisco Hertz, MD;  Location: [facility] OR;  Service: Vascular;  Laterality: Right;  . CESAREAN SECTION    . EYE SURGERY Bilateral    catarct  . FISTULA SUPERFICIALIZATION Right 04/24/2013   Procedure: FISTULA SUPERFICIALIZATION;  Surgeon: Larina Earthly, MD;  Location: [facility] OR;  Service: Vascular;  Laterality: Right;  . FISTULOGRAM Right 07/26/2013   Procedure: FISTULOGRAM;  Surgeon: Larina Earthly, MD;  Location: [facility] CATH LAB;  Service: Cardiovascular;  Laterality: Right;  . INSERTION OF DIALYSIS CATHETER Right 04/24/2013   Procedure: INSERTION OF DIALYSIS CATHETER;  Surgeon: Larina Earthly, MD;  Location: [name] OR;  Service: Vascular;  Laterality: Right;  . PERIPHERAL VASCULAR CATHETERIZATION N/A 12/05/2014   Procedure: Abdominal Aortogram;  Surgeon: Fransisco Hertz, MD;  Location: [facility] INVASIVE CV LAB;  Service: Cardiovascular;  Laterality: N/A;  . PERIPHERAL VASCULAR CATHETERIZATION N/A  03/19/2015   Procedure: Lower Extremity Angiography;  Surgeon: Nada Libman, MD;  Location: [facility] INVASIVE CV LAB;  Service: Cardiovascular;  Laterality: N/A;  . PERIPHERAL VASCULAR CATHETERIZATION  03/19/2015  Procedure: Peripheral Vascular Intervention;  Surgeon: Nada Libman, MD;  Location: [facility] INVASIVE CV LAB;  Service: Cardiovascular;;  left sfa and popliteal stent  . TONSILLECTOMY     Social History   Occupational History  . Not on file  Tobacco Use  . Smoking status: Never Smoker  . Smokeless tobacco: Never Used  Substance and Sexual Activity  . Alcohol use: No  . Drug use: No  . Sexual activity: Not on file

## 2017-07-29 DIAGNOSIS — N186 End stage renal disease: Secondary | ICD-10-CM | POA: Diagnosis not present

## 2017-07-29 DIAGNOSIS — T82868A Thrombosis of vascular prosthetic devices, implants and grafts, initial encounter: Secondary | ICD-10-CM | POA: Diagnosis not present

## 2017-07-29 DIAGNOSIS — Z992 Dependence on renal dialysis: Secondary | ICD-10-CM | POA: Diagnosis not present

## 2017-07-30 ENCOUNTER — Ambulatory Visit (INDEPENDENT_AMBULATORY_CARE_PROVIDER_SITE_OTHER): Payer: Medicare Other | Admitting: Pharmacist

## 2017-07-30 ENCOUNTER — Other Ambulatory Visit: Payer: Self-pay

## 2017-07-30 DIAGNOSIS — Z01812 Encounter for preprocedural laboratory examination: Secondary | ICD-10-CM

## 2017-07-30 DIAGNOSIS — N185 Chronic kidney disease, stage 5: Secondary | ICD-10-CM

## 2017-07-30 DIAGNOSIS — I4891 Unspecified atrial fibrillation: Secondary | ICD-10-CM | POA: Diagnosis not present

## 2017-07-30 DIAGNOSIS — Z7901 Long term (current) use of anticoagulants: Secondary | ICD-10-CM | POA: Insufficient documentation

## 2017-07-30 LAB — POCT INR: INR: 1.1

## 2017-07-31 DIAGNOSIS — D509 Iron deficiency anemia, unspecified: Secondary | ICD-10-CM | POA: Diagnosis not present

## 2017-07-31 DIAGNOSIS — E1129 Type 2 diabetes mellitus with other diabetic kidney complication: Secondary | ICD-10-CM | POA: Diagnosis not present

## 2017-07-31 DIAGNOSIS — N186 End stage renal disease: Secondary | ICD-10-CM | POA: Diagnosis not present

## 2017-07-31 DIAGNOSIS — N2581 Secondary hyperparathyroidism of renal origin: Secondary | ICD-10-CM | POA: Diagnosis not present

## 2017-08-02 ENCOUNTER — Other Ambulatory Visit: Payer: Self-pay

## 2017-08-02 ENCOUNTER — Ambulatory Visit (HOSPITAL_COMMUNITY): Payer: Medicare Other | Attending: Cardiovascular Disease

## 2017-08-02 DIAGNOSIS — I081 Rheumatic disorders of both mitral and tricuspid valves: Secondary | ICD-10-CM | POA: Insufficient documentation

## 2017-08-02 DIAGNOSIS — I5030 Unspecified diastolic (congestive) heart failure: Secondary | ICD-10-CM | POA: Insufficient documentation

## 2017-08-02 DIAGNOSIS — I420 Dilated cardiomyopathy: Secondary | ICD-10-CM | POA: Insufficient documentation

## 2017-08-02 DIAGNOSIS — I4891 Unspecified atrial fibrillation: Secondary | ICD-10-CM

## 2017-08-03 DIAGNOSIS — N186 End stage renal disease: Secondary | ICD-10-CM | POA: Diagnosis not present

## 2017-08-03 DIAGNOSIS — D509 Iron deficiency anemia, unspecified: Secondary | ICD-10-CM | POA: Diagnosis not present

## 2017-08-03 DIAGNOSIS — E1129 Type 2 diabetes mellitus with other diabetic kidney complication: Secondary | ICD-10-CM | POA: Diagnosis not present

## 2017-08-03 DIAGNOSIS — N2581 Secondary hyperparathyroidism of renal origin: Secondary | ICD-10-CM | POA: Diagnosis not present

## 2017-08-05 DIAGNOSIS — E1129 Type 2 diabetes mellitus with other diabetic kidney complication: Secondary | ICD-10-CM | POA: Diagnosis not present

## 2017-08-05 DIAGNOSIS — N2581 Secondary hyperparathyroidism of renal origin: Secondary | ICD-10-CM | POA: Diagnosis not present

## 2017-08-05 DIAGNOSIS — D509 Iron deficiency anemia, unspecified: Secondary | ICD-10-CM | POA: Diagnosis not present

## 2017-08-05 DIAGNOSIS — N186 End stage renal disease: Secondary | ICD-10-CM | POA: Diagnosis not present

## 2017-08-06 ENCOUNTER — Ambulatory Visit (INDEPENDENT_AMBULATORY_CARE_PROVIDER_SITE_OTHER): Payer: Medicare Other | Admitting: Pharmacist Clinician (PhC)/ Clinical Pharmacy Specialist

## 2017-08-06 DIAGNOSIS — I4891 Unspecified atrial fibrillation: Secondary | ICD-10-CM

## 2017-08-06 DIAGNOSIS — Z7901 Long term (current) use of anticoagulants: Secondary | ICD-10-CM | POA: Diagnosis not present

## 2017-08-06 LAB — POCT INR: INR: 2.5

## 2017-08-06 NOTE — Patient Instructions (Signed)
Description   Take 1 tablet every day. Repeat INR in 1 week.

## 2017-08-07 DIAGNOSIS — D509 Iron deficiency anemia, unspecified: Secondary | ICD-10-CM | POA: Diagnosis not present

## 2017-08-07 DIAGNOSIS — N2581 Secondary hyperparathyroidism of renal origin: Secondary | ICD-10-CM | POA: Diagnosis not present

## 2017-08-07 DIAGNOSIS — N186 End stage renal disease: Secondary | ICD-10-CM | POA: Diagnosis not present

## 2017-08-07 DIAGNOSIS — E1129 Type 2 diabetes mellitus with other diabetic kidney complication: Secondary | ICD-10-CM | POA: Diagnosis not present

## 2017-08-08 ENCOUNTER — Encounter (HOSPITAL_COMMUNITY): Payer: Self-pay | Admitting: Emergency Medicine

## 2017-08-08 ENCOUNTER — Other Ambulatory Visit: Payer: Self-pay

## 2017-08-08 DIAGNOSIS — Y999 Unspecified external cause status: Secondary | ICD-10-CM | POA: Diagnosis not present

## 2017-08-08 DIAGNOSIS — Z794 Long term (current) use of insulin: Secondary | ICD-10-CM | POA: Diagnosis not present

## 2017-08-08 DIAGNOSIS — Y939 Activity, unspecified: Secondary | ICD-10-CM | POA: Insufficient documentation

## 2017-08-08 DIAGNOSIS — Y33XXXA Other specified events, undetermined intent, initial encounter: Secondary | ICD-10-CM | POA: Diagnosis not present

## 2017-08-08 DIAGNOSIS — N186 End stage renal disease: Secondary | ICD-10-CM | POA: Diagnosis not present

## 2017-08-08 DIAGNOSIS — S91201A Unspecified open wound of right great toe with damage to nail, initial encounter: Secondary | ICD-10-CM | POA: Diagnosis not present

## 2017-08-08 DIAGNOSIS — Z992 Dependence on renal dialysis: Secondary | ICD-10-CM | POA: Diagnosis not present

## 2017-08-08 DIAGNOSIS — I120 Hypertensive chronic kidney disease with stage 5 chronic kidney disease or end stage renal disease: Secondary | ICD-10-CM | POA: Diagnosis not present

## 2017-08-08 DIAGNOSIS — Z79899 Other long term (current) drug therapy: Secondary | ICD-10-CM | POA: Diagnosis not present

## 2017-08-08 DIAGNOSIS — E1122 Type 2 diabetes mellitus with diabetic chronic kidney disease: Secondary | ICD-10-CM | POA: Insufficient documentation

## 2017-08-08 DIAGNOSIS — I739 Peripheral vascular disease, unspecified: Secondary | ICD-10-CM | POA: Diagnosis not present

## 2017-08-08 DIAGNOSIS — Y929 Unspecified place or not applicable: Secondary | ICD-10-CM | POA: Insufficient documentation

## 2017-08-08 DIAGNOSIS — S99921A Unspecified injury of right foot, initial encounter: Secondary | ICD-10-CM | POA: Diagnosis present

## 2017-08-08 DIAGNOSIS — Z7902 Long term (current) use of antithrombotics/antiplatelets: Secondary | ICD-10-CM | POA: Insufficient documentation

## 2017-08-08 NOTE — ED Triage Notes (Signed)
Pt states that the toe nail of her right great toe came off tonight. Denies injury. Hx blisters on foot, pt is diabetic, does dialysis Tues/Thurs/Sat. Hx left BKA. Pt seen at [facility].

## 2017-08-09 ENCOUNTER — Telehealth: Payer: Self-pay | Admitting: Podiatry

## 2017-08-09 ENCOUNTER — Emergency Department (HOSPITAL_COMMUNITY)
Admission: EM | Admit: 2017-08-09 | Discharge: 2017-08-09 | Disposition: A | Discharge status: Home / Self Care | Payer: Medicare Other | Attending: Emergency Medicine | Admitting: Emergency Medicine

## 2017-08-09 DIAGNOSIS — S91201A Unspecified open wound of right great toe with damage to nail, initial encounter: Secondary | ICD-10-CM | POA: Diagnosis not present

## 2017-08-09 DIAGNOSIS — S91209A Unspecified open wound of unspecified toe(s) with damage to nail, initial encounter: Secondary | ICD-10-CM

## 2017-08-09 DIAGNOSIS — I739 Peripheral vascular disease, unspecified: Secondary | ICD-10-CM

## 2017-08-09 LAB — BASIC METABOLIC PANEL
Anion gap: 17 — ABNORMAL HIGH (ref 5–15)
BUN: 32 mg/dL — AB (ref 6–20)
CHLORIDE: 94 mmol/L — AB (ref 101–111)
CO2: 25 mmol/L (ref 22–32)
CREATININE: 6.41 mg/dL — AB (ref 0.44–1.00)
Calcium: 9.1 mg/dL (ref 8.9–10.3)
GFR calc Af Amer: 6 mL/min — ABNORMAL LOW (ref >60)
GFR calc non Af Amer: 5 mL/min — ABNORMAL LOW (ref >60)
Glucose, Bld: 101 mg/dL — ABNORMAL HIGH (ref 65–99)
Potassium: 4.1 mmol/L (ref 3.5–5.1)
Sodium: 136 mmol/L (ref 135–145)

## 2017-08-09 LAB — URINALYSIS, ROUTINE W REFLEX MICROSCOPIC
Bilirubin Urine: NEGATIVE
GLUCOSE, UA: NEGATIVE mg/dL
Hgb urine dipstick: NEGATIVE
Ketones, ur: NEGATIVE mg/dL
Nitrite: NEGATIVE
PROTEIN: 100 mg/dL — AB
Specific Gravity, Urine: 1.016 (ref 1.005–1.030)
pH: 6 (ref 5.0–8.0)

## 2017-08-09 LAB — CBC
HCT: 38.6 % (ref 36.0–46.0)
Hemoglobin: 11.8 g/dL — ABNORMAL LOW (ref 12.0–15.0)
MCH: 28.9 pg (ref 26.0–34.0)
MCHC: 30.6 g/dL (ref 30.0–36.0)
MCV: 94.4 fL (ref 78.0–100.0)
PLATELETS: 211 10*3/uL (ref 150–400)
RBC: 4.09 MIL/uL (ref 3.87–5.11)
RDW: 14.5 % (ref 11.5–15.5)
WBC: 5.6 10*3/uL (ref 4.0–10.5)

## 2017-08-09 MED ORDER — DOXYCYCLINE HYCLATE 100 MG PO CAPS: 100.0000 mg | ORAL_CAPSULE | Freq: Two times a day (BID) | ORAL | 0 refills | Status: DC

## 2017-08-09 MED ORDER — DOXYCYCLINE HYCLATE 100 MG PO TABS
100.0000 mg | ORAL_TABLET | Freq: Once | ORAL | Status: AC
  Administered 2017-08-09: 100 mg via ORAL
  Filled 2017-08-09: qty 1

## 2017-08-09 MED ORDER — CEPHALEXIN 500 MG PO CAPS: 500.0000 mg | ORAL_CAPSULE | Freq: Every day | ORAL | 0 refills | Status: DC

## 2017-08-09 MED ORDER — CEPHALEXIN 250 MG PO CAPS
500.0000 mg | ORAL_CAPSULE | Freq: Once | ORAL | Status: AC
  Administered 2017-08-09: 500 mg via ORAL
  Filled 2017-08-09: qty 2

## 2017-08-09 NOTE — Telephone Encounter (Signed)
Would like to see her before then or another physician can see before as well.

## 2017-08-09 NOTE — ED Notes (Addendum)
Right great toe wrapped with stretched gauze /splinted with a piece of tongue depressor . Plan of care explained by EDP to pt. and family.

## 2017-08-09 NOTE — Telephone Encounter (Signed)
Pt is scheduled with Dr. Samuella CotaPrice on Feb 1, she was unable to come in this week for follow up due to other pending appts. Is it ok for her to wait until the 1st or should I call her back to work something else out.

## 2017-08-09 NOTE — Discharge Instructions (Signed)
We saw you in the emergency room for partially avulsed toenail.  Given your history of severe diabetes, severe vascular disease -we recommend that you follow-up with the podiatrist and the vascular doctors for further evaluation.  Please do not remove the dressing that we have applied.  When taking showers please keep the dressing dry by covering it up. If your nail completely comes off, come to the emergency room right away.  Prescribing antibiotics to prevent infection.

## 2017-08-09 NOTE — ED Provider Notes (Addendum)
[facility] EMERGENCY DEPARTMENT Provider Note   CSN: [id number] Arrival date & time: 08/08/17  2257     History   Chief Complaint Chief Complaint  Patient presents with  . Toe Injury    HPI Jackie [name] is a 83 y.o. female.  HPI 83 year old comes in with chief complaint of toe injury.  Patient has history of diabetes, end-stage renal disease on hemodialysis, peripheral vascular arterial disease, status post left-sided BKA.  Patient reports that this evening she noted that her toenail on the right foot was hanging.  Patient is no idea how she ended up with the hanging nail.  Patient denies any associated pain, she also does not recall any trauma.  Patient sees a podiatrist for her nail care.  Past Medical History:  Diagnosis Date  . Anemia   . Arthritis   . Diabetes mellitus   . End-stage renal disease on hemodialysis (HCC)    dialysis T/Th/Sa  . Hyperlipidemia   . Hypertension   . Pneumonia   . Thyroid disease    pt denies this    Patient Active Problem List   Diagnosis Date Noted  . Long term (current) use of anticoagulants [Z79.01] 07/30/2017  . Atrial fibrillation (HCC) 07/23/2017  . Medication management 07/23/2017  . Essential hypertension 07/23/2017  . Mixed hyperlipidemia 07/23/2017  . PAD (peripheral artery disease) (HCC) 07/23/2017  . S/P unilateral BKA (below knee amputation) (HCC) 04/24/2015  . 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 Leukocytosis, unspecified 04/22/2013  . HTN (hypertension), benign 04/18/2013  . Diabetes mellitus (HCC) 04/18/2013  . Syncope 04/18/2013  . Anemia 04/18/2013  . End stage renal disease (HCC) 09/02/2011    Past Surgical History:  Procedure Laterality Date  . AMPUTATION Left 04/24/2015   Procedure: LEFT BELOW KNEE AMPUTATION;  Surgeon: Nadara Mustard, MD;  Location: [facility] OR;  Service: Orthopedics;  Laterality: Left;  . ARTERIOVENOUS GRAFT PLACEMENT Left    non function  . AV FISTULA  PLACEMENT Right 01/30/2013   Procedure: ARTERIOVENOUS (AV) FISTULA CREATION;  Surgeon: Fransisco Hertz, MD;  Location: [facility] OR;  Service: Vascular;  Laterality: Right;  . CESAREAN SECTION    . EYE SURGERY Bilateral    catarct  . FISTULA SUPERFICIALIZATION Right 04/24/2013   Procedure: FISTULA SUPERFICIALIZATION;  Surgeon: Larina Earthly, MD;  Location: [facility] OR;  Service: Vascular;  Laterality: Right;  . FISTULOGRAM Right 07/26/2013   Procedure: FISTULOGRAM;  Surgeon: Larina Earthly, MD;  Location: [facility] CATH LAB;  Service: Cardiovascular;  Laterality: Right;  . INSERTION OF DIALYSIS CATHETER Right 04/24/2013   Procedure: INSERTION OF DIALYSIS CATHETER;  Surgeon: Larina Earthly, MD;  Location: [facility] OR;  Service: Vascular;  Laterality: Right;  . PERIPHERAL VASCULAR CATHETERIZATION N/A 12/05/2014   Procedure: Abdominal Aortogram;  Surgeon: Fransisco Hertz, MD;  Location: [facility] INVASIVE CV LAB;  Service: Cardiovascular;  Laterality: N/A;  . PERIPHERAL VASCULAR CATHETERIZATION N/A 03/19/2015   Procedure: Lower Extremity Angiography;  Surgeon: Nada Libman, MD;  Location: [facility] INVASIVE CV LAB;  Service: Cardiovascular;  Laterality: N/A;  . PERIPHERAL VASCULAR CATHETERIZATION  03/19/2015   Procedure: Peripheral Vascular Intervention;  Surgeon: Nada Libman, MD;  Location: [facility] INVASIVE CV LAB;  Service: Cardiovascular;;  left sfa and popliteal stent  . TONSILLECTOMY      OB History    No data available       Home Medications    Prior to Admission medications   Medication Sig  Start Date End Date Taking? Authorizing Provider  albuterol (PROVENTIL HFA;VENTOLIN HFA) 108 (90 BASE) MCG/ACT inhaler Inhale 1-2 puffs into the lungs every 6 (six) hours as needed for wheezing. 12/27/12   Palumbo, April, MD  calcium acetate (PHOSLO) 667 MG capsule Take 2 capsules by mouth 3 (three) times daily with meals. 12/28/14   [provider]  cefTAZidime 2 g in dextrose 5 % 50 mL Inject 2 g into the vein Every Tuesday,Thursday,and  Saturday with dialysis. 12/08/14   Osvaldo Shipper, MD  cephALEXin (KEFLEX) 500 MG capsule Take 1 capsule (500 mg total) by mouth daily. On the day of your dialysis please take the Keflex after the dialysis session 08/09/17   Derwood Kaplan, MD  cinacalcet (SENSIPAR) 30 MG tablet Take 30 mg by mouth daily.    [provider]  clopidogrel (PLAVIX) 75 MG tablet TAKE ONE TABLET   BY MOUTH   DAILY 02/15/17   Chuck Hint, MD  docusate sodium (COLACE) 100 MG capsule Reported on 09/09/2015    [provider]  doxycycline (VIBRAMYCIN) 100 MG capsule Take 1 capsule (100 mg total) by mouth 2 (two) times daily. 08/09/17   Derwood Kaplan, MD  FOSRENOL 1000 MG chewable tablet Chew 2,000 mg by mouth 3 (three) times daily with meals.  01/18/13   [provider]  insulin detemir (LEVEMIR) 100 UNIT/ML injection Inject 0.1 mLs (10 Units total) into the skin at bedtime. Patient taking differently: Inject 0.66ml (10 units) into skin at bedtime to control blood sugar 12/07/14   Osvaldo Shipper, MD  oxyCODONE-acetaminophen (ROXICET) 5-325 MG tablet Take 1 tablet by mouth every 4 (four) hours as needed for severe pain. 04/25/15   Nadara Mustard, MD  promethazine (PHENERGAN) 12.5 MG tablet Take 1 tablet by mouth daily as needed. 07/22/17   [provider]  Protein (PROCEL PO) Two scoops twice daily    [provider]  saccharomyces boulardii (FLORASTOR) 250 MG capsule Take 1 capsule (250 mg total) by mouth 2 (two) times daily. 12/07/14   Osvaldo Shipper, MD  warfarin (COUMADIN) 5 MG tablet Take 1 tablet (5 mg total) by mouth daily. 07/23/17   Hilty, Lisette Abu, MD    Family History Family History  Problem Relation Age of Onset  . Diabetes Mother   . Heart disease Mother   . Hypertension Mother     Social History Social History   Tobacco Use  . Smoking status: Never Smoker  . Smokeless tobacco: Never Used  Substance Use Topics  . Alcohol use: No  . Drug use: No      Allergies   Patient has no known allergies.   Review of Systems Review of Systems  Constitutional: Negative for activity change.  Skin: Positive for wound.  Allergic/Immunologic: Positive for immunocompromised state.  Hematological: Bruises/bleeds easily.     Physical Exam Updated Vital Signs BP (!) 163/83 (BP Location: Left Arm)   Pulse 92   Temp 98.2 F (36.8 C) (Oral)   Resp 16   Ht 5\' 9"  (1.753 m)   Wt 61.2 kg (135 lb)   SpO2 99%   BMI 19.94 kg/m   Physical Exam  Constitutional: She is oriented to person, place, and time. She appears well-developed.  HENT:  Head: Normocephalic and atraumatic.  Eyes: EOM are normal.  Neck: Normal range of motion. Neck supple.  Cardiovascular: Normal rate.  Pulmonary/Chest: Effort normal.  Abdominal: Bowel sounds are normal.  Musculoskeletal:  Right toe is partially avulsed, with about  50% of it still attached to the matrix of the nail bed.  Neurological: She is alert and oriented to person, place, and time.  Skin: Skin is warm and dry.  Hyperpigmented skin of the distal right foot. Skin is warm to touch. Patient has a dopplerable dorsalis pedis pulse   Nursing note and vitals reviewed.            ED Treatments / Results  Labs (all labs ordered are listed, but only abnormal results are displayed) Labs Reviewed  BASIC METABOLIC PANEL - Abnormal; Notable for the following components:      Result Value   Chloride 94 (*)    Glucose, Bld 101 (*)    BUN 32 (*)    Creatinine, Ser 6.41 (*)    GFR calc non Af Amer 5 (*)    GFR calc Af Amer 6 (*)    Anion gap 17 (*)    All other components within normal limits  CBC - Abnormal; Notable for the following components:   Hemoglobin 11.8 (*)    All other components within normal limits  URINALYSIS, ROUTINE W REFLEX MICROSCOPIC - Abnormal; Notable for the following components:   APPearance TURBID (*)    Protein, ur 100 (*)    Leukocytes, UA MODERATE (*)    Bacteria,  UA MANY (*)    Squamous Epithelial / LPF TOO NUMEROUS TO COUNT (*)    All other components within normal limits    EKG  EKG Interpretation None       Radiology No results found.  Procedures .Splint Application Date/Time: 08/09/2017 4:40 AM Performed by: Derwood Kaplan, MD Authorized by: Derwood Kaplan, MD   Consent:    Consent obtained:  Verbal   Consent given by:  Patient   Risks discussed:  Discoloration and pain   Alternatives discussed:  No treatment Pre-procedure details:    Sensation:  Normal   Skin color:  Hyperpigmented Procedure details:    Laterality:  Right   Location:  Foot   Foot:  R foot   Strapping: yes   Post-procedure details:    Patient tolerance of procedure:  Tolerated well, no immediate complications Comments:     Right great toe was splinted, to prevent complete avulsion of the nail.  A toe/finger splint was placed. A splint was placed over the toe to prevent the avulsed nail from dislodging completely.   (including critical care time)  Medications Ordered in ED Medications  cephALEXin (KEFLEX) capsule 500 mg (500 mg Oral Given 08/09/17 0435)  doxycycline (VIBRA-TABS) tablet 100 mg (100 mg Oral Given 08/09/17 0435)     Initial Impression / Assessment and Plan / ED Course  I have reviewed the triage vital signs and the nursing notes.  Pertinent labs & imaging results that were available during my care of the patient were reviewed by me and considered in my medical decision making (see chart for details).     Patient with complex medical history comes in with partially avulsed right toenail.  Patient is on anticoagulation, and she has peripheral vascular arterial disease.  It is also noted that patient has hyperpigmented skin over the right foot.  I suspect that the avulsed toenail is a result of worsening of her peripheral vascular arterial disease.  The hyperpigmentation of the skin could also be result of poor perfusion of the  tissue.  Given that the toe nail is still partially in place, and there is no emergency, we will advised that patient follow-up  with her podiatrist as soon as possible for optimal care.  Given the concerns for worsening peripheral vascular arterial disease, I will also advised that patient follow-up with vascular surgery.  ER return precautions have been discussed with the patient.  To prevent infection we will start patient on Keflex and doxycycline.  I spoke with the pharmacist Tammy SoursGreg, who recommends doxycycline twice daily and Keflex 500 mg daily.  Final Clinical Impressions(s) / ED Diagnoses   Final diagnoses:  Avulsion of toenail, initial encounter  Peripheral vascular disease [facility](HCC)    ED Discharge Orders        Ordered    cephALEXin (KEFLEX) 500 MG capsule  Daily     08/09/17 0432    doxycycline (VIBRAMYCIN) 100 MG capsule  2 times daily     08/09/17 0432       Derwood KaplanNanavati, Inari Shin, MD 08/09/17 [id number]    Derwood KaplanNanavati, Ignazio Kincaid, MD 08/23/17 2250    Derwood KaplanNanavati, Lizmary Nader, MD 08/23/17 2251

## 2017-08-10 DIAGNOSIS — D509 Iron deficiency anemia, unspecified: Secondary | ICD-10-CM | POA: Diagnosis not present

## 2017-08-10 DIAGNOSIS — E1129 Type 2 diabetes mellitus with other diabetic kidney complication: Secondary | ICD-10-CM | POA: Diagnosis not present

## 2017-08-10 DIAGNOSIS — N186 End stage renal disease: Secondary | ICD-10-CM | POA: Diagnosis not present

## 2017-08-10 DIAGNOSIS — N2581 Secondary hyperparathyroidism of renal origin: Secondary | ICD-10-CM | POA: Diagnosis not present

## 2017-08-11 ENCOUNTER — Telehealth: Payer: Self-pay | Admitting: Surgery

## 2017-08-11 ENCOUNTER — Telehealth: Payer: Self-pay | Admitting: Podiatry

## 2017-08-11 NOTE — Telephone Encounter (Signed)
Spoke with pt. Again and she was still unable to come in before 2/1, due to other appts.

## 2017-08-11 NOTE — Telephone Encounter (Signed)
-----   Message from Nada LibmanVance W Brabham, MD sent at 08/10/2017  9:26 PM EST ----- Regarding: RE: Brabham appt  My appointment sounds too far out for her leg.  Can Dr. Edilia Boickson evaluate it at the time of his appointment and make arrangements? ----- Message ----- From: Jena Gaussoczniak, Michele A Sent: 08/10/2017  11:37 AM To: Nada LibmanVance W Brabham, MD Subject: Annell GreeningFWMyra Gianotti: Brabham appt                                 ----- Message ----- From: Sharee PimpleMcChesney, Marilyn K, RN Sent: 08/10/2017  10:58 AM To: Jena GaussMichele A Roczniak Subject: RE: Brabham appt                               Send this to Brabham to decide.  ----- Message ----- From: Jena Gaussoczniak, Michele A Sent: 08/10/2017  10:54 AM To: Sharee PimpleMarilyn K McChesney, RN Subject: RE: Myra GianottiBrabham appt                               This pt has an appt 09/01/17 w/ [facility] for eval for new access and VWB's next available is not until 09/06/17.  Should I just add this to the 09/01/17 appt or keep it separate with VWB?  And if it should be with VWB, is 09/06/17 too far out?  ----- Message ----- From: Sharee PimpleMcChesney, Marilyn K, RN Sent: 08/10/2017   9:06 AM To: [facility] Pool Subject: Myra GianottiBrabham appt                                     ----- Message ----- From: Nada LibmanBrabham, Vance W, MD Sent: 08/09/2017   5:47 PM To: [facility] Charge Pool  Patient needs visit for limb slavage.  Estab patient of mine.

## 2017-08-12 DIAGNOSIS — N2581 Secondary hyperparathyroidism of renal origin: Secondary | ICD-10-CM | POA: Diagnosis not present

## 2017-08-12 DIAGNOSIS — D509 Iron deficiency anemia, unspecified: Secondary | ICD-10-CM | POA: Diagnosis not present

## 2017-08-12 DIAGNOSIS — N186 End stage renal disease: Secondary | ICD-10-CM | POA: Diagnosis not present

## 2017-08-12 DIAGNOSIS — E1129 Type 2 diabetes mellitus with other diabetic kidney complication: Secondary | ICD-10-CM | POA: Diagnosis not present

## 2017-08-13 ENCOUNTER — Ambulatory Visit (INDEPENDENT_AMBULATORY_CARE_PROVIDER_SITE_OTHER): Payer: Medicare Other | Admitting: Pharmacist

## 2017-08-13 DIAGNOSIS — I4891 Unspecified atrial fibrillation: Secondary | ICD-10-CM

## 2017-08-13 DIAGNOSIS — Z7901 Long term (current) use of anticoagulants: Secondary | ICD-10-CM | POA: Diagnosis not present

## 2017-08-13 LAB — POCT INR: INR: 1.6

## 2017-08-14 DIAGNOSIS — E1129 Type 2 diabetes mellitus with other diabetic kidney complication: Secondary | ICD-10-CM | POA: Diagnosis not present

## 2017-08-14 DIAGNOSIS — D509 Iron deficiency anemia, unspecified: Secondary | ICD-10-CM | POA: Diagnosis not present

## 2017-08-14 DIAGNOSIS — N186 End stage renal disease: Secondary | ICD-10-CM | POA: Diagnosis not present

## 2017-08-14 DIAGNOSIS — N2581 Secondary hyperparathyroidism of renal origin: Secondary | ICD-10-CM | POA: Diagnosis not present

## 2017-08-16 ENCOUNTER — Ambulatory Visit: Payer: Medicare Other | Admitting: Podiatry

## 2017-08-16 ENCOUNTER — Encounter (INDEPENDENT_AMBULATORY_CARE_PROVIDER_SITE_OTHER): Payer: Self-pay | Admitting: Orthopedic Surgery

## 2017-08-16 ENCOUNTER — Ambulatory Visit (INDEPENDENT_AMBULATORY_CARE_PROVIDER_SITE_OTHER): Payer: Medicare Other | Admitting: Orthopedic Surgery

## 2017-08-16 VITALS — Ht 69.0 in | Wt 135.0 lb

## 2017-08-16 DIAGNOSIS — Z89519 Acquired absence of unspecified leg below knee: Secondary | ICD-10-CM

## 2017-08-16 DIAGNOSIS — N184 Chronic kidney disease, stage 4 (severe): Secondary | ICD-10-CM

## 2017-08-16 DIAGNOSIS — E1122 Type 2 diabetes mellitus with diabetic chronic kidney disease: Secondary | ICD-10-CM

## 2017-08-16 NOTE — Progress Notes (Signed)
Office Visit Note   Patient: Jackie [name]           Date of Birth: 10/18/1934           MRN: [id number] Visit Date: 08/16/2017              Requested by: Mirna MiresHill, Gerald, MD [address] [place] [id number] PCP: Mirna MiresHill, Gerald, MD  Chief Complaint  Patient presents with  . Left Leg - Follow-up    Left BKA      HPI: The patient is an 83 year old woman seen today in follow for blistered ulceration to L BKA. Has been out of prosthesis except for transfers.  Assessment & Plan: Visit Diagnoses:  1. S/P unilateral BKA (below knee amputation) (HCC)   2. Type 2 diabetes mellitus with stage 4 chronic kidney disease, unspecified whether long term insulin use (HCC)     Plan: May ease back into prosthesis. Follow up in office as needed.  Follow-Up Instructions: Return if symptoms worsen or fail to improve.   Ortho Exam  Patient is alert, oriented, no adenopathy, well-dressed, normal affect, normal respiratory effort. On exam of the left below the knee amputation The large blistered ulceration has healed well. Debrided of nonviable tissue. No underlying open ulcers. there is no bleeding no erythema no tenderness no sign of infection  Imaging: No results found. No images are attached to the encounter.  Labs: Lab Results  Component Value Date   HGBA1C 6.3 (H) 12/04/2014   HGBA1C (H) 05/22/2010    6.4 (NOTE)                                                                       According to the ADA Clinical Practice Recommendations for 2011, when HbA1c is used as a screening test:   >=6.5%   Diagnostic of Diabetes Mellitus           (if abnormal result  is confirmed)  5.7-6.4%   Increased risk of developing Diabetes Mellitus  References:Diagnosis and Classification of Diabetes Mellitus,Diabetes Care,2011,34(Suppl 1):S62-S69 and Standards of Medical Care in         Diabetes - 2011,Diabetes Care,2011,34  (Suppl 1):S11-S61.   HGBA1C (H) 11/16/2009    7.3 (NOTE)                                                                        According to the ADA Clinical Practice Recommendations for 2011, when HbA1c is used as a screening test:   >=6.5%   Diagnostic of Diabetes Mellitus           (if abnormal result  is confirmed)  5.7-6.4%   Increased risk of developing Diabetes Mellitus  References:Diagnosis and Classification of Diabetes Mellitus,Diabetes Care,2011,34(Suppl 1):S62-S69 and Standards of Medical Care in         Diabetes - 2011,Diabetes Care,2011,34  (Suppl 1):S11-S61.   REPTSTATUS 12/08/2014 FINAL 12/04/2014   GRAMSTAIN  12/04/2014  NO WBC SEEN NO SQUAMOUS EPITHELIAL CELLS SEEN FEW GRAM POSITIVE COCCI IN PAIRS Performed at [facility]    CULT  12/04/2014    MULTIPLE ORGANISMS PRESENT, NONE PREDOMINANT Note: NO STAPHYLOCOCCUS AUREUS ISOLATED NO GROUP A STREP (S.PYOGENES) ISOLATED Performed at [facility]    LABORGA ESCHERICHIA COLI 01/08/2013    @LABSALLVALUES (HGBA1)@  Body mass index is 19.94 kg/m.  Orders:  No orders of the defined types were placed in this encounter.  No orders of the defined types were placed in this encounter.    Procedures: No procedures performed  Clinical Data: No additional findings.  ROS:  All other systems negative, except as noted in the HPI. Review of Systems  Constitutional: Negative for chills and fever.  Cardiovascular: Negative for leg swelling.  Skin: Positive for wound. Negative for color change and rash.    Objective: Vital Signs: Ht 5\' 9"  (1.753 m)   Wt 135 lb (61.2 kg)   BMI 19.94 kg/m   Specialty Comments:  No specialty comments available.  PMFS History: Patient Active Problem List   Diagnosis Date Noted  . Long term (current) use of anticoagulants [Z79.01] 07/30/2017  . Atrial fibrillation (HCC) 07/23/2017  . Medication management 07/23/2017  . Essential hypertension 07/23/2017  . Mixed hyperlipidemia 07/23/2017  . PAD (peripheral artery disease) (HCC)  07/23/2017  . S/P unilateral BKA (below knee amputation) (HCC) 04/24/2015  . 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 Leukocytosis, unspecified 04/22/2013  . HTN (hypertension), benign 04/18/2013  . Diabetes mellitus (HCC) 04/18/2013  . Syncope 04/18/2013  . Anemia 04/18/2013  . End stage renal disease (HCC) 09/02/2011   Past Medical History:  Diagnosis Date  . Anemia   . Arthritis   . Diabetes mellitus   . End-stage renal disease on hemodialysis (HCC)    dialysis T/Th/Sa  . Hyperlipidemia   . Hypertension   . Pneumonia   . Thyroid disease    pt denies this    Family History  Problem Relation Age of Onset  . Diabetes Mother   . Heart disease Mother   . Hypertension Mother     Past Surgical History:  Procedure Laterality Date  . AMPUTATION Left 04/24/2015   Procedure: LEFT BELOW KNEE AMPUTATION;  Surgeon: Nadara Mustard, MD;  Location: [facility] OR;  Service: Orthopedics;  Laterality: Left;  . ARTERIOVENOUS GRAFT PLACEMENT Left    non function  . AV FISTULA PLACEMENT Right 01/30/2013   Procedure: ARTERIOVENOUS (AV) FISTULA CREATION;  Surgeon: Fransisco Hertz, MD;  Location: [facility] OR;  Service: Vascular;  Laterality: Right;  . CESAREAN SECTION    . EYE SURGERY Bilateral    catarct  . FISTULA SUPERFICIALIZATION Right 04/24/2013   Procedure: FISTULA SUPERFICIALIZATION;  Surgeon: Larina Earthly, MD;  Location: [facility] OR;  Service: Vascular;  Laterality: Right;  . FISTULOGRAM Right 07/26/2013   Procedure: FISTULOGRAM;  Surgeon: Larina Earthly, MD;  Location: [facility] CATH LAB;  Service: Cardiovascular;  Laterality: Right;  . INSERTION OF DIALYSIS CATHETER Right 04/24/2013   Procedure: INSERTION OF DIALYSIS CATHETER;  Surgeon: Larina Earthly, MD;  Location: [facility] OR;  Service: Vascular;  Laterality: Right;  . PERIPHERAL VASCULAR CATHETERIZATION N/A 12/05/2014   Procedure: Abdominal Aortogram;  Surgeon: Fransisco Hertz, MD;  Location: [facility] INVASIVE CV LAB;  Service: Cardiovascular;  Laterality: N/A;  .  PERIPHERAL VASCULAR CATHETERIZATION N/A 03/19/2015   Procedure: Lower Extremity Angiography;  Surgeon: Nada Libman, MD;  Location: [facility] INVASIVE CV LAB;  Service: Cardiovascular;  Laterality: N/A;  . PERIPHERAL VASCULAR CATHETERIZATION  03/19/2015   Procedure: Peripheral Vascular Intervention;  Surgeon: Nada Libman, MD;  Location: [facility] INVASIVE CV LAB;  Service: Cardiovascular;;  left sfa and popliteal stent  . TONSILLECTOMY     Social History   Occupational History  . Not on file  Tobacco Use  . Smoking status: Never Smoker  . Smokeless tobacco: Never Used  Substance and Sexual Activity  . Alcohol use: No  . Drug use: No  . Sexual activity: Not on file

## 2017-08-17 ENCOUNTER — Ambulatory Visit: Payer: Medicare Other | Admitting: Family

## 2017-08-17 DIAGNOSIS — D509 Iron deficiency anemia, unspecified: Secondary | ICD-10-CM | POA: Diagnosis not present

## 2017-08-17 DIAGNOSIS — N186 End stage renal disease: Secondary | ICD-10-CM | POA: Diagnosis not present

## 2017-08-17 DIAGNOSIS — E1129 Type 2 diabetes mellitus with other diabetic kidney complication: Secondary | ICD-10-CM | POA: Diagnosis not present

## 2017-08-17 DIAGNOSIS — N2581 Secondary hyperparathyroidism of renal origin: Secondary | ICD-10-CM | POA: Diagnosis not present

## 2017-08-18 DIAGNOSIS — N186 End stage renal disease: Secondary | ICD-10-CM | POA: Diagnosis not present

## 2017-08-18 DIAGNOSIS — I4891 Unspecified atrial fibrillation: Secondary | ICD-10-CM | POA: Diagnosis not present

## 2017-08-18 DIAGNOSIS — Z7901 Long term (current) use of anticoagulants: Secondary | ICD-10-CM | POA: Diagnosis not present

## 2017-08-18 DIAGNOSIS — E1122 Type 2 diabetes mellitus with diabetic chronic kidney disease: Secondary | ICD-10-CM | POA: Diagnosis not present

## 2017-08-19 DIAGNOSIS — N2581 Secondary hyperparathyroidism of renal origin: Secondary | ICD-10-CM | POA: Diagnosis not present

## 2017-08-19 DIAGNOSIS — D509 Iron deficiency anemia, unspecified: Secondary | ICD-10-CM | POA: Diagnosis not present

## 2017-08-19 DIAGNOSIS — N186 End stage renal disease: Secondary | ICD-10-CM | POA: Diagnosis not present

## 2017-08-19 DIAGNOSIS — Z992 Dependence on renal dialysis: Secondary | ICD-10-CM | POA: Diagnosis not present

## 2017-08-19 DIAGNOSIS — E1129 Type 2 diabetes mellitus with other diabetic kidney complication: Secondary | ICD-10-CM | POA: Diagnosis not present

## 2017-08-20 ENCOUNTER — Ambulatory Visit (INDEPENDENT_AMBULATORY_CARE_PROVIDER_SITE_OTHER): Payer: Medicare Other | Admitting: Podiatry

## 2017-08-20 DIAGNOSIS — N186 End stage renal disease: Secondary | ICD-10-CM | POA: Diagnosis not present

## 2017-08-20 DIAGNOSIS — M79675 Pain in left toe(s): Secondary | ICD-10-CM

## 2017-08-20 DIAGNOSIS — M79674 Pain in right toe(s): Secondary | ICD-10-CM

## 2017-08-20 DIAGNOSIS — Z992 Dependence on renal dialysis: Secondary | ICD-10-CM | POA: Diagnosis not present

## 2017-08-20 DIAGNOSIS — B351 Tinea unguium: Secondary | ICD-10-CM

## 2017-08-20 DIAGNOSIS — E1129 Type 2 diabetes mellitus with other diabetic kidney complication: Secondary | ICD-10-CM | POA: Diagnosis not present

## 2017-08-20 MED ORDER — CEPHALEXIN 500 MG PO CAPS: 500.0000 mg | ORAL_CAPSULE | Freq: Every day | ORAL | 0 refills | Status: DC

## 2017-08-20 MED ORDER — DOXYCYCLINE HYCLATE 100 MG PO CAPS: 100.0000 mg | ORAL_CAPSULE | Freq: Two times a day (BID) | ORAL | 0 refills | Status: DC

## 2017-08-20 NOTE — Patient Instructions (Signed)
Place 1/4 cup of epsom salts in a quart of warm tap water.  Submerge your foot or feet in the solution and soak for 20 minutes.  This soak should be done twice a day.  Next, remove your foot or feet from solution, blot dry the affected area. Apply ointment and cover if instructed by your doctor.   IF YOUR SKIN BECOMES IRRITATED WHILE USING THESE INSTRUCTIONS, IT IS OKAY TO SWITCH TO  WHITE VINEGAR AND WATER.  As another alternative soak, you may use antibacterial soap and water.  Monitor for any signs/symptoms of infection. Call the office immediately if any occur or go directly to the emergency room. Call with any questions/concerns.  Betadine Soak Instructions  Purchase an 8 oz. bottle of BETADINE solution (Povidone)  THE DAY AFTER THE PROCEDURE  Place 1 tablespoon of betadine solution in a quart of warm tap water.  Submerge your foot or feet with outer bandage intact for the initial soak; this will allow the bandage to become moist and wet for easy lift off.  Once you remove your bandage, continue to soak in the solution for 20 minutes.  This soak should be done twice a day.  Next, remove your foot or feet from solution, blot dry the affected area and cover.  You may use a band aid large enough to cover the area or use gauze and tape.  Apply other medications to the area as directed by the doctor such as cortisporin otic solution (ear drops) or neosporin.  IF YOUR SKIN BECOMES IRRITATED WHILE USING THESE INSTRUCTIONS, IT IS OKAY TO SWITCH TO EPSOM SALTS AND WATER OR WHITE VINEGAR AND WATER.     Long Term Care Instructions-Post Nail Surgery  You have had your ingrown toenail and root treated with a chemical.  This chemical causes a burn that will drain and ooze like a blister.  This can drain for 6-8 weeks or longer.  It is important to keep this area clean, covered, and follow the soaking instructions dispensed at the time of your surgery.  This area will eventually dry and form a scab.   Once the scab forms you no longer need to soak or apply a dressing.  If at any time you experience an increase in pain, redness, swelling, or drainage, you should contact the office as soon as possible. 

## 2017-08-21 DIAGNOSIS — D631 Anemia in chronic kidney disease: Secondary | ICD-10-CM | POA: Diagnosis not present

## 2017-08-21 DIAGNOSIS — D509 Iron deficiency anemia, unspecified: Secondary | ICD-10-CM | POA: Diagnosis not present

## 2017-08-21 DIAGNOSIS — N186 End stage renal disease: Secondary | ICD-10-CM | POA: Diagnosis not present

## 2017-08-21 DIAGNOSIS — N2581 Secondary hyperparathyroidism of renal origin: Secondary | ICD-10-CM | POA: Diagnosis not present

## 2017-08-21 DIAGNOSIS — E1129 Type 2 diabetes mellitus with other diabetic kidney complication: Secondary | ICD-10-CM | POA: Diagnosis not present

## 2017-08-23 ENCOUNTER — Ambulatory Visit (INDEPENDENT_AMBULATORY_CARE_PROVIDER_SITE_OTHER): Payer: Medicare Other | Admitting: Pharmacist Clinician (PhC)/ Clinical Pharmacy Specialist

## 2017-08-23 DIAGNOSIS — I4891 Unspecified atrial fibrillation: Secondary | ICD-10-CM | POA: Diagnosis not present

## 2017-08-23 DIAGNOSIS — Z7901 Long term (current) use of anticoagulants: Secondary | ICD-10-CM

## 2017-08-23 LAB — POCT INR: INR: 1.4

## 2017-08-23 NOTE — Patient Instructions (Signed)
Description   Take another whole tablet today Monday Feb 4, then increase dose to 1 tablet daily except 1.5 tablets each Tuesday and Friday.  Repeat INR in 9 days

## 2017-08-24 DIAGNOSIS — E1129 Type 2 diabetes mellitus with other diabetic kidney complication: Secondary | ICD-10-CM | POA: Diagnosis not present

## 2017-08-24 DIAGNOSIS — D509 Iron deficiency anemia, unspecified: Secondary | ICD-10-CM | POA: Diagnosis not present

## 2017-08-24 DIAGNOSIS — N186 End stage renal disease: Secondary | ICD-10-CM | POA: Diagnosis not present

## 2017-08-24 DIAGNOSIS — D631 Anemia in chronic kidney disease: Secondary | ICD-10-CM | POA: Diagnosis not present

## 2017-08-24 DIAGNOSIS — N2581 Secondary hyperparathyroidism of renal origin: Secondary | ICD-10-CM | POA: Diagnosis not present

## 2017-08-26 DIAGNOSIS — E1129 Type 2 diabetes mellitus with other diabetic kidney complication: Secondary | ICD-10-CM | POA: Diagnosis not present

## 2017-08-26 DIAGNOSIS — N2581 Secondary hyperparathyroidism of renal origin: Secondary | ICD-10-CM | POA: Diagnosis not present

## 2017-08-26 DIAGNOSIS — D509 Iron deficiency anemia, unspecified: Secondary | ICD-10-CM | POA: Diagnosis not present

## 2017-08-26 DIAGNOSIS — N186 End stage renal disease: Secondary | ICD-10-CM | POA: Diagnosis not present

## 2017-08-26 DIAGNOSIS — D631 Anemia in chronic kidney disease: Secondary | ICD-10-CM | POA: Diagnosis not present

## 2017-08-28 DIAGNOSIS — D631 Anemia in chronic kidney disease: Secondary | ICD-10-CM | POA: Diagnosis not present

## 2017-08-28 DIAGNOSIS — E1129 Type 2 diabetes mellitus with other diabetic kidney complication: Secondary | ICD-10-CM | POA: Diagnosis not present

## 2017-08-28 DIAGNOSIS — D509 Iron deficiency anemia, unspecified: Secondary | ICD-10-CM | POA: Diagnosis not present

## 2017-08-28 DIAGNOSIS — N186 End stage renal disease: Secondary | ICD-10-CM | POA: Diagnosis not present

## 2017-08-28 DIAGNOSIS — N2581 Secondary hyperparathyroidism of renal origin: Secondary | ICD-10-CM | POA: Diagnosis not present

## 2017-08-31 DIAGNOSIS — N2581 Secondary hyperparathyroidism of renal origin: Secondary | ICD-10-CM | POA: Diagnosis not present

## 2017-08-31 DIAGNOSIS — D509 Iron deficiency anemia, unspecified: Secondary | ICD-10-CM | POA: Diagnosis not present

## 2017-08-31 DIAGNOSIS — N186 End stage renal disease: Secondary | ICD-10-CM | POA: Diagnosis not present

## 2017-08-31 DIAGNOSIS — E1129 Type 2 diabetes mellitus with other diabetic kidney complication: Secondary | ICD-10-CM | POA: Diagnosis not present

## 2017-08-31 DIAGNOSIS — D631 Anemia in chronic kidney disease: Secondary | ICD-10-CM | POA: Diagnosis not present

## 2017-09-01 ENCOUNTER — Ambulatory Visit (INDEPENDENT_AMBULATORY_CARE_PROVIDER_SITE_OTHER)
Admission: RE | Admit: 2017-09-01 | Discharge: 2017-09-01 | Disposition: A | Discharge status: Home / Self Care | Payer: Medicare Other | Source: Ambulatory Visit | Attending: Vascular Surgery | Admitting: Vascular Surgery

## 2017-09-01 ENCOUNTER — Ambulatory Visit (INDEPENDENT_AMBULATORY_CARE_PROVIDER_SITE_OTHER): Payer: Medicare Other | Admitting: Pharmacist

## 2017-09-01 ENCOUNTER — Encounter: Payer: Self-pay | Admitting: Internal Medicine

## 2017-09-01 ENCOUNTER — Other Ambulatory Visit: Payer: Self-pay | Admitting: *Deleted

## 2017-09-01 ENCOUNTER — Ambulatory Visit (HOSPITAL_COMMUNITY)
Admission: RE | Admit: 2017-09-01 | Discharge: 2017-09-01 | Disposition: A | Discharge status: Home / Self Care | Payer: Medicare Other | Source: Ambulatory Visit | Attending: Vascular Surgery | Admitting: Vascular Surgery

## 2017-09-01 ENCOUNTER — Encounter: Payer: Self-pay | Admitting: Vascular Surgery

## 2017-09-01 ENCOUNTER — Ambulatory Visit (INDEPENDENT_AMBULATORY_CARE_PROVIDER_SITE_OTHER): Payer: Medicare Other | Admitting: Internal Medicine

## 2017-09-01 ENCOUNTER — Ambulatory Visit (INDEPENDENT_AMBULATORY_CARE_PROVIDER_SITE_OTHER): Payer: Medicare Other | Admitting: Vascular Surgery

## 2017-09-01 ENCOUNTER — Encounter: Payer: Self-pay | Admitting: *Deleted

## 2017-09-01 VITALS — BP 140/80 | HR 85 | Ht 69.0 in | Wt 130.6 lb

## 2017-09-01 VITALS — BP 146/99 | HR 98 | Temp 97.0°F | Resp 18 | Ht 69.0 in | Wt 130.0 lb

## 2017-09-01 DIAGNOSIS — I10 Essential (primary) hypertension: Secondary | ICD-10-CM

## 2017-09-01 DIAGNOSIS — I4891 Unspecified atrial fibrillation: Secondary | ICD-10-CM

## 2017-09-01 DIAGNOSIS — N186 End stage renal disease: Secondary | ICD-10-CM | POA: Diagnosis not present

## 2017-09-01 DIAGNOSIS — N185 Chronic kidney disease, stage 5: Secondary | ICD-10-CM

## 2017-09-01 DIAGNOSIS — I4811 Longstanding persistent atrial fibrillation: Secondary | ICD-10-CM

## 2017-09-01 DIAGNOSIS — Z7901 Long term (current) use of anticoagulants: Secondary | ICD-10-CM

## 2017-09-01 DIAGNOSIS — I739 Peripheral vascular disease, unspecified: Secondary | ICD-10-CM

## 2017-09-01 DIAGNOSIS — Z992 Dependence on renal dialysis: Secondary | ICD-10-CM

## 2017-09-01 DIAGNOSIS — I481 Persistent atrial fibrillation: Secondary | ICD-10-CM

## 2017-09-01 DIAGNOSIS — Z01812 Encounter for preprocedural laboratory examination: Secondary | ICD-10-CM

## 2017-09-01 LAB — POCT INR: INR: 1.6

## 2017-09-01 NOTE — H&P (View-Only) (Signed)
Patient name: Jackie [name] MRN: [id number] DOB: 03-Sep-1934 Sex: female   REASON FOR CONSULT:    Evaluate for hemodialysis access.  The consult is requested by Dr. Paulene Floor.  HPI:   Jackie [name] is a pleasant 83 y.o. female, who presents for new hemodialysis access.  On 07/29/2017 she had presented with a thrombosed right brachiocephalic fistula.  This is undergone multiple interventions and ultimately this could not be salvaged.  The patient had a tunneled dialysis catheter placed at that time by [facility].  Patient is referred for new hemodialysis access.  She dialyzes on Tuesdays Thursdays and Saturdays.  She has a left IJ tunneled dialysis catheter which has been working well.  She is previously had a left upper arm graft placed.  She is right-handed and would prefer to have access in the left arm.  She denies any recent uremic symptoms.  Specifically she denies nausea, vomiting, fatigue, anorexia, or palpitations.  Past Medical History:  Diagnosis Date  . Anemia   . Arthritis   . Diabetes mellitus   . End-stage renal disease on hemodialysis (HCC)    dialysis T/Th/Sa  . Hyperlipidemia   . Hypertension   . Pneumonia   . Thyroid disease    pt denies this    Family History  Problem Relation Age of Onset  . Diabetes Mother   . Heart disease Mother   . Hypertension Mother     SOCIAL HISTORY: Social History   Socioeconomic History  . Marital status: Widowed    Spouse name: Not on file  . Number of children: Not on file  . Years of education: Not on file  . Highest education level: Not on file  Social Needs  . Financial resource strain: Not on file  . Food insecurity - worry: Not on file  . Food insecurity - inability: Not on file  . Transportation needs - medical: Not on file  . Transportation needs - non-medical: Not on file  Occupational History  . Not on file  Tobacco Use  . Smoking status: Never Smoker  . Smokeless tobacco: Never Used  Substance  and Sexual Activity  . Alcohol use: No  . Drug use: No  . Sexual activity: Not on file  Other Topics Concern  . Not on file  Social History Narrative  . Not on file    No Known Allergies  Current Outpatient Medications  Medication Sig Dispense Refill  . albuterol (PROVENTIL HFA;VENTOLIN HFA) 108 (90 BASE) MCG/ACT inhaler Inhale 1-2 puffs into the lungs every 6 (six) hours as needed for wheezing. 1 Inhaler 0  . calcium acetate (PHOSLO) 667 MG capsule Take 2 capsules by mouth 3 (three) times daily with meals.    . cefTAZidime 2 g in dextrose 5 % 50 mL Inject 2 g into the vein Every Tuesday,Thursday,and Saturday with dialysis.    . cephALEXin (KEFLEX) 500 MG capsule Take 1 capsule (500 mg total) by mouth daily. On the day of your dialysis please take the Keflex after the dialysis session 7 capsule 0  . cinacalcet (SENSIPAR) 30 MG tablet Take 30 mg by mouth daily.    . clopidogrel (PLAVIX) 75 MG tablet TAKE ONE TABLET   BY MOUTH   DAILY 30 tablet 9  . docusate sodium (COLACE) 100 MG capsule Reported on 09/09/2015    . doxycycline (VIBRAMYCIN) 100 MG capsule Take 1 capsule (100 mg total) by mouth 2 (two) times daily. 14 capsule 0  . Kennis Carina  1000 MG chewable tablet Chew 2,000 mg by mouth 3 (three) times daily with meals.     . insulin detemir (LEVEMIR) 100 UNIT/ML injection Inject 0.1 mLs (10 Units total) into the skin at bedtime. (Patient taking differently: Inject 0.53ml (10 units) into skin at bedtime to control blood sugar) 10 mL 11  . oxyCODONE-acetaminophen (ROXICET) 5-325 MG tablet Take 1 tablet by mouth every 4 (four) hours as needed for severe pain. 60 tablet 0  . promethazine (PHENERGAN) 12.5 MG tablet Take 1 tablet by mouth daily as needed.    . Protein (PROCEL PO) Two scoops twice daily    . saccharomyces boulardii (FLORASTOR) 250 MG capsule Take 1 capsule (250 mg total) by mouth 2 (two) times daily. 30 capsule 0  . warfarin (COUMADIN) 5 MG tablet Take 1 tablet (5 mg total) by mouth  daily. 30 tablet 1   No current facility-administered medications for this visit.     REVIEW OF SYSTEMS:  [X]  denotes positive finding, [ ]  denotes negative finding Cardiac  Comments:  Chest pain or chest pressure:    Shortness of breath upon exertion:    Short of breath when lying flat:    Irregular heart rhythm:        Vascular    Pain in calf, thigh, or hip brought on by ambulation:    Pain in feet at night that wakes you up from your sleep:     Blood clot in your veins:    Leg swelling:         Pulmonary    Oxygen at home:    Productive cough:     Wheezing:         Neurologic    Sudden weakness in arms or legs:     Sudden numbness in arms or legs:     Sudden onset of difficulty speaking or slurred speech:    Temporary loss of vision in one eye:     Problems with dizziness:         Gastrointestinal    Blood in stool:     Vomited blood:         Genitourinary    Burning when urinating:     Blood in urine:        Psychiatric    Major depression:         Hematologic    Bleeding problems:    Problems with blood clotting too easily:        Skin    Rashes or ulcers:        Constitutional    Fever or chills:     PHYSICAL EXAM:   Vitals:   09/01/17 1225 09/01/17 1228  BP: (!) 151/102 (!) 146/99  Pulse: 98   Resp: 18   Temp: (!) 97 F (36.1 C)   TempSrc: Oral   SpO2: 98%   Weight: 130 lb (59 kg)   Height: 5\' 9"  (1.753 m)     GENERAL: The patient is a well-nourished female, in no acute distress. The vital signs are documented above. CARDIAC: There is a regular rate and rhythm.  VASCULAR: I do not detect carotid bruits. She has a palpable radial pulse bilaterally. She has no upper extremity swelling. PULMONARY: There is good air exchange bilaterally without wheezing or rales. MUSCULOSKELETAL: She has a left below the knee amputation. NEUROLOGIC: No focal weakness or paresthesias are detected. SKIN: She had an eschar over the midsection of her right  upper arm fistula which I  removed.  Beneath that there is a small wound but no exposed fistula. PSYCHIATRIC: The patient has a normal affect.  DATA:    BILATERAL UPPER EXTREMITY VEIN MAPPING: I have independently interpreted the bilateral upper extremity vein map.  On the right side the cephalic vein is not adequate for a fistula.  The upper arm cephalic vein cannot be visualized.  The basilic vein is not adequate on the right either.  On the left side the forearm and upper arm cephalic vein are very small and not adequate for a fistula.  The basilic vein is not adequate.  UPPER EXTREMITY ARTERIAL DUPLEX: I have independently interpreted the upper extremity arterial duplex scan.  There are triphasic Doppler signals in the radial and ulnar positions bilaterally.  The right brachial artery measures 0.38 cm in diameter.  The left brachial artery measures 0.46 cm in diameter.  MEDICAL ISSUES:   END-STAGE RENAL DISEASE: Her next options for access are a right upper arm graft for a left upper arm graft.  She is previously had an upper arm graft on the left but it looks like there might be some room to get above this incision.  I would like to rule out a central venous stenosis however before scheduling access.  We will schedule this on a nondialysis day and this has been scheduled for Friday, 09/17/2017.  Assuming the central veins on the left are opened will plan on placing a new left upper arm graft.  In the meantime hopefully the wound in the right arm that healed by then as I would be reluctant to place a graft in either arm until this has healed.  Waverly Ferrarihristopher Jessice Madill [facility] [phone]

## 2017-09-01 NOTE — Progress Notes (Signed)
OFFICE CONSULT NOTE  Chief Complaint:  Follow-up afib  Primary Care Physician: Mirna MiresHill, Gerald, MD  HPI:  Jackie [name] is a 83 y.o. female who is being seen today for the evaluation of atrial fibrillation at the request of Mirna MiresHill, Gerald, MD. This is a pleasant 83 yo female who was recently seen by Dr. Loleta ChanceHill.  She was referred for evaluation of new onset atrial fibrillation.  EKG showed atrial fibrillation with controlled ventricular response.  Today she is in A. fib with heart rate just over 100.  She is asymptomatic with this.  Unfortunately she has end-stage renal disease on dialysis (T/TH/SA), and additional risk factors including hyperlipidemia, hypertension, diabetes, PAD status post interventions in 2016 and is status post left BKA.  Her PCP had started her on Xarelto 10 mg daily.  We had a long discussion about the dose of this medication and the fact that Xarelto is not been studied in end-stage renal disease.  In addition the typical doses for treatment of A. fib are 15 or 20 mg.   09/01/2017  Jackie [name] was seen today in follow-up.  Over the past year she has had difficulty with her right upper arm fistula.  This is thrombosed and she is now being dialyzed through temporary catheter.  She has follow-up today with Dr. Durwin Noraixon.  She remains in A. fib which is long-standing persistent.  Heart rate control is very good with rates in the 80s.  She is not on any rate controlling medications.  She is on Plavix and warfarin.  This is more ideal given the fact she is on end-stage renal disease patient.  Denies any chest pain or short worsening shortness of breath.  PMHx:  Past Medical History:  Diagnosis Date  . Anemia   . Arthritis   . Diabetes mellitus   . End-stage renal disease on hemodialysis (HCC)    dialysis T/Th/Sa  . Hyperlipidemia   . Hypertension   . Pneumonia   . Thyroid disease    pt denies this    Past Surgical History:  Procedure Laterality Date  . AMPUTATION Left 04/24/2015    Procedure: LEFT BELOW KNEE AMPUTATION;  Surgeon: Nadara MustardMarcus Duda V, MD;  Location: [facility] OR;  Service: Orthopedics;  Laterality: Left;  . ARTERIOVENOUS GRAFT PLACEMENT Left    non function  . AV FISTULA PLACEMENT Right 01/30/2013   Procedure: ARTERIOVENOUS (AV) FISTULA CREATION;  Surgeon: Fransisco HertzBrian L Chen, MD;  [facility];  Service: Vascular;  Laterality: Right;  . CESAREAN SECTION    . EYE SURGERY Bilateral    catarct  . FISTULA SUPERFICIALIZATION Right 04/24/2013   Procedure: FISTULA SUPERFICIALIZATION;  Surgeon: Larina Earthlyodd F Early, MD;  [facility];  Service: Vascular;  Laterality: Right;  . FISTULOGRAM Right 07/26/2013   Procedure: FISTULOGRAM;  Surgeon: Larina Earthlyodd F Early, MD;  [facility];  Service: Cardiovascular;  Laterality: Right;  . INSERTION OF DIALYSIS CATHETER Right 04/24/2013   Procedure: INSERTION OF DIALYSIS CATHETER;  Surgeon: Larina Earthlyodd F Early, MD;  [facility];  Service: Vascular;  Laterality: Right;  . PERIPHERAL VASCULAR CATHETERIZATION N/A 12/05/2014   Procedure: Abdominal Aortogram;  Surgeon: Fransisco HertzBrian L Chen, MD;  [facility];  Service: Cardiovascular;  Laterality: N/A;  . PERIPHERAL VASCULAR CATHETERIZATION N/A 03/19/2015   Procedure: Lower Extremity Angiography;  Surgeon: Nada LibmanVance W Brabham, MD;  Location: [facility] INVASIVE CV LAB;  Service: Cardiovascular;  Laterality: N/A;  . PERIPHERAL VASCULAR CATHETERIZATION  03/19/2015   Procedure: Peripheral Vascular  Intervention;  Surgeon: Nada Libman, MD;  Location: [facility] INVASIVE CV LAB;  Service: Cardiovascular;;  left sfa and popliteal stent  . TONSILLECTOMY      FAMHx:  Family History  Problem Relation Age of Onset  . Diabetes Mother   . Heart disease Mother   . Hypertension Mother     SOCHx:   reports that  has never smoked. she has never used smokeless tobacco. She reports that she does not drink alcohol or use drugs.  ALLERGIES:  No Known Allergies  ROS: Pertinent items noted in HPI and remainder of  comprehensive ROS otherwise negative.  HOME MEDS: Current Outpatient Medications on File Prior to Visit  Medication Sig Dispense Refill  . albuterol (PROVENTIL HFA;VENTOLIN HFA) 108 (90 BASE) MCG/ACT inhaler Inhale 1-2 puffs into the lungs every 6 (six) hours as needed for wheezing. 1 Inhaler 0  . calcium acetate (PHOSLO) 667 MG capsule Take 2 capsules by mouth 3 (three) times daily with meals.    . cefTAZidime 2 g in dextrose 5 % 50 mL Inject 2 g into the vein Every Tuesday,Thursday,and Saturday with dialysis.    . cephALEXin (KEFLEX) 500 MG capsule Take 1 capsule (500 mg total) by mouth daily. On the day of your dialysis please take the Keflex after the dialysis session 7 capsule 0  . cinacalcet (SENSIPAR) 30 MG tablet Take 30 mg by mouth daily.    . clopidogrel (PLAVIX) 75 MG tablet TAKE ONE TABLET   BY MOUTH   DAILY 30 tablet 9  . docusate sodium (COLACE) 100 MG capsule Reported on 09/09/2015    . doxycycline (VIBRAMYCIN) 100 MG capsule Take 1 capsule (100 mg total) by mouth 2 (two) times daily. 14 capsule 0  . FOSRENOL 1000 MG chewable tablet Chew 2,000 mg by mouth 3 (three) times daily with meals.     . insulin detemir (LEVEMIR) 100 UNIT/ML injection Inject 0.1 mLs (10 Units total) into the skin at bedtime. (Patient taking differently: Inject 0.68ml (10 units) into skin at bedtime to control blood sugar) 10 mL 11  . oxyCODONE-acetaminophen (ROXICET) 5-325 MG tablet Take 1 tablet by mouth every 4 (four) hours as needed for severe pain. 60 tablet 0  . promethazine (PHENERGAN) 12.5 MG tablet Take 1 tablet by mouth daily as needed.    . Protein (PROCEL PO) Two scoops twice daily    . saccharomyces boulardii (FLORASTOR) 250 MG capsule Take 1 capsule (250 mg total) by mouth 2 (two) times daily. 30 capsule 0  . warfarin (COUMADIN) 5 MG tablet Take 1 tablet (5 mg total) by mouth daily. 30 tablet 1  . [DISCONTINUED] rivaroxaban (XARELTO) 10 MG TABS tablet Take 10 mg by mouth daily.     No current  facility-administered medications on file prior to visit.     LABS/IMAGING: No results found for this or any previous visit (from the past 48 hour(s)). No results found.  LIPID PANEL:    Component Value Date/Time   CHOL (H) 11/17/2009 0342    236        ATP III CLASSIFICATION:  <200     mg/dL   Desirable  161-096  mg/dL   Borderline High  >=045    mg/dL   High          TRIG 409 (H) 11/17/2009 0342   HDL 21 (L) 11/17/2009 0342   CHOLHDL 11.2 11/17/2009 0342   VLDL 59 (H) 11/17/2009 0342   LDLCALC (H) 11/17/2009 8119  156        Total Cholesterol/HDL:CHD Risk Coronary Heart Disease Risk Table                     Men   Women  1/2 Average Risk   3.4   3.3  Average Risk       5.0   4.4  2 X Average Risk   9.6   7.1  3 X Average Risk  23.4   11.0        Use the calculated Patient Ratio above and the CHD Risk Table to determine the patient's CHD Risk.        ATP III CLASSIFICATION (LDL):  <100     mg/dL   Optimal  478-295  mg/dL   Near or Above                    Optimal  130-159  mg/dL   Borderline  621-308  mg/dL   High  >657     mg/dL   Very High    WEIGHTS: Wt Readings from Last 3 Encounters:  09/01/17 130 lb 9.6 oz (59.2 kg)  08/16/17 135 lb (61.2 kg)  08/08/17 135 lb (61.2 kg)    VITALS: BP 140/80   Pulse 85   Ht 5\' 9"  (1.753 m)   Wt 130 lb 9.6 oz (59.2 kg)   BMI 19.29 kg/m   EXAM: General appearance: alert and no distress Neck: no carotid bruit, no JVD and thyroid not enlarged, symmetric, no tenderness/mass/nodules Lungs: diminished breath sounds bilaterally Heart: irregularly irregular rhythm Abdomen: soft, non-tender; bowel sounds normal; no masses,  no organomegaly Extremities: Left BKA Pulses: 2+ and symmetric Skin: Skin color, texture, turgor normal. No rashes or lesions Neurologic: Grossly normal Psych: Pleasant  EKG: Deferred  ASSESSMENT: 1. Longstanding persistent atrial fibrillation - asymptomatic 2. End-stage renal disease on  hemodialysis 3. Hypertension 4. Dyslipidemia 5. Type 2 diabetes 6. PAD status post left BKA  PLAN: 1.   Jackie [name] is asymptomatic with her A. fib.  She is therapeutically anticoagulated on warfarin.  We will check her INR today.  She remains on hemodialysis but struggling with a clotted fistula.  She is currently being dialyzed through a temporary catheter.  She has follow-up with her vascular surgeons today.  Blood pressure is reasonably well controlled.  She is on no rate controlling medicines but heart rate is generally well controlled as well.  She is status post left BKA.  Follow-up with me annually or sooner as necessary.  Chrystie Nose, MD, [facility], FACP  [place]  [facility]  Medical Director of the [facility]  [facility] Reduction [facility] Diplomate of the American Board of Clinical Lipidology Attending Cardiologist  Direct Dial: [phone]  Fax: [phone]  Website:  [email or web address]  Lisette Abu Hilty 09/01/2017, 8:12 AM

## 2017-09-01 NOTE — Progress Notes (Signed)
  Patient name: Jackie [name] MRN: [id number] DOB: 09/26/1934 Sex: female   REASON FOR CONSULT:    Evaluate for hemodialysis access.  The consult is requested by Dr. James Lin.  HPI:   Jackie [name] is a pleasant 82 y.o. female, who presents for new hemodialysis access.  On 07/29/2017 she had presented with a thrombosed right brachiocephalic fistula.  This is undergone multiple interventions and ultimately this could not be salvaged.  The patient had a tunneled dialysis catheter placed at that time by [facility].  Patient is referred for new hemodialysis access.  She dialyzes on Tuesdays Thursdays and Saturdays.  She has a left IJ tunneled dialysis catheter which has been working well.  She is previously had a left upper arm graft placed.  She is right-handed and would prefer to have access in the left arm.  She denies any recent uremic symptoms.  Specifically she denies nausea, vomiting, fatigue, anorexia, or palpitations.  Past Medical History:  Diagnosis Date  . Anemia   . Arthritis   . Diabetes mellitus   . End-stage renal disease on hemodialysis (HCC)    dialysis T/Th/Sa  . Hyperlipidemia   . Hypertension   . Pneumonia   . Thyroid disease    pt denies this    Family History  Problem Relation Age of Onset  . Diabetes Mother   . Heart disease Mother   . Hypertension Mother     SOCIAL HISTORY: Social History   Socioeconomic History  . Marital status: Widowed    Spouse name: Not on file  . Number of children: Not on file  . Years of education: Not on file  . Highest education level: Not on file  Social Needs  . Financial resource strain: Not on file  . Food insecurity - worry: Not on file  . Food insecurity - inability: Not on file  . Transportation needs - medical: Not on file  . Transportation needs - non-medical: Not on file  Occupational History  . Not on file  Tobacco Use  . Smoking status: Never Smoker  . Smokeless tobacco: Never Used  Substance  and Sexual Activity  . Alcohol use: No  . Drug use: No  . Sexual activity: Not on file  Other Topics Concern  . Not on file  Social History Narrative  . Not on file    No Known Allergies  Current Outpatient Medications  Medication Sig Dispense Refill  . albuterol (PROVENTIL HFA;VENTOLIN HFA) 108 (90 BASE) MCG/ACT inhaler Inhale 1-2 puffs into the lungs every 6 (six) hours as needed for wheezing. 1 Inhaler 0  . calcium acetate (PHOSLO) 667 MG capsule Take 2 capsules by mouth 3 (three) times daily with meals.    . cefTAZidime 2 g in dextrose 5 % 50 mL Inject 2 g into the vein Every Tuesday,Thursday,and Saturday with dialysis.    . cephALEXin (KEFLEX) 500 MG capsule Take 1 capsule (500 mg total) by mouth daily. On the day of your dialysis please take the Keflex after the dialysis session 7 capsule 0  . cinacalcet (SENSIPAR) 30 MG tablet Take 30 mg by mouth daily.    . clopidogrel (PLAVIX) 75 MG tablet TAKE ONE TABLET   BY MOUTH   DAILY 30 tablet 9  . docusate sodium (COLACE) 100 MG capsule Reported on 09/09/2015    . doxycycline (VIBRAMYCIN) 100 MG capsule Take 1 capsule (100 mg total) by mouth 2 (two) times daily. 14 capsule 0  . FOSRENOL   1000 MG chewable tablet Chew 2,000 mg by mouth 3 (three) times daily with meals.     . insulin detemir (LEVEMIR) 100 UNIT/ML injection Inject 0.1 mLs (10 Units total) into the skin at bedtime. (Patient taking differently: Inject 0.1ml (10 units) into skin at bedtime to control blood sugar) 10 mL 11  . oxyCODONE-acetaminophen (ROXICET) 5-325 MG tablet Take 1 tablet by mouth every 4 (four) hours as needed for severe pain. 60 tablet 0  . promethazine (PHENERGAN) 12.5 MG tablet Take 1 tablet by mouth daily as needed.    . Protein (PROCEL PO) Two scoops twice daily    . saccharomyces boulardii (FLORASTOR) 250 MG capsule Take 1 capsule (250 mg total) by mouth 2 (two) times daily. 30 capsule 0  . warfarin (COUMADIN) 5 MG tablet Take 1 tablet (5 mg total) by mouth  daily. 30 tablet 1   No current facility-administered medications for this visit.     REVIEW OF SYSTEMS:  [X] denotes positive finding, [ ] denotes negative finding Cardiac  Comments:  Chest pain or chest pressure:    Shortness of breath upon exertion:    Short of breath when lying flat:    Irregular heart rhythm:        Vascular    Pain in calf, thigh, or hip brought on by ambulation:    Pain in feet at night that wakes you up from your sleep:     Blood clot in your veins:    Leg swelling:         Pulmonary    Oxygen at home:    Productive cough:     Wheezing:         Neurologic    Sudden weakness in arms or legs:     Sudden numbness in arms or legs:     Sudden onset of difficulty speaking or slurred speech:    Temporary loss of vision in one eye:     Problems with dizziness:         Gastrointestinal    Blood in stool:     Vomited blood:         Genitourinary    Burning when urinating:     Blood in urine:        Psychiatric    Major depression:         Hematologic    Bleeding problems:    Problems with blood clotting too easily:        Skin    Rashes or ulcers:        Constitutional    Fever or chills:     PHYSICAL EXAM:   Vitals:   09/01/17 1225 09/01/17 1228  BP: (!) 151/102 (!) 146/99  Pulse: 98   Resp: 18   Temp: (!) 97 F (36.1 C)   TempSrc: Oral   SpO2: 98%   Weight: 130 lb (59 kg)   Height: 5' 9" (1.753 m)     GENERAL: The patient is a well-nourished female, in no acute distress. The vital signs are documented above. CARDIAC: There is a regular rate and rhythm.  VASCULAR: I do not detect carotid bruits. She has a palpable radial pulse bilaterally. She has no upper extremity swelling. PULMONARY: There is good air exchange bilaterally without wheezing or rales. MUSCULOSKELETAL: She has a left below the knee amputation. NEUROLOGIC: No focal weakness or paresthesias are detected. SKIN: She had an eschar over the midsection of her right  upper arm fistula which I   removed.  Beneath that there is a small wound but no exposed fistula. PSYCHIATRIC: The patient has a normal affect.  DATA:    BILATERAL UPPER EXTREMITY VEIN MAPPING: I have independently interpreted the bilateral upper extremity vein map.  On the right side the cephalic vein is not adequate for a fistula.  The upper arm cephalic vein cannot be visualized.  The basilic vein is not adequate on the right either.  On the left side the forearm and upper arm cephalic vein are very small and not adequate for a fistula.  The basilic vein is not adequate.  UPPER EXTREMITY ARTERIAL DUPLEX: I have independently interpreted the upper extremity arterial duplex scan.  There are triphasic Doppler signals in the radial and ulnar positions bilaterally.  The right brachial artery measures 0.38 cm in diameter.  The left brachial artery measures 0.46 cm in diameter.  MEDICAL ISSUES:   END-STAGE RENAL DISEASE: Her next options for access are a right upper arm graft for a left upper arm graft.  She is previously had an upper arm graft on the left but it looks like there might be some room to get above this incision.  I would like to rule out a central venous stenosis however before scheduling access.  We will schedule this on a nondialysis day and this has been scheduled for Friday, 09/17/2017.  Assuming the central veins on the left are opened will plan on placing a new left upper arm graft.  In the meantime hopefully the wound in the right arm that healed by then as I would be reluctant to place a graft in either arm until this has healed.  Christopher Dickson [facility] [place] [facility] [phone] 

## 2017-09-01 NOTE — Patient Instructions (Signed)
Medication Instructions:  Your physician recommends that you continue on your current medications as directed. Please refer to the Current Medication list given to you today.  Labwork: None   Testing/Procedures: None   Follow-Up: Your physician wants you to follow-up in: 12 months with Dr Hilty. You will receive a reminder letter in the mail two months in advance. If you don't receive a letter, please call our office to schedule the follow-up appointment.  Any Other Special Instructions Will Be Listed Below (If Applicable).     If you need a refill on your cardiac medications before your next appointment, please call your pharmacy.  

## 2017-09-02 DIAGNOSIS — N2581 Secondary hyperparathyroidism of renal origin: Secondary | ICD-10-CM | POA: Diagnosis not present

## 2017-09-02 DIAGNOSIS — D509 Iron deficiency anemia, unspecified: Secondary | ICD-10-CM | POA: Diagnosis not present

## 2017-09-02 DIAGNOSIS — E1129 Type 2 diabetes mellitus with other diabetic kidney complication: Secondary | ICD-10-CM | POA: Diagnosis not present

## 2017-09-02 DIAGNOSIS — D631 Anemia in chronic kidney disease: Secondary | ICD-10-CM | POA: Diagnosis not present

## 2017-09-02 DIAGNOSIS — N186 End stage renal disease: Secondary | ICD-10-CM | POA: Diagnosis not present

## 2017-09-03 ENCOUNTER — Encounter: Payer: Self-pay | Admitting: Nephrology

## 2017-09-04 DIAGNOSIS — N2581 Secondary hyperparathyroidism of renal origin: Secondary | ICD-10-CM | POA: Diagnosis not present

## 2017-09-04 DIAGNOSIS — N186 End stage renal disease: Secondary | ICD-10-CM | POA: Diagnosis not present

## 2017-09-04 DIAGNOSIS — D509 Iron deficiency anemia, unspecified: Secondary | ICD-10-CM | POA: Diagnosis not present

## 2017-09-04 DIAGNOSIS — E1129 Type 2 diabetes mellitus with other diabetic kidney complication: Secondary | ICD-10-CM | POA: Diagnosis not present

## 2017-09-04 DIAGNOSIS — D631 Anemia in chronic kidney disease: Secondary | ICD-10-CM | POA: Diagnosis not present

## 2017-09-07 DIAGNOSIS — D631 Anemia in chronic kidney disease: Secondary | ICD-10-CM | POA: Diagnosis not present

## 2017-09-07 DIAGNOSIS — N186 End stage renal disease: Secondary | ICD-10-CM | POA: Diagnosis not present

## 2017-09-07 DIAGNOSIS — E1129 Type 2 diabetes mellitus with other diabetic kidney complication: Secondary | ICD-10-CM | POA: Diagnosis not present

## 2017-09-07 DIAGNOSIS — N2581 Secondary hyperparathyroidism of renal origin: Secondary | ICD-10-CM | POA: Diagnosis not present

## 2017-09-07 DIAGNOSIS — D509 Iron deficiency anemia, unspecified: Secondary | ICD-10-CM | POA: Diagnosis not present

## 2017-09-08 ENCOUNTER — Ambulatory Visit (INDEPENDENT_AMBULATORY_CARE_PROVIDER_SITE_OTHER): Payer: Medicare Other | Admitting: Pharmacist Clinician (PhC)/ Clinical Pharmacy Specialist

## 2017-09-08 DIAGNOSIS — Z7901 Long term (current) use of anticoagulants: Secondary | ICD-10-CM

## 2017-09-08 DIAGNOSIS — I4891 Unspecified atrial fibrillation: Secondary | ICD-10-CM | POA: Diagnosis not present

## 2017-09-08 LAB — POCT INR: INR: 2.5

## 2017-09-08 NOTE — Patient Instructions (Signed)
Description   Continue with 1 tablet daily except 1.5 tablets each Monday , Wednesday, and Friday.  Last dose prior to procedure on Monday Feb 25.  Restart warfarin Friday March 1 unless told otherwise.

## 2017-09-11 DIAGNOSIS — E1129 Type 2 diabetes mellitus with other diabetic kidney complication: Secondary | ICD-10-CM | POA: Diagnosis not present

## 2017-09-11 DIAGNOSIS — N2581 Secondary hyperparathyroidism of renal origin: Secondary | ICD-10-CM | POA: Diagnosis not present

## 2017-09-11 DIAGNOSIS — D509 Iron deficiency anemia, unspecified: Secondary | ICD-10-CM | POA: Diagnosis not present

## 2017-09-11 DIAGNOSIS — D631 Anemia in chronic kidney disease: Secondary | ICD-10-CM | POA: Diagnosis not present

## 2017-09-11 DIAGNOSIS — N186 End stage renal disease: Secondary | ICD-10-CM | POA: Diagnosis not present

## 2017-09-14 DIAGNOSIS — D509 Iron deficiency anemia, unspecified: Secondary | ICD-10-CM | POA: Diagnosis not present

## 2017-09-14 DIAGNOSIS — E1129 Type 2 diabetes mellitus with other diabetic kidney complication: Secondary | ICD-10-CM | POA: Diagnosis not present

## 2017-09-14 DIAGNOSIS — N186 End stage renal disease: Secondary | ICD-10-CM | POA: Diagnosis not present

## 2017-09-14 DIAGNOSIS — N2581 Secondary hyperparathyroidism of renal origin: Secondary | ICD-10-CM | POA: Diagnosis not present

## 2017-09-14 DIAGNOSIS — D631 Anemia in chronic kidney disease: Secondary | ICD-10-CM | POA: Diagnosis not present

## 2017-09-16 DIAGNOSIS — N2581 Secondary hyperparathyroidism of renal origin: Secondary | ICD-10-CM | POA: Diagnosis not present

## 2017-09-16 DIAGNOSIS — E1129 Type 2 diabetes mellitus with other diabetic kidney complication: Secondary | ICD-10-CM | POA: Diagnosis not present

## 2017-09-16 DIAGNOSIS — D509 Iron deficiency anemia, unspecified: Secondary | ICD-10-CM | POA: Diagnosis not present

## 2017-09-16 DIAGNOSIS — D631 Anemia in chronic kidney disease: Secondary | ICD-10-CM | POA: Diagnosis not present

## 2017-09-16 DIAGNOSIS — N186 End stage renal disease: Secondary | ICD-10-CM | POA: Diagnosis not present

## 2017-09-17 ENCOUNTER — Encounter (HOSPITAL_COMMUNITY): Payer: Self-pay | Admitting: Vascular Surgery

## 2017-09-17 ENCOUNTER — Ambulatory Visit (HOSPITAL_COMMUNITY)
Admission: RE | Admit: 2017-09-17 | Discharge: 2017-09-17 | Disposition: A | Discharge status: Home / Self Care | Payer: Medicare Other | Source: Ambulatory Visit | Attending: Vascular Surgery | Admitting: Vascular Surgery

## 2017-09-17 ENCOUNTER — Encounter (HOSPITAL_COMMUNITY)
Admission: RE | Disposition: A | Discharge status: Home / Self Care | Payer: Self-pay | Source: Ambulatory Visit | Attending: Vascular Surgery

## 2017-09-17 DIAGNOSIS — E079 Disorder of thyroid, unspecified: Secondary | ICD-10-CM | POA: Insufficient documentation

## 2017-09-17 DIAGNOSIS — Z7902 Long term (current) use of antithrombotics/antiplatelets: Secondary | ICD-10-CM | POA: Insufficient documentation

## 2017-09-17 DIAGNOSIS — Z794 Long term (current) use of insulin: Secondary | ICD-10-CM | POA: Diagnosis not present

## 2017-09-17 DIAGNOSIS — Z7901 Long term (current) use of anticoagulants: Secondary | ICD-10-CM | POA: Insufficient documentation

## 2017-09-17 DIAGNOSIS — D631 Anemia in chronic kidney disease: Secondary | ICD-10-CM | POA: Diagnosis not present

## 2017-09-17 DIAGNOSIS — E1122 Type 2 diabetes mellitus with diabetic chronic kidney disease: Secondary | ICD-10-CM | POA: Insufficient documentation

## 2017-09-17 DIAGNOSIS — M1990 Unspecified osteoarthritis, unspecified site: Secondary | ICD-10-CM | POA: Insufficient documentation

## 2017-09-17 DIAGNOSIS — I120 Hypertensive chronic kidney disease with stage 5 chronic kidney disease or end stage renal disease: Secondary | ICD-10-CM | POA: Insufficient documentation

## 2017-09-17 DIAGNOSIS — N186 End stage renal disease: Secondary | ICD-10-CM | POA: Diagnosis not present

## 2017-09-17 DIAGNOSIS — E785 Hyperlipidemia, unspecified: Secondary | ICD-10-CM | POA: Diagnosis not present

## 2017-09-17 DIAGNOSIS — E1129 Type 2 diabetes mellitus with other diabetic kidney complication: Secondary | ICD-10-CM | POA: Diagnosis not present

## 2017-09-17 DIAGNOSIS — Z992 Dependence on renal dialysis: Secondary | ICD-10-CM | POA: Insufficient documentation

## 2017-09-17 DIAGNOSIS — N2581 Secondary hyperparathyroidism of renal origin: Secondary | ICD-10-CM | POA: Diagnosis not present

## 2017-09-17 DIAGNOSIS — D509 Iron deficiency anemia, unspecified: Secondary | ICD-10-CM | POA: Diagnosis not present

## 2017-09-17 HISTORY — PX: UPPER EXTREMITY VENOGRAPHY: CATH118272

## 2017-09-17 LAB — PROTIME-INR
INR: 1.21
PROTHROMBIN TIME: 15.2 s (ref 11.4–15.2)

## 2017-09-17 LAB — POCT I-STAT, CHEM 8
BUN: 16 mg/dL (ref 6–20)
Calcium, Ion: 1.02 mmol/L — ABNORMAL LOW (ref 1.15–1.40)
Chloride: 100 mmol/L — ABNORMAL LOW (ref 101–111)
Creatinine, Ser: 3.9 mg/dL — ABNORMAL HIGH (ref 0.44–1.00)
Glucose, Bld: 90 mg/dL (ref 65–99)
HEMATOCRIT: 34 % — AB (ref 36.0–46.0)
HEMOGLOBIN: 11.6 g/dL — AB (ref 12.0–15.0)
Potassium: 3.6 mmol/L (ref 3.5–5.1)
Sodium: 142 mmol/L (ref 135–145)
TCO2: 28 mmol/L (ref 22–32)

## 2017-09-17 LAB — GLUCOSE, CAPILLARY: GLUCOSE-CAPILLARY: 78 mg/dL (ref 65–99)

## 2017-09-17 SURGERY — UPPER EXTREMITY VENOGRAPHY
Anesthesia: LOCAL

## 2017-09-17 MED ORDER — SODIUM CHLORIDE 0.9 % IV SOLN: 250.0000 mL | INTRAVENOUS | Status: DC | PRN

## 2017-09-17 MED ORDER — IOPAMIDOL (ISOVUE-370) INJECTION 76%
INTRAVENOUS | Status: DC | PRN
  Administered 2017-09-17: 20 mL via INTRAVENOUS

## 2017-09-17 MED ORDER — SODIUM CHLORIDE 0.9% FLUSH: 3.0000 mL | Freq: Two times a day (BID) | INTRAVENOUS | Status: DC

## 2017-09-17 MED ORDER — SODIUM CHLORIDE 0.9% FLUSH: 3.0000 mL | INTRAVENOUS | Status: DC | PRN

## 2017-09-17 NOTE — Discharge Instructions (Signed)
Venogram, Care After °This sheet gives you information about how to care for yourself after your procedure. Your health care provider may also give you more specific instructions. If you have problems or questions, contact your health care provider. °What can I expect after the procedure? °After the procedure, it is common to have: °· Bruising or mild discomfort in the area where the IV was inserted (insertion site). ° °Follow these instructions at home: °Eating and drinking °· Follow instructions from your health care provider about eating or drinking restrictions. °· Drink a lot of fluids for the first several days after the procedure, as directed by your health care provider. This helps to wash (flush) the contrast out of your body. Examples of healthy fluids include water or low-calorie drinks. °General instructions °· Check your IV insertion area every day for signs of infection. Check for: °? Redness, swelling, or pain. °? Fluid or blood. °? Warmth. °? Pus or a bad smell. °· Take over-the-counter and prescription medicines only as told by your health care provider. °· Rest and return to your normal activities as told by your health care provider. Ask your health care provider what activities are safe for you. °· Do not drive for 24 hours if you were given a medicine to help you relax (sedative), or until your health care provider approves. °· Keep all follow-up visits as told by your health care provider. This is important. °Contact a health care provider if: °· Your skin becomes itchy or you develop a rash or hives. °· You have a fever that does not get better with medicine. °· You feel nauseous. °· You vomit. °· You have redness, swelling, or pain around the insertion site. °· You have fluid or blood coming from the insertion site. °· Your insertion area feels warm to the touch. °· You have pus or a bad smell coming from the insertion site. °Get help right away if: °· You have difficulty breathing or  shortness of breath. °· You develop chest pain. °· You faint. °· You feel very dizzy. °These symptoms may represent a serious problem that is an emergency. Do not wait to see if the symptoms will go away. Get medical help right away. Call your local emergency services (911 in the [place] Do not drive yourself to the hospital. °Summary °· After your procedure, it is common to have bruising or mild discomfort in the area where the IV was inserted. °· You should check your IV insertion area every day for signs of infection. °· Take over-the-counter and prescription medicines only as told by your health care provider. °· You should drink a lot of fluids for the first several days after the procedure to help flush the contrast from your body. °This information is not intended to replace advice given to you by your health care provider. Make sure you discuss any questions you have with your health care provider. °Document Released: 04/26/2013 Document Revised: 05/30/2016 Document Reviewed: 05/30/2016 °Elsevier Interactive Patient Education © 2017 [employer] ° °

## 2017-09-17 NOTE — Op Note (Signed)
   PATIENT: Jackie [name]      MRN: [id number] DOB: 04/11/1935    DATE OF PROCEDURE: 09/17/2017  INDICATIONS:    Jackie [name] is a 83 y.o. female who is had multiple access attempts in both arms and has had upper arm grafts bilaterally.  In addition, on the right side she has had multiple stents placed in the subclavian vein.  Before planning new access I recommended bilateral central venography.  PROCEDURE:    Bilateral central venograms  SURGEON: Di Kindlehristopher S. Edilia Boickson, MD, FACS  ANESTHESIA: None  EBL: Minimal  TECHNIQUE: The patient was brought to the peripheral vascular lab after an IV was placed in both forearms.  Contrast was injected through the IV in the left forearm to evaluate the veins from the antecubital level to include the central veins.  Next contrast was injected in the IV in the right arm to evaluate for a central venous occlusion.  The patient tolerated the procedure well.  FINDINGS:   1.  On the left side, the central veins are patent.  The upper arm brachial vein is patent.  The basilic vein is small. 2.  On the right side, the the axillary and subclavian veins are occluded.  CLINICAL NOTE: I have recommended placement of a left upper arm graft on a nondialysis day.  She dialyzes on Tuesdays Thursdays and Saturdays.  We will arrange this on a Monday.  She has a functioning dialysis catheter in the left IJ.  Waverly Ferrarihristopher Vester Balthazor, MD, FACS [facility]  DATE OF DICTATION:   09/17/2017

## 2017-09-17 NOTE — Interval H&P Note (Signed)
History and Physical Interval Note:  09/17/2017 9:50 AM  [name]  has presented today for surgery, with the diagnosis of swelling - poor flow in fistula  The various methods of treatment have been discussed with the patient and family. After consideration of risks, benefits and other options for treatment, the patient has consented to  Procedure(s): UPPER EXTREMITY VENOGRAPHY (N/A) as a surgical intervention .  The patient's history has been reviewed, patient examined, no change in status, stable for surgery.  I have reviewed the patient's chart and labs.  Questions were answered to the patient's satisfaction.     Jackie Ferrarihristopher Tennyson Wacha

## 2017-09-18 DIAGNOSIS — N186 End stage renal disease: Secondary | ICD-10-CM | POA: Diagnosis not present

## 2017-09-18 DIAGNOSIS — D631 Anemia in chronic kidney disease: Secondary | ICD-10-CM | POA: Diagnosis not present

## 2017-09-18 DIAGNOSIS — D509 Iron deficiency anemia, unspecified: Secondary | ICD-10-CM | POA: Diagnosis not present

## 2017-09-18 DIAGNOSIS — N2581 Secondary hyperparathyroidism of renal origin: Secondary | ICD-10-CM | POA: Diagnosis not present

## 2017-09-18 DIAGNOSIS — E1129 Type 2 diabetes mellitus with other diabetic kidney complication: Secondary | ICD-10-CM | POA: Diagnosis not present

## 2017-09-21 ENCOUNTER — Other Ambulatory Visit: Payer: Self-pay | Admitting: *Deleted

## 2017-09-21 ENCOUNTER — Telehealth: Payer: Self-pay | Admitting: *Deleted

## 2017-09-21 DIAGNOSIS — E1129 Type 2 diabetes mellitus with other diabetic kidney complication: Secondary | ICD-10-CM | POA: Diagnosis not present

## 2017-09-21 DIAGNOSIS — N186 End stage renal disease: Secondary | ICD-10-CM | POA: Diagnosis not present

## 2017-09-21 DIAGNOSIS — D509 Iron deficiency anemia, unspecified: Secondary | ICD-10-CM | POA: Diagnosis not present

## 2017-09-21 DIAGNOSIS — D631 Anemia in chronic kidney disease: Secondary | ICD-10-CM | POA: Diagnosis not present

## 2017-09-21 DIAGNOSIS — N2581 Secondary hyperparathyroidism of renal origin: Secondary | ICD-10-CM | POA: Diagnosis not present

## 2017-09-21 NOTE — Telephone Encounter (Signed)
   [facility] Pre-operative Risk Assessment    Request for surgical clearance:  1. What type of surgery is being performed? Left AVG placement   2. When is this surgery scheduled? 10/04/17   3. What type of clearance is required (medical clearance vs. Pharmacy clearance to hold med vs. Both)? med  4. Are there any medications that need to be held prior to surgery and how long?Coumadin 3 days prior (last dose 3/14), she will continue Plavix   5. Practice name and name of physician performing surgery? [facility] Dock   6. What is your office phone and fax number? P- [phone]   7. Anesthesia type (None, local, MAC, general) ?    Jackie [name] Jackie [name] 09/21/2017, 2:21 PM  _________________________________________________________________   (provider comments below)

## 2017-09-22 NOTE — Telephone Encounter (Signed)
   Primary Cardiologist:Kenneth C Hilty, MD  Chart reviewed as part of pre-operative protocol coverage. Will route to pharm for input on Coumadin.   Laurann Montanaayna N Dunn, PA-C 09/22/2017, 3:38 PM

## 2017-09-23 DIAGNOSIS — N186 End stage renal disease: Secondary | ICD-10-CM | POA: Diagnosis not present

## 2017-09-23 DIAGNOSIS — D631 Anemia in chronic kidney disease: Secondary | ICD-10-CM | POA: Diagnosis not present

## 2017-09-23 DIAGNOSIS — D509 Iron deficiency anemia, unspecified: Secondary | ICD-10-CM | POA: Diagnosis not present

## 2017-09-23 DIAGNOSIS — N2581 Secondary hyperparathyroidism of renal origin: Secondary | ICD-10-CM | POA: Diagnosis not present

## 2017-09-23 DIAGNOSIS — E1129 Type 2 diabetes mellitus with other diabetic kidney complication: Secondary | ICD-10-CM | POA: Diagnosis not present

## 2017-09-23 NOTE — Telephone Encounter (Signed)
Attempted to call the patient, there is no voicemail set up for her phone.Called patient's son Hessie Dienerlan who is listed on the patient's DPR. Hessie Dienerlan states the patient is at dialysis and not available by phone this afternoon, explained to him the reason for the call. He will have the patient call our office tomorrow. Asked him to have her call between 1:30-5pm M-F and to ask for the pre-op provider. Hessie Dienerlan verbalized understanding.

## 2017-09-23 NOTE — Telephone Encounter (Signed)
Pt takes Coumadin for afib with CHADS2VASc score of 6 (age x2, sex, HTN, PAD, DM). Ok to hold Coumadin for 3 days prior to AVG placement.

## 2017-09-23 NOTE — Telephone Encounter (Signed)
Unable to leave message on her phone as it will keep ringing without a voice mail option. Please reach out to the patient and have her call cardiology on call preop APP for final clearance. Thank you

## 2017-09-24 ENCOUNTER — Ambulatory Visit (INDEPENDENT_AMBULATORY_CARE_PROVIDER_SITE_OTHER): Payer: Medicare Other | Admitting: Pharmacist

## 2017-09-24 DIAGNOSIS — I4891 Unspecified atrial fibrillation: Secondary | ICD-10-CM

## 2017-09-24 DIAGNOSIS — Z7901 Long term (current) use of anticoagulants: Secondary | ICD-10-CM

## 2017-09-24 LAB — POCT INR: INR: 1

## 2017-09-25 DIAGNOSIS — N186 End stage renal disease: Secondary | ICD-10-CM | POA: Diagnosis not present

## 2017-09-25 DIAGNOSIS — D509 Iron deficiency anemia, unspecified: Secondary | ICD-10-CM | POA: Diagnosis not present

## 2017-09-25 DIAGNOSIS — E1129 Type 2 diabetes mellitus with other diabetic kidney complication: Secondary | ICD-10-CM | POA: Diagnosis not present

## 2017-09-25 DIAGNOSIS — N2581 Secondary hyperparathyroidism of renal origin: Secondary | ICD-10-CM | POA: Diagnosis not present

## 2017-09-25 DIAGNOSIS — D631 Anemia in chronic kidney disease: Secondary | ICD-10-CM | POA: Diagnosis not present

## 2017-09-28 ENCOUNTER — Other Ambulatory Visit: Payer: Self-pay | Admitting: Internal Medicine

## 2017-09-28 DIAGNOSIS — D631 Anemia in chronic kidney disease: Secondary | ICD-10-CM | POA: Diagnosis not present

## 2017-09-28 DIAGNOSIS — N186 End stage renal disease: Secondary | ICD-10-CM | POA: Diagnosis not present

## 2017-09-28 DIAGNOSIS — E1129 Type 2 diabetes mellitus with other diabetic kidney complication: Secondary | ICD-10-CM | POA: Diagnosis not present

## 2017-09-28 DIAGNOSIS — N2581 Secondary hyperparathyroidism of renal origin: Secondary | ICD-10-CM | POA: Diagnosis not present

## 2017-09-28 DIAGNOSIS — D509 Iron deficiency anemia, unspecified: Secondary | ICD-10-CM | POA: Diagnosis not present

## 2017-09-28 NOTE — Telephone Encounter (Signed)
Pt does not need to see provider for clearance. Clearance was only for Coumadin and this has been addressed. Pt seeing coumadin clinic at [facility] on Friday. Will forward to [facility] coumadin clinic.

## 2017-09-28 NOTE — Telephone Encounter (Signed)
Thank you for the update. Will discuss with patient during f/u visit on 10/01/17

## 2017-09-28 NOTE — Telephone Encounter (Signed)
Follow up    Melissa nurse is calling [facility] to obtain information that the patient needs for cardiac clearance. The patient contacted them advising that she is not able to reach the pre-op team. Please call to discuss.

## 2017-09-30 DIAGNOSIS — E1129 Type 2 diabetes mellitus with other diabetic kidney complication: Secondary | ICD-10-CM | POA: Diagnosis not present

## 2017-09-30 DIAGNOSIS — N186 End stage renal disease: Secondary | ICD-10-CM | POA: Diagnosis not present

## 2017-09-30 DIAGNOSIS — N2581 Secondary hyperparathyroidism of renal origin: Secondary | ICD-10-CM | POA: Diagnosis not present

## 2017-09-30 DIAGNOSIS — D509 Iron deficiency anemia, unspecified: Secondary | ICD-10-CM | POA: Diagnosis not present

## 2017-09-30 DIAGNOSIS — D631 Anemia in chronic kidney disease: Secondary | ICD-10-CM | POA: Diagnosis not present

## 2017-10-01 ENCOUNTER — Encounter (HOSPITAL_COMMUNITY): Payer: Self-pay | Admitting: *Deleted

## 2017-10-01 ENCOUNTER — Ambulatory Visit (INDEPENDENT_AMBULATORY_CARE_PROVIDER_SITE_OTHER): Payer: Medicare Other | Admitting: Pharmacist

## 2017-10-01 ENCOUNTER — Other Ambulatory Visit: Payer: Self-pay

## 2017-10-01 DIAGNOSIS — Z7901 Long term (current) use of anticoagulants: Secondary | ICD-10-CM | POA: Diagnosis not present

## 2017-10-01 DIAGNOSIS — I4891 Unspecified atrial fibrillation: Secondary | ICD-10-CM | POA: Diagnosis not present

## 2017-10-01 LAB — POCT INR: INR: 1.1

## 2017-10-01 NOTE — Patient Instructions (Addendum)
Hold warfarin dose Friday 3/15, Saturday 3/16, and Sunday 3/17. On the evening of your procedure (3/18), resume warfarin dose take 1.5 tablets and then continue with 1 tablet daily except 1.5 tablets each Monday, Wednesday, and Friday. Repeat INR in 1 week after procedure. Please bring medications to next coumadin visit.

## 2017-10-01 NOTE — Progress Notes (Signed)
Spoke with pt for pre-op call. Pt has recently been diagnosed with A-fib. Was started on Coumadin. Pt's last dose was 09/30/17 as instructed by the Coumadin Clinic. Pt denies any chest pain or sob. Pt is a diabetic. Pt not sure of what her A1C is. She states her fasting blood sugar is usually around 90. Pt instructed to take 1/2 of her regular dose of Levemir Insulin Sunday evening (will take 7 units). Instructed pt to check her blood sugar Monday AM when she gets up and every 2 hours until she leaves for the hospital. If blood sugar is 70 or below, treat with 1/2 cup of clear juice (apple or cranberry) and recheck blood sugar 15 minutes after drinking juice. If blood sugar continues to be 70 or below, call the Short Stay department and ask to speak to a nurse. She voiced understanding. [facility] and asked for a most recent A1C. Last A1C was 5.5 on 07/31/17.

## 2017-10-02 DIAGNOSIS — D509 Iron deficiency anemia, unspecified: Secondary | ICD-10-CM | POA: Diagnosis not present

## 2017-10-02 DIAGNOSIS — E1129 Type 2 diabetes mellitus with other diabetic kidney complication: Secondary | ICD-10-CM | POA: Diagnosis not present

## 2017-10-02 DIAGNOSIS — D631 Anemia in chronic kidney disease: Secondary | ICD-10-CM | POA: Diagnosis not present

## 2017-10-02 DIAGNOSIS — N186 End stage renal disease: Secondary | ICD-10-CM | POA: Diagnosis not present

## 2017-10-02 DIAGNOSIS — N2581 Secondary hyperparathyroidism of renal origin: Secondary | ICD-10-CM | POA: Diagnosis not present

## 2017-10-04 ENCOUNTER — Ambulatory Visit (HOSPITAL_COMMUNITY): Payer: Medicare Other | Admitting: Certified Registered Nurse Anesthetist

## 2017-10-04 ENCOUNTER — Encounter (HOSPITAL_COMMUNITY)
Admission: RE | Disposition: A | Discharge status: Home / Self Care | Payer: Self-pay | Source: Ambulatory Visit | Attending: Vascular Surgery

## 2017-10-04 ENCOUNTER — Other Ambulatory Visit: Payer: Self-pay

## 2017-10-04 ENCOUNTER — Encounter (HOSPITAL_COMMUNITY): Payer: Self-pay

## 2017-10-04 ENCOUNTER — Ambulatory Visit (HOSPITAL_COMMUNITY)
Admission: RE | Admit: 2017-10-04 | Discharge: 2017-10-04 | Disposition: A | Discharge status: Home / Self Care | Payer: Medicare Other | Source: Ambulatory Visit | Attending: Vascular Surgery | Admitting: Vascular Surgery

## 2017-10-04 DIAGNOSIS — N186 End stage renal disease: Secondary | ICD-10-CM | POA: Insufficient documentation

## 2017-10-04 DIAGNOSIS — E1122 Type 2 diabetes mellitus with diabetic chronic kidney disease: Secondary | ICD-10-CM | POA: Insufficient documentation

## 2017-10-04 DIAGNOSIS — Z7984 Long term (current) use of oral hypoglycemic drugs: Secondary | ICD-10-CM | POA: Diagnosis not present

## 2017-10-04 DIAGNOSIS — Z7901 Long term (current) use of anticoagulants: Secondary | ICD-10-CM | POA: Diagnosis not present

## 2017-10-04 DIAGNOSIS — Z794 Long term (current) use of insulin: Secondary | ICD-10-CM | POA: Diagnosis not present

## 2017-10-04 DIAGNOSIS — I120 Hypertensive chronic kidney disease with stage 5 chronic kidney disease or end stage renal disease: Secondary | ICD-10-CM | POA: Diagnosis not present

## 2017-10-04 DIAGNOSIS — I871 Compression of vein: Secondary | ICD-10-CM | POA: Diagnosis not present

## 2017-10-04 DIAGNOSIS — Z7902 Long term (current) use of antithrombotics/antiplatelets: Secondary | ICD-10-CM | POA: Diagnosis not present

## 2017-10-04 DIAGNOSIS — Z79899 Other long term (current) drug therapy: Secondary | ICD-10-CM | POA: Diagnosis not present

## 2017-10-04 DIAGNOSIS — Z992 Dependence on renal dialysis: Secondary | ICD-10-CM | POA: Insufficient documentation

## 2017-10-04 DIAGNOSIS — E785 Hyperlipidemia, unspecified: Secondary | ICD-10-CM | POA: Insufficient documentation

## 2017-10-04 DIAGNOSIS — T82898A Other specified complication of vascular prosthetic devices, implants and grafts, initial encounter: Secondary | ICD-10-CM | POA: Diagnosis not present

## 2017-10-04 DIAGNOSIS — I4891 Unspecified atrial fibrillation: Secondary | ICD-10-CM | POA: Diagnosis not present

## 2017-10-04 HISTORY — PX: AV FISTULA PLACEMENT: SHX1204

## 2017-10-04 LAB — APTT: APTT: 30 s (ref 24–36)

## 2017-10-04 LAB — POCT I-STAT 4, (NA,K, GLUC, HGB,HCT)
Glucose, Bld: 78 mg/dL (ref 65–99)
HEMATOCRIT: 38 % (ref 36.0–46.0)
HEMOGLOBIN: 12.9 g/dL (ref 12.0–15.0)
Potassium: 3.6 mmol/L (ref 3.5–5.1)
Sodium: 139 mmol/L (ref 135–145)

## 2017-10-04 LAB — PROTIME-INR
INR: 1.08
Prothrombin Time: 14 seconds (ref 11.4–15.2)

## 2017-10-04 LAB — GLUCOSE, CAPILLARY
GLUCOSE-CAPILLARY: 81 mg/dL (ref 65–99)
Glucose-Capillary: 82 mg/dL (ref 65–99)

## 2017-10-04 SURGERY — INSERTION OF ARTERIOVENOUS (AV) GORE-TEX GRAFT ARM
Anesthesia: Monitor Anesthesia Care | Laterality: Left

## 2017-10-04 MED ORDER — MEPERIDINE HCL 50 MG/ML IJ SOLN: 6.2500 mg | INTRAMUSCULAR | Status: DC | PRN

## 2017-10-04 MED ORDER — ONDANSETRON HCL 4 MG/2ML IJ SOLN
INTRAMUSCULAR | Status: DC | PRN
  Administered 2017-10-04: 4 mg via INTRAVENOUS

## 2017-10-04 MED ORDER — CHLORHEXIDINE GLUCONATE 4 % EX LIQD: 60.0000 mL | Freq: Once | CUTANEOUS | Status: DC

## 2017-10-04 MED ORDER — HEPARIN SODIUM (PORCINE) 1000 UNIT/ML IJ SOLN
INTRAMUSCULAR | Status: DC | PRN
  Administered 2017-10-04: 6000 [IU] via INTRAVENOUS

## 2017-10-04 MED ORDER — THROMBIN 20000 UNITS EX SOLR
CUTANEOUS | Status: AC
  Filled 2017-10-04: qty 20000

## 2017-10-04 MED ORDER — PHENYLEPHRINE HCL 10 MG/ML IJ SOLN
INTRAMUSCULAR | Status: DC | PRN
  Administered 2017-10-04: 80 ug via INTRAVENOUS
  Administered 2017-10-04: 120 ug via INTRAVENOUS
  Administered 2017-10-04 (×3): 80 ug via INTRAVENOUS

## 2017-10-04 MED ORDER — PROTAMINE SULFATE 10 MG/ML IV SOLN
INTRAVENOUS | Status: AC
  Filled 2017-10-04: qty 5

## 2017-10-04 MED ORDER — SODIUM CHLORIDE 0.9 % IV SOLN
INTRAVENOUS | Status: DC
  Administered 2017-10-04 (×3): via INTRAVENOUS

## 2017-10-04 MED ORDER — HYDROMORPHONE HCL 1 MG/ML IJ SOLN: 0.2500 mg | INTRAMUSCULAR | Status: DC | PRN

## 2017-10-04 MED ORDER — FENTANYL CITRATE (PF) 250 MCG/5ML IJ SOLN
INTRAMUSCULAR | Status: AC
  Filled 2017-10-04: qty 5

## 2017-10-04 MED ORDER — OXYCODONE HCL 5 MG PO TABS: 5.0000 mg | ORAL_TABLET | ORAL | 0 refills | Status: DC | PRN

## 2017-10-04 MED ORDER — PROPOFOL 500 MG/50ML IV EMUL
INTRAVENOUS | Status: DC | PRN
  Administered 2017-10-04: 75 ug/kg/min via INTRAVENOUS

## 2017-10-04 MED ORDER — FENTANYL CITRATE (PF) 100 MCG/2ML IJ SOLN
INTRAMUSCULAR | Status: DC | PRN
  Administered 2017-10-04 (×2): 25 ug via INTRAVENOUS

## 2017-10-04 MED ORDER — PROTAMINE SULFATE 10 MG/ML IV SOLN
INTRAVENOUS | Status: DC | PRN
Start: 2017-10-04 — End: 2017-10-04
  Administered 2017-10-04 (×3): 10 mg via INTRAVENOUS

## 2017-10-04 MED ORDER — DEXTROSE 5 % IV SOLN
INTRAVENOUS | Status: DC | PRN
  Administered 2017-10-04: 60 ug/min via INTRAVENOUS

## 2017-10-04 MED ORDER — ALBUMIN HUMAN 5 % IV SOLN
INTRAVENOUS | Status: DC | PRN
  Administered 2017-10-04: 12:00:00 via INTRAVENOUS

## 2017-10-04 MED ORDER — HEPARIN SODIUM (PORCINE) 5000 UNIT/ML IJ SOLN
INTRAMUSCULAR | Status: DC | PRN
  Administered 2017-10-04: 11:00:00

## 2017-10-04 MED ORDER — LIDOCAINE HCL (PF) 1 % IJ SOLN
INTRAMUSCULAR | Status: AC
  Filled 2017-10-04: qty 30

## 2017-10-04 MED ORDER — PROPOFOL 10 MG/ML IV BOLUS
INTRAVENOUS | Status: AC
  Filled 2017-10-04: qty 20

## 2017-10-04 MED ORDER — HEPARIN SODIUM (PORCINE) 1000 UNIT/ML IJ SOLN
INTRAMUSCULAR | Status: AC
  Filled 2017-10-04: qty 1

## 2017-10-04 MED ORDER — CEFAZOLIN SODIUM-DEXTROSE 2-4 GM/100ML-% IV SOLN
2.0000 g | INTRAVENOUS | Status: AC
  Administered 2017-10-04: 2 g via INTRAVENOUS

## 2017-10-04 MED ORDER — CEFAZOLIN SODIUM-DEXTROSE 2-4 GM/100ML-% IV SOLN
INTRAVENOUS | Status: AC
  Filled 2017-10-04: qty 100

## 2017-10-04 MED ORDER — PROPOFOL 10 MG/ML IV BOLUS
INTRAVENOUS | Status: DC | PRN
  Administered 2017-10-04: 20 mg via INTRAVENOUS

## 2017-10-04 MED ORDER — ONDANSETRON HCL 4 MG/2ML IJ SOLN: 4.0000 mg | Freq: Once | INTRAMUSCULAR | Status: DC | PRN

## 2017-10-04 MED ORDER — 0.9 % SODIUM CHLORIDE (POUR BTL) OPTIME
TOPICAL | Status: DC | PRN
  Administered 2017-10-04: 1000 mL

## 2017-10-04 MED ORDER — LIDOCAINE HCL (CARDIAC) 20 MG/ML IV SOLN
INTRAVENOUS | Status: DC | PRN
  Administered 2017-10-04: 80 mg via INTRAVENOUS

## 2017-10-04 MED ORDER — LIDOCAINE-EPINEPHRINE (PF) 1 %-1:200000 IJ SOLN
INTRAMUSCULAR | Status: DC | PRN
  Administered 2017-10-04: 28 mL

## 2017-10-04 MED ORDER — LIDOCAINE-EPINEPHRINE (PF) 1 %-1:200000 IJ SOLN
INTRAMUSCULAR | Status: AC
  Filled 2017-10-04: qty 30

## 2017-10-04 SURGICAL SUPPLY — 40 items
ARMBAND PINK RESTRICT EXTREMIT (MISCELLANEOUS) ×3 IMPLANT
BLADE SURG 15 STRL LF DISP TIS (BLADE) ×1 IMPLANT
BLADE SURG 15 STRL SS (BLADE) ×2
CANISTER SUCT 3000ML PPV (MISCELLANEOUS) ×3 IMPLANT
CANNULA VESSEL 3MM 2 BLNT TIP (CANNULA) ×3 IMPLANT
CLIP VESOCCLUDE MED 6/CT (CLIP) ×3 IMPLANT
CLIP VESOCCLUDE SM WIDE 6/CT (CLIP) ×3 IMPLANT
DECANTER SPIKE VIAL GLASS SM (MISCELLANEOUS) IMPLANT
DERMABOND ADVANCED (GAUZE/BANDAGES/DRESSINGS) ×2
DERMABOND ADVANCED .7 DNX12 (GAUZE/BANDAGES/DRESSINGS) ×1 IMPLANT
ELECT REM PT RETURN 9FT ADLT (ELECTROSURGICAL) ×3
ELECTRODE REM PT RTRN 9FT ADLT (ELECTROSURGICAL) ×1 IMPLANT
GLOVE BIO SURGEON STRL SZ7.5 (GLOVE) ×3 IMPLANT
GLOVE BIOGEL PI IND STRL 6.5 (GLOVE) ×3 IMPLANT
GLOVE BIOGEL PI IND STRL 7.0 (GLOVE) ×2 IMPLANT
GLOVE BIOGEL PI IND STRL 8 (GLOVE) ×1 IMPLANT
GLOVE BIOGEL PI INDICATOR 6.5 (GLOVE) ×6
GLOVE BIOGEL PI INDICATOR 7.0 (GLOVE) ×4
GLOVE BIOGEL PI INDICATOR 8 (GLOVE) ×2
GLOVE SURG SS PI 6.5 STRL IVOR (GLOVE) ×6 IMPLANT
GLOVE SURG SS PI 7.0 STRL IVOR (GLOVE) ×3 IMPLANT
GOWN STRL REUS W/ TWL LRG LVL3 (GOWN DISPOSABLE) ×4 IMPLANT
GOWN STRL REUS W/ TWL XL LVL3 (GOWN DISPOSABLE) ×1 IMPLANT
GOWN STRL REUS W/TWL LRG LVL3 (GOWN DISPOSABLE) ×12
GOWN STRL REUS W/TWL XL LVL3 (GOWN DISPOSABLE) ×3
GRAFT GORETEX STRT 4-7X45 (Vascular Products) ×3 IMPLANT
KIT BASIN OR (CUSTOM PROCEDURE TRAY) ×3 IMPLANT
KIT ROOM TURNOVER OR (KITS) ×3 IMPLANT
NS IRRIG 1000ML POUR BTL (IV SOLUTION) ×3 IMPLANT
PACK CV ACCESS (CUSTOM PROCEDURE TRAY) ×3 IMPLANT
PAD ARMBOARD 7.5X6 YLW CONV (MISCELLANEOUS) ×6 IMPLANT
SPONGE SURGIFOAM ABS GEL 100 (HEMOSTASIS) IMPLANT
SUT PROLENE 6 0 BV (SUTURE) ×15 IMPLANT
SUT VIC AB 3-0 SH 27 (SUTURE) ×4
SUT VIC AB 3-0 SH 27X BRD (SUTURE) ×2 IMPLANT
SUT VICRYL 4-0 PS2 18IN ABS (SUTURE) ×6 IMPLANT
SYR TOOMEY 50ML (SYRINGE) IMPLANT
TOWEL GREEN STERILE (TOWEL DISPOSABLE) ×3 IMPLANT
UNDERPAD 30X30 (UNDERPADS AND DIAPERS) ×3 IMPLANT
WATER STERILE IRR 1000ML POUR (IV SOLUTION) ×3 IMPLANT

## 2017-10-04 NOTE — Anesthesia Preprocedure Evaluation (Signed)
Anesthesia Evaluation  Patient identified by MRN, date of birth, ID band Patient awake    Reviewed: Allergy & Precautions, NPO status , Patient's Chart, lab work & pertinent test results  Airway Mallampati: I  TM Distance: >3 FB Neck ROM: Full    Dental   Pulmonary    Pulmonary exam normal        Cardiovascular hypertension, Pt. on medications Normal cardiovascular exam     Neuro/Psych    GI/Hepatic   Endo/Other  diabetes, Type 2, Oral Hypoglycemic Agents  Renal/GU ESRF and DialysisRenal disease     Musculoskeletal   Abdominal   Peds  Hematology   Anesthesia Other Findings   Reproductive/Obstetrics                             Anesthesia Physical Anesthesia Plan  ASA: III  Anesthesia Plan: MAC   Post-op Pain Management:    Induction: Intravenous  PONV Risk Score and Plan: 2 and Treatment may vary due to age or medical condition  Airway Management Planned: Simple Face Mask  Additional Equipment:   Intra-op Plan:   Post-operative Plan: Extubation in OR  Informed Consent: I have reviewed the patients History and Physical, chart, labs and discussed the procedure including the risks, benefits and alternatives for the proposed anesthesia with the patient or authorized representative who has indicated his/her understanding and acceptance.     Plan Discussed with: CRNA and Surgeon  Anesthesia Plan Comments:         Anesthesia Quick Evaluation

## 2017-10-04 NOTE — Transfer of Care (Signed)
Immediate Anesthesia Transfer of Care Note  Patient: Jackie [name]  Procedure(s) Performed: INSERTION OF ARTERIOVENOUS (AV) GORE-TEX GRAFT LEFT UPPER ARM (Left )  Patient Location: PACU  Anesthesia Type:MAC  Level of Consciousness: awake and alert   Airway & Oxygen Therapy: Patient Spontanous Breathing and Patient connected to face mask oxygen  Post-op Assessment: Report given to RN and Post -op Vital signs reviewed and stable  Post vital signs: Reviewed and stable  Last Vitals:  Vitals:   10/04/17 0805 10/04/17 1308  BP: (!) 151/77 (!) 162/80  Pulse:    Resp:  (!) 21  Temp:    SpO2:      Last Pain: There were no vitals filed for this visit.    Patients Stated Pain Goal: 3 (76/[id number]/41 [id number])  Complications: No apparent anesthesia complications

## 2017-10-04 NOTE — Anesthesia Postprocedure Evaluation (Signed)
Anesthesia Post Note  Patient: Jackie [name]  Procedure(s) Performed: INSERTION OF ARTERIOVENOUS (AV) GORE-TEX GRAFT LEFT UPPER ARM (Left )     Patient location during evaluation: PACU Anesthesia Type: MAC Level of consciousness: awake and alert Pain management: pain level controlled Vital Signs Assessment: post-procedure vital signs reviewed and stable Respiratory status: spontaneous breathing, nonlabored ventilation, respiratory function stable and patient connected to nasal cannula oxygen Cardiovascular status: stable and blood pressure returned to baseline Postop Assessment: no apparent nausea or vomiting Anesthetic complications: no    Last Vitals:  Vitals:   10/04/17 1323 10/04/17 1338  BP: (!) 152/93 (!) 148/83  Pulse: 88 90  Resp: 14 13  Temp:    SpO2: 95% 93%    Last Pain:  Vitals:   10/04/17 1338  PainSc: 0-No pain                 Pola Furno DAVID

## 2017-10-04 NOTE — H&P (Signed)
REASON FOR VISIT:    Evaluate for hemodialysis access.  The consult is requested by Dr. Paulene Floor.  HPI:   Jackie [name] is a pleasant 83 y.o. female, who presents for new hemodialysis access.  On 07/29/2017 she had presented with a thrombosed right brachiocephalic fistula.  This is undergone multiple interventions and ultimately this could not be salvaged.  The patient had a tunneled dialysis catheter placed at that time by [facility].  Patient is referred for new hemodialysis access.  She dialyzes on Tuesdays Thursdays and Saturdays.  She has a left IJ tunneled dialysis catheter which has been working well.  She is previously had a left upper arm graft placed.  She is right-handed and would prefer to have access in the left arm.  She denies any recent uremic symptoms.  Specifically she denies nausea, vomiting, fatigue, anorexia, or palpitations.      Past Medical History:  Diagnosis Date  . Anemia   . Arthritis   . Diabetes mellitus   . End-stage renal disease on hemodialysis (HCC)    dialysis T/Th/Sa  . Hyperlipidemia   . Hypertension   . Pneumonia   . Thyroid disease    pt denies this         Family History  Problem Relation Age of Onset  . Diabetes Mother   . Heart disease Mother   . Hypertension Mother     SOCIAL HISTORY: Social History        Socioeconomic History  . Marital status: Widowed    Spouse name: Not on file  . Number of children: Not on file  . Years of education: Not on file  . Highest education level: Not on file  Social Needs  . Financial resource strain: Not on file  . Food insecurity - worry: Not on file  . Food insecurity - inability: Not on file  . Transportation needs - medical: Not on file  . Transportation needs - non-medical: Not on file  Occupational History  . Not on file  Tobacco Use  . Smoking status: Never Smoker  . Smokeless tobacco: Never Used  Substance and Sexual Activity  . Alcohol use:  No  . Drug use: No  . Sexual activity: Not on file  Other Topics Concern  . Not on file  Social History Narrative  . Not on file    No Known Allergies        Current Outpatient Medications  Medication Sig Dispense Refill  . albuterol (PROVENTIL HFA;VENTOLIN HFA) 108 (90 BASE) MCG/ACT inhaler Inhale 1-2 puffs into the lungs every 6 (six) hours as needed for wheezing. 1 Inhaler 0  . calcium acetate (PHOSLO) 667 MG capsule Take 2 capsules by mouth 3 (three) times daily with meals.    . cefTAZidime 2 g in dextrose 5 % 50 mL Inject 2 g into the vein Every Tuesday,Thursday,and Saturday with dialysis.    . cephALEXin (KEFLEX) 500 MG capsule Take 1 capsule (500 mg total) by mouth daily. On the day of your dialysis please take the Keflex after the dialysis session 7 capsule 0  . cinacalcet (SENSIPAR) 30 MG tablet Take 30 mg by mouth daily.    . clopidogrel (PLAVIX) 75 MG tablet TAKE ONE TABLET   BY MOUTH   DAILY 30 tablet 9  . docusate sodium (COLACE) 100 MG capsule Reported on 09/09/2015    . doxycycline (VIBRAMYCIN) 100 MG capsule Take 1 capsule (100 mg total) by mouth 2 (two) times daily.  14 capsule 0  . FOSRENOL 1000 MG chewable tablet Chew 2,000 mg by mouth 3 (three) times daily with meals.     . insulin detemir (LEVEMIR) 100 UNIT/ML injection Inject 0.1 mLs (10 Units total) into the skin at bedtime. (Patient taking differently: Inject 0.65ml (10 units) into skin at bedtime to control blood sugar) 10 mL 11  . oxyCODONE-acetaminophen (ROXICET) 5-325 MG tablet Take 1 tablet by mouth every 4 (four) hours as needed for severe pain. 60 tablet 0  . promethazine (PHENERGAN) 12.5 MG tablet Take 1 tablet by mouth daily as needed.    . Protein (PROCEL PO) Two scoops twice daily    . saccharomyces boulardii (FLORASTOR) 250 MG capsule Take 1 capsule (250 mg total) by mouth 2 (two) times daily. 30 capsule 0  . warfarin (COUMADIN) 5 MG tablet Take 1 tablet (5 mg total) by mouth daily. 30  tablet 1   No current facility-administered medications for this visit.     REVIEW OF SYSTEMS:  [X]  denotes positive finding, [ ]  denotes negative finding Cardiac  Comments:  Chest pain or chest pressure:    Shortness of breath upon exertion:    Short of breath when lying flat:    Irregular heart rhythm:        Vascular    Pain in calf, thigh, or hip brought on by ambulation:    Pain in feet at night that wakes you up from your sleep:     Blood clot in your veins:    Leg swelling:         Pulmonary    Oxygen at home:    Productive cough:     Wheezing:         Neurologic    Sudden weakness in arms or legs:     Sudden numbness in arms or legs:     Sudden onset of difficulty speaking or slurred speech:    Temporary loss of vision in one eye:     Problems with dizziness:         Gastrointestinal    Blood in stool:     Vomited blood:         Genitourinary    Burning when urinating:     Blood in urine:        Psychiatric    Major depression:         Hematologic    Bleeding problems:    Problems with blood clotting too easily:        Skin    Rashes or ulcers:        Constitutional    Fever or chills:     PHYSICAL EXAM:       Vitals:   09/01/17 1225 09/01/17 1228  BP: (!) 151/102 (!) 146/99  Pulse: 98   Resp: 18   Temp: (!) 97 F (36.1 C)   TempSrc: Oral   SpO2: 98%   Weight: 130 lb (59 kg)   Height: 5\' 9"  (1.753 m)     GENERAL: The patient is a well-nourished female, in no acute distress. The vital signs are documented above. CARDIAC: There is a regular rate and rhythm.  VASCULAR: I do not detect carotid bruits. She has a palpable radial pulse bilaterally. She has no upper extremity swelling. PULMONARY: There is good air exchange bilaterally without wheezing or rales. MUSCULOSKELETAL: She has a left below the knee amputation. NEUROLOGIC: No  focal weakness or paresthesias are detected. SKIN: She had an eschar over  the midsection of her right upper arm fistula which I removed.  Beneath that there is a small wound but no exposed fistula. PSYCHIATRIC: The patient has a normal affect.  DATA:    BILATERAL UPPER EXTREMITY VEIN MAPPING: I have independently interpreted the bilateral upper extremity vein map.  On the right side the cephalic vein is not adequate for a fistula.  The upper arm cephalic vein cannot be visualized.  The basilic vein is not adequate on the right either.  On the left side the forearm and upper arm cephalic vein are very small and not adequate for a fistula.  The basilic vein is not adequate.  UPPER EXTREMITY ARTERIAL DUPLEX: I have independently interpreted the upper extremity arterial duplex scan.  There are triphasic Doppler signals in the radial and ulnar positions bilaterally.  The right brachial artery measures 0.38 cm in diameter.  The left brachial artery measures 0.46 cm in diameter.  VENOGRAM: Her venogram on 09/17/2017 showed that the central veins on the right were occluded.  The central veins on the left were patent.  MEDICAL ISSUES:   END-STAGE RENAL DISEASE: Her next options for access are a right upper arm graft for a left upper arm graft.  She is previously had an upper arm graft on the left but it looks like there might be some room to get above this incision.  I would like to rule out a central venous stenosis however before scheduling access.    Based on the central venogram we plan placement of a left upper arm AV graft.  Waverly Ferrarihristopher Dickson

## 2017-10-04 NOTE — Op Note (Signed)
    NAME: Jackie [name]    MRN: [id number] DOB: 11/29/1934    DATE OF OPERATION: 10/04/2017  PREOP DIAGNOSIS:    End-stage renal disease  POSTOP DIAGNOSIS:    Same  PROCEDURE:    Redo left upper arm AV graft  SURGEON: Di Kindlehristopher S. Edilia Boickson, MD, FACS  ASSIST: Silvestre GunnerStacey Perkins RNFA  ANESTHESIA:  General  EBL: Minimal  INDICATIONS:    Jackie HairBetty L Wickizer is a 83 y.o. female who is had upper arm grafts in both arms.  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  She underwent a central venogram which showed that the central veins on the left were patent and I felt before going to the thigh it was worth attempting a redo upper arm graft on the left.  4 mm  FINDINGS:   Upper arm brachial vein.  The vein was quite thin.  TECHNIQUE:   The patient was taken to the operating room and received sedation.  The left upper extremity was prepped and draped in usual sterile fashion.  The previous incision at the antecubital level was opened and I dissected free the brachial artery below the previous anastomosis.  This was controlled with a vessel loop.  Next incision was made in the axilla above the previous incision and here the high brachial vein was dissected free.  This was above the previous anastomosis.  The vein was quite thin but felt to be adequate.  A 4-7 mm PTFE graft was tunneled between the 2 incisions and the patient heparinized.  The brachial artery was clamped proximally and distally and a longitudinal arteriotomy was made.  A short segment of the 4 mm end of the graft was excised, the graft slightly spatulated and sewn end to side to the artery using continuous 6-0 Prolene suture.  The graft was then pulled the appropriate length for anastomosis to the high brachial vein.  The vein was ligated distally and spatulated proximally.  The graft was cut to the appropriate length, spatulated and sewn end to end to the vein using 2 continuous 6-0 Prolene sutures.  At the completion was a  good thrill in the graft and a radial and ulnar signal with the Doppler.  The heparin was partially reversed with protamine.  The wounds were each closed with a deep layer of 3-0 Vicryl and the skin closed with 4-0 Vicryl.  Dermabond was applied.  The patient tolerated the procedure well and was transferred to the recovery room in stable condition.  All needle and sponge counts were correct.  [name], MD, FACS [facility]  DATE OF DICTATION:   10/04/2017

## 2017-10-05 ENCOUNTER — Encounter (HOSPITAL_COMMUNITY): Payer: Self-pay | Admitting: Vascular Surgery

## 2017-10-06 DIAGNOSIS — I120 Hypertensive chronic kidney disease with stage 5 chronic kidney disease or end stage renal disease: Secondary | ICD-10-CM | POA: Diagnosis not present

## 2017-10-06 DIAGNOSIS — Z89512 Acquired absence of left leg below knee: Secondary | ICD-10-CM | POA: Diagnosis not present

## 2017-10-06 DIAGNOSIS — E1122 Type 2 diabetes mellitus with diabetic chronic kidney disease: Secondary | ICD-10-CM | POA: Diagnosis not present

## 2017-10-06 DIAGNOSIS — Z992 Dependence on renal dialysis: Secondary | ICD-10-CM | POA: Diagnosis not present

## 2017-10-06 DIAGNOSIS — T82511D Breakdown (mechanical) of surgically created arteriovenous shunt, subsequent encounter: Secondary | ICD-10-CM | POA: Diagnosis not present

## 2017-10-06 DIAGNOSIS — N186 End stage renal disease: Secondary | ICD-10-CM | POA: Diagnosis not present

## 2017-10-07 ENCOUNTER — Telehealth: Payer: Self-pay | Admitting: Vascular Surgery

## 2017-10-07 ENCOUNTER — Telehealth: Payer: Self-pay | Admitting: *Deleted

## 2017-10-07 DIAGNOSIS — N186 End stage renal disease: Secondary | ICD-10-CM | POA: Diagnosis not present

## 2017-10-07 DIAGNOSIS — D631 Anemia in chronic kidney disease: Secondary | ICD-10-CM | POA: Diagnosis not present

## 2017-10-07 DIAGNOSIS — E1129 Type 2 diabetes mellitus with other diabetic kidney complication: Secondary | ICD-10-CM | POA: Diagnosis not present

## 2017-10-07 DIAGNOSIS — N2581 Secondary hyperparathyroidism of renal origin: Secondary | ICD-10-CM | POA: Diagnosis not present

## 2017-10-07 DIAGNOSIS — D509 Iron deficiency anemia, unspecified: Secondary | ICD-10-CM | POA: Diagnosis not present

## 2017-10-07 NOTE — Telephone Encounter (Signed)
Unable to leave pt vm regarding appt. No vm set up. Mailed letter

## 2017-10-07 NOTE — Telephone Encounter (Signed)
Junious DresserConnie with [facility] called today and left a message that Mrs. Donaghue's rt foot was cool to the touch. Junious DresserConnie stated that pt had 2+ edema and she was unable to plapate pedal pulses. Pt is s/p insertion of left upper arm AVG by Dr. Edilia Boickson on 10-04-17. She also has a history of a.fib (Dr.Hilty) and has had left BKA by Dr. Lajoyce Cornersuda in 2016.  I tried to call Junious DresserConnie back at [phone] but [facility] I tried to contact Mrs [name] but there was no answer at her number X2.  I left message for Junious DresserConnie to get the patient to [place] asap for evaluation of possible blood clot formation.

## 2017-10-07 NOTE — Telephone Encounter (Signed)
-----   Message from Sharee PimpleMarilyn K McChesney, RN sent at 10/04/2017  1:42 PM EDT ----- Regarding: 2 weeks postop AVG   ----- Message ----- From: Chuck Hintickson, Christopher S, MD Sent: 10/04/2017  12:57 PM To: [facility] Charge Pool Subject: charge                                          PROCEDURE:   Redo left upper arm AV graft  SURGEON: Di Kindlehristopher S. Edilia Boickson, MD, FACS  ASSIST: Silvestre GunnerStacey Perkins RNFA  This patient should be seen in follow-up for a wound check in 2 weeks on the PA schedule.  Thank you.

## 2017-10-09 DIAGNOSIS — D631 Anemia in chronic kidney disease: Secondary | ICD-10-CM | POA: Diagnosis not present

## 2017-10-09 DIAGNOSIS — N2581 Secondary hyperparathyroidism of renal origin: Secondary | ICD-10-CM | POA: Diagnosis not present

## 2017-10-09 DIAGNOSIS — N186 End stage renal disease: Secondary | ICD-10-CM | POA: Diagnosis not present

## 2017-10-09 DIAGNOSIS — E1129 Type 2 diabetes mellitus with other diabetic kidney complication: Secondary | ICD-10-CM | POA: Diagnosis not present

## 2017-10-09 DIAGNOSIS — D509 Iron deficiency anemia, unspecified: Secondary | ICD-10-CM | POA: Diagnosis not present

## 2017-10-11 ENCOUNTER — Ambulatory Visit (INDEPENDENT_AMBULATORY_CARE_PROVIDER_SITE_OTHER): Payer: Medicare Other | Admitting: Pharmacist Clinician (PhC)/ Clinical Pharmacy Specialist

## 2017-10-11 DIAGNOSIS — Z7901 Long term (current) use of anticoagulants: Secondary | ICD-10-CM

## 2017-10-11 DIAGNOSIS — I4891 Unspecified atrial fibrillation: Secondary | ICD-10-CM | POA: Diagnosis not present

## 2017-10-11 LAB — POCT INR: INR: 1.2

## 2017-10-11 NOTE — Patient Instructions (Signed)
Description   Take 2 tablets Monday March 25, then 1. 5 tablets Tuesday March 26.  Then continue with your normal schedule, 1.5 tablets each Monday, Wednesday and Friday, 1 tablet all other days.  Repeat INR in 2 weeks

## 2017-10-12 DIAGNOSIS — N2581 Secondary hyperparathyroidism of renal origin: Secondary | ICD-10-CM | POA: Diagnosis not present

## 2017-10-12 DIAGNOSIS — E1129 Type 2 diabetes mellitus with other diabetic kidney complication: Secondary | ICD-10-CM | POA: Diagnosis not present

## 2017-10-12 DIAGNOSIS — D509 Iron deficiency anemia, unspecified: Secondary | ICD-10-CM | POA: Diagnosis not present

## 2017-10-12 DIAGNOSIS — N186 End stage renal disease: Secondary | ICD-10-CM | POA: Diagnosis not present

## 2017-10-12 DIAGNOSIS — D631 Anemia in chronic kidney disease: Secondary | ICD-10-CM | POA: Diagnosis not present

## 2017-10-14 DIAGNOSIS — N186 End stage renal disease: Secondary | ICD-10-CM | POA: Diagnosis not present

## 2017-10-14 DIAGNOSIS — D509 Iron deficiency anemia, unspecified: Secondary | ICD-10-CM | POA: Diagnosis not present

## 2017-10-14 DIAGNOSIS — D631 Anemia in chronic kidney disease: Secondary | ICD-10-CM | POA: Diagnosis not present

## 2017-10-14 DIAGNOSIS — N2581 Secondary hyperparathyroidism of renal origin: Secondary | ICD-10-CM | POA: Diagnosis not present

## 2017-10-14 DIAGNOSIS — E1129 Type 2 diabetes mellitus with other diabetic kidney complication: Secondary | ICD-10-CM | POA: Diagnosis not present

## 2017-10-16 DIAGNOSIS — D631 Anemia in chronic kidney disease: Secondary | ICD-10-CM | POA: Diagnosis not present

## 2017-10-16 DIAGNOSIS — E1129 Type 2 diabetes mellitus with other diabetic kidney complication: Secondary | ICD-10-CM | POA: Diagnosis not present

## 2017-10-16 DIAGNOSIS — N186 End stage renal disease: Secondary | ICD-10-CM | POA: Diagnosis not present

## 2017-10-16 DIAGNOSIS — D509 Iron deficiency anemia, unspecified: Secondary | ICD-10-CM | POA: Diagnosis not present

## 2017-10-16 DIAGNOSIS — N2581 Secondary hyperparathyroidism of renal origin: Secondary | ICD-10-CM | POA: Diagnosis not present

## 2017-10-18 ENCOUNTER — Ambulatory Visit: Payer: Medicare Other | Admitting: Adult Health

## 2017-10-18 DIAGNOSIS — N186 End stage renal disease: Secondary | ICD-10-CM | POA: Diagnosis not present

## 2017-10-18 DIAGNOSIS — E1129 Type 2 diabetes mellitus with other diabetic kidney complication: Secondary | ICD-10-CM | POA: Diagnosis not present

## 2017-10-18 DIAGNOSIS — Z992 Dependence on renal dialysis: Secondary | ICD-10-CM | POA: Diagnosis not present

## 2017-10-19 DIAGNOSIS — N186 End stage renal disease: Secondary | ICD-10-CM | POA: Diagnosis not present

## 2017-10-19 DIAGNOSIS — D631 Anemia in chronic kidney disease: Secondary | ICD-10-CM | POA: Diagnosis not present

## 2017-10-19 DIAGNOSIS — N2581 Secondary hyperparathyroidism of renal origin: Secondary | ICD-10-CM | POA: Diagnosis not present

## 2017-10-19 DIAGNOSIS — E1129 Type 2 diabetes mellitus with other diabetic kidney complication: Secondary | ICD-10-CM | POA: Diagnosis not present

## 2017-10-19 DIAGNOSIS — D509 Iron deficiency anemia, unspecified: Secondary | ICD-10-CM | POA: Diagnosis not present

## 2017-10-19 DIAGNOSIS — L03115 Cellulitis of right lower limb: Secondary | ICD-10-CM | POA: Diagnosis not present

## 2017-10-20 ENCOUNTER — Ambulatory Visit (INDEPENDENT_AMBULATORY_CARE_PROVIDER_SITE_OTHER): Payer: Self-pay | Admitting: Physician Assistant

## 2017-10-20 VITALS — BP 149/75 | HR 92 | Temp 98.1°F | Resp 18 | Ht 69.0 in | Wt 132.0 lb

## 2017-10-20 DIAGNOSIS — N186 End stage renal disease: Secondary | ICD-10-CM

## 2017-10-20 DIAGNOSIS — Z992 Dependence on renal dialysis: Secondary | ICD-10-CM

## 2017-10-20 NOTE — Progress Notes (Signed)
    Postoperative Access Visit   History of Present Illness   Jackie [name] is a 83 y.o. year old female who presents for postoperative follow-up for: left upper arm arteriovenous graft (Date: 10/04/17).  The patient's wounds are well healed.  The patient denies any steal symptoms.  The patient is able to complete their activities of daily living.  Surgical history also significant for several dialysis access surgeries on her right arm that have since failed.  Surgical history also significant for left BKA by Dr. Lajoyce Corners.  Patient is ambulatory with a walker.  She denies rest pain or active tissue ischemia on right lower extremity.  She is taking coumadin for atrial fibrillation.   Physical Examination   Vitals:   10/20/17 1304  BP: (!) 149/75  Pulse: 92  Resp: 18  Temp: 98.1 F (36.7 C)  TempSrc: Oral  SpO2: 100%  Weight: 132 lb (59.9 kg)  Height:  (1.753 m)   Body mass index is 19.49 kg/m.  left arm Incision is healed, skin feels soft, hand grip is 5/5, sensation in digits is intact, palpable thrill, bruit can be auscultated     Medical Decision Making   Jackie [name] is a 83 y.o. year old female who presents s/p left upper arm arteriovenous graft   The patient's access is patent and will be ready for use in 2 more weeks.  The patient's tunneled dialysis catheter can be removed after two successful cannulations and completed dialysis treatments.  If left upper arm AV graft fails will most likely require a thigh graft.  Thank you for allowing [place] to participate in this patient's care.   Emilie Rutter, PA-C [facility] Office: [phone]

## 2017-10-21 DIAGNOSIS — D631 Anemia in chronic kidney disease: Secondary | ICD-10-CM | POA: Diagnosis not present

## 2017-10-21 DIAGNOSIS — N2581 Secondary hyperparathyroidism of renal origin: Secondary | ICD-10-CM | POA: Diagnosis not present

## 2017-10-21 DIAGNOSIS — D509 Iron deficiency anemia, unspecified: Secondary | ICD-10-CM | POA: Diagnosis not present

## 2017-10-21 DIAGNOSIS — N186 End stage renal disease: Secondary | ICD-10-CM | POA: Diagnosis not present

## 2017-10-21 DIAGNOSIS — E1129 Type 2 diabetes mellitus with other diabetic kidney complication: Secondary | ICD-10-CM | POA: Diagnosis not present

## 2017-10-21 DIAGNOSIS — L03115 Cellulitis of right lower limb: Secondary | ICD-10-CM | POA: Diagnosis not present

## 2017-10-22 ENCOUNTER — Telehealth: Payer: Self-pay | Admitting: *Deleted

## 2017-10-22 NOTE — Telephone Encounter (Signed)
Received call from Lyla Sonarrie, nurse with [facility]. She states that upon multiple times she has been trying to visit patient but patient has not been at home.  Upon trying to reach her by phone multiple times,there is no room on her voice mail.

## 2017-10-23 DIAGNOSIS — N2581 Secondary hyperparathyroidism of renal origin: Secondary | ICD-10-CM | POA: Diagnosis not present

## 2017-10-23 DIAGNOSIS — L03115 Cellulitis of right lower limb: Secondary | ICD-10-CM | POA: Diagnosis not present

## 2017-10-23 DIAGNOSIS — E1129 Type 2 diabetes mellitus with other diabetic kidney complication: Secondary | ICD-10-CM | POA: Diagnosis not present

## 2017-10-23 DIAGNOSIS — D509 Iron deficiency anemia, unspecified: Secondary | ICD-10-CM | POA: Diagnosis not present

## 2017-10-23 DIAGNOSIS — N186 End stage renal disease: Secondary | ICD-10-CM | POA: Diagnosis not present

## 2017-10-23 DIAGNOSIS — D631 Anemia in chronic kidney disease: Secondary | ICD-10-CM | POA: Diagnosis not present

## 2017-10-25 ENCOUNTER — Other Ambulatory Visit: Payer: Self-pay | Admitting: Pharmacist Clinician (PhC)/ Clinical Pharmacy Specialist

## 2017-10-25 ENCOUNTER — Ambulatory Visit (INDEPENDENT_AMBULATORY_CARE_PROVIDER_SITE_OTHER): Payer: Medicare Other | Admitting: Pharmacist Clinician (PhC)/ Clinical Pharmacy Specialist

## 2017-10-25 DIAGNOSIS — Z7901 Long term (current) use of anticoagulants: Secondary | ICD-10-CM | POA: Diagnosis not present

## 2017-10-25 DIAGNOSIS — I4891 Unspecified atrial fibrillation: Secondary | ICD-10-CM | POA: Diagnosis not present

## 2017-10-25 LAB — POCT INR: INR: 1.6

## 2017-10-25 NOTE — Patient Instructions (Signed)
Description   Take 2 tablets Monday April 8, then increase dose to 1.5 tablets daily and 1 tablet each Sunday, Tuesday and Thursday.   Repeat INR in 2 weeks

## 2017-10-26 DIAGNOSIS — D509 Iron deficiency anemia, unspecified: Secondary | ICD-10-CM | POA: Diagnosis not present

## 2017-10-26 DIAGNOSIS — D631 Anemia in chronic kidney disease: Secondary | ICD-10-CM | POA: Diagnosis not present

## 2017-10-26 DIAGNOSIS — N186 End stage renal disease: Secondary | ICD-10-CM | POA: Diagnosis not present

## 2017-10-26 DIAGNOSIS — N2581 Secondary hyperparathyroidism of renal origin: Secondary | ICD-10-CM | POA: Diagnosis not present

## 2017-10-26 DIAGNOSIS — L03115 Cellulitis of right lower limb: Secondary | ICD-10-CM | POA: Diagnosis not present

## 2017-10-26 DIAGNOSIS — E1129 Type 2 diabetes mellitus with other diabetic kidney complication: Secondary | ICD-10-CM | POA: Diagnosis not present

## 2017-10-28 DIAGNOSIS — D631 Anemia in chronic kidney disease: Secondary | ICD-10-CM | POA: Diagnosis not present

## 2017-10-28 DIAGNOSIS — N2581 Secondary hyperparathyroidism of renal origin: Secondary | ICD-10-CM | POA: Diagnosis not present

## 2017-10-28 DIAGNOSIS — N186 End stage renal disease: Secondary | ICD-10-CM | POA: Diagnosis not present

## 2017-10-28 DIAGNOSIS — D509 Iron deficiency anemia, unspecified: Secondary | ICD-10-CM | POA: Diagnosis not present

## 2017-10-28 DIAGNOSIS — E1129 Type 2 diabetes mellitus with other diabetic kidney complication: Secondary | ICD-10-CM | POA: Diagnosis not present

## 2017-10-28 DIAGNOSIS — L03115 Cellulitis of right lower limb: Secondary | ICD-10-CM | POA: Diagnosis not present

## 2017-10-30 DIAGNOSIS — L03115 Cellulitis of right lower limb: Secondary | ICD-10-CM | POA: Diagnosis not present

## 2017-10-30 DIAGNOSIS — D509 Iron deficiency anemia, unspecified: Secondary | ICD-10-CM | POA: Diagnosis not present

## 2017-10-30 DIAGNOSIS — N2581 Secondary hyperparathyroidism of renal origin: Secondary | ICD-10-CM | POA: Diagnosis not present

## 2017-10-30 DIAGNOSIS — N186 End stage renal disease: Secondary | ICD-10-CM | POA: Diagnosis not present

## 2017-10-30 DIAGNOSIS — D631 Anemia in chronic kidney disease: Secondary | ICD-10-CM | POA: Diagnosis not present

## 2017-10-30 DIAGNOSIS — E1129 Type 2 diabetes mellitus with other diabetic kidney complication: Secondary | ICD-10-CM | POA: Diagnosis not present

## 2017-11-02 DIAGNOSIS — D509 Iron deficiency anemia, unspecified: Secondary | ICD-10-CM | POA: Diagnosis not present

## 2017-11-02 DIAGNOSIS — E1129 Type 2 diabetes mellitus with other diabetic kidney complication: Secondary | ICD-10-CM | POA: Diagnosis not present

## 2017-11-02 DIAGNOSIS — N2581 Secondary hyperparathyroidism of renal origin: Secondary | ICD-10-CM | POA: Diagnosis not present

## 2017-11-02 DIAGNOSIS — D631 Anemia in chronic kidney disease: Secondary | ICD-10-CM | POA: Diagnosis not present

## 2017-11-02 DIAGNOSIS — L03115 Cellulitis of right lower limb: Secondary | ICD-10-CM | POA: Diagnosis not present

## 2017-11-02 DIAGNOSIS — N186 End stage renal disease: Secondary | ICD-10-CM | POA: Diagnosis not present

## 2017-11-03 DIAGNOSIS — Z992 Dependence on renal dialysis: Secondary | ICD-10-CM | POA: Diagnosis not present

## 2017-11-03 DIAGNOSIS — T82511D Breakdown (mechanical) of surgically created arteriovenous shunt, subsequent encounter: Secondary | ICD-10-CM | POA: Diagnosis not present

## 2017-11-03 DIAGNOSIS — Z89512 Acquired absence of left leg below knee: Secondary | ICD-10-CM | POA: Diagnosis not present

## 2017-11-03 DIAGNOSIS — N186 End stage renal disease: Secondary | ICD-10-CM | POA: Diagnosis not present

## 2017-11-03 DIAGNOSIS — E1122 Type 2 diabetes mellitus with diabetic chronic kidney disease: Secondary | ICD-10-CM | POA: Diagnosis not present

## 2017-11-03 DIAGNOSIS — I120 Hypertensive chronic kidney disease with stage 5 chronic kidney disease or end stage renal disease: Secondary | ICD-10-CM | POA: Diagnosis not present

## 2017-11-04 DIAGNOSIS — L03115 Cellulitis of right lower limb: Secondary | ICD-10-CM | POA: Diagnosis not present

## 2017-11-04 DIAGNOSIS — N2581 Secondary hyperparathyroidism of renal origin: Secondary | ICD-10-CM | POA: Diagnosis not present

## 2017-11-04 DIAGNOSIS — E1129 Type 2 diabetes mellitus with other diabetic kidney complication: Secondary | ICD-10-CM | POA: Diagnosis not present

## 2017-11-04 DIAGNOSIS — N186 End stage renal disease: Secondary | ICD-10-CM | POA: Diagnosis not present

## 2017-11-04 DIAGNOSIS — D631 Anemia in chronic kidney disease: Secondary | ICD-10-CM | POA: Diagnosis not present

## 2017-11-04 DIAGNOSIS — D509 Iron deficiency anemia, unspecified: Secondary | ICD-10-CM | POA: Diagnosis not present

## 2017-11-06 DIAGNOSIS — N2581 Secondary hyperparathyroidism of renal origin: Secondary | ICD-10-CM | POA: Diagnosis not present

## 2017-11-06 DIAGNOSIS — E1129 Type 2 diabetes mellitus with other diabetic kidney complication: Secondary | ICD-10-CM | POA: Diagnosis not present

## 2017-11-06 DIAGNOSIS — L03115 Cellulitis of right lower limb: Secondary | ICD-10-CM | POA: Diagnosis not present

## 2017-11-06 DIAGNOSIS — D631 Anemia in chronic kidney disease: Secondary | ICD-10-CM | POA: Diagnosis not present

## 2017-11-06 DIAGNOSIS — N186 End stage renal disease: Secondary | ICD-10-CM | POA: Diagnosis not present

## 2017-11-06 DIAGNOSIS — D509 Iron deficiency anemia, unspecified: Secondary | ICD-10-CM | POA: Diagnosis not present

## 2017-11-08 ENCOUNTER — Ambulatory Visit (INDEPENDENT_AMBULATORY_CARE_PROVIDER_SITE_OTHER): Payer: Medicare Other | Admitting: Pharmacist

## 2017-11-08 DIAGNOSIS — I4891 Unspecified atrial fibrillation: Secondary | ICD-10-CM | POA: Diagnosis not present

## 2017-11-08 DIAGNOSIS — Z7901 Long term (current) use of anticoagulants: Secondary | ICD-10-CM | POA: Diagnosis not present

## 2017-11-08 LAB — POCT INR: INR: 1.6

## 2017-11-08 NOTE — Patient Instructions (Signed)
Increase dose to 1.5 tablets daily and 1 tablet each Tuesday and Thursday.   Repeat INR in 2 weeks

## 2017-11-09 ENCOUNTER — Emergency Department (HOSPITAL_COMMUNITY): Payer: Medicare Other

## 2017-11-09 ENCOUNTER — Encounter (HOSPITAL_COMMUNITY): Payer: Self-pay

## 2017-11-09 DIAGNOSIS — Y929 Unspecified place or not applicable: Secondary | ICD-10-CM | POA: Diagnosis not present

## 2017-11-09 DIAGNOSIS — L03115 Cellulitis of right lower limb: Secondary | ICD-10-CM | POA: Diagnosis not present

## 2017-11-09 DIAGNOSIS — X58XXXA Exposure to other specified factors, initial encounter: Secondary | ICD-10-CM | POA: Insufficient documentation

## 2017-11-09 DIAGNOSIS — Z794 Long term (current) use of insulin: Secondary | ICD-10-CM | POA: Diagnosis not present

## 2017-11-09 DIAGNOSIS — N186 End stage renal disease: Secondary | ICD-10-CM | POA: Diagnosis not present

## 2017-11-09 DIAGNOSIS — Z992 Dependence on renal dialysis: Secondary | ICD-10-CM | POA: Diagnosis not present

## 2017-11-09 DIAGNOSIS — S90821A Blister (nonthermal), right foot, initial encounter: Secondary | ICD-10-CM | POA: Insufficient documentation

## 2017-11-09 DIAGNOSIS — I120 Hypertensive chronic kidney disease with stage 5 chronic kidney disease or end stage renal disease: Secondary | ICD-10-CM | POA: Insufficient documentation

## 2017-11-09 DIAGNOSIS — E1129 Type 2 diabetes mellitus with other diabetic kidney complication: Secondary | ICD-10-CM | POA: Diagnosis not present

## 2017-11-09 DIAGNOSIS — E119 Type 2 diabetes mellitus without complications: Secondary | ICD-10-CM | POA: Insufficient documentation

## 2017-11-09 DIAGNOSIS — D509 Iron deficiency anemia, unspecified: Secondary | ICD-10-CM | POA: Diagnosis not present

## 2017-11-09 DIAGNOSIS — Z79899 Other long term (current) drug therapy: Secondary | ICD-10-CM | POA: Diagnosis not present

## 2017-11-09 DIAGNOSIS — N179 Acute kidney failure, unspecified: Secondary | ICD-10-CM | POA: Diagnosis not present

## 2017-11-09 DIAGNOSIS — Y939 Activity, unspecified: Secondary | ICD-10-CM | POA: Diagnosis not present

## 2017-11-09 DIAGNOSIS — D631 Anemia in chronic kidney disease: Secondary | ICD-10-CM | POA: Diagnosis not present

## 2017-11-09 DIAGNOSIS — Y999 Unspecified external cause status: Secondary | ICD-10-CM | POA: Insufficient documentation

## 2017-11-09 DIAGNOSIS — L8992 Pressure ulcer of unspecified site, stage 2: Secondary | ICD-10-CM | POA: Diagnosis not present

## 2017-11-09 DIAGNOSIS — N2581 Secondary hyperparathyroidism of renal origin: Secondary | ICD-10-CM | POA: Diagnosis not present

## 2017-11-09 LAB — CBC WITH DIFFERENTIAL/PLATELET
Basophils Absolute: 0 10*3/uL (ref 0.0–0.1)
Basophils Relative: 0 %
Eosinophils Absolute: 0.1 10*3/uL (ref 0.0–0.7)
Eosinophils Relative: 2 %
HCT: 40.2 % (ref 36.0–46.0)
Hemoglobin: 12.6 g/dL (ref 12.0–15.0)
Lymphocytes Relative: 20 %
Lymphs Abs: 1.1 10*3/uL (ref 0.7–4.0)
MCH: 28.3 pg (ref 26.0–34.0)
MCHC: 31.3 g/dL (ref 30.0–36.0)
MCV: 90.1 fL (ref 78.0–100.0)
Monocytes Absolute: 0.5 10*3/uL (ref 0.1–1.0)
Monocytes Relative: 8 %
Neutro Abs: 3.9 10*3/uL (ref 1.7–7.7)
Neutrophils Relative %: 70 %
Platelets: 100 10*3/uL — ABNORMAL LOW (ref 150–400)
RBC: 4.46 MIL/uL (ref 3.87–5.11)
RDW: 16.3 % — ABNORMAL HIGH (ref 11.5–15.5)
WBC: 5.6 10*3/uL (ref 4.0–10.5)

## 2017-11-09 LAB — BASIC METABOLIC PANEL
Anion gap: 12 (ref 5–15)
BUN: 11 mg/dL (ref 6–20)
CO2: 29 mmol/L (ref 22–32)
Calcium: 8.2 mg/dL — ABNORMAL LOW (ref 8.9–10.3)
Chloride: 96 mmol/L — ABNORMAL LOW (ref 101–111)
Creatinine, Ser: 3.72 mg/dL — ABNORMAL HIGH (ref 0.44–1.00)
GFR calc Af Amer: 12 mL/min — ABNORMAL LOW (ref >60)
GFR calc non Af Amer: 10 mL/min — ABNORMAL LOW (ref >60)
Glucose, Bld: 142 mg/dL — ABNORMAL HIGH (ref 65–99)
Potassium: 3.5 mmol/L (ref 3.5–5.1)
Sodium: 137 mmol/L (ref 135–145)

## 2017-11-09 NOTE — ED Provider Notes (Signed)
Patient placed in Quick Look pathway, seen and evaluated   Chief Complaint: Blister to right foot  HPI:   Patient presents sent in after dialysis today.  She received dialysis Tuesday Thursday Saturday, received the full treatment today.  She awoke this morning with a blister to the lateral aspect of her right foot.  She has a history of peripheral vascular disease and left BKA.  She denies pain to the foot.  She has had ongoing problems with her right great toenail and was seen in the ED for an avulsion of the toenail in January.  She denies fevers or chills.  She denies pain to the foot.  She was given vancomycin and ceftazidime in dialysis today.  ROS: Positive for wound, negative for fever, chills  Physical Exam:   Gen: No distress  Neuro: Awake and Alert  Skin: Warm    Focused Exam: 2+ DP and PT pulses to the right lower extremity, 1+ pitting edema of the lower extremity.  Large blister to the lateral aspect of the right foot.  The foot is warm to touch.  There is darkening color change distally.  There is a crusting noted to the right great toe.  No drainage.  Nontender to palpation.     Initiation of care has begun. The patient has been counseled on the process, plan, and necessity for staying for the completion/evaluation, and the remainder of the medical screening examination    Bennye AlmFawze, Yolonda Purtle A, PA-C 11/09/17 1849    Loren RacerYelverton, David, MD 11/11/17 1005

## 2017-11-09 NOTE — ED Triage Notes (Addendum)
PT arrives from dialysis after physician sent her in for blisters on right foot. PT has large blister to right outer aspect of foot that pt noticed this morning.   130/62 Hr 82 rr 18 spo2 96% RA Dialysis T, TH, SA- received full tx today. Pt received 1,00mg  of vanc and 2000mg  of ceftazidime today at dialysis

## 2017-11-10 ENCOUNTER — Emergency Department (HOSPITAL_COMMUNITY)
Admission: EM | Admit: 2017-11-10 | Discharge: 2017-11-10 | Disposition: A | Discharge status: Home / Self Care | Payer: Medicare Other | Attending: Emergency Medicine | Admitting: Emergency Medicine

## 2017-11-10 ENCOUNTER — Other Ambulatory Visit: Payer: Self-pay

## 2017-11-10 DIAGNOSIS — T82511D Breakdown (mechanical) of surgically created arteriovenous shunt, subsequent encounter: Secondary | ICD-10-CM | POA: Diagnosis not present

## 2017-11-10 DIAGNOSIS — Z89512 Acquired absence of left leg below knee: Secondary | ICD-10-CM | POA: Diagnosis not present

## 2017-11-10 DIAGNOSIS — I120 Hypertensive chronic kidney disease with stage 5 chronic kidney disease or end stage renal disease: Secondary | ICD-10-CM | POA: Diagnosis not present

## 2017-11-10 DIAGNOSIS — S90821A Blister (nonthermal), right foot, initial encounter: Secondary | ICD-10-CM | POA: Diagnosis not present

## 2017-11-10 DIAGNOSIS — N186 End stage renal disease: Secondary | ICD-10-CM | POA: Diagnosis not present

## 2017-11-10 DIAGNOSIS — Z992 Dependence on renal dialysis: Secondary | ICD-10-CM | POA: Diagnosis not present

## 2017-11-10 DIAGNOSIS — E1122 Type 2 diabetes mellitus with diabetic chronic kidney disease: Secondary | ICD-10-CM | POA: Diagnosis not present

## 2017-11-10 NOTE — ED Notes (Signed)
Per MD, xeroform with gauze dressing applied to right big toe and top of foot with ace bandage.

## 2017-11-10 NOTE — ED Provider Notes (Addendum)
TIME SEEN: 4:05 AM  CHIEF COMPLAINT: Blister to the right lateral foot  HPI: Patient is an 83 year old female with history of hypertension, hyperlipidemia, diabetes, end-stage renal disease on hemodialysis Tuesday, Thursday and Saturday who was last dialyzed now yesterday who presents to the emergency department for evaluation for a blister to the right lateral foot.  Was seen by her nephrologist Dr. decorating who recommended she come to the ED for further evaluation.  Was given IV vancomycin and ceftaz edema at dialysis.  She does not recall any injury to this foot and it has not been painful.  It has not been draining.  It has not been red, hot or even warm.  She has not had any fever.  She is otherwise feeling well.  She is status post left BKA.  Blood sugars have been under control.  No missed dialysis.  She does not recall any injury to the foot.  She has been able to bear weight with this leg.  No numbness or tingling.  Previously had an avulsion of the right great toenail with no drainage.  ROS: See HPI Constitutional: no fever  Eyes: no drainage  ENT: no runny nose   Cardiovascular:  no chest pain  Resp: no SOB  GI: no vomiting GU: no dysuria Integumentary: no rash  Allergy: no hives  Musculoskeletal: no leg swelling  Neurological: no slurred speech ROS otherwise negative  PAST MEDICAL HISTORY/PAST SURGICAL HISTORY:  Past Medical History:  Diagnosis Date  . Anemia   . Arthritis   . Diabetes mellitus   . End-stage renal disease on hemodialysis (HCC)    dialysis T/Th/Sa  . Hyperlipidemia   . Hypertension   . Pneumonia   . Thyroid disease    pt denies this    MEDICATIONS:  Prior to Admission medications   Medication Sig Start Date End Date Taking? Authorizing Provider  albuterol (PROVENTIL HFA;VENTOLIN HFA) 108 (90 BASE) MCG/ACT inhaler Inhale 1-2 puffs into the lungs every 6 (six) hours as needed for wheezing. 12/27/12   Palumbo, April, MD  cefTAZidime 2 g in dextrose 5 %  50 mL Inject 2 g into the vein Every Tuesday,Thursday,and Saturday with dialysis. 12/08/14   Osvaldo Shipper, MD  cephALEXin (KEFLEX) 500 MG capsule Take 1 capsule (500 mg total) by mouth daily. On the day of your dialysis please take the Keflex after the dialysis session 08/20/17   Park Liter, DPM  cinacalcet (SENSIPAR) 30 MG tablet Take 30 mg by mouth daily.    [provider]  clopidogrel (PLAVIX) 75 MG tablet TAKE ONE TABLET   BY MOUTH   DAILY 02/15/17   Chuck Hint, MD  doxycycline (VIBRAMYCIN) 100 MG capsule Take 1 capsule (100 mg total) by mouth 2 (two) times daily. 08/20/17   Park Liter, DPM  insulin detemir (LEVEMIR) 100 UNIT/ML injection Inject 0.1 mLs (10 Units total) into the skin at bedtime. Patient taking differently: Inject 15 Units into the skin at bedtime.  12/07/14   Osvaldo Shipper, MD  metoprolol tartrate (LOPRESSOR) 50 MG tablet Take 50 mg by mouth 2 (two) times daily. 08/19/17   [provider]  oxyCODONE (ROXICODONE) 5 MG immediate release tablet Take 1 tablet (5 mg total) by mouth every 4 (four) hours as needed for severe pain. 10/04/17   Chuck Hint, MD  oxyCODONE-acetaminophen (ROXICET) 5-325 MG tablet Take 1 tablet by mouth every 4 (four) hours as needed for severe pain. 04/25/15   Nadara Mustard, MD  promethazine (PHENERGAN) 12.5 MG tablet Take 12.5 mg by mouth daily as needed for nausea or vomiting.  07/22/17   [provider]  saccharomyces boulardii (FLORASTOR) 250 MG capsule Take 1 capsule (250 mg total) by mouth 2 (two) times daily. 12/07/14   Osvaldo Shipper, MD  sevelamer carbonate (RENVELA) 2.4 g PACK Take 2.4 g by mouth 3 (three) times daily. 09/06/17   [provider]  warfarin (COUMADIN) 5 MG tablet Take 1 to 1 and 1/2 tablets daily as directed by coumadin clinic 09/28/17   Chrystie Nose, MD  rivaroxaban (XARELTO) 10 MG TABS tablet Take 10 mg by mouth daily.  07/23/17  [provider]     ALLERGIES:  No Known Allergies  SOCIAL HISTORY:  Social History   Tobacco Use  . Smoking status: Never Smoker  . Smokeless tobacco: Never Used  Substance Use Topics  . Alcohol use: No    FAMILY HISTORY: Family History  Problem Relation Age of Onset  . Diabetes Mother   . Heart disease Mother   . Hypertension Mother     EXAM: BP (!) 158/87   Pulse 90   Temp 97.6 F (36.4 C) (Oral)   Resp 19   Wt 59.9 kg (132 lb)   SpO2 100%   BMI 19.49 kg/m  CONSTITUTIONAL: Alert and oriented and responds appropriately to questions.  Elderly, chronically ill-appearing, smiling and laughing, in no distress, afebrile HEAD: Normocephalic EYES: Conjunctivae clear, pupils appear equal, EOMI ENT: normal nose; moist mucous membranes NECK: Supple, no meningismus, no nuchal rigidity, no LAD  CARD: RRR; S1 and S2 appreciated; no murmurs, no clicks, no rubs, no gallops RESP: Normal chest excursion without splinting or tachypnea; breath sounds clear and equal bilaterally; no wheezes, no rhonchi, no rales, no hypoxia or respiratory distress, speaking full sentences ABD/GI: Normal bowel sounds; non-distended; soft, non-tender, no rebound, no guarding, no peritoneal signs, no hepatosplenomegaly BACK:  The back appears normal and is non-tender to palpation, there is no CVA tenderness EXT: Patient is status post left BKA.  Right foot has a large blister to the lateral aspect of the foot.  Please see picture in MSE note.  There is no redness, warmth noted to this foot at all at this time.  Her extremity is warm and well perfused with palpable PT and DP pulses.  There is no drainage from the blister.  No surrounding induration or tenderness.  No other soft tissue swelling.  She has no other skin changes or signs of gangrene on exam.  No crepitus or subcutaneous emphysema noted.  No joint effusion.  No calf tenderness or swelling.  No bony tenderness or deformity.  Normal ROM in all joints; non-tender to  palpation; no edema; normal capillary refill; no cyanosis, no calf tenderness or swelling, normal sensation in the right leg    SKIN: Normal color for age and race; warm; no rash, blister to the right lateral foot but no other blisters or desquamation, no petechiae or purpura, no sign of gangrene, no other open wounds, no sign of cellulitis or abscess NEURO: Moves all extremities equally PSYCH: The patient's mood and manner are appropriate. Grooming and personal hygiene are appropriate.  MEDICAL DECISION MAKING: Patient here with blister to the foot.  I have aspirated this blister with an 18-gauge needle after cleaning it thoroughly with ChloraPrep and have obtained a large amount of clear, slightly serosanguineous appearing fluid.  There is no purulent drainage.  There is no induration.  No redness  or warmth.  She is neurovascularly intact distally.  This does not appear infected to me today and I do not feel she needs further antibiotics.  She has follow-up with podiatry already scheduled in 2 days.  The wound on her right great toe does not appear infected.  She had an x-ray that just showed soft tissue swelling but no other acute abnormality.  There is no sign of gout, septic arthritis, necrotizing fasciitis, cellulitis or abscess on examination today.  She is extremely well-appearing with no systemic symptoms, smiling and laughing with normal vital signs and no fever.  Labs obtained are also normal with no leukocytosis.  Discussed wound care instructions with family.  We have again aspirated this blister and is now completely decompressed and will apply nonadherent sterile dressings to her right great toe and over the right lateral foot with a compression wrap.  A wound culture of the fluid from the blister has been sent for evaluation.  I do not feel that she needs this blister deroofed at this time as it is quite large and I feel could put her more at risk for infection given she is diabetic and on  dialysis.  She will follow-up with podiatry as scheduled in 2 days.  She has no pain in this area and does not need to be discharged with narcotic pain medication.  She has a PCP for close follow-up and plans to go to dialysis tomorrow.  Discussed at length return precautions with patient and her son.  She has been ambulatory.  They are comfortable with this plan.   At this time, I do not feel there is any life-threatening condition present. I have reviewed and discussed all results (EKG, imaging, lab, urine as appropriate) and exam findings with patient/family. I have reviewed nursing notes and appropriate previous records.  I feel the patient is safe to be discharged home without further emergent workup and can continue workup as an outpatient as needed. Discussed usual and customary return precautions. Patient/family verbalize understanding and are comfortable with this plan.  Outpatient follow-up has been provided if needed. All questions have been answered.          Callaway Hardigree, Layla MawKristen N, DO 11/10/17 [id number]

## 2017-11-11 DIAGNOSIS — L03115 Cellulitis of right lower limb: Secondary | ICD-10-CM | POA: Diagnosis not present

## 2017-11-11 DIAGNOSIS — E1129 Type 2 diabetes mellitus with other diabetic kidney complication: Secondary | ICD-10-CM | POA: Diagnosis not present

## 2017-11-11 DIAGNOSIS — D631 Anemia in chronic kidney disease: Secondary | ICD-10-CM | POA: Diagnosis not present

## 2017-11-11 DIAGNOSIS — N2581 Secondary hyperparathyroidism of renal origin: Secondary | ICD-10-CM | POA: Diagnosis not present

## 2017-11-11 DIAGNOSIS — N186 End stage renal disease: Secondary | ICD-10-CM | POA: Diagnosis not present

## 2017-11-11 DIAGNOSIS — D509 Iron deficiency anemia, unspecified: Secondary | ICD-10-CM | POA: Diagnosis not present

## 2017-11-12 ENCOUNTER — Telehealth: Payer: Self-pay | Admitting: *Deleted

## 2017-11-12 ENCOUNTER — Encounter

## 2017-11-12 ENCOUNTER — Ambulatory Visit (INDEPENDENT_AMBULATORY_CARE_PROVIDER_SITE_OTHER): Payer: Medicare Other | Admitting: Podiatry

## 2017-11-12 DIAGNOSIS — Z89512 Acquired absence of left leg below knee: Secondary | ICD-10-CM

## 2017-11-12 DIAGNOSIS — E1151 Type 2 diabetes mellitus with diabetic peripheral angiopathy without gangrene: Secondary | ICD-10-CM

## 2017-11-12 DIAGNOSIS — I739 Peripheral vascular disease, unspecified: Secondary | ICD-10-CM | POA: Diagnosis not present

## 2017-11-12 DIAGNOSIS — S90821A Blister (nonthermal), right foot, initial encounter: Secondary | ICD-10-CM | POA: Diagnosis not present

## 2017-11-12 LAB — AEROBIC CULTURE W GRAM STAIN (SUPERFICIAL SPECIMEN)

## 2017-11-12 LAB — AEROBIC CULTURE  (SUPERFICIAL SPECIMEN)
CULTURE: NO GROWTH
GRAM STAIN: NONE SEEN

## 2017-11-12 NOTE — Progress Notes (Signed)
  Subjective:  Patient ID: Jackie [name], female    DOB: 08/01/1934,  MRN: [id number]  No chief complaint on file.  83 y.o. female returns for foot care.  Reports blister to the right foot.  States that she went to the ER for 24 where the blister was lanced but fluid has returned.  States that x-rays were taken and culture was performed of the fluid.  Told she did not have an infection was she was not started on antibiotics.  End-stage renal disease on dialysis with a history of left-sided BKA.  Objective:  There were no vitals filed for this visit. General AA&O x3. Normal mood and affect.  Vascular Pedal pulses nonpalpable foot cool to touch..  Absent capillary refill  Neurologic Epicritic sensation grossly intact.  Sensation intact all digits right foot  Dermatologic  6x5 blister to the right foot with healthy base after expression of fluid and excision of redundant bulla.  Follow wit serous drainage prior to expression.  Right hallux tip with callus formation concerns for early dry gangrene.  Orthopedic:  History left BKA Right foot hammertoe deformities   Assessment & Plan:  Patient was evaluated and treated and all questions answered.  Blister R Foot, in the setting of PAD -Blister right foot in the setting of PAD concerning for vascular insufficiency.  Toes very dark without good evidence of capillary refill, foot cool to touch.  Will refer to vascular for evaluation -Blister lanced and redundant bulla excised Betadine painted to the wound.  Telfa and gauze wrap applied to the foot.  15 minutes of face to face time were spent with the patient. >50% of this was spent on counseling and coordination of care. Specifically discussed with patient the PAD and concerns for healing potential of her foot. Her foot shows signs of ischemia and would benefit from vascular referral.  No follow-ups on file.

## 2017-11-12 NOTE — Telephone Encounter (Signed)
Drinda Buttsnnette - [facility] states send referral, clinicals to [phone] Attn: Annette to see Dr. Myra GianottiBrabham. Referral given to Tomasa HoseJ. Quintana, RN to fax in my absence.

## 2017-11-13 DIAGNOSIS — L03115 Cellulitis of right lower limb: Secondary | ICD-10-CM | POA: Diagnosis not present

## 2017-11-13 DIAGNOSIS — N2581 Secondary hyperparathyroidism of renal origin: Secondary | ICD-10-CM | POA: Diagnosis not present

## 2017-11-13 DIAGNOSIS — N186 End stage renal disease: Secondary | ICD-10-CM | POA: Diagnosis not present

## 2017-11-13 DIAGNOSIS — D509 Iron deficiency anemia, unspecified: Secondary | ICD-10-CM | POA: Diagnosis not present

## 2017-11-13 DIAGNOSIS — D631 Anemia in chronic kidney disease: Secondary | ICD-10-CM | POA: Diagnosis not present

## 2017-11-13 DIAGNOSIS — E1129 Type 2 diabetes mellitus with other diabetic kidney complication: Secondary | ICD-10-CM | POA: Diagnosis not present

## 2017-11-14 NOTE — Progress Notes (Signed)
  Subjective:  Patient ID: Jackie [name], female    DOB: 30-Oct-1934,  MRN: [id number]  Chief Complaint  Patient presents with  . Nail Problem    R. great toenail (loose) x 5 days, no pain, white drainage Tx: cephalexin  and Doxycycline  Pt went toe ED x 5 days for the same reasone Pt. stated," diabetes tyep 2 sugar:controlled A1c:dont remember."   83 y.o. female returns for the above complaint. Reports R great toenail is loose x5 days. Denies pain. Denies drainage. Went to the ED for the above.  Objective:  There were no vitals filed for this visit. General AA&O x3. Normal mood and affect.  Vascular Pedal pulses palpable.  Neurologic Epicritic sensation grossly intact.  Dermatologic No open lesions. Skin normal texture and turgor. Toenails x 10 elongated, thickened, dystrophic.  Orthopedic: Pain to palpation about the toenails. History L BKA.   Assessment & Plan:  Patient was evaluated and treated and all questions answered.  Onychomycosis with Amputation History, Onycholysis -Nails debrided as below. -Educated on self-care -Educatred on gentle soaking of the nail.  Procedure: Nail Debridement Rationale: pain  Type of Debridement: manual, sharp debridement. Instrumentation: Nail nipper, rotary burr. Number of Nails: 5     No follow-ups on file.

## 2017-11-16 DIAGNOSIS — E1129 Type 2 diabetes mellitus with other diabetic kidney complication: Secondary | ICD-10-CM | POA: Diagnosis not present

## 2017-11-16 DIAGNOSIS — N186 End stage renal disease: Secondary | ICD-10-CM | POA: Diagnosis not present

## 2017-11-16 DIAGNOSIS — D631 Anemia in chronic kidney disease: Secondary | ICD-10-CM | POA: Diagnosis not present

## 2017-11-16 DIAGNOSIS — D509 Iron deficiency anemia, unspecified: Secondary | ICD-10-CM | POA: Diagnosis not present

## 2017-11-16 DIAGNOSIS — L03115 Cellulitis of right lower limb: Secondary | ICD-10-CM | POA: Diagnosis not present

## 2017-11-16 DIAGNOSIS — N2581 Secondary hyperparathyroidism of renal origin: Secondary | ICD-10-CM | POA: Diagnosis not present

## 2017-11-17 ENCOUNTER — Telehealth: Payer: Self-pay | Admitting: Podiatry

## 2017-11-17 ENCOUNTER — Other Ambulatory Visit: Payer: Self-pay

## 2017-11-17 DIAGNOSIS — I120 Hypertensive chronic kidney disease with stage 5 chronic kidney disease or end stage renal disease: Secondary | ICD-10-CM | POA: Diagnosis not present

## 2017-11-17 DIAGNOSIS — N186 End stage renal disease: Secondary | ICD-10-CM | POA: Diagnosis not present

## 2017-11-17 DIAGNOSIS — I739 Peripheral vascular disease, unspecified: Secondary | ICD-10-CM

## 2017-11-17 DIAGNOSIS — L03115 Cellulitis of right lower limb: Secondary | ICD-10-CM | POA: Diagnosis not present

## 2017-11-17 DIAGNOSIS — N2581 Secondary hyperparathyroidism of renal origin: Secondary | ICD-10-CM | POA: Diagnosis not present

## 2017-11-17 DIAGNOSIS — E1122 Type 2 diabetes mellitus with diabetic chronic kidney disease: Secondary | ICD-10-CM | POA: Diagnosis not present

## 2017-11-17 DIAGNOSIS — Z992 Dependence on renal dialysis: Secondary | ICD-10-CM | POA: Diagnosis not present

## 2017-11-17 DIAGNOSIS — T82511D Breakdown (mechanical) of surgically created arteriovenous shunt, subsequent encounter: Secondary | ICD-10-CM | POA: Diagnosis not present

## 2017-11-17 DIAGNOSIS — D509 Iron deficiency anemia, unspecified: Secondary | ICD-10-CM | POA: Diagnosis not present

## 2017-11-17 DIAGNOSIS — Z89512 Acquired absence of left leg below knee: Secondary | ICD-10-CM | POA: Diagnosis not present

## 2017-11-17 DIAGNOSIS — E1129 Type 2 diabetes mellitus with other diabetic kidney complication: Secondary | ICD-10-CM | POA: Diagnosis not present

## 2017-11-17 NOTE — Telephone Encounter (Signed)
This is French Ana, Charity fundraiser with [facility]. I'm currently with Ms. [place]. We have been seeing her for other things but I noticed she has a wound to her right foot. Also, she has another little skin tear to her left stump. I redressed her foot and the stump because she had so much drainage. She did tell me she has an appointment coming up with you guys this coming Friday. I was calling to let you know we are in working with her and if you want [place] to continue doing wound care you can just fax the orders over to [facility]. Pretty much only thing I did today was just change the dressing, put some more gauze on it, and cleaned it. If you need to call me, my number is [phone]. Thank you.

## 2017-11-17 NOTE — Telephone Encounter (Signed)
Called and spoke to [place]. Ordered betadine PRN for maceration. Will see patient Friday in office

## 2017-11-18 DIAGNOSIS — D509 Iron deficiency anemia, unspecified: Secondary | ICD-10-CM | POA: Diagnosis not present

## 2017-11-18 DIAGNOSIS — L03115 Cellulitis of right lower limb: Secondary | ICD-10-CM | POA: Diagnosis not present

## 2017-11-18 DIAGNOSIS — N186 End stage renal disease: Secondary | ICD-10-CM | POA: Diagnosis not present

## 2017-11-18 DIAGNOSIS — E1129 Type 2 diabetes mellitus with other diabetic kidney complication: Secondary | ICD-10-CM | POA: Diagnosis not present

## 2017-11-18 DIAGNOSIS — N2581 Secondary hyperparathyroidism of renal origin: Secondary | ICD-10-CM | POA: Diagnosis not present

## 2017-11-19 ENCOUNTER — Ambulatory Visit (INDEPENDENT_AMBULATORY_CARE_PROVIDER_SITE_OTHER): Payer: Medicare Other | Admitting: Podiatry

## 2017-11-19 ENCOUNTER — Encounter: Payer: Self-pay | Admitting: Podiatry

## 2017-11-19 DIAGNOSIS — B351 Tinea unguium: Secondary | ICD-10-CM

## 2017-11-19 DIAGNOSIS — I739 Peripheral vascular disease, unspecified: Secondary | ICD-10-CM

## 2017-11-19 DIAGNOSIS — Z89512 Acquired absence of left leg below knee: Secondary | ICD-10-CM | POA: Diagnosis not present

## 2017-11-19 DIAGNOSIS — S90821D Blister (nonthermal), right foot, subsequent encounter: Secondary | ICD-10-CM

## 2017-11-20 ENCOUNTER — Other Ambulatory Visit: Payer: Self-pay | Admitting: Internal Medicine

## 2017-11-20 DIAGNOSIS — L03115 Cellulitis of right lower limb: Secondary | ICD-10-CM | POA: Diagnosis not present

## 2017-11-20 DIAGNOSIS — N2581 Secondary hyperparathyroidism of renal origin: Secondary | ICD-10-CM | POA: Diagnosis not present

## 2017-11-20 DIAGNOSIS — N186 End stage renal disease: Secondary | ICD-10-CM | POA: Diagnosis not present

## 2017-11-20 DIAGNOSIS — D509 Iron deficiency anemia, unspecified: Secondary | ICD-10-CM | POA: Diagnosis not present

## 2017-11-20 DIAGNOSIS — E1129 Type 2 diabetes mellitus with other diabetic kidney complication: Secondary | ICD-10-CM | POA: Diagnosis not present

## 2017-11-22 DIAGNOSIS — Z452 Encounter for adjustment and management of vascular access device: Secondary | ICD-10-CM | POA: Diagnosis not present

## 2017-11-23 DIAGNOSIS — E1129 Type 2 diabetes mellitus with other diabetic kidney complication: Secondary | ICD-10-CM | POA: Diagnosis not present

## 2017-11-23 DIAGNOSIS — L03115 Cellulitis of right lower limb: Secondary | ICD-10-CM | POA: Diagnosis not present

## 2017-11-23 DIAGNOSIS — N2581 Secondary hyperparathyroidism of renal origin: Secondary | ICD-10-CM | POA: Diagnosis not present

## 2017-11-23 DIAGNOSIS — D509 Iron deficiency anemia, unspecified: Secondary | ICD-10-CM | POA: Diagnosis not present

## 2017-11-23 DIAGNOSIS — N186 End stage renal disease: Secondary | ICD-10-CM | POA: Diagnosis not present

## 2017-11-24 ENCOUNTER — Ambulatory Visit (INDEPENDENT_AMBULATORY_CARE_PROVIDER_SITE_OTHER): Payer: Medicare Other | Admitting: Surgery

## 2017-11-24 ENCOUNTER — Encounter (HOSPITAL_COMMUNITY): Payer: Medicare Other

## 2017-11-24 ENCOUNTER — Other Ambulatory Visit: Payer: Self-pay | Admitting: *Deleted

## 2017-11-24 ENCOUNTER — Encounter: Payer: Self-pay | Admitting: *Deleted

## 2017-11-24 ENCOUNTER — Ambulatory Visit: Payer: Medicare Other | Admitting: Surgery

## 2017-11-24 ENCOUNTER — Ambulatory Visit (HOSPITAL_COMMUNITY)
Admission: RE | Admit: 2017-11-24 | Discharge: 2017-11-24 | Disposition: A | Discharge status: Home / Self Care | Payer: Medicare Other | Source: Ambulatory Visit | Attending: Surgery | Admitting: Surgery

## 2017-11-24 ENCOUNTER — Encounter: Payer: Self-pay | Admitting: Surgery

## 2017-11-24 DIAGNOSIS — I7025 Atherosclerosis of native arteries of other extremities with ulceration: Secondary | ICD-10-CM

## 2017-11-24 DIAGNOSIS — I739 Peripheral vascular disease, unspecified: Secondary | ICD-10-CM | POA: Diagnosis not present

## 2017-11-24 NOTE — Progress Notes (Signed)
[facility] [place]  Patient name: Jackie [name] MRN: [id number] DOB: 22-Nov-1934 Sex: female   REASON FOR VISIT:    Follow up  [place]:    ADELISE [name] is a 83 y.o. female with end-stage renal disease who I met in 2016 when she presented with bilateral lower extremity ulcers.  I felt that she was at high risk for bilateral amputation based on the appearance of her wounds.   She underwent an attempted revascularization on the right but was unsuccessful.  She went for wound care and was able to heal the wounds on her right side.  She ultimately underwent below-knee amputation by Dr. due to on the left.  The patient recently had blisters drained on her right foot by her podiatrist.  She also has had some drainage from her below-knee amputation site on the left.  She is sent back for further vascular evaluation.  PAST MEDICAL HISTORY:   Past Medical History:  Diagnosis Date  . Anemia   . Arthritis   . Diabetes mellitus   . End-stage renal disease on hemodialysis ([place])    dialysis T/Th/Sa  . Hyperlipidemia   . Hypertension   . Pneumonia   . Thyroid disease    pt denies this     FAMILY HISTORY:   Family History  Problem Relation Age of Onset  . Diabetes Mother   . Heart disease Mother   . Hypertension Mother     SOCIAL HISTORY:   Social History   Tobacco Use  . Smoking status: Never Smoker  . Smokeless tobacco: Never Used  Substance Use Topics  . Alcohol use: No     ALLERGIES:   No Known Allergies   CURRENT MEDICATIONS:   Current Outpatient Medications  Medication Sig Dispense Refill  . albuterol (PROVENTIL HFA;VENTOLIN HFA) 108 (90 BASE) MCG/ACT inhaler Inhale 1-2 puffs into the lungs every 6 (six) hours as needed for wheezing. 1 Inhaler 0  . cefTAZidime 2 g in dextrose 5 % 50 mL Inject 2 g into the vein Every Tuesday,Thursday,and Saturday with dialysis.    . cephALEXin  (KEFLEX) 500 MG capsule Take 1 capsule (500 mg total) by mouth daily. On the day of your dialysis please take the Keflex after the dialysis session 7 capsule 0  . cinacalcet (SENSIPAR) 30 MG tablet Take 30 mg by mouth daily.    . clopidogrel (PLAVIX) 75 MG tablet TAKE ONE TABLET   BY MOUTH   DAILY 30 tablet 9  . doxycycline (VIBRAMYCIN) 100 MG capsule Take 1 capsule (100 mg total) by mouth 2 (two) times daily. 14 capsule 0  . insulin detemir (LEVEMIR) 100 UNIT/ML injection Inject 0.1 mLs (10 Units total) into the skin at bedtime. (Patient taking differently: Inject 15 Units into the skin at bedtime. ) 10 mL 11  . metoprolol tartrate (LOPRESSOR) 50 MG tablet Take 50 mg by mouth 2 (two) times daily.    Marland Kitchen oxyCODONE (ROXICODONE) 5 MG immediate release tablet Take 1 tablet (5 mg total) by mouth every 4 (four) hours as needed for severe pain. 20 tablet 0  . oxyCODONE-acetaminophen (ROXICET) 5-325 MG tablet Take 1 tablet by mouth every 4 (four) hours as needed for severe pain. 60 tablet 0  . promethazine (PHENERGAN) 12.5 MG tablet Take 12.5 mg by mouth daily as needed for nausea or vomiting.     . saccharomyces boulardii (FLORASTOR) 250 MG capsule Take 1 capsule (250 mg total) by mouth 2 (  two) times daily. 30 capsule 0  . sevelamer carbonate (RENVELA) 2.4 g PACK Take 2.4 g by mouth 3 (three) times daily.    Marland Kitchen warfarin (COUMADIN) 5 MG tablet TAKE 1 TO 1 AND 1/2 TABLETS BY MOUTH DAILY AS DIRECTED BY COUMADIN CLINIC 45 tablet 1   No current facility-administered medications for this visit.     REVIEW OF SYSTEMS:   _0  denotes positive finding, _1  denotes negative finding Cardiac  Comments:  Chest pain or chest pressure:    Shortness of breath upon exertion:    Short of breath when lying flat:    Irregular heart rhythm:        Vascular    Pain in calf, thigh, or hip brought on by ambulation:    Pain in feet at night that wakes you up from your sleep:     Blood clot in your veins:    Leg swelling:          Pulmonary    Oxygen at home:    Productive cough:     Wheezing:         Neurologic    Sudden weakness in arms or legs:     Sudden numbness in arms or legs:     Sudden onset of difficulty speaking or slurred speech:    Temporary loss of vision in one eye:     Problems with dizziness:         Gastrointestinal    Blood in stool:     Vomited blood:         Genitourinary    Burning when urinating:     Blood in urine:        Psychiatric    Major depression:         Hematologic    Bleeding problems:    Problems with blood clotting too easily:        Skin    Rashes or ulcers: x       Constitutional    Fever or chills:      PHYSICAL EXAM:   There were no vitals filed for this visit.  GENERAL: The patient is a well-nourished female, in no acute distress. The vital signs are documented above. CARDIAC: There is a regular rate and rhythm.  VASCULAR: Nonpalpable pedal pulse on the right PULMONARY: Non-labored respirations ABDOMEN: Soft and non-tender with normal pitched bowel sounds.  MUSCULOSKELETAL: There are no major deformities or cyanosis. NEUROLOGIC: No focal weakness or paresthesias are detected. SKIN: Right foot blisters.  Drainage from left below-knee amputation PSYCHIATRIC: The patient has a normal affect.  STUDIES:   Unable to identify vessels on the right for vascular lab study  MEDICAL ISSUES:   Right foot wound and left below-knee amputation wound: The patient understands that this is a limb threatening situation on the right which could lead to amputation.  She also may need a more proximal revision on the left.  I have recommended that we proceed with angiogram to see what changes have occurred over the past several years and to see if she has any options for revascularization.  She is on dialysis Tuesday Thursday Saturday and so this will need to be scheduled on a nondialysis day.  She is on Coumadin which will need to be stopped for her  procedure.    Annamarie Major, MD [facility] [phone] Pager [phone]

## 2017-11-25 DIAGNOSIS — N186 End stage renal disease: Secondary | ICD-10-CM | POA: Diagnosis not present

## 2017-11-25 DIAGNOSIS — E1129 Type 2 diabetes mellitus with other diabetic kidney complication: Secondary | ICD-10-CM | POA: Diagnosis not present

## 2017-11-25 DIAGNOSIS — L03115 Cellulitis of right lower limb: Secondary | ICD-10-CM | POA: Diagnosis not present

## 2017-11-25 DIAGNOSIS — N2581 Secondary hyperparathyroidism of renal origin: Secondary | ICD-10-CM | POA: Diagnosis not present

## 2017-11-25 DIAGNOSIS — D509 Iron deficiency anemia, unspecified: Secondary | ICD-10-CM | POA: Diagnosis not present

## 2017-11-26 ENCOUNTER — Ambulatory Visit: Payer: Medicare Other | Admitting: Podiatry

## 2017-11-26 ENCOUNTER — Telehealth: Payer: Self-pay | Admitting: Pharmacist

## 2017-11-26 NOTE — Telephone Encounter (Signed)
-----   Message from Sharee Pimple, RN sent at 11/25/2017  4:41 PM EDT ----- R[facility] [place] to reschedule, she will actually be admitted overnight after her surgery ----- Message ----- From: Pearletha Furl, [facility] Sent: 11/25/2017   4:21 PM To: Sharee Pimple, RN  Do you still need and INR on 5/13 (day prior to procedure) Or okay to re-schedule?

## 2017-11-26 NOTE — Telephone Encounter (Signed)
-----   Message -----  From: Sharee Pimple, RN  Sent: 11/24/2017  4:09 PM  To: Dan Maker [facility] [place]  Subject: Stopping Coumadin                 This patient is having an aortogram on Tuesday May 14th with Dr. Myra Gianotti. He has instructed her to stop her Coumadin on Saturday May 11th, last dose on Friday if this is agreeable. I believe she was scheduled to have INR on Monday.   Rudean Haskell, RN  Surgical / Triage Nurse  [facility]  [facility]   [phone]    ========================================== Patient was instructed by surgeon to hold warfarin prior to procedure on 5/14. Patient will be admitted to hospital after surgery.   Will re-schedule INR check for week after procedure.

## 2017-11-27 DIAGNOSIS — N2581 Secondary hyperparathyroidism of renal origin: Secondary | ICD-10-CM | POA: Diagnosis not present

## 2017-11-27 DIAGNOSIS — E1129 Type 2 diabetes mellitus with other diabetic kidney complication: Secondary | ICD-10-CM | POA: Diagnosis not present

## 2017-11-27 DIAGNOSIS — L03115 Cellulitis of right lower limb: Secondary | ICD-10-CM | POA: Diagnosis not present

## 2017-11-27 DIAGNOSIS — N186 End stage renal disease: Secondary | ICD-10-CM | POA: Diagnosis not present

## 2017-11-27 DIAGNOSIS — D509 Iron deficiency anemia, unspecified: Secondary | ICD-10-CM | POA: Diagnosis not present

## 2017-11-29 ENCOUNTER — Other Ambulatory Visit: Payer: Self-pay | Admitting: *Deleted

## 2017-11-30 DIAGNOSIS — L03115 Cellulitis of right lower limb: Secondary | ICD-10-CM | POA: Diagnosis not present

## 2017-11-30 DIAGNOSIS — N186 End stage renal disease: Secondary | ICD-10-CM | POA: Diagnosis not present

## 2017-11-30 DIAGNOSIS — N2581 Secondary hyperparathyroidism of renal origin: Secondary | ICD-10-CM | POA: Diagnosis not present

## 2017-11-30 DIAGNOSIS — D509 Iron deficiency anemia, unspecified: Secondary | ICD-10-CM | POA: Diagnosis not present

## 2017-11-30 DIAGNOSIS — E1129 Type 2 diabetes mellitus with other diabetic kidney complication: Secondary | ICD-10-CM | POA: Diagnosis not present

## 2017-12-01 ENCOUNTER — Telehealth: Payer: Self-pay | Admitting: Surgery

## 2017-12-01 ENCOUNTER — Encounter (HOSPITAL_COMMUNITY)
Admission: RE | Disposition: A | Discharge status: Home / Self Care | Payer: Self-pay | Source: Ambulatory Visit | Attending: Vascular Surgery

## 2017-12-01 ENCOUNTER — Ambulatory Visit (HOSPITAL_COMMUNITY)
Admission: RE | Admit: 2017-12-01 | Discharge: 2017-12-01 | Disposition: A | Discharge status: Home / Self Care | Payer: Medicare Other | Source: Ambulatory Visit | Attending: Vascular Surgery | Admitting: Vascular Surgery

## 2017-12-01 DIAGNOSIS — Z89512 Acquired absence of left leg below knee: Secondary | ICD-10-CM | POA: Diagnosis not present

## 2017-12-01 DIAGNOSIS — I70235 Atherosclerosis of native arteries of right leg with ulceration of other part of foot: Secondary | ICD-10-CM | POA: Insufficient documentation

## 2017-12-01 DIAGNOSIS — I70239 Atherosclerosis of native arteries of right leg with ulceration of unspecified site: Secondary | ICD-10-CM | POA: Diagnosis not present

## 2017-12-01 DIAGNOSIS — L97519 Non-pressure chronic ulcer of other part of right foot with unspecified severity: Secondary | ICD-10-CM | POA: Diagnosis not present

## 2017-12-01 DIAGNOSIS — L97129 Non-pressure chronic ulcer of left thigh with unspecified severity: Secondary | ICD-10-CM | POA: Insufficient documentation

## 2017-12-01 DIAGNOSIS — I70249 Atherosclerosis of native arteries of left leg with ulceration of unspecified site: Secondary | ICD-10-CM | POA: Diagnosis not present

## 2017-12-01 DIAGNOSIS — I739 Peripheral vascular disease, unspecified: Secondary | ICD-10-CM | POA: Diagnosis present

## 2017-12-01 HISTORY — PX: ABDOMINAL AORTOGRAM W/LOWER EXTREMITY: CATH118223

## 2017-12-01 HISTORY — PX: PERIPHERAL VASCULAR ATHERECTOMY: CATH118256

## 2017-12-01 LAB — BASIC METABOLIC PANEL
ANION GAP: 15 (ref 5–15)
BUN: 16 mg/dL (ref 6–20)
CO2: 28 mmol/L (ref 22–32)
Calcium: 6.9 mg/dL — ABNORMAL LOW (ref 8.9–10.3)
Chloride: 97 mmol/L — ABNORMAL LOW (ref 101–111)
Creatinine, Ser: 5.35 mg/dL — ABNORMAL HIGH (ref 0.44–1.00)
GFR calc non Af Amer: 7 mL/min — ABNORMAL LOW (ref >60)
GFR, EST AFRICAN AMERICAN: 8 mL/min — AB (ref >60)
Glucose, Bld: 83 mg/dL (ref 65–99)
POTASSIUM: 4.9 mmol/L (ref 3.5–5.1)
SODIUM: 140 mmol/L (ref 135–145)

## 2017-12-01 LAB — GLUCOSE, CAPILLARY
Glucose-Capillary: 86 mg/dL (ref 65–99)
Glucose-Capillary: 85 mg/dL (ref 65–99)
Glucose-Capillary: 95 mg/dL (ref 65–99)

## 2017-12-01 LAB — POCT ACTIVATED CLOTTING TIME
ACTIVATED CLOTTING TIME: 202 s
ACTIVATED CLOTTING TIME: 208 s
Activated Clotting Time: 257 seconds
Activated Clotting Time: 246 seconds
Activated Clotting Time: 191 seconds

## 2017-12-01 LAB — PROTIME-INR
INR: 1.29
Prothrombin Time: 16 seconds — ABNORMAL HIGH (ref 11.4–15.2)

## 2017-12-01 SURGERY — ABDOMINAL AORTOGRAM W/LOWER EXTREMITY
Anesthesia: LOCAL

## 2017-12-01 MED ORDER — HEPARIN SODIUM (PORCINE) 1000 UNIT/ML IJ SOLN
INTRAMUSCULAR | Status: DC | PRN
  Administered 2017-12-01: 6000 [IU] via INTRAVENOUS

## 2017-12-01 MED ORDER — HYDRALAZINE HCL 20 MG/ML IJ SOLN: 5.0000 mg | INTRAMUSCULAR | Status: DC | PRN

## 2017-12-01 MED ORDER — HEPARIN (PORCINE) IN NACL 2-0.9 UNITS/ML
INTRAMUSCULAR | Status: AC | PRN
  Administered 2017-12-01: 500 mL

## 2017-12-01 MED ORDER — ASPIRIN EC 81 MG PO TBEC
81.0000 mg | DELAYED_RELEASE_TABLET | Freq: Every day | ORAL | Status: DC
  Administered 2017-12-01: 81 mg via ORAL
  Filled 2017-12-01: qty 1

## 2017-12-01 MED ORDER — ONDANSETRON HCL 4 MG/2ML IJ SOLN: 4.0000 mg | Freq: Four times a day (QID) | INTRAMUSCULAR | Status: DC | PRN

## 2017-12-01 MED ORDER — MORPHINE SULFATE (PF) 10 MG/ML IV SOLN
2.0000 mg | INTRAVENOUS | Status: DC | PRN
Start: 2017-12-01 — End: 2017-12-02

## 2017-12-01 MED ORDER — SODIUM CHLORIDE 0.9% FLUSH: 3.0000 mL | Freq: Two times a day (BID) | INTRAVENOUS | Status: DC

## 2017-12-01 MED ORDER — VERAPAMIL HCL 2.5 MG/ML IV SOLN
INTRAVENOUS | Status: AC
  Filled 2017-12-01: qty 2

## 2017-12-01 MED ORDER — HEPARIN (PORCINE) IN NACL 1000-0.9 UT/500ML-% IV SOLN
INTRAVENOUS | Status: AC
  Filled 2017-12-01: qty 500

## 2017-12-01 MED ORDER — HEPARIN SODIUM (PORCINE) 1000 UNIT/ML IJ SOLN
INTRAMUSCULAR | Status: AC
  Filled 2017-12-01: qty 1

## 2017-12-01 MED ORDER — SODIUM CHLORIDE 0.9% FLUSH: 3.0000 mL | INTRAVENOUS | Status: DC | PRN

## 2017-12-01 MED ORDER — FENTANYL CITRATE (PF) 100 MCG/2ML IJ SOLN
INTRAMUSCULAR | Status: DC | PRN
  Administered 2017-12-01 (×2): 25 ug via INTRAVENOUS

## 2017-12-01 MED ORDER — LABETALOL HCL 5 MG/ML IV SOLN: 10.0000 mg | INTRAVENOUS | Status: DC | PRN

## 2017-12-01 MED ORDER — FENTANYL CITRATE (PF) 100 MCG/2ML IJ SOLN
INTRAMUSCULAR | Status: AC
  Filled 2017-12-01: qty 2

## 2017-12-01 MED ORDER — LIDOCAINE HCL (PF) 1 % IJ SOLN
INTRAMUSCULAR | Status: AC
  Filled 2017-12-01: qty 30

## 2017-12-01 MED ORDER — IODIXANOL 320 MG/ML IV SOLN
INTRAVENOUS | Status: DC | PRN
  Administered 2017-12-01: 150 mL via INTRA_ARTERIAL

## 2017-12-01 MED ORDER — MIDAZOLAM HCL 2 MG/2ML IJ SOLN
INTRAMUSCULAR | Status: DC | PRN
  Administered 2017-12-01: 1 mg via INTRAVENOUS

## 2017-12-01 MED ORDER — LIDOCAINE HCL (PF) 1 % IJ SOLN
INTRAMUSCULAR | Status: DC | PRN
  Administered 2017-12-01: 15 mL

## 2017-12-01 MED ORDER — ATORVASTATIN CALCIUM 10 MG PO TABS
10.0000 mg | ORAL_TABLET | Freq: Every day | ORAL | Status: DC
  Administered 2017-12-01: 10 mg via ORAL
  Filled 2017-12-01: qty 1

## 2017-12-01 MED ORDER — VIPERSLIDE LUBRICANT OPTIME
TOPICAL | Status: DC | PRN
  Administered 2017-12-01: 14:00:00 via SURGICAL_CAVITY

## 2017-12-01 MED ORDER — MIDAZOLAM HCL 2 MG/2ML IJ SOLN
INTRAMUSCULAR | Status: AC
  Filled 2017-12-01: qty 2

## 2017-12-01 MED ORDER — SODIUM CHLORIDE 0.9 % IV SOLN: 250.0000 mL | INTRAVENOUS | Status: DC | PRN

## 2017-12-01 MED ORDER — ACETAMINOPHEN 325 MG PO TABS: 650.0000 mg | ORAL_TABLET | ORAL | Status: DC | PRN

## 2017-12-01 MED ORDER — SODIUM CHLORIDE 0.9 % IV SOLN
250.0000 mL | INTRAVENOUS | Status: DC | PRN
Start: 2017-12-01 — End: 2017-12-02

## 2017-12-01 MED ORDER — NITROGLYCERIN IN D5W 200-5 MCG/ML-% IV SOLN
INTRAVENOUS | Status: AC
  Filled 2017-12-01: qty 250

## 2017-12-01 MED ORDER — ASPIRIN 81 MG PO CHEW
CHEWABLE_TABLET | ORAL | Status: AC
  Administered 2017-12-01: 81 mg
  Filled 2017-12-01: qty 1

## 2017-12-01 SURGICAL SUPPLY — 29 items
BAG SNAP BAND KOVER 36X36 (MISCELLANEOUS) ×3 IMPLANT
BALLN STERLING OTW 5X80X135 (BALLOONS) ×3
BALLOON STERLING OTW 5X80X135 (BALLOONS) ×2 IMPLANT
CATH OMNI FLUSH 5F 65CM (CATHETERS) ×3 IMPLANT
CATH QUICKCROSS SUPP .035X90CM (MICROCATHETER) ×3 IMPLANT
COVER DOME SNAP 22 D (MISCELLANEOUS) ×6 IMPLANT
COVER PRB 48X5XTLSCP FOLD TPE (BAG) ×2 IMPLANT
COVER PROBE 5X48 (BAG) ×1
DEVICE TORQUE H2O (MISCELLANEOUS) ×3 IMPLANT
DIAMONDBACK CLASSIC OAS 2.0MM (CATHETERS) ×3
GUIDEWIRE ANGLED .035X150CM (WIRE) ×3 IMPLANT
GUIDEWIRE LT ZIPWIRE 035X260 (WIRE) ×3 IMPLANT
KIT ENCORE 26 ADVANTAGE (KITS) ×3 IMPLANT
KIT MICROPUNCTURE NIT STIFF (SHEATH) ×3 IMPLANT
KIT PV (KITS) ×3 IMPLANT
LUBRICANT VIPERSLIDE CORONARY (MISCELLANEOUS) ×3 IMPLANT
SHEATH HIGHFLEX ANSEL 7FR 55CM (SHEATH) ×3 IMPLANT
SHEATH PINNACLE 5F 10CM (SHEATH) ×3 IMPLANT
SHIELD RADPAD SCOOP 12X17 (MISCELLANEOUS) ×3 IMPLANT
SYR MEDRAD MARK V 150ML (SYRINGE) ×3 IMPLANT
SYSTEM DIMODBCK CLSC OAS 2.0MM (CATHETERS) ×2 IMPLANT
TAPE VIPERTRACK RADIOPAQ (MISCELLANEOUS) ×2 IMPLANT
TAPE VIPERTRACK RADIOPAQUE (MISCELLANEOUS) ×1
TRANSDUCER W/STOPCOCK (MISCELLANEOUS) ×3 IMPLANT
TRAY PV CATH (CUSTOM PROCEDURE TRAY) ×3 IMPLANT
WIRE BENTSON .035X145CM (WIRE) ×3 IMPLANT
WIRE HITORQ VERSACORE ST 145CM (WIRE) IMPLANT
WIRE ROSEN-J .035X260CM (WIRE) ×3 IMPLANT
WIRE VIPER ADVANCE .017X335CM (WIRE) ×3 IMPLANT

## 2017-12-01 NOTE — Progress Notes (Signed)
Site area: RFA Site Prior to Removal:  Level 0 Pressure Applied For:45 min Manual:   yes Patient Status During Pull:  stable Post Pull Site:  Level 0 Post Pull Instructions Given:  yes Post Pull Pulses Present: BKA Dressing Applied:  Clear / gauze Bedrest begins @ 1740 till 2140 Comments: bled at 20 min

## 2017-12-01 NOTE — Telephone Encounter (Signed)
Sched appt 01/10/18 at 2:30. Pt's vm full. Lm on son Alan's # to inform them of appt.

## 2017-12-01 NOTE — Telephone Encounter (Signed)
-----   Message from Sharee Pimple, RN sent at 12/01/2017  3:12 PM EDT ----- Regarding: 4-6 weeks wound check   ----- Message ----- From: Nada Libman, MD Sent: 12/01/2017   2:56 PM To: Vvs Charge Pool  12-01-2017:  Surgeon:  Durene Cal Procedure Performed:  1.  Ultrasound-guided access, left femoral artery  2.  Abdominal aortogram  3.  Bilateral lower extremity runoff  4.  Atherectomy and angioplasty, right superficial femoral artery  5.  Conscious sedation (75 minutes)   Follow up 4-6 weeks for wound check

## 2017-12-01 NOTE — Discharge Instructions (Signed)
Restart coumadin tonight at home Angiogram, Care After This sheet gives you information about how to care for yourself after your procedure. Your health care provider may also give you more specific instructions. If you have problems or questions, contact your health care provider. What can I expect after the procedure? After the procedure, it is common to have bruising and tenderness at the catheter insertion area. Follow these instructions at home: Insertion site care  Follow instructions from your health care provider about how to take care of your insertion site. Make sure you: ? Wash your hands with soap and water before you change your bandage (dressing). If soap and water are not available, use hand sanitizer. ? Change your dressing as told by your health care provider. ? Leave stitches (sutures), skin glue, or adhesive strips in place. These skin closures may need to stay in place for 2 weeks or longer. If adhesive strip edges start to loosen and curl up, you may trim the loose edges. Do not remove adhesive strips completely unless your health care provider tells you to do that.  Do not take baths, swim, or use a hot tub until your health care provider approves.  You may shower 24-48 hours after the procedure or as told by your health care provider. ? Gently wash the site with plain soap and water. ? Pat the area dry with a clean towel. ? Do not rub the site. This may cause bleeding.  Do not apply powder or lotion to the site. Keep the site clean and dry.  Check your insertion site every day for signs of infection. Check for: ? Redness, swelling, or pain. ? Fluid or blood. ? Warmth. ? Pus or a bad smell. Activity  Rest as told by your health care provider, usually for 1-2 days.  Do not lift anything that is heavier than 10 lbs. (4.5 kg) or as told by your health care provider.  Do not drive for 24 hours if you were given a medicine to help you relax (sedative).  Do not  drive or use heavy machinery while taking prescription pain medicine. General instructions  Return to your normal activities as told by your health care provider, usually in about a week. Ask your health care provider what activities are safe for you.  If the catheter site starts bleeding, lie flat and put pressure on the site. If the bleeding does not stop, get help right away. This is a medical emergency.  Drink enough fluid to keep your urine clear or pale yellow. This helps flush the contrast dye from your body.  Take over-the-counter and prescription medicines only as told by your health care provider.  Keep all follow-up visits as told by your health care provider. This is important. Contact a health care provider if:  You have a fever or chills.  You have redness, swelling, or pain around your insertion site.  You have fluid or blood coming from your insertion site.  The insertion site feels warm to the touch.  You have pus or a bad smell coming from your insertion site.  You have bruising around the insertion site.  You notice blood collecting in the tissue around the catheter site (hematoma). The hematoma may be painful to the touch. Get help right away if:  You have severe pain at the catheter insertion area.  The catheter insertion area swells very fast.  The catheter insertion area is bleeding, and the bleeding does not stop when you hold steady  pressure on the area.  The area near or just beyond the catheter insertion site becomes pale, cool, tingly, or numb. These symptoms may represent a serious problem that is an emergency. Do not wait to see if the symptoms will go away. Get medical help right away. Call your local emergency services (911 in the [place] Do not drive yourself to the hospital. Summary  After the procedure, it is common to have bruising and tenderness at the catheter insertion area.  After the procedure, it is important to rest and drink plenty  of fluids.  Do not take baths, swim, or use a hot tub until your health care provider says it is okay to do so. You may shower 24-48 hours after the procedure or as told by your health care provider.  If the catheter site starts bleeding, lie flat and put pressure on the site. If the bleeding does not stop, get help right away. This is a medical emergency. This information is not intended to replace advice given to you by your health care provider. Make sure you discuss any questions you have with your health care provider. Document Released: 01/22/2005 Document Revised: 06/10/2016 Document Reviewed: 06/10/2016 Elsevier Interactive Patient Education  [employer].

## 2017-12-01 NOTE — Progress Notes (Signed)
Dr Myra Gianotti was paged/ pt to restart coumadin tonight.

## 2017-12-01 NOTE — Op Note (Signed)
Patient name: Jackie [name] MRN: [id number] DOB: Sep 02, 1934 Sex: female  12/01/2017 Pre-operative Diagnosis: Bilateral lower extremity ulcer Post-operative diagnosis:  Same Surgeon:  Annamarie Major Procedure Performed:  1.  Ultrasound-guided access, left femoral artery  2.  Abdominal aortogram  3.  Bilateral lower extremity runoff  4.  Atherectomy and angioplasty, right superficial femoral artery  5.  Conscious sedation (75 minutes)    Indications: The patient has a history of bilateral lower extremity ulcers.  She was able to heal the one on the right but required below-knee amputation on the left.  She now has a wound on her left below-knee amputation as well as on her right foot.  She is here today for further evaluation.  Procedure:  The patient was identified in the holding area and taken to room 8.  The patient was then placed supine on the table and prepped and draped in the usual sterile fashion.  A time out was called.  Ultrasound was used to evaluate the left common femoral artery.  It was patent .  A digital ultrasound image was acquired.  Conscious sedation was a minister with the use of IV fentanyl and Versed under continuous physician and nurse monitoring.  Heart rate, blood pressure, and oxygen saturations were continuously monitored.  A micropuncture needle was used to access the left common femoral artery under ultrasound guidance.  An 018 wire was advanced without resistance and a micropuncture sheath was placed.  The 018 wire was removed and a benson wire was placed.  The micropuncture sheath was exchanged for a 5 french sheath.  An omniflush catheter was advanced over the wire to the level of L-1.  An abdominal angiogram was obtained.  Next, using the omniflush catheter and a benson wire, the aortic bifurcation was crossed and the catheter was placed into theright external iliac artery and right runoff was obtained.  left runoff was performed via retrograde sheath  injections.  Findings:   Aortogram: No significant renal artery stenosis.  The infrarenal abdominal aorta is widely patent.  Bilateral common and external iliac arteries are widely patent but heavily calcified.  Right Lower Extremity: The right common femoral and profunda femoral artery are patent throughout their course.  The superficial femoral artery is heavily calcified.  In the midportion, there are 2 areas of greater than 90% stenosis.  The remaining portion of the above-knee popliteal and below-knee popliteal artery are heavily calcified but without significant stenosis.  There is occlusion of all 3 tibial vessels.  There is significant collaterals present.  Left Lower Extremity: Common femoral profunda and superficial femoral artery are patent and the visualized areas without significant stenosis  Intervention: After the above images were acquired the decision was made to proceed with intervention.  A Rosen wire was placed into the right superficial femoral artery and a 7 French 55 cm sheath was advanced into the right superficial femoral artery.  The patient was fully heparinized.  Next, using a 035 Glidewire and quick cross, the lesions in the superficial femoral artery were crossed.  Next, a Viper wire was placed.  I performed atherectomy using a CSI device.  This was done at low medium and high speeds.  Once this was completed, a 5 x 80 Sterling balloon was taken to 8 atm and held up for 2 minutes.  Completion imaging revealed resolution of the stenosis with residual less than 10% stenosis.  Runoff was then performed which showed no significant changes to preintervention.  The sheath  was withdrawn to the left external iliac artery.  The patient annually or for sheath pull once coagulation profile corrects.  Impression:  #1  High-grade, greater than 90% tandem stenoses within the right superficial femoral artery successfully treated with orbital atherectomy and a 5 mm angioplasty balloon with  residual stenosis less than 10%  #2  Occlusion of all 3 tibial vessels in the right leg with multiple collaterals present.  #3  No significant aortoiliac stenosis  #4  No correctable stenosis within the left common femoral profundofemoral artery    V. Annamarie Major, M.D. [facility] [place] Office: [phone] Pager:  [phone]

## 2017-12-02 ENCOUNTER — Encounter (HOSPITAL_COMMUNITY): Payer: Self-pay | Admitting: Surgery

## 2017-12-02 ENCOUNTER — Telehealth: Payer: Self-pay | Admitting: Podiatry

## 2017-12-02 DIAGNOSIS — E1151 Type 2 diabetes mellitus with diabetic peripheral angiopathy without gangrene: Secondary | ICD-10-CM | POA: Diagnosis not present

## 2017-12-02 DIAGNOSIS — I120 Hypertensive chronic kidney disease with stage 5 chronic kidney disease or end stage renal disease: Secondary | ICD-10-CM | POA: Diagnosis not present

## 2017-12-02 DIAGNOSIS — Z89512 Acquired absence of left leg below knee: Secondary | ICD-10-CM | POA: Diagnosis not present

## 2017-12-02 DIAGNOSIS — N186 End stage renal disease: Secondary | ICD-10-CM | POA: Diagnosis not present

## 2017-12-02 DIAGNOSIS — T82511D Breakdown (mechanical) of surgically created arteriovenous shunt, subsequent encounter: Secondary | ICD-10-CM | POA: Diagnosis not present

## 2017-12-02 DIAGNOSIS — E1122 Type 2 diabetes mellitus with diabetic chronic kidney disease: Secondary | ICD-10-CM | POA: Diagnosis not present

## 2017-12-02 DIAGNOSIS — S90821D Blister (nonthermal), right foot, subsequent encounter: Secondary | ICD-10-CM | POA: Diagnosis not present

## 2017-12-02 DIAGNOSIS — Z992 Dependence on renal dialysis: Secondary | ICD-10-CM | POA: Diagnosis not present

## 2017-12-02 LAB — POCT I-STAT, CHEM 8
BUN: 25 mg/dL — ABNORMAL HIGH (ref 6–20)
CHLORIDE: 96 mmol/L — AB (ref 101–111)
Calcium, Ion: 0.7 mmol/L — CL (ref 1.15–1.40)
Creatinine, Ser: 5.6 mg/dL — ABNORMAL HIGH (ref 0.44–1.00)
Glucose, Bld: 92 mg/dL (ref 65–99)
HEMATOCRIT: 43 % (ref 36.0–46.0)
Hemoglobin: 14.6 g/dL (ref 12.0–15.0)
POTASSIUM: 7.3 mmol/L — AB (ref 3.5–5.1)
SODIUM: 136 mmol/L (ref 135–145)
TCO2: 35 mmol/L — ABNORMAL HIGH (ref 22–32)

## 2017-12-02 NOTE — Telephone Encounter (Signed)
This is Jackie [name] with [facility]. I'm here with Jackie [name] and I'm trying to see if we might be able to get an updated wound care order. She said she had a blister that was lanced about three weeks ago. She has a large, its about 7x6 open wound on the dorsal surface of her right foot which is her only foot. It is pink, it does drain, it does have smaller blisters within the wound bed. The only order we have is for a dry dressing. I wanted to know what more can she have or what can we do? Please give me a call at [phone]. Thank you.

## 2017-12-02 NOTE — Progress Notes (Signed)
Pt called today letting us know that the wheel chair she brought was not the same as she went home in. Her nephew Casimiro Needle was the person in charge of her belongings but left before she was back to short stay, Her son came to be with her and could not find her chair at first but searched further and brought what appeared to be her chair. I did note that there was not an ID tag on it and asked him if he pulled it off and he stated no. I got her ready for her procedure and I placed and ID tag on the push handle of the chair and the pt confirmed that also. [facility] cath lab Chiropodist was updated. Pt states her chair is smaller/ 16lbs, black pad, no foot rests and taped up arm pads. Please see incident report.

## 2017-12-03 ENCOUNTER — Telehealth: Payer: Self-pay | Admitting: *Deleted

## 2017-12-03 NOTE — Telephone Encounter (Signed)
Dr. Samuella Cota ordered Medihoney to right foot and cover with dry dressing qod. Orders faxed to [facility].

## 2017-12-04 DIAGNOSIS — D509 Iron deficiency anemia, unspecified: Secondary | ICD-10-CM | POA: Diagnosis not present

## 2017-12-04 DIAGNOSIS — E1129 Type 2 diabetes mellitus with other diabetic kidney complication: Secondary | ICD-10-CM | POA: Diagnosis not present

## 2017-12-04 DIAGNOSIS — N186 End stage renal disease: Secondary | ICD-10-CM | POA: Diagnosis not present

## 2017-12-04 DIAGNOSIS — N2581 Secondary hyperparathyroidism of renal origin: Secondary | ICD-10-CM | POA: Diagnosis not present

## 2017-12-04 DIAGNOSIS — L03115 Cellulitis of right lower limb: Secondary | ICD-10-CM | POA: Diagnosis not present

## 2017-12-05 DIAGNOSIS — I120 Hypertensive chronic kidney disease with stage 5 chronic kidney disease or end stage renal disease: Secondary | ICD-10-CM | POA: Diagnosis not present

## 2017-12-05 DIAGNOSIS — S90821D Blister (nonthermal), right foot, subsequent encounter: Secondary | ICD-10-CM | POA: Diagnosis not present

## 2017-12-05 DIAGNOSIS — E1122 Type 2 diabetes mellitus with diabetic chronic kidney disease: Secondary | ICD-10-CM | POA: Diagnosis not present

## 2017-12-05 DIAGNOSIS — N186 End stage renal disease: Secondary | ICD-10-CM | POA: Diagnosis not present

## 2017-12-05 DIAGNOSIS — E1151 Type 2 diabetes mellitus with diabetic peripheral angiopathy without gangrene: Secondary | ICD-10-CM | POA: Diagnosis not present

## 2017-12-05 DIAGNOSIS — Z794 Long term (current) use of insulin: Secondary | ICD-10-CM | POA: Diagnosis not present

## 2017-12-07 DIAGNOSIS — L03115 Cellulitis of right lower limb: Secondary | ICD-10-CM | POA: Diagnosis not present

## 2017-12-07 DIAGNOSIS — E1129 Type 2 diabetes mellitus with other diabetic kidney complication: Secondary | ICD-10-CM | POA: Diagnosis not present

## 2017-12-07 DIAGNOSIS — N2581 Secondary hyperparathyroidism of renal origin: Secondary | ICD-10-CM | POA: Diagnosis not present

## 2017-12-07 DIAGNOSIS — N186 End stage renal disease: Secondary | ICD-10-CM | POA: Diagnosis not present

## 2017-12-07 DIAGNOSIS — D509 Iron deficiency anemia, unspecified: Secondary | ICD-10-CM | POA: Diagnosis not present

## 2017-12-09 ENCOUNTER — Telehealth: Payer: Self-pay | Admitting: Podiatry

## 2017-12-09 DIAGNOSIS — D509 Iron deficiency anemia, unspecified: Secondary | ICD-10-CM | POA: Diagnosis not present

## 2017-12-09 DIAGNOSIS — L03115 Cellulitis of right lower limb: Secondary | ICD-10-CM | POA: Diagnosis not present

## 2017-12-09 DIAGNOSIS — N2581 Secondary hyperparathyroidism of renal origin: Secondary | ICD-10-CM | POA: Diagnosis not present

## 2017-12-09 DIAGNOSIS — E1129 Type 2 diabetes mellitus with other diabetic kidney complication: Secondary | ICD-10-CM | POA: Diagnosis not present

## 2017-12-09 DIAGNOSIS — N186 End stage renal disease: Secondary | ICD-10-CM | POA: Diagnosis not present

## 2017-12-09 NOTE — Telephone Encounter (Signed)
I informed pt of 4 diagnosis codes of 11-22-17.

## 2017-12-09 NOTE — Telephone Encounter (Signed)
This Jackie [name] calling from [facility] in [place]. I'm calling to get an official diagnosis of the wounds on her feet. I do the coding here at [facility]. If someone could give me a call back at [phone]. Thank you.

## 2017-12-11 DIAGNOSIS — N2581 Secondary hyperparathyroidism of renal origin: Secondary | ICD-10-CM | POA: Diagnosis not present

## 2017-12-11 DIAGNOSIS — D509 Iron deficiency anemia, unspecified: Secondary | ICD-10-CM | POA: Diagnosis not present

## 2017-12-11 DIAGNOSIS — E1129 Type 2 diabetes mellitus with other diabetic kidney complication: Secondary | ICD-10-CM | POA: Diagnosis not present

## 2017-12-11 DIAGNOSIS — N186 End stage renal disease: Secondary | ICD-10-CM | POA: Diagnosis not present

## 2017-12-11 DIAGNOSIS — L03115 Cellulitis of right lower limb: Secondary | ICD-10-CM | POA: Diagnosis not present

## 2017-12-14 DIAGNOSIS — N2581 Secondary hyperparathyroidism of renal origin: Secondary | ICD-10-CM | POA: Diagnosis not present

## 2017-12-14 DIAGNOSIS — L03115 Cellulitis of right lower limb: Secondary | ICD-10-CM | POA: Diagnosis not present

## 2017-12-14 DIAGNOSIS — D509 Iron deficiency anemia, unspecified: Secondary | ICD-10-CM | POA: Diagnosis not present

## 2017-12-14 DIAGNOSIS — E1129 Type 2 diabetes mellitus with other diabetic kidney complication: Secondary | ICD-10-CM | POA: Diagnosis not present

## 2017-12-14 DIAGNOSIS — N186 End stage renal disease: Secondary | ICD-10-CM | POA: Diagnosis not present

## 2017-12-15 ENCOUNTER — Ambulatory Visit (INDEPENDENT_AMBULATORY_CARE_PROVIDER_SITE_OTHER): Payer: Medicare Other | Admitting: Pharmacist Clinician (PhC)/ Clinical Pharmacy Specialist

## 2017-12-15 DIAGNOSIS — Z7901 Long term (current) use of anticoagulants: Secondary | ICD-10-CM

## 2017-12-15 DIAGNOSIS — I4891 Unspecified atrial fibrillation: Secondary | ICD-10-CM

## 2017-12-15 LAB — POCT INR: INR: 1.7 — AB (ref 2.0–3.0)

## 2017-12-15 NOTE — Patient Instructions (Signed)
Description   Increase dose to 1.5 tablets daily and 1 tablet each Tuesday and Saturday.   Repeat INR in 2 weeks

## 2017-12-16 DIAGNOSIS — N186 End stage renal disease: Secondary | ICD-10-CM | POA: Diagnosis not present

## 2017-12-16 DIAGNOSIS — N2581 Secondary hyperparathyroidism of renal origin: Secondary | ICD-10-CM | POA: Diagnosis not present

## 2017-12-16 DIAGNOSIS — L03115 Cellulitis of right lower limb: Secondary | ICD-10-CM | POA: Diagnosis not present

## 2017-12-16 DIAGNOSIS — E1129 Type 2 diabetes mellitus with other diabetic kidney complication: Secondary | ICD-10-CM | POA: Diagnosis not present

## 2017-12-16 DIAGNOSIS — D509 Iron deficiency anemia, unspecified: Secondary | ICD-10-CM | POA: Diagnosis not present

## 2017-12-17 ENCOUNTER — Telehealth: Payer: Self-pay | Admitting: *Deleted

## 2017-12-17 NOTE — Telephone Encounter (Signed)
Pt's [facility] nurse states pt refused all wound care visits this week.

## 2017-12-18 DIAGNOSIS — Z992 Dependence on renal dialysis: Secondary | ICD-10-CM | POA: Diagnosis not present

## 2017-12-18 DIAGNOSIS — Z794 Long term (current) use of insulin: Secondary | ICD-10-CM | POA: Diagnosis not present

## 2017-12-18 DIAGNOSIS — I120 Hypertensive chronic kidney disease with stage 5 chronic kidney disease or end stage renal disease: Secondary | ICD-10-CM | POA: Diagnosis not present

## 2017-12-18 DIAGNOSIS — N186 End stage renal disease: Secondary | ICD-10-CM | POA: Diagnosis not present

## 2017-12-18 DIAGNOSIS — E1129 Type 2 diabetes mellitus with other diabetic kidney complication: Secondary | ICD-10-CM | POA: Diagnosis not present

## 2017-12-18 DIAGNOSIS — D509 Iron deficiency anemia, unspecified: Secondary | ICD-10-CM | POA: Diagnosis not present

## 2017-12-18 DIAGNOSIS — E1122 Type 2 diabetes mellitus with diabetic chronic kidney disease: Secondary | ICD-10-CM | POA: Diagnosis not present

## 2017-12-18 DIAGNOSIS — N2581 Secondary hyperparathyroidism of renal origin: Secondary | ICD-10-CM | POA: Diagnosis not present

## 2017-12-18 DIAGNOSIS — S90821D Blister (nonthermal), right foot, subsequent encounter: Secondary | ICD-10-CM | POA: Diagnosis not present

## 2017-12-18 DIAGNOSIS — E1151 Type 2 diabetes mellitus with diabetic peripheral angiopathy without gangrene: Secondary | ICD-10-CM | POA: Diagnosis not present

## 2017-12-20 DIAGNOSIS — I120 Hypertensive chronic kidney disease with stage 5 chronic kidney disease or end stage renal disease: Secondary | ICD-10-CM | POA: Diagnosis not present

## 2017-12-20 DIAGNOSIS — N186 End stage renal disease: Secondary | ICD-10-CM | POA: Diagnosis not present

## 2017-12-20 DIAGNOSIS — E1151 Type 2 diabetes mellitus with diabetic peripheral angiopathy without gangrene: Secondary | ICD-10-CM | POA: Diagnosis not present

## 2017-12-20 DIAGNOSIS — E1122 Type 2 diabetes mellitus with diabetic chronic kidney disease: Secondary | ICD-10-CM | POA: Diagnosis not present

## 2017-12-20 DIAGNOSIS — S90821D Blister (nonthermal), right foot, subsequent encounter: Secondary | ICD-10-CM | POA: Diagnosis not present

## 2017-12-20 DIAGNOSIS — Z794 Long term (current) use of insulin: Secondary | ICD-10-CM | POA: Diagnosis not present

## 2017-12-21 DIAGNOSIS — D509 Iron deficiency anemia, unspecified: Secondary | ICD-10-CM | POA: Diagnosis not present

## 2017-12-21 DIAGNOSIS — N186 End stage renal disease: Secondary | ICD-10-CM | POA: Diagnosis not present

## 2017-12-21 DIAGNOSIS — N2581 Secondary hyperparathyroidism of renal origin: Secondary | ICD-10-CM | POA: Diagnosis not present

## 2017-12-21 DIAGNOSIS — E1129 Type 2 diabetes mellitus with other diabetic kidney complication: Secondary | ICD-10-CM | POA: Diagnosis not present

## 2017-12-22 DIAGNOSIS — S90821D Blister (nonthermal), right foot, subsequent encounter: Secondary | ICD-10-CM | POA: Diagnosis not present

## 2017-12-22 DIAGNOSIS — E1122 Type 2 diabetes mellitus with diabetic chronic kidney disease: Secondary | ICD-10-CM | POA: Diagnosis not present

## 2017-12-22 DIAGNOSIS — E1151 Type 2 diabetes mellitus with diabetic peripheral angiopathy without gangrene: Secondary | ICD-10-CM | POA: Diagnosis not present

## 2017-12-22 DIAGNOSIS — N186 End stage renal disease: Secondary | ICD-10-CM | POA: Diagnosis not present

## 2017-12-22 DIAGNOSIS — Z794 Long term (current) use of insulin: Secondary | ICD-10-CM | POA: Diagnosis not present

## 2017-12-22 DIAGNOSIS — I120 Hypertensive chronic kidney disease with stage 5 chronic kidney disease or end stage renal disease: Secondary | ICD-10-CM | POA: Diagnosis not present

## 2017-12-23 DIAGNOSIS — E1129 Type 2 diabetes mellitus with other diabetic kidney complication: Secondary | ICD-10-CM | POA: Diagnosis not present

## 2017-12-23 DIAGNOSIS — N186 End stage renal disease: Secondary | ICD-10-CM | POA: Diagnosis not present

## 2017-12-23 DIAGNOSIS — D509 Iron deficiency anemia, unspecified: Secondary | ICD-10-CM | POA: Diagnosis not present

## 2017-12-23 DIAGNOSIS — N2581 Secondary hyperparathyroidism of renal origin: Secondary | ICD-10-CM | POA: Diagnosis not present

## 2017-12-25 DIAGNOSIS — E1129 Type 2 diabetes mellitus with other diabetic kidney complication: Secondary | ICD-10-CM | POA: Diagnosis not present

## 2017-12-25 DIAGNOSIS — D509 Iron deficiency anemia, unspecified: Secondary | ICD-10-CM | POA: Diagnosis not present

## 2017-12-25 DIAGNOSIS — N186 End stage renal disease: Secondary | ICD-10-CM | POA: Diagnosis not present

## 2017-12-25 DIAGNOSIS — N2581 Secondary hyperparathyroidism of renal origin: Secondary | ICD-10-CM | POA: Diagnosis not present

## 2017-12-28 DIAGNOSIS — N2581 Secondary hyperparathyroidism of renal origin: Secondary | ICD-10-CM | POA: Diagnosis not present

## 2017-12-28 DIAGNOSIS — D509 Iron deficiency anemia, unspecified: Secondary | ICD-10-CM | POA: Diagnosis not present

## 2017-12-28 DIAGNOSIS — E1129 Type 2 diabetes mellitus with other diabetic kidney complication: Secondary | ICD-10-CM | POA: Diagnosis not present

## 2017-12-28 DIAGNOSIS — N186 End stage renal disease: Secondary | ICD-10-CM | POA: Diagnosis not present

## 2017-12-29 DIAGNOSIS — S90821D Blister (nonthermal), right foot, subsequent encounter: Secondary | ICD-10-CM | POA: Diagnosis not present

## 2017-12-29 DIAGNOSIS — Z794 Long term (current) use of insulin: Secondary | ICD-10-CM | POA: Diagnosis not present

## 2017-12-29 DIAGNOSIS — E1122 Type 2 diabetes mellitus with diabetic chronic kidney disease: Secondary | ICD-10-CM | POA: Diagnosis not present

## 2017-12-29 DIAGNOSIS — E1151 Type 2 diabetes mellitus with diabetic peripheral angiopathy without gangrene: Secondary | ICD-10-CM | POA: Diagnosis not present

## 2017-12-29 DIAGNOSIS — I120 Hypertensive chronic kidney disease with stage 5 chronic kidney disease or end stage renal disease: Secondary | ICD-10-CM | POA: Diagnosis not present

## 2017-12-29 DIAGNOSIS — N186 End stage renal disease: Secondary | ICD-10-CM | POA: Diagnosis not present

## 2017-12-30 DIAGNOSIS — N186 End stage renal disease: Secondary | ICD-10-CM | POA: Diagnosis not present

## 2017-12-30 DIAGNOSIS — D509 Iron deficiency anemia, unspecified: Secondary | ICD-10-CM | POA: Diagnosis not present

## 2017-12-30 DIAGNOSIS — E1129 Type 2 diabetes mellitus with other diabetic kidney complication: Secondary | ICD-10-CM | POA: Diagnosis not present

## 2017-12-30 DIAGNOSIS — N2581 Secondary hyperparathyroidism of renal origin: Secondary | ICD-10-CM | POA: Diagnosis not present

## 2017-12-31 DIAGNOSIS — Z794 Long term (current) use of insulin: Secondary | ICD-10-CM | POA: Diagnosis not present

## 2017-12-31 DIAGNOSIS — E1151 Type 2 diabetes mellitus with diabetic peripheral angiopathy without gangrene: Secondary | ICD-10-CM | POA: Diagnosis not present

## 2017-12-31 DIAGNOSIS — E1122 Type 2 diabetes mellitus with diabetic chronic kidney disease: Secondary | ICD-10-CM | POA: Diagnosis not present

## 2017-12-31 DIAGNOSIS — S90821D Blister (nonthermal), right foot, subsequent encounter: Secondary | ICD-10-CM | POA: Diagnosis not present

## 2017-12-31 DIAGNOSIS — N186 End stage renal disease: Secondary | ICD-10-CM | POA: Diagnosis not present

## 2017-12-31 DIAGNOSIS — I120 Hypertensive chronic kidney disease with stage 5 chronic kidney disease or end stage renal disease: Secondary | ICD-10-CM | POA: Diagnosis not present

## 2018-01-01 DIAGNOSIS — E1129 Type 2 diabetes mellitus with other diabetic kidney complication: Secondary | ICD-10-CM | POA: Diagnosis not present

## 2018-01-01 DIAGNOSIS — D509 Iron deficiency anemia, unspecified: Secondary | ICD-10-CM | POA: Diagnosis not present

## 2018-01-01 DIAGNOSIS — N186 End stage renal disease: Secondary | ICD-10-CM | POA: Diagnosis not present

## 2018-01-01 DIAGNOSIS — N2581 Secondary hyperparathyroidism of renal origin: Secondary | ICD-10-CM | POA: Diagnosis not present

## 2018-01-03 DIAGNOSIS — I120 Hypertensive chronic kidney disease with stage 5 chronic kidney disease or end stage renal disease: Secondary | ICD-10-CM | POA: Diagnosis not present

## 2018-01-03 DIAGNOSIS — E1151 Type 2 diabetes mellitus with diabetic peripheral angiopathy without gangrene: Secondary | ICD-10-CM | POA: Diagnosis not present

## 2018-01-03 DIAGNOSIS — N186 End stage renal disease: Secondary | ICD-10-CM | POA: Diagnosis not present

## 2018-01-03 DIAGNOSIS — S90821D Blister (nonthermal), right foot, subsequent encounter: Secondary | ICD-10-CM | POA: Diagnosis not present

## 2018-01-03 DIAGNOSIS — E1122 Type 2 diabetes mellitus with diabetic chronic kidney disease: Secondary | ICD-10-CM | POA: Diagnosis not present

## 2018-01-03 DIAGNOSIS — Z794 Long term (current) use of insulin: Secondary | ICD-10-CM | POA: Diagnosis not present

## 2018-01-04 DIAGNOSIS — D509 Iron deficiency anemia, unspecified: Secondary | ICD-10-CM | POA: Diagnosis not present

## 2018-01-04 DIAGNOSIS — N186 End stage renal disease: Secondary | ICD-10-CM | POA: Diagnosis not present

## 2018-01-04 DIAGNOSIS — E1129 Type 2 diabetes mellitus with other diabetic kidney complication: Secondary | ICD-10-CM | POA: Diagnosis not present

## 2018-01-04 DIAGNOSIS — N2581 Secondary hyperparathyroidism of renal origin: Secondary | ICD-10-CM | POA: Diagnosis not present

## 2018-01-06 DIAGNOSIS — N186 End stage renal disease: Secondary | ICD-10-CM | POA: Diagnosis not present

## 2018-01-06 DIAGNOSIS — D509 Iron deficiency anemia, unspecified: Secondary | ICD-10-CM | POA: Diagnosis not present

## 2018-01-06 DIAGNOSIS — E1129 Type 2 diabetes mellitus with other diabetic kidney complication: Secondary | ICD-10-CM | POA: Diagnosis not present

## 2018-01-06 DIAGNOSIS — N2581 Secondary hyperparathyroidism of renal origin: Secondary | ICD-10-CM | POA: Diagnosis not present

## 2018-01-07 ENCOUNTER — Ambulatory Visit (INDEPENDENT_AMBULATORY_CARE_PROVIDER_SITE_OTHER): Payer: Medicare Other | Admitting: Podiatry

## 2018-01-07 DIAGNOSIS — S91301D Unspecified open wound, right foot, subsequent encounter: Secondary | ICD-10-CM

## 2018-01-07 DIAGNOSIS — I739 Peripheral vascular disease, unspecified: Secondary | ICD-10-CM | POA: Diagnosis not present

## 2018-01-07 DIAGNOSIS — I96 Gangrene, not elsewhere classified: Secondary | ICD-10-CM | POA: Diagnosis not present

## 2018-01-07 NOTE — Progress Notes (Signed)
  Subjective:  Patient ID: Jackie [name], female    DOB: 02/04/1935,  MRN: [id number]  Chief Complaint  Patient presents with  . Foot Ulcer    top of right foot - doing better - patient is amazed at the medihoney   83 y.o. female returns for foot care.  Is using meta honey and states that the wound is doing better.  Has an appointment with vascular on Monday  Objective:  There were no vitals filed for this visit. General AA&O x3. Normal mood and affect.  Vascular Pedal pulses nonpalpable foot cool to touch..  Absent capillary refill  Neurologic Epicritic sensation grossly intact.  Sensation intact all digits right foot  Dermatologic  Blistered area healing well with only residual open area post debridement of the hypertrophic callus.  Wound base fibro-granular with hyperkeratotic rim.  Right fifth toe without active purulence however signs of early gangrene  Orthopedic:  History left BKA Right foot hammertoe deformities   Assessment & Plan:  Patient was evaluated and treated and all questions answered.  Blister R Foot, in the setting of PAD -Healing well despite significant peripheral arterial disease.  Continue medihoney excisional debridement of the lesion today performed see pre-and post images above.  PAD -Has appointment with vascular on Monday.  Concern for high risk of amputation of the right fifth toe.  Patient is dysvascular appearing foot type though her most recent blister healing does show some signs of healing  Return in about 1 week (around 01/14/2018) for Wound Care.

## 2018-01-08 DIAGNOSIS — N186 End stage renal disease: Secondary | ICD-10-CM | POA: Diagnosis not present

## 2018-01-08 DIAGNOSIS — D509 Iron deficiency anemia, unspecified: Secondary | ICD-10-CM | POA: Diagnosis not present

## 2018-01-08 DIAGNOSIS — E1129 Type 2 diabetes mellitus with other diabetic kidney complication: Secondary | ICD-10-CM | POA: Diagnosis not present

## 2018-01-08 DIAGNOSIS — N2581 Secondary hyperparathyroidism of renal origin: Secondary | ICD-10-CM | POA: Diagnosis not present

## 2018-01-10 ENCOUNTER — Ambulatory Visit (INDEPENDENT_AMBULATORY_CARE_PROVIDER_SITE_OTHER): Payer: Medicare Other | Admitting: Surgery

## 2018-01-10 ENCOUNTER — Encounter: Payer: Self-pay | Admitting: Surgery

## 2018-01-10 VITALS — BP 139/77 | HR 77 | Temp 98.5°F | Ht 69.0 in

## 2018-01-10 DIAGNOSIS — I7025 Atherosclerosis of native arteries of other extremities with ulceration: Secondary | ICD-10-CM

## 2018-01-10 NOTE — Progress Notes (Signed)
[facility] [place]  Patient name: Jackie [name] MRN: [id number] DOB: 05-16-1935 Sex: female   REASON FOR VISIT:    Follow up  [place]:    Jackie [name] is a 83 y.o. female with end-stage renal disease who I met in 2016 when she presented with bilateral lower extremity ulcers.  I felt that she was at high risk for bilateral amputation based on the appearance of her wounds.   She underwent an attempted revascularization on the right but was unsuccessful.  She went for wound care and was able to heal the wounds on her right side.  She ultimately underwent below-knee amputation by Dr. Sharol Given to on the left.  The patient recently developed blisters and draiange on her right foot. She also has had some drainage from her below-knee amputation site on the left.  She underwent angiography on 12/01/2017 and had atherectomy with angioplasty of her right superficial femoral artery.  She states her wounds are improving and being managed by the wound  center   PAST MEDICAL HISTORY:   Past Medical History:  Diagnosis Date  . Anemia   . Arthritis   . Diabetes mellitus   . End-stage renal disease on hemodialysis ([place])    dialysis T/Th/Sa  . Hyperlipidemia   . Hypertension   . Pneumonia   . Thyroid disease    pt denies this     FAMILY HISTORY:   Family History  Problem Relation Age of Onset  . Diabetes Mother   . Heart disease Mother   . Hypertension Mother     SOCIAL HISTORY:   Social History   Tobacco Use  . Smoking status: Never Smoker  . Smokeless tobacco: Never Used  Substance Use Topics  . Alcohol use: No     ALLERGIES:   No Known Allergies   CURRENT MEDICATIONS:   Current Outpatient Medications  Medication Sig Dispense Refill  . cinacalcet (SENSIPAR) 30 MG tablet Take 30 mg by mouth daily.    Marland Kitchen doxycycline (VIBRAMYCIN) 100 MG capsule Take 1 capsule (100 mg total) by mouth 2 (two)  times daily. 14 capsule 0  . insulin detemir (LEVEMIR) 100 UNIT/ML injection Inject 0.1 mLs (10 Units total) into the skin at bedtime. (Patient taking differently: Inject 15 Units into the skin at bedtime. ) 10 mL 11  . lidocaine-prilocaine (EMLA) cream Apply 1 application topically daily as needed (prior to dialysis).   11  . metoprolol tartrate (LOPRESSOR) 50 MG tablet Take 50 mg by mouth 2 (two) times daily.    . promethazine (PHENERGAN) 12.5 MG tablet Take 12.5 mg by mouth daily as needed for nausea or vomiting.     . sevelamer carbonate (RENVELA) 2.4 g PACK Take 2.4 g by mouth 3 (three) times daily with meals.     . warfarin (COUMADIN) 5 MG tablet TAKE 1 TO 1 AND 1/2 TABLETS BY MOUTH DAILY AS DIRECTED BY COUMADIN CLINIC (Patient taking differently: Take 5-7.5 mg by mouth See admin instructions. Take 37m by mouth on dialysis days (Tuesday, Thursday, and Saturday) take 7.585mon all other days) 45 tablet 1  . albuterol (PROVENTIL HFA;VENTOLIN HFA) 108 (90 BASE) MCG/ACT inhaler Inhale 1-2 puffs into the lungs every 6 (six) hours as needed for wheezing. (Patient not taking: Reported on 11/26/2017) 1 Inhaler 0  . cefTAZidime 2 g in dextrose 5 % 50 mL Inject 2 g into the vein Every Tuesday,Thursday,and Saturday with dialysis. (Patient not taking: Reported on  11/26/2017)    . cephALEXin (KEFLEX) 500 MG capsule Take 1 capsule (500 mg total) by mouth daily. On the day of your dialysis please take the Keflex after the dialysis session (Patient not taking: Reported on 11/26/2017) 7 capsule 0  . clopidogrel (PLAVIX) 75 MG tablet TAKE ONE TABLET   BY MOUTH   DAILY (Patient not taking: Reported on 11/26/2017) 30 tablet 9  . oxyCODONE (ROXICODONE) 5 MG immediate release tablet Take 1 tablet (5 mg total) by mouth every 4 (four) hours as needed for severe pain. (Patient not taking: Reported on 11/26/2017) 20 tablet 0  . oxyCODONE-acetaminophen (ROXICET) 5-325 MG tablet Take 1 tablet by mouth every 4 (four) hours as needed  for severe pain. (Patient not taking: Reported on 11/26/2017) 60 tablet 0  . saccharomyces boulardii (FLORASTOR) 250 MG capsule Take 1 capsule (250 mg total) by mouth 2 (two) times daily. (Patient not taking: Reported on 11/26/2017) 30 capsule 0   No current facility-administered medications for this visit.     REVIEW OF SYSTEMS:   _0  denotes positive finding, _1  denotes negative finding Cardiac  Comments:  Chest pain or chest pressure:    Shortness of breath upon exertion:    Short of breath when lying flat:    Irregular heart rhythm:        Vascular    Pain in calf, thigh, or hip brought on by ambulation:    Pain in feet at night that wakes you up from your sleep:     Blood clot in your veins:    Leg swelling:         Pulmonary    Oxygen at home:    Productive cough:     Wheezing:         Neurologic    Sudden weakness in arms or legs:     Sudden numbness in arms or legs:     Sudden onset of difficulty speaking or slurred speech:    Temporary loss of vision in one eye:     Problems with dizziness:         Gastrointestinal    Blood in stool:     Vomited blood:         Genitourinary    Burning when urinating:     Blood in urine:        Psychiatric    Major depression:         Hematologic    Bleeding problems:    Problems with blood clotting too easily:        Skin    Rashes or ulcers: x       Constitutional    Fever or chills:      xPHYSICAL EXAM:   Vitals:   01/10/18 1444  BP: 139/77  Pulse: 77  Temp: 98.5 F (36.9 C)  TempSrc: Oral  SpO2: 96%  Height: _2  (1.753 m)    GENERAL: The patient is a well-nourished female, in no acute distress. The vital signs are documented above. CARDIAC: There is a regular rate and rhythm.  VASCULAR: Nonpalpable pedal pulse PULMONARY: Non-labored respirations MUSCULOSKELETAL: There are no major deformities or cyanosis. NEUROLOGIC: No focal weakness or paresthesias are detected. SKIN: Right foot is wrapped.   Left below-knee stump with dry eschar no drainage. PSYCHIATRIC: The patient has a normal affect.  STUDIES:   None  MEDICAL ISSUES:   I discussed with the patient that her blood flow is as good as we can get it  on the left, and therefore if she has issues with nonhealing wounds on her left below-knee amputation stump, she will need revision.  She tells me that her wounds on the right foot are improving.  She has had revascularization of the right leg through treatment of the superficial femoral artery near occlusion.  She has occlusion of all 3 tibial vessels.  She potentially could be a candidate for posterior tibial artery revascularization if she is unable to heal her wounds.  I will schedule her to come back in 3 months for a wound check with vascular lab studies.    Annamarie Major, MD [facility] [phone] Pager [phone]

## 2018-01-11 DIAGNOSIS — D509 Iron deficiency anemia, unspecified: Secondary | ICD-10-CM | POA: Diagnosis not present

## 2018-01-11 DIAGNOSIS — E1129 Type 2 diabetes mellitus with other diabetic kidney complication: Secondary | ICD-10-CM | POA: Diagnosis not present

## 2018-01-11 DIAGNOSIS — N186 End stage renal disease: Secondary | ICD-10-CM | POA: Diagnosis not present

## 2018-01-11 DIAGNOSIS — N2581 Secondary hyperparathyroidism of renal origin: Secondary | ICD-10-CM | POA: Diagnosis not present

## 2018-01-12 DIAGNOSIS — Z794 Long term (current) use of insulin: Secondary | ICD-10-CM | POA: Diagnosis not present

## 2018-01-12 DIAGNOSIS — E1122 Type 2 diabetes mellitus with diabetic chronic kidney disease: Secondary | ICD-10-CM | POA: Diagnosis not present

## 2018-01-12 DIAGNOSIS — E1151 Type 2 diabetes mellitus with diabetic peripheral angiopathy without gangrene: Secondary | ICD-10-CM | POA: Diagnosis not present

## 2018-01-12 DIAGNOSIS — N186 End stage renal disease: Secondary | ICD-10-CM | POA: Diagnosis not present

## 2018-01-12 DIAGNOSIS — I120 Hypertensive chronic kidney disease with stage 5 chronic kidney disease or end stage renal disease: Secondary | ICD-10-CM | POA: Diagnosis not present

## 2018-01-12 DIAGNOSIS — S90821D Blister (nonthermal), right foot, subsequent encounter: Secondary | ICD-10-CM | POA: Diagnosis not present

## 2018-01-13 DIAGNOSIS — N2581 Secondary hyperparathyroidism of renal origin: Secondary | ICD-10-CM | POA: Diagnosis not present

## 2018-01-13 DIAGNOSIS — D509 Iron deficiency anemia, unspecified: Secondary | ICD-10-CM | POA: Diagnosis not present

## 2018-01-13 DIAGNOSIS — E1129 Type 2 diabetes mellitus with other diabetic kidney complication: Secondary | ICD-10-CM | POA: Diagnosis not present

## 2018-01-13 DIAGNOSIS — N186 End stage renal disease: Secondary | ICD-10-CM | POA: Diagnosis not present

## 2018-01-14 ENCOUNTER — Ambulatory Visit: Payer: Medicare Other | Admitting: Podiatry

## 2018-01-15 DIAGNOSIS — E1129 Type 2 diabetes mellitus with other diabetic kidney complication: Secondary | ICD-10-CM | POA: Diagnosis not present

## 2018-01-15 DIAGNOSIS — D509 Iron deficiency anemia, unspecified: Secondary | ICD-10-CM | POA: Diagnosis not present

## 2018-01-15 DIAGNOSIS — N186 End stage renal disease: Secondary | ICD-10-CM | POA: Diagnosis not present

## 2018-01-15 DIAGNOSIS — N2581 Secondary hyperparathyroidism of renal origin: Secondary | ICD-10-CM | POA: Diagnosis not present

## 2018-01-17 ENCOUNTER — Other Ambulatory Visit: Payer: Self-pay | Admitting: Internal Medicine

## 2018-01-17 DIAGNOSIS — Z992 Dependence on renal dialysis: Secondary | ICD-10-CM | POA: Diagnosis not present

## 2018-01-17 DIAGNOSIS — N186 End stage renal disease: Secondary | ICD-10-CM | POA: Diagnosis not present

## 2018-01-17 DIAGNOSIS — E1122 Type 2 diabetes mellitus with diabetic chronic kidney disease: Secondary | ICD-10-CM | POA: Diagnosis not present

## 2018-01-17 DIAGNOSIS — S91301A Unspecified open wound, right foot, initial encounter: Secondary | ICD-10-CM | POA: Diagnosis not present

## 2018-01-17 DIAGNOSIS — Z89512 Acquired absence of left leg below knee: Secondary | ICD-10-CM | POA: Diagnosis not present

## 2018-01-17 DIAGNOSIS — E1129 Type 2 diabetes mellitus with other diabetic kidney complication: Secondary | ICD-10-CM | POA: Diagnosis not present

## 2018-01-17 NOTE — Telephone Encounter (Signed)
Order sent to dialysis. INR on 01-18-18

## 2018-01-18 DIAGNOSIS — D631 Anemia in chronic kidney disease: Secondary | ICD-10-CM | POA: Diagnosis not present

## 2018-01-18 DIAGNOSIS — E1129 Type 2 diabetes mellitus with other diabetic kidney complication: Secondary | ICD-10-CM | POA: Diagnosis not present

## 2018-01-18 DIAGNOSIS — N186 End stage renal disease: Secondary | ICD-10-CM | POA: Diagnosis not present

## 2018-01-18 DIAGNOSIS — D509 Iron deficiency anemia, unspecified: Secondary | ICD-10-CM | POA: Diagnosis not present

## 2018-01-18 DIAGNOSIS — N2581 Secondary hyperparathyroidism of renal origin: Secondary | ICD-10-CM | POA: Diagnosis not present

## 2018-01-20 DIAGNOSIS — D631 Anemia in chronic kidney disease: Secondary | ICD-10-CM | POA: Diagnosis not present

## 2018-01-20 DIAGNOSIS — D509 Iron deficiency anemia, unspecified: Secondary | ICD-10-CM | POA: Diagnosis not present

## 2018-01-20 DIAGNOSIS — E1129 Type 2 diabetes mellitus with other diabetic kidney complication: Secondary | ICD-10-CM | POA: Diagnosis not present

## 2018-01-20 DIAGNOSIS — N2581 Secondary hyperparathyroidism of renal origin: Secondary | ICD-10-CM | POA: Diagnosis not present

## 2018-01-20 DIAGNOSIS — N186 End stage renal disease: Secondary | ICD-10-CM | POA: Diagnosis not present

## 2018-01-20 DIAGNOSIS — I4891 Unspecified atrial fibrillation: Secondary | ICD-10-CM | POA: Diagnosis not present

## 2018-01-21 ENCOUNTER — Ambulatory Visit (INDEPENDENT_AMBULATORY_CARE_PROVIDER_SITE_OTHER): Payer: Medicare Other | Admitting: Pharmacist

## 2018-01-21 DIAGNOSIS — Z7901 Long term (current) use of anticoagulants: Secondary | ICD-10-CM | POA: Diagnosis not present

## 2018-01-21 DIAGNOSIS — I4891 Unspecified atrial fibrillation: Secondary | ICD-10-CM | POA: Diagnosis not present

## 2018-01-21 LAB — POCT INR: INR: 1.6 — AB (ref 2.0–3.0)

## 2018-01-21 MED ORDER — WARFARIN SODIUM 5 MG PO TABS: ORAL_TABLET | ORAL | 0 refills | Status: DC

## 2018-01-22 DIAGNOSIS — N2581 Secondary hyperparathyroidism of renal origin: Secondary | ICD-10-CM | POA: Diagnosis not present

## 2018-01-22 DIAGNOSIS — E1129 Type 2 diabetes mellitus with other diabetic kidney complication: Secondary | ICD-10-CM | POA: Diagnosis not present

## 2018-01-22 DIAGNOSIS — N186 End stage renal disease: Secondary | ICD-10-CM | POA: Diagnosis not present

## 2018-01-22 DIAGNOSIS — D509 Iron deficiency anemia, unspecified: Secondary | ICD-10-CM | POA: Diagnosis not present

## 2018-01-22 DIAGNOSIS — D631 Anemia in chronic kidney disease: Secondary | ICD-10-CM | POA: Diagnosis not present

## 2018-01-24 ENCOUNTER — Ambulatory Visit (INDEPENDENT_AMBULATORY_CARE_PROVIDER_SITE_OTHER): Payer: Medicare Other | Admitting: Podiatry

## 2018-01-24 DIAGNOSIS — S90821D Blister (nonthermal), right foot, subsequent encounter: Secondary | ICD-10-CM | POA: Diagnosis not present

## 2018-01-25 DIAGNOSIS — D631 Anemia in chronic kidney disease: Secondary | ICD-10-CM | POA: Diagnosis not present

## 2018-01-25 DIAGNOSIS — D509 Iron deficiency anemia, unspecified: Secondary | ICD-10-CM | POA: Diagnosis not present

## 2018-01-25 DIAGNOSIS — N2581 Secondary hyperparathyroidism of renal origin: Secondary | ICD-10-CM | POA: Diagnosis not present

## 2018-01-25 DIAGNOSIS — E1129 Type 2 diabetes mellitus with other diabetic kidney complication: Secondary | ICD-10-CM | POA: Diagnosis not present

## 2018-01-25 DIAGNOSIS — N186 End stage renal disease: Secondary | ICD-10-CM | POA: Diagnosis not present

## 2018-01-26 ENCOUNTER — Other Ambulatory Visit: Payer: Self-pay

## 2018-01-26 DIAGNOSIS — I7025 Atherosclerosis of native arteries of other extremities with ulceration: Secondary | ICD-10-CM

## 2018-01-26 DIAGNOSIS — I739 Peripheral vascular disease, unspecified: Secondary | ICD-10-CM

## 2018-01-27 DIAGNOSIS — D509 Iron deficiency anemia, unspecified: Secondary | ICD-10-CM | POA: Diagnosis not present

## 2018-01-27 DIAGNOSIS — N2581 Secondary hyperparathyroidism of renal origin: Secondary | ICD-10-CM | POA: Diagnosis not present

## 2018-01-27 DIAGNOSIS — D631 Anemia in chronic kidney disease: Secondary | ICD-10-CM | POA: Diagnosis not present

## 2018-01-27 DIAGNOSIS — N186 End stage renal disease: Secondary | ICD-10-CM | POA: Diagnosis not present

## 2018-01-27 DIAGNOSIS — E1129 Type 2 diabetes mellitus with other diabetic kidney complication: Secondary | ICD-10-CM | POA: Diagnosis not present

## 2018-01-28 DIAGNOSIS — N186 End stage renal disease: Secondary | ICD-10-CM | POA: Diagnosis not present

## 2018-01-28 DIAGNOSIS — E1151 Type 2 diabetes mellitus with diabetic peripheral angiopathy without gangrene: Secondary | ICD-10-CM | POA: Diagnosis not present

## 2018-01-28 DIAGNOSIS — E1122 Type 2 diabetes mellitus with diabetic chronic kidney disease: Secondary | ICD-10-CM | POA: Diagnosis not present

## 2018-01-28 DIAGNOSIS — Z794 Long term (current) use of insulin: Secondary | ICD-10-CM | POA: Diagnosis not present

## 2018-01-28 DIAGNOSIS — I120 Hypertensive chronic kidney disease with stage 5 chronic kidney disease or end stage renal disease: Secondary | ICD-10-CM | POA: Diagnosis not present

## 2018-01-28 DIAGNOSIS — S90821D Blister (nonthermal), right foot, subsequent encounter: Secondary | ICD-10-CM | POA: Diagnosis not present

## 2018-01-29 DIAGNOSIS — D509 Iron deficiency anemia, unspecified: Secondary | ICD-10-CM | POA: Diagnosis not present

## 2018-01-29 DIAGNOSIS — N2581 Secondary hyperparathyroidism of renal origin: Secondary | ICD-10-CM | POA: Diagnosis not present

## 2018-01-29 DIAGNOSIS — N186 End stage renal disease: Secondary | ICD-10-CM | POA: Diagnosis not present

## 2018-01-29 DIAGNOSIS — D631 Anemia in chronic kidney disease: Secondary | ICD-10-CM | POA: Diagnosis not present

## 2018-01-29 DIAGNOSIS — E1129 Type 2 diabetes mellitus with other diabetic kidney complication: Secondary | ICD-10-CM | POA: Diagnosis not present

## 2018-01-31 ENCOUNTER — Ambulatory Visit (INDEPENDENT_AMBULATORY_CARE_PROVIDER_SITE_OTHER): Payer: Medicare Other | Admitting: Pharmacist

## 2018-01-31 DIAGNOSIS — I4891 Unspecified atrial fibrillation: Secondary | ICD-10-CM

## 2018-01-31 DIAGNOSIS — Z7901 Long term (current) use of anticoagulants: Secondary | ICD-10-CM | POA: Diagnosis not present

## 2018-01-31 DIAGNOSIS — N186 End stage renal disease: Secondary | ICD-10-CM | POA: Diagnosis not present

## 2018-01-31 DIAGNOSIS — S90821D Blister (nonthermal), right foot, subsequent encounter: Secondary | ICD-10-CM | POA: Diagnosis not present

## 2018-01-31 DIAGNOSIS — I120 Hypertensive chronic kidney disease with stage 5 chronic kidney disease or end stage renal disease: Secondary | ICD-10-CM | POA: Diagnosis not present

## 2018-01-31 DIAGNOSIS — E1122 Type 2 diabetes mellitus with diabetic chronic kidney disease: Secondary | ICD-10-CM | POA: Diagnosis not present

## 2018-01-31 DIAGNOSIS — Z794 Long term (current) use of insulin: Secondary | ICD-10-CM | POA: Diagnosis not present

## 2018-01-31 DIAGNOSIS — E1151 Type 2 diabetes mellitus with diabetic peripheral angiopathy without gangrene: Secondary | ICD-10-CM | POA: Diagnosis not present

## 2018-01-31 LAB — POCT INR: INR: 1.8 — AB (ref 2.0–3.0)

## 2018-02-01 DIAGNOSIS — N2581 Secondary hyperparathyroidism of renal origin: Secondary | ICD-10-CM | POA: Diagnosis not present

## 2018-02-01 DIAGNOSIS — D631 Anemia in chronic kidney disease: Secondary | ICD-10-CM | POA: Diagnosis not present

## 2018-02-01 DIAGNOSIS — N186 End stage renal disease: Secondary | ICD-10-CM | POA: Diagnosis not present

## 2018-02-01 DIAGNOSIS — E1129 Type 2 diabetes mellitus with other diabetic kidney complication: Secondary | ICD-10-CM | POA: Diagnosis not present

## 2018-02-01 DIAGNOSIS — D509 Iron deficiency anemia, unspecified: Secondary | ICD-10-CM | POA: Diagnosis not present

## 2018-02-03 ENCOUNTER — Other Ambulatory Visit: Payer: Self-pay | Admitting: Internal Medicine

## 2018-02-03 DIAGNOSIS — Z89512 Acquired absence of left leg below knee: Secondary | ICD-10-CM | POA: Diagnosis not present

## 2018-02-03 DIAGNOSIS — Z794 Long term (current) use of insulin: Secondary | ICD-10-CM | POA: Diagnosis not present

## 2018-02-03 DIAGNOSIS — E1122 Type 2 diabetes mellitus with diabetic chronic kidney disease: Secondary | ICD-10-CM | POA: Diagnosis not present

## 2018-02-03 DIAGNOSIS — N2581 Secondary hyperparathyroidism of renal origin: Secondary | ICD-10-CM | POA: Diagnosis not present

## 2018-02-03 DIAGNOSIS — N186 End stage renal disease: Secondary | ICD-10-CM | POA: Diagnosis not present

## 2018-02-03 DIAGNOSIS — D631 Anemia in chronic kidney disease: Secondary | ICD-10-CM | POA: Diagnosis not present

## 2018-02-03 DIAGNOSIS — E1151 Type 2 diabetes mellitus with diabetic peripheral angiopathy without gangrene: Secondary | ICD-10-CM | POA: Diagnosis not present

## 2018-02-03 DIAGNOSIS — S90821D Blister (nonthermal), right foot, subsequent encounter: Secondary | ICD-10-CM | POA: Diagnosis not present

## 2018-02-03 DIAGNOSIS — I120 Hypertensive chronic kidney disease with stage 5 chronic kidney disease or end stage renal disease: Secondary | ICD-10-CM | POA: Diagnosis not present

## 2018-02-03 DIAGNOSIS — D509 Iron deficiency anemia, unspecified: Secondary | ICD-10-CM | POA: Diagnosis not present

## 2018-02-03 DIAGNOSIS — E1129 Type 2 diabetes mellitus with other diabetic kidney complication: Secondary | ICD-10-CM | POA: Diagnosis not present

## 2018-02-04 DIAGNOSIS — Z794 Long term (current) use of insulin: Secondary | ICD-10-CM | POA: Diagnosis not present

## 2018-02-04 DIAGNOSIS — N186 End stage renal disease: Secondary | ICD-10-CM | POA: Diagnosis not present

## 2018-02-04 DIAGNOSIS — I120 Hypertensive chronic kidney disease with stage 5 chronic kidney disease or end stage renal disease: Secondary | ICD-10-CM | POA: Diagnosis not present

## 2018-02-04 DIAGNOSIS — S90821D Blister (nonthermal), right foot, subsequent encounter: Secondary | ICD-10-CM | POA: Diagnosis not present

## 2018-02-04 DIAGNOSIS — E1151 Type 2 diabetes mellitus with diabetic peripheral angiopathy without gangrene: Secondary | ICD-10-CM | POA: Diagnosis not present

## 2018-02-04 DIAGNOSIS — E1122 Type 2 diabetes mellitus with diabetic chronic kidney disease: Secondary | ICD-10-CM | POA: Diagnosis not present

## 2018-02-05 DIAGNOSIS — E1129 Type 2 diabetes mellitus with other diabetic kidney complication: Secondary | ICD-10-CM | POA: Diagnosis not present

## 2018-02-05 DIAGNOSIS — N186 End stage renal disease: Secondary | ICD-10-CM | POA: Diagnosis not present

## 2018-02-05 DIAGNOSIS — D509 Iron deficiency anemia, unspecified: Secondary | ICD-10-CM | POA: Diagnosis not present

## 2018-02-05 DIAGNOSIS — D631 Anemia in chronic kidney disease: Secondary | ICD-10-CM | POA: Diagnosis not present

## 2018-02-05 DIAGNOSIS — N2581 Secondary hyperparathyroidism of renal origin: Secondary | ICD-10-CM | POA: Diagnosis not present

## 2018-02-06 NOTE — Progress Notes (Signed)
  Subjective:  Patient ID: Jackie [name], female    DOB: 09/11/1934,  MRN: [id number]  No chief complaint on file.  83 y.o. female returns for foot care.  Using medihoney pleased with results.  Objective:  There were no vitals filed for this visit. General AA&O x3. Normal mood and affect.  Vascular Pedal pulses nonpalpable foot cool to touch..  Absent capillary refill  Neurologic Epicritic sensation grossly intact.  Sensation intact all digits right foot  Dermatologic  Blistered area almost completely healed  Orthopedic:  History left BKA Right foot hammertoe deformities   Assessment & Plan:  Patient was evaluated and treated and all questions answered.  Blister R Foot, in the setting of PAD -Almost completely healed.  Continue medihoney until completion  Return in about 1 month (around 02/21/2018) for Right, Wound Care.

## 2018-02-06 NOTE — Progress Notes (Signed)
  Subjective:  Patient ID: Jackie [name], female    DOB: 10/06/1934,  MRN: [id number]  Chief Complaint  Patient presents with  . Wound Check    Rt foot lateral side, also c/o blister on left stump   83 y.o. female returns for foot care.  States the blisters are doing a little better.  Also reports blister to the left stump.  Has appointment with vascular and vein next week.  Objective:  There were no vitals filed for this visit. General AA&O x3. Normal mood and affect.  Vascular Pedal pulses nonpalpable foot cool to touch..  Absent capillary refill  Neurologic Epicritic sensation grossly intact.  Sensation intact all digits right foot  Dermatologic  6x5 blister to the right foot with healthy base after expression of fluid and excision of redundant bulla.  Follow wit serous drainage prior to expression.  Right hallux tip with callus formation concerns for early dry gangrene.  Orthopedic:  History left BKA Right foot hammertoe deformities   Assessment & Plan:  Patient was evaluated and treated and all questions answered.  Blister R Foot, in the setting of PAD -Continue Medihoney.  Onychomycosis -Nails debrided x5.  Left amputation stump blister -Recommend follow-up with Dr. Lajoyce Cornersuda  Return in about 1 week (around 11/26/2017) for Wound Care.

## 2018-02-07 ENCOUNTER — Ambulatory Visit (INDEPENDENT_AMBULATORY_CARE_PROVIDER_SITE_OTHER): Payer: Medicare Other | Admitting: Pharmacist Clinician (PhC)/ Clinical Pharmacy Specialist

## 2018-02-07 DIAGNOSIS — Z7901 Long term (current) use of anticoagulants: Secondary | ICD-10-CM

## 2018-02-07 DIAGNOSIS — S90821D Blister (nonthermal), right foot, subsequent encounter: Secondary | ICD-10-CM | POA: Diagnosis not present

## 2018-02-07 DIAGNOSIS — Z794 Long term (current) use of insulin: Secondary | ICD-10-CM | POA: Diagnosis not present

## 2018-02-07 DIAGNOSIS — E1122 Type 2 diabetes mellitus with diabetic chronic kidney disease: Secondary | ICD-10-CM | POA: Diagnosis not present

## 2018-02-07 DIAGNOSIS — I4891 Unspecified atrial fibrillation: Secondary | ICD-10-CM

## 2018-02-07 DIAGNOSIS — N186 End stage renal disease: Secondary | ICD-10-CM | POA: Diagnosis not present

## 2018-02-07 DIAGNOSIS — I120 Hypertensive chronic kidney disease with stage 5 chronic kidney disease or end stage renal disease: Secondary | ICD-10-CM | POA: Diagnosis not present

## 2018-02-07 DIAGNOSIS — E1151 Type 2 diabetes mellitus with diabetic peripheral angiopathy without gangrene: Secondary | ICD-10-CM | POA: Diagnosis not present

## 2018-02-07 LAB — POCT INR: INR: 2.6 (ref 2.0–3.0)

## 2018-02-08 DIAGNOSIS — N186 End stage renal disease: Secondary | ICD-10-CM | POA: Diagnosis not present

## 2018-02-08 DIAGNOSIS — N2581 Secondary hyperparathyroidism of renal origin: Secondary | ICD-10-CM | POA: Diagnosis not present

## 2018-02-08 DIAGNOSIS — D509 Iron deficiency anemia, unspecified: Secondary | ICD-10-CM | POA: Diagnosis not present

## 2018-02-08 DIAGNOSIS — E1129 Type 2 diabetes mellitus with other diabetic kidney complication: Secondary | ICD-10-CM | POA: Diagnosis not present

## 2018-02-08 DIAGNOSIS — D631 Anemia in chronic kidney disease: Secondary | ICD-10-CM | POA: Diagnosis not present

## 2018-02-10 DIAGNOSIS — D631 Anemia in chronic kidney disease: Secondary | ICD-10-CM | POA: Diagnosis not present

## 2018-02-10 DIAGNOSIS — E1129 Type 2 diabetes mellitus with other diabetic kidney complication: Secondary | ICD-10-CM | POA: Diagnosis not present

## 2018-02-10 DIAGNOSIS — N2581 Secondary hyperparathyroidism of renal origin: Secondary | ICD-10-CM | POA: Diagnosis not present

## 2018-02-10 DIAGNOSIS — N186 End stage renal disease: Secondary | ICD-10-CM | POA: Diagnosis not present

## 2018-02-10 DIAGNOSIS — D509 Iron deficiency anemia, unspecified: Secondary | ICD-10-CM | POA: Diagnosis not present

## 2018-02-12 DIAGNOSIS — N186 End stage renal disease: Secondary | ICD-10-CM | POA: Diagnosis not present

## 2018-02-12 DIAGNOSIS — D631 Anemia in chronic kidney disease: Secondary | ICD-10-CM | POA: Diagnosis not present

## 2018-02-12 DIAGNOSIS — D509 Iron deficiency anemia, unspecified: Secondary | ICD-10-CM | POA: Diagnosis not present

## 2018-02-12 DIAGNOSIS — E1129 Type 2 diabetes mellitus with other diabetic kidney complication: Secondary | ICD-10-CM | POA: Diagnosis not present

## 2018-02-12 DIAGNOSIS — N2581 Secondary hyperparathyroidism of renal origin: Secondary | ICD-10-CM | POA: Diagnosis not present

## 2018-02-14 ENCOUNTER — Ambulatory Visit (INDEPENDENT_AMBULATORY_CARE_PROVIDER_SITE_OTHER): Payer: Medicare Other | Admitting: Pharmacist Clinician (PhC)/ Clinical Pharmacy Specialist

## 2018-02-14 DIAGNOSIS — E1151 Type 2 diabetes mellitus with diabetic peripheral angiopathy without gangrene: Secondary | ICD-10-CM | POA: Diagnosis not present

## 2018-02-14 DIAGNOSIS — I120 Hypertensive chronic kidney disease with stage 5 chronic kidney disease or end stage renal disease: Secondary | ICD-10-CM | POA: Diagnosis not present

## 2018-02-14 DIAGNOSIS — Z794 Long term (current) use of insulin: Secondary | ICD-10-CM | POA: Diagnosis not present

## 2018-02-14 DIAGNOSIS — Z7901 Long term (current) use of anticoagulants: Secondary | ICD-10-CM

## 2018-02-14 DIAGNOSIS — E1122 Type 2 diabetes mellitus with diabetic chronic kidney disease: Secondary | ICD-10-CM | POA: Diagnosis not present

## 2018-02-14 DIAGNOSIS — I4891 Unspecified atrial fibrillation: Secondary | ICD-10-CM | POA: Diagnosis not present

## 2018-02-14 DIAGNOSIS — N186 End stage renal disease: Secondary | ICD-10-CM | POA: Diagnosis not present

## 2018-02-14 DIAGNOSIS — S90821D Blister (nonthermal), right foot, subsequent encounter: Secondary | ICD-10-CM | POA: Diagnosis not present

## 2018-02-14 LAB — POCT INR: INR: 2.8 (ref 2.0–3.0)

## 2018-02-15 DIAGNOSIS — N186 End stage renal disease: Secondary | ICD-10-CM | POA: Diagnosis not present

## 2018-02-15 DIAGNOSIS — N2581 Secondary hyperparathyroidism of renal origin: Secondary | ICD-10-CM | POA: Diagnosis not present

## 2018-02-15 DIAGNOSIS — D631 Anemia in chronic kidney disease: Secondary | ICD-10-CM | POA: Diagnosis not present

## 2018-02-15 DIAGNOSIS — E1129 Type 2 diabetes mellitus with other diabetic kidney complication: Secondary | ICD-10-CM | POA: Diagnosis not present

## 2018-02-15 DIAGNOSIS — D509 Iron deficiency anemia, unspecified: Secondary | ICD-10-CM | POA: Diagnosis not present

## 2018-02-17 DIAGNOSIS — Z992 Dependence on renal dialysis: Secondary | ICD-10-CM | POA: Diagnosis not present

## 2018-02-17 DIAGNOSIS — N2581 Secondary hyperparathyroidism of renal origin: Secondary | ICD-10-CM | POA: Diagnosis not present

## 2018-02-17 DIAGNOSIS — N186 End stage renal disease: Secondary | ICD-10-CM | POA: Diagnosis not present

## 2018-02-17 DIAGNOSIS — D631 Anemia in chronic kidney disease: Secondary | ICD-10-CM | POA: Diagnosis not present

## 2018-02-17 DIAGNOSIS — D509 Iron deficiency anemia, unspecified: Secondary | ICD-10-CM | POA: Diagnosis not present

## 2018-02-17 DIAGNOSIS — E1129 Type 2 diabetes mellitus with other diabetic kidney complication: Secondary | ICD-10-CM | POA: Diagnosis not present

## 2018-02-18 ENCOUNTER — Ambulatory Visit (INDEPENDENT_AMBULATORY_CARE_PROVIDER_SITE_OTHER): Payer: Medicare Other | Admitting: Podiatry

## 2018-02-18 DIAGNOSIS — L97511 Non-pressure chronic ulcer of other part of right foot limited to breakdown of skin: Secondary | ICD-10-CM | POA: Diagnosis not present

## 2018-02-18 DIAGNOSIS — S90821D Blister (nonthermal), right foot, subsequent encounter: Secondary | ICD-10-CM

## 2018-02-19 DIAGNOSIS — D509 Iron deficiency anemia, unspecified: Secondary | ICD-10-CM | POA: Diagnosis not present

## 2018-02-19 DIAGNOSIS — E1129 Type 2 diabetes mellitus with other diabetic kidney complication: Secondary | ICD-10-CM | POA: Diagnosis not present

## 2018-02-19 DIAGNOSIS — N2581 Secondary hyperparathyroidism of renal origin: Secondary | ICD-10-CM | POA: Diagnosis not present

## 2018-02-19 DIAGNOSIS — N186 End stage renal disease: Secondary | ICD-10-CM | POA: Diagnosis not present

## 2018-02-19 DIAGNOSIS — D631 Anemia in chronic kidney disease: Secondary | ICD-10-CM | POA: Diagnosis not present

## 2018-02-20 NOTE — Progress Notes (Signed)
  Subjective:  Patient ID: Jackie [name], female    DOB: 04/20/1935,  MRN: [id number]  Chief Complaint  Patient presents with  . Foot Ulcer    right foot   83 y.o. female returns for foot care.  Still using medihoney.  Objective:  There were no vitals filed for this visit. General AA&O x3. Normal mood and affect.  Vascular Pedal pulses nonpalpable foot cool to touch..  Absent capillary refill  Neurologic Epicritic sensation grossly intact.  Sensation intact all digits right foot  Dermatologic  Blistered area almost completely healed  Orthopedic:  History left BKA Right foot hammertoe deformities   Assessment & Plan:  Patient was evaluated and treated and all questions answered.  Blister R Foot, in the setting of PAD -Debrided as below. -Continue medihoney to complete healing  Procedure: Excisional Debridement of Wound Rationale: Removal of non-viable soft tissue from the wound to promote healing.  Anesthesia: nnone Pre-Debridement Wound Measurements:  Overlying hyperkeratosis  Post-Debridement Wound Measurements: 1 cm x 0.2 cm x 0. cm  Type of Debridement: Excisional Tissue Removed: Non-viable soft tissue Depth of Debridement: subq Instrumentation: 3-0 mm dermal curette Technique: Sharp excisional debridement to bleeding, viable wound base.  Dressing: Dry, sterile, compression dressing. Disposition: Patient tolerated procedure well. Patient to return in 1 week for follow-up.       Return in about 1 month (around 03/18/2018) for Wound Care, Right.

## 2018-02-21 ENCOUNTER — Ambulatory Visit (INDEPENDENT_AMBULATORY_CARE_PROVIDER_SITE_OTHER): Payer: Medicare Other | Admitting: Pharmacist

## 2018-02-21 DIAGNOSIS — E1151 Type 2 diabetes mellitus with diabetic peripheral angiopathy without gangrene: Secondary | ICD-10-CM | POA: Diagnosis not present

## 2018-02-21 DIAGNOSIS — I4891 Unspecified atrial fibrillation: Secondary | ICD-10-CM

## 2018-02-21 DIAGNOSIS — Z7901 Long term (current) use of anticoagulants: Secondary | ICD-10-CM | POA: Diagnosis not present

## 2018-02-21 DIAGNOSIS — E1122 Type 2 diabetes mellitus with diabetic chronic kidney disease: Secondary | ICD-10-CM | POA: Diagnosis not present

## 2018-02-21 DIAGNOSIS — Z794 Long term (current) use of insulin: Secondary | ICD-10-CM | POA: Diagnosis not present

## 2018-02-21 DIAGNOSIS — S90821D Blister (nonthermal), right foot, subsequent encounter: Secondary | ICD-10-CM | POA: Diagnosis not present

## 2018-02-21 DIAGNOSIS — I120 Hypertensive chronic kidney disease with stage 5 chronic kidney disease or end stage renal disease: Secondary | ICD-10-CM | POA: Diagnosis not present

## 2018-02-21 DIAGNOSIS — N186 End stage renal disease: Secondary | ICD-10-CM | POA: Diagnosis not present

## 2018-02-21 LAB — POCT INR: INR: 3 (ref 2.0–3.0)

## 2018-02-22 DIAGNOSIS — N2581 Secondary hyperparathyroidism of renal origin: Secondary | ICD-10-CM | POA: Diagnosis not present

## 2018-02-22 DIAGNOSIS — D631 Anemia in chronic kidney disease: Secondary | ICD-10-CM | POA: Diagnosis not present

## 2018-02-22 DIAGNOSIS — N186 End stage renal disease: Secondary | ICD-10-CM | POA: Diagnosis not present

## 2018-02-22 DIAGNOSIS — D509 Iron deficiency anemia, unspecified: Secondary | ICD-10-CM | POA: Diagnosis not present

## 2018-02-22 DIAGNOSIS — E1129 Type 2 diabetes mellitus with other diabetic kidney complication: Secondary | ICD-10-CM | POA: Diagnosis not present

## 2018-02-24 DIAGNOSIS — E1129 Type 2 diabetes mellitus with other diabetic kidney complication: Secondary | ICD-10-CM | POA: Diagnosis not present

## 2018-02-24 DIAGNOSIS — N186 End stage renal disease: Secondary | ICD-10-CM | POA: Diagnosis not present

## 2018-02-24 DIAGNOSIS — D509 Iron deficiency anemia, unspecified: Secondary | ICD-10-CM | POA: Diagnosis not present

## 2018-02-24 DIAGNOSIS — N2581 Secondary hyperparathyroidism of renal origin: Secondary | ICD-10-CM | POA: Diagnosis not present

## 2018-02-24 DIAGNOSIS — D631 Anemia in chronic kidney disease: Secondary | ICD-10-CM | POA: Diagnosis not present

## 2018-02-26 DIAGNOSIS — E1129 Type 2 diabetes mellitus with other diabetic kidney complication: Secondary | ICD-10-CM | POA: Diagnosis not present

## 2018-02-26 DIAGNOSIS — N2581 Secondary hyperparathyroidism of renal origin: Secondary | ICD-10-CM | POA: Diagnosis not present

## 2018-02-26 DIAGNOSIS — D631 Anemia in chronic kidney disease: Secondary | ICD-10-CM | POA: Diagnosis not present

## 2018-02-26 DIAGNOSIS — N186 End stage renal disease: Secondary | ICD-10-CM | POA: Diagnosis not present

## 2018-02-26 DIAGNOSIS — D509 Iron deficiency anemia, unspecified: Secondary | ICD-10-CM | POA: Diagnosis not present

## 2018-02-28 ENCOUNTER — Ambulatory Visit (INDEPENDENT_AMBULATORY_CARE_PROVIDER_SITE_OTHER): Payer: Medicare Other | Admitting: Pharmacist

## 2018-02-28 DIAGNOSIS — E1151 Type 2 diabetes mellitus with diabetic peripheral angiopathy without gangrene: Secondary | ICD-10-CM | POA: Diagnosis not present

## 2018-02-28 DIAGNOSIS — N186 End stage renal disease: Secondary | ICD-10-CM | POA: Diagnosis not present

## 2018-02-28 DIAGNOSIS — Z7901 Long term (current) use of anticoagulants: Secondary | ICD-10-CM

## 2018-02-28 DIAGNOSIS — I120 Hypertensive chronic kidney disease with stage 5 chronic kidney disease or end stage renal disease: Secondary | ICD-10-CM | POA: Diagnosis not present

## 2018-02-28 DIAGNOSIS — I4891 Unspecified atrial fibrillation: Secondary | ICD-10-CM

## 2018-02-28 DIAGNOSIS — E1122 Type 2 diabetes mellitus with diabetic chronic kidney disease: Secondary | ICD-10-CM | POA: Diagnosis not present

## 2018-02-28 DIAGNOSIS — Z794 Long term (current) use of insulin: Secondary | ICD-10-CM | POA: Diagnosis not present

## 2018-02-28 DIAGNOSIS — S90821D Blister (nonthermal), right foot, subsequent encounter: Secondary | ICD-10-CM | POA: Diagnosis not present

## 2018-02-28 LAB — POCT INR: INR: 2.6 (ref 2.0–3.0)

## 2018-03-01 DIAGNOSIS — E1129 Type 2 diabetes mellitus with other diabetic kidney complication: Secondary | ICD-10-CM | POA: Diagnosis not present

## 2018-03-01 DIAGNOSIS — N186 End stage renal disease: Secondary | ICD-10-CM | POA: Diagnosis not present

## 2018-03-01 DIAGNOSIS — D509 Iron deficiency anemia, unspecified: Secondary | ICD-10-CM | POA: Diagnosis not present

## 2018-03-01 DIAGNOSIS — N2581 Secondary hyperparathyroidism of renal origin: Secondary | ICD-10-CM | POA: Diagnosis not present

## 2018-03-01 DIAGNOSIS — D631 Anemia in chronic kidney disease: Secondary | ICD-10-CM | POA: Diagnosis not present

## 2018-03-03 DIAGNOSIS — D631 Anemia in chronic kidney disease: Secondary | ICD-10-CM | POA: Diagnosis not present

## 2018-03-03 DIAGNOSIS — D509 Iron deficiency anemia, unspecified: Secondary | ICD-10-CM | POA: Diagnosis not present

## 2018-03-03 DIAGNOSIS — N2581 Secondary hyperparathyroidism of renal origin: Secondary | ICD-10-CM | POA: Diagnosis not present

## 2018-03-03 DIAGNOSIS — N186 End stage renal disease: Secondary | ICD-10-CM | POA: Diagnosis not present

## 2018-03-03 DIAGNOSIS — E1129 Type 2 diabetes mellitus with other diabetic kidney complication: Secondary | ICD-10-CM | POA: Diagnosis not present

## 2018-03-05 DIAGNOSIS — N2581 Secondary hyperparathyroidism of renal origin: Secondary | ICD-10-CM | POA: Diagnosis not present

## 2018-03-05 DIAGNOSIS — N186 End stage renal disease: Secondary | ICD-10-CM | POA: Diagnosis not present

## 2018-03-05 DIAGNOSIS — D631 Anemia in chronic kidney disease: Secondary | ICD-10-CM | POA: Diagnosis not present

## 2018-03-05 DIAGNOSIS — E1129 Type 2 diabetes mellitus with other diabetic kidney complication: Secondary | ICD-10-CM | POA: Diagnosis not present

## 2018-03-05 DIAGNOSIS — D509 Iron deficiency anemia, unspecified: Secondary | ICD-10-CM | POA: Diagnosis not present

## 2018-03-07 DIAGNOSIS — S90821D Blister (nonthermal), right foot, subsequent encounter: Secondary | ICD-10-CM | POA: Diagnosis not present

## 2018-03-07 DIAGNOSIS — E1122 Type 2 diabetes mellitus with diabetic chronic kidney disease: Secondary | ICD-10-CM | POA: Diagnosis not present

## 2018-03-07 DIAGNOSIS — N186 End stage renal disease: Secondary | ICD-10-CM | POA: Diagnosis not present

## 2018-03-07 DIAGNOSIS — Z794 Long term (current) use of insulin: Secondary | ICD-10-CM | POA: Diagnosis not present

## 2018-03-07 DIAGNOSIS — I120 Hypertensive chronic kidney disease with stage 5 chronic kidney disease or end stage renal disease: Secondary | ICD-10-CM | POA: Diagnosis not present

## 2018-03-07 DIAGNOSIS — E1151 Type 2 diabetes mellitus with diabetic peripheral angiopathy without gangrene: Secondary | ICD-10-CM | POA: Diagnosis not present

## 2018-03-08 DIAGNOSIS — N186 End stage renal disease: Secondary | ICD-10-CM | POA: Diagnosis not present

## 2018-03-08 DIAGNOSIS — E1129 Type 2 diabetes mellitus with other diabetic kidney complication: Secondary | ICD-10-CM | POA: Diagnosis not present

## 2018-03-08 DIAGNOSIS — N2581 Secondary hyperparathyroidism of renal origin: Secondary | ICD-10-CM | POA: Diagnosis not present

## 2018-03-08 DIAGNOSIS — D631 Anemia in chronic kidney disease: Secondary | ICD-10-CM | POA: Diagnosis not present

## 2018-03-08 DIAGNOSIS — D509 Iron deficiency anemia, unspecified: Secondary | ICD-10-CM | POA: Diagnosis not present

## 2018-03-10 DIAGNOSIS — N2581 Secondary hyperparathyroidism of renal origin: Secondary | ICD-10-CM | POA: Diagnosis not present

## 2018-03-10 DIAGNOSIS — N186 End stage renal disease: Secondary | ICD-10-CM | POA: Diagnosis not present

## 2018-03-10 DIAGNOSIS — D509 Iron deficiency anemia, unspecified: Secondary | ICD-10-CM | POA: Diagnosis not present

## 2018-03-10 DIAGNOSIS — E1129 Type 2 diabetes mellitus with other diabetic kidney complication: Secondary | ICD-10-CM | POA: Diagnosis not present

## 2018-03-10 DIAGNOSIS — D631 Anemia in chronic kidney disease: Secondary | ICD-10-CM | POA: Diagnosis not present

## 2018-03-12 DIAGNOSIS — N186 End stage renal disease: Secondary | ICD-10-CM | POA: Diagnosis not present

## 2018-03-12 DIAGNOSIS — N2581 Secondary hyperparathyroidism of renal origin: Secondary | ICD-10-CM | POA: Diagnosis not present

## 2018-03-12 DIAGNOSIS — D631 Anemia in chronic kidney disease: Secondary | ICD-10-CM | POA: Diagnosis not present

## 2018-03-12 DIAGNOSIS — E1129 Type 2 diabetes mellitus with other diabetic kidney complication: Secondary | ICD-10-CM | POA: Diagnosis not present

## 2018-03-12 DIAGNOSIS — D509 Iron deficiency anemia, unspecified: Secondary | ICD-10-CM | POA: Diagnosis not present

## 2018-03-14 ENCOUNTER — Ambulatory Visit (INDEPENDENT_AMBULATORY_CARE_PROVIDER_SITE_OTHER): Payer: Medicare Other | Admitting: Pharmacist

## 2018-03-14 DIAGNOSIS — E1151 Type 2 diabetes mellitus with diabetic peripheral angiopathy without gangrene: Secondary | ICD-10-CM | POA: Diagnosis not present

## 2018-03-14 DIAGNOSIS — I4891 Unspecified atrial fibrillation: Secondary | ICD-10-CM

## 2018-03-14 DIAGNOSIS — N186 End stage renal disease: Secondary | ICD-10-CM | POA: Diagnosis not present

## 2018-03-14 DIAGNOSIS — Z794 Long term (current) use of insulin: Secondary | ICD-10-CM | POA: Diagnosis not present

## 2018-03-14 DIAGNOSIS — Z7901 Long term (current) use of anticoagulants: Secondary | ICD-10-CM | POA: Diagnosis not present

## 2018-03-14 DIAGNOSIS — I120 Hypertensive chronic kidney disease with stage 5 chronic kidney disease or end stage renal disease: Secondary | ICD-10-CM | POA: Diagnosis not present

## 2018-03-14 DIAGNOSIS — S90821D Blister (nonthermal), right foot, subsequent encounter: Secondary | ICD-10-CM | POA: Diagnosis not present

## 2018-03-14 DIAGNOSIS — E1122 Type 2 diabetes mellitus with diabetic chronic kidney disease: Secondary | ICD-10-CM | POA: Diagnosis not present

## 2018-03-14 LAB — POCT INR: INR: 2.6 (ref 2.0–3.0)

## 2018-03-15 DIAGNOSIS — E1129 Type 2 diabetes mellitus with other diabetic kidney complication: Secondary | ICD-10-CM | POA: Diagnosis not present

## 2018-03-15 DIAGNOSIS — D631 Anemia in chronic kidney disease: Secondary | ICD-10-CM | POA: Diagnosis not present

## 2018-03-15 DIAGNOSIS — N186 End stage renal disease: Secondary | ICD-10-CM | POA: Diagnosis not present

## 2018-03-15 DIAGNOSIS — D509 Iron deficiency anemia, unspecified: Secondary | ICD-10-CM | POA: Diagnosis not present

## 2018-03-15 DIAGNOSIS — N2581 Secondary hyperparathyroidism of renal origin: Secondary | ICD-10-CM | POA: Diagnosis not present

## 2018-03-16 DIAGNOSIS — N186 End stage renal disease: Secondary | ICD-10-CM | POA: Diagnosis not present

## 2018-03-16 DIAGNOSIS — Z992 Dependence on renal dialysis: Secondary | ICD-10-CM | POA: Diagnosis not present

## 2018-03-16 DIAGNOSIS — I871 Compression of vein: Secondary | ICD-10-CM | POA: Diagnosis not present

## 2018-03-16 DIAGNOSIS — T82858A Stenosis of vascular prosthetic devices, implants and grafts, initial encounter: Secondary | ICD-10-CM | POA: Diagnosis not present

## 2018-03-17 DIAGNOSIS — E1129 Type 2 diabetes mellitus with other diabetic kidney complication: Secondary | ICD-10-CM | POA: Diagnosis not present

## 2018-03-17 DIAGNOSIS — D509 Iron deficiency anemia, unspecified: Secondary | ICD-10-CM | POA: Diagnosis not present

## 2018-03-17 DIAGNOSIS — N186 End stage renal disease: Secondary | ICD-10-CM | POA: Diagnosis not present

## 2018-03-17 DIAGNOSIS — D631 Anemia in chronic kidney disease: Secondary | ICD-10-CM | POA: Diagnosis not present

## 2018-03-17 DIAGNOSIS — N2581 Secondary hyperparathyroidism of renal origin: Secondary | ICD-10-CM | POA: Diagnosis not present

## 2018-03-19 DIAGNOSIS — D509 Iron deficiency anemia, unspecified: Secondary | ICD-10-CM | POA: Diagnosis not present

## 2018-03-19 DIAGNOSIS — D631 Anemia in chronic kidney disease: Secondary | ICD-10-CM | POA: Diagnosis not present

## 2018-03-19 DIAGNOSIS — N2581 Secondary hyperparathyroidism of renal origin: Secondary | ICD-10-CM | POA: Diagnosis not present

## 2018-03-19 DIAGNOSIS — E1129 Type 2 diabetes mellitus with other diabetic kidney complication: Secondary | ICD-10-CM | POA: Diagnosis not present

## 2018-03-19 DIAGNOSIS — N186 End stage renal disease: Secondary | ICD-10-CM | POA: Diagnosis not present

## 2018-03-20 DIAGNOSIS — Z992 Dependence on renal dialysis: Secondary | ICD-10-CM | POA: Diagnosis not present

## 2018-03-20 DIAGNOSIS — N186 End stage renal disease: Secondary | ICD-10-CM | POA: Diagnosis not present

## 2018-03-20 DIAGNOSIS — E1129 Type 2 diabetes mellitus with other diabetic kidney complication: Secondary | ICD-10-CM | POA: Diagnosis not present

## 2018-03-22 DIAGNOSIS — D509 Iron deficiency anemia, unspecified: Secondary | ICD-10-CM | POA: Diagnosis not present

## 2018-03-22 DIAGNOSIS — D631 Anemia in chronic kidney disease: Secondary | ICD-10-CM | POA: Diagnosis not present

## 2018-03-22 DIAGNOSIS — N186 End stage renal disease: Secondary | ICD-10-CM | POA: Diagnosis not present

## 2018-03-22 DIAGNOSIS — E1129 Type 2 diabetes mellitus with other diabetic kidney complication: Secondary | ICD-10-CM | POA: Diagnosis not present

## 2018-03-22 DIAGNOSIS — N2581 Secondary hyperparathyroidism of renal origin: Secondary | ICD-10-CM | POA: Diagnosis not present

## 2018-03-24 DIAGNOSIS — D631 Anemia in chronic kidney disease: Secondary | ICD-10-CM | POA: Diagnosis not present

## 2018-03-24 DIAGNOSIS — N2581 Secondary hyperparathyroidism of renal origin: Secondary | ICD-10-CM | POA: Diagnosis not present

## 2018-03-24 DIAGNOSIS — D509 Iron deficiency anemia, unspecified: Secondary | ICD-10-CM | POA: Diagnosis not present

## 2018-03-24 DIAGNOSIS — E1129 Type 2 diabetes mellitus with other diabetic kidney complication: Secondary | ICD-10-CM | POA: Diagnosis not present

## 2018-03-24 DIAGNOSIS — N186 End stage renal disease: Secondary | ICD-10-CM | POA: Diagnosis not present

## 2018-03-25 ENCOUNTER — Ambulatory Visit: Payer: Medicare Other | Admitting: Podiatry

## 2018-03-26 DIAGNOSIS — E1129 Type 2 diabetes mellitus with other diabetic kidney complication: Secondary | ICD-10-CM | POA: Diagnosis not present

## 2018-03-26 DIAGNOSIS — N2581 Secondary hyperparathyroidism of renal origin: Secondary | ICD-10-CM | POA: Diagnosis not present

## 2018-03-26 DIAGNOSIS — D631 Anemia in chronic kidney disease: Secondary | ICD-10-CM | POA: Diagnosis not present

## 2018-03-26 DIAGNOSIS — D509 Iron deficiency anemia, unspecified: Secondary | ICD-10-CM | POA: Diagnosis not present

## 2018-03-26 DIAGNOSIS — N186 End stage renal disease: Secondary | ICD-10-CM | POA: Diagnosis not present

## 2018-03-29 DIAGNOSIS — D631 Anemia in chronic kidney disease: Secondary | ICD-10-CM | POA: Diagnosis not present

## 2018-03-29 DIAGNOSIS — N186 End stage renal disease: Secondary | ICD-10-CM | POA: Diagnosis not present

## 2018-03-29 DIAGNOSIS — D509 Iron deficiency anemia, unspecified: Secondary | ICD-10-CM | POA: Diagnosis not present

## 2018-03-29 DIAGNOSIS — N2581 Secondary hyperparathyroidism of renal origin: Secondary | ICD-10-CM | POA: Diagnosis not present

## 2018-03-29 DIAGNOSIS — E1129 Type 2 diabetes mellitus with other diabetic kidney complication: Secondary | ICD-10-CM | POA: Diagnosis not present

## 2018-03-30 ENCOUNTER — Other Ambulatory Visit: Payer: Self-pay

## 2018-03-30 ENCOUNTER — Emergency Department (HOSPITAL_COMMUNITY)
Admission: EM | Admit: 2018-03-30 | Discharge: 2018-03-30 | Disposition: A | Discharge status: Home / Self Care | Payer: Medicare Other | Attending: Emergency Medicine | Admitting: Emergency Medicine

## 2018-03-30 ENCOUNTER — Encounter (HOSPITAL_COMMUNITY): Payer: Self-pay

## 2018-03-30 ENCOUNTER — Emergency Department (HOSPITAL_COMMUNITY): Payer: Medicare Other

## 2018-03-30 DIAGNOSIS — I4891 Unspecified atrial fibrillation: Secondary | ICD-10-CM | POA: Insufficient documentation

## 2018-03-30 DIAGNOSIS — Z7902 Long term (current) use of antithrombotics/antiplatelets: Secondary | ICD-10-CM | POA: Insufficient documentation

## 2018-03-30 DIAGNOSIS — E785 Hyperlipidemia, unspecified: Secondary | ICD-10-CM | POA: Diagnosis not present

## 2018-03-30 DIAGNOSIS — R531 Weakness: Secondary | ICD-10-CM | POA: Diagnosis not present

## 2018-03-30 DIAGNOSIS — E1122 Type 2 diabetes mellitus with diabetic chronic kidney disease: Secondary | ICD-10-CM | POA: Insufficient documentation

## 2018-03-30 DIAGNOSIS — N186 End stage renal disease: Secondary | ICD-10-CM | POA: Diagnosis not present

## 2018-03-30 DIAGNOSIS — R918 Other nonspecific abnormal finding of lung field: Secondary | ICD-10-CM | POA: Diagnosis not present

## 2018-03-30 DIAGNOSIS — E782 Mixed hyperlipidemia: Secondary | ICD-10-CM | POA: Diagnosis not present

## 2018-03-30 DIAGNOSIS — I120 Hypertensive chronic kidney disease with stage 5 chronic kidney disease or end stage renal disease: Secondary | ICD-10-CM | POA: Diagnosis not present

## 2018-03-30 DIAGNOSIS — Z79899 Other long term (current) drug therapy: Secondary | ICD-10-CM | POA: Diagnosis not present

## 2018-03-30 DIAGNOSIS — N3000 Acute cystitis without hematuria: Secondary | ICD-10-CM | POA: Insufficient documentation

## 2018-03-30 DIAGNOSIS — R791 Abnormal coagulation profile: Secondary | ICD-10-CM | POA: Insufficient documentation

## 2018-03-30 DIAGNOSIS — Z992 Dependence on renal dialysis: Secondary | ICD-10-CM | POA: Insufficient documentation

## 2018-03-30 DIAGNOSIS — Z7901 Long term (current) use of anticoagulants: Secondary | ICD-10-CM | POA: Diagnosis not present

## 2018-03-30 LAB — BASIC METABOLIC PANEL
Anion gap: 13 (ref 5–15)
BUN: 20 mg/dL (ref 8–23)
CALCIUM: 9.4 mg/dL (ref 8.9–10.3)
CO2: 24 mmol/L (ref 22–32)
CREATININE: 5.3 mg/dL — AB (ref 0.44–1.00)
Chloride: 98 mmol/L (ref 98–111)
GFR calc Af Amer: 8 mL/min — ABNORMAL LOW (ref >60)
GFR, EST NON AFRICAN AMERICAN: 7 mL/min — AB (ref >60)
GLUCOSE: 107 mg/dL — AB (ref 70–99)
Potassium: 4.2 mmol/L (ref 3.5–5.1)
Sodium: 135 mmol/L (ref 135–145)

## 2018-03-30 LAB — HEPATIC FUNCTION PANEL
ALK PHOS: 66 U/L (ref 38–126)
ALT: 15 U/L (ref 0–44)
AST: 20 U/L (ref 15–41)
Albumin: 3 g/dL — ABNORMAL LOW (ref 3.5–5.0)
BILIRUBIN INDIRECT: 0.9 mg/dL (ref 0.3–0.9)
BILIRUBIN TOTAL: 1.3 mg/dL — AB (ref 0.3–1.2)
Bilirubin, Direct: 0.4 mg/dL — ABNORMAL HIGH (ref 0.0–0.2)
TOTAL PROTEIN: 7 g/dL (ref 6.5–8.1)

## 2018-03-30 LAB — URINALYSIS, ROUTINE W REFLEX MICROSCOPIC
Glucose, UA: NEGATIVE mg/dL
Ketones, ur: NEGATIVE mg/dL
NITRITE: NEGATIVE
PH: 8 (ref 5.0–8.0)
Protein, ur: >300 mg/dL — AB
Specific Gravity, Urine: 1.02 (ref 1.005–1.030)

## 2018-03-30 LAB — URINALYSIS, MICROSCOPIC (REFLEX)
RBC / HPF: >50 RBC/hpf (ref 0–5)
WBC, UA: >50 WBC/hpf (ref 0–5)

## 2018-03-30 LAB — CBC
HEMATOCRIT: 34 % — AB (ref 36.0–46.0)
Hemoglobin: 10.3 g/dL — ABNORMAL LOW (ref 12.0–15.0)
MCH: 28.9 pg (ref 26.0–34.0)
MCHC: 30.3 g/dL (ref 30.0–36.0)
MCV: 95.2 fL (ref 78.0–100.0)
PLATELETS: 102 10*3/uL — AB (ref 150–400)
RBC: 3.57 MIL/uL — ABNORMAL LOW (ref 3.87–5.11)
RDW: 16.7 % — AB (ref 11.5–15.5)
WBC: 3.7 10*3/uL — ABNORMAL LOW (ref 4.0–10.5)

## 2018-03-30 LAB — PROTIME-INR
INR: 5.63 — AB
Prothrombin Time: 50.5 seconds — ABNORMAL HIGH (ref 11.4–15.2)

## 2018-03-30 LAB — TROPONIN I
Troponin I: 0.04 ng/mL (ref <0.03)
Troponin I: 0.03 ng/mL (ref <0.03)

## 2018-03-30 LAB — LIPASE, BLOOD: LIPASE: 25 U/L (ref 11–51)

## 2018-03-30 LAB — CBG MONITORING, ED: Glucose-Capillary: 94 mg/dL (ref 70–99)

## 2018-03-30 MED ORDER — CEPHALEXIN 500 MG PO CAPS: 500.0000 mg | ORAL_CAPSULE | Freq: Two times a day (BID) | ORAL | 0 refills | Status: AC

## 2018-03-30 MED ORDER — SODIUM CHLORIDE 0.9 % IV SOLN
1.0000 g | Freq: Once | INTRAVENOUS | Status: AC
  Administered 2018-03-30: 1 g via INTRAVENOUS
  Filled 2018-03-30: qty 10

## 2018-03-30 NOTE — ED Provider Notes (Signed)
Emergency Department Provider Note   I have reviewed the triage vital signs and the nursing notes.   HISTORY  Chief Complaint Weakness   HPI Jackie [name] is a 83 y.o. female with PMH of DM, ESRD on HD, HTN, PAF on Warfarin, and HLD presents to the emergency department with generalized weakness starting this morning.  Last had dialysis yesterday.  She states during that session she had a syncopal event which was brief.  Is any proceeding symptoms such as chest pain, heart palpitations, shortness of breath.  She finished her session and went home feeling okay.  This morning she was having generalized weakness and fatigue.  She denies any fever, chills, chest pain, cough, or abdominal pain.  She has not developed any rashes.  States that she is eating and drinking well without difficulty.  She notes that she was due to have her INR checked today but has missed that appointment because of fatigue.   Past Medical History:  Diagnosis Date  . Anemia   . Arthritis   . Diabetes mellitus   . End-stage renal disease on hemodialysis ([facility])    dialysis T/Th/Sa  . Hyperlipidemia   . Hypertension   . Pneumonia   . Thyroid disease    pt denies this    Patient Active Problem List   Diagnosis Date Noted  . Long term (current) use of anticoagulants [Z79.01] 07/30/2017  . Atrial fibrillation (HCC) 07/23/2017  . Medication management 07/23/2017  . Essential hypertension 07/23/2017  . Mixed hyperlipidemia 07/23/2017  . PAD (peripheral artery disease) (HCC) 07/23/2017  . S/P unilateral BKA (below knee amputation) (HCC) 04/24/2015  . 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 Leukocytosis, unspecified 04/22/2013  . HTN (hypertension), benign 04/18/2013  . Diabetes mellitus (HCC) 04/18/2013  . Syncope 04/18/2013  . Anemia 04/18/2013  . ESRD (end stage renal disease) on dialysis ([facility]) 09/02/2011    Past Surgical History:  Procedure Laterality Date  . ABDOMINAL AORTOGRAM  W/LOWER EXTREMITY N/A 12/01/2017   Procedure: ABDOMINAL AORTOGRAM W/LOWER EXTREMITY;  Surgeon: Nada Libman, MD;  Location: [facility] INVASIVE CV LAB;  Service: Cardiovascular;  Laterality: N/A;  bilateral  . AMPUTATION Left 04/24/2015   Procedure: LEFT BELOW KNEE AMPUTATION;  Surgeon: Nadara Mustard, MD;  Location: [facility] OR;  Service: Orthopedics;  Laterality: Left;  . ARTERIOVENOUS GRAFT PLACEMENT Left    non function  . AV FISTULA PLACEMENT Right 01/30/2013   Procedure: ARTERIOVENOUS (AV) FISTULA CREATION;  Surgeon: Fransisco Hertz, MD;  Location: [facility] OR;  Service: Vascular;  Laterality: Right;  . AV FISTULA PLACEMENT Left 10/04/2017   Procedure: INSERTION OF ARTERIOVENOUS (AV) GORE-TEX GRAFT LEFT UPPER ARM;  Surgeon: Chuck Hint, MD;  Location: [facility] OR;  Service: Vascular;  Laterality: Left;  . CESAREAN SECTION    . EYE SURGERY Bilateral    catarct  . FISTULA SUPERFICIALIZATION Right 04/24/2013   Procedure: FISTULA SUPERFICIALIZATION;  Surgeon: Larina Earthly, MD;  Location: [facility] OR;  Service: Vascular;  Laterality: Right;  . FISTULOGRAM Right 07/26/2013   Procedure: FISTULOGRAM;  Surgeon: Larina Earthly, MD;  Location: [facility] CATH LAB;  Service: Cardiovascular;  Laterality: Right;  . INSERTION OF DIALYSIS CATHETER Right 04/24/2013   Procedure: INSERTION OF DIALYSIS CATHETER;  Surgeon: Larina Earthly, MD;  Location: [facility] OR;  Service: Vascular;  Laterality: Right;  . PERIPHERAL VASCULAR ATHERECTOMY  12/01/2017   Procedure: PERIPHERAL VASCULAR ATHERECTOMY;  Surgeon: Nada Libman, MD;  Location: [facility] INVASIVE CV  LAB;  Service: Cardiovascular;;  Rt. SFA  . PERIPHERAL VASCULAR CATHETERIZATION N/A 12/05/2014   Procedure: Abdominal Aortogram;  Surgeon: Fransisco Hertz, MD;  Location: [facility] INVASIVE CV LAB;  Service: Cardiovascular;  Laterality: N/A;  . PERIPHERAL VASCULAR CATHETERIZATION N/A 03/19/2015   Procedure: Lower Extremity Angiography;  Surgeon: Nada Libman, MD;  Location: [facility] INVASIVE CV LAB;  Service:  Cardiovascular;  Laterality: N/A;  . PERIPHERAL VASCULAR CATHETERIZATION  03/19/2015   Procedure: Peripheral Vascular Intervention;  Surgeon: Nada Libman, MD;  Location: [facility] INVASIVE CV LAB;  Service: Cardiovascular;;  left sfa and popliteal stent  . TONSILLECTOMY    . UPPER EXTREMITY VENOGRAPHY N/A 09/17/2017   Procedure: UPPER EXTREMITY VENOGRAPHY;  Surgeon: Chuck Hint, MD;  Location: [facility] INVASIVE CV LAB;  Service: Cardiovascular;  Laterality: N/A;    Allergies Patient has no known allergies.  Family History  Problem Relation Age of Onset  . Diabetes Mother   . Heart disease Mother   . Hypertension Mother     Social History Social History   Tobacco Use  . Smoking status: Never Smoker  . Smokeless tobacco: Never Used  Substance Use Topics  . Alcohol use: No  . Drug use: No    Review of Systems  Constitutional: No fever/chills. Positive generalized weakness.  Eyes: No visual changes. ENT: No sore throat. Cardiovascular: Denies chest pain. Respiratory: Denies shortness of breath. Gastrointestinal: No abdominal pain. No nausea, no vomiting. No diarrhea. No constipation. Genitourinary: Negative for dysuria. Musculoskeletal: Negative for back pain. Skin: Negative for rash. Neurological: Negative for headaches, focal weakness or numbness.  10-point ROS otherwise negative.  ____________________________________________   PHYSICAL EXAM:  VITAL SIGNS: ED Triage Vitals  Enc Vitals Group     BP 03/30/18 1206 (!) 150/83     Pulse Rate 03/30/18 1206 84     Resp 03/30/18 1206 16     Temp 03/30/18 1206 97.8 F (36.6 C)     Temp Source 03/30/18 1206 Oral     SpO2 03/30/18 1159 100 %     Weight 03/30/18 1208 134 lb (60.8 kg)     Height 03/30/18 1208 5\' 8"  (1.727 m)     Pain Score 03/30/18 1208 0   Constitutional: Alert and oriented. Well appearing and in no acute distress. Eyes: Conjunctivae are normal.  Head: Atraumatic. Nose: No  congestion/rhinnorhea. Mouth/Throat: Mucous membranes are moist. Neck: No stridor.  Cardiovascular: Normal rate, regular rhythm. Good peripheral circulation. Grossly normal heart sounds.   Respiratory: Normal respiratory effort.  No retractions. Lungs CTAB. Gastrointestinal: Soft and nontender. No distention.  Musculoskeletal: No lower extremity tenderness nor edema. No gross deformities of extremities. Neurologic:  Normal speech and language. No gross focal neurologic deficits are appreciated.  Skin:  Skin is warm, dry and intact. No rash noted. ____________________________________________   LABS (all labs ordered are listed, but only abnormal results are displayed)  Labs Reviewed  BASIC METABOLIC PANEL - Abnormal; Notable for the following components:      Result Value   Glucose, Bld 107 (*)    Creatinine, Ser 5.30 (*)    GFR calc non Af Amer 7 (*)    GFR calc Af Amer 8 (*)    All other components within normal limits  CBC - Abnormal; Notable for the following components:   WBC 3.7 (*)    RBC 3.57 (*)    Hemoglobin 10.3 (*)    HCT 34.0 (*)    RDW 16.7 (*)    Platelets  102 (*)    All other components within normal limits  URINALYSIS, ROUTINE W REFLEX MICROSCOPIC - Abnormal; Notable for the following components:   Color, Urine BROWN (*)    APPearance TURBID (*)    Hgb urine dipstick LARGE (*)    Bilirubin Urine MODERATE (*)    Protein, ur >300 (*)    Leukocytes, UA MODERATE (*)    All other components within normal limits  HEPATIC FUNCTION PANEL - Abnormal; Notable for the following components:   Albumin 3.0 (*)    Total Bilirubin 1.3 (*)    Bilirubin, Direct 0.4 (*)    All other components within normal limits  TROPONIN I - Abnormal; Notable for the following components:   Troponin I 0.04 (*)    All other components within normal limits  PROTIME-INR - Abnormal; Notable for the following components:   Prothrombin Time 50.5 (*)    INR 5.63 (*)    All other components  within normal limits  URINALYSIS, MICROSCOPIC (REFLEX) - Abnormal; Notable for the following components:   Bacteria, UA MANY (*)    All other components within normal limits  TROPONIN I - Abnormal; Notable for the following components:   Troponin I 0.03 (*)    All other components within normal limits  LIPASE, BLOOD  CBG MONITORING, ED   ____________________________________________  EKG   EKG Interpretation  Date/Time:  Wednesday March 30 2018 11:59:51 EDT Ventricular Rate:  89 PR Interval:    QRS Duration: 83 QT Interval:  386 QTC Calculation: 470 R Axis:   91 Text Interpretation:  Atrial fibrillation Right axis deviation Low voltage, extremity leads Nonspecific T abnormalities, lateral leads No STEMI.  Confirmed by Alona Bene [phone]) on 03/30/2018 5:20:07 PM       ____________________________________________  RADIOLOGY  Dg Chest 2 View  Result Date: 03/30/2018 CLINICAL DATA:  Weakness.  Syncope. EXAM: CHEST - 2 VIEW COMPARISON:  04/24/2015 chest radiograph. FINDINGS: Vascular stent overlies the right axillary region. Stable cardiomediastinal silhouette with mild cardiomegaly. No pneumothorax. No pleural effusion. Reticular opacities at both lung bases are not appreciably changed. No overt pulmonary edema. No acute consolidative airspace disease. IMPRESSION: 1. Stable mild cardiomegaly without pulmonary edema. 2. Stable reticular opacities at both lung bases compatible with nonspecific mild fibrosis. Electronically Signed   By: Delbert Phenix M.D.   On: 03/30/2018 13:59    ____________________________________________   PROCEDURES  Procedure(s) performed:   Procedures  None ____________________________________________   INITIAL IMPRESSION / ASSESSMENT AND PLAN / ED COURSE  Pertinent labs & imaging results that were available during my care of the patient were reviewed by me and considered in my medical decision making (see chart for details).  Urgency  department for evaluation of generalized weakness starting this morning.  Labs show a supratherapeutic INR.  Advised that she hold the next 2 doses of her Coumadin and go for INR check later this week at HD. No signs/symptoms to suspect active bleeding. UA provided by the patient that she does produce a small amount of urine every day.  This does show concern for UTI.  Labs reviewed with no acute findings.  Troponin is mildly elevated likely secondary to end-stage renal disease and not increasing on trending.  Chest x-ray with no acute findings. Plan for treatment with Keflex and PCP follow up. Patient feeling much better since arrival to the ED. Eating without difficulty.   At this time, I do not feel there is any life-threatening condition present. I have reviewed  and discussed all results (EKG, imaging, lab, urine as appropriate), exam findings with patient. I have reviewed nursing notes and appropriate previous records.  I feel the patient is safe to be discharged home without further emergent workup. Discussed usual and customary return precautions. Patient and family (if present) verbalize understanding and are comfortable with this plan.  Patient will follow-up with their primary care provider. If they do not have a primary care provider, information for follow-up has been provided to them. All questions have been answered.   ____________________________________________  FINAL CLINICAL IMPRESSION(S) / ED DIAGNOSES  Final diagnoses:  Generalized weakness  Acute cystitis without hematuria  Elevated INR     MEDICATIONS GIVEN DURING THIS VISIT:  Medications  cefTRIAXone (ROCEPHIN) 1 g in sodium chloride 0.9 % 100 mL IVPB (0 g Intravenous Stopped 03/30/18 1613)     NEW OUTPATIENT MEDICATIONS STARTED DURING THIS VISIT:  New Prescriptions   CEPHALEXIN (KEFLEX) 500 MG CAPSULE    Take 1 capsule (500 mg total) by mouth 2 (two) times daily for 7 days.    Note:  This document was prepared  using Dragon voice recognition software and may include unintentional dictation errors.  Alona Bene, MD Emergency Medicine    Long, Arlyss Repress, MD 03/31/18 1240

## 2018-03-30 NOTE — Discharge Instructions (Signed)

## 2018-03-30 NOTE — ED Triage Notes (Signed)
Pt reports generalized weakness starting this AM.  Pt received HD yesterday and states that "they told me I passed out during dialysis."   Pts son states that pt reported she felt like she was going to pass out.  Pt is A&Ox4 upon arrival to ED

## 2018-03-30 NOTE — ED Notes (Signed)
Given pt. [place] sandwich and diet ginger ale

## 2018-03-31 DIAGNOSIS — D509 Iron deficiency anemia, unspecified: Secondary | ICD-10-CM | POA: Diagnosis not present

## 2018-03-31 DIAGNOSIS — N186 End stage renal disease: Secondary | ICD-10-CM | POA: Diagnosis not present

## 2018-03-31 DIAGNOSIS — D631 Anemia in chronic kidney disease: Secondary | ICD-10-CM | POA: Diagnosis not present

## 2018-03-31 DIAGNOSIS — E1129 Type 2 diabetes mellitus with other diabetic kidney complication: Secondary | ICD-10-CM | POA: Diagnosis not present

## 2018-03-31 DIAGNOSIS — N2581 Secondary hyperparathyroidism of renal origin: Secondary | ICD-10-CM | POA: Diagnosis not present

## 2018-04-02 DIAGNOSIS — N2581 Secondary hyperparathyroidism of renal origin: Secondary | ICD-10-CM | POA: Diagnosis not present

## 2018-04-02 DIAGNOSIS — D631 Anemia in chronic kidney disease: Secondary | ICD-10-CM | POA: Diagnosis not present

## 2018-04-02 DIAGNOSIS — E1129 Type 2 diabetes mellitus with other diabetic kidney complication: Secondary | ICD-10-CM | POA: Diagnosis not present

## 2018-04-02 DIAGNOSIS — N186 End stage renal disease: Secondary | ICD-10-CM | POA: Diagnosis not present

## 2018-04-02 DIAGNOSIS — D509 Iron deficiency anemia, unspecified: Secondary | ICD-10-CM | POA: Diagnosis not present

## 2018-04-05 ENCOUNTER — Telehealth: Payer: Self-pay | Admitting: Internal Medicine

## 2018-04-05 ENCOUNTER — Other Ambulatory Visit: Payer: Self-pay | Admitting: Internal Medicine

## 2018-04-05 DIAGNOSIS — N2581 Secondary hyperparathyroidism of renal origin: Secondary | ICD-10-CM | POA: Diagnosis not present

## 2018-04-05 DIAGNOSIS — D509 Iron deficiency anemia, unspecified: Secondary | ICD-10-CM | POA: Diagnosis not present

## 2018-04-05 DIAGNOSIS — N186 End stage renal disease: Secondary | ICD-10-CM | POA: Diagnosis not present

## 2018-04-05 DIAGNOSIS — D631 Anemia in chronic kidney disease: Secondary | ICD-10-CM | POA: Diagnosis not present

## 2018-04-05 DIAGNOSIS — E1129 Type 2 diabetes mellitus with other diabetic kidney complication: Secondary | ICD-10-CM | POA: Diagnosis not present

## 2018-04-05 NOTE — Telephone Encounter (Signed)
Faxed to [facility] signed orders pertaining to INR @ [phone]

## 2018-04-07 DIAGNOSIS — D509 Iron deficiency anemia, unspecified: Secondary | ICD-10-CM | POA: Diagnosis not present

## 2018-04-07 DIAGNOSIS — E1129 Type 2 diabetes mellitus with other diabetic kidney complication: Secondary | ICD-10-CM | POA: Diagnosis not present

## 2018-04-07 DIAGNOSIS — D631 Anemia in chronic kidney disease: Secondary | ICD-10-CM | POA: Diagnosis not present

## 2018-04-07 DIAGNOSIS — N2581 Secondary hyperparathyroidism of renal origin: Secondary | ICD-10-CM | POA: Diagnosis not present

## 2018-04-07 DIAGNOSIS — N186 End stage renal disease: Secondary | ICD-10-CM | POA: Diagnosis not present

## 2018-04-09 DIAGNOSIS — D631 Anemia in chronic kidney disease: Secondary | ICD-10-CM | POA: Diagnosis not present

## 2018-04-09 DIAGNOSIS — N2581 Secondary hyperparathyroidism of renal origin: Secondary | ICD-10-CM | POA: Diagnosis not present

## 2018-04-09 DIAGNOSIS — D509 Iron deficiency anemia, unspecified: Secondary | ICD-10-CM | POA: Diagnosis not present

## 2018-04-09 DIAGNOSIS — N186 End stage renal disease: Secondary | ICD-10-CM | POA: Diagnosis not present

## 2018-04-09 DIAGNOSIS — E1129 Type 2 diabetes mellitus with other diabetic kidney complication: Secondary | ICD-10-CM | POA: Diagnosis not present

## 2018-04-12 DIAGNOSIS — D509 Iron deficiency anemia, unspecified: Secondary | ICD-10-CM | POA: Diagnosis not present

## 2018-04-12 DIAGNOSIS — D631 Anemia in chronic kidney disease: Secondary | ICD-10-CM | POA: Diagnosis not present

## 2018-04-12 DIAGNOSIS — E1129 Type 2 diabetes mellitus with other diabetic kidney complication: Secondary | ICD-10-CM | POA: Diagnosis not present

## 2018-04-12 DIAGNOSIS — N186 End stage renal disease: Secondary | ICD-10-CM | POA: Diagnosis not present

## 2018-04-12 DIAGNOSIS — N2581 Secondary hyperparathyroidism of renal origin: Secondary | ICD-10-CM | POA: Diagnosis not present

## 2018-04-14 DIAGNOSIS — D631 Anemia in chronic kidney disease: Secondary | ICD-10-CM | POA: Diagnosis not present

## 2018-04-14 DIAGNOSIS — D509 Iron deficiency anemia, unspecified: Secondary | ICD-10-CM | POA: Diagnosis not present

## 2018-04-14 DIAGNOSIS — N186 End stage renal disease: Secondary | ICD-10-CM | POA: Diagnosis not present

## 2018-04-14 DIAGNOSIS — N2581 Secondary hyperparathyroidism of renal origin: Secondary | ICD-10-CM | POA: Diagnosis not present

## 2018-04-14 DIAGNOSIS — E1129 Type 2 diabetes mellitus with other diabetic kidney complication: Secondary | ICD-10-CM | POA: Diagnosis not present

## 2018-04-16 DIAGNOSIS — E1129 Type 2 diabetes mellitus with other diabetic kidney complication: Secondary | ICD-10-CM | POA: Diagnosis not present

## 2018-04-16 DIAGNOSIS — D509 Iron deficiency anemia, unspecified: Secondary | ICD-10-CM | POA: Diagnosis not present

## 2018-04-16 DIAGNOSIS — N2581 Secondary hyperparathyroidism of renal origin: Secondary | ICD-10-CM | POA: Diagnosis not present

## 2018-04-16 DIAGNOSIS — D631 Anemia in chronic kidney disease: Secondary | ICD-10-CM | POA: Diagnosis not present

## 2018-04-16 DIAGNOSIS — N186 End stage renal disease: Secondary | ICD-10-CM | POA: Diagnosis not present

## 2018-04-18 ENCOUNTER — Ambulatory Visit (INDEPENDENT_AMBULATORY_CARE_PROVIDER_SITE_OTHER)
Admission: RE | Admit: 2018-04-18 | Discharge: 2018-04-18 | Disposition: A | Discharge status: Home / Self Care | Payer: Medicare Other | Source: Ambulatory Visit | Attending: Surgery | Admitting: Surgery

## 2018-04-18 ENCOUNTER — Other Ambulatory Visit: Payer: Self-pay

## 2018-04-18 ENCOUNTER — Encounter: Payer: Self-pay | Admitting: Surgery

## 2018-04-18 ENCOUNTER — Ambulatory Visit (INDEPENDENT_AMBULATORY_CARE_PROVIDER_SITE_OTHER): Payer: Medicare Other | Admitting: Surgery

## 2018-04-18 ENCOUNTER — Ambulatory Visit (HOSPITAL_COMMUNITY)
Admission: RE | Admit: 2018-04-18 | Discharge: 2018-04-18 | Disposition: A | Discharge status: Home / Self Care | Payer: Medicare Other | Source: Ambulatory Visit | Attending: Surgery | Admitting: Surgery

## 2018-04-18 VITALS — BP 148/71 | HR 93 | Temp 96.9°F | Resp 14 | Ht 68.0 in | Wt 135.0 lb

## 2018-04-18 DIAGNOSIS — I739 Peripheral vascular disease, unspecified: Secondary | ICD-10-CM

## 2018-04-18 DIAGNOSIS — I7025 Atherosclerosis of native arteries of other extremities with ulceration: Secondary | ICD-10-CM

## 2018-04-18 DIAGNOSIS — Z89512 Acquired absence of left leg below knee: Secondary | ICD-10-CM | POA: Insufficient documentation

## 2018-04-18 DIAGNOSIS — E119 Type 2 diabetes mellitus without complications: Secondary | ICD-10-CM | POA: Diagnosis not present

## 2018-04-18 DIAGNOSIS — I10 Essential (primary) hypertension: Secondary | ICD-10-CM | POA: Diagnosis not present

## 2018-04-18 NOTE — Progress Notes (Signed)
Vitals:   04/18/18 1448  BP: (!) 146/65  Pulse: 83  Resp: 14  Temp: (!) 96.9 F (36.1 C)  TempSrc: Oral  SpO2: 99%  Weight: 135 lb (61.2 kg)  Height: 5\' 8"  (1.727 m)

## 2018-04-18 NOTE — Progress Notes (Signed)
[facility] [place]  Patient name: Jackie [name] MRN: [id number] DOB: Dec 21, 1934 Sex: female   REASON FOR VISIT:    Follow up  [place]:    Jackie [name] is a 83 y.o. female with end-stage renal disease who I met in 2016 when she presented with bilateral lower extremity ulcers.  I felt that she was at high risk for bilateral amputation based on the appearance of her wounds.   She underwent an attempted revascularization on the right but was unsuccessful.  She went for wound care and was able to heal the wounds on her right side.  She ultimately underwent below-knee amputation by Dr. due to on the left.  The patient recently developed blisters and draiange on her right foot.She also has had some drainage from her below-knee amputation site on the left. She underwent angiography on 12/01/2017 and had atherectomy with angioplasty of her right superficial femoral artery.  She states her wounds are improving and being managed by [facility].  She has tried hyperbaric therapy in the past but could not tolerate it secondary to claustrophobia.  PAST MEDICAL HISTORY:   Past Medical History:  Diagnosis Date  . Anemia   . Arthritis   . Diabetes mellitus   . End-stage renal disease on hemodialysis ([place])    dialysis T/Th/Sa  . Hyperlipidemia   . Hypertension   . Pneumonia   . Thyroid disease    pt denies this     FAMILY HISTORY:   Family History  Problem Relation Age of Onset  . Diabetes Mother   . Heart disease Mother   . Hypertension Mother     SOCIAL HISTORY:   Social History   Tobacco Use  . Smoking status: Never Smoker  . Smokeless tobacco: Never Used  Substance Use Topics  . Alcohol use: No     ALLERGIES:   No Known Allergies   CURRENT MEDICATIONS:   Current Outpatient Medications  Medication Sig Dispense Refill  . albuterol (PROVENTIL HFA;VENTOLIN HFA) 108 (90 BASE)  MCG/ACT inhaler Inhale 1-2 puffs into the lungs every 6 (six) hours as needed for wheezing. 1 Inhaler 0  . cefTAZidime 2 g in dextrose 5 % 50 mL Inject 2 g into the vein Every Tuesday,Thursday,and Saturday with dialysis.    Marland Kitchen cinacalcet (SENSIPAR) 30 MG tablet Take 30 mg by mouth daily.    . clopidogrel (PLAVIX) 75 MG tablet TAKE ONE TABLET   BY MOUTH   DAILY 30 tablet 9  . insulin detemir (LEVEMIR) 100 UNIT/ML injection Inject 0.1 mLs (10 Units total) into the skin at bedtime. (Patient taking differently: Inject 15 Units into the skin at bedtime. ) 10 mL 11  . lidocaine-prilocaine (EMLA) cream Apply 1 application topically daily as needed (prior to dialysis).   11  . metoprolol tartrate (LOPRESSOR) 50 MG tablet Take 50 mg by mouth 2 (two) times daily.    . promethazine (PHENERGAN) 12.5 MG tablet Take 12.5 mg by mouth daily as needed for nausea or vomiting.     . saccharomyces boulardii (FLORASTOR) 250 MG capsule Take 1 capsule (250 mg total) by mouth 2 (two) times daily. 30 capsule 0  . sevelamer carbonate (RENVELA) 2.4 g PACK Take 2.4 g by mouth 3 (three) times daily with meals.     . warfarin (COUMADIN) 5 MG tablet TAKE 1 to 1.5 TABLETS BY MOUTH DAILY AS DIRECTED BY THE COUMADIN CLINIC. 45 tablet 0  . oxyCODONE (ROXICODONE)  5 MG immediate release tablet Take 1 tablet (5 mg total) by mouth every 4 (four) hours as needed for severe pain. (Patient not taking: Reported on 04/18/2018) 20 tablet 0  . oxyCODONE-acetaminophen (ROXICET) 5-325 MG tablet Take 1 tablet by mouth every 4 (four) hours as needed for severe pain. (Patient not taking: Reported on 04/18/2018) 60 tablet 0  . XARELTO 10 MG TABS tablet      No current facility-administered medications for this visit.     REVIEW OF SYSTEMS:   _0  denotes positive finding, _1  denotes negative finding Cardiac  Comments:  Chest pain or chest pressure:    Shortness of breath upon exertion:    Short of breath when lying flat:    Irregular heart  rhythm:        Vascular    Pain in calf, thigh, or hip brought on by ambulation:    Pain in feet at night that wakes you up from your sleep:     Blood clot in your veins:    Leg swelling:         Pulmonary    Oxygen at home:    Productive cough:     Wheezing:         Neurologic    Sudden weakness in arms or legs:     Sudden numbness in arms or legs:     Sudden onset of difficulty speaking or slurred speech:    Temporary loss of vision in one eye:     Problems with dizziness:         Gastrointestinal    Blood in stool:     Vomited blood:         Genitourinary    Burning when urinating:     Blood in urine:        Psychiatric    Major depression:         Hematologic    Bleeding problems:    Problems with blood clotting too easily:        Skin    Rashes or ulcers:        Constitutional    Fever or chills:      PHYSICAL EXAM:   Vitals:   04/18/18 1448 04/18/18 1452  BP: (!) 146/65 (!) 148/71  Pulse: 83 93  Resp: 14   Temp: (!) 96.9 F (36.1 C)   TempSrc: Oral   SpO2: 99%   Weight: 135 lb (61.2 kg)   Height: _2  (1.727 m)     GENERAL: The patient is a well-nourished female, in no acute distress. The vital signs are documented above. CARDIAC: There is a regular rate and rhythm.  VASCULAR: Nonpalpable pedal pulses PULMONARY: Non-labored respirations MUSCULOSKELETAL: Left below-knee amputation is healed NEUROLOGIC: No focal weakness or paresthesias are detected. SKIN: Dry eschar remains on the right foot with no obvious open wounds PSYCHIATRIC: The patient has a normal affect.  STUDIES:   I have reviewed her vascular lab studies with the following findings:  ABI Findings: +---------+------------------+-----+----------+--------+ Right  Rt Pressure (mmHg)IndexWaveform Comment  +---------+------------------+-----+----------+--------+ Brachial 178                      +---------+------------------+-----+----------+--------+ PTA   250        1.40 monophasicdampened +---------+------------------+-----+----------+--------+ DP    > 254       1.43 monophasicdampened +---------+------------------+-----+----------+--------+ Great Toe            Abnormal *     +---------+------------------+-----+----------+--------+  Right: 75 - 99% mid to distal SFA stenosis, probable upper end of range. Known tibial artery occlusive disease. Calcified plaque with accoustic shadowing prevents thorough visualization.   MEDICAL ISSUES:   Patient continues to improve.  Her wounds have nearly healed.  There have been being managed by [facility].  Her ultrasound study today suggest stenosis within the superficial femoral artery.  I would not favor re-intervention at this time, as she does not have any outflow, and her wounds appear to be healing.  She will come back in 6 months for repeat duplex.  If when she returns, she has not healed her wounds or is getting worse, angiography would be indicated.    Annamarie Major, MD [facility] [phone] Pager [phone]

## 2018-04-19 DIAGNOSIS — N186 End stage renal disease: Secondary | ICD-10-CM | POA: Diagnosis not present

## 2018-04-19 DIAGNOSIS — N2581 Secondary hyperparathyroidism of renal origin: Secondary | ICD-10-CM | POA: Diagnosis not present

## 2018-04-19 DIAGNOSIS — E1129 Type 2 diabetes mellitus with other diabetic kidney complication: Secondary | ICD-10-CM | POA: Diagnosis not present

## 2018-04-19 DIAGNOSIS — D509 Iron deficiency anemia, unspecified: Secondary | ICD-10-CM | POA: Diagnosis not present

## 2018-04-19 DIAGNOSIS — Z992 Dependence on renal dialysis: Secondary | ICD-10-CM | POA: Diagnosis not present

## 2018-04-19 DIAGNOSIS — Z23 Encounter for immunization: Secondary | ICD-10-CM | POA: Diagnosis not present

## 2018-04-19 DIAGNOSIS — D631 Anemia in chronic kidney disease: Secondary | ICD-10-CM | POA: Diagnosis not present

## 2018-04-23 DIAGNOSIS — E1129 Type 2 diabetes mellitus with other diabetic kidney complication: Secondary | ICD-10-CM | POA: Diagnosis not present

## 2018-04-23 DIAGNOSIS — D509 Iron deficiency anemia, unspecified: Secondary | ICD-10-CM | POA: Diagnosis not present

## 2018-04-23 DIAGNOSIS — N2581 Secondary hyperparathyroidism of renal origin: Secondary | ICD-10-CM | POA: Diagnosis not present

## 2018-04-23 DIAGNOSIS — Z23 Encounter for immunization: Secondary | ICD-10-CM | POA: Diagnosis not present

## 2018-04-23 DIAGNOSIS — D631 Anemia in chronic kidney disease: Secondary | ICD-10-CM | POA: Diagnosis not present

## 2018-04-23 DIAGNOSIS — N186 End stage renal disease: Secondary | ICD-10-CM | POA: Diagnosis not present

## 2018-04-26 ENCOUNTER — Observation Stay (HOSPITAL_COMMUNITY)
Admission: EM | Admit: 2018-04-26 | Discharge: 2018-04-27 | Disposition: A | Discharge status: Home / Self Care | Payer: Medicare Other | Attending: Internal Medicine | Admitting: Internal Medicine

## 2018-04-26 ENCOUNTER — Encounter (HOSPITAL_COMMUNITY): Payer: Self-pay | Admitting: Emergency Medicine

## 2018-04-26 ENCOUNTER — Telehealth: Payer: Self-pay | Admitting: Pharmacist

## 2018-04-26 ENCOUNTER — Other Ambulatory Visit: Payer: Self-pay

## 2018-04-26 ENCOUNTER — Emergency Department (HOSPITAL_COMMUNITY): Payer: Medicare Other

## 2018-04-26 DIAGNOSIS — Z992 Dependence on renal dialysis: Secondary | ICD-10-CM | POA: Insufficient documentation

## 2018-04-26 DIAGNOSIS — Z23 Encounter for immunization: Secondary | ICD-10-CM | POA: Diagnosis not present

## 2018-04-26 DIAGNOSIS — R404 Transient alteration of awareness: Secondary | ICD-10-CM | POA: Diagnosis not present

## 2018-04-26 DIAGNOSIS — D649 Anemia, unspecified: Secondary | ICD-10-CM | POA: Diagnosis present

## 2018-04-26 DIAGNOSIS — E1129 Type 2 diabetes mellitus with other diabetic kidney complication: Secondary | ICD-10-CM | POA: Diagnosis not present

## 2018-04-26 DIAGNOSIS — R55 Syncope and collapse: Secondary | ICD-10-CM | POA: Diagnosis not present

## 2018-04-26 DIAGNOSIS — Z7902 Long term (current) use of antithrombotics/antiplatelets: Secondary | ICD-10-CM | POA: Insufficient documentation

## 2018-04-26 DIAGNOSIS — Z79899 Other long term (current) drug therapy: Secondary | ICD-10-CM | POA: Insufficient documentation

## 2018-04-26 DIAGNOSIS — N2581 Secondary hyperparathyroidism of renal origin: Secondary | ICD-10-CM | POA: Diagnosis not present

## 2018-04-26 DIAGNOSIS — D631 Anemia in chronic kidney disease: Secondary | ICD-10-CM | POA: Insufficient documentation

## 2018-04-26 DIAGNOSIS — Z89512 Acquired absence of left leg below knee: Secondary | ICD-10-CM | POA: Diagnosis not present

## 2018-04-26 DIAGNOSIS — I959 Hypotension, unspecified: Secondary | ICD-10-CM | POA: Diagnosis not present

## 2018-04-26 DIAGNOSIS — R569 Unspecified convulsions: Secondary | ICD-10-CM | POA: Diagnosis not present

## 2018-04-26 DIAGNOSIS — R4020 Unspecified coma: Secondary | ICD-10-CM | POA: Diagnosis not present

## 2018-04-26 DIAGNOSIS — I120 Hypertensive chronic kidney disease with stage 5 chronic kidney disease or end stage renal disease: Secondary | ICD-10-CM | POA: Diagnosis not present

## 2018-04-26 DIAGNOSIS — I4891 Unspecified atrial fibrillation: Secondary | ICD-10-CM | POA: Diagnosis present

## 2018-04-26 DIAGNOSIS — E119 Type 2 diabetes mellitus without complications: Secondary | ICD-10-CM

## 2018-04-26 DIAGNOSIS — Z7901 Long term (current) use of anticoagulants: Secondary | ICD-10-CM

## 2018-04-26 DIAGNOSIS — Z794 Long term (current) use of insulin: Secondary | ICD-10-CM | POA: Insufficient documentation

## 2018-04-26 DIAGNOSIS — N186 End stage renal disease: Secondary | ICD-10-CM

## 2018-04-26 DIAGNOSIS — E1122 Type 2 diabetes mellitus with diabetic chronic kidney disease: Secondary | ICD-10-CM | POA: Insufficient documentation

## 2018-04-26 DIAGNOSIS — D509 Iron deficiency anemia, unspecified: Secondary | ICD-10-CM | POA: Diagnosis not present

## 2018-04-26 DIAGNOSIS — R52 Pain, unspecified: Secondary | ICD-10-CM | POA: Diagnosis not present

## 2018-04-26 HISTORY — DX: Acquired absence of left leg below knee: Z89.512

## 2018-04-26 LAB — I-STAT TROPONIN, ED: TROPONIN I, POC: 0.09 ng/mL — AB (ref 0.00–0.08)

## 2018-04-26 LAB — COMPREHENSIVE METABOLIC PANEL
ALBUMIN: 2.8 g/dL — AB (ref 3.5–5.0)
ALT: 14 U/L (ref 0–44)
AST: 33 U/L (ref 15–41)
Alkaline Phosphatase: 66 U/L (ref 38–126)
Anion gap: 16 — ABNORMAL HIGH (ref 5–15)
BUN: 14 mg/dL (ref 8–23)
CO2: 25 mmol/L (ref 22–32)
CREATININE: 4.3 mg/dL — AB (ref 0.44–1.00)
Calcium: 8.4 mg/dL — ABNORMAL LOW (ref 8.9–10.3)
Chloride: 96 mmol/L — ABNORMAL LOW (ref 98–111)
GFR calc Af Amer: 10 mL/min — ABNORMAL LOW (ref >60)
GFR calc non Af Amer: 9 mL/min — ABNORMAL LOW (ref >60)
GLUCOSE: 97 mg/dL (ref 70–99)
POTASSIUM: 3 mmol/L — AB (ref 3.5–5.1)
SODIUM: 137 mmol/L (ref 135–145)
Total Bilirubin: 1 mg/dL (ref 0.3–1.2)
Total Protein: 6.8 g/dL (ref 6.5–8.1)

## 2018-04-26 LAB — GLUCOSE, CAPILLARY: Glucose-Capillary: 121 mg/dL — ABNORMAL HIGH (ref 70–99)

## 2018-04-26 LAB — HEMOGLOBIN A1C
Hgb A1c MFr Bld: 5.4 % (ref 4.8–5.6)
MEAN PLASMA GLUCOSE: 108.28 mg/dL

## 2018-04-26 LAB — DIFFERENTIAL
Abs Immature Granulocytes: 0.02 10*3/uL (ref 0.00–0.07)
BASOS PCT: 0 %
Basophils Absolute: 0 10*3/uL (ref 0.0–0.1)
EOS ABS: 0 10*3/uL (ref 0.0–0.5)
EOS PCT: 1 %
Immature Granulocytes: 0 %
Lymphocytes Relative: 13 %
Lymphs Abs: 0.7 10*3/uL (ref 0.7–4.0)
MONOS PCT: 8 %
Monocytes Absolute: 0.5 10*3/uL (ref 0.1–1.0)
NEUTROS PCT: 78 %
Neutro Abs: 4.3 10*3/uL (ref 1.7–7.7)

## 2018-04-26 LAB — CBC
HCT: 32.1 % — ABNORMAL LOW (ref 36.0–46.0)
Hemoglobin: 9.8 g/dL — ABNORMAL LOW (ref 12.0–15.0)
MCH: 28.5 pg (ref 26.0–34.0)
MCHC: 30.5 g/dL (ref 30.0–36.0)
MCV: 93.3 fL (ref 80.0–100.0)
NRBC: 0 % (ref 0.0–0.2)
PLATELETS: 86 10*3/uL — AB (ref 150–400)
RBC: 3.44 MIL/uL — ABNORMAL LOW (ref 3.87–5.11)
RDW: 16.7 % — AB (ref 11.5–15.5)
WBC: 5.6 10*3/uL (ref 4.0–10.5)

## 2018-04-26 LAB — APTT: APTT: 41 s — AB (ref 24–36)

## 2018-04-26 LAB — PROTIME-INR
INR: 3.69
Prothrombin Time: 36.3 seconds — ABNORMAL HIGH (ref 11.4–15.2)

## 2018-04-26 LAB — CBG MONITORING, ED: GLUCOSE-CAPILLARY: 94 mg/dL (ref 70–99)

## 2018-04-26 LAB — ETHANOL: Alcohol, Ethyl (B): <10 mg/dL (ref <10)

## 2018-04-26 MED ORDER — NEPRO/CARBSTEADY PO LIQD: 237.0000 mL | Freq: Three times a day (TID) | ORAL | Status: DC | PRN

## 2018-04-26 MED ORDER — CALCIUM ACETATE (PHOS BINDER) 667 MG PO CAPS
2001.0000 mg | ORAL_CAPSULE | Freq: Three times a day (TID) | ORAL | Status: DC
  Administered 2018-04-27 (×2): 2001 mg via ORAL
  Filled 2018-04-26 (×2): qty 3

## 2018-04-26 MED ORDER — ONDANSETRON HCL 4 MG PO TABS: 4.0000 mg | ORAL_TABLET | Freq: Four times a day (QID) | ORAL | Status: DC | PRN

## 2018-04-26 MED ORDER — ZOLPIDEM TARTRATE 5 MG PO TABS
5.0000 mg | ORAL_TABLET | Freq: Every evening | ORAL | Status: DC | PRN
  Administered 2018-04-26: 5 mg via ORAL
  Filled 2018-04-26: qty 1

## 2018-04-26 MED ORDER — INSULIN ASPART 100 UNIT/ML ~~LOC~~ SOLN: 0.0000 [IU] | Freq: Every day | SUBCUTANEOUS | Status: DC

## 2018-04-26 MED ORDER — INSULIN DETEMIR 100 UNIT/ML ~~LOC~~ SOLN
15.0000 [IU] | Freq: Every day | SUBCUTANEOUS | Status: DC
  Administered 2018-04-26: 15 [IU] via SUBCUTANEOUS
  Filled 2018-04-26: qty 0.15

## 2018-04-26 MED ORDER — ACETAMINOPHEN 325 MG PO TABS: 650.0000 mg | ORAL_TABLET | Freq: Four times a day (QID) | ORAL | Status: DC | PRN

## 2018-04-26 MED ORDER — DOCUSATE SODIUM 283 MG RE ENEM
1.0000 | ENEMA | RECTAL | Status: DC | PRN
  Filled 2018-04-26: qty 1

## 2018-04-26 MED ORDER — RENA-VITE PO TABS
1.0000 | ORAL_TABLET | Freq: Every day | ORAL | Status: DC
  Administered 2018-04-26: 1 via ORAL
  Filled 2018-04-26: qty 1

## 2018-04-26 MED ORDER — ALBUTEROL SULFATE (2.5 MG/3ML) 0.083% IN NEBU: 2.5000 mg | INHALATION_SOLUTION | Freq: Four times a day (QID) | RESPIRATORY_TRACT | Status: DC | PRN

## 2018-04-26 MED ORDER — WARFARIN SODIUM 7.5 MG PO TABS: 7.5000 mg | ORAL_TABLET | Freq: Every day | ORAL | Status: DC

## 2018-04-26 MED ORDER — SORBITOL 70 % SOLN
30.0000 mL | Status: DC | PRN
  Filled 2018-04-26: qty 30

## 2018-04-26 MED ORDER — ACETAMINOPHEN 650 MG RE SUPP: 650.0000 mg | Freq: Four times a day (QID) | RECTAL | Status: DC | PRN

## 2018-04-26 MED ORDER — CINACALCET HCL 30 MG PO TABS
30.0000 mg | ORAL_TABLET | Freq: Every day | ORAL | Status: DC
  Administered 2018-04-27: 30 mg via ORAL
  Filled 2018-04-26: qty 1

## 2018-04-26 MED ORDER — SODIUM CHLORIDE 0.9% FLUSH
3.0000 mL | Freq: Two times a day (BID) | INTRAVENOUS | Status: DC
  Administered 2018-04-26 – 2018-04-27 (×2): 3 mL via INTRAVENOUS

## 2018-04-26 MED ORDER — ONDANSETRON HCL 4 MG/2ML IJ SOLN: 4.0000 mg | Freq: Four times a day (QID) | INTRAMUSCULAR | Status: DC | PRN

## 2018-04-26 MED ORDER — ENOXAPARIN SODIUM 30 MG/0.3ML ~~LOC~~ SOLN: 30.0000 mg | SUBCUTANEOUS | Status: DC

## 2018-04-26 MED ORDER — CAMPHOR-MENTHOL 0.5-0.5 % EX LOTN
1.0000 "application " | TOPICAL_LOTION | Freq: Three times a day (TID) | CUTANEOUS | Status: DC | PRN
  Filled 2018-04-26: qty 222

## 2018-04-26 MED ORDER — SEVELAMER CARBONATE 2.4 G PO PACK
2.4000 g | PACK | Freq: Three times a day (TID) | ORAL | Status: DC
  Administered 2018-04-27 (×2): 2.4 g via ORAL
  Filled 2018-04-26 (×3): qty 1

## 2018-04-26 MED ORDER — HYDROXYZINE HCL 25 MG PO TABS: 25.0000 mg | ORAL_TABLET | Freq: Three times a day (TID) | ORAL | Status: DC | PRN

## 2018-04-26 MED ORDER — INSULIN ASPART 100 UNIT/ML ~~LOC~~ SOLN: 0.0000 [IU] | Freq: Three times a day (TID) | SUBCUTANEOUS | Status: DC

## 2018-04-26 MED ORDER — CALCIUM CARBONATE ANTACID 1250 MG/5ML PO SUSP
500.0000 mg | Freq: Four times a day (QID) | ORAL | Status: DC | PRN
  Administered 2018-04-27: 500 mg via ORAL
  Filled 2018-04-26 (×2): qty 5

## 2018-04-26 MED ORDER — WARFARIN - PHARMACIST DOSING INPATIENT: Freq: Every day | Status: DC

## 2018-04-26 NOTE — ED Triage Notes (Signed)
Per [facility] pt coming from dialysis, reports pt had a syncopal episode. Staff states BP dropped and pt reported not feeling well and stopped treatment. EMS reports pt being very lethargic and placed on nonrebreather.

## 2018-04-26 NOTE — Progress Notes (Signed)
ANTICOAGULATION CONSULT NOTE - Initial Consult  Pharmacy Consult for warfarin Indication: atrial fibrillation  No Known Allergies  Patient Measurements: Height: 5\' 8"  (172.7 cm) Weight: 134 lb 7.7 oz (61 kg) IBW/kg (Calculated) : 63.9  Vital Signs: Temp: 97 F (36.1 C) (10/08 1507) Temp Source: Oral (10/08 1507) BP: 134/80 (10/08 1830) Pulse Rate: 107 (10/08 1830)  Labs: Recent Labs    04/26/18 1537  HGB 9.8*  HCT 32.1*  PLT 86*  APTT 41*  LABPROT 36.3*  INR 3.69  CREATININE 4.30*    Estimated Creatinine Clearance: 9.9 mL/min (A) (by C-G formula based on SCr of 4.3 mg/dL (H)).   Medical History: Past Medical History:  Diagnosis Date  . Anemia   . Arthritis   . Diabetes mellitus   . End-stage renal disease on hemodialysis (HCC)    dialysis T/Th/Sa  . Hx of BKA, left (HCC)   . Hyperlipidemia   . Hypertension   . Pneumonia     Medications:  Scheduled:  . [START ON 04/27/2018] calcium acetate  2,001 mg Oral TID WC  . [START ON 04/27/2018] cinacalcet  30 mg Oral Daily  . insulin aspart  0-5 Units Subcutaneous QHS  . [START ON 04/27/2018] insulin aspart  0-9 Units Subcutaneous TID WC  . insulin detemir  15 Units Subcutaneous QHS  . multivitamin  1 tablet Oral QHS  . [START ON 04/27/2018] sevelamer carbonate  2.4 g Oral TID WC  . sodium chloride flush  3 mL Intravenous Q12H  . Warfarin - Pharmacist Dosing Inpatient   Does not apply q1800    Assessment: Pt is 70 YOF presenting with syncopal episode during HD today. PMH is significant for HTN, HLD, DM, PVD s/p amputation, Afib on Coumadin, and ESRD on HD TTS. PTA warfarin for Afib; INR goal 2-3. Pt states takes 7.5 mg everyday. Last anti-coag note states pt to take 7.5 mg S/M/T/Th/F/S and 5 mg on Wednesday. Pt took dose today and INR is 3.69. Will hold dose and obtain INR tomorrow.  Goal of Therapy:  INR 2-3 Monitor platelets by anticoagulation protocol: Yes   Plan:  Hold warfarin dose since pt took already  today and has supratherapeutic INR of 3.69. Obtain daily INR Obtain CBC every 3 days  Koleen Nimrod 04/26/2018,6:42 PM

## 2018-04-26 NOTE — ED Notes (Signed)
Dinner tray ordered.

## 2018-04-26 NOTE — ED Provider Notes (Signed)
[facility] [address] [facility] EMERGENCY DEPARTMENT Provider Note   CSN: [id number] Arrival date & time: 04/26/18  1454     History   Chief Complaint Chief Complaint  Patient presents with  . Loss of Consciousness    HPI Jackie [name] is a 83 y.o. female.  HPI Patient presents to the emergency room for evaluation of a syncopal episode.  Patient was getting dialysis today.  During the treatment patient's blood pressure dropped.  She reported not feeling well and the patient became very listless and lethargic.  EMS was called.  No reported seizure activity they noted the patient to be listless so they put her on oxygen and brought her into the emergency room.  Patient's speech is very slow and slurred and difficult to understand but she remembers not feeling well during dialysis.   She feels weak all over. Patient denies having any trouble with any headache or chest pain.  Denies any abdominal pain.  No fevers. Past Medical History:  Diagnosis Date  . Anemia   . Arthritis   . Diabetes mellitus   . End-stage renal disease on hemodialysis (HCC)    dialysis T/Th/Sa  . Hyperlipidemia   . Hypertension   . Pneumonia   . Thyroid disease    pt denies this    Patient Active Problem List   Diagnosis Date Noted  . Long term (current) use of anticoagulants [Z79.01] 07/30/2017  . Atrial fibrillation (HCC) 07/23/2017  . Medication management 07/23/2017  . Essential hypertension 07/23/2017  . Mixed hyperlipidemia 07/23/2017  . PAD (peripheral artery disease) (HCC) 07/23/2017  . S/P unilateral BKA (below knee amputation) (HCC) 04/24/2015  . 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 Leukocytosis, unspecified 04/22/2013  . HTN (hypertension), benign 04/18/2013  . Diabetes mellitus (HCC) 04/18/2013  . Syncope 04/18/2013  . Anemia 04/18/2013  . ESRD (end stage renal disease) on dialysis (HCC) 09/02/2011    Past Surgical History:  Procedure Laterality Date  . ABDOMINAL  AORTOGRAM W/LOWER EXTREMITY N/A 12/01/2017   Procedure: ABDOMINAL AORTOGRAM W/LOWER EXTREMITY;  Surgeon: Nada Libman, MD;  Location: [facility] INVASIVE CV LAB;  Service: Cardiovascular;  Laterality: N/A;  bilateral  . AMPUTATION Left 04/24/2015   Procedure: LEFT BELOW KNEE AMPUTATION;  Surgeon: Nadara Mustard, MD;  Location: [facility] OR;  Service: Orthopedics;  Laterality: Left;  . ARTERIOVENOUS GRAFT PLACEMENT Left    non function  . AV FISTULA PLACEMENT Right 01/30/2013   Procedure: ARTERIOVENOUS (AV) FISTULA CREATION;  Surgeon: Fransisco Hertz, MD;  Location: [facility] OR;  Service: Vascular;  Laterality: Right;  . AV FISTULA PLACEMENT Left 10/04/2017   Procedure: INSERTION OF ARTERIOVENOUS (AV) GORE-TEX GRAFT LEFT UPPER ARM;  Surgeon: Chuck Hint, MD;  Location[facility] OR;  Service: Vascular;  Laterality: Left;  . CESAREAN SECTION    . EYE SURGERY Bilateral    catarct  . FISTULA SUPERFICIALIZATION Right 04/24/2013   Procedure: FISTULA SUPERFICIALIZATION;  Surgeon: Larina Earthly, MD;  Location: [facility] OR;  Service: Vascular;  Laterality: Right;  . FISTULOGRAM Right 07/26/2013   Procedure: FISTULOGRAM;  Surgeon: Larina Earthly, MD;  Location: [facility] CATH LAB;  Service: Cardiovascular;  Laterality: Right;  . INSERTION OF DIALYSIS CATHETER Right 04/24/2013   Procedure: INSERTION OF DIALYSIS CATHETER;  Surgeon: Larina Earthly, MD;  Location: [facility] OR;  Service: Vascular;  Laterality: Right;  . PERIPHERAL VASCULAR ATHERECTOMY  12/01/2017   Procedure: PERIPHERAL VASCULAR ATHERECTOMY;  Surgeon: Nada Libman, MD;  Location: [facility]  INVASIVE CV LAB;  Service: Cardiovascular;;  Rt. SFA  . PERIPHERAL VASCULAR CATHETERIZATION N/A 12/05/2014   Procedure: Abdominal Aortogram;  Surgeon: Fransisco Hertz, MD;  Location: [facility] INVASIVE CV LAB;  Service: Cardiovascular;  Laterality: N/A;  . PERIPHERAL VASCULAR CATHETERIZATION N/A 03/19/2015   Procedure: Lower Extremity Angiography;  Surgeon: Nada Libman, MD;  Location: [facility] INVASIVE CV LAB;  Service:  Cardiovascular;  Laterality: N/A;  . PERIPHERAL VASCULAR CATHETERIZATION  03/19/2015   Procedure: Peripheral Vascular Intervention;  Surgeon: Nada Libman, MD;  Location: [facility] INVASIVE CV LAB;  Service: Cardiovascular;;  left sfa and popliteal stent  . TONSILLECTOMY    . UPPER EXTREMITY VENOGRAPHY N/A 09/17/2017   Procedure: UPPER EXTREMITY VENOGRAPHY;  Surgeon: Chuck Hint, MD;  Location: [facility] INVASIVE CV LAB;  Service: Cardiovascular;  Laterality: N/A;     OB History   None      Home Medications    Prior to Admission medications   Medication Sig Start Date End Date Taking? Authorizing Provider  albuterol (PROVENTIL HFA;VENTOLIN HFA) 108 (90 BASE) MCG/ACT inhaler Inhale 1-2 puffs into the lungs every 6 (six) hours as needed for wheezing. 12/27/12   Palumbo, April, MD  cefTAZidime 2 g in dextrose 5 % 50 mL Inject 2 g into the vein Every Tuesday,Thursday,and Saturday with dialysis. 12/08/14   Osvaldo Shipper, MD  cinacalcet (SENSIPAR) 30 MG tablet Take 30 mg by mouth daily.    [provider]  clopidogrel (PLAVIX) 75 MG tablet TAKE ONE TABLET   BY MOUTH   DAILY 02/15/17   Chuck Hint, MD  insulin detemir (LEVEMIR) 100 UNIT/ML injection Inject 0.1 mLs (10 Units total) into the skin at bedtime. Patient taking differently: Inject 15 Units into the skin at bedtime.  12/07/14   Osvaldo Shipper, MD  lidocaine-prilocaine (EMLA) cream Apply 1 application topically daily as needed (prior to dialysis).  11/24/17   [provider]  metoprolol tartrate (LOPRESSOR) 50 MG tablet Take 50 mg by mouth 2 (two) times daily. 08/19/17   [provider]  oxyCODONE (ROXICODONE) 5 MG immediate release tablet Take 1 tablet (5 mg total) by mouth every 4 (four) hours as needed for severe pain. Patient not taking: Reported on 04/18/2018 10/04/17   Chuck Hint, MD  oxyCODONE-acetaminophen (ROXICET) 5-325 MG tablet Take 1 tablet by mouth every 4 (four) hours as needed for  severe pain. Patient not taking: Reported on 04/18/2018 04/25/15   Nadara Mustard, MD  promethazine (PHENERGAN) 12.5 MG tablet Take 12.5 mg by mouth daily as needed for nausea or vomiting.  07/22/17   [provider]  saccharomyces boulardii (FLORASTOR) 250 MG capsule Take 1 capsule (250 mg total) by mouth 2 (two) times daily. 12/07/14   Osvaldo Shipper, MD  sevelamer carbonate (RENVELA) 2.4 g PACK Take 2.4 g by mouth 3 (three) times daily with meals.  09/06/17   [provider]  warfarin (COUMADIN) 5 MG tablet TAKE 1 to 1.5 TABLETS BY MOUTH DAILY AS DIRECTED BY THE COUMADIN CLINIC. 04/07/18   Hilty, Lisette Abu, MD    Family History Family History  Problem Relation Age of Onset  . Diabetes Mother   . Heart disease Mother   . Hypertension Mother     Social History Social History   Tobacco Use  . Smoking status: Never Smoker  . Smokeless tobacco: Never Used  Substance Use Topics  . Alcohol use: No  . Drug use: No     Allergies   Patient  has no known allergies.   Review of Systems Review of Systems  All other systems reviewed and are negative.    Physical Exam Updated Vital Signs BP (!) 145/79 (BP Location: Right Arm)   Pulse (!) 112   Temp (!) 97 F (36.1 C) (Oral)   Resp 20   Ht 1.727 m (5\' 8" )   Wt 61 kg   SpO2 99%   BMI 20.45 kg/m   Physical Exam  Constitutional: She appears listless.  HENT:  Head: Normocephalic and atraumatic.  Right Ear: External ear normal.  Left Ear: External ear normal.  Mucous membranes are dry  Eyes: Conjunctivae are normal. Right eye exhibits no discharge. Left eye exhibits no discharge. No scleral icterus.  Neck: Neck supple. No tracheal deviation present.  Cardiovascular: Normal rate, regular rhythm and intact distal pulses.  Pulmonary/Chest: Effort normal and breath sounds normal. No stridor. No respiratory distress. She has no wheezes. She has no rales.  Abdominal: Soft. Bowel sounds are normal. She exhibits no  distension. There is no tenderness. There is no rebound and no guarding.  Musculoskeletal: She exhibits no edema or tenderness.  S/p left sided bka  Neurological: She appears listless. No cranial nerve deficit (no facial droop, extraocular movements intact, speech is difficult to understand) or sensory deficit. She exhibits normal muscle tone. She displays no seizure activity.  Patient is oriented to person, place and time, she is listless but follows commands appropriately, she is able to move all extremities although with difficult effort  Skin: Skin is warm and dry. No rash noted. She is not diaphoretic.  Psychiatric: She has a normal mood and affect.  Nursing note and vitals reviewed.    ED Treatments / Results  Labs (all labs ordered are listed, but only abnormal results are displayed) Labs Reviewed - No data to display  EKG EKG Interpretation  Date/Time:  Tuesday April 26 2018 14:59:28 EDT Ventricular Rate:  113 PR Interval:    QRS Duration: 88 QT Interval:  355 QTC Calculation: 487 R Axis:   99 Text Interpretation:  Atrial fibrillation Multiple ventricular premature complexes Right axis deviation Borderline low voltage, extremity leads Probable anterolateral infarct, old Minimal ST depression, lateral leads Baseline wander in lead(s) V6 No significant change since last tracing Confirmed by Linwood Dibbles [phone]) on 04/26/2018 3:02:00 PM   Radiology Ct Head Wo Contrast  Result Date: 04/26/2018 CLINICAL DATA:  Syncopal episode during dialysis. EXAM: CT HEAD WITHOUT CONTRAST TECHNIQUE: Contiguous axial images were obtained from the base of the skull through the vertex without intravenous contrast. COMPARISON:  05/21/2010 FINDINGS: Brain: No evidence of acute infarction, hemorrhage, hydrocephalus, extra-axial collection or mass lesion/mass effect. Moderate brain parenchymal volume loss and deep white matter microangiopathy. Vascular: Calcific atherosclerotic disease of the intra  cavernous carotid arteries. Skull: Normal. Negative for fracture or focal lesion. Sinuses/Orbits: No acute finding. Other: None. IMPRESSION: No acute intracranial abnormality. Atrophy, chronic microvascular disease. Electronically Signed   By: Ted Mcalpine M.D.   On: 04/26/2018 16:21    Procedures Procedures (including critical care time)  Medications Ordered in ED Medications  potassium chloride SA (K-DUR,KLOR-CON) CR tablet 40 mEq (40 mEq Oral Given 04/27/18 1406)     Initial Impression / Assessment and Plan / ED Course  I have reviewed the triage vital signs and the nursing notes.  Pertinent labs & imaging results that were available during my care of the patient were reviewed by me and considered in my medical decision making (see chart for  details).  Clinical Course as of Apr 27 2344  Tue Apr 26, 2018  1659 Labs reviewed.  Patient is anemic but this is not significantly changed from her baseline   [JK]  1659 Troponin is slightly elevated but previous values were also elevated.  This is not significantly changed   [JK]    Clinical Course User Index [JK] Linwood Dibbles, MD    Patient presented to the emergency room for evaluation after a syncopal episode.  Patient was initially listless and lethargic but that slowly improved in the ED.  No focal neuro deficits.  No clear seizure history.  Sx may have been related to hypotension associated with her dialysis. Plan on admission for cardiac monitoring.  Final Clinical Impressions(s) / ED Diagnoses   Final diagnoses:  Syncope and collapse      Linwood Dibbles, MD 04/27/18 2348

## 2018-04-26 NOTE — ED Notes (Signed)
Lab reports running A1C to previous blood draw.

## 2018-04-26 NOTE — Progress Notes (Signed)
Instructed pt on procedure. VU. Dialysis needles removed, pressure held 20 min with pressure drsg applied. Instructed pt to notify nurse if any bleeding noted. VU. Tomasita Morrow, RN VAST

## 2018-04-26 NOTE — Telephone Encounter (Signed)
LMOM; patient to call back and schedule follow up with coumadin clinic

## 2018-04-26 NOTE — H&P (Signed)
History and Physical    Jackie [name] ZOX:[id number] DOB: [birth year] DOA: 04/26/2018  PCP: Mirna Mires, MD Consultants:  [facility] - Nephrology Patient coming from:  Home - lives alone; NOK: Children, [phone], [phone]  Chief Complaint: Syncope  HPI: Jackie [name] is a 83 y.o. female with medical history significant of HTN; HLD; DM; PVD s/p amputation; afib on Coumadin; and ESRD on HD presenting with syncope. She went to HD today and she started cramping and asked to be taken off.  The next thing she knew, the paramedics were there and she had passed out.  She was here a month ago for weakness, "the same thing."  She felt ok before HD.  Now she feels a little bit better but is hungry.  Slight cough.  She isn't taking much fluid due to dietary restrictions.   ED Course:  Obs for syncope during HD.  At HD today, her BP dropped and she became unresponsive.  When EMS arrived, her BP was improved but she was listless and unresponsive.  Patient denies CP/SOB.  She was hard to understand with slowed speech on arrival and this has improved.  BP is ok now.  ER work-up is ok other than troponin 0.09, previously 0.17.  CT negative.  Review of Systems: As per HPI; otherwise review of systems reviewed and negative.   Ambulatory Status:  Uses a wheelchair  Past Medical History:  Diagnosis Date  . Anemia   . Arthritis   . Diabetes mellitus   . End-stage renal disease on hemodialysis (HCC)    dialysis T/Th/Sa  . Hx of BKA, left (HCC)   . Hyperlipidemia   . Hypertension   . Pneumonia     Past Surgical History:  Procedure Laterality Date  . ABDOMINAL AORTOGRAM W/LOWER EXTREMITY N/A 12/01/2017   Procedure: ABDOMINAL AORTOGRAM W/LOWER EXTREMITY;  Surgeon: Nada Libman, MD;  Location: [facility] INVASIVE CV LAB;  Service: Cardiovascular;  Laterality: N/A;  bilateral  . AMPUTATION Left 04/24/2015   Procedure: LEFT BELOW KNEE AMPUTATION;  Surgeon: Nadara Mustard, MD;  Location: [facility] OR;  Service:  Orthopedics;  Laterality: Left;  . ARTERIOVENOUS GRAFT PLACEMENT Left    non function  . AV FISTULA PLACEMENT Right 01/30/2013   Procedure: ARTERIOVENOUS (AV) FISTULA CREATION;  Surgeon: Fransisco Hertz, MD;  Location: [facility] OR;  Service: Vascular;  Laterality: Right;  . AV FISTULA PLACEMENT Left 10/04/2017   Procedure: INSERTION OF ARTERIOVENOUS (AV) GORE-TEX GRAFT LEFT UPPER ARM;  Surgeon: Chuck Hint, MD;  Location: [facility] OR;  Service: Vascular;  Laterality: Left;  . CESAREAN SECTION    . EYE SURGERY Bilateral    catarct  . FISTULA SUPERFICIALIZATION Right 04/24/2013   Procedure: FISTULA SUPERFICIALIZATION;  Surgeon: Larina Earthly, MD;  Location: [facility] OR;  Service: Vascular;  Laterality: Right;  . FISTULOGRAM Right 07/26/2013   Procedure: FISTULOGRAM;  Surgeon: Larina Earthly, MD;  Location: [facility] CATH LAB;  Service: Cardiovascular;  Laterality: Right;  . INSERTION OF DIALYSIS CATHETER Right 04/24/2013   Procedure: INSERTION OF DIALYSIS CATHETER;  Surgeon: Larina Earthly, MD;  Location: [facility] OR;  Service: Vascular;  Laterality: Right;  . PERIPHERAL VASCULAR ATHERECTOMY  12/01/2017   Procedure: PERIPHERAL VASCULAR ATHERECTOMY;  Surgeon: Nada Libman, MD;  Location: [facility] INVAS[facility] CV LAB;  Service: Cardiovascular;;  Rt. SFA  . PERIPHERAL VASCULAR CATHETERIZATION N/A 12/05/2014   Procedure: Abdominal Aortogram;  Surgeon: Fransisco Hertz, MD;  Location: [facility] INVASIVE CV LAB;  Service: Cardiovascular;  Laterality:  N/A;  . PERIPHERAL VASCULAR CATHETERIZATION N/A 03/19/2015   Procedure: Lower Extremity Angiography;  Surgeon: Nada Libman, MD;  Location: [facility] INVASIVE CV LAB;  Service: Cardiovascular;  Laterality: N/A;  . PERIPHERAL VASCULAR CATHETERIZATION  03/19/2015   Procedure: Peripheral Vascular Intervention;  Surgeon: Nada Libman, MD;  Location: [facility] INVASIVE CV LAB;  Service: Cardiovascular;;  left sfa and popliteal stent  . TONSILLECTOMY    . UPPER EXTREMITY VENOGRAPHY N/A 09/17/2017   Procedure: UPPER  EXTREMITY VENOGRAPHY;  Surgeon: Chuck Hint, MD;  Location: [facility] INVASIVE CV LAB;  Service: Cardiovascular;  Laterality: N/A;    Social History   Socioeconomic History  . Marital status: Widowed    Spouse name: Not on file  . Number of children: Not on file  . Years of education: Not on file  . Highest education level: Not on file  Occupational History  . Occupation: retired  Engineer, production  . Financial resource strain: Not on file  . Food insecurity:    Worry: Not on file    Inability: Not on file  . Transportation needs:    Medical: Not on file    Non-medical: Not on file  Tobacco Use  . Smoking status: Never Smoker  . Smokeless tobacco: Never Used  Substance and Sexual Activity  . Alcohol use: No  . Drug use: No  . Sexual activity: Not on file  Lifestyle  . Physical activity:    Days per week: Not on file    Minutes per session: Not on file  . Stress: Not on file  Relationships  . Social connections:    Talks on phone: Not on file    Gets together: Not on file    Attends religious service: Not on file    Active member of club or organization: Not on file    Attends meetings of clubs or organizations: Not on file    Relationship status: Not on file  . Intimate partner violence:    Fear of current or ex partner: Not on file    Emotionally abused: Not on file    Physically abused: Not on file    Forced sexual activity: Not on file  Other Topics Concern  . Not on file  Social History Narrative  . Not on file    No Known Allergies  Family History  Problem Relation Age of Onset  . Diabetes Mother   . Heart disease Mother   . Hypertension Mother     Prior to Admission medications   Medication Sig Start Date End Date Taking? Authorizing Provider  albuterol (PROVENTIL HFA;VENTOLIN HFA) 108 (90 BASE) MCG/ACT inhaler Inhale 1-2 puffs into the lungs every 6 (six) hours as needed for wheezing. 12/27/12   Palumbo, April, MD  cefTAZidime 2 g in dextrose 5 %  50 mL Inject 2 g into the vein Every Tuesday,Thursday,and Saturday with dialysis. 12/08/14   Osvaldo Shipper, MD  cinacalcet (SENSIPAR) 30 MG tablet Take 30 mg by mouth daily.    [provider]  clopidogrel (PLAVIX) 75 MG tablet TAKE ONE TABLET   BY MOUTH   DAILY Patient taking differently: Take 75 mg by mouth daily.  02/15/17   Chuck Hint, MD  insulin detemir (LEVEMIR) 100 UNIT/ML injection Inject 0.1 mLs (10 Units total) into the skin at bedtime. Patient taking differently: Inject 15 Units into the skin at bedtime.  12/07/14   Osvaldo Shipper, MD  lidocaine-prilocaine (EMLA) cream Apply 1 application topically daily as needed (prior  to dialysis).  11/24/17   [provider]  metoprolol tartrate (LOPRESSOR) 50 MG tablet Take 50 mg by mouth 2 (two) times daily. 08/19/17   [provider]  oxyCODONE (ROXICODONE) 5 MG immediate release tablet Take 1 tablet (5 mg total) by mouth every 4 (four) hours as needed for severe pain. Patient not taking: Reported on 04/18/2018 10/04/17   Chuck Hint, MD  oxyCODONE-acetaminophen (ROXICET) 5-325 MG tablet Take 1 tablet by mouth every 4 (four) hours as needed for severe pain. Patient not taking: Reported on 04/18/2018 04/25/15   Nadara Mustard, MD  promethazine (PHENERGAN) 12.5 MG tablet Take 12.5 mg by mouth daily as needed for nausea or vomiting.  07/22/17   [provider]  saccharomyces boulardii (FLORASTOR) 250 MG capsule Take 1 capsule (250 mg total) by mouth 2 (two) times daily. 12/07/14   Osvaldo Shipper, MD  sevelamer carbonate (RENVELA) 2.4 g PACK Take 2.4 g by mouth 3 (three) times daily with meals.  09/06/17   [provider]  warfarin (COUMADIN) 5 MG tablet TAKE 1 to 1.5 TABLETS BY MOUTH DAILY AS DIRECTED BY THE COUMADIN CLINIC. Patient taking differently: Take 5-7.5 mg by mouth daily.  04/07/18   Chrystie Nose, MD    Physical Exam: Vitals:   04/26/18 1630 04/26/18 1700 04/26/18 1730  04/26/18 1800  BP: (!) 143/91 121/83 (!) 127/93 135/80  Pulse: (!) 103 (!) 109 (!) 113 (!) 110  Resp: 14 12 15 17   Temp:      TempSrc:      SpO2: 100% 98% 98% 98%  Weight:      Height:         General:  Appears calm and comfortable and is NAD Eyes:  PERRL, EOMI, normal lids, iris ENT:  grossly normal hearing, lips & tongue, mmm; edentulous on top gum Neck:  no LAD, masses or thyromegaly; no carotid bruits Cardiovascular:  Afib with mild tachycardia (110 range), no m/r/g. No LE edema.  Respiratory:   CTA bilaterally with no wheezes/rales/rhonchi.  Normal respiratory effort. Abdomen:  soft, NT, ND, NABS Skin: RLE healing superficial wounds on dorsum of the foot (patient/son report that this is markedly better, has been followed by [facility]) Musculoskeletal:  S/p L BKA Psychiatric:  grossly normal mood and affect, speech fluent and appropriate, AOx3 Neurologic:  CN 2-12 grossly intact, moves all extremities in coordinated fashion, sensation intact    Radiological Exams on Admission: Ct Head Wo Contrast  Result Date: 04/26/2018 CLINICAL DATA:  Syncopal episode during dialysis. EXAM: CT HEAD WITHOUT CONTRAST TECHNIQUE: Contiguous axial images were obtained from the base of the skull through the vertex without intravenous contrast. COMPARISON:  05/21/2010 FINDINGS: Brain: No evidence of acute infarction, hemorrhage, hydrocephalus, extra-axial collection or mass lesion/mass effect. Moderate brain parenchymal volume loss and deep white matter microangiopathy. Vascular: Calcific atherosclerotic disease of the intra cavernous carotid arteries. Skull: Normal. Negative for fracture or focal lesion. Sinuses/Orbits: No acute finding. Other: None. IMPRESSION: No acute intracranial abnormality. Atrophy, chronic microvascular disease. Electronically Signed   By: Ted Mcalpine M.D.   On: 04/26/2018 16:21    EKG: Independently reviewed.  Afib with rate 113; nonspecific ST changes with no  evidence of acute ischemia   Labs on Admission: I have personally reviewed the available labs and imaging studies at the time of the admission.  Pertinent labs:   K+ 3.0 BUN 14/Creatinine 4.30/GFR 9 - stable Anion gap 16 Albumin 2.8 Troponin 0.09 Hgb 9.8; prior 10.3  on 9/11 Platelets 86; prior 102 on 9/11 INR 3.69  Assessment/Plan Principal Problem:   Syncope and collapse Active Problems:   ESRD (end stage renal disease) on dialysis (HCC)   Diabetes mellitus (HCC)   Anemia   Atrial fibrillation (HCC)   Long term (current) use of anticoagulants [Z79.01]   Syncope -Etiology appears to be related to hypotension in the setting of HD -Her BP has normalized and her symptoms - while slow - have resolved -Will monitor on telemetry -Neuro checks  -Assuming ongoing clinical stability, patient is anticipated to be appropriate for d/c in the AM  ESRD on HD -Patient on chronic TTS HD -Nephrology prn order set utilized -She does not appear to be volume overloaded or otherwise in need of acute HD -She appears to be appropriate to resume HD on Thursday -It may be reasonable to increase her EDW -Continue Sensipar, Renvela, Phoslo  Afib on Coumadin -Generally rate controlled without medication -Continue Coumadin, INR is appropriate at goal 2-3  DM -Will check A1c -Continue Levemir -Cover with sensitive-scale SSI   DVT prophylaxis: Coumadin Code Status:  Full - confirmed with patient/family Family Communication: Son present throughout evaluation  Disposition Plan:  Home once clinically improved Consults called: None  Admission status: It is my clinical opinion that referral for OBSERVATION is reasonable and necessary in this patient based on the above information provided. The aforementioned taken together are felt to place the patient at high risk for further clinical deterioration. However it is anticipated that the patient may be medically stable for discharge from the  hospital within 24 to 48 hours.    Jonah Blue MD [facility]  If note is complete, please contact covering daytime or nighttime physician. [email or web address] Password [email or web address]  04/26/2018, 6:06 PM

## 2018-04-27 DIAGNOSIS — Z992 Dependence on renal dialysis: Secondary | ICD-10-CM | POA: Diagnosis not present

## 2018-04-27 DIAGNOSIS — Z794 Long term (current) use of insulin: Secondary | ICD-10-CM | POA: Diagnosis not present

## 2018-04-27 DIAGNOSIS — R55 Syncope and collapse: Secondary | ICD-10-CM | POA: Diagnosis not present

## 2018-04-27 DIAGNOSIS — N186 End stage renal disease: Secondary | ICD-10-CM

## 2018-04-27 DIAGNOSIS — I959 Hypotension, unspecified: Secondary | ICD-10-CM | POA: Diagnosis not present

## 2018-04-27 DIAGNOSIS — N184 Chronic kidney disease, stage 4 (severe): Secondary | ICD-10-CM

## 2018-04-27 DIAGNOSIS — E1122 Type 2 diabetes mellitus with diabetic chronic kidney disease: Secondary | ICD-10-CM

## 2018-04-27 LAB — GLUCOSE, CAPILLARY
GLUCOSE-CAPILLARY: 74 mg/dL (ref 70–99)
Glucose-Capillary: 88 mg/dL (ref 70–99)

## 2018-04-27 LAB — CBC
HCT: 31.6 % — ABNORMAL LOW (ref 36.0–46.0)
HEMOGLOBIN: 9.4 g/dL — AB (ref 12.0–15.0)
MCH: 27.7 pg (ref 26.0–34.0)
MCHC: 29.7 g/dL — AB (ref 30.0–36.0)
MCV: 93.2 fL (ref 80.0–100.0)
NRBC: 0 % (ref 0.0–0.2)
Platelets: 132 10*3/uL — ABNORMAL LOW (ref 150–400)
RBC: 3.39 MIL/uL — ABNORMAL LOW (ref 3.87–5.11)
RDW: 16.2 % — AB (ref 11.5–15.5)
WBC: 5.4 10*3/uL (ref 4.0–10.5)

## 2018-04-27 LAB — BASIC METABOLIC PANEL
Anion gap: 13 (ref 5–15)
BUN: 19 mg/dL (ref 8–23)
CO2: 26 mmol/L (ref 22–32)
Calcium: 8.8 mg/dL — ABNORMAL LOW (ref 8.9–10.3)
Chloride: 98 mmol/L (ref 98–111)
Creatinine, Ser: 5.54 mg/dL — ABNORMAL HIGH (ref 0.44–1.00)
GFR calc Af Amer: 8 mL/min — ABNORMAL LOW (ref >60)
GFR, EST NON AFRICAN AMERICAN: 6 mL/min — AB (ref >60)
Glucose, Bld: 35 mg/dL — CL (ref 70–99)
POTASSIUM: 3 mmol/L — AB (ref 3.5–5.1)
Sodium: 137 mmol/L (ref 135–145)

## 2018-04-27 LAB — PROTIME-INR
INR: 4.08
PROTHROMBIN TIME: 39.3 s — AB (ref 11.4–15.2)

## 2018-04-27 MED ORDER — INSULIN DETEMIR 100 UNIT/ML ~~LOC~~ SOLN: 8.0000 [IU] | Freq: Every day | SUBCUTANEOUS | 11 refills | Status: AC

## 2018-04-27 MED ORDER — WARFARIN SODIUM 5 MG PO TABS: ORAL_TABLET | ORAL | 0 refills | Status: AC

## 2018-04-27 MED ORDER — POTASSIUM CHLORIDE CRYS ER 20 MEQ PO TBCR
40.0000 meq | EXTENDED_RELEASE_TABLET | Freq: Once | ORAL | Status: AC
  Administered 2018-04-27: 40 meq via ORAL
  Filled 2018-04-27: qty 2

## 2018-04-27 NOTE — Progress Notes (Signed)
This nurse called for a blood sugar of 35. Pt awake and responding, a little sleepy. Given juice and recheck blood is 74.

## 2018-04-27 NOTE — Progress Notes (Signed)
Nsg Discharge Note  Admit Date:  04/26/2018 Discharge date: 04/27/2018   Barbette Hair to be D/C'd Home with home health per MD order.  AVS completed.  Copy for chart, and copy for patient signed, and dated. Patient/caregiver able to verbalize understanding.  Discharge Medication: Allergies as of 04/27/2018   No Known Allergies     Medication List    STOP taking these medications   cefTAZidime 2 g in dextrose 5 % 50 mL     TAKE these medications   albuterol 108 (90 Base) MCG/ACT inhaler Commonly known as:  PROVENTIL HFA;VENTOLIN HFA Inhale 1-2 puffs into the lungs every 6 (six) hours as needed for wheezing.   b complex-vitamin c-folic acid 0.8 MG Tabs tablet Take 1 tablet by mouth at bedtime.   calcium acetate 667 MG capsule Commonly known as:  PHOSLO Take 2,001 mg by mouth 3 (three) times daily with meals.   cinacalcet 30 MG tablet Commonly known as:  SENSIPAR Take 30 mg by mouth daily.   insulin detemir 100 UNIT/ML injection Commonly known as:  LEVEMIR Inject 0.08 mLs (8 Units total) into the skin at bedtime. What changed:  how much to take   lidocaine-prilocaine cream Commonly known as:  EMLA Apply 1 application topically daily as needed (prior to dialysis).   promethazine 12.5 MG tablet Commonly known as:  PHENERGAN Take 12.5 mg by mouth daily as needed for nausea or vomiting.   sevelamer carbonate 2.4 g Pack Commonly known as:  RENVELA Take 2.4 g by mouth 3 (three) times daily with meals.   traMADol 50 MG tablet Commonly known as:  ULTRAM Take 50 mg by mouth every 6 (six) hours as needed for moderate pain or severe pain.   warfarin 5 MG tablet Commonly known as:  COUMADIN Take as directed. If you are unsure how to take this medication, talk to your nurse or doctor. Original instructions:  TAKE 1 to 1.5 TABLETS BY MOUTH DAILY AS DIRECTED BY THE COUMADIN CLINIC. Start taking on:  04/29/2018 What changed:  These instructions start on 04/29/2018. If you  are unsure what to do until then, ask your doctor or other care provider.       Discharge Assessment: Vitals:   04/27/18 0540 04/27/18 1544  BP: (!) 134/93 (!) 125/51  Pulse: (!) 118 97  Resp: 17   Temp: (!) 97.4 F (36.3 C) 98.8 F (37.1 C)  SpO2: 98% 100%   Skin clean, dry and intact without evidence of skin break down, no evidence of skin tears noted. IV catheter discontinued intact. Site without signs and symptoms of complications - no redness or edema noted at insertion site, patient denies c/o pain - only slight tenderness at site.  Dressing with slight pressure applied.  D/c Instructions-Education: Discharge instructions given to patient/family with verbalized understanding. D/c education completed with patient/family including follow up instructions, medication list, d/c activities limitations if indicated, with other d/c instructions as indicated by MD - patient able to verbalize understanding, all questions fully answered. Patient instructed to return to ED, call 911, or call MD for any changes in condition.  Patient escorted via WC, and D/C home via private auto.  Colbert Ewing, RN 04/27/2018 4:52 PM

## 2018-04-27 NOTE — Progress Notes (Signed)
   04/27/18 1000  Clinical Encounter Type  Visited With Patient and family together  Visit Type Initial  Referral From Other (Comment)  Spiritual Encounters  Spiritual Needs Emotional;Prayer  Stress Factors  Patient Stress Factors Health changes   While doing rounds PT and family member greeted me outside door. They invited me in. PT was wanting to talk. PT was concerned about be released soon (today) so that she can go to her treatment appointment. I offered spiritual care with word of encouragement, ministry of presence and prayer. Chaplain available as needed.   Chaplain Orest Dikes [phone]

## 2018-04-27 NOTE — Progress Notes (Signed)
Inpatient Diabetes Program Recommendations  AACE/ADA: New Consensus Statement on Inpatient Glycemic Control (2015)  Target Ranges:  Prepandial:   less than 140 mg/dL      Peak postprandial:   less than 180 mg/dL (1-2 hours)      Critically ill patients:  140 - 180 mg/dL   Spoke with patient and son at bedside in regards to A1c level, home regimen, and hypoglycemia at home.  Patient does not check her glucose but takes Levemir 15 units consistently at night.  Son reports trying to get home health in to check glucose in addition to HD checking glucose on those days. Spoke to patient and son about checking glucose at least 2 times a day (fasting and alternating second check) and to get a glucose meter over the counter at [facility].  Discussed with patient and son about the A1c level on patient and having hypoglycemia at home according to the A1c level. A1c target for patient would idealy be between 6.5-7.5%.  Discussed plan of care with Dr. Benjamine Mola. Dr. Benjamine Mola to reduce home dose of Levemir to 8 units qhs. Explained to patient and son.  Son reports someone will check her glucose between 9-10 every morning on HD days and at 6pm when someone checks in with her in the evenings.  Thanks,  Christena Deem RN, MSN, BC-ADM Inpatient Diabetes Coordinator Team Pager [phone] (8a-5p)

## 2018-04-27 NOTE — Care Management Obs Status (Signed)
MEDICARE OBSERVATION STATUS NOTIFICATION   Patient Details  Name: Jackie [name] MRN: [id number] Date of Birth: 1935/04/24   Medicare Observation Status Notification Given:  Yes    Epifanio Lesches, RN 04/27/2018, 7:43 AM

## 2018-04-27 NOTE — Progress Notes (Signed)
ANTICOAGULATION CONSULT NOTE  Pharmacy Consult for warfarin Indication: atrial fibrillation  No Known Allergies  Patient Measurements: Height: 5\' 8"  (172.7 cm) Weight: 134 lb 7.7 oz (61 kg) IBW/kg (Calculated) : 63.9  Vital Signs: Temp: 97.4 F (36.3 C) (10/09 0540) Temp Source: Oral (10/09 0540) BP: 134/93 (10/09 0540) Pulse Rate: 118 (10/09 0540)  Labs: Recent Labs    04/26/18 1537 04/27/18 0516  HGB 9.8* 9.4*  HCT 32.1* 31.6*  PLT 86* 132*  APTT 41*  --   LABPROT 36.3* 39.3*  INR 3.69 4.08*  CREATININE 4.30* 5.54*    Estimated Creatinine Clearance: 7.7 mL/min (A) (by C-G formula based on SCr of 5.54 mg/dL (H)).   Assessment: Pt is 81 YOF presenting with syncopal episode during HD today. PMH is significant for HTN, HLD, DM, PVD s/p amputation, Afib on Coumadin, and ESRD on HD TTS. PTA warfarin for Afib; INR goal 2-3. Pt states takes 7.5 mg everyday. Last anti-coag note states pt to take 7.5 mg S/M/T/Th/F/S and 5 mg on Wednesday.  INR today supratherapeutic  Goal of Therapy:  INR 2-3 Monitor platelets by anticoagulation protocol: Yes   Plan:  Hold warfarin Obtain daily INR  Thank you Okey Regal, PharmD [phone]  Elwin Sleight 04/27/2018,11:11 AM

## 2018-04-27 NOTE — Care Management Note (Signed)
Case Management Note  Patient Details  Name: Jackie [name] MRN: [id number] Date of Birth: 12/27/1934  Subjective/Objective:  Presented with syncopal episode, hx of HTN, HLD, DM, PVD s/p amputation, Afib on Coumadin, and ESRD on HD TTS.Resides alone. Pt with 4 supportive sons. Independent with ADL's. OZD:GUYQIH.   Jannet Askew (Son) Roma ([phone]     PCP: Mirna Mires  Action/Plan: Transition to home with home health services to follow. Freida Busman (son) to provide transportation to home.  Expected Discharge Date:  04/27/18               Expected Discharge Plan:  Home w Home Health Services  In-House Referral:     Discharge planning Services  CM Consult  Post Acute Care Choice:    Choice offered to:  Patient  DME Arranged:   N/A DME Agency:   N/A  HH Arranged:  RN, PT HH Agency:  [facility]  Status of Service:  completed  If discussed at Long Length of Stay Meetings, dates discussed:    Additional Comments:  Epifanio Lesches, RN 04/27/2018, 1:26 PM

## 2018-04-27 NOTE — Discharge Summary (Signed)
Physician Discharge Summary  PRUDIE GUTHRIDGE ZOX:[id number] DOB: Oct 01, 1934 DOA: 04/26/2018  PCP: Mirna Mires, MD  Admit date: 04/26/2018 Discharge date: 04/27/2018  Admitted From: home Discharge disposition: home   Recommendations for Outpatient Follow-Up:   1. Patient to hold coumadin until Friday where home health is to do INR check and call cardiology for medication adjustments 2. Patient is to bring log of blood sugars into 3. renal to make adjustments to HD 4. Home health for INR and blood sugars monitoring   Discharge Diagnosis:   Principal Problem:   Syncope and collapse Active Problems:   ESRD (end stage renal disease) on dialysis (HCC)   Diabetes mellitus (HCC)   Anemia   Atrial fibrillation (HCC)   Long term (current) use of anticoagulants [Z79.01]    Discharge Condition: Improved.  Diet recommendation: renal/carb mod  Wound care: None.  Code status: Full.   History of Present Illness:   Jackie [name] is a 83 y.o. female with medical history significant of HTN; HLD; DM; PVD s/p amputation; afib on Coumadin; and ESRD on HD presenting with syncope. She went to HD today and she started cramping and asked to be taken off.  The next thing she knew, the paramedics were there and she had passed out.  She was here a month ago for weakness, "the same thing."  She felt ok before HD.  Now she feels a little bit better but is hungry.  Slight cough.  She isn't taking much fluid due to dietary restrictions.   ED Course:  Obs for syncope during HD.  At HD today, her BP dropped and she became unresponsive.  When EMS arrived, her BP was improved but she was listless and unresponsive.  Patient denies CP/SOB.  She was hard to understand with slowed speech on arrival and this has improved.  BP is ok now.  ER work-up is ok other than troponin 0.09, previously 0.17.  CT negative.   Hospital Course by Problem:   Syncope -Etiology appears to be related to  hypotension in the setting of HD -Her BP has normalized and her symptoms  have resolved -defer to renal to adjust HD  ESRD on HD -Patient on chronic TTS HD -She does not appear to be volume overloaded or otherwise in need of acute HD -She appears to be appropriate to resume HD on Thursday -It may be reasonable to increase her EDW-- defer to renal -Continue Sensipar, Renvela, Phoslo  Afib on Coumadin -Generally rate controlled without medication -hold coumadin and have INR check on Friday via home health  DM - A1c <6 -Continue Levemir but at a lower dose -encouraged patient and family to check blood sugars    Medical Consultants:      Discharge Exam:   Vitals:   04/27/18 0540 04/27/18 1544  BP: (!) 134/93 (!) 125/51  Pulse: (!) 118 97  Resp: 17   Temp: (!) 97.4 F (36.3 C) 98.8 F (37.1 C)  SpO2: 98% 100%   Vitals:   04/26/18 2030 04/26/18 2104 04/27/18 0540 04/27/18 1544  BP: 130/71 138/63 (!) 134/93 (!) 125/51  Pulse: (!) 107 (!) 101 (!) 118 97  Resp: (!) 21 17 17    Temp:  98.3 F (36.8 C) (!) 97.4 F (36.3 C) 98.8 F (37.1 C)  TempSrc:  Oral Oral Oral  SpO2: 100% 96% 98% 100%  Weight:      Height:        General exam: Appears calm  and comfortable.    The results of significant diagnostics from this hospitalization (including imaging, microbiology, ancillary and laboratory) are listed below for reference.     Procedures and Diagnostic Studies:   Ct Head Wo Contrast  Result Date: 04/26/2018 CLINICAL DATA:  Syncopal episode during dialysis. EXAM: CT HEAD WITHOUT CONTRAST TECHNIQUE: Contiguous axial images were obtained from the base of the skull through the vertex without intravenous contrast. COMPARISON:  05/21/2010 FINDINGS: Brain: No evidence of acute infarction, hemorrhage, hydrocephalus, extra-axial collection or mass lesion/mass effect. Moderate brain parenchymal volume loss and deep white matter microangiopathy. Vascular: Calcific  atherosclerotic disease of the intra cavernous carotid arteries. Skull: Normal. Negative for fracture or focal lesion. Sinuses/Orbits: No acute finding. Other: None. IMPRESSION: No acute intracranial abnormality. Atrophy, chronic microvascular disease. Electronically Signed   By: Ted Mcalpine M.D.   On: 04/26/2018 16:21     Labs:   Basic Metabolic Panel: Recent Labs  Lab 04/26/18 1537 04/27/18 0516  NA 137 137  K 3.0* 3.0*  CL 96* 98  CO2 25 26  GLUCOSE 97 35*  BUN 14 19  CREATININE 4.30* 5.54*  CALCIUM 8.4* 8.8*   GFR Estimated Creatinine Clearance: 7.7 mL/min (A) (by C-G formula based on SCr of 5.54 mg/dL (H)). Liver Function Tests: Recent Labs  Lab 04/26/18 1537  AST 33  ALT 14  ALKPHOS 66  BILITOT 1.0  PROT 6.8  ALBUMIN 2.8*   No results for input(s): LIPASE, AMYLASE in the last 168 hours. No results for input(s): AMMONIA in the last 168 hours. Coagulation profile Recent Labs  Lab 04/26/18 1537 04/27/18 0516  INR 3.69 4.08*    CBC: Recent Labs  Lab 04/26/18 1537 04/27/18 0516  WBC 5.6 5.4  NEUTROABS 4.3  --   HGB 9.8* 9.4*  HCT 32.1* 31.6*  MCV 93.3 93.2  PLT 86* 132*   Cardiac Enzymes: No results for input(s): CKTOTAL, CKMB, CKMBINDEX, TROPONINI in the last 168 hours. BNP: Invalid input(s): POCBNP CBG: Recent Labs  Lab 04/26/18 1536 04/26/18 2107 04/27/18 0744 04/27/18 1223  GLUCAP 94 121* 74 88   D-Dimer No results for input(s): DDIMER in the last 72 hours. Hgb A1c Recent Labs    04/26/18 1748  HGBA1C 5.4   Lipid Profile No results for input(s): CHOL, HDL, LDLCALC, TRIG, CHOLHDL, LDLDIRECT in the last 72 hours. Thyroid function studies No results for input(s): TSH, T4TOTAL, T3FREE, THYROIDAB in the last 72 hours.  Invalid input(s): FREET3 Anemia work up No results for input(s): VITAMINB12, FOLATE, FERRITIN, TIBC, IRON, RETICCTPCT in the last 72 hours. Microbiology No results found for this or any previous visit (from  the past 240 hour(s)).   Discharge Instructions:   Discharge Instructions    Discharge instructions   Complete by:  As directed    Renal/carb mod diet Bring log of blood sugars to PCP for medication adjustment INR check on Friday-- hold coumadin until Friday and home health RN will tell you how to take going forward   Increase activity slowly   Complete by:  As directed      Allergies as of 04/27/2018   No Known Allergies     Medication List    STOP taking these medications   cefTAZidime 2 g in dextrose 5 % 50 mL     TAKE these medications   albuterol 108 (90 Base) MCG/ACT inhaler Commonly known as:  PROVENTIL HFA;VENTOLIN HFA Inhale 1-2 puffs into the lungs every 6 (six) hours as needed for wheezing.  b complex-vitamin c-folic acid 0.8 MG Tabs tablet Take 1 tablet by mouth at bedtime.   calcium acetate 667 MG capsule Commonly known as:  PHOSLO Take 2,001 mg by mouth 3 (three) times daily with meals.   cinacalcet 30 MG tablet Commonly known as:  SENSIPAR Take 30 mg by mouth daily.   insulin detemir 100 UNIT/ML injection Commonly known as:  LEVEMIR Inject 0.08 mLs (8 Units total) into the skin at bedtime. What changed:  how much to take   lidocaine-prilocaine cream Commonly known as:  EMLA Apply 1 application topically daily as needed (prior to dialysis).   promethazine 12.5 MG tablet Commonly known as:  PHENERGAN Take 12.5 mg by mouth daily as needed for nausea or vomiting.   sevelamer carbonate 2.4 g Pack Commonly known as:  RENVELA Take 2.4 g by mouth 3 (three) times daily with meals.   traMADol 50 MG tablet Commonly known as:  ULTRAM Take 50 mg by mouth every 6 (six) hours as needed for moderate pain or severe pain.   warfarin 5 MG tablet Commonly known as:  COUMADIN Take as directed. If you are unsure how to take this medication, talk to your nurse or doctor. Original instructions:  TAKE 1 to 1.5 TABLETS BY MOUTH DAILY AS DIRECTED BY THE COUMADIN  CLINIC. Start taking on:  04/29/2018 What changed:  These instructions start on 04/29/2018. If you are unsure what to do until then, ask your doctor or other care provider.      Follow-up Information    [facility] Follow up.   Specialty:  Home Health Services Why:  home health services arranged Contact information: [address] [place] [address] [phone]        Mirna Mires, MD Follow up in 1 week(s).   Specialty:  Family Medicine Contact information: [address] [place] [address] [phone]        Chrystie Nose, MD Follow up.   Specialty:  Cardiology Why:  INR check on Friday Contact information: [address] [place] [address] [phone]            Time coordinating discharge: 25 min  Signed:  Joseph Art  [facility] 04/27/2018, 4:28 PM

## 2018-04-28 DIAGNOSIS — N2581 Secondary hyperparathyroidism of renal origin: Secondary | ICD-10-CM | POA: Diagnosis not present

## 2018-04-28 DIAGNOSIS — E1129 Type 2 diabetes mellitus with other diabetic kidney complication: Secondary | ICD-10-CM | POA: Diagnosis not present

## 2018-04-28 DIAGNOSIS — D509 Iron deficiency anemia, unspecified: Secondary | ICD-10-CM | POA: Diagnosis not present

## 2018-04-28 DIAGNOSIS — D631 Anemia in chronic kidney disease: Secondary | ICD-10-CM | POA: Diagnosis not present

## 2018-04-28 DIAGNOSIS — N186 End stage renal disease: Secondary | ICD-10-CM | POA: Diagnosis not present

## 2018-04-28 DIAGNOSIS — Z23 Encounter for immunization: Secondary | ICD-10-CM | POA: Diagnosis not present

## 2018-04-28 NOTE — Telephone Encounter (Signed)
HH resume services. Next INR tomorrow 04/29/18.

## 2018-04-29 ENCOUNTER — Ambulatory Visit (INDEPENDENT_AMBULATORY_CARE_PROVIDER_SITE_OTHER): Payer: Medicare Other | Admitting: Pharmacist

## 2018-04-29 DIAGNOSIS — Z7901 Long term (current) use of anticoagulants: Secondary | ICD-10-CM

## 2018-04-29 DIAGNOSIS — N186 End stage renal disease: Secondary | ICD-10-CM | POA: Diagnosis not present

## 2018-04-29 DIAGNOSIS — I953 Hypotension of hemodialysis: Secondary | ICD-10-CM | POA: Diagnosis not present

## 2018-04-29 DIAGNOSIS — E1122 Type 2 diabetes mellitus with diabetic chronic kidney disease: Secondary | ICD-10-CM | POA: Diagnosis not present

## 2018-04-29 DIAGNOSIS — Z992 Dependence on renal dialysis: Secondary | ICD-10-CM | POA: Diagnosis not present

## 2018-04-29 DIAGNOSIS — I120 Hypertensive chronic kidney disease with stage 5 chronic kidney disease or end stage renal disease: Secondary | ICD-10-CM | POA: Diagnosis not present

## 2018-04-29 DIAGNOSIS — E1151 Type 2 diabetes mellitus with diabetic peripheral angiopathy without gangrene: Secondary | ICD-10-CM | POA: Diagnosis not present

## 2018-04-29 DIAGNOSIS — I4891 Unspecified atrial fibrillation: Secondary | ICD-10-CM

## 2018-04-29 LAB — POCT INR: INR: 4.6 — AB (ref 2.0–3.0)

## 2018-04-30 DIAGNOSIS — D631 Anemia in chronic kidney disease: Secondary | ICD-10-CM | POA: Diagnosis not present

## 2018-04-30 DIAGNOSIS — Z23 Encounter for immunization: Secondary | ICD-10-CM | POA: Diagnosis not present

## 2018-04-30 DIAGNOSIS — N186 End stage renal disease: Secondary | ICD-10-CM | POA: Diagnosis not present

## 2018-04-30 DIAGNOSIS — N2581 Secondary hyperparathyroidism of renal origin: Secondary | ICD-10-CM | POA: Diagnosis not present

## 2018-04-30 DIAGNOSIS — D509 Iron deficiency anemia, unspecified: Secondary | ICD-10-CM | POA: Diagnosis not present

## 2018-04-30 DIAGNOSIS — E1129 Type 2 diabetes mellitus with other diabetic kidney complication: Secondary | ICD-10-CM | POA: Diagnosis not present

## 2018-05-01 ENCOUNTER — Encounter (HOSPITAL_COMMUNITY): Payer: Self-pay | Admitting: Oncology

## 2018-05-01 ENCOUNTER — Emergency Department (HOSPITAL_COMMUNITY)
Admission: EM | Admit: 2018-05-01 | Discharge: 2018-05-01 | Disposition: A | Discharge status: Home / Self Care | Payer: Medicare Other | Attending: Emergency Medicine | Admitting: Emergency Medicine

## 2018-05-01 ENCOUNTER — Other Ambulatory Visit: Payer: Self-pay

## 2018-05-01 DIAGNOSIS — Z794 Long term (current) use of insulin: Secondary | ICD-10-CM | POA: Insufficient documentation

## 2018-05-01 DIAGNOSIS — E119 Type 2 diabetes mellitus without complications: Secondary | ICD-10-CM | POA: Insufficient documentation

## 2018-05-01 DIAGNOSIS — I953 Hypotension of hemodialysis: Secondary | ICD-10-CM | POA: Diagnosis not present

## 2018-05-01 DIAGNOSIS — R55 Syncope and collapse: Secondary | ICD-10-CM | POA: Diagnosis not present

## 2018-05-01 DIAGNOSIS — I959 Hypotension, unspecified: Secondary | ICD-10-CM | POA: Diagnosis not present

## 2018-05-01 LAB — CBC WITH DIFFERENTIAL/PLATELET
Abs Immature Granulocytes: 0.01 10*3/uL (ref 0.00–0.07)
BASOS ABS: 0 10*3/uL (ref 0.0–0.1)
Basophils Relative: 1 %
EOS PCT: 1 %
Eosinophils Absolute: 0.1 10*3/uL (ref 0.0–0.5)
HCT: 30.7 % — ABNORMAL LOW (ref 36.0–46.0)
HEMOGLOBIN: 9.1 g/dL — AB (ref 12.0–15.0)
Immature Granulocytes: 0 %
LYMPHS PCT: 20 %
Lymphs Abs: 0.9 10*3/uL (ref 0.7–4.0)
MCH: 28 pg (ref 26.0–34.0)
MCHC: 29.6 g/dL — AB (ref 30.0–36.0)
MCV: 94.5 fL (ref 80.0–100.0)
MONO ABS: 0.5 10*3/uL (ref 0.1–1.0)
Monocytes Relative: 11 %
NRBC: 0.5 % — AB (ref 0.0–0.2)
Neutro Abs: 2.8 10*3/uL (ref 1.7–7.7)
Neutrophils Relative %: 67 %
Platelets: 72 10*3/uL — ABNORMAL LOW (ref 150–400)
RBC: 3.25 MIL/uL — AB (ref 3.87–5.11)
RDW: 17.1 % — AB (ref 11.5–15.5)
WBC: 4.3 10*3/uL (ref 4.0–10.5)

## 2018-05-01 LAB — BASIC METABOLIC PANEL
Anion gap: 9 (ref 5–15)
BUN: 9 mg/dL (ref 8–23)
CHLORIDE: 100 mmol/L (ref 98–111)
CO2: 27 mmol/L (ref 22–32)
CREATININE: 3.36 mg/dL — AB (ref 0.44–1.00)
Calcium: 9 mg/dL (ref 8.9–10.3)
GFR calc non Af Amer: 12 mL/min — ABNORMAL LOW (ref >60)
GFR, EST AFRICAN AMERICAN: 14 mL/min — AB (ref >60)
Glucose, Bld: 78 mg/dL (ref 70–99)
Potassium: 3.5 mmol/L (ref 3.5–5.1)
SODIUM: 136 mmol/L (ref 135–145)

## 2018-05-01 LAB — PROTIME-INR
INR: 1.87
PROTHROMBIN TIME: 21.4 s — AB (ref 11.4–15.2)

## 2018-05-01 NOTE — ED Triage Notes (Signed)
Pt bib [facility] from home d/t hypotension.  Pt has dialysis T,TH,S.  Per EMS, pt had to stop them from pulling off fluid each day d/t feeling like she was going to pass out.  Pt's BP sitting for EMS was 96/68, lying BP was 140/60. Pt endorses feeling like she may pass out and believes they are removing too much fluid w/ each dialysis.

## 2018-05-01 NOTE — ED Provider Notes (Signed)
[facility] EMERGENCY DEPARTMENT Provider Note   CSN: [id number] Arrival date & time: 05/01/18  0431     History   Chief Complaint Chief Complaint  Patient presents with  . Hypotension    HPI Jackie [name] is a 83 y.o. female.  HPI  Is an 83 year old female with a history of diabetes, end-stage renal disease on dialysis Tuesday, Thursday, Saturday, left BKA, hypertension, hyperlipidemia who presents with dizziness.  Patient reports that she was seen and evaluated last week for the same.  This was thought to be related to low blood pressure.  She states that that time she passed out at dialysis.  Patient reports that she had dialysis on Thursday and on Saturday.  She states that she had 2 additional episodes of passing out during these dialysis sessions.  Patient reports some dizziness.  She does not ambulate but does report some dizziness with sitting up.  No focal weakness, numbness, tingling, speech difficulties.  She did recently have a supratherapeutic INR and has had some changes in her Coumadin.  Additionally, she reports a decrease in her Levemir.  She reports that she was encouraged by dialysis to be evaluated yesterday during the day but did not want to come.  Currently she denies any dizziness, chest pain, shortness of breath, abdominal pain.  She is unsure of her dry weight.  I reviewed her chart.  Patient was admitted for observation earlier this week for syncope.  At that time her work-up was reassuring including EKG, lab work, CT head.  Her recurrent syncopal episodes was thought to be due to hypotension related to dialysis.  Past Medical History:  Diagnosis Date  . Anemia   . Arthritis   . Diabetes mellitus   . End-stage renal disease on hemodialysis (HCC)    dialysis T/Th/Sa  . Hx of BKA, left (HCC)   . Hyperlipidemia   . Hypertension   . Pneumonia     Patient Active Problem List   Diagnosis Date Noted  . Syncope and collapse 04/26/2018    . Long term (current) use of anticoagulants [Z79.01] 07/30/2017  . Atrial fibrillation (HCC) 07/23/2017  . Medication management 07/23/2017  . Essential hypertension 07/23/2017  . Mixed hyperlipidemia 07/23/2017  . PAD (peripheral artery disease) (HCC) 07/23/2017  . S/P unilateral BKA (below knee amputation) (HCC) 04/24/2015  . 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 Leukocytosis, unspecified 04/22/2013  . HTN (hypertension), benign 04/18/2013  . Diabetes mellitus (HCC) 04/18/2013  . Syncope 04/18/2013  . Anemia 04/18/2013  . ESRD (end stage renal disease) on dialysis (HCC) 09/02/2011    Past Surgical History:  Procedure Laterality Date  . ABDOMINAL AORTOGRAM W/LOWER EXTREMITY N/A 12/01/2017   Procedure: ABDOMINAL AORTOGRAM W/LOWER EXTREMITY;  Surgeon: Nada Libman, MD;  Location: [facility] INVASIVE CV LAB;  Service: Cardiovascular;  Laterality: N/A;  bilateral  . AMPUTATION Left 04/24/2015   Procedure: LEFT BELOW KNEE AMPUTATION;  Surgeon: Nadara Mustard, MD;  Location: [facility] OR;  Service: Orthopedics;  Laterality: Left;  . ARTERIOVENOUS GRAFT PLACEMENT Left    non function  . AV FISTULA PLACEMENT Right 01/30/2013   Procedure: ARTERIOVENOUS (AV) FISTULA CREATION;  Surgeon: Fransisco Hertz, MD;  Location: [facility] OR;  Service: Vascular;  Laterality: Right;  . AV FISTULA PLACEMENT Left 10/04/2017   Procedure: INSERTION OF ARTERIOVENOUS (AV) GORE-TEX GRAFT LEFT UPPER ARM;  Surgeon: Chuck Hint, MD;  Location: [facility] OR;  Service: Vascular;  Laterality: Left;  . CESAREAN  SECTION    . EYE SURGERY Bilateral    catarct  . FISTULA SUPERFICIALIZATION Right 04/24/2013   Procedure: FISTULA SUPERFICIALIZATION;  Surgeon: Larina Earthly, MD;  Location: [facility] OR;  Service: Vascular;  Laterality: Right;  . FISTULOGRAM Right 07/26/2013   Procedure: FISTULOGRAM;  Surgeon: Larina Earthly, MD;  Location: [facility] CATH LAB;  Service: Cardiovascular;  Laterality: Right;  . INSERTION OF DIALYSIS CATHETER Right  04/24/2013   Procedure: INSERTION OF DIALYSIS CATHETER;  Surgeon: Larina Earthly, MD;  Location: [facility] OR;  Service: Vascular;  Laterality: Right;  . PERIPHERAL VASCULAR ATHERECTOMY  12/01/2017   Procedure: PERIPHERAL VASCULAR ATHERECTOMY;  Surgeon: Nada Libman, MD;  Location: [facility] INVASIVE CV LAB;  Service: Cardiovascular;;  Rt. SFA  . PERIPHERAL VASCULAR CATHETERIZATION N/A 12/05/2014   Procedure: Abdominal Aortogram;  Surgeon: Fransisco Hertz, MD;  Location: [facility] INVASIVE CV LAB;  Service: Cardiovascular;  Laterality: N/A;  . PERIPHERAL VASCULAR CATHETERIZATION N/A 03/19/2015   Procedure: Lower Extremity Angiography;  Surgeon: Nada Libman, MD;  Location: [facility] INVASIVE CV LAB;  Service: Cardiovascular;  Laterality: N/A;  . PERIPHERAL VASCULAR CATHETERIZATION  03/19/2015   Procedure: Peripheral Vascular Intervention;  Surgeon: Nada Libman, MD;  Location: [facility] INVASIVE CV LAB;  Service: Cardiovascular;;  left sfa and popliteal stent  . TONSILLECTOMY    . UPPER EXTREMITY VENOGRAPHY N/A 09/17/2017   Procedure: UPPER EXTREMITY VENOGRAPHY;  Surgeon: Chuck Hint, MD;  Location: [facility] INVASIVE CV LAB;  Service: Cardiovascular;  Laterality: N/A;     OB History   None      Home Medications    Prior to Admission medications   Medication Sig Start Date End Date Taking? Authorizing Provider  albuterol (PROVENTIL HFA;VENTOLIN HFA) 108 (90 BASE) MCG/ACT inhaler Inhale 1-2 puffs into the lungs every 6 (six) hours as needed for wheezing. 12/27/12   Palumbo, April, MD  b complex-vitamin c-folic acid (NEPHRO-VITE) 0.8 MG TABS tablet Take 1 tablet by mouth at bedtime.    [provider]  calcium acetate (PHOSLO) 667 MG capsule Take 2,001 mg by mouth 3 (three) times daily with meals.    [provider]  cinacalcet (SENSIPAR) 30 MG tablet Take 30 mg by mouth daily.    [provider]  insulin detemir (LEVEMIR) 100 UNIT/ML injection Inject 0.08 mLs (8 Units total) into the skin at  bedtime. 04/27/18   Joseph Art, DO  lidocaine-prilocaine (EMLA) cream Apply 1 application topically daily as needed (prior to dialysis).  11/24/17   [provider]  promethazine (PHENERGAN) 12.5 MG tablet Take 12.5 mg by mouth daily as needed for nausea or vomiting.  07/22/17   [provider]  sevelamer carbonate (RENVELA) 2.4 g PACK Take 2.4 g by mouth 3 (three) times daily with meals.  09/06/17   [provider]  traMADol (ULTRAM) 50 MG tablet Take 50 mg by mouth every 6 (six) hours as needed for moderate pain or severe pain.    [provider]  warfarin (COUMADIN) 5 MG tablet TAKE 1 to 1.5 TABLETS BY MOUTH DAILY AS DIRECTED BY THE COUMADIN CLINIC. 04/29/18   Joseph Art, DO    Family History Family History  Problem Relation Age of Onset  . Diabetes Mother   . Heart disease Mother   . Hypertension Mother     Social History Social History   Tobacco Use  . Smoking status: Never Smoker  . Smokeless tobacco: Never Used  Substance Use Topics  . Alcohol  use: No  . Drug use: No     Allergies   Patient has no known allergies.   Review of Systems Review of Systems  Constitutional: Negative for fever.  Respiratory: Negative for chest tightness and shortness of breath.   Cardiovascular: Negative for chest pain.  Gastrointestinal: Negative for abdominal pain and vomiting.  Genitourinary: Negative for dysuria.  Neurological: Positive for dizziness and syncope.  All other systems reviewed and are negative.    Physical Exam Updated Vital Signs BP 94/69   Pulse 90   Temp 98.7 F (37.1 C) (Oral)   Resp 14   Ht 1.727 m (5\' 8" )   Wt 61 kg   SpO2 92%   BMI 20.45 kg/m   Physical Exam  Constitutional: She is oriented to person, place, and time.  Elderly, chronically ill-appearing, no acute distress  HENT:  Head: Normocephalic and atraumatic.  Eyes: Pupils are equal, round, and reactive to light.  Cardiovascular: Normal rate, regular  rhythm and normal heart sounds.  Pulmonary/Chest: Effort normal and breath sounds normal. No respiratory distress. She has no wheezes.  Abdominal: Soft. Bowel sounds are normal.  Musculoskeletal:  Left BKA  Neurological: She is alert and oriented to person, place, and time.  Speech, cranial nerves II through XII intact, 5 out of 5 strength in all 4 extremities  Skin: Skin is warm and dry.  Psychiatric: She has a normal mood and affect.  Nursing note and vitals reviewed.    ED Treatments / Results  Labs (all labs ordered are listed, but only abnormal results are displayed) Labs Reviewed  CBC WITH DIFFERENTIAL/PLATELET - Abnormal; Notable for the following components:      Result Value   RBC 3.25 (*)    Hemoglobin 9.1 (*)    HCT 30.7 (*)    MCHC 29.6 (*)    RDW 17.1 (*)    Platelets 72 (*)    nRBC 0.5 (*)    All other components within normal limits  BASIC METABOLIC PANEL - Abnormal; Notable for the following components:   Creatinine, Ser 3.36 (*)    GFR calc non Af Amer 12 (*)    GFR calc Af Amer 14 (*)    All other components within normal limits  PROTIME-INR - Abnormal; Notable for the following components:   Prothrombin Time 21.4 (*)    All other components within normal limits  URINALYSIS, ROUTINE W REFLEX MICROSCOPIC    EKG EKG Interpretation  Date/Time:  Sunday May 01 2018 04:39:27 EDT Ventricular Rate:  102 PR Interval:    QRS Duration: 94 QT Interval:  380 QTC Calculation: 495 R Axis:   125 Text Interpretation:  Atrial fibrillation Right axis deviation Low voltage, extremity leads Borderline prolonged QT interval Confirmed by Ross Marcus ([id number]) on 05/01/2018 5:38:26 AM   Radiology No results found.  Procedures Procedures (including critical care time)  Medications Ordered in ED Medications - No data to display   Initial Impression / Assessment and Plan / ED Course  I have reviewed the triage vital signs and the nursing notes.  Pertinent  labs & imaging results that were available during my care of the patient were reviewed by me and considered in my medical decision making (see chart for details).     She presents today with recurrent syncope correlating with dialysis.  Patient was admitted for observation for the same October 8.  Work-up is largely reassuring.  She is hemodynamically stable at this time.  Per EMS, was orthostatic.  Blood pressures ranged 94 toe 130 systolic.  No recent changes in medications.  EKG shows no signs of arrhythmia.  No evidence of anemia on exam or lab work.  Given correlation with hemodialysis, do suspect that this is likely related to blood pressure drops during dialysis.  She has been stable while in the emergency department without any recurrent events.  I have encouraged her family to discuss with her nephrologist regarding fluid pulled during dialysis.  They stated understanding.  After history, exam, and medical workup I feel the patient has been appropriately medically screened and is safe for discharge home. Pertinent diagnoses were discussed with the patient. Patient was given return precautions.   Final Clinical Impressions(s) / ED Diagnoses   Final diagnoses:  Hemodialysis-associated hypotension  Vasovagal syncope    ED Discharge Orders    None       Shon Baton, MD 05/01/18 [phone]

## 2018-05-01 NOTE — Discharge Instructions (Addendum)
You were seen today for passing out.  This may be related to having volume taken off during dialysis sessions.  Talk to Dr. Louanne Skye about reducing volumes.

## 2018-05-01 NOTE — ED Notes (Signed)
Patient verbalizes understanding of discharge instructions. Opportunity for questioning and answers were provided. Armband removed by staff, pt discharged from ED.  

## 2018-05-02 ENCOUNTER — Ambulatory Visit (INDEPENDENT_AMBULATORY_CARE_PROVIDER_SITE_OTHER): Payer: Medicare Other | Admitting: Pharmacist

## 2018-05-02 DIAGNOSIS — I4891 Unspecified atrial fibrillation: Secondary | ICD-10-CM

## 2018-05-02 DIAGNOSIS — Z7901 Long term (current) use of anticoagulants: Secondary | ICD-10-CM | POA: Diagnosis not present

## 2018-05-02 LAB — POCT INR: INR: 1.9 — AB (ref 2.0–3.0)

## 2018-05-03 DIAGNOSIS — Z23 Encounter for immunization: Secondary | ICD-10-CM | POA: Diagnosis not present

## 2018-05-03 DIAGNOSIS — E1129 Type 2 diabetes mellitus with other diabetic kidney complication: Secondary | ICD-10-CM | POA: Diagnosis not present

## 2018-05-03 DIAGNOSIS — N2581 Secondary hyperparathyroidism of renal origin: Secondary | ICD-10-CM | POA: Diagnosis not present

## 2018-05-03 DIAGNOSIS — D631 Anemia in chronic kidney disease: Secondary | ICD-10-CM | POA: Diagnosis not present

## 2018-05-03 DIAGNOSIS — D509 Iron deficiency anemia, unspecified: Secondary | ICD-10-CM | POA: Diagnosis not present

## 2018-05-03 DIAGNOSIS — N186 End stage renal disease: Secondary | ICD-10-CM | POA: Diagnosis not present

## 2018-05-05 DIAGNOSIS — Z23 Encounter for immunization: Secondary | ICD-10-CM | POA: Diagnosis not present

## 2018-05-05 DIAGNOSIS — D509 Iron deficiency anemia, unspecified: Secondary | ICD-10-CM | POA: Diagnosis not present

## 2018-05-05 DIAGNOSIS — E1129 Type 2 diabetes mellitus with other diabetic kidney complication: Secondary | ICD-10-CM | POA: Diagnosis not present

## 2018-05-05 DIAGNOSIS — N186 End stage renal disease: Secondary | ICD-10-CM | POA: Diagnosis not present

## 2018-05-05 DIAGNOSIS — N2581 Secondary hyperparathyroidism of renal origin: Secondary | ICD-10-CM | POA: Diagnosis not present

## 2018-05-05 DIAGNOSIS — D631 Anemia in chronic kidney disease: Secondary | ICD-10-CM | POA: Diagnosis not present

## 2018-05-06 ENCOUNTER — Ambulatory Visit (INDEPENDENT_AMBULATORY_CARE_PROVIDER_SITE_OTHER): Payer: Medicare Other | Admitting: Pharmacist Clinician (PhC)/ Clinical Pharmacy Specialist

## 2018-05-06 DIAGNOSIS — Z7901 Long term (current) use of anticoagulants: Secondary | ICD-10-CM | POA: Diagnosis not present

## 2018-05-06 DIAGNOSIS — I4891 Unspecified atrial fibrillation: Secondary | ICD-10-CM

## 2018-05-06 LAB — POCT INR: INR: 2.2 (ref 2.0–3.0)

## 2018-05-07 DIAGNOSIS — N2581 Secondary hyperparathyroidism of renal origin: Secondary | ICD-10-CM | POA: Diagnosis not present

## 2018-05-07 DIAGNOSIS — N186 End stage renal disease: Secondary | ICD-10-CM | POA: Diagnosis not present

## 2018-05-07 DIAGNOSIS — E1129 Type 2 diabetes mellitus with other diabetic kidney complication: Secondary | ICD-10-CM | POA: Diagnosis not present

## 2018-05-07 DIAGNOSIS — D631 Anemia in chronic kidney disease: Secondary | ICD-10-CM | POA: Diagnosis not present

## 2018-05-07 DIAGNOSIS — D509 Iron deficiency anemia, unspecified: Secondary | ICD-10-CM | POA: Diagnosis not present

## 2018-05-07 DIAGNOSIS — Z23 Encounter for immunization: Secondary | ICD-10-CM | POA: Diagnosis not present

## 2018-05-10 DIAGNOSIS — N186 End stage renal disease: Secondary | ICD-10-CM | POA: Diagnosis not present

## 2018-05-10 DIAGNOSIS — D509 Iron deficiency anemia, unspecified: Secondary | ICD-10-CM | POA: Diagnosis not present

## 2018-05-10 DIAGNOSIS — N2581 Secondary hyperparathyroidism of renal origin: Secondary | ICD-10-CM | POA: Diagnosis not present

## 2018-05-10 DIAGNOSIS — E1129 Type 2 diabetes mellitus with other diabetic kidney complication: Secondary | ICD-10-CM | POA: Diagnosis not present

## 2018-05-10 DIAGNOSIS — D631 Anemia in chronic kidney disease: Secondary | ICD-10-CM | POA: Diagnosis not present

## 2018-05-10 DIAGNOSIS — Z23 Encounter for immunization: Secondary | ICD-10-CM | POA: Diagnosis not present

## 2018-05-12 DIAGNOSIS — D631 Anemia in chronic kidney disease: Secondary | ICD-10-CM | POA: Diagnosis not present

## 2018-05-12 DIAGNOSIS — Z23 Encounter for immunization: Secondary | ICD-10-CM | POA: Diagnosis not present

## 2018-05-12 DIAGNOSIS — E1129 Type 2 diabetes mellitus with other diabetic kidney complication: Secondary | ICD-10-CM | POA: Diagnosis not present

## 2018-05-12 DIAGNOSIS — N186 End stage renal disease: Secondary | ICD-10-CM | POA: Diagnosis not present

## 2018-05-12 DIAGNOSIS — D509 Iron deficiency anemia, unspecified: Secondary | ICD-10-CM | POA: Diagnosis not present

## 2018-05-12 DIAGNOSIS — N2581 Secondary hyperparathyroidism of renal origin: Secondary | ICD-10-CM | POA: Diagnosis not present

## 2018-05-13 ENCOUNTER — Ambulatory Visit (INDEPENDENT_AMBULATORY_CARE_PROVIDER_SITE_OTHER): Payer: Medicare Other | Admitting: Pharmacist

## 2018-05-13 DIAGNOSIS — Z7901 Long term (current) use of anticoagulants: Secondary | ICD-10-CM

## 2018-05-13 DIAGNOSIS — I4891 Unspecified atrial fibrillation: Secondary | ICD-10-CM

## 2018-05-13 LAB — POCT INR: INR: 1.8 — AB (ref 2.0–3.0)

## 2018-05-14 DIAGNOSIS — Z23 Encounter for immunization: Secondary | ICD-10-CM | POA: Diagnosis not present

## 2018-05-14 DIAGNOSIS — N2581 Secondary hyperparathyroidism of renal origin: Secondary | ICD-10-CM | POA: Diagnosis not present

## 2018-05-14 DIAGNOSIS — D509 Iron deficiency anemia, unspecified: Secondary | ICD-10-CM | POA: Diagnosis not present

## 2018-05-14 DIAGNOSIS — D631 Anemia in chronic kidney disease: Secondary | ICD-10-CM | POA: Diagnosis not present

## 2018-05-14 DIAGNOSIS — E1129 Type 2 diabetes mellitus with other diabetic kidney complication: Secondary | ICD-10-CM | POA: Diagnosis not present

## 2018-05-14 DIAGNOSIS — N186 End stage renal disease: Secondary | ICD-10-CM | POA: Diagnosis not present

## 2018-05-16 DIAGNOSIS — E1122 Type 2 diabetes mellitus with diabetic chronic kidney disease: Secondary | ICD-10-CM | POA: Diagnosis not present

## 2018-05-16 DIAGNOSIS — I10 Essential (primary) hypertension: Secondary | ICD-10-CM | POA: Diagnosis not present

## 2018-05-16 DIAGNOSIS — N186 End stage renal disease: Secondary | ICD-10-CM | POA: Diagnosis not present

## 2018-05-17 DIAGNOSIS — N2581 Secondary hyperparathyroidism of renal origin: Secondary | ICD-10-CM | POA: Diagnosis not present

## 2018-05-17 DIAGNOSIS — D631 Anemia in chronic kidney disease: Secondary | ICD-10-CM | POA: Diagnosis not present

## 2018-05-17 DIAGNOSIS — D509 Iron deficiency anemia, unspecified: Secondary | ICD-10-CM | POA: Diagnosis not present

## 2018-05-17 DIAGNOSIS — E1129 Type 2 diabetes mellitus with other diabetic kidney complication: Secondary | ICD-10-CM | POA: Diagnosis not present

## 2018-05-17 DIAGNOSIS — Z23 Encounter for immunization: Secondary | ICD-10-CM | POA: Diagnosis not present

## 2018-05-17 DIAGNOSIS — N186 End stage renal disease: Secondary | ICD-10-CM | POA: Diagnosis not present

## 2018-05-19 DIAGNOSIS — N2581 Secondary hyperparathyroidism of renal origin: Secondary | ICD-10-CM | POA: Diagnosis not present

## 2018-05-19 DIAGNOSIS — E1129 Type 2 diabetes mellitus with other diabetic kidney complication: Secondary | ICD-10-CM | POA: Diagnosis not present

## 2018-05-19 DIAGNOSIS — Z23 Encounter for immunization: Secondary | ICD-10-CM | POA: Diagnosis not present

## 2018-05-19 DIAGNOSIS — D631 Anemia in chronic kidney disease: Secondary | ICD-10-CM | POA: Diagnosis not present

## 2018-05-19 DIAGNOSIS — D509 Iron deficiency anemia, unspecified: Secondary | ICD-10-CM | POA: Diagnosis not present

## 2018-05-19 DIAGNOSIS — N186 End stage renal disease: Secondary | ICD-10-CM | POA: Diagnosis not present

## 2018-05-20 ENCOUNTER — Ambulatory Visit (INDEPENDENT_AMBULATORY_CARE_PROVIDER_SITE_OTHER): Payer: Medicare Other | Admitting: Pharmacist Clinician (PhC)/ Clinical Pharmacy Specialist

## 2018-05-20 DIAGNOSIS — I4891 Unspecified atrial fibrillation: Secondary | ICD-10-CM | POA: Diagnosis not present

## 2018-05-20 DIAGNOSIS — Z7901 Long term (current) use of anticoagulants: Secondary | ICD-10-CM

## 2018-05-20 DIAGNOSIS — E1129 Type 2 diabetes mellitus with other diabetic kidney complication: Secondary | ICD-10-CM | POA: Diagnosis not present

## 2018-05-20 DIAGNOSIS — N186 End stage renal disease: Secondary | ICD-10-CM | POA: Diagnosis not present

## 2018-05-20 DIAGNOSIS — Z992 Dependence on renal dialysis: Secondary | ICD-10-CM | POA: Diagnosis not present

## 2018-05-20 LAB — POCT INR: INR: 3 (ref 2.0–3.0)

## 2018-05-21 ENCOUNTER — Emergency Department (HOSPITAL_COMMUNITY)
Admission: EM | Admit: 2018-05-21 | Discharge: 2018-05-21 | Disposition: A | Discharge status: Home / Self Care | Payer: Medicare Other | Attending: Emergency Medicine | Admitting: Emergency Medicine

## 2018-05-21 ENCOUNTER — Encounter (HOSPITAL_COMMUNITY): Payer: Self-pay

## 2018-05-21 ENCOUNTER — Emergency Department (HOSPITAL_COMMUNITY): Payer: Medicare Other

## 2018-05-21 DIAGNOSIS — Z79899 Other long term (current) drug therapy: Secondary | ICD-10-CM | POA: Insufficient documentation

## 2018-05-21 DIAGNOSIS — I10 Essential (primary) hypertension: Secondary | ICD-10-CM | POA: Diagnosis not present

## 2018-05-21 DIAGNOSIS — Z794 Long term (current) use of insulin: Secondary | ICD-10-CM | POA: Diagnosis not present

## 2018-05-21 DIAGNOSIS — D509 Iron deficiency anemia, unspecified: Secondary | ICD-10-CM | POA: Diagnosis not present

## 2018-05-21 DIAGNOSIS — R55 Syncope and collapse: Secondary | ICD-10-CM | POA: Diagnosis not present

## 2018-05-21 DIAGNOSIS — N186 End stage renal disease: Secondary | ICD-10-CM | POA: Diagnosis not present

## 2018-05-21 DIAGNOSIS — R000 Tachycardia, unspecified: Secondary | ICD-10-CM | POA: Diagnosis not present

## 2018-05-21 DIAGNOSIS — R4020 Unspecified coma: Secondary | ICD-10-CM | POA: Diagnosis not present

## 2018-05-21 DIAGNOSIS — N2581 Secondary hyperparathyroidism of renal origin: Secondary | ICD-10-CM | POA: Diagnosis not present

## 2018-05-21 DIAGNOSIS — E1129 Type 2 diabetes mellitus with other diabetic kidney complication: Secondary | ICD-10-CM | POA: Diagnosis not present

## 2018-05-21 DIAGNOSIS — E119 Type 2 diabetes mellitus without complications: Secondary | ICD-10-CM | POA: Insufficient documentation

## 2018-05-21 LAB — CBC
HEMATOCRIT: 36.3 % (ref 36.0–46.0)
Hemoglobin: 10.9 g/dL — ABNORMAL LOW (ref 12.0–15.0)
MCH: 27.5 pg (ref 26.0–34.0)
MCHC: 30 g/dL (ref 30.0–36.0)
MCV: 91.4 fL (ref 80.0–100.0)
PLATELETS: 78 10*3/uL — AB (ref 150–400)
RBC: 3.97 MIL/uL (ref 3.87–5.11)
RDW: 16.2 % — AB (ref 11.5–15.5)
WBC: 7.3 10*3/uL (ref 4.0–10.5)
nRBC: 0 % (ref 0.0–0.2)

## 2018-05-21 LAB — CBG MONITORING, ED
GLUCOSE-CAPILLARY: 66 mg/dL — AB (ref 70–99)
Glucose-Capillary: 66 mg/dL — ABNORMAL LOW (ref 70–99)
Glucose-Capillary: 137 mg/dL — ABNORMAL HIGH (ref 70–99)

## 2018-05-21 LAB — BASIC METABOLIC PANEL
Anion gap: 12 (ref 5–15)
BUN: 13 mg/dL (ref 8–23)
CALCIUM: 9.1 mg/dL (ref 8.9–10.3)
CO2: 27 mmol/L (ref 22–32)
CREATININE: 4.19 mg/dL — AB (ref 0.44–1.00)
Chloride: 98 mmol/L (ref 98–111)
GFR calc Af Amer: 11 mL/min — ABNORMAL LOW (ref >60)
GFR calc non Af Amer: 9 mL/min — ABNORMAL LOW (ref >60)
GLUCOSE: 68 mg/dL — AB (ref 70–99)
Potassium: 3.6 mmol/L (ref 3.5–5.1)
Sodium: 137 mmol/L (ref 135–145)

## 2018-05-21 LAB — PROTIME-INR
INR: 2.5
Prothrombin Time: 26.7 seconds — ABNORMAL HIGH (ref 11.4–15.2)

## 2018-05-21 MED ORDER — SODIUM CHLORIDE 0.9 % IV BOLUS
250.0000 mL | Freq: Once | INTRAVENOUS | Status: AC
  Administered 2018-05-21: 250 mL via INTRAVENOUS

## 2018-05-21 MED ORDER — METOPROLOL TARTRATE 5 MG/5ML IV SOLN
2.5000 mg | Freq: Once | INTRAVENOUS | Status: AC
  Administered 2018-05-21: 2.5 mg via INTRAVENOUS
  Filled 2018-05-21: qty 5

## 2018-05-21 NOTE — ED Triage Notes (Signed)
Pt presents with syncopal episode after 1 hour of HD treatment today.  Pt reports h/o same, but reports it was different today.  Pt reports today when she regained consciousness, she wasn't "quite herself".  Pt is not confused, denies any pain or discomfort.

## 2018-05-21 NOTE — Discharge Instructions (Addendum)
Follow-up with your primary care physician, they should be able to set up a heart monitor as an outpatient.  Otherwise, be sure to drink fluids as directed by your nephrologist and eat well-balanced meals to help prevent your sugar from going low.

## 2018-05-21 NOTE — ED Provider Notes (Signed)
[facility] EMERGENCY DEPARTMENT Provider Note   CSN: [id number] Arrival date & time: 05/21/18  1347     History   Chief Complaint Chief Complaint  Patient presents with  . Loss of Consciousness    HPI Jackie [name] is a 83 y.o. female.  HPI  83 year old female with end-stage renal disease on dialysis presents with syncope.  She was at dialysis and about an hour into her treatment she all of a sudden started to not feel well.  Shortly thereafter she was told she passed out for about a minute.  No reported seizure-like activity.  This is happened to her multiple times, all at dialysis.  She is feeling better now.  She wonders if she might be dehydrated as she has been told she does not drink enough water by the dialysis nurses when they are performing her dialysis.  She is had a cough and some rhinorrhea for about a week.  She denies fevers, chest pain, shortness of breath.  No headache.  Denies any abdominal pain or vomiting/diarrhea.  She states this is very similar to the other episodes that have occurred at dialysis.  She states she drank about 1/2 cup of coffee and ate some toast but has not had any other p.o. intake this morning.  Past Medical History:  Diagnosis Date  . Anemia   . Arthritis   . Diabetes mellitus   . End-stage renal disease on hemodialysis (HCC)    dialysis T/Th/Sa  . Hx of BKA, left (HCC)   . Hyperlipidemia   . Hypertension   . Pneumonia     Patient Active Problem List   Diagnosis Date Noted  . Syncope and collapse 04/26/2018  . Long term (current) use of anticoagulants [Z79.01] 07/30/2017  . Atrial fibrillation (HCC) 07/23/2017  . Medication management 07/23/2017  . Essential hypertension 07/23/2017  . Mixed hyperlipidemia 07/23/2017  . PAD (peripheral artery disease) (HCC) 07/23/2017  . S/P unilateral BKA (below knee amputation) (HCC) 04/24/2015  . 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 Leukocytosis,  unspecified 04/22/2013  . HTN (hypertension), benign 04/18/2013  . Diabetes mellitus (HCC) 04/18/2013  . Syncope 04/18/2013  . Anemia 04/18/2013  . ESRD (end stage renal disease) on dialysis (HCC) 09/02/2011    Past Surgical History:  Procedure Laterality Date  . ABDOMINAL AORTOGRAM W/LOWER EXTREMITY N/A 12/01/2017   Procedure: ABDOMINAL AORTOGRAM W/LOWER EXTREMITY;  Surgeon: Nada Libman, MD;  [facility] INV[facility] LAB;  Service: Cardiovascular;  Laterality: N/A;  bilateral  . AMPUTATION Left 04/24/2015   Procedure: LEFT BELOW KNEE AMPUTATION;  Surgeon: Nadara Mustard, MD;  Location: [facility] OR;  Service: Orthopedics;  Laterality: Left;  . ARTERIOVENOUS GRAFT PLACEMENT Left    non function  . AV FISTULA PLACEMENT Right 01/30/2013   Procedure: ARTERIOVENOUS (AV) FISTULA CREATION;  Surgeon: Fransisco Hertz, MD;  Location: [facility] OR;  Service: Vascular;  Laterality: Right;  . AV FISTULA PLACEMENT Left 10/04/2017   Procedure: INSERTION OF ARTERIOVENOUS (AV) GORE-TEX GRAFT LEFT UPPER ARM;  Surgeon: Chuck Hint, MD;  Location: [facility] OR;  Service: Vascular;  Laterality: Left;  . CESAREAN SECTION    . EYE SURGERY Bilateral    catarct  . FISTULA SUPERFICIALIZATION Right 04/24/2013   Procedure: FISTULA SUPERFICIALIZATION;  Surgeon: Larina Earthly, MD;  Location: [facility] OR;  Service: Vascular;  Laterality: Right;  . FISTULOGRAM Right 07/26/2013   Procedure: FISTULOGRAM;  Surgeon: Larina Earthly, MD;  Location: [facility] CATH  LAB;  Service: Cardiovascular;  Laterality: Right;  . INSERTION OF DIALYSIS CATHETER Right 04/24/2013   Procedure: INSERTION OF DIALYSIS CATHETER;  Surgeon: Larina Earthly, MD;  Location: [facility] OR;  Service: Vascular;  Laterality: Right;  . PERIPHERAL VASCULAR ATHERECTOMY  12/01/2017   Procedure: PERIPHERAL VASCULAR ATHERECTOMY;  Surgeon: Nada Libman, MD;  [facility] INV[facility] LAB;  Service: Cardiovascular;;  Rt. SFA  . PERIPHERAL VASCULAR CATHETERIZATION N/A 12/05/2014   Procedure: Abdominal  Aortogram;  Surgeon: Fransisco Hertz, MD;  Location: [facility] INVASIVE CV LAB;  Service: Cardiovascular;  Laterality: N/A;  . PERIPHERAL VASCULAR CATHETERIZATION N/A 03/19/2015   Procedure: Lower Extremity Angiography;  Surgeon: Nada Libman, MD;  [facility] INVAS[facility] CV LAB;  Service: Cardiovascular;  Laterality: N/A;  . PERIPHERAL VASCULAR CATHETERIZATION  03/19/2015   Procedure: Peripheral Vascular Intervention;  Surgeon: Nada Libman, MD;  [facility] INVAS[facility] CV LAB;  Service: Cardiovascular;;  left sfa and popliteal stent  . TONSILLECTOMY    . UPPER EXTREMITY VENOGRAPHY N/A 09/17/2017   Procedure: UPPER EXTREMITY VENOGRAPHY;  Surgeon: Chuck Hint, MD;  Location: [facility] INVASIVE CV LAB;  Service: Cardiovascular;  Laterality: N/A;     OB History   None      Home Medications    Prior to Admission medications   Medication Sig Start Date End Date Taking? Authorizing Provider  albuterol (PROVENTIL HFA;VENTOLIN HFA) 108 (90 BASE) MCG/ACT inhaler Inhale 1-2 puffs into the lungs every 6 (six) hours as needed for wheezing. 12/27/12   Palumbo, April, MD  b complex-vitamin c-folic acid (NEPHRO-VITE) 0.8 MG TABS tablet Take 1 tablet by mouth at bedtime.    [provider]  calcium acetate (PHOSLO) 667 MG capsule Take 2,001 mg by mouth 3 (three) times daily with meals.    [provider]  cinacalcet (SENSIPAR) 30 MG tablet Take 30 mg by mouth daily.    [provider]  insulin detemir (LEVEMIR) 100 UNIT/ML injection Inject 0.08 mLs (8 Units total) into the skin at bedtime. 04/27/18   Joseph Art, DO  lidocaine-prilocaine (EMLA) cream Apply 1 application topically daily as needed (prior to dialysis).  11/24/17   [provider]  promethazine (PHENERGAN) 12.5 MG tablet Take 12.5 mg by mouth daily as needed for nausea or vomiting.  07/22/17   [provider]  sevelamer carbonate (RENVELA) 2.4 g PACK Take 2.4 g by mouth 3 (three) times daily with meals.   09/06/17   [provider]  traMADol (ULTRAM) 50 MG tablet Take 50 mg by mouth every 6 (six) hours as needed for moderate pain or severe pain.    [provider]  warfarin (COUMADIN) 5 MG tablet TAKE 1 to 1.5 TABLETS BY MOUTH DAILY AS DIRECTED BY THE COUMADIN CLINIC. 04/29/18   Joseph Art, DO    Family History Family History  Problem Relation Age of Onset  . Diabetes Mother   . Heart disease Mother   . Hypertension Mother     Social History Social History   Tobacco Use  . Smoking status: Never Smoker  . Smokeless tobacco: Never Used  Substance Use Topics  . Alcohol use: No  . Drug use: No     Allergies   Patient has no known allergies.   Review of Systems Review of Systems  Constitutional: Negative for fever.  HENT: Positive for rhinorrhea.   Eyes: Positive for discharge and itching.  Respiratory: Positive for cough. Negative for shortness of breath.   Cardiovascular:  Negative for chest pain.  Gastrointestinal: Negative for abdominal pain, diarrhea and vomiting.  Neurological: Positive for syncope. Negative for headaches.  All other systems reviewed and are negative.    Physical Exam Updated Vital Signs BP 122/78   Pulse (!) 113   Temp 98.6 F (37 C) (Rectal)   Resp 16   SpO2 100%   Physical Exam  Constitutional: She is oriented to person, place, and time. She appears well-developed and well-nourished. No distress.  HENT:  Head: Normocephalic and atraumatic.  Right Ear: External ear normal.  Left Ear: External ear normal.  Nose: Nose normal.  Eyes: Pupils are equal, round, and reactive to light. EOM are normal. Right eye exhibits no discharge. Left eye exhibits no discharge.  Cardiovascular: An irregular rhythm present. Tachycardia present.  HR~110  Pulmonary/Chest: Effort normal and breath sounds normal.  Abdominal: Soft. There is no tenderness.  Musculoskeletal:  Left bka  Neurological: She is alert and oriented to person,  place, and time.  CN 3-12 grossly intact. 5/5 strength in all 4 extremities. Grossly normal sensation. Normal finger to nose.   Skin: Skin is warm and dry. She is not diaphoretic.  Psychiatric: Her mood appears not anxious.  Nursing note and vitals reviewed.    ED Treatments / Results  Labs (all labs ordered are listed, but only abnormal results are displayed) Labs Reviewed  BASIC METABOLIC PANEL - Abnormal; Notable for the following components:      Result Value   Glucose, Bld 68 (*)    Creatinine, Ser 4.19 (*)    GFR calc non Af Amer 9 (*)    GFR calc Af Amer 11 (*)    All other components within normal limits  CBC - Abnormal; Notable for the following components:   Hemoglobin 10.9 (*)    RDW 16.2 (*)    Platelets 78 (*)    All other components within normal limits  PROTIME-INR - Abnormal; Notable for the following components:   Prothrombin Time 26.7 (*)    All other components within normal limits  CBG MONITORING, ED - Abnormal; Notable for the following components:   Glucose-Capillary 66 (*)    All other components within normal limits  CBG MONITORING, ED - Abnormal; Notable for the following components:   Glucose-Capillary 66 (*)    All other components within normal limits  CBG MONITORING, ED - Abnormal; Notable for the following components:   Glucose-Capillary 137 (*)    All other components within normal limits  CBG MONITORING, ED  CBG MONITORING, ED    EKG EKG Interpretation  Date/Time:  Saturday May 21 2018 14:04:06 EDT Ventricular Rate:  116 PR Interval:    QRS Duration: 90 QT Interval:  347 QTC Calculation: 482 R Axis:   54 Text Interpretation:  Atrial fibrillation Paired ventricular premature complexes Low voltage, extremity leads Consider anterior infarct Nonspecific T abnormalities, lateral leads similar to Oct 2019 Confirmed by Pricilla Loveless [phone]) on 05/21/2018 2:43:32 PM   Radiology Dg Chest 2 View  Result Date: 05/21/2018 CLINICAL  DATA:  Syncopal episode following HD treatment today EXAM: CHEST - 2 VIEW COMPARISON:  03/30/2018 FINDINGS: Cardiac shadow is enlarged but stable. The lungs are well aerated bilaterally. Stable fibrotic changes are noted in the bases. Extensive stenting is noted on the right. No bony abnormality is seen. IMPRESSION: Chronic changes without acute abnormality. Electronically Signed   By: Alcide Clever M.D.   On: 05/21/2018 17:19    Procedures Procedures (including critical care time)  Medications Ordered in ED Medications  sodium chloride 0.9 % bolus 250 mL (0 mLs Intravenous Stopped 05/21/18 1547)  metoprolol tartrate (LOPRESSOR) injection 2.5 mg (2.5 mg Intravenous Given 05/21/18 1617)     Initial Impression / Assessment and Plan / ED Course  I have reviewed the triage vital signs and the nursing notes.  Pertinent labs & imaging results that were available during my care of the patient were reviewed by me and considered in my medical decision making (see chart for details).     The syncope is a recurrent issue and only occurs at dialysis.  She is noted to be mildly hypoglycemic at 68 but this came up with eating.  This may have also contributed.  I doubt this is an arrhythmia specifically because this only occurs a dialysis and more likely is related to when they are taking fluid.  She is noted to be in A. fib which is a chronic issue but she is not on any type of rate control.  Heart rate is slightly elevated in the 1 teens, was given a small dose of metoprolol with some relief.  Heart rate now in the 90s and low 100s.  I discussed with cardiology, Dr. Allena Earing, who recommends no further treatment at this point and she can follow-up with Dr. Rennis Golden or PCP as an outpatient for outpatient monitoring and outpatient discussion of medications for the A. fib.  She is not in RVR otherwise.  Last heart rate is in the low 100s.  She appears stable for discharge home as I have low suspicion for  life-threatening arrhythmia or other serious cause of syncope.  CHA2DS2/VAS Stroke Risk Points  Current as of 24 minutes ago     5 >= 2 Points: High Risk  1 - 1.99 Points: Medium Risk  0 Points: Low Risk    The patient's score has not changed in the past year.:  No Change     Details    This score determines the patient's risk of having a stroke if the  patient has atrial fibrillation.       Points Metrics  0 Has Congestive Heart Failure:  No    Current as of 24 minutes ago  0 Has Vascular Disease:  No    Current as of 24 minutes ago  1 Has Hypertension:  Yes    Current as of 24 minutes ago  2 Age:  33    Current as of 24 minutes ago  1 Has Diabetes:  Yes    Current as of 24 minutes ago  0 Had Stroke:  No  Had TIA:  No  Had thromboembolism:  No    Current as of 24 minutes ago  1 Female:  Yes    Current as of 24 minutes ago             Final Clinical Impressions(s) / ED Diagnoses   Final diagnoses:  Syncope, unspecified syncope type    ED Discharge Orders    None       Pricilla Loveless, MD 05/21/18 2133

## 2018-05-23 DIAGNOSIS — Z0000 Encounter for general adult medical examination without abnormal findings: Secondary | ICD-10-CM | POA: Diagnosis not present

## 2018-05-24 DIAGNOSIS — E1129 Type 2 diabetes mellitus with other diabetic kidney complication: Secondary | ICD-10-CM | POA: Diagnosis not present

## 2018-05-24 DIAGNOSIS — N186 End stage renal disease: Secondary | ICD-10-CM | POA: Diagnosis not present

## 2018-05-24 DIAGNOSIS — D509 Iron deficiency anemia, unspecified: Secondary | ICD-10-CM | POA: Diagnosis not present

## 2018-05-24 DIAGNOSIS — N2581 Secondary hyperparathyroidism of renal origin: Secondary | ICD-10-CM | POA: Diagnosis not present

## 2018-05-25 ENCOUNTER — Ambulatory Visit (INDEPENDENT_AMBULATORY_CARE_PROVIDER_SITE_OTHER): Payer: Medicare Other | Admitting: Internal Medicine

## 2018-05-25 ENCOUNTER — Encounter: Payer: Self-pay | Admitting: Internal Medicine

## 2018-05-25 VITALS — BP 130/72 | HR 99 | Ht 68.0 in

## 2018-05-25 DIAGNOSIS — N186 End stage renal disease: Secondary | ICD-10-CM

## 2018-05-25 DIAGNOSIS — I4811 Longstanding persistent atrial fibrillation: Secondary | ICD-10-CM | POA: Diagnosis not present

## 2018-05-25 DIAGNOSIS — Z992 Dependence on renal dialysis: Secondary | ICD-10-CM | POA: Diagnosis not present

## 2018-05-25 DIAGNOSIS — I7025 Atherosclerosis of native arteries of other extremities with ulceration: Secondary | ICD-10-CM

## 2018-05-25 DIAGNOSIS — Z7901 Long term (current) use of anticoagulants: Secondary | ICD-10-CM

## 2018-05-25 DIAGNOSIS — R4189 Other symptoms and signs involving cognitive functions and awareness: Secondary | ICD-10-CM

## 2018-05-25 NOTE — Patient Instructions (Signed)
Medication Instructions:  Continue current medications If you need a refill on your cardiac medications before your next appointment, please call your pharmacy.   Follow-Up: At [facility], you and your health needs are our priority.  As part of our continuing mission to provide you with exceptional heart care, we have created designated Provider Care Teams.  These Care Teams include your primary Cardiologist (physician) and Advanced Practice Providers (APPs -  Physician Assistants and Nurse Practitioners) who all work together to provide you with the care you need, when you need it. You will need a follow up appointment in 12 months.  Please call our office 2 months in advance to schedule this appointment.  You may see Kenneth C Hilty, MD or one of the following Advanced Practice Providers on your designated Care Team: Hao Meng, PA-C . Angela Duke, PA-C  Any Other Special Instructions Will Be Listed Below (If Applicable).    

## 2018-05-25 NOTE — Progress Notes (Signed)
OFFICE CONSULT NOTE  Chief Complaint:  Follow-up afib  Primary Care Physician: Mirna Mires, MD  HPI:  Jackie [name] is a 83 y.o. female who is being seen today for the evaluation of atrial fibrillation at the request of Mirna Mires, MD. This is a pleasant 83 yo female who was recently seen by Dr. Loleta Chance.  She was referred for evaluation of new onset atrial fibrillation.  EKG showed atrial fibrillation with controlled ventricular response.  Today she is in A. fib with heart rate just over 100.  She is asymptomatic with this.  Unfortunately she has end-stage renal disease on dialysis (T/TH/SA), and additional risk factors including hyperlipidemia, hypertension, diabetes, PAD status post interventions in 2016 and is status post left BKA.  Her PCP had started her on Xarelto 10 mg daily.  We had a long discussion about the dose of this medication and the fact that Xarelto is not been studied in end-stage renal disease.  In addition the typical doses for treatment of A. fib are 15 or 20 mg.   09/01/2017  Jackie [name] was seen today in follow-up.  Over the past year she has had difficulty with her right upper arm fistula.  This is thrombosed and she is now being dialyzed through temporary catheter.  She has follow-up today with Dr. Durwin Nora.  She remains in A. fib which is long-standing persistent.  Heart rate control is very good with rates in the 80s.  She is not on any rate controlling medications.  She is on Plavix and warfarin.  This is more ideal given the fact she is on end-stage renal disease patient.  Denies any chest pain or short worsening shortness of breath.  05/25/2018  Jackie [name] returns today for follow-up.  She has been undergoing dialysis.  She is struggling with episodes of recurrent unresponsiveness during dialysis.  I suspect this is due to either hypotension or possibly hypoglycemia as she is a diabetic.  She denies any chest pain or worsening shortness of breath.  She does have a new fistula  in the left arm.  She is followed by Dr. Myra Gianotti for this.  PMHx:  Past Medical History:  Diagnosis Date  . Anemia   . Arthritis   . Diabetes mellitus   . End-stage renal disease on hemodialysis (HCC)    dialysis T/Th/Sa  . Hx of BKA, left (HCC)   . Hyperlipidemia   . Hypertension   . Pneumonia     Past Surgical History:  Procedure Laterality Date  . ABDOMINAL AORTOGRAM W/LOWER EXTREMITY N/A 12/01/2017   Procedure: ABDOMINAL AORTOGRAM W/LOWER EXTREMITY;  Surgeon: Nada Libman, MD;  [facility] INVASIVE CV LAB;  Service: Cardiovascular;  Laterality: N/A;  bilateral  . AMPUTATION Left 04/24/2015   Procedure: LEFT BELOW KNEE AMPUTATION;  Surgeon: Nadara Mustard, MD;  Location: [facility] OR;  Service: Orthopedics;  Laterality: Left;  . ARTERIOVENOUS GRAFT PLACEMENT Left    non function  . AV FISTULA PLACEMENT Right 01/30/2013   Procedure: ARTERIOVENOUS (AV) FISTULA CREATION;  Surgeon: Fransisco Hertz, MD;  Location: [facility] OR;  Service: Vascular;  Laterality: Right;  . AV FISTULA PLACEMENT Left 10/04/2017   Procedure: INSERTION OF ARTERIOVENOUS (AV) GORE-TEX GRAFT LEFT UPPER ARM;  Surgeon: Chuck Hint, MD;  Location: [facility] OR;  Service: Vascular;  Laterality: Left;  . CESAREAN SECTION    . EYE SURGERY Bilateral    catarct  . FISTULA SUPERFICIALIZATION Right 04/24/2013   Procedure: FISTULA SUPERFICIALIZATION;  Surgeon: Larina Earthly,  MD;  Location: [facility] OR;  Service: Vascular;  Laterality: Right;  . FISTULOGRAM Right 07/26/2013   Procedure: FISTULOGRAM;  Surgeon: Larina Earthly, MD;  Location: [facility] CATH LAB;  Service: Cardiovascular;  Laterality: Right;  . INSERTION OF DIALYSIS CATHETER Right 04/24/2013   Procedure: INSERTION OF DIALYSIS CATHETER;  Surgeon: Larina Earthly, MD;  Location: [facility] OR;  Service: Vascular;  Laterality: Right;  . PERIPHERAL VASCULAR ATHERECTOMY  12/01/2017   Procedure: PERIPHERAL VASCULAR ATHERECTOMY;  Surgeon: Nada Libman, MD;  [facility] INVAS[facility] CV LAB;  Service:  Cardiovascular;;  Rt. SFA  . PERIPHERAL VASCULAR CATHETERIZATION N/A 12/05/2014   Procedure: Abdominal Aortogram;  Surgeon: Fransisco Hertz, MD;  Location: [facility] INVASIVE CV LAB;  Service: Cardiovascular;  Laterality: N/A;  . PERIPHERAL VASCULAR CATHETERIZATION N/A 03/19/2015   Procedure: Lower Extremity Angiography;  Surgeon: Nada Libman, MD;  [facility] INVASIVE CV LAB;  Service: Cardiovascular;  Laterality: N/A;  . PERIPHERAL VASCULAR CATHETERIZATION  03/19/2015   Procedure: Peripheral Vascular Intervention;  Surgeon: Nada Libman, MD;  [facility] INVASIVE CV LAB;  Service: Cardiovascular;;  left sfa and popliteal stent  . TONSILLECTOMY    . UPPER EXTREMITY VENOGRAPHY N/A 09/17/2017   Procedure: UPPER EXTREMITY VENOGRAPHY;  Surgeon: Chuck Hint, MD;  Location: [facility] INVASIVE CV LAB;  Service: Cardiovascular;  Laterality: N/A;    FAMHx:  Family History  Problem Relation Age of Onset  . Diabetes Mother   . Heart disease Mother   . Hypertension Mother     SOCHx:   reports that she has never smoked. She has never used smokeless tobacco. She reports that she does not drink alcohol or use drugs.  ALLERGIES:  No Known Allergies  ROS: Pertinent items noted in HPI and remainder of comprehensive ROS otherwise negative.  HOME MEDS: Current Outpatient Medications on File Prior to Visit  Medication Sig Dispense Refill  . albuterol (PROVENTIL HFA;VENTOLIN HFA) 108 (90 BASE) MCG/ACT inhaler Inhale 1-2 puffs into the lungs every 6 (six) hours as needed for wheezing. 1 Inhaler 0  . b complex-vitamin c-folic acid (NEPHRO-VITE) 0.8 MG TABS tablet Take 1 tablet by mouth at bedtime.    . calcium acetate (PHOSLO) 667 MG capsule Take 2,001 mg by mouth 3 (three) times daily with meals.    . cinacalcet (SENSIPAR) 30 MG tablet Take 30 mg by mouth daily.    . insulin detemir (LEVEMIR) 100 UNIT/ML injection Inject 0.08 mLs (8 Units total) into the skin at bedtime. 10 mL 11  . lidocaine-prilocaine  (EMLA) cream Apply 1 application topically daily as needed (prior to dialysis).   11  . promethazine (PHENERGAN) 12.5 MG tablet Take 12.5 mg by mouth daily as needed for nausea or vomiting.     . sevelamer carbonate (RENVELA) 2.4 g PACK Take 2.4 g by mouth 3 (three) times daily with meals.     . traMADol (ULTRAM) 50 MG tablet Take 50 mg by mouth every 6 (six) hours as needed for moderate pain or severe pain.    Marland Kitchen warfarin (COUMADIN) 5 MG tablet TAKE 1 to 1.5 TABLETS BY MOUTH DAILY AS DIRECTED BY THE COUMADIN CLINIC. 45 tablet 0   No current facility-administered medications on file prior to visit.     LABS/IMAGING: No results found for this or any previous visit (from the past 48 hour(s)). No results found.  LIPID PANEL:    Component Value Date/Time   CHOL (H) 11/17/2009 0342    236  ATP III CLASSIFICATION:  <200     mg/dL   Desirable  161-096  mg/dL   Borderline High  >=045    mg/dL   High          TRIG 409 (H) 11/17/2009 0342   HDL 21 (L) 11/17/2009 0342   CHOLHDL 11.2 11/17/2009 0342   VLDL 59 (H) 11/17/2009 0342   LDLCALC (H) 11/17/2009 0342    156        Total Cholesterol/HDL:CHD Risk Coronary Heart Disease Risk Table                     Men   Women  1/2 Average Risk   3.4   3.3  Average Risk       5.0   4.4  2 X Average Risk   9.6   7.1  3 X Average Risk  23.4   11.0        Use the calculated Patient Ratio above and the CHD Risk Table to determine the patient's CHD Risk.        ATP III CLASSIFICATION (LDL):  <100     mg/dL   Optimal  811-914  mg/dL   Near or Above                    Optimal  130-159  mg/dL   Borderline  782-956  mg/dL   High  >213     mg/dL   Very High    WEIGHTS: Wt Readings from Last 3 Encounters:  05/01/18 134 lb 7.7 oz (61 kg)  04/26/18 134 lb 7.7 oz (61 kg)  04/18/18 135 lb (61.2 kg)    VITALS: BP 130/72 (BP Location: Right Arm, Patient Position: Sitting, Cuff Size: Normal)   Pulse 99   Ht 5\' 8"  (1.727 m)   BMI 20.45 kg/m    EXAM: General appearance: alert and no distress Neck: no carotid bruit, no JVD and thyroid not enlarged, symmetric, no tenderness/mass/nodules Lungs: diminished breath sounds bilaterally Heart: irregularly irregular rhythm Abdomen: soft, non-tender; bowel sounds normal; no masses,  no organomegaly Extremities: Left BKA, upper extremity fistula, positive thrill Pulses: 2+ and symmetric Skin: Skin color, texture, turgor normal. No rashes or lesions Neurologic: Grossly normal Psych: Pleasant  EKG: Deferred  ASSESSMENT: 1. Recurrent unresponsiveness at dialysis 2. Longstanding persistent atrial fibrillation - asymptomatic 3. End-stage renal disease on hemodialysis 4. Hypertension 5. Dyslipidemia 6. Type 2 diabetes 7. PAD status post left BKA  PLAN: 1.   Jackie [name] has had recurrent unresponsive episodes at dialysis.  Is not clear whether this is due to hypotension or perhaps hypoglycemia.  I am not certain that this is true syncope.  She is followed by Dr. Milana Obey who could further work this up.  She might need midodrine on dialysis days.  She has had long-standing persistent A. fib which is rate controlled.  Her fistula has a positive thrill.  No changes to her medicines today.  Follow-up annually or sooner as necessary.  Chrystie Nose, MD, [facility], FACP  [place]  [facility]  Medical Director of the Advanced Lipid Disorders &  Cardiovascular Risk Reduction Clinic Diplomate of the American Board of Clinical Lipidology Attending Cardiologist  Direct Dial: [phone]  Fax: [phone]  Website:  [email or web address] Nicely Elzy Tomasello 05/25/2018, 3:54 PM

## 2018-05-27 ENCOUNTER — Encounter: Payer: Self-pay | Admitting: Internal Medicine

## 2018-05-27 ENCOUNTER — Ambulatory Visit (INDEPENDENT_AMBULATORY_CARE_PROVIDER_SITE_OTHER): Payer: Medicare Other | Admitting: Pharmacist Clinician (PhC)/ Clinical Pharmacy Specialist

## 2018-05-27 DIAGNOSIS — I4891 Unspecified atrial fibrillation: Secondary | ICD-10-CM | POA: Diagnosis not present

## 2018-05-27 DIAGNOSIS — Z7901 Long term (current) use of anticoagulants: Secondary | ICD-10-CM | POA: Diagnosis not present

## 2018-05-27 LAB — POCT INR: INR: 3.2 — AB (ref 2.0–3.0)

## 2018-05-28 DIAGNOSIS — D509 Iron deficiency anemia, unspecified: Secondary | ICD-10-CM | POA: Diagnosis not present

## 2018-05-28 DIAGNOSIS — E1129 Type 2 diabetes mellitus with other diabetic kidney complication: Secondary | ICD-10-CM | POA: Diagnosis not present

## 2018-05-28 DIAGNOSIS — N2581 Secondary hyperparathyroidism of renal origin: Secondary | ICD-10-CM | POA: Diagnosis not present

## 2018-05-28 DIAGNOSIS — N186 End stage renal disease: Secondary | ICD-10-CM | POA: Diagnosis not present

## 2018-05-31 DIAGNOSIS — D509 Iron deficiency anemia, unspecified: Secondary | ICD-10-CM | POA: Diagnosis not present

## 2018-05-31 DIAGNOSIS — N2581 Secondary hyperparathyroidism of renal origin: Secondary | ICD-10-CM | POA: Diagnosis not present

## 2018-05-31 DIAGNOSIS — E1129 Type 2 diabetes mellitus with other diabetic kidney complication: Secondary | ICD-10-CM | POA: Diagnosis not present

## 2018-05-31 DIAGNOSIS — N186 End stage renal disease: Secondary | ICD-10-CM | POA: Diagnosis not present

## 2018-06-02 ENCOUNTER — Emergency Department (HOSPITAL_COMMUNITY)
Admission: EM | Admit: 2018-06-02 | Discharge: 2018-06-02 | Disposition: A | Discharge status: Home / Self Care | Payer: Medicare Other | Source: Home / Self Care | Attending: Emergency Medicine | Admitting: Emergency Medicine

## 2018-06-02 ENCOUNTER — Other Ambulatory Visit: Payer: Self-pay

## 2018-06-02 ENCOUNTER — Emergency Department (HOSPITAL_COMMUNITY): Payer: Medicare Other

## 2018-06-02 ENCOUNTER — Encounter (HOSPITAL_COMMUNITY): Payer: Self-pay

## 2018-06-02 DIAGNOSIS — I120 Hypertensive chronic kidney disease with stage 5 chronic kidney disease or end stage renal disease: Secondary | ICD-10-CM

## 2018-06-02 DIAGNOSIS — E1129 Type 2 diabetes mellitus with other diabetic kidney complication: Secondary | ICD-10-CM | POA: Diagnosis not present

## 2018-06-02 DIAGNOSIS — N2581 Secondary hyperparathyroidism of renal origin: Secondary | ICD-10-CM | POA: Diagnosis not present

## 2018-06-02 DIAGNOSIS — Z7901 Long term (current) use of anticoagulants: Secondary | ICD-10-CM

## 2018-06-02 DIAGNOSIS — E119 Type 2 diabetes mellitus without complications: Secondary | ICD-10-CM

## 2018-06-02 DIAGNOSIS — S3993XA Unspecified injury of pelvis, initial encounter: Secondary | ICD-10-CM | POA: Diagnosis not present

## 2018-06-02 DIAGNOSIS — M79661 Pain in right lower leg: Secondary | ICD-10-CM | POA: Diagnosis not present

## 2018-06-02 DIAGNOSIS — E43 Unspecified severe protein-calorie malnutrition: Secondary | ICD-10-CM | POA: Diagnosis not present

## 2018-06-02 DIAGNOSIS — R55 Syncope and collapse: Secondary | ICD-10-CM | POA: Diagnosis not present

## 2018-06-02 DIAGNOSIS — Z23 Encounter for immunization: Secondary | ICD-10-CM | POA: Diagnosis not present

## 2018-06-02 DIAGNOSIS — E1152 Type 2 diabetes mellitus with diabetic peripheral angiopathy with gangrene: Secondary | ICD-10-CM | POA: Diagnosis not present

## 2018-06-02 DIAGNOSIS — N186 End stage renal disease: Secondary | ICD-10-CM | POA: Insufficient documentation

## 2018-06-02 DIAGNOSIS — Z79899 Other long term (current) drug therapy: Secondary | ICD-10-CM | POA: Insufficient documentation

## 2018-06-02 DIAGNOSIS — Z992 Dependence on renal dialysis: Secondary | ICD-10-CM

## 2018-06-02 DIAGNOSIS — L97513 Non-pressure chronic ulcer of other part of right foot with necrosis of muscle: Secondary | ICD-10-CM | POA: Diagnosis not present

## 2018-06-02 DIAGNOSIS — E11621 Type 2 diabetes mellitus with foot ulcer: Secondary | ICD-10-CM | POA: Diagnosis not present

## 2018-06-02 DIAGNOSIS — I10 Essential (primary) hypertension: Secondary | ICD-10-CM | POA: Diagnosis not present

## 2018-06-02 DIAGNOSIS — J984 Other disorders of lung: Secondary | ICD-10-CM | POA: Diagnosis not present

## 2018-06-02 DIAGNOSIS — S99921A Unspecified injury of right foot, initial encounter: Secondary | ICD-10-CM | POA: Diagnosis not present

## 2018-06-02 DIAGNOSIS — R569 Unspecified convulsions: Secondary | ICD-10-CM | POA: Diagnosis not present

## 2018-06-02 DIAGNOSIS — S299XXA Unspecified injury of thorax, initial encounter: Secondary | ICD-10-CM | POA: Diagnosis not present

## 2018-06-02 DIAGNOSIS — D509 Iron deficiency anemia, unspecified: Secondary | ICD-10-CM | POA: Diagnosis not present

## 2018-06-02 LAB — TROPONIN I: Troponin I: 0.04 ng/mL (ref <0.03)

## 2018-06-02 LAB — COMPREHENSIVE METABOLIC PANEL
ALT: 17 U/L (ref 0–44)
ANION GAP: 14 (ref 5–15)
AST: 36 U/L (ref 15–41)
Albumin: 2.5 g/dL — ABNORMAL LOW (ref 3.5–5.0)
Alkaline Phosphatase: 71 U/L (ref 38–126)
BILIRUBIN TOTAL: 2.3 mg/dL — AB (ref 0.3–1.2)
BUN: 8 mg/dL (ref 8–23)
CO2: 23 mmol/L (ref 22–32)
Calcium: 8.5 mg/dL — ABNORMAL LOW (ref 8.9–10.3)
Chloride: 98 mmol/L (ref 98–111)
Creatinine, Ser: 2.68 mg/dL — ABNORMAL HIGH (ref 0.44–1.00)
GFR calc Af Amer: 18 mL/min — ABNORMAL LOW (ref >60)
GFR calc non Af Amer: 16 mL/min — ABNORMAL LOW (ref >60)
Glucose, Bld: 78 mg/dL (ref 70–99)
POTASSIUM: 3.9 mmol/L (ref 3.5–5.1)
Sodium: 135 mmol/L (ref 135–145)
TOTAL PROTEIN: 7 g/dL (ref 6.5–8.1)

## 2018-06-02 LAB — CBC WITH DIFFERENTIAL/PLATELET
Abs Immature Granulocytes: 0.05 10*3/uL (ref 0.00–0.07)
BASOS PCT: 0 %
Basophils Absolute: 0 10*3/uL (ref 0.0–0.1)
EOS ABS: 0 10*3/uL (ref 0.0–0.5)
Eosinophils Relative: 0 %
HCT: 33.8 % — ABNORMAL LOW (ref 36.0–46.0)
Hemoglobin: 10.7 g/dL — ABNORMAL LOW (ref 12.0–15.0)
IMMATURE GRANULOCYTES: 0 %
Lymphocytes Relative: 9 %
Lymphs Abs: 1 10*3/uL (ref 0.7–4.0)
MCH: 28.7 pg (ref 26.0–34.0)
MCHC: 31.7 g/dL (ref 30.0–36.0)
MCV: 90.6 fL (ref 80.0–100.0)
MONOS PCT: 8 %
Monocytes Absolute: 1 10*3/uL (ref 0.1–1.0)
NEUTROS PCT: 83 %
NRBC: 0 % (ref 0.0–0.2)
Neutro Abs: 9.5 10*3/uL — ABNORMAL HIGH (ref 1.7–7.7)
PLATELETS: 146 10*3/uL — AB (ref 150–400)
RBC: 3.73 MIL/uL — ABNORMAL LOW (ref 3.87–5.11)
RDW: 17.9 % — AB (ref 11.5–15.5)
WBC: 11.6 10*3/uL — ABNORMAL HIGH (ref 4.0–10.5)

## 2018-06-02 LAB — CBG MONITORING, ED: GLUCOSE-CAPILLARY: 73 mg/dL (ref 70–99)

## 2018-06-02 MED ORDER — LEVOFLOXACIN 500 MG PO TABS
500.0000 mg | ORAL_TABLET | Freq: Once | ORAL | Status: AC
  Administered 2018-06-02: 500 mg via ORAL
  Filled 2018-06-02: qty 1

## 2018-06-02 MED ORDER — LEVOFLOXACIN 500 MG PO TABS: 500.0000 mg | ORAL_TABLET | Freq: Every day | ORAL | 0 refills | Status: DC

## 2018-06-02 NOTE — Discharge Instructions (Signed)
As discussed, today's evaluation has been generally reassuring, but there is some suspicion for respiratory infection. Please take your antibiotics as prescribed. Please be sure to follow-up with your physician. When you follow-up with your physician, please be sure to describe the recent episodes of syncope or passing out that you have experienced, as well as the need for ongoing evaluation, including consideration of a Holter, or cardiac monitoring device.  Return here for concerning changes in your condition.

## 2018-06-02 NOTE — ED Triage Notes (Signed)
Pt brought in by EMS due to shaking during HD treatment. Pt received full treatment. Pt was able to communicate during shaking episode. Facility concerned it was seizure like activity. Pt a&ox4.

## 2018-06-02 NOTE — ED Provider Notes (Signed)
[facility] EMERGENCY DEPARTMENT Provider Note   CSN: [id number] Arrival date & time: 06/02/18  1740     History   Chief Complaint Chief Complaint  Patient presents with  . Seizures    HPI Jackie [name] is a 83 y.o. female.  HPI Patient presents after an episode of syncope. Patient was at dialysis, had a witnessed episode of syncope. Reported the patient was near the end of her treatment, completed the session. Patient herself states that she lost consciousness, but reportedly the patient was shaking, and interacting during what ever episode occurred. Currently the patient denies complaints, states that she was in her usual state of health prior to the session, and is in her usual state of health now.  Past Medical History:  Diagnosis Date  . Anemia   . Arthritis   . Diabetes mellitus   . End-stage renal disease on hemodialysis (HCC)    dialysis T/Th/Sa  . Hx of BKA, left (HCC)   . Hyperlipidemia   . Hypertension   . Pneumonia     Patient Active Problem List   Diagnosis Date Noted  . Syncope and collapse 04/26/2018  . Long term (current) use of anticoagulants [Z79.01] 07/30/2017  . Atrial fibrillation (HCC) 07/23/2017  . Medication management 07/23/2017  . Essential hypertension 07/23/2017  . Mixed hyperlipidemia 07/23/2017  . PAD (peripheral artery disease) (HCC) 07/23/2017  . S/P unilateral BKA (below knee amputation) (HCC) 04/24/2015  . 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 Leukocytosis, unspecified 04/22/2013  . HTN (hypertension), benign 04/18/2013  . Diabetes mellitus (HCC) 04/18/2013  . Syncope 04/18/2013  . Anemia 04/18/2013  . ESRD (end stage renal disease) on dialysis (HCC) 09/02/2011    Past Surgical History:  Procedure Laterality Date  . ABDOMINAL AORTOGRAM W/LOWER EXTREMITY N/A 12/01/2017   Procedure: ABDOMINAL AORTOGRAM W/LOWER EXTREMITY;  Surgeon: Nada Libman, MD;  [facility]: [facility] INVASIVE CV LAB;   Service: Cardiovascular;  Laterality: N/A;  bilateral  . AMPUTATION Left 04/24/2015   Procedure: LEFT BELOW KNEE AMPUTATION;  Surgeon: Nadara Mustard, MD;  Location: [facility] OR;  Service: Orthopedics;  Laterality: Left;  . ARTERIOVENOUS GRAFT PLACEMENT Left    non function  . AV FISTULA PLACEMENT Right 01/30/2013   Procedure: ARTERIOVENOUS (AV) FISTULA CREATION;  Surgeon: Fransisco Hertz, MD;  Location: [facility] OR;  Service: Vascular;  Laterality: Right;  . AV FISTULA PLACEMENT Left 10/04/2017   Procedure: INSERTION OF ARTERIOVENOUS (AV) GORE-TEX GRAFT LEFT UPPER ARM;  Surgeon: Chuck Hint, MD;  Location: [facility] OR;  Service: Vascular;  Laterality: Left;  . CESAREAN SECTION    . EYE SURGERY Bilateral    catarct  . FISTULA SUPERFICIALIZATION Right 04/24/2013   Procedure: FISTULA SUPERFICIALIZATION;  Surgeon: Larina Earthly, MD;  Location: [facility] OR;  Service: Vascular;  Laterality: Right;  . FISTULOGRAM Right 07/26/2013   Procedure: FISTULOGRAM;  Surgeon: Larina Earthly, MD;  Location: [facility] CATH LAB;  Service: Cardiovascular;  Laterality: Right;  . INSERTION OF DIALYSIS CATHETER Right 04/24/2013   Procedure: INSERTION OF DIALYSIS CATHETER;  Surgeon: Larina Earthly, MD;  Location: [facility] OR;  Service: Vascular;  Laterality: Right;  . PERIPHERAL VASCULAR ATHERECTOMY  12/01/2017   Procedure: PERIPHERAL VASCULAR ATHERECTOMY;  Surgeon: Nada Libman, MD;  [facility]: [facility] INVAS[facility] CV LAB;  Service: Cardiovascular;;  Rt. SFA  . PERIPHERAL VASCULAR CATHETERIZATION N/A 12/05/2014   Procedure: Abdominal Aortogram;  Surgeon: Fransisco Hertz, MD;  Location: [facility] INVASIVE CV LAB;  Service: Cardiovascular;  Laterality: N/A;  . PERIPHERAL VASCULAR CATHETERIZATION N/A 03/19/2015   Procedure: Lower Extremity Angiography;  Surgeon: Nada LibmanVance W Brabham, MD;  [facility]: [facility] INVAS[facility] CV LAB;  Service: Cardiovascular;  Laterality: N/A;  . PERIPHERAL VASCULAR CATHETERIZATION  03/19/2015   Procedure: Peripheral Vascular Intervention;  Surgeon: Nada LibmanVance W Brabham,  MD;  [facility]: [facility] INVAS[facility] CV LAB;  Service: Cardiovascular;;  left sfa and popliteal stent  . TONSILLECTOMY    . UPPER EXTREMITY VENOGRAPHY N/A 09/17/2017   Procedure: UPPER EXTREMITY VENOGRAPHY;  Surgeon: Chuck Hintickson, Christopher S, MD;  [facility];  Service: Cardiovascular;  Laterality: N/A;     OB History   None      Home Medications    Prior to Admission medications   Medication Sig Start Date End Date Taking? Authorizing Provider  albuterol (PROVENTIL HFA;VENTOLIN HFA) 108 (90 BASE) MCG/ACT inhaler Inhale 1-2 puffs into the lungs every 6 (six) hours as needed for wheezing. 12/27/12   Palumbo, April, MD  b complex-vitamin c-folic acid (NEPHRO-VITE) 0.8 MG TABS tablet Take 1 tablet by mouth at bedtime.    [provider]  calcium acetate (PHOSLO) 667 MG capsule Take 2,001 mg by mouth 3 (three) times daily with meals.    [provider]  cinacalcet (SENSIPAR) 30 MG tablet Take 30 mg by mouth daily.    [provider]  insulin detemir (LEVEMIR) 100 UNIT/ML injection Inject 0.08 mLs (8 Units total) into the skin at bedtime. 04/27/18   Joseph ArtVann, Jessica U, DO  lidocaine-prilocaine (EMLA) cream Apply 1 application topically daily as needed (prior to dialysis).  11/24/17   [provider]  promethazine (PHENERGAN) 12.5 MG tablet Take 12.5 mg by mouth daily as needed for nausea or vomiting.  07/22/17   [provider]  sevelamer carbonate (RENVELA) 2.4 g PACK Take 2.4 g by mouth 3 (three) times daily with meals.  09/06/17   [provider]  traMADol (ULTRAM) 50 MG tablet Take 50 mg by mouth every 6 (six) hours as needed for moderate pain or severe pain.    [provider]  warfarin (COUMADIN) 5 MG tablet TAKE 1 to 1.5 TABLETS BY MOUTH DAILY AS DIRECTED BY THE COUMADIN CLINIC. 04/29/18   Joseph ArtVann, Jessica U, DO    Family History Family History  Problem Relation Age of Onset  . Diabetes Mother   . Heart disease Mother   .  Hypertension Mother     Social History Social History   Tobacco Use  . Smoking status: Never Smoker  . Smokeless tobacco: Never Used  Substance Use Topics  . Alcohol use: No  . Drug use: No     Allergies   Patient has no known allergies.   Review of Systems Review of Systems  Constitutional:       Per HPI, otherwise negative  HENT:       Per HPI, otherwise negative  Respiratory:       Per HPI, otherwise negative  Cardiovascular:       Per HPI, otherwise negative  Gastrointestinal: Negative for vomiting.  Endocrine:       Negative aside from HPI  Genitourinary:       Neg aside from HPI   Musculoskeletal:       Per HPI, otherwise negative  Skin: Negative.   Allergic/Immunologic: Positive for immunocompromised state.  Neurological: Positive for syncope. Negative for seizures.     Physical Exam Updated Vital Signs BP 127/83 (BP Location: Right Arm)  Pulse (!) 120   Temp 97.7 F (36.5 C) (Oral)   Resp 20   Ht 5\' 8"  (1.727 m)   SpO2 100%   BMI 20.45 kg/m   Physical Exam  Constitutional: She is oriented to person, place, and time. She appears well-developed and well-nourished. No distress.  Elderly female awake and alert resting in left lateral decubitus position, eating something  HENT:  Head: Normocephalic and atraumatic.  Eyes: Conjunctivae and EOM are normal.  Cardiovascular: Regular rhythm.  Tachycardia  Pulmonary/Chest: Effort normal and breath sounds normal. No stridor. No respiratory distress.  Mild cough  Abdominal: She exhibits no distension.  Musculoskeletal: She exhibits no edema.  Dialysis fistula left upper extremity unremarkable  Neurological: She is alert and oriented to person, place, and time. No cranial nerve deficit.  Skin: Skin is warm and dry.  Psychiatric: She has a normal mood and affect.  Nursing note and vitals reviewed.    ED Treatments / Results  Labs (all labs ordered are listed, but only abnormal results are  displayed) Labs Reviewed  COMPREHENSIVE METABOLIC PANEL - Abnormal; Notable for the following components:      Result Value   Creatinine, Ser 2.68 (*)    Calcium 8.5 (*)    Albumin 2.5 (*)    Total Bilirubin 2.3 (*)    GFR calc non Af Amer 16 (*)    GFR calc Af Amer 18 (*)    All other components within normal limits  TROPONIN I - Abnormal; Notable for the following components:   Troponin I 0.04 (*)    All other components within normal limits  CBC WITH DIFFERENTIAL/PLATELET - Abnormal; Notable for the following components:   WBC 11.6 (*)    RBC 3.73 (*)    Hemoglobin 10.7 (*)    HCT 33.8 (*)    RDW 17.9 (*)    Platelets 146 (*)    Neutro Abs 9.5 (*)    All other components within normal limits  CBG MONITORING, ED    EKG EKG Interpretation  Date/Time:  Thursday June 02 2018 18:09:38 EST Ventricular Rate:  124 PR Interval:    QRS Duration: 88 QT Interval:  332 QTC Calculation: 476 R Axis:   108 Text Interpretation:  Atrial fibrillation No significant change since last tracing Abnormal ekg Confirmed by Gerhard Munch [phone]) on 06/02/2018 8:51:21 PM   Radiology Dg Chest 2 View  Result Date: 06/02/2018 CLINICAL DATA:  Syncope EXAM: CHEST - 2 VIEW COMPARISON:  05/21/2018 FINDINGS: Marked cardiac enlargement.  Mild vascular congestion without edema. Left lower lobe consolidation similar to the prior study compatible with atelectasis/infiltrate and small effusion. Mild right lower lobe airspace disease unchanged Right axillary vascular stent unchanged. IMPRESSION: Cardiac enlargement without edema. Bibasilar airspace disease left greater than right is unchanged. Electronically Signed   By: Marlan Palau M.D.   On: 06/02/2018 19:04    Procedures Procedures (including critical care time)  Medications Ordered in ED Medications - No data to display   Initial Impression / Assessment and Plan / ED Course  I have reviewed the triage vital signs and the nursing  notes.  Pertinent labs & imaging results that were available during my care of the patient were reviewed by me and considered in my medical decision making (see chart for details).     8:50 PM In no distress, awake, alert, has eaten, has no complaints With her son present we discussed all findings including labs that are generally consistent for her. I  reviewed the x-ray and labs, x-ray consistent going back the past 3 months, without notable changes, but, patient does acknowledge cough, has mild leukocytosis, and though she is in no distress, is breathing easily with no hypoxia, she will be started on antibiotics. Discussed importance of following up with her primary care physician, and I offered admission for monitoring as well, however, the former was the preference of the family and the patient, and she was discharged, with noted close outpatient follow-up, return precautions.    Final Clinical Impressions(s) / ED Diagnoses  Syncope   Gerhard Munch, MD 06/02/18 2104

## 2018-06-03 ENCOUNTER — Ambulatory Visit (INDEPENDENT_AMBULATORY_CARE_PROVIDER_SITE_OTHER): Payer: Medicare Other | Admitting: Pharmacist

## 2018-06-03 DIAGNOSIS — Z7901 Long term (current) use of anticoagulants: Secondary | ICD-10-CM | POA: Diagnosis not present

## 2018-06-03 DIAGNOSIS — I4891 Unspecified atrial fibrillation: Secondary | ICD-10-CM

## 2018-06-03 LAB — POCT INR: INR: 4.5 — AB (ref 2.0–3.0)

## 2018-06-04 ENCOUNTER — Inpatient Hospital Stay (HOSPITAL_COMMUNITY)
Admission: EM | Admit: 2018-06-04 | Discharge: 2018-06-11 | DRG: 239 | Disposition: A | Discharge status: Skilled Nursing Facility | Payer: Medicare Other | Attending: Internal Medicine | Admitting: Internal Medicine

## 2018-06-04 ENCOUNTER — Emergency Department (HOSPITAL_COMMUNITY): Payer: Medicare Other

## 2018-06-04 ENCOUNTER — Other Ambulatory Visit: Payer: Self-pay

## 2018-06-04 ENCOUNTER — Inpatient Hospital Stay (HOSPITAL_COMMUNITY): Payer: Medicare Other

## 2018-06-04 ENCOUNTER — Encounter (HOSPITAL_COMMUNITY): Payer: Self-pay | Admitting: Emergency Medicine

## 2018-06-04 DIAGNOSIS — I309 Acute pericarditis, unspecified: Secondary | ICD-10-CM | POA: Diagnosis present

## 2018-06-04 DIAGNOSIS — Z992 Dependence on renal dialysis: Secondary | ICD-10-CM

## 2018-06-04 DIAGNOSIS — D696 Thrombocytopenia, unspecified: Secondary | ICD-10-CM | POA: Diagnosis present

## 2018-06-04 DIAGNOSIS — E1152 Type 2 diabetes mellitus with diabetic peripheral angiopathy with gangrene: Secondary | ICD-10-CM | POA: Diagnosis not present

## 2018-06-04 DIAGNOSIS — E11649 Type 2 diabetes mellitus with hypoglycemia without coma: Secondary | ICD-10-CM | POA: Diagnosis present

## 2018-06-04 DIAGNOSIS — I959 Hypotension, unspecified: Secondary | ICD-10-CM | POA: Diagnosis present

## 2018-06-04 DIAGNOSIS — L97513 Non-pressure chronic ulcer of other part of right foot with necrosis of muscle: Secondary | ICD-10-CM

## 2018-06-04 DIAGNOSIS — N2581 Secondary hyperparathyroidism of renal origin: Secondary | ICD-10-CM | POA: Diagnosis present

## 2018-06-04 DIAGNOSIS — R296 Repeated falls: Secondary | ICD-10-CM | POA: Diagnosis present

## 2018-06-04 DIAGNOSIS — G8918 Other acute postprocedural pain: Secondary | ICD-10-CM | POA: Diagnosis not present

## 2018-06-04 DIAGNOSIS — E46 Unspecified protein-calorie malnutrition: Secondary | ICD-10-CM | POA: Diagnosis not present

## 2018-06-04 DIAGNOSIS — I96 Gangrene, not elsewhere classified: Secondary | ICD-10-CM | POA: Diagnosis not present

## 2018-06-04 DIAGNOSIS — I361 Nonrheumatic tricuspid (valve) insufficiency: Secondary | ICD-10-CM | POA: Diagnosis not present

## 2018-06-04 DIAGNOSIS — R1311 Dysphagia, oral phase: Secondary | ICD-10-CM | POA: Diagnosis not present

## 2018-06-04 DIAGNOSIS — E782 Mixed hyperlipidemia: Secondary | ICD-10-CM | POA: Diagnosis present

## 2018-06-04 DIAGNOSIS — E1122 Type 2 diabetes mellitus with diabetic chronic kidney disease: Secondary | ICD-10-CM | POA: Diagnosis present

## 2018-06-04 DIAGNOSIS — E11621 Type 2 diabetes mellitus with foot ulcer: Secondary | ICD-10-CM | POA: Diagnosis present

## 2018-06-04 DIAGNOSIS — E1140 Type 2 diabetes mellitus with diabetic neuropathy, unspecified: Secondary | ICD-10-CM | POA: Diagnosis present

## 2018-06-04 DIAGNOSIS — D631 Anemia in chronic kidney disease: Secondary | ICD-10-CM | POA: Diagnosis present

## 2018-06-04 DIAGNOSIS — N186 End stage renal disease: Secondary | ICD-10-CM | POA: Diagnosis not present

## 2018-06-04 DIAGNOSIS — Z89512 Acquired absence of left leg below knee: Secondary | ICD-10-CM

## 2018-06-04 DIAGNOSIS — Z833 Family history of diabetes mellitus: Secondary | ICD-10-CM

## 2018-06-04 DIAGNOSIS — L97515 Non-pressure chronic ulcer of other part of right foot with muscle involvement without evidence of necrosis: Secondary | ICD-10-CM | POA: Diagnosis not present

## 2018-06-04 DIAGNOSIS — N184 Chronic kidney disease, stage 4 (severe): Secondary | ICD-10-CM | POA: Diagnosis not present

## 2018-06-04 DIAGNOSIS — I32 Pericarditis in diseases classified elsewhere: Secondary | ICD-10-CM | POA: Diagnosis not present

## 2018-06-04 DIAGNOSIS — N189 Chronic kidney disease, unspecified: Secondary | ICD-10-CM | POA: Diagnosis not present

## 2018-06-04 DIAGNOSIS — Z794 Long term (current) use of insulin: Secondary | ICD-10-CM

## 2018-06-04 DIAGNOSIS — I739 Peripheral vascular disease, unspecified: Secondary | ICD-10-CM | POA: Diagnosis not present

## 2018-06-04 DIAGNOSIS — Z79899 Other long term (current) drug therapy: Secondary | ICD-10-CM | POA: Diagnosis not present

## 2018-06-04 DIAGNOSIS — I70201 Unspecified atherosclerosis of native arteries of extremities, right leg: Secondary | ICD-10-CM | POA: Diagnosis not present

## 2018-06-04 DIAGNOSIS — E119 Type 2 diabetes mellitus without complications: Secondary | ICD-10-CM

## 2018-06-04 DIAGNOSIS — R55 Syncope and collapse: Secondary | ICD-10-CM | POA: Diagnosis not present

## 2018-06-04 DIAGNOSIS — M2550 Pain in unspecified joint: Secondary | ICD-10-CM | POA: Diagnosis not present

## 2018-06-04 DIAGNOSIS — Z681 Body mass index (BMI) 19 or less, adult: Secondary | ICD-10-CM

## 2018-06-04 DIAGNOSIS — I4811 Longstanding persistent atrial fibrillation: Secondary | ICD-10-CM | POA: Diagnosis not present

## 2018-06-04 DIAGNOSIS — R5381 Other malaise: Secondary | ICD-10-CM | POA: Diagnosis not present

## 2018-06-04 DIAGNOSIS — Z7901 Long term (current) use of anticoagulants: Secondary | ICD-10-CM | POA: Diagnosis not present

## 2018-06-04 DIAGNOSIS — I120 Hypertensive chronic kidney disease with stage 5 chronic kidney disease or end stage renal disease: Secondary | ICD-10-CM | POA: Diagnosis present

## 2018-06-04 DIAGNOSIS — R29898 Other symptoms and signs involving the musculoskeletal system: Secondary | ICD-10-CM | POA: Diagnosis not present

## 2018-06-04 DIAGNOSIS — E43 Unspecified severe protein-calorie malnutrition: Secondary | ICD-10-CM | POA: Diagnosis present

## 2018-06-04 DIAGNOSIS — M6281 Muscle weakness (generalized): Secondary | ICD-10-CM | POA: Diagnosis not present

## 2018-06-04 DIAGNOSIS — M79661 Pain in right lower leg: Secondary | ICD-10-CM | POA: Diagnosis not present

## 2018-06-04 DIAGNOSIS — I10 Essential (primary) hypertension: Secondary | ICD-10-CM | POA: Diagnosis not present

## 2018-06-04 DIAGNOSIS — W19XXXA Unspecified fall, initial encounter: Secondary | ICD-10-CM | POA: Diagnosis not present

## 2018-06-04 DIAGNOSIS — I319 Disease of pericardium, unspecified: Secondary | ICD-10-CM | POA: Diagnosis not present

## 2018-06-04 DIAGNOSIS — R41841 Cognitive communication deficit: Secondary | ICD-10-CM | POA: Diagnosis not present

## 2018-06-04 DIAGNOSIS — I4891 Unspecified atrial fibrillation: Secondary | ICD-10-CM | POA: Diagnosis not present

## 2018-06-04 DIAGNOSIS — I701 Atherosclerosis of renal artery: Secondary | ICD-10-CM | POA: Diagnosis not present

## 2018-06-04 DIAGNOSIS — Z7401 Bed confinement status: Secondary | ICD-10-CM | POA: Diagnosis not present

## 2018-06-04 DIAGNOSIS — Z23 Encounter for immunization: Secondary | ICD-10-CM | POA: Diagnosis not present

## 2018-06-04 DIAGNOSIS — S99921A Unspecified injury of right foot, initial encounter: Secondary | ICD-10-CM | POA: Diagnosis not present

## 2018-06-04 DIAGNOSIS — E8889 Other specified metabolic disorders: Secondary | ICD-10-CM | POA: Diagnosis present

## 2018-06-04 DIAGNOSIS — Z01810 Encounter for preprocedural cardiovascular examination: Secondary | ICD-10-CM | POA: Diagnosis not present

## 2018-06-04 DIAGNOSIS — R2689 Other abnormalities of gait and mobility: Secondary | ICD-10-CM | POA: Diagnosis not present

## 2018-06-04 DIAGNOSIS — I2721 Secondary pulmonary arterial hypertension: Secondary | ICD-10-CM | POA: Diagnosis present

## 2018-06-04 DIAGNOSIS — S3993XA Unspecified injury of pelvis, initial encounter: Secondary | ICD-10-CM | POA: Diagnosis not present

## 2018-06-04 DIAGNOSIS — R498 Other voice and resonance disorders: Secondary | ICD-10-CM | POA: Diagnosis not present

## 2018-06-04 DIAGNOSIS — I370 Nonrheumatic pulmonary valve stenosis: Secondary | ICD-10-CM | POA: Diagnosis not present

## 2018-06-04 DIAGNOSIS — Z89519 Acquired absence of unspecified leg below knee: Secondary | ICD-10-CM

## 2018-06-04 DIAGNOSIS — S299XXA Unspecified injury of thorax, initial encounter: Secondary | ICD-10-CM | POA: Diagnosis not present

## 2018-06-04 DIAGNOSIS — Z4781 Encounter for orthopedic aftercare following surgical amputation: Secondary | ICD-10-CM | POA: Diagnosis not present

## 2018-06-04 LAB — COMPREHENSIVE METABOLIC PANEL
ALBUMIN: 2.4 g/dL — AB (ref 3.5–5.0)
ALK PHOS: 70 U/L (ref 38–126)
ALT: 17 U/L (ref 0–44)
ANION GAP: 13 (ref 5–15)
AST: 28 U/L (ref 15–41)
BUN: 28 mg/dL — ABNORMAL HIGH (ref 8–23)
CALCIUM: 9.1 mg/dL (ref 8.9–10.3)
CO2: 26 mmol/L (ref 22–32)
Chloride: 99 mmol/L (ref 98–111)
Creatinine, Ser: 5.51 mg/dL — ABNORMAL HIGH (ref 0.44–1.00)
GFR calc Af Amer: 8 mL/min — ABNORMAL LOW (ref >60)
GFR calc non Af Amer: 7 mL/min — ABNORMAL LOW (ref >60)
GLUCOSE: 139 mg/dL — AB (ref 70–99)
POTASSIUM: 3.2 mmol/L — AB (ref 3.5–5.1)
Sodium: 138 mmol/L (ref 135–145)
Total Bilirubin: 1.3 mg/dL — ABNORMAL HIGH (ref 0.3–1.2)
Total Protein: 6.6 g/dL (ref 6.5–8.1)

## 2018-06-04 LAB — CBC WITH DIFFERENTIAL/PLATELET
Abs Immature Granulocytes: 0.05 10*3/uL (ref 0.00–0.07)
BASOS ABS: 0 10*3/uL (ref 0.0–0.1)
Basophils Relative: 0 %
EOS ABS: 0 10*3/uL (ref 0.0–0.5)
EOS PCT: 0 %
HEMATOCRIT: 30.2 % — AB (ref 36.0–46.0)
HEMOGLOBIN: 9.6 g/dL — AB (ref 12.0–15.0)
Immature Granulocytes: 1 %
LYMPHS ABS: 0.6 10*3/uL — AB (ref 0.7–4.0)
LYMPHS PCT: 7 %
MCH: 28.3 pg (ref 26.0–34.0)
MCHC: 31.8 g/dL (ref 30.0–36.0)
MCV: 89.1 fL (ref 80.0–100.0)
Monocytes Absolute: 0.5 10*3/uL (ref 0.1–1.0)
Monocytes Relative: 6 %
NRBC: 0 % (ref 0.0–0.2)
Neutro Abs: 7.3 10*3/uL (ref 1.7–7.7)
Neutrophils Relative %: 86 %
Platelets: 139 10*3/uL — ABNORMAL LOW (ref 150–400)
RBC: 3.39 MIL/uL — ABNORMAL LOW (ref 3.87–5.11)
RDW: 17.7 % — AB (ref 11.5–15.5)
WBC: 8.5 10*3/uL (ref 4.0–10.5)

## 2018-06-04 LAB — GLUCOSE, CAPILLARY
GLUCOSE-CAPILLARY: 87 mg/dL (ref 70–99)
GLUCOSE-CAPILLARY: 219 mg/dL — AB (ref 70–99)
Glucose-Capillary: 69 mg/dL — ABNORMAL LOW (ref 70–99)

## 2018-06-04 LAB — PROTIME-INR
INR: 3.02
Prothrombin Time: 30.9 seconds — ABNORMAL HIGH (ref 11.4–15.2)

## 2018-06-04 LAB — MRSA PCR SCREENING: MRSA BY PCR: NEGATIVE

## 2018-06-04 MED ORDER — IOPAMIDOL (ISOVUE-370) INJECTION 76%
INTRAVENOUS | Status: AC
  Filled 2018-06-04: qty 100

## 2018-06-04 MED ORDER — INSULIN DETEMIR 100 UNIT/ML ~~LOC~~ SOLN
8.0000 [IU] | Freq: Every day | SUBCUTANEOUS | Status: DC
  Administered 2018-06-04: 8 [IU] via SUBCUTANEOUS
  Filled 2018-06-04 (×3): qty 0.08

## 2018-06-04 MED ORDER — MORPHINE SULFATE (PF) 2 MG/ML IV SOLN: 1.0000 mg | INTRAVENOUS | Status: DC | PRN

## 2018-06-04 MED ORDER — PIPERACILLIN-TAZOBACTAM 3.375 G IVPB
3.3750 g | Freq: Two times a day (BID) | INTRAVENOUS | Status: DC
  Administered 2018-06-04 – 2018-06-09 (×8): 3.375 g via INTRAVENOUS
  Filled 2018-06-04 (×12): qty 50

## 2018-06-04 MED ORDER — SEVELAMER CARBONATE 2.4 G PO PACK
2.4000 g | PACK | Freq: Three times a day (TID) | ORAL | Status: DC
  Administered 2018-06-05 – 2018-06-10 (×10): 2.4 g via ORAL
  Filled 2018-06-04 (×22): qty 1

## 2018-06-04 MED ORDER — INSULIN ASPART 100 UNIT/ML ~~LOC~~ SOLN: 0.0000 [IU] | Freq: Three times a day (TID) | SUBCUTANEOUS | Status: DC

## 2018-06-04 MED ORDER — PIPERACILLIN-TAZOBACTAM 3.375 G IVPB 30 MIN
3.3750 g | Freq: Once | INTRAVENOUS | Status: AC
  Administered 2018-06-04: 3.375 g via INTRAVENOUS
  Filled 2018-06-04: qty 50

## 2018-06-04 MED ORDER — ONDANSETRON HCL 4 MG/2ML IJ SOLN
4.0000 mg | Freq: Four times a day (QID) | INTRAMUSCULAR | Status: DC | PRN
  Administered 2018-06-10 – 2018-06-11 (×2): 4 mg via INTRAVENOUS
  Filled 2018-06-04 (×2): qty 2

## 2018-06-04 MED ORDER — FENTANYL CITRATE (PF) 100 MCG/2ML IJ SOLN
50.0000 ug | Freq: Once | INTRAMUSCULAR | Status: AC
  Administered 2018-06-04: 50 ug via INTRAVENOUS
  Filled 2018-06-04: qty 2

## 2018-06-04 MED ORDER — CALCIUM ACETATE (PHOS BINDER) 667 MG PO CAPS
2001.0000 mg | ORAL_CAPSULE | Freq: Three times a day (TID) | ORAL | Status: DC
  Administered 2018-06-05 – 2018-06-10 (×12): 2001 mg via ORAL
  Filled 2018-06-04 (×16): qty 3

## 2018-06-04 MED ORDER — ONDANSETRON HCL 4 MG PO TABS: 4.0000 mg | ORAL_TABLET | Freq: Four times a day (QID) | ORAL | Status: DC | PRN

## 2018-06-04 MED ORDER — IOPAMIDOL (ISOVUE-370) INJECTION 76%
100.0000 mL | Freq: Once | INTRAVENOUS | Status: AC | PRN
  Administered 2018-06-04: 100 mL via INTRAVENOUS

## 2018-06-04 MED ORDER — PHYTONADIONE 5 MG PO TABS
5.0000 mg | ORAL_TABLET | Freq: Once | ORAL | Status: AC
  Administered 2018-06-04: 5 mg via ORAL
  Filled 2018-06-04: qty 1

## 2018-06-04 MED ORDER — CINACALCET HCL 30 MG PO TABS
30.0000 mg | ORAL_TABLET | Freq: Every day | ORAL | Status: DC
  Administered 2018-06-05 – 2018-06-09 (×5): 30 mg via ORAL
  Filled 2018-06-04 (×7): qty 1

## 2018-06-04 MED ORDER — INSULIN ASPART 100 UNIT/ML ~~LOC~~ SOLN: 0.0000 [IU] | Freq: Every day | SUBCUTANEOUS | Status: DC

## 2018-06-04 MED ORDER — POLYETHYLENE GLYCOL 3350 17 G PO PACK: 17.0000 g | PACK | Freq: Every day | ORAL | Status: DC | PRN

## 2018-06-04 NOTE — ED Notes (Signed)
Dinner tray ordered.

## 2018-06-04 NOTE — ED Notes (Signed)
Attempted to call report

## 2018-06-04 NOTE — H&P (Addendum)
PCP:   Mirna MiresHill, Gerald, MD  Nephrologist:Dr Deterding Cardiologist: Dr   CODE STATUS: Full code  Chief Complaint: Fall   HPI: this is a c/o falling out of bed. Patient and family are poor historians.  The patient was having pains in her left leg so she requested to be sent to the ER. The pain started today after her fall. In the ER she was noted to have dry gangrene of the right lower extremity.  She denies fever, chills, nausea, vomiting or diarrhea.  Hospitalist have been asked to admit.  History provided by the patient as well as 2 sons present at bedside  The patient has known h/o PAD. Dr Myra GianottiBrabham is her vascular surgeon. At her last visit office visit her ultrasound study today suggest stenosis within the superficial femoral artery.  She underwent angiography on 12/01/2017 and had atherectomy with angioplasty of her right superficial femoral artery She has had revascularization of the right leg through treatment of the superficial femoral artery near occlusion.  She has occlusion of all 3 tibial vessels.  Per his last note she could be a candidate for posterior tibial artery revascularization  Review of Systems:  The patient denies anorexia, fever, weight loss,, vision loss, decreased hearing, hoarseness, chest pain, syncope, dyspnea on exertion, peripheral edema, balance deficits, hemoptysis, abdominal pain, melena, hematochezia, severe indigestion/heartburn, pain in right lower extremity.  hematuria, incontinence, genital sores, muscle weakness, suspicious skin lesions, transient blindness, difficulty walking, depression, unusual weight change, abnormal bleeding, enlarged lymph nodes, angioedema, and breast masses.  Past Medical History: Past Medical History:  Diagnosis Date  . Anemia   . Arthritis   . Diabetes mellitus   . End-stage renal disease on hemodialysis (HCC)    dialysis T/Th/Sa  . Hx of BKA, left (HCC)   . Hyperlipidemia   . Hypertension   . Pneumonia    Past Surgical  History:  Procedure Laterality Date  . ABDOMINAL AORTOGRAM W/LOWER EXTREMITY N/A 12/01/2017   Procedure: ABDOMINAL AORTOGRAM W/LOWER EXTREMITY;  Surgeon: Nada LibmanBrabham, Vance W, MD;  [facility]: [facility] INVAS[facility] CV LAB;  Service: Cardiovascular;  Laterality: N/A;  bilateral  . AMPUTATION Left 04/24/2015   Procedure: LEFT BELOW KNEE AMPUTATION;  Surgeon: Nadara MustardMarcus Duda V, MD;  Location: [facility] OR;  Service: Orthopedics;  Laterality: Left;  . ARTERIOVENOUS GRAFT PLACEMENT Left    non function  . AV FISTULA PLACEMENT Right 01/30/2013   Procedure: ARTERIOVENOUS (AV) FISTULA CREATION;  Surgeon: Fransisco HertzBrian L Chen, MD;  [facility];  Service: Vascular;  Laterality: Right;  . AV FISTULA PLACEMENT Left 10/04/2017   Procedure: INSERTION OF ARTERIOVENOUS (AV) GORE-TEX GRAFT LEFT UPPER ARM;  Surgeon: Chuck Hintickson, Christopher S, MD;  [facility];  Service: Vascular;  Laterality: Left;  . CESAREAN SECTION    . EYE SURGERY Bilateral    catarct  . FISTULA SUPERFICIALIZATION Right 04/24/2013   Procedure: FISTULA SUPERFICIALIZATION;  Surgeon: Larina Earthlyodd F Early, MD;  [facility];  Service: Vascular;  Laterality: Right;  . FISTULOGRAM Right 07/26/2013   Procedure: FISTULOGRAM;  Surgeon: Larina Earthlyodd F Early, MD;  [facility];  Service: Cardiovascular;  Laterality: Right;  . INSERTION OF DIALYSIS CATHETER Right 04/24/2013   Procedure: INSERTION OF DIALYSIS CATHETER;  Surgeon: Larina Earthlyodd F Early, MD;  [facility];  Service: Vascular;  Laterality: Right;  . PERIPHERAL VASCULAR ATHERECTOMY  12/01/2017   Procedure: PERIPHERAL VASCULAR ATHERECTOMY;  Surgeon: Nada LibmanBrabham, Vance W, MD;  [facility];  Service: Cardiovascular;;  Rt. SFA  .  PERIPHERAL VASCULAR CATHETERIZATION N/A 12/05/2014   Procedure: Abdominal Aortogram;  Surgeon: Fransisco Hertz, MD;  Location: [facility] INVASIVE CV LAB;  Service: Cardiovascular;  Laterality: N/A;  . PERIPHERAL VASCULAR CATHETERIZATION N/A 03/19/2015   Procedure: Lower Extremity Angiography;  Surgeon: Nada Libman, MD;  [facility]: [facility] INVAS[facility] CV LAB;  Service: Cardiovascular;  Laterality: N/A;  . PERIPHERAL VASCULAR CATHETERIZATION  03/19/2015   Procedure: Peripheral Vascular Intervention;  Surgeon: Nada Libman, MD;  [facility]: [facility] INVAS[facility] CV LAB;  Service: Cardiovascular;;  left sfa and popliteal stent  . TONSILLECTOMY    . UPPER EXTREMITY VENOGRAPHY N/A 09/17/2017   Procedure: UPPER EXTREMITY VENOGRAPHY;  Surgeon: Chuck Hint, MD;  Location: [facility] INVASIVE CV LAB;  Service: Cardiovascular;  Laterality: N/A;    Medications: Prior to Admission medications   Medication Sig Start Date End Date Taking? Authorizing Provider  albuterol (PROVENTIL HFA;VENTOLIN HFA) 108 (90 BASE) MCG/ACT inhaler Inhale 1-2 puffs into the lungs every 6 (six) hours as needed for wheezing. 12/27/12  Yes Palumbo, April, MD  b complex-vitamin c-folic acid (NEPHRO-VITE) 0.8 MG TABS tablet Take 1 tablet by mouth at bedtime.   Yes [provider]  calcium acetate (PHOSLO) 667 MG capsule Take 2,001 mg by mouth 3 (three) times daily with meals.   Yes [provider]  cinacalcet (SENSIPAR) 30 MG tablet Take 30 mg by mouth daily.   Yes [provider]  guaiFENesin (ROBITUSSIN) 100 MG/5ML SOLN Take 15 mLs by mouth every 4 (four) hours as needed for cough or to loosen phlegm.   Yes [provider]  insulin detemir (LEVEMIR) 100 UNIT/ML injection Inject 0.08 mLs (8 Units total) into the skin at bedtime. 04/27/18  Yes Vann, Jessica U, DO  levofloxacin (LEVAQUIN) 500 MG tablet Take 1 tablet (500 mg total) by mouth daily. 06/02/18  Yes Gerhard Munch, MD  lidocaine-prilocaine (EMLA) cream Apply 1 application topically daily as needed (prior to dialysis).  11/24/17  Yes [provider]  promethazine (PHENERGAN) 12.5 MG tablet Take 12.5 mg by mouth daily as needed for nausea or vomiting.  07/22/17  Yes [provider]  sevelamer carbonate (RENVELA) 2.4 g PACK Take 2.4 g by mouth 3  (three) times daily with meals.  09/06/17  Yes [provider]  traMADol (ULTRAM) 50 MG tablet Take 50 mg by mouth every 6 (six) hours as needed for moderate pain or severe pain.   Yes [provider]  warfarin (COUMADIN) 5 MG tablet TAKE 1 to 1.5 TABLETS BY MOUTH DAILY AS DIRECTED BY THE COUMADIN CLINIC. Patient taking differently: Take 5 mg by mouth daily.  04/29/18  Yes Joseph Art, DO    Allergies:  No Known Allergies  Social History:  reports that she has never smoked. She has never used smokeless tobacco. She reports that she does not drink alcohol or use drugs.  Family History: Family History  Problem Relation Age of Onset  . Diabetes Mother   . Heart disease Mother   . Hypertension Mother     Physical Exam: Vitals:   06/04/18 1045 06/04/18 1100 06/04/18 1130 06/04/18 1230  BP: 137/84 (!) 130/94 107/74 127/76  Pulse: (!) 110 (!) 121 (!) 111 (!) 109  Resp:   (!) 23 14  Temp:      TempSrc:      SpO2: 100% 98% 97% 93%    General:  Alert and oriented times three, well developed and nourished, no acute distress Eyes: PERRLA, pink conjunctiva, no scleral  icterus ENT: Moist oral mucosa, neck supple, no thyromegaly Lungs: clear to ascultation, no wheeze, no crackles, no use of accessory muscles Cardiovascular: regular rate and rhythm, no regurgitation, no gallops, no murmurs. No carotid bruits, no JVD Abdomen: soft, positive BS, non-tender, non-distended, no organomegaly, not an acute abdomen GU: not examined Neuro: CN II - XII grossly intact, sensation intact Musculoskeletal: strength 5/5 upper extremities, no clubbing, cyanosis or edema, left BKA, dry gangrene right lower extremity up to mid calf.  Foul-smelling.  No palpable pulses Skin: no rash, no subcutaneous crepitation, no decubitus Psych: appropriate patient   Labs on Admission:  Recent Labs    06/02/18 1905 06/04/18 1055  NA 135 138  K 3.9 3.2*  CL 98 99  CO2 23 26  GLUCOSE 78 139*   BUN 8 28*  CREATININE 2.68* 5.51*  CALCIUM 8.5* 9.1   Recent Labs    06/02/18 1905 06/04/18 1055  AST 36 28  ALT 17 17  ALKPHOS 71 70  BILITOT 2.3* 1.3*  PROT 7.0 6.6  ALBUMIN 2.5* 2.4*   No results for input(s): LIPASE, AMYLASE in the last 72 hours. Recent Labs    06/02/18 1905 06/04/18 1055  WBC 11.6* 8.5  NEUTROABS 9.5* 7.3  HGB 10.7* 9.6*  HCT 33.8* 30.2*  MCV 90.6 89.1  PLT 146* 139*   Recent Labs    06/02/18 1905  TROPONINI 0.04*   Invalid input(s): POCBNP No results for input(s): DDIMER in the last 72 hours. No results for input(s): HGBA1C in the last 72 hours. No results for input(s): CHOL, HDL, LDLCALC, TRIG, CHOLHDL, LDLDIRECT in the last 72 hours. No results for input(s): TSH, T4TOTAL, T3FREE, THYROIDAB in the last 72 hours.  Invalid input(s): FREET3 No results for input(s): VITAMINB12, FOLATE, FERRITIN, TIBC, IRON, RETICCTPCT in the last 72 hours.  Micro Results: No results found for this or any previous visit (from the past 240 hour(s)).   Radiological Exams on Admission: Dg Chest 2 View  Result Date: 06/04/2018 CLINICAL DATA:  right leg pain onset last night, also reports new blisters on right foot, reports a "couple falls last night when moving from the wheelchair to the bed." dialysis patient, on T/Th/S, did not receive treatment today. hx ESRD, htn, BKA left side. Hx DM. Pt also reports right sided hip pain. EXAM: CHEST - 2 VIEW COMPARISON:  06/02/2018 FINDINGS: Airspace opacities at the left lung base obscuring the left diaphragmatic leaflet. Mild interstitial prominence diffusely. Marked cardiomegaly.  Aortic Atherosclerosis (ICD10-170.0). No significant effusion. Visualized bones unremarkable. Vascular stents in the right subclavian and axillary region as before. IMPRESSION: 1.  Persistent airspace opacities at the left lung base. 2. Stable cardiomegaly. Electronically Signed   By: Corlis Leak M.D.   On: 06/04/2018 12:17   Dg Chest 2  View  Result Date: 06/02/2018 CLINICAL DATA:  Syncope EXAM: CHEST - 2 VIEW COMPARISON:  05/21/2018 FINDINGS: Marked cardiac enlargement.  Mild vascular congestion without edema. Left lower lobe consolidation similar to the prior study compatible with atelectasis/infiltrate and small effusion. Mild right lower lobe airspace disease unchanged Right axillary vascular stent unchanged. IMPRESSION: Cardiac enlargement without edema. Bibasilar airspace disease left greater than right is unchanged. Electronically Signed   By: Marlan Palau M.D.   On: 06/02/2018 19:04   Dg Pelvis 1-2 Views  Result Date: 06/04/2018 CLINICAL DATA:  right leg pain onset last night, also reports new blisters on right foot, reports a "couple falls last night when moving from the wheelchair to  the bed." dialysis patient, on T/Th/S, did not receive treatment today. hx ESRD, htn, BKA left side. Hx DM. Pt also reports right sided hip pain. EXAM: PELVIS - 1-2 VIEW COMPARISON:  CT 04/20/2013 and previous FINDINGS: There is no evidence of pelvic fracture or diastasis. No pelvic bone lesions are seen. Extensive iliofemoral and pelvic arterial calcifications. IMPRESSION: No acute findings. Electronically Signed   By: Corlis Leak M.D.   On: 06/04/2018 12:18   Dg Foot Complete Right  Result Date: 06/04/2018 CLINICAL DATA:  right leg pain onset last night, also reports new blisters on right foot, reports a "couple falls last night when moving from the wheelchair to the bed." dialysis patient, on T/Th/S, did not receive treatment today. hx ESRD, htn, BKA left side. Hx DM. Pt also reports right sided hip pain. EXAM: RIGHT FOOT COMPLETE - 3+ VIEW COMPARISON:  11/09/2017 FINDINGS: Diffuse osteopenia. Negative for fracture or dislocation. Extensive vascular calcifications. No radiodense foreign body. No subcutaneous gas. IMPRESSION: Osteopenia without acute finding. Electronically Signed   By: Corlis Leak M.D.   On: 06/04/2018 12:15     Assessment/Plan Present on Admission: . dry gangrene right lower extremity (HCC) -Admit to med telemetry -Consult orthopedics Dr. Lajoyce Corners placed by ED physician -Consult cardiology for cardiac clearance -Pain medications as needed -Angio with runoff to evaluate circulation healing potential  . Atrial fibrillation (HCC) -DC Coumadin. Vitamin K ordered -Other home meds resumed  . ESRD -Consult nephrology for hemodialysis  . PAD (peripheral artery disease) (HCC) -See above  Lief Palmatier 06/04/2018, 1:10 PM

## 2018-06-04 NOTE — ED Notes (Signed)
Right foot noted to be cold and discolored without doppler-able pulse.

## 2018-06-04 NOTE — ED Notes (Signed)
Transported to CT 

## 2018-06-04 NOTE — ED Triage Notes (Signed)
Pt to ER for one day history of right leg pain onset last night, reports a "couple falls last night when moving from the wheelchair to the bed." dialysis patient, on T/Th/S, did not receive treatment today.

## 2018-06-04 NOTE — ED Provider Notes (Signed)
[facility] EMERGENCY DEPARTMENT Provider Note   CSN: [id number] Arrival date & time: 06/04/18  1039     History   Chief Complaint Chief Complaint  Patient presents with  . Leg Pain    HPI Jackie [name] is a 83 y.o. female.  The history is provided by the patient. No language interpreter was used.  Leg Pain   This is a new problem. The current episode started 6 to 12 hours ago. The problem occurs constantly. The problem has been gradually worsening. The pain is present in the right lower leg. The quality of the pain is described as aching. The pain is severe. Pertinent negatives include no numbness. She has tried nothing for the symptoms. There has been a history of trauma.  Pt is reported to have fell today when transferring from bed to wheelchair.  Pt complains of severe pain in her right leg.  Pt was suppose to go to dialysis but was unable due to pain  Pt has dialysis at [address]. T,TH, Sat Past Medical History:  Diagnosis Date  . Anemia   . Arthritis   . Diabetes mellitus   . End-stage renal disease on hemodialysis (HCC)    dialysis T/Th/Sa  . Hx of BKA, left (HCC)   . Hyperlipidemia   . Hypertension   . Pneumonia     Patient Active Problem List   Diagnosis Date Noted  . Syncope and collapse 04/26/2018  . Long term (current) use of anticoagulants [Z79.01] 07/30/2017  . Atrial fibrillation (HCC) 07/23/2017  . Medication management 07/23/2017  . Essential hypertension 07/23/2017  . Mixed hyperlipidemia 07/23/2017  . PAD (peripheral artery disease) (HCC) 07/23/2017  . S/P unilateral BKA (below knee amputation) (HCC) 04/24/2015  . 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 Leukocytosis, unspecified 04/22/2013  . HTN (hypertension), benign 04/18/2013  . Diabetes mellitus (HCC) 04/18/2013  . Syncope 04/18/2013  . Anemia 04/18/2013  . ESRD (end stage renal disease) on dialysis (HCC) 09/02/2011    Past Surgical History:    Procedure Laterality Date  . ABDOMINAL AORTOGRAM W/LOWER EXTREMITY N/A 12/01/2017   Procedure: ABDOMINAL AORTOGRAM W/LOWER EXTREMITY;  Surgeon: Nada Libman, MD;  Location: [facility] INVASIVE CV LAB;  Service: Cardiovascular;  Laterality: N/A;  bilateral  . AMPUTATION Left 04/24/2015   Procedure: LEFT BELOW KNEE AMPUTATION;  Surgeon: Nadara Mustard, MD;  Location: [facility] OR;  Service: Orthopedics;  Laterality: Left;  . ARTERIOVENOUS GRAFT PLACEMENT Left    non function  . AV FISTULA PLACEMENT Right 01/30/2013   Procedure: ARTERIOVENOUS (AV) FISTULA CREATION;  Surgeon: Fransisco Hertz, MD;  Location: [facility] OR;  Service: Vascular;  Laterality: Right;  . AV FISTULA PLACEMENT Left 10/04/2017   Procedure: INSERTION OF ARTERIOVENOUS (AV) GORE-TEX GRAFT LEFT UPPER ARM;  Surgeon: Chuck Hint, MD;  Location: [facility] OR;  Service: Vascular;  Laterality: Left;  . CESAREAN SECTION    . EYE SURGERY Bilateral    catarct  . FISTULA SUPERFICIALIZATION Right 04/24/2013   Procedure: FISTULA SUPERFICIALIZATION;  Surgeon: Larina Earthly, MD;  Location: [facility] OR;  Service: Vascular;  Laterality: Right;  . FISTULOGRAM Right 07/26/2013   Procedure: FISTULOGRAM;  Surgeon: Larina Earthly, MD;  Location: [facility] CATH LAB;  Service: Cardiovascular;  Laterality: Right;  . INSERTION OF DIALYSIS CATHETER Right 04/24/2013   Procedure: INSERTION OF DIALYSIS CATHETER;  Surgeon: Larina Earthly, MD;  Location: [facility] OR;  Service: Vascular;  Laterality: Right;  . PERIPHERAL VASCULAR ATHERECTOMY  12/01/2017   Procedure: PERIPHERAL VASCULAR ATHERECTOMY;  Surgeon: Nada Libman, MD;  Location: [facility] INVASIVE CV LAB;  Service: Cardiovascular;;  Rt. SFA  . PERIPHERAL VASCULAR CATHETERIZATION N/A 12/05/2014   Procedure: Abdominal Aortogram;  Surgeon: Fransisco Hertz, MD;  Location: [facility] INVASIVE CV LAB;  Service: Cardiovascular;  Laterality: N/A;  . PERIPHERAL VASCULAR CATHETERIZATION N/A 03/19/2015   Procedure: Lower Extremity Angiography;  Surgeon: Nada Libman, MD;   Location: [facility] INVASIVE CV LAB;  Service: Cardiovascular;  Laterality: N/A;  . PERIPHERAL VASCULAR CATHETERIZATION  03/19/2015   Procedure: Peripheral Vascular Intervention;  Surgeon: Nada Libman, MD;  Location: [facility] INVASIVE CV LAB;  Service: Cardiovascular;;  left sfa and popliteal stent  . TONSILLECTOMY    . UPPER EXTREMITY VENOGRAPHY N/A 09/17/2017   Procedure: UPPER EXTREMITY VENOGRAPHY;  Surgeon: Chuck Hint, MD;  Location: [facility] INVASIVE CV LAB;  Service: Cardiovascular;  Laterality: N/A;     OB History   None      Home Medications    Prior to Admission medications   Medication Sig Start Date End Date Taking? Authorizing Provider  albuterol (PROVENTIL HFA;VENTOLIN HFA) 108 (90 BASE) MCG/ACT inhaler Inhale 1-2 puffs into the lungs every 6 (six) hours as needed for wheezing. 12/27/12   Palumbo, April, MD  b complex-vitamin c-folic acid (NEPHRO-VITE) 0.8 MG TABS tablet Take 1 tablet by mouth at bedtime.    [provider]  calcium acetate (PHOSLO) 667 MG capsule Take 2,001 mg by mouth 3 (three) times daily with meals.    [provider]  cinacalcet (SENSIPAR) 30 MG tablet Take 30 mg by mouth daily.    [provider]  insulin detemir (LEVEMIR) 100 UNIT/ML injection Inject 0.08 mLs (8 Units total) into the skin at bedtime. 04/27/18   Joseph Art, DO  levofloxacin (LEVAQUIN) 500 MG tablet Take 1 tablet (500 mg total) by mouth daily. 06/02/18   Gerhard Munch, MD  lidocaine-prilocaine (EMLA) cream Apply 1 application topically daily as needed (prior to dialysis).  11/24/17   [provider]  promethazine (PHENERGAN) 12.5 MG tablet Take 12.5 mg by mouth daily as needed for nausea or vomiting.  07/22/17   [provider]  sevelamer carbonate (RENVELA) 2.4 g PACK Take 2.4 g by mouth 3 (three) times daily with meals.  09/06/17   [provider]  traMADol (ULTRAM) 50 MG tablet Take 50 mg by mouth every 6 (six) hours as needed for  moderate pain or severe pain.    [provider]  warfarin (COUMADIN) 5 MG tablet TAKE 1 to 1.5 TABLETS BY MOUTH DAILY AS DIRECTED BY THE COUMADIN CLINIC. 04/29/18   Joseph Art, DO    Family History Family History  Problem Relation Age of Onset  . Diabetes Mother   . Heart disease Mother   . Hypertension Mother     Social History Social History   Tobacco Use  . Smoking status: Never Smoker  . Smokeless tobacco: Never Used  Substance Use Topics  . Alcohol use: No  . Drug use: No     Allergies   Patient has no known allergies.   Review of Systems Review of Systems  Neurological: Negative for numbness.  All other systems reviewed and are negative.    Physical Exam Updated Vital Signs BP 112/84 (BP Location: Right Arm)   Pulse (!) 108   Temp 98.1 F (36.7 C) (Oral)   Resp 18   SpO2 100%   Physical Exam  Constitutional:  She is oriented to person, place, and time. She appears well-developed and well-nourished.  HENT:  Head: Normocephalic.  Eyes: EOM are normal.  Neck: Normal range of motion.  Cardiovascular: Normal rate.  Pulmonary/Chest: Effort normal.  Abdominal: She exhibits no distension.  Musculoskeletal:  Left bka, right foot, cold, dark foul smelling, pulseless, cold to mid shin   Neurological: She is alert and oriented to person, place, and time.  Psychiatric: She has a normal mood and affect.  Nursing note and vitals reviewed.    ED Treatments / Results  Labs (all labs ordered are listed, but only abnormal results are displayed) Labs Reviewed  CULTURE, BLOOD (ROUTINE X 2)  CULTURE, BLOOD (ROUTINE X 2)  CBC WITH DIFFERENTIAL/PLATELET  COMPREHENSIVE METABOLIC PANEL  PROTIME-INR    EKG None  Radiology Dg Chest 2 View  Result Date: 06/02/2018 CLINICAL DATA:  Syncope EXAM: CHEST - 2 VIEW COMPARISON:  05/21/2018 FINDINGS: Marked cardiac enlargement.  Mild vascular congestion without edema. Left lower lobe consolidation similar  to the prior study compatible with atelectasis/infiltrate and small effusion. Mild right lower lobe airspace disease unchanged Right axillary vascular stent unchanged. IMPRESSION: Cardiac enlargement without edema. Bibasilar airspace disease left greater than right is unchanged. Electronically Signed   By: Marlan Palauharles  Clark M.D.   On: 06/02/2018 19:04    Procedures Procedures (including critical care time)  Medications Ordered in ED Medications  fentaNYL (SUBLIMAZE) injection 50 mcg (has no administration in time range)     Initial Impression / Assessment and Plan / ED Course  I have reviewed the triage vital signs and the nursing notes.  Pertinent labs & imaging results that were available during my care of the patient were reviewed by me and considered in my medical decision making (see chart for details).     MDM  Pt given IV fentanyl.  Labs and xrays ordered.  Old charts reviewed.  Pt noted to have superficial wounds on right foot,  Pt had left leg amputated below the knee 10/16. Pt has been followed since by Dr. Lajoyce Cornersuda.  [facility]  Hospitalist will admit.  I spoke to Dr. Roda ShuttersXu Orthopaedist on call.  He reports they will consult.  He reports pt will need to be seen by Dr. Lajoyce Cornersuda on Monday  Final Clinical Impressions(s) / ED Diagnoses   Final diagnoses:  Right foot ulcer, with necrosis of muscle [facility](HCC)    ED Discharge Orders    None       Osie CheeksSofia,  K, PA-C 06/04/18 1352    Shaune PollackIsaacs, Cameron, MD 06/05/18 [phone]

## 2018-06-04 NOTE — ED Notes (Signed)
Pt arrived to Rm 54 via stretcher. Monitor intact to pt. Pt alert, oriented, calm, cooperative. Restricted extremity arm band noted to left wrist. Son at bedside.

## 2018-06-05 DIAGNOSIS — I739 Peripheral vascular disease, unspecified: Secondary | ICD-10-CM

## 2018-06-05 DIAGNOSIS — E1122 Type 2 diabetes mellitus with diabetic chronic kidney disease: Secondary | ICD-10-CM

## 2018-06-05 DIAGNOSIS — L97513 Non-pressure chronic ulcer of other part of right foot with necrosis of muscle: Secondary | ICD-10-CM

## 2018-06-05 DIAGNOSIS — Z794 Long term (current) use of insulin: Secondary | ICD-10-CM

## 2018-06-05 DIAGNOSIS — N186 End stage renal disease: Secondary | ICD-10-CM

## 2018-06-05 DIAGNOSIS — I96 Gangrene, not elsewhere classified: Secondary | ICD-10-CM

## 2018-06-05 DIAGNOSIS — N184 Chronic kidney disease, stage 4 (severe): Secondary | ICD-10-CM

## 2018-06-05 LAB — BASIC METABOLIC PANEL
Anion gap: 15 (ref 5–15)
BUN: 37 mg/dL — ABNORMAL HIGH (ref 8–23)
CALCIUM: 9 mg/dL (ref 8.9–10.3)
CHLORIDE: 100 mmol/L (ref 98–111)
CO2: 23 mmol/L (ref 22–32)
CREATININE: 6.51 mg/dL — AB (ref 0.44–1.00)
GFR calc non Af Amer: 5 mL/min — ABNORMAL LOW (ref >60)
GFR, EST AFRICAN AMERICAN: 6 mL/min — AB (ref >60)
Glucose, Bld: 132 mg/dL — ABNORMAL HIGH (ref 70–99)
Potassium: 3.3 mmol/L — ABNORMAL LOW (ref 3.5–5.1)
SODIUM: 138 mmol/L (ref 135–145)

## 2018-06-05 LAB — GLUCOSE, CAPILLARY
GLUCOSE-CAPILLARY: 193 mg/dL — AB (ref 70–99)
GLUCOSE-CAPILLARY: 72 mg/dL (ref 70–99)
GLUCOSE-CAPILLARY: 167 mg/dL — AB (ref 70–99)
GLUCOSE-CAPILLARY: 147 mg/dL — AB (ref 70–99)
GLUCOSE-CAPILLARY: 112 mg/dL — AB (ref 70–99)
Glucose-Capillary: 48 mg/dL — ABNORMAL LOW (ref 70–99)
Glucose-Capillary: 103 mg/dL — ABNORMAL HIGH (ref 70–99)
Glucose-Capillary: 56 mg/dL — ABNORMAL LOW (ref 70–99)
Glucose-Capillary: 167 mg/dL — ABNORMAL HIGH (ref 70–99)
Glucose-Capillary: 133 mg/dL — ABNORMAL HIGH (ref 70–99)

## 2018-06-05 LAB — CBC
HEMATOCRIT: 28 % — AB (ref 36.0–46.0)
Hemoglobin: 8.8 g/dL — ABNORMAL LOW (ref 12.0–15.0)
MCH: 27.7 pg (ref 26.0–34.0)
MCHC: 31.4 g/dL (ref 30.0–36.0)
MCV: 88.1 fL (ref 80.0–100.0)
PLATELETS: 137 10*3/uL — AB (ref 150–400)
RBC: 3.18 MIL/uL — AB (ref 3.87–5.11)
RDW: 17.3 % — AB (ref 11.5–15.5)
WBC: 8.3 10*3/uL (ref 4.0–10.5)
nRBC: 0 % (ref 0.0–0.2)

## 2018-06-05 LAB — PROTIME-INR
INR: 2.87
INR: 2.33
Prothrombin Time: 29.7 seconds — ABNORMAL HIGH (ref 11.4–15.2)
Prothrombin Time: 25.3 seconds — ABNORMAL HIGH (ref 11.4–15.2)

## 2018-06-05 MED ORDER — DEXTROSE 50 % IV SOLN
INTRAVENOUS | Status: AC
  Filled 2018-06-05: qty 50

## 2018-06-05 MED ORDER — DEXTROSE 50 % IV SOLN
1.0000 | Freq: Once | INTRAVENOUS | Status: AC
  Administered 2018-06-05: 50 mL via INTRAVENOUS
  Filled 2018-06-05: qty 50

## 2018-06-05 MED ORDER — POTASSIUM CHLORIDE CRYS ER 20 MEQ PO TBCR
20.0000 meq | EXTENDED_RELEASE_TABLET | Freq: Once | ORAL | Status: AC
  Administered 2018-06-05: 20 meq via ORAL
  Filled 2018-06-05: qty 1

## 2018-06-05 MED ORDER — DARBEPOETIN ALFA 100 MCG/0.5ML IJ SOSY
100.0000 ug | PREFILLED_SYRINGE | INTRAMUSCULAR | Status: AC
  Administered 2018-06-06: 100 ug via INTRAVENOUS

## 2018-06-05 MED ORDER — PNEUMOCOCCAL VAC POLYVALENT 25 MCG/0.5ML IJ INJ
0.5000 mL | INJECTION | INTRAMUSCULAR | Status: AC
  Administered 2018-06-06: 0.5 mL via INTRAMUSCULAR
  Filled 2018-06-05: qty 0.5

## 2018-06-05 MED ORDER — DEXTROSE 10 % IV SOLN
INTRAVENOUS | Status: DC
  Administered 2018-06-05: 11:00:00 via INTRAVENOUS

## 2018-06-05 MED ORDER — DARBEPOETIN ALFA 100 MCG/0.5ML IJ SOSY: 100.0000 ug | PREFILLED_SYRINGE | INTRAMUSCULAR | Status: DC

## 2018-06-05 MED ORDER — CALCITRIOL 0.25 MCG PO CAPS
2.0000 ug | ORAL_CAPSULE | ORAL | Status: DC
  Administered 2018-06-07 – 2018-06-11 (×3): 2 ug via ORAL
  Filled 2018-06-05: qty 8

## 2018-06-05 NOTE — H&P (View-Only) (Signed)
  ORTHOPAEDIC CONSULTATION  REQUESTING PHYSICIAN: Ogbata, Sylvester I, MD  Chief Complaint: Painful dry gangrene right foot  HPI: Jackie [name] is a 83 y.o. female who presents with multiple 5 dry gangrene of the right foot.  Patient is status post a left transtibial amputation which has healed well she does have a prosthesis from [employer].  Patient's left transtibial amputation was 3 years ago.  Patient has been followed by Dr. Brabham for revascularization procedures and has been followed by podiatry for wound care.  Patient has diabetes end-stage renal disease on dialysis Tuesday Thursday Saturday.  Past Medical History:  Diagnosis Date  . Anemia   . Arthritis   . Diabetes mellitus   . End-stage renal disease on hemodialysis (HCC)    dialysis T/Th/Sa  . Hx of BKA, left (HCC)   . Hyperlipidemia   . Hypertension   . Pneumonia    Past Surgical History:  Procedure Laterality Date  . ABDOMINAL AORTOGRAM W/LOWER EXTREMITY N/A 12/01/2017   Procedure: ABDOMINAL AORTOGRAM W/LOWER EXTREMITY;  Surgeon: Brabham, Vance W, MD;  Location: [facility];  Service: Cardiovascular;  Laterality: N/A;  bilateral  . AMPUTATION Left 04/24/2015   Procedure: LEFT BELOW KNEE AMPUTATION;  Surgeon: Marcus Duda V, MD;  Location: [facility];  Service: Orthopedics;  Laterality: Left;  . ARTERIOVENOUS GRAFT PLACEMENT Left    non function  . AV FISTULA PLACEMENT Right 01/30/2013   Procedure: ARTERIOVENOUS (AV) FISTULA CREATION;  Surgeon: Brian L Chen, MD;  Location: [facility];  Service: Vascular;  Laterality: Right;  . AV FISTULA PLACEMENT Left 10/04/2017   Procedure: INSERTION OF ARTERIOVENOUS (AV) GORE-TEX GRAFT LEFT UPPER ARM;  Surgeon: Dickson, Christopher S, MD;  Location: [facility];  Service: Vascular;  Laterality: Left;  . CESAREAN SECTION    . EYE SURGERY Bilateral    catarct  . FISTULA SUPERFICIALIZATION Right 04/24/2013   Procedure: FISTULA SUPERFICIALIZATION;  Surgeon: Todd F Early, MD;  Location: [facility];   Service: Vascular;  Laterality: Right;  . FISTULOGRAM Right 07/26/2013   Procedure: FISTULOGRAM;  Surgeon: Todd F Early, MD;  Location: [facility];  Service: Cardiovascular;  Laterality: Right;  . INSERTION OF DIALYSIS CATHETER Right 04/24/2013   Procedure: INSERTION OF DIALYSIS CATHETER;  Surgeon: Todd F Early, MD;  Location: [facility];  Service: Vascular;  Laterality: Right;  . PERIPHERAL VASCULAR ATHERECTOMY  12/01/2017   Procedure: PERIPHERAL VASCULAR ATHERECTOMY;  Surgeon: Brabham, Vance W, MD;  Location: [facility];  Service: Cardiovascular;;  Rt. SFA  . PERIPHERAL VASCULAR CATHETERIZATION N/A 12/05/2014   Procedure: Abdominal Aortogram;  Surgeon: Brian L Chen, MD;  Location: [facility];  Service: Cardiovascular;  Laterality: N/A;  . PERIPHERAL VASCULAR CATHETERIZATION N/A 03/19/2015   Procedure: Lower Extremity Angiography;  Surgeon: Vance W Brabham, MD;  Location: [facility];  Service: Cardiovascular;  Laterality: N/A;  . PERIPHERAL VASCULAR CATHETERIZATION  03/19/2015   Procedure: Peripheral Vascular Intervention;  Surgeon: Vance W Brabham, MD;  Location: [facility];  Service: Cardiovascular;;  left sfa and popliteal stent  . TONSILLECTOMY    . UPPER EXTREMITY VENOGRAPHY N/A 09/17/2017   Procedure: UPPER EXTREMITY VENOGRAPHY;  Surgeon: Dickson, Christopher S, MD;  Location: [facility];  Service: Cardiovascular;  Laterality: N/A;   Social History   Socioeconomic History  . Marital status: Widowed    Spouse name: Not on file  . Number of children: Not on file  . Years of education: Not on   file  . Highest education level: Not on file  Occupational History  . Occupation: retired  Social Needs  . Financial resource strain: Not on file  . Food insecurity:    Worry: Not on file    Inability: Not on file  . Transportation needs:    Medical: Not on file    Non-medical: Not on file  Tobacco Use  . Smoking status: Never Smoker  . Smokeless tobacco:  Never Used  Substance and Sexual Activity  . Alcohol use: No  . Drug use: No  . Sexual activity: Not on file  Lifestyle  . Physical activity:    Days per week: Not on file    Minutes per session: Not on file  . Stress: Not on file  Relationships  . Social connections:    Talks on phone: Not on file    Gets together: Not on file    Attends religious service: Not on file    Active member of club or organization: Not on file    Attends meetings of clubs or organizations: Not on file    Relationship status: Not on file  Other Topics Concern  . Not on file  Social History Narrative  . Not on file   Family History  Problem Relation Age of Onset  . Diabetes Mother   . Heart disease Mother   . Hypertension Mother    - negative except otherwise stated in the family history section No Known Allergies Prior to Admission medications   Medication Sig Start Date End Date Taking? Authorizing Provider  albuterol (PROVENTIL HFA;VENTOLIN HFA) 108 (90 BASE) MCG/ACT inhaler Inhale 1-2 puffs into the lungs every 6 (six) hours as needed for wheezing. 12/27/12  Yes Palumbo, April, MD  b complex-vitamin c-folic acid (NEPHRO-VITE) 0.8 MG TABS tablet Take 1 tablet by mouth at bedtime.   Yes [provider]  calcium acetate (PHOSLO) 667 MG capsule Take 2,001 mg by mouth 3 (three) times daily with meals.   Yes [provider]  cinacalcet (SENSIPAR) 30 MG tablet Take 30 mg by mouth daily.   Yes [provider]  guaiFENesin (ROBITUSSIN) 100 MG/5ML SOLN Take 15 mLs by mouth every 4 (four) hours as needed for cough or to loosen phlegm.   Yes [provider]  insulin detemir (LEVEMIR) 100 UNIT/ML injection Inject 0.08 mLs (8 Units total) into the skin at bedtime. 04/27/18  Yes Vann, Jessica U, DO  levofloxacin (LEVAQUIN) 500 MG tablet Take 1 tablet (500 mg total) by mouth daily. 06/02/18  Yes Lockwood, Robert, MD  lidocaine-prilocaine (EMLA) cream Apply 1 application  topically daily as needed (prior to dialysis).  11/24/17  Yes [provider]  promethazine (PHENERGAN) 12.5 MG tablet Take 12.5 mg by mouth daily as needed for nausea or vomiting.  07/22/17  Yes [provider]  sevelamer carbonate (RENVELA) 2.4 g PACK Take 2.4 g by mouth 3 (three) times daily with meals.  09/06/17  Yes [provider]  traMADol (ULTRAM) 50 MG tablet Take 50 mg by mouth every 6 (six) hours as needed for moderate pain or severe pain.   Yes [provider]  warfarin (COUMADIN) 5 MG tablet TAKE 1 to 1.5 TABLETS BY MOUTH DAILY AS DIRECTED BY THE COUMADIN CLINIC. Patient taking differently: Take 5 mg by mouth daily.  04/29/18  Yes Vann, Jessica U, DO   Dg Chest 2 View  Result Date: 06/04/2018 CLINICAL DATA:  right leg pain onset last night, also reports new blisters   on right foot, reports a "couple falls last night when moving from the wheelchair to the bed." dialysis patient, on T/Th/S, did not receive treatment today. hx ESRD, htn, BKA left side. Hx DM. Pt also reports right sided hip pain. EXAM: CHEST - 2 VIEW COMPARISON:  06/02/2018 FINDINGS: Airspace opacities at the left lung base obscuring the left diaphragmatic leaflet. Mild interstitial prominence diffusely. Marked cardiomegaly.  Aortic Atherosclerosis (ICD10-170.0). No significant effusion. Visualized bones unremarkable. Vascular stents in the right subclavian and axillary region as before. IMPRESSION: 1.  Persistent airspace opacities at the left lung base. 2. Stable cardiomegaly. Electronically Signed   By: D  Hassell M.D.   On: 06/04/2018 12:17   Dg Pelvis 1-2 Views  Result Date: 06/04/2018 CLINICAL DATA:  right leg pain onset last night, also reports new blisters on right foot, reports a "couple falls last night when moving from the wheelchair to the bed." dialysis patient, on T/Th/S, did not receive treatment today. hx ESRD, htn, BKA left side. Hx DM. Pt also reports right sided hip pain.  EXAM: PELVIS - 1-2 VIEW COMPARISON:  CT 04/20/2013 and previous FINDINGS: There is no evidence of pelvic fracture or diastasis. No pelvic bone lesions are seen. Extensive iliofemoral and pelvic arterial calcifications. IMPRESSION: No acute findings. Electronically Signed   By: D  Hassell M.D.   On: 06/04/2018 12:18   Ct Angio Ao+bifem W & Or Wo Contrast  Result Date: 06/05/2018 CLINICAL DATA:  83-year-old with abnormal right foot. Gas gangrene. Previous arteriogram demonstrated areas of stenosis in the right SFA that were treated with arthrectomy and balloon angioplasty. Known occlusion of the right runoff vessels. EXAM: CT ANGIOGRAPHY OF ABDOMINAL AORTA WITH ILIOFEMORAL RUNOFF TECHNIQUE: Multidetector CT imaging of the abdomen, pelvis and lower extremities was performed using the standard protocol during bolus administration of intravenous contrast. Multiplanar CT image reconstructions and MIPs were obtained to evaluate the vascular anatomy. CONTRAST:  100mL ISOVUE-370 IOPAMIDOL (ISOVUE-370) INJECTION 76% COMPARISON:  Arteriogram report from 12/01/2017 FINDINGS: VASCULAR Aorta: Diffuse atherosclerotic disease in the abdominal aorta without aneurysm or dissection. Many of the images are limited due to motion artifact. Celiac: Mild stenosis in the proximal celiac trunk measuring 40-50%. Celiac trunk branches are heavily calcified. SMA: Patent without evidence of aneurysm, dissection, vasculitis or significant stenosis. Renals: Mixed plaque at the origin of the right renal artery causing mild-to-moderate stenosis. Large amount of motion artifact associated the renal arteries. No significant stenosis in the proximal left renal artery. IMA: IMA is heavily calcified but appears to be patent. RIGHT Lower Extremity Inflow: Right iliac arteries are heavily calcified. Common, internal and external iliac arteries are patent. No significant stenosis in the right common and right external iliac artery. Outflow: Right  common femoral artery is widely patent with mild disease. Profunda femoral arteries are patent. The right SFA is heavily calcified. Proximal right SFA is patent. Focal high-grade stenosis in the mid right SFA on sequence 6, image 290. There is another focus of critical stenosis or occlusion in the distal right SFA on image 308. Right popliteal artery is diffusely diseased with segmental areas of stenosis. Runoff: The runoff vessels are heavily calcified and difficult to evaluate lumen patency. Right runoff vessels are presumed to be occluded based on the previous angiogram report. Limited evaluation of the arteries at the ankle due to vessel calcifications. LEFT Lower Extremity Inflow: Left iliac arteries are heavily calcified but patent. No significant stenosis in the left common or external iliac artery. Outflow: Atherosclerotic calcifications in left   common femoral artery without significant stenosis. Left profunda femoral arteries are patent. Left SFA is heavily calcified and there is occlusion of the mid and distal SFA. Left popliteal artery appears to be occluded. Runoff: Below the knee amputation. Runoff vessels are heavily calcified and appear to be occluded. Veins: No obvious venous abnormality within the limitations of this arterial phase study. Review of the MIP images confirms the above findings. NON-VASCULAR Lower chest: Calcified granuloma at the right lung base. Small left pleural effusion. Heart is enlarged. Contrast refluxes into the hepatic veins and compatible with increased right heart pressures. Hepatobiliary: Limited evaluation due to motion artifact. Gallbladder is mildly distended with high-density material compatible with stones. No gross abnormality to the liver. Pancreas: Poorly characterized without gross abnormality. Spleen: Normal in size without focal abnormality. Adrenals/Urinary Tract: Mild fullness in left adrenal gland. Excessive motion artifact in the kidneys. Negative for  hydronephrosis. Urinary bladder is unremarkable. Stomach/Bowel: Stomach and bowel structures are unremarkable without dilatation. Excessive motion limits evaluation for inflammatory changes. Lymphatic: No significant lymph node enlargement in the abdomen or pelvis. Reproductive: Extensive vascularity around the uterus and difficult to exclude uterine calcifications. Limited evaluation of the uterus and adnexal structures on this examination. Other: Small amount of free fluid in the pelvis. Negative for free air. Musculoskeletal: Left below the knee amputation. No significant bone abnormalities in the right lower extremity. Diffuse osteopenia. IMPRESSION: VASCULAR 1. Right lower extremity outflow disease with multifocal stenosis in the right SFA. At least 2 critical areas of stenosis in the right SFA. 2. Limited evaluation of the runoff vessels due to severe calcifications. However, the right runoff vessels appear to be occluded and this corresponds with the previous arteriogram report. 3. Left outflow disease demonstrated by occlusion of the left mid and distal SFA and left popliteal artery. 4. Aorta and iliac arteries are heavily calcified without significant inflow stenosis. 5. Stenosis in the right renal artery. Mild stenosis involving the celiac trunk. NON-VASCULAR 1. Limited evaluation of the intra-abdominal structures due to excessive motion artifact. 2. Small amount of free fluid in the pelvis is nonspecific. 3. Small left pleural effusion. 4. Cholelithiasis. Electronically Signed   By: Adam  Henn M.D.   On: 06/05/2018 08:33   Dg Foot Complete Right  Result Date: 06/04/2018 CLINICAL DATA:  right leg pain onset last night, also reports new blisters on right foot, reports a "couple falls last night when moving from the wheelchair to the bed." dialysis patient, on T/Th/S, did not receive treatment today. hx ESRD, htn, BKA left side. Hx DM. Pt also reports right sided hip pain. EXAM: RIGHT FOOT COMPLETE - 3+  VIEW COMPARISON:  11/09/2017 FINDINGS: Diffuse osteopenia. Negative for fracture or dislocation. Extensive vascular calcifications. No radiodense foreign body. No subcutaneous gas. IMPRESSION: Osteopenia without acute finding. Electronically Signed   By: D  Hassell M.D.   On: 06/04/2018 12:15   - pertinent xrays, CT, MRI studies were reviewed and independently interpreted  Positive ROS: All other systems have been reviewed and were otherwise negative with the exception of those mentioned in the HPI and as above.  Physical Exam: General: Alert, no acute distress Psychiatric: Patient is competent for consent with normal mood and affect Lymphatic: No axillary or cervical lymphadenopathy Cardiovascular: No pedal edema Respiratory: No cyanosis, no use of accessory musculature GI: No organomegaly, abdomen is soft and non-tender    Images:  @ENCIMAGES@  Labs:  Lab Results  Component Value Date   HGBA1C 5.4 04/26/2018   HGBA1C 6.3 (  H) 12/04/2014   HGBA1C (H) 05/22/2010    6.4 (NOTE)                                                                       According to the ADA Clinical Practice Recommendations for 2011, when HbA1c is used as a screening test:   >=6.5%   Diagnostic of Diabetes Mellitus           (if abnormal result  is confirmed)  5.7-6.4%   Increased risk of developing Diabetes Mellitus  References:Diagnosis and Classification of Diabetes Mellitus,Diabetes Care,2011,34(Suppl 1):S62-S69 and Standards of Medical Care in         Diabetes - 2011,Diabetes Care,2011,34  (Suppl 1):S11-S61.   REPTSTATUS PENDING 06/04/2018   GRAMSTAIN NO WBC SEEN NO ORGANISMS SEEN  11/10/2017   CULT  06/04/2018    NO GROWTH < 12 HOURS Performed at [facility], [address] [place], [place] [address]    LABORGA ESCHERICHIA COLI 01/08/2013    Lab Results  Component Value Date   ALBUMIN 2.4 (L) 06/04/2018   ALBUMIN 2.5 (L) 06/02/2018   ALBUMIN 2.8 (L) 04/26/2018    Neurologic:  Patient does not have protective sensation bilateral lower extremities.   MUSCULOSKELETAL:   Skin: Examination patient has a dry mummified black gangrenous right foot.  There is no cellulitis no drainage.  Patient does not have a palpable pulse.  Her calf is warm.  She does have severe protein caloric malnutrition with albumin of 2.4.  Hemoglobin A1c is 5.4.  Her left below the knee amputation has healed well with no ulcers.  She does have a prosthesis from [employer].  She is currently cared for at home.  Review of the CT angiogram does show several areas of stenosis of the right SFA.  Assessment: Assessment: Diabetic insensate neuropathy end-stage renal disease on dialysis with severe protein caloric malnutrition with, peripheral vascular disease with areas of stenosis of the right SFA, dry gangrenous right foot without foot salvage options.  Plan: Plan: We will plan for a right below the knee amputation.  Risks and benefits were discussed with the patient and her family including risk of the incision not healing and need for either revision surgery or vascular intervention.  They both state they understand wish to proceed at this time plan for surgery on Wednesday.  Patient's diet was reordered for today.  Thank you for the consult and the opportunity to see Ms. Moser  Marcus Duda, MD [facility] [phone] 10:10 AM     

## 2018-06-05 NOTE — Progress Notes (Signed)
PROGRESS NOTE    Jackie [name]  ZOX:[id number] DOB: 09/25/1934 DOA: 06/04/2018 PCP: Mirna Mires, MD  Outpatient Specialists:   Brief Narrative:  Patient is an 83 year old African-American female, with past medical history significant for hypertension, diabetes mellitus, end-stage renal disease on hemodialysis and Tuesday, Thursday and Saturday; hyperlipidemia, anemia of chronic kidney disease, peripheral vascular disease status post left below-knee amputation.  Patient was seen alongside patient's son.  Patient is a poor historian.  Patient's son tells me that patient was admitted with recurrent falls.  On presentation, gangrene of right toes noted.  Apparently, patient may have had right SFA stenosis.  Patient has been seen by the orthopedic surgeon, Dr. Lajoyce Corners, and right BKA is planned for Wednesday.  Hypoglycemia has been noted during current hospitalization.  On further questioning, patient's son tells me that patient has had intermittent episodes of loss of consciousness during hemodialysis sessions.  Insulin is currently on hold.  2 ampoules of D50 given.  Patient is currently on D10 drip at 50 cc/h.  Nephrology team has been consulted.  No indication for emergent renal replacement therapy at this time.  Electrolytes are within normal limits.  No acidosis and no volume overload.  Assessment & Plan:   Principal Problem:   Gangrene (HCC) Active Problems:   ESRF (end stage renal failure) (HCC)   HTN (hypertension), benign   Diabetes mellitus (HCC)   Right foot ulcer, with necrosis of muscle (HCC)   Atrial fibrillation (HCC)   Essential hypertension   Mixed hyperlipidemia   PAD (peripheral artery disease) (HCC)   Long term (current) use of anticoagulants [Z79.01]  . dry gangrene right lower extremity (HCC) -Seen by orthopedics, Dr. Lajoyce Corners.   -For right below-knee amputation on Wednesday.   -Angio with runoff to evaluate circulation healing potential  . Atrial fibrillation  (HCC) -Rate controlled.   -Coumadin is on hold.   -Consider heparin drip when INR gets to less than 2. -Discuss safety of anticoagulation with cardiology team considering recurrent falls  Recurrent falls: Physical therapy consult.  Diabetes mellitus with hypoglycemia: Hold insulin D50 given D10 drip at rate of 50 cc an hour Continue Accu-Cheks Caloric count Further management depend on hospital course. Check HbA1c.  Marland Kitchen ESRD -Consult nephrology for hemodialysis -No indication for emergent renal replacement therapy at this time.  Anemia of chronic kidney disease: We will defer to nephrology team.  . PAD (peripheral artery disease) (HCC) -See above -For right BKA on Wednesday.   DVT prophylaxis: Patient was on Coumadin. Code Status: Full code Family Communication: Son Disposition Plan: Will depend on hospital course.   Consultants:   Orthopedic team.  Nephrology  Procedures:   For right BKA on Wednesday  Antimicrobials:   IV Zosyn   Subjective: Poor historian No complaints No chest pain No shortness of breath No fever or chills Low blood sugar noted  Objective: Vitals:   06/04/18 1901 06/04/18 2327 06/05/18 0359 06/05/18 0720  BP:    123/77  Pulse: (!) 113   (!) 123  Resp: 17   (!) 21  Temp: 97.9 F (36.6 C) 98 F (36.7 C) 98 F (36.7 C) 98.3 F (36.8 C)  TempSrc: Oral Oral Oral Axillary  SpO2: 98%   91%  Weight:       No intake or output data in the 24 hours ending 06/05/18 1035 Filed Weights   06/04/18 1900  Weight: 56.7 kg    Examination:  General exam: Appears calm and comfortable.  Chronically ill looking.  Left BKA. Respiratory system: Clear to auscultation. Respiratory effort normal. Cardiovascular system: S1 & S2 heard.  No pedal edema. Gastrointestinal system: Abdomen is obese, soft and nontender. No organomegaly or masses felt. Normal bowel sounds heard. Central nervous system: Alert and oriented. No focal neurological  deficits. Extremities: Left BKA.  Dry gangrene of the right toes.   Skin: Dark discoloration of right lower leg.  Data Reviewed: I have personally reviewed following labs and imaging studies  CBC: Recent Labs  Lab 06/02/18 1905 06/04/18 1055 06/05/18 0258  WBC 11.6* 8.5 8.3  NEUTROABS 9.5* 7.3  --   HGB 10.7* 9.6* 8.8*  HCT 33.8* 30.2* 28.0*  MCV 90.6 89.1 88.1  PLT 146* 139* 137*   Basic Metabolic Panel: Recent Labs  Lab 06/02/18 1905 06/04/18 1055 06/05/18 0258  NA 135 138 138  K 3.9 3.2* 3.3*  CL 98 99 100  CO2 23 26 23   GLUCOSE 78 139* 132*  BUN 8 28* 37*  CREATININE 2.68* 5.51* 6.51*  CALCIUM 8.5* 9.1 9.0   GFR: Estimated Creatinine Clearance: 6.1 mL/min (A) (by C-G formula based on SCr of 6.51 mg/dL (H)). Liver Function Tests: Recent Labs  Lab 06/02/18 1905 06/04/18 1055  AST 36 28  ALT 17 17  ALKPHOS 71 70  BILITOT 2.3* 1.3*  PROT 7.0 6.6  ALBUMIN 2.5* 2.4*   No results for input(s): LIPASE, AMYLASE in the last 168 hours. No results for input(s): AMMONIA in the last 168 hours. Coagulation Profile: Recent Labs  Lab 06/03/18 06/04/18 1055 06/05/18 0258  INR 4.5* 3.02 2.87   Cardiac Enzymes: Recent Labs  Lab 06/02/18 1905  TROPONINI 0.04*   BNP (last 3 results) No results for input(s): PROBNP in the last 8760 hours. HbA1C: No results for input(s): HGBA1C in the last 72 hours. CBG: Recent Labs  Lab 06/04/18 2157 06/05/18 0716 06/05/18 0733 06/05/18 0842 06/05/18 0947  GLUCAP 219* 48* 193* 103* 72   Lipid Profile: No results for input(s): CHOL, HDL, LDLCALC, TRIG, CHOLHDL, LDLDIRECT in the last 72 hours. Thyroid Function Tests: No results for input(s): TSH, T4TOTAL, FREET4, T3FREE, THYROIDAB in the last 72 hours. Anemia Panel: No results for input(s): VITAMINB12, FOLATE, FERRITIN, TIBC, IRON, RETICCTPCT in the last 72 hours. Urine analysis:    Component Value Date/Time   COLORURINE BROWN (A) 03/30/2018 1210   APPEARANCEUR TURBID  (A) 03/30/2018 1210   LABSPEC 1.020 03/30/2018 1210   PHURINE 8.0 03/30/2018 1210   GLUCOSEU NEGATIVE 03/30/2018 1210   HGBUR LARGE (A) 03/30/2018 1210   BILIRUBINUR MODERATE (A) 03/30/2018 1210   KETONESUR NEGATIVE 03/30/2018 1210   PROTEINUR >300 (A) 03/30/2018 1210   UROBILINOGEN 0.2 01/08/2013 1318   NITRITE NEGATIVE 03/30/2018 1210   LEUKOCYTESUR MODERATE (A) 03/30/2018 1210   Sepsis Labs: @LABRCNTIP (procalcitonin:4,lacticidven:4)  ) Recent Results (from the past 240 hour(s))  Culture, blood (Routine X 2) w Reflex to ID Panel     Status: None (Preliminary result)   Collection Time: 06/04/18 10:55 AM  Result Value Ref Range Status   Specimen Description BLOOD RIGHT FOREARM  Final   Special Requests   Final    BOTTLES DRAWN AEROBIC AND ANAEROBIC Blood Culture adequate volume   Culture   Final    NO GROWTH < 12 HOURS Performed at [facility], [address], [place], [place] [address]    Report Status PENDING  Incomplete  Culture, blood (Routine X 2) w Reflex to ID Panel     Status: None (Preliminary  result)   Collection Time: 06/04/18 11:15 AM  Result Value Ref Range Status   Specimen Description BLOOD RIGHT HAND  Final   Special Requests   Final    BOTTLES DRAWN AEROBIC AND ANAEROBIC Blood Culture adequate volume   Culture   Final    NO GROWTH < 12 HOURS Performed at [facility], [address] [place], [place] [address]    Report Status PENDING  Incomplete  MRSA PCR Screening     Status: None   Collection Time: 06/04/18  7:05 PM  Result Value Ref Range Status   MRSA by PCR NEGATIVE NEGATIVE Final    Comment:        The GeneXpert MRSA Assay (FDA approved for NASAL specimens only), is one component of a comprehensive MRSA colonization surveillance program. It is not intended to diagnose MRSA infection nor to guide or monitor treatment for MRSA infections. Performed at [facility], [address] [place], [place] [address]           Radiology Studies: Dg Chest 2 View  Result Date: 06/04/2018 CLINICAL DATA:  right leg pain onset last night, also reports new blisters on right foot, reports a "couple falls last night when moving from the wheelchair to the bed." dialysis patient, on T/Th/S, did not receive treatment today. hx ESRD, htn, BKA left side. Hx DM. Pt also reports right sided hip pain. EXAM: CHEST - 2 VIEW COMPARISON:  06/02/2018 FINDINGS: Airspace opacities at the left lung base obscuring the left diaphragmatic leaflet. Mild interstitial prominence diffusely. Marked cardiomegaly.  Aortic Atherosclerosis (ICD10-170.0). No significant effusion. Visualized bones unremarkable. Vascular stents in the right subclavian and axillary region as before. IMPRESSION: 1.  Persistent airspace opacities at the left lung base. 2. Stable cardiomegaly. Electronically Signed   By: Corlis Leak M.D.   On: 06/04/2018 12:17   Dg Pelvis 1-2 Views  Result Date: 06/04/2018 CLINICAL DATA:  right leg pain onset last night, also reports new blisters on right foot, reports a "couple falls last night when moving from the wheelchair to the bed." dialysis patient, on T/Th/S, did not receive treatment today. hx ESRD, htn, BKA left side. Hx DM. Pt also reports right sided hip pain. EXAM: PELVIS - 1-2 VIEW COMPARISON:  CT 04/20/2013 and previous FINDINGS: There is no evidence of pelvic fracture or diastasis. No pelvic bone lesions are seen. Extensive iliofemoral and pelvic arterial calcifications. IMPRESSION: No acute findings. Electronically Signed   By: Corlis Leak M.D.   On: 06/04/2018 12:18   Ct Angio Ao+bifem W & Or Wo Contrast  Result Date: 06/05/2018 CLINICAL DATA:  83 year old with abnormal right foot. Gas gangrene. Previous arteriogram demonstrated areas of stenosis in the right SFA that were treated with arthrectomy and balloon angioplasty. Known occlusion of the right runoff vessels. EXAM: CT ANGIOGRAPHY OF ABDOMINAL AORTA WITH ILIOFEMORAL  RUNOFF TECHNIQUE: Multidetector CT imaging of the abdomen, pelvis and lower extremities was performed using the standard protocol during bolus administration of intravenous contrast. Multiplanar CT image reconstructions and MIPs were obtained to evaluate the vascular anatomy. CONTRAST:  ISOVUE-370 IOPAMIDOL (ISOVUE-370) INJECTION 76% COMPARISON:  Arteriogram report from 12/01/2017 FINDINGS: VASCULAR Aorta: Diffuse atherosclerotic disease in the abdominal aorta without aneurysm or dissection. Many of the images are limited due to motion artifact. Celiac: Mild stenosis in the proximal celiac trunk measuring 40-50%. Celiac trunk branches are heavily calcified. SMA: Patent without evidence of aneurysm, dissection, vasculitis or significant stenosis. Renals: Mixed plaque at the  origin of the right renal artery causing mild-to-moderate stenosis. Large amount of motion artifact associated the renal arteries. No significant stenosis in the proximal left renal artery. IMA: IMA is heavily calcified but appears to be patent. RIGHT Lower Extremity Inflow: Right iliac arteries are heavily calcified. Common, internal and external iliac arteries are patent. No significant stenosis in the right common and right external iliac artery. Outflow: Right common femoral artery is widely patent with mild disease. Profunda femoral arteries are patent. The right SFA is heavily calcified. Proximal right SFA is patent. Focal high-grade stenosis in the mid right SFA on sequence 6, image 290. There is another focus of critical stenosis or occlusion in the distal right SFA on image 308. Right popliteal artery is diffusely diseased with segmental areas of stenosis. Runoff: The runoff vessels are heavily calcified and difficult to evaluate lumen patency. Right runoff vessels are presumed to be occluded based on the previous angiogram report. Limited evaluation of the arteries at the ankle due to vessel calcifications. LEFT Lower Extremity  Inflow: Left iliac arteries are heavily calcified but patent. No significant stenosis in the left common or external iliac artery. Outflow: Atherosclerotic calcifications in left common femoral artery without significant stenosis. Left profunda femoral arteries are patent. Left SFA is heavily calcified and there is occlusion of the mid and distal SFA. Left popliteal artery appears to be occluded. Runoff: Below the knee amputation. Runoff vessels are heavily calcified and appear to be occluded. Veins: No obvious venous abnormality within the limitations of this arterial phase study. Review of the MIP images confirms the above findings. NON-VASCULAR Lower chest: Calcified granuloma at the right lung base. Small left pleural effusion. Heart is enlarged. Contrast refluxes into the hepatic veins and compatible with increased right heart pressures. Hepatobiliary: Limited evaluation due to motion artifact. Gallbladder is mildly distended with high-density material compatible with stones. No gross abnormality to the liver. Pancreas: Poorly characterized without gross abnormality. Spleen: Normal in size without focal abnormality. Adrenals/Urinary Tract: Mild fullness in left adrenal gland. Excessive motion artifact in the kidneys. Negative for hydronephrosis. Urinary bladder is unremarkable. Stomach/Bowel: Stomach and bowel structures are unremarkable without dilatation. Excessive motion limits evaluation for inflammatory changes. Lymphatic: No significant lymph node enlargement in the abdomen or pelvis. Reproductive: Extensive vascularity around the uterus and difficult to exclude uterine calcifications. Limited evaluation of the uterus and adnexal structures on this examination. Other: Small amount of free fluid in the pelvis. Negative for free air. Musculoskeletal: Left below the knee amputation. No significant bone abnormalities in the right lower extremity. Diffuse osteopenia. IMPRESSION: VASCULAR 1. Right lower  extremity outflow disease with multifocal stenosis in the right SFA. At least 2 critical areas of stenosis in the right SFA. 2. Limited evaluation of the runoff vessels due to severe calcifications. However, the right runoff vessels appear to be occluded and this corresponds with the previous arteriogram report. 3. Left outflow disease demonstrated by occlusion of the left mid and distal SFA and left popliteal artery. 4. Aorta and iliac arteries are heavily calcified without significant inflow stenosis. 5. Stenosis in the right renal artery. Mild stenosis involving the celiac trunk. NON-VASCULAR 1. Limited evaluation of the intra-abdominal structures due to excessive motion artifact. 2. Small amount of free fluid in the pelvis is nonspecific. 3. Small left pleural effusion. 4. Cholelithiasis. Electronically Signed   By: Richarda OverlieAdam  Henn M.D.   On: 06/05/2018 08:33   Dg Foot Complete Right  Result Date: 06/04/2018 CLINICAL DATA:  right leg pain  onset last night, also reports new blisters on right foot, reports a "couple falls last night when moving from the wheelchair to the bed." dialysis patient, on T/Th/S, did not receive treatment today. hx ESRD, htn, BKA left side. Hx DM. Pt also reports right sided hip pain. EXAM: RIGHT FOOT COMPLETE - 3+ VIEW COMPARISON:  11/09/2017 FINDINGS: Diffuse osteopenia. Negative for fracture or dislocation. Extensive vascular calcifications. No radiodense foreign body. No subcutaneous gas. IMPRESSION: Osteopenia without acute finding. Electronically Signed   By: Corlis Leak M.D.   On: 06/04/2018 12:15        Scheduled Meds: . calcium acetate  2,001 mg Oral TID WC  . cinacalcet  30 mg Oral Q supper  . dextrose      . insulin aspart  0-5 Units Subcutaneous QHS  . insulin aspart  0-9 Units Subcutaneous TID WC  . insulin detemir  8 Units Subcutaneous QHS  . [START ON 06/06/2018] pneumococcal 23 valent vaccine  0.5 mL Intramuscular Tomorrow-1000  . sevelamer carbonate  2.4 g  Oral TID WC   Continuous Infusions: . piperacillin-tazobactam (ZOSYN)  IV 3.375 g (06/04/18 2201)     LOS: 1 day    Time spent: 35 minutes.    Berton Mount, MD  [facility] Pager #: [phone] 7PM-7AM contact night coverage as above

## 2018-06-05 NOTE — Progress Notes (Signed)
At shift change  Patient very slow in response to name call , appears drowsy, Blood sugar done and is 43mg /dl . HR 120, 50 mls of 50% DW administered IV.Dr Leonia CoronaS Ogbata contacted re  Low BS - verbal orders given for  Q1h x4 blood sugar checks. To call him with results in the next hour. For dialysis today.care continues.

## 2018-06-05 NOTE — Consult Note (Addendum)
ORTHOPAEDIC CONSULTATION  REQUESTING PHYSICIAN: Barnetta Chapel, MD  Chief Complaint: Painful dry gangrene right foot  HPI: Jackie [name] is a 83 y.o. female who presents with multiple 5 dry gangrene of the right foot.  Patient is status post a left transtibial amputation which has healed well she does have a prosthesis from [employer].  Patient's left transtibial amputation was 3 years ago.  Patient has been followed by Dr. Myra Gianotti for revascularization procedures and has been followed by podiatry for wound care.  Patient has diabetes end-stage renal disease on dialysis Tuesday Thursday Saturday.  Past Medical History:  Diagnosis Date  . Anemia   . Arthritis   . Diabetes mellitus   . End-stage renal disease on hemodialysis (HCC)    dialysis T/Th/Sa  . Hx of BKA, left (HCC)   . Hyperlipidemia   . Hypertension   . Pneumonia    Past Surgical History:  Procedure Laterality Date  . ABDOMINAL AORTOGRAM W/LOWER EXTREMITY N/A 12/01/2017   Procedure: ABDOMINAL AORTOGRAM W/LOWER EXTREMITY;  Surgeon: Nada Libman, MD;  Location: [facility] INVASIVE CV LAB;  Service: Cardiovascular;  Laterality: N/A;  bilateral  . AMPUTATION Left 04/24/2015   Procedure: LEFT BELOW KNEE AMPUTATION;  Surgeon: Nadara Mustard, MD;  Location: [facility] OR;  Service: Orthopedics;  Laterality: Left;  . ARTERIOVENOUS GRAFT PLACEMENT Left    non function  . AV FISTULA PLACEMENT Right 01/30/2013   Procedure: ARTERIOVENOUS (AV) FISTULA CREATION;  Surgeon: Fransisco Hertz, MD;  Location: [facility] OR;  Service: Vascular;  Laterality: Right;  . AV FISTULA PLACEMENT Left 10/04/2017   Procedure: INSERTION OF ARTERIOVENOUS (AV) GORE-TEX GRAFT LEFT UPPER ARM;  Surgeon: Chuck Hint, MD;  Location: [facility] OR;  Service: Vascular;  Laterality: Left;  . CESAREAN SECTION    . EYE SURGERY Bilateral    catarct  . FISTULA SUPERFICIALIZATION Right 04/24/2013   Procedure: FISTULA SUPERFICIALIZATION;  Surgeon: Larina Earthly, MD;  Location: [facility] OR;   Service: Vascular;  Laterality: Right;  . FISTULOGRAM Right 07/26/2013   Procedure: FISTULOGRAM;  Surgeon: Larina Earthly, MD;  Location: [facility] CATH LAB;  Service: Cardiovascular;  Laterality: Right;  . INSERTION OF DIALYSIS CATHETER Right 04/24/2013   Procedure: INSERTION OF DIALYSIS CATHETER;  Surgeon: Larina Earthly, MD;  Location: [facility] OR;  Service: Vascular;  Laterality: Right;  . PERIPHERAL VASCULAR ATHERECTOMY  12/01/2017   Procedure: PERIPHERAL VASCULAR ATHERECTOMY;  Surgeon: Nada Libman, MD;  Location: [facility] CV LAB;  Service: Cardiovascular;;  Rt. SFA  . PERIPHERAL VASCULAR CATHETERIZATION N/A 12/05/2014   Procedure: Abdominal Aortogram;  Surgeon: Fransisco Hertz, MD;  Location: [facility] INVASIVE CV LAB;  Service: Cardiovascular;  Laterality: N/A;  . PERIPHERAL VASCULAR CATHETERIZATION N/A 03/19/2015   Procedure: Lower Extremity Angiography;  Surgeon: Nada Libman, MD;  Location: [facility]VAS[facility];  Service: Cardiovascular;  Laterality: N/A;  . PERIPHERAL VASCULAR CATHETERIZATION  03/19/2015   Procedure: Peripheral Vascular Intervention;  Surgeon: Nada Libman, MD;  Location: [facility] INVAS[facility] CV LAB;  Service: Cardiovascular;;  left sfa and popliteal stent  . TONSILLECTOMY    . UPPER EXTREMITY VENOGRAPHY N/A 09/17/2017   Procedure: UPPER EXTREMITY VENOGRAPHY;  Surgeon: Chuck Hint, MD;  Location: [facility] INVASIVE CV LAB;  Service: Cardiovascular;  Laterality: N/A;   Social History   Socioeconomic History  . Marital status: Widowed    Spouse name: Not on file  . Number of children: Not on file  . Years of education: Not on  file  . Highest education level: Not on file  Occupational History  . Occupation: retired  Engineer, production  . Financial resource strain: Not on file  . Food insecurity:    Worry: Not on file    Inability: Not on file  . Transportation needs:    Medical: Not on file    Non-medical: Not on file  Tobacco Use  . Smoking status: Never Smoker  . Smokeless tobacco:  Never Used  Substance and Sexual Activity  . Alcohol use: No  . Drug use: No  . Sexual activity: Not on file  Lifestyle  . Physical activity:    Days per week: Not on file    Minutes per session: Not on file  . Stress: Not on file  Relationships  . Social connections:    Talks on phone: Not on file    Gets together: Not on file    Attends religious service: Not on file    Active member of club or organization: Not on file    Attends meetings of clubs or organizations: Not on file    Relationship status: Not on file  Other Topics Concern  . Not on file  Social History Narrative  . Not on file   Family History  Problem Relation Age of Onset  . Diabetes Mother   . Heart disease Mother   . Hypertension Mother    - negative except otherwise stated in the family history section No Known Allergies Prior to Admission medications   Medication Sig Start Date End Date Taking? Authorizing Provider  albuterol (PROVENTIL HFA;VENTOLIN HFA) 108 (90 BASE) MCG/ACT inhaler Inhale 1-2 puffs into the lungs every 6 (six) hours as needed for wheezing. 12/27/12  Yes Palumbo, April, MD  b complex-vitamin c-folic acid (NEPHRO-VITE) 0.8 MG TABS tablet Take 1 tablet by mouth at bedtime.   Yes [provider]  calcium acetate (PHOSLO) 667 MG capsule Take 2,001 mg by mouth 3 (three) times daily with meals.   Yes [provider]  cinacalcet (SENSIPAR) 30 MG tablet Take 30 mg by mouth daily.   Yes [provider]  guaiFENesin (ROBITUSSIN) 100 MG/5ML SOLN Take 15 mLs by mouth every 4 (four) hours as needed for cough or to loosen phlegm.   Yes [provider]  insulin detemir (LEVEMIR) 100 UNIT/ML injection Inject 0.08 mLs (8 Units total) into the skin at bedtime. 04/27/18  Yes Vann, Jessica U, DO  levofloxacin (LEVAQUIN) 500 MG tablet Take 1 tablet (500 mg total) by mouth daily. 06/02/18  Yes Gerhard Munch, MD  lidocaine-prilocaine (EMLA) cream Apply 1 application  topically daily as needed (prior to dialysis).  11/24/17  Yes [provider]  promethazine (PHENERGAN) 12.5 MG tablet Take 12.5 mg by mouth daily as needed for nausea or vomiting.  07/22/17  Yes [provider]  sevelamer carbonate (RENVELA) 2.4 g PACK Take 2.4 g by mouth 3 (three) times daily with meals.  09/06/17  Yes [provider]  traMADol (ULTRAM) 50 MG tablet Take 50 mg by mouth every 6 (six) hours as needed for moderate pain or severe pain.   Yes [provider]  warfarin (COUMADIN) 5 MG tablet TAKE 1 to 1.5 TABLETS BY MOUTH DAILY AS DIRECTED BY THE COUMADIN CLINIC. Patient taking differently: Take 5 mg by mouth daily.  04/29/18  Yes Joseph Art, DO   Dg Chest 2 View  Result Date: 06/04/2018 CLINICAL DATA:  right leg pain onset last night, also reports new blisters  on right foot, reports a "couple falls last night when moving from the wheelchair to the bed." dialysis patient, on T/Th/S, did not receive treatment today. hx ESRD, htn, BKA left side. Hx DM. Pt also reports right sided hip pain. EXAM: CHEST - 2 VIEW COMPARISON:  06/02/2018 FINDINGS: Airspace opacities at the left lung base obscuring the left diaphragmatic leaflet. Mild interstitial prominence diffusely. Marked cardiomegaly.  Aortic Atherosclerosis (ICD10-170.0). No significant effusion. Visualized bones unremarkable. Vascular stents in the right subclavian and axillary region as before. IMPRESSION: 1.  Persistent airspace opacities at the left lung base. 2. Stable cardiomegaly. Electronically Signed   By: Corlis Leak  Hassell M.D.   On: 06/04/2018 12:17   Dg Pelvis 1-2 Views  Result Date: 06/04/2018 CLINICAL DATA:  right leg pain onset last night, also reports new blisters on right foot, reports a "couple falls last night when moving from the wheelchair to the bed." dialysis patient, on T/Th/S, did not receive treatment today. hx ESRD, htn, BKA left side. Hx DM. Pt also reports right sided hip pain.  EXAM: PELVIS - 1-2 VIEW COMPARISON:  CT 04/20/2013 and previous FINDINGS: There is no evidence of pelvic fracture or diastasis. No pelvic bone lesions are seen. Extensive iliofemoral and pelvic arterial calcifications. IMPRESSION: No acute findings. Electronically Signed   By: Corlis Leak  Hassell M.D.   On: 06/04/2018 12:18   Ct Angio Ao+bifem W & Or Wo Contrast  Result Date: 06/05/2018 CLINICAL DATA:  83 year old with abnormal right foot. Gas gangrene. Previous arteriogram demonstrated areas of stenosis in the right SFA that were treated with arthrectomy and balloon angioplasty. Known occlusion of the right runoff vessels. EXAM: CT ANGIOGRAPHY OF ABDOMINAL AORTA WITH ILIOFEMORAL RUNOFF TECHNIQUE: Multidetector CT imaging of the abdomen, pelvis and lower extremities was performed using the standard protocol during bolus administration of intravenous contrast. Multiplanar CT image reconstructions and MIPs were obtained to evaluate the vascular anatomy. CONTRAST:  100mL ISOVUE-370 IOPAMIDOL (ISOVUE-370) INJECTION 76% COMPARISON:  Arteriogram report from 12/01/2017 FINDINGS: VASCULAR Aorta: Diffuse atherosclerotic disease in the abdominal aorta without aneurysm or dissection. Many of the images are limited due to motion artifact. Celiac: Mild stenosis in the proximal celiac trunk measuring 40-50%. Celiac trunk branches are heavily calcified. SMA: Patent without evidence of aneurysm, dissection, vasculitis or significant stenosis. Renals: Mixed plaque at the origin of the right renal artery causing mild-to-moderate stenosis. Large amount of motion artifact associated the renal arteries. No significant stenosis in the proximal left renal artery. IMA: IMA is heavily calcified but appears to be patent. RIGHT Lower Extremity Inflow: Right iliac arteries are heavily calcified. Common, internal and external iliac arteries are patent. No significant stenosis in the right common and right external iliac artery. Outflow: Right  common femoral artery is widely patent with mild disease. Profunda femoral arteries are patent. The right SFA is heavily calcified. Proximal right SFA is patent. Focal high-grade stenosis in the mid right SFA on sequence 6, image 290. There is another focus of critical stenosis or occlusion in the distal right SFA on image 308. Right popliteal artery is diffusely diseased with segmental areas of stenosis. Runoff: The runoff vessels are heavily calcified and difficult to evaluate lumen patency. Right runoff vessels are presumed to be occluded based on the previous angiogram report. Limited evaluation of the arteries at the ankle due to vessel calcifications. LEFT Lower Extremity Inflow: Left iliac arteries are heavily calcified but patent. No significant stenosis in the left common or external iliac artery. Outflow: Atherosclerotic calcifications in left  common femoral artery without significant stenosis. Left profunda femoral arteries are patent. Left SFA is heavily calcified and there is occlusion of the mid and distal SFA. Left popliteal artery appears to be occluded. Runoff: Below the knee amputation. Runoff vessels are heavily calcified and appear to be occluded. Veins: No obvious venous abnormality within the limitations of this arterial phase study. Review of the MIP images confirms the above findings. NON-VASCULAR Lower chest: Calcified granuloma at the right lung base. Small left pleural effusion. Heart is enlarged. Contrast refluxes into the hepatic veins and compatible with increased right heart pressures. Hepatobiliary: Limited evaluation due to motion artifact. Gallbladder is mildly distended with high-density material compatible with stones. No gross abnormality to the liver. Pancreas: Poorly characterized without gross abnormality. Spleen: Normal in size without focal abnormality. Adrenals/Urinary Tract: Mild fullness in left adrenal gland. Excessive motion artifact in the kidneys. Negative for  hydronephrosis. Urinary bladder is unremarkable. Stomach/Bowel: Stomach and bowel structures are unremarkable without dilatation. Excessive motion limits evaluation for inflammatory changes. Lymphatic: No significant lymph node enlargement in the abdomen or pelvis. Reproductive: Extensive vascularity around the uterus and difficult to exclude uterine calcifications. Limited evaluation of the uterus and adnexal structures on this examination. Other: Small amount of free fluid in the pelvis. Negative for free air. Musculoskeletal: Left below the knee amputation. No significant bone abnormalities in the right lower extremity. Diffuse osteopenia. IMPRESSION: VASCULAR 1. Right lower extremity outflow disease with multifocal stenosis in the right SFA. At least 2 critical areas of stenosis in the right SFA. 2. Limited evaluation of the runoff vessels due to severe calcifications. However, the right runoff vessels appear to be occluded and this corresponds with the previous arteriogram report. 3. Left outflow disease demonstrated by occlusion of the left mid and distal SFA and left popliteal artery. 4. Aorta and iliac arteries are heavily calcified without significant inflow stenosis. 5. Stenosis in the right renal artery. Mild stenosis involving the celiac trunk. NON-VASCULAR 1. Limited evaluation of the intra-abdominal structures due to excessive motion artifact. 2. Small amount of free fluid in the pelvis is nonspecific. 3. Small left pleural effusion. 4. Cholelithiasis. Electronically Signed   By: Richarda Overlie M.D.   On: 06/05/2018 08:33   Dg Foot Complete Right  Result Date: 06/04/2018 CLINICAL DATA:  right leg pain onset last night, also reports new blisters on right foot, reports a "couple falls last night when moving from the wheelchair to the bed." dialysis patient, on T/Th/S, did not receive treatment today. hx ESRD, htn, BKA left side. Hx DM. Pt also reports right sided hip pain. EXAM: RIGHT FOOT COMPLETE - 3+  VIEW COMPARISON:  11/09/2017 FINDINGS: Diffuse osteopenia. Negative for fracture or dislocation. Extensive vascular calcifications. No radiodense foreign body. No subcutaneous gas. IMPRESSION: Osteopenia without acute finding. Electronically Signed   By: Corlis Leak M.D.   On: 06/04/2018 12:15   - pertinent xrays, CT, MRI studies were reviewed and independently interpreted  Positive ROS: All other systems have been reviewed and were otherwise negative with the exception of those mentioned in the HPI and as above.  Physical Exam: General: Alert, no acute distress Psychiatric: Patient is competent for consent with normal mood and affect Lymphatic: No axillary or cervical lymphadenopathy Cardiovascular: No pedal edema Respiratory: No cyanosis, no use of accessory musculature GI: No organomegaly, abdomen is soft and non-tender    Images:  @ENCIMAGES @  Labs:  Lab Results  Component Value Date   HGBA1C 5.4 04/26/2018   HGBA1C 6.3 (  H) 12/04/2014   HGBA1C (H) 05/22/2010    6.4 (NOTE)                                                                       According to the ADA Clinical Practice Recommendations for 2011, when HbA1c is used as a screening test:   >=6.5%   Diagnostic of Diabetes Mellitus           (if abnormal result  is confirmed)  5.7-6.4%   Increased risk of developing Diabetes Mellitus  References:Diagnosis and Classification of Diabetes Mellitus,Diabetes Care,2011,34(Suppl 1):S62-S69 and Standards of Medical Care in         Diabetes - 2011,Diabetes Care,2011,34  (Suppl 1):S11-S61.   REPTSTATUS PENDING 06/04/2018   GRAMSTAIN NO WBC SEEN NO ORGANISMS SEEN  11/10/2017   CULT  06/04/2018    NO GROWTH < 12 HOURS Performed at [facility], [address], [place], [place] [address]    [facility] ESCHERICHIA COLI 01/08/2013    Lab Results  Component Value Date   ALBUMIN 2.4 (L) 06/04/2018   ALBUMIN 2.5 (L) 06/02/2018   ALBUMIN 2.8 (L) 04/26/2018    Neurologic:  Patient does not have protective sensation bilateral lower extremities.   MUSCULOSKELETAL:   Skin: Examination patient has a dry mummified black gangrenous right foot.  There is no cellulitis no drainage.  Patient does not have a palpable pulse.  Her calf is warm.  She does have severe protein caloric malnutrition with albumin of 2.4.  Hemoglobin A1c is 5.4.  Her left below the knee amputation has healed well with no ulcers.  She does have a prosthesis from [employer].  She is currently cared for at home.  Review of the CT angiogram does show several areas of stenosis of the right SFA.  Assessment: Assessment: Diabetic insensate neuropathy end-stage renal disease on dialysis with severe protein caloric malnutrition with, peripheral vascular disease with areas of stenosis of the right SFA, dry gangrenous right foot without foot salvage options.  Plan: Plan: We will plan for a right below the knee amputation.  Risks and benefits were discussed with the patient and her family including risk of the incision not healing and need for either revision surgery or vascular intervention.  They both state they understand wish to proceed at this time plan for surgery on Wednesday.  Patient's diet was reordered for today.  Thank you for the consult and the opportunity to see Ms. Vita Erm, MD [facility] [phone] 10:10 AM

## 2018-06-05 NOTE — Plan of Care (Signed)

## 2018-06-05 NOTE — Consult Note (Addendum)
[facility] Renal Consultation Note  Indication for Consultation:  Management of ESRD/hemodialysis; anemia, hypertension/volume and secondary hyperparathyroidism  HPI: Jackie [name] is a 83 y.o. female with ESRD on Chronic HD (TTS , [facility]) known PAD Dr Trula Slade ( 12/01/17 R atherectomy with angioplasty of her right superficial femoral artery )  L BKA   Co falling out of bed  X 2 yesterday, missed HD  2/2 this. She reports Ulcer on Right foot followed by wound clinic in past , and pain in Left BKA since falling requested ER evaluation . In er noted to have dry Gangrene  of Right lower extremity.  She reports no chills, fevers, N/V/D. No cp or sob .has some discomfort in Right foot."Left prothesis is too heavy to use ."    Past Medical History:  Diagnosis Date  . Anemia   . Arthritis   . Diabetes mellitus   . End-stage renal disease on hemodialysis ([place])    dialysis T/Th/Sa  . Hx of BKA, left ([place])   . Hyperlipidemia   . Hypertension   . Pneumonia     Past Surgical History:  Procedure Laterality Date  . ABDOMINAL AORTOGRAM W/LOWER EXTREMITY N/A 12/01/2017   Procedure: ABDOMINAL AORTOGRAM W/LOWER EXTREMITY;  Surgeon: Serafina Mitchell, MD;  Location: [place] [facility];  Service: Cardiovascular;  Laterality: N/A;  bilateral  . AMPUTATION Left 04/24/2015   Procedure: LEFT BELOW KNEE AMPUTATION;  Surgeon: Newt Minion, MD;  Location: [place];  Service: Orthopedics;  Laterality: Left;  . ARTERIOVENOUS GRAFT PLACEMENT Left    non function  . AV FISTULA PLACEMENT Right 01/30/2013   Procedure: ARTERIOVENOUS (AV) FISTULA CREATION;  Surgeon: Conrad Gunnison, MD;  Location: [place];  Service: Vascular;  Laterality: Right;  . AV FISTULA PLACEMENT Left 10/04/2017   Procedure: INSERTION OF ARTERIOVENOUS (AV) GORE-TEX GRAFT LEFT UPPER ARM;  Surgeon: Angelia Mould, MD;  Location: [place];  Service: Vascular;  Laterality: Left;  . CESAREAN SECTION    . EYE SURGERY Bilateral    catarct  .  FISTULA SUPERFICIALIZATION Right 04/24/2013   Procedure: FISTULA SUPERFICIALIZATION;  Surgeon: Rosetta Posner, MD;  Location: [place];  Service: Vascular;  Laterality: Right;  . FISTULOGRAM Right 07/26/2013   Procedure: FISTULOGRAM;  Surgeon: Rosetta Posner, MD;  Location: [facility];  Service: Cardiovascular;  Laterality: Right;  . INSERTION OF DIALYSIS CATHETER Right 04/24/2013   Procedure: INSERTION OF DIALYSIS CATHETER;  Surgeon: Rosetta Posner, MD;  Location: [place];  Service: Vascular;  Laterality: Right;  . PERIPHERAL VASCULAR ATHERECTOMY  12/01/2017   Procedure: PERIPHERAL VASCULAR ATHERECTOMY;  Surgeon: Serafina Mitchell, MD;  Location: [place] [facility];  Service: Cardiovascular;;  Rt. SFA  . PERIPHERAL VASCULAR CATHETERIZATION N/A 12/05/2014   Procedure: Abdominal Aortogram;  Surgeon: Conrad Hummels Wharf, MD;  Location: [place] [facility];  Service: Cardiovascular;  Laterality: N/A;  . PERIPHERAL VASCULAR CATHETERIZATION N/A 03/19/2015   Procedure: Lower Extremity Angiography;  Surgeon: Serafina Mitchell, MD;  Location: [place] [facility];  Service: Cardiovascular;  Laterality: N/A;  . PERIPHERAL VASCULAR CATHETERIZATION  03/19/2015   Procedure: Peripheral Vascular Intervention;  Surgeon: Serafina Mitchell, MD;  Location: [place] [facility];  Service: Cardiovascular;;  left sfa and popliteal stent  . TONSILLECTOMY    . UPPER EXTREMITY VENOGRAPHY N/A 09/17/2017   Procedure: UPPER EXTREMITY VENOGRAPHY;  Surgeon: Angelia Mould, MD;  Location: [place] [facility];  Service: Cardiovascular;  Laterality: N/A;  Family History  Problem Relation Age of Onset  . Diabetes Mother   . Heart disease Mother   . Hypertension Mother       reports that she has never smoked. She has never used smokeless tobacco. She reports that she does not drink alcohol or use drugs.  No Known Allergies  Prior to Admission medications   Medication Sig Start Date End Date Taking? Authorizing Provider  albuterol (PROVENTIL  HFA;VENTOLIN HFA) 108 (90 BASE) MCG/ACT inhaler Inhale 1-2 puffs into the lungs every 6 (six) hours as needed for wheezing. 12/27/12  Yes Palumbo, April, MD  b complex-vitamin c-folic acid (NEPHRO-VITE) 0.8 MG TABS tablet Take 1 tablet by mouth at bedtime.   Yes [provider]  calcium acetate (PHOSLO) 667 MG capsule Take 2,001 mg by mouth 3 (three) times daily with meals.   Yes [provider]  cinacalcet (SENSIPAR) 30 MG tablet Take 30 mg by mouth daily.   Yes [provider]  guaiFENesin (ROBITUSSIN) 100 MG/5ML SOLN Take 15 mLs by mouth every 4 (four) hours as needed for cough or to loosen phlegm.   Yes [provider]  insulin detemir (LEVEMIR) 100 UNIT/ML injection Inject 0.08 mLs (8 Units total) into the skin at bedtime. 04/27/18  Yes Vann, Jessica U, DO  levofloxacin (LEVAQUIN) 500 MG tablet Take 1 tablet (500 mg total) by mouth daily. 06/02/18  Yes Carmin Muskrat, MD  lidocaine-prilocaine (EMLA) cream Apply 1 application topically daily as needed (prior to dialysis).  11/24/17  Yes [provider]  promethazine (PHENERGAN) 12.5 MG tablet Take 12.5 mg by mouth daily as needed for nausea or vomiting.  07/22/17  Yes [provider]  sevelamer carbonate (RENVELA) 2.4 g PACK Take 2.4 g by mouth 3 (three) times daily with meals.  09/06/17  Yes [provider]  traMADol (ULTRAM) 50 MG tablet Take 50 mg by mouth every 6 (six) hours as needed for moderate pain or severe pain.   Yes [provider]  warfarin (COUMADIN) 5 MG tablet TAKE 1 to 1.5 TABLETS BY MOUTH DAILY AS DIRECTED BY THE COUMADIN CLINIC. Patient taking differently: Take 5 mg by mouth daily.  04/29/18  Yes Geradine Girt, DO    DHR:CBULAGTX injection, ondansetron **OR** ondansetron (ZOFRAN) IV, polyethylene glycol  Results for orders placed or performed during the hospital encounter of 06/04/18 (from the past 48 hour(s))  CBC with Differential/Platelet     Status:  Abnormal   Collection Time: 06/04/18 10:55 AM  Result Value Ref Range   WBC 8.5 4.0 - 10.5 K/uL   RBC 3.39 (L) 3.87 - 5.11 MIL/uL   Hemoglobin 9.6 (L) 12.0 - 15.0 g/dL   HCT 30.2 (L) 36.0 - 46.0 %   MCV 89.1 80.0 - 100.0 fL   MCH 28.3 26.0 - 34.0 pg   MCHC 31.8 30.0 - 36.0 g/dL   RDW 17.7 (H) 11.5 - 15.5 %   Platelets 139 (L) 150 - 400 K/uL   nRBC 0.0 0.0 - 0.2 %   Neutrophils Relative % 86 %   Neutro Abs 7.3 1.7 - 7.7 K/uL   Lymphocytes Relative 7 %   Lymphs Abs 0.6 (L) 0.7 - 4.0 K/uL   Monocytes Relative 6 %   Monocytes Absolute 0.5 0.1 - 1.0 K/uL   Eosinophils Relative 0 %   Eosinophils Absolute 0.0 0.0 - 0.5 K/uL   Basophils Relative 0 %   Basophils Absolute 0.0 0.0 - 0.1 K/uL   Immature Granulocytes 1 %  Abs Immature Granulocytes 0.05 0.00 - 0.07 K/uL    Comment: Performed at [facility], [place] [address] [place], [place] [address]  Comprehensive metabolic panel     Status: Abnormal   Collection Time: 06/04/18 10:55 AM  Result Value Ref Range   Sodium 138 135 - 145 mmol/L   Potassium 3.2 (L) 3.5 - 5.1 mmol/L    Comment: DELTA CHECK NOTED   Chloride 99 98 - 111 mmol/L   CO2 26 22 - 32 mmol/L   Glucose, Bld 139 (H) 70 - 99 mg/dL   BUN 28 (H) 8 - 23 mg/dL   Creatinine, Ser 5.51 (H) 0.44 - 1.00 mg/dL    Comment: DELTA CHECK NOTED   Calcium 9.1 8.9 - 10.3 mg/dL   Total Protein 6.6 6.5 - 8.1 g/dL   Albumin 2.4 (L) 3.5 - 5.0 g/dL   AST 28 15 - 41 U/L   ALT 17 0 - 44 U/L   Alkaline Phosphatase 70 38 - 126 U/L   Total Bilirubin 1.3 (H) 0.3 - 1.2 mg/dL   GFR calc non Af Amer 7 (L) >60 mL/min   GFR calc Af Amer 8 (L) >60 mL/min    Comment: (NOTE) The eGFR has been calculated using the CKD EPI equation. This calculation has not been validated in all clinical situations. eGFR's persistently <60 mL/min signify possible Chronic Kidney Disease.    Anion gap 13 5 - 15    Comment: Performed at [facility] [address], [place], [place] [address]  Culture,  blood (Routine X 2) w Reflex to ID Panel     Status: None (Preliminary result)   Collection Time: 06/04/18 10:55 AM  Result Value Ref Range   Specimen Description BLOOD RIGHT FOREARM    Special Requests      BOTTLES DRAWN AEROBIC AND ANAEROBIC Blood Culture adequate volume   Culture      NO GROWTH < 12 HOURS Performed at [facility], [place] [address], [place], [place] [address]    Report Status PENDING   Protime-INR     Status: Abnormal   Collection Time: 06/04/18 10:55 AM  Result Value Ref Range   Prothrombin Time 30.9 (H) 11.4 - 15.2 seconds   INR 3.02     Comment: Performed at [facility] [address] [place], [place] [address]  Culture, blood (Routine X 2) w Reflex to ID Panel     Status: None (Preliminary result)   Collection Time: 06/04/18 11:15 AM  Result Value Ref Range   Specimen Description BLOOD RIGHT HAND    Special Requests      BOTTLES DRAWN AEROBIC AND ANAEROBIC Blood Culture adequate volume   Culture      NO GROWTH < 12 HOURS Performed at [facility], [place] [address], [place],  [address]    Report Status PENDING   MRSA PCR Screening     Status: None   Collection Time: 06/04/18  7:05 PM  Result Value Ref Range   MRSA by PCR NEGATIVE NEGATIVE    Comment:        The GeneXpert MRSA Assay (FDA approved for NASAL specimens only), is one component of a comprehensive MRSA colonization surveillance program. It is not intended to diagnose MRSA infection nor to guide or monitor treatment for MRSA infections. Performed at [facility], [place] [address] [place], [place] [address]   Glucose, capillary     Status: Abnormal  Collection Time: 06/04/18  7:36 PM  Result Value Ref Range   Glucose-Capillary 69 (L) 70 - 99 mg/dL  Glucose, capillary     Status: None   Collection Time: 06/04/18  8:14 PM  Result Value Ref Range   Glucose-Capillary 87 70 - 99 mg/dL  Glucose, capillary     Status: Abnormal   Collection Time: 06/04/18  9:57  PM  Result Value Ref Range   Glucose-Capillary 219 (H) 70 - 99 mg/dL  Protime-INR     Status: Abnormal   Collection Time: 06/05/18  2:58 AM  Result Value Ref Range   Prothrombin Time 29.7 (H) 11.4 - 15.2 seconds   INR 2.87     Comment: Performed at [facility], [place] [address] [place], [place] [address]  Basic metabolic panel     Status: Abnormal   Collection Time: 06/05/18  2:58 AM  Result Value Ref Range   Sodium 138 135 - 145 mmol/L   Potassium 3.3 (L) 3.5 - 5.1 mmol/L   Chloride 100 98 - 111 mmol/L   CO2 23 22 - 32 mmol/L   Glucose, Bld 132 (H) 70 - 99 mg/dL   BUN 37 (H) 8 - 23 mg/dL   Creatinine, Ser 6.51 (H) 0.44 - 1.00 mg/dL   Calcium 9.0 8.9 - 10.3 mg/dL   GFR calc non Af Amer 5 (L) >60 mL/min   GFR calc Af Amer 6 (L) >60 mL/min    Comment: (NOTE) The eGFR has been calculated using the CKD EPI equation. This calculation has not been validated in all clinical situations. eGFR's persistently <60 mL/min signify possible Chronic Kidney Disease.    Anion gap 15 5 - 15    Comment: Performed at [facility] [address] [place], [place] [address]  CBC     Status: Abnormal   Collection Time: 06/05/18  2:58 AM  Result Value Ref Range   WBC 8.3 4.0 - 10.5 K/uL   RBC 3.18 (L) 3.87 - 5.11 MIL/uL   Hemoglobin 8.8 (L) 12.0 - 15.0 g/dL   HCT 28.0 (L) 36.0 - 46.0 %   MCV 88.1 80.0 - 100.0 fL   MCH 27.7 26.0 - 34.0 pg   MCHC 31.4 30.0 - 36.0 g/dL   RDW 17.3 (H) 11.5 - 15.5 %   Platelets 137 (L) 150 - 400 K/uL   nRBC 0.0 0.0 - 0.2 %    Comment: Performed at [facility], [place] [address] [place], [place] [address]  Glucose, capillary     Status: Abnormal   Collection Time: 06/05/18  7:16 AM  Result Value Ref Range   Glucose-Capillary 48 (L) 70 - 99 mg/dL  Glucose, capillary     Status: Abnormal   Collection Time: 06/05/18  7:33 AM  Result Value Ref Range   Glucose-Capillary 193 (H) 70 - 99 mg/dL    ROS: see hpi  For positives   Physical  Exam: Vitals:   06/05/18 0359 06/05/18 0720  BP:  123/77  Pulse:  (!) 123  Resp:  (!) 21  Temp: 98 F (36.7 C) 98.3 F (36.8 C)  SpO2:  91%     General: alert , thin chronically ill appearing elderly AAF  HEENT: [place] , Not icteric , MM dry  Neck: no jvd Heart: Irreg  irreg , rate stable , 1/6 sem , no rub or gaqllop Lungs: CTA Abdomen: Soft, NT, ND ,no masses Extremities: Right foot severe Dry Gangrene all toes  and forefoot ,L BKA with dark skin over stump o ulcer ,no drainage Skin: as above  RLE, no overt rash , warm dry  Neuro: Alert  OX3, moves extem  Dialysis Access: Pos bruit LUA AVGG   Dialysis Orders: Center: [facility]   on TTS . EDW 55.5 HD Bath 2k, 2.25 ca  Time 4hr Heparin 1800. Access LUA [email or web address]     Calcitriol 2 mcg po /HD  Mircera 50 mg q 2 wks (last 05/14/17)  Assessment/Plan 1. Right Lower extrem . Gangrene - per admit team, on Zosyn ,  noted ortho  Consulted , and Plans for angio to eval circulation  potendtail  2. ESRD -  HD TTS Schedule, Missed hd yest  But lab and volume ok for hd tomor.  3. Atrial Fibrillation - inr 2.87   , on coumadin as op  ,noted Card consulted  4. Hypertension/volume  - bp and vol stable, no op bp meds  5. Anemia  - hgb 9.6  Give on HD tomor then q Tuesday  Hd starting 11/26 6. Metabolic bone disease - ca corec 10.3  On sensipar 30 mg q d, phoslo 3 ac and Renvela listed as binders   , po vit d on hd / dc phoslo with ^ ca  7. DM type 2 - per admit  8. Nutrition - alb 2.4 , renal carb mod diet ,nepro  Supplement, renal vit   Ernest Haber, PA-C [place] [phone] 06/05/2018, 9:16 AM   Pt seen, examined and agree w A/P as above.  ESRD pt w/ L BKA here w/ severe gangrene of the right foot. Seen by ortho, plan is for R BKA on Wed.  No acute HD needs, volume stable, lytes ok.  Plan next HD tomorrow.  Kelly Splinter MD [employer] pager [phone]   06/05/2018, 1:32 PM

## 2018-06-06 ENCOUNTER — Ambulatory Visit (INDEPENDENT_AMBULATORY_CARE_PROVIDER_SITE_OTHER): Payer: Self-pay | Admitting: Physician Assistant

## 2018-06-06 DIAGNOSIS — I4891 Unspecified atrial fibrillation: Secondary | ICD-10-CM

## 2018-06-06 DIAGNOSIS — I32 Pericarditis in diseases classified elsewhere: Secondary | ICD-10-CM

## 2018-06-06 DIAGNOSIS — N189 Chronic kidney disease, unspecified: Secondary | ICD-10-CM

## 2018-06-06 LAB — CBC WITH DIFFERENTIAL/PLATELET
Abs Immature Granulocytes: 0.04 10*3/uL (ref 0.00–0.07)
Basophils Absolute: 0 10*3/uL (ref 0.0–0.1)
Basophils Relative: 0 %
Eosinophils Absolute: 0 10*3/uL (ref 0.0–0.5)
Eosinophils Relative: 0 %
HCT: 29.7 % — ABNORMAL LOW (ref 36.0–46.0)
Hemoglobin: 9.1 g/dL — ABNORMAL LOW (ref 12.0–15.0)
Immature Granulocytes: 0 %
Lymphocytes Relative: 8 %
Lymphs Abs: 0.7 10*3/uL (ref 0.7–4.0)
MCH: 27.2 pg (ref 26.0–34.0)
MCHC: 30.6 g/dL (ref 30.0–36.0)
MCV: 88.9 fL (ref 80.0–100.0)
Monocytes Absolute: 0.7 10*3/uL (ref 0.1–1.0)
Monocytes Relative: 8 %
Neutro Abs: 7.9 10*3/uL — ABNORMAL HIGH (ref 1.7–7.7)
Neutrophils Relative %: 84 %
Platelets: 132 10*3/uL — ABNORMAL LOW (ref 150–400)
RBC: 3.34 MIL/uL — ABNORMAL LOW (ref 3.87–5.11)
RDW: 17.3 % — ABNORMAL HIGH (ref 11.5–15.5)
WBC: 9.4 10*3/uL (ref 4.0–10.5)
nRBC: 0 % (ref 0.0–0.2)

## 2018-06-06 LAB — GLUCOSE, CAPILLARY
GLUCOSE-CAPILLARY: 135 mg/dL — AB (ref 70–99)
GLUCOSE-CAPILLARY: 429 mg/dL — AB (ref 70–99)
Glucose-Capillary: 144 mg/dL — ABNORMAL HIGH (ref 70–99)
Glucose-Capillary: 162 mg/dL — ABNORMAL HIGH (ref 70–99)
Glucose-Capillary: 166 mg/dL — ABNORMAL HIGH (ref 70–99)
Glucose-Capillary: 74 mg/dL (ref 70–99)

## 2018-06-06 LAB — RENAL FUNCTION PANEL
Albumin: 2.1 g/dL — ABNORMAL LOW (ref 3.5–5.0)
Anion gap: 13 (ref 5–15)
BUN: 42 mg/dL — ABNORMAL HIGH (ref 8–23)
CO2: 24 mmol/L (ref 22–32)
Calcium: 9 mg/dL (ref 8.9–10.3)
Chloride: 97 mmol/L — ABNORMAL LOW (ref 98–111)
Creatinine, Ser: 7.55 mg/dL — ABNORMAL HIGH (ref 0.44–1.00)
GFR calc Af Amer: 5 mL/min — ABNORMAL LOW (ref >60)
GFR calc non Af Amer: 4 mL/min — ABNORMAL LOW (ref >60)
Glucose, Bld: 161 mg/dL — ABNORMAL HIGH (ref 70–99)
Phosphorus: 5.3 mg/dL — ABNORMAL HIGH (ref 2.5–4.6)
Potassium: 3.9 mmol/L (ref 3.5–5.1)
Sodium: 134 mmol/L — ABNORMAL LOW (ref 135–145)

## 2018-06-06 LAB — MAGNESIUM: Magnesium: 2.2 mg/dL (ref 1.7–2.4)

## 2018-06-06 MED ORDER — ACETAMINOPHEN 325 MG PO TABS: 650.0000 mg | ORAL_TABLET | Freq: Four times a day (QID) | ORAL | Status: DC | PRN

## 2018-06-06 MED ORDER — LIDOCAINE-PRILOCAINE 2.5-2.5 % EX CREA: 1.0000 "application " | TOPICAL_CREAM | CUTANEOUS | Status: DC | PRN

## 2018-06-06 MED ORDER — CHLORHEXIDINE GLUCONATE CLOTH 2 % EX PADS
6.0000 | MEDICATED_PAD | Freq: Every day | CUTANEOUS | Status: DC
  Administered 2018-06-06 – 2018-06-11 (×4): 6 via TOPICAL

## 2018-06-06 MED ORDER — SODIUM CHLORIDE 0.9 % IV SOLN: 100.0000 mL | INTRAVENOUS | Status: DC | PRN

## 2018-06-06 MED ORDER — NEPRO/CARBSTEADY PO LIQD
237.0000 mL | Freq: Two times a day (BID) | ORAL | Status: DC
  Administered 2018-06-07 – 2018-06-10 (×2): 237 mL via ORAL
  Filled 2018-06-06 (×12): qty 237

## 2018-06-06 MED ORDER — PENTAFLUOROPROP-TETRAFLUOROETH EX AERO: 1.0000 "application " | INHALATION_SPRAY | CUTANEOUS | Status: DC | PRN

## 2018-06-06 MED ORDER — HEPARIN SODIUM (PORCINE) 1000 UNIT/ML DIALYSIS: 1800.0000 [IU] | Freq: Once | INTRAMUSCULAR | Status: DC

## 2018-06-06 MED ORDER — INSULIN ASPART 100 UNIT/ML ~~LOC~~ SOLN
10.0000 [IU] | Freq: Once | SUBCUTANEOUS | Status: AC
  Administered 2018-06-06: 10 [IU] via SUBCUTANEOUS

## 2018-06-06 MED ORDER — DARBEPOETIN ALFA 100 MCG/0.5ML IJ SOSY
PREFILLED_SYRINGE | INTRAMUSCULAR | Status: AC
  Administered 2018-06-06: 100 ug via INTRAVENOUS
  Filled 2018-06-06: qty 0.5

## 2018-06-06 MED ORDER — RENA-VITE PO TABS
1.0000 | ORAL_TABLET | Freq: Every day | ORAL | Status: DC
  Administered 2018-06-06 – 2018-06-10 (×4): 1 via ORAL
  Filled 2018-06-06 (×5): qty 1

## 2018-06-06 MED ORDER — LIDOCAINE HCL (PF) 1 % IJ SOLN: 5.0000 mL | INTRAMUSCULAR | Status: DC | PRN

## 2018-06-06 NOTE — Progress Notes (Signed)
Pt left unit in bed, accompanied by transporter for dialysis.

## 2018-06-06 NOTE — Progress Notes (Signed)
PROGRESS NOTE    Jackie [name]  YQM:[id number] DOB: 23-Aug-1934 DOA: 06/04/2018 PCP: Mirna Mires, MD  Outpatient Specialists:   Brief Narrative:  Patient is an 83 year old African-American female, with past medical history significant for hypertension, diabetes mellitus, end-stage renal disease on hemodialysis on Tuesday, Thursday and Saturday; hyperlipidemia, anemia of chronic kidney disease, peripheral vascular disease status post left below-knee amputation, patient and family poor historians, reported frequent falls, presented to ED due to pain in her left leg after a fall.  However noted to have dry gangrene of her right foot.  Orthopedic/Dr. Lajoyce Corners consulted 11/17 and plans for right BKA.  However due to pericardial rub noted on 11/18, after discussion with nephrology, Dr. Lajoyce Corners was advised that patient will be dialyzed successively on 11/18 and 19, will need to be cleared by nephrology prior to planned surgery 11/20. Hypoglycemia has been noted during current hospitalization and as per family report, patient has had intermittent episodes of loss of consciousness during hemodialysis sessions. Insulin is currently on hold and patient was treated with hypoglycemia with IV D50 and D10 infusions.  Assessment & Plan:   Principal Problem:   Gangrene (HCC) Active Problems:   ESRF (end stage renal failure) (HCC)   HTN (hypertension), benign   Diabetes mellitus (HCC)   Right foot ulcer, with necrosis of muscle (HCC)   Atrial fibrillation (HCC)   Essential hypertension   Mixed hyperlipidemia   PAD (peripheral artery disease) (HCC)   Long term (current) use of anticoagulants [Z79.01]  .  PAD with dry gangrene of right foot (HCC) - Orthopedic/Dr. Lajoyce Corners consulted 11/17 and plans for right BKA.  However due to pericardial rub noted on 11/18, after discussion with Nephrology, Dr. Lajoyce Corners was advised that patient will be dialyzed successively on 11/18 and 19, will need to be cleared by Nephrology prior to  planned surgery 11/20. -Dr. Lajoyce Corners also advised that he does not see need for vascular consultation at this time which may become necessary if postop stump fails to heal. -Continue IV Zosyn. -CTA aorta with bifemoral 11/16: RLE with multifocal stenosis in the right SFA and at least 2 critical areas of stenosis in the right SFA.  Marland Kitchen Atrial fibrillation (HCC) -Rate controlled.   -Coumadin is on hold.  INR 2.3. -Consider heparin drip when INR gets to less than 2. -Discuss safety of anticoagulation with cardiology team considering recurrent falls  Recurrent falls: Physical therapy consult postop for recommendations.  Diabetes mellitus with hypoglycemia: A1c 5.4 on 10/8.  Insulins were held.  She was treated with IV D50 followed by D10 infusion until 11/18 morning.  D10 now discontinued.  Encourage diet as tolerated.  No further hypoglycemic episodes since 11/17 morning.  Discussed with RN, monitor CBGs closely and treat as per protocol.  . ESRD on TTS HD -Nephrology follow-up appreciated.  I discussed with Dr. Signe Colt.  Plans are for HD today and tomorrow.  Please see discussion below regarding pericardial rub.  Patient missed HD on 11/16.  Uremic pericarditis:  Pericardial rub noted on 11/18, likely due to uremic pericarditis although patient asymptomatic. -Dr. Signe Colt plans to dialyze her on 11/18 and 19 and if no resolution of pericardial rub, recommends TTE and cardiology consultation.  This needs to resolve prior to OR. -TTE 08/02/2017: LVEF 55-60%.  Moderate MR, mild to moderate TR and PA peak pressure 65 mmHg.  Anemia of chronic kidney disease: Stable.  Thrombocytopenia Unclear etiology.  Relatively stable.  Marland Kitchen PAD (peripheral artery disease) (HCC) -See above -For right  BKA on Wednesday.   Essential hypertension: Controlled.   DVT prophylaxis: Patient was on Coumadin, currently held but INR still therapeutic. Code Status: Full Family Communication: None at bedside Disposition  Plan: To be determined pending further interventions as indicated above.   Consultants:   Orthopedic team.  Nephrology  Procedures:   HD  Antimicrobials:   IV Zosyn   Subjective: Patient interviewed and examined along with RN at bedside.  Denies complaints.  States that her right foot issues started off with a blister followed by current black discoloration.  Denies foot pain.  No chest pain, palpitations or dyspnea reported.  Objective: Vitals:   06/05/18 1900 06/05/18 2300 06/06/18 0300 06/06/18 1018  BP: 129/79 111/75 (!) 98/46   Pulse: (!) 118 (!) 104 (!) 102   Resp: (!) 28 (!) 21 17   Temp: 99.1 F (37.3 C) 98.7 F (37.1 C) 97.9 F (36.6 C) 97.7 F (36.5 C)  TempSrc: Oral Oral Oral Oral  SpO2: 96% 100% 100%   Weight:        Intake/Output Summary (Last 24 hours) at 06/06/2018 1250 Last data filed at 06/05/2018 1700 Gross per 24 hour  Intake 480 ml  Output -  Net 480 ml   Filed Weights   06/04/18 1900  Weight: 56.7 kg    Examination:  General exam: Pleasant elderly female, moderately built and nourished lying comfortably propped up in bed. Respiratory system: Clear to auscultation.  No increased work of breathing. Cardiovascular system: S1 and S2 heard, irregularly irregular.  No JVD.  Pericardial rub + +.?  Short systolic murmur 2/6.  No pedal edema.  Telemetry personally reviewed and shows A. fib with ventricular rate in the 100-110's Gastrointestinal system: Abdomen is nondistended, soft and nontender.  Normal bowel sounds heard. Central nervous system: Alert and oriented. No focal neurological deficits.  Stable. Extremities: Left BKA with healed stump.  Right toes with dry gangrene.  Absent right dorsalis pedis and posterior tibial pulsations. Skin: Dark discoloration of right lower leg.  Data Reviewed: I have personally reviewed following labs and imaging studies  CBC: Recent Labs  Lab 06/02/18 1905 06/04/18 1055 06/05/18 0258 06/06/18 0222    WBC 11.6* 8.5 8.3 9.4  NEUTROABS 9.5* 7.3  --  7.9*  HGB 10.7* 9.6* 8.8* 9.1*  HCT 33.8* 30.2* 28.0* 29.7*  MCV 90.6 89.1 88.1 88.9  PLT 146* 139* 137* 132*   Basic Metabolic Panel: Recent Labs  Lab 06/02/18 1905 06/04/18 1055 06/05/18 0258 06/06/18 0222  NA 135 138 138 134*  K 3.9 3.2* 3.3* 3.9  CL 98 99 100 97*  CO2 23 26 23 24   GLUCOSE 78 139* 132* 161*  BUN 8 28* 37* 42*  CREATININE 2.68* 5.51* 6.51* 7.55*  CALCIUM 8.5* 9.1 9.0 9.0  MG  --   --   --  2.2  PHOS  --   --   --  5.3*   GFR: Estimated Creatinine Clearance: 5.2 mL/min (A) (by C-G formula based on SCr of 7.55 mg/dL (H)). Liver Function Tests: Recent Labs  Lab 06/02/18 1905 06/04/18 1055 06/06/18 0222  AST 36 28  --   ALT 17 17  --   ALKPHOS 71 70  --   BILITOT 2.3* 1.3*  --   PROT 7.0 6.6  --   ALBUMIN 2.5* 2.4* 2.1*   Coagulation Profile: Recent Labs  Lab 06/03/18 06/04/18 1055 06/05/18 0258 06/05/18 1450  INR 4.5* 3.02 2.87 2.33   Cardiac Enzymes: Recent Labs  Lab 06/02/18 1905  TROPONINI 0.04*   CBG: Recent Labs  Lab 06/05/18 1733 06/05/18 2010 06/06/18 0047 06/06/18 0802 06/06/18 1155  GLUCAP 112* 133* 162* 135* 144*     Recent Results (from the past 240 hour(s))  Culture, blood (Routine X 2) w Reflex to ID Panel     Status: None (Preliminary result)   Collection Time: 06/04/18 10:55 AM  Result Value Ref Range Status   Specimen Description BLOOD RIGHT FOREARM  Final   Special Requests   Final    BOTTLES DRAWN AEROBIC AND ANAEROBIC Blood Culture adequate volume   Culture   Final    NO GROWTH 2 DAYS Performed at [facility], [address] [place], [place] [address]    Report Status PENDING  Incomplete  Culture, blood (Routine X 2) w Reflex to ID Panel     Status: None (Preliminary result)   Collection Time: 06/04/18 11:15 AM  Result Value Ref Range Status   Specimen Description BLOOD RIGHT HAND  Final   Special Requests   Final    BOTTLES DRAWN AEROBIC AND  ANAEROBIC Blood Culture adequate volume   Culture   Final    NO GROWTH 2 DAYS Performed at [facility], [address] [place], [place] [address]    Report Status PENDING  Incomplete  MRSA PCR Screening     Status: None   Collection Time: 06/04/18  7:05 PM  Result Value Ref Range Status   MRSA by PCR NEGATIVE NEGATIVE Final    Comment:        The GeneXpert MRSA Assay (FDA approved for NASAL specimens only), is one component of a comprehensive MRSA colonization surveillance program. It is not intended to diagnose MRSA infection nor to guide or monitor treatment for MRSA infections. Performed at [facility], [address], [place], [place] [address]          Radiology Studies: Ct Angio Ao+bifem W & Or Wo Contrast  Result Date: 06/05/2018 CLINICAL DATA:  83 year old with abnormal right foot. Gas gangrene. Previous arteriogram demonstrated areas of stenosis in the right SFA that were treated with arthrectomy and balloon angioplasty. Known occlusion of the right runoff vessels. EXAM: CT ANGIOGRAPHY OF ABDOMINAL AORTA WITH ILIOFEMORAL RUNOFF TECHNIQUE: Multidetector CT imaging of the abdomen, pelvis and lower extremities was performed using the standard protocol during bolus administration of intravenous contrast. Multiplanar CT image reconstructions and MIPs were obtained to evaluate the vascular anatomy. CONTRAST:  ISOVUE-370 IOPAMIDOL (ISOVUE-370) INJECTION 76% COMPARISON:  Arteriogram report from 12/01/2017 FINDINGS: VASCULAR Aorta: Diffuse atherosclerotic disease in the abdominal aorta without aneurysm or dissection. Many of the images are limited due to motion artifact. Celiac: Mild stenosis in the proximal celiac trunk measuring 40-50%. Celiac trunk branches are heavily calcified. SMA: Patent without evidence of aneurysm, dissection, vasculitis or significant stenosis. Renals: Mixed plaque at the origin of the right renal artery causing mild-to-moderate  stenosis. Large amount of motion artifact associated the renal arteries. No significant stenosis in the proximal left renal artery. IMA: IMA is heavily calcified but appears to be patent. RIGHT Lower Extremity Inflow: Right iliac arteries are heavily calcified. Common, internal and external iliac arteries are patent. No significant stenosis in the right common and right external iliac artery. Outflow: Right common femoral artery is widely patent with mild disease. Profunda femoral arteries are patent. The right SFA is heavily calcified. Proximal right SFA is patent. Focal high-grade stenosis in the mid right SFA on  sequence 6, image 290. There is another focus of critical stenosis or occlusion in the distal right SFA on image 308. Right popliteal artery is diffusely diseased with segmental areas of stenosis. Runoff: The runoff vessels are heavily calcified and difficult to evaluate lumen patency. Right runoff vessels are presumed to be occluded based on the previous angiogram report. Limited evaluation of the arteries at the ankle due to vessel calcifications. LEFT Lower Extremity Inflow: Left iliac arteries are heavily calcified but patent. No significant stenosis in the left common or external iliac artery. Outflow: Atherosclerotic calcifications in left common femoral artery without significant stenosis. Left profunda femoral arteries are patent. Left SFA is heavily calcified and there is occlusion of the mid and distal SFA. Left popliteal artery appears to be occluded. Runoff: Below the knee amputation. Runoff vessels are heavily calcified and appear to be occluded. Veins: No obvious venous abnormality within the limitations of this arterial phase study. Review of the MIP images confirms the above findings. NON-VASCULAR Lower chest: Calcified granuloma at the right lung base. Small left pleural effusion. Heart is enlarged. Contrast refluxes into the hepatic veins and compatible with increased right heart  pressures. Hepatobiliary: Limited evaluation due to motion artifact. Gallbladder is mildly distended with high-density material compatible with stones. No gross abnormality to the liver. Pancreas: Poorly characterized without gross abnormality. Spleen: Normal in size without focal abnormality. Adrenals/Urinary Tract: Mild fullness in left adrenal gland. Excessive motion artifact in the kidneys. Negative for hydronephrosis. Urinary bladder is unremarkable. Stomach/Bowel: Stomach and bowel structures are unremarkable without dilatation. Excessive motion limits evaluation for inflammatory changes. Lymphatic: No significant lymph node enlargement in the abdomen or pelvis. Reproductive: Extensive vascularity around the uterus and difficult to exclude uterine calcifications. Limited evaluation of the uterus and adnexal structures on this examination. Other: Small amount of free fluid in the pelvis. Negative for free air. Musculoskeletal: Left below the knee amputation. No significant bone abnormalities in the right lower extremity. Diffuse osteopenia. IMPRESSION: VASCULAR 1. Right lower extremity outflow disease with multifocal stenosis in the right SFA. At least 2 critical areas of stenosis in the right SFA. 2. Limited evaluation of the runoff vessels due to severe calcifications. However, the right runoff vessels appear to be occluded and this corresponds with the previous arteriogram report. 3. Left outflow disease demonstrated by occlusion of the left mid and distal SFA and left popliteal artery. 4. Aorta and iliac arteries are heavily calcified without significant inflow stenosis. 5. Stenosis in the right renal artery. Mild stenosis involving the celiac trunk. NON-VASCULAR 1. Limited evaluation of the intra-abdominal structures due to excessive motion artifact. 2. Small amount of free fluid in the pelvis is nonspecific. 3. Small left pleural effusion. 4. Cholelithiasis. Electronically Signed   By: Richarda Overlie M.D.    On: 06/05/2018 08:33        Scheduled Meds: . [START ON 06/07/2018] calcitRIOL  2 mcg Oral Q T,Th,Sa-HD  . calcium acetate  2,001 mg Oral TID WC  . Chlorhexidine Gluconate Cloth  6 each Topical Q0600  . cinacalcet  30 mg Oral Q supper  . darbepoetin (ARANESP) injection - DIALYSIS  100 mcg Intravenous Q Mon-HD  . [START ON 06/14/2018] darbepoetin (ARANESP) injection - DIALYSIS  100 mcg Intravenous Q Tue-HD  . feeding supplement (NEPRO CARB STEADY)  237 mL Oral BID BM  . heparin  1,800 Units Dialysis Once in dialysis  . multivitamin  1 tablet Oral QHS  . sevelamer carbonate  2.4 g Oral TID WC  Continuous Infusions: . sodium chloride    . sodium chloride    . piperacillin-tazobactam (ZOSYN)  IV 3.375 g (06/06/18 1011)     LOS: 2 days    Marcellus Scott, MD, FACP, [facility]. [facility] Pager [phone]  If 7PM-7AM, please contact night-coverage [email or web address] Password [email or web address] 06/06/2018, 1:18 PM

## 2018-06-06 NOTE — Progress Notes (Signed)
Dextrose 10% infusion to R A.C. infiltrated. IV removed, elevated arm and hand, then placed ice pack. New IV was placed. I notified provider made him aware. No further orders. I will continue to monitor.

## 2018-06-06 NOTE — Progress Notes (Signed)
Pt returned form dialysis.

## 2018-06-06 NOTE — Progress Notes (Signed)
Initial Nutrition Assessment  DOCUMENTATION CODES:   Not applicable  INTERVENTION:   - Add Nepro BID in vanilla - Add Rena-vit daily - Recommend daily iron supplement, given PICA and low Hbg   NUTRITION DIAGNOSIS:   Increased nutrient needs related to acute illness, chronic illness as evidenced by estimated needs.   GOAL:   Patient will meet greater than or equal to 90% of their needs   MONITOR:   PO intake, Supplement acceptance, Labs, Skin  REASON FOR ASSESSMENT:   Malnutrition Screening Tool    ASSESSMENT:   83 yo female, admitted with dry gangrene of R foot. PMH significant for anemia, diabetes melitus, ESRD on HD Tues/Thurs/Sat, L BKA, HLD, HTN.   Labs: sodium 134, chloride 97, glucose 161, Hgb A1c 5.4% 04/2018, BUN 43, Creatinine 7.55, phosphorus 5.3, GFR 4%/5%, Hgb 9.1, Hct 29.7% Meds: Phoslo 2001 mg TID, Sensipar 30 mg daily with dinner, Renvela 2.4g pkt TID with meals  Pt resting at time of visit. Reports decent appetite, but describes difficulty on HD days. Used to bring a sandwich to HD, but stopped when she began throwing up during treatment. Reports liking peanut butter crackers and seems interested in making an effort to resuming snacks during HD.  Typically eats 2.5 meals daily and follows a low sodium diet. Takes renal MVI daily. For breakfast this morning, pt ate a blueberry muffin and 3 pieces of bacon.   States 125# (56.7 kg) is UBW, and also reports some recent weight loss (unable to say how much).   Denies nausea, diarrhea or constipation, or difficulty chewing or swallowing. Reports eating crushed ice - may indicate anemia. Pt would benefit from iron supplement, especially given low Hbg.  Encouraged pt to include protein foods with every meal, and to eat those foods first if she is not feeling very hungry. Pt amenable to Nepro BID in vanilla.    NUTRITION - FOCUSED PHYSICAL EXAM:    Most Recent Value  Orbital Region  Mild depletion  Upper  Arm Region  No depletion  Thoracic and Lumbar Region  No depletion  Buccal Region  Mild depletion  Temple Region  Mild depletion  Clavicle Bone Region  No depletion  Clavicle and Acromion Bone Region  No depletion  Scapular Bone Region  No depletion  Dorsal Hand  No depletion  Patellar Region  Mild depletion  Anterior Thigh Region  Mild depletion  Posterior Calf Region  Mild depletion  Edema (RD Assessment)  None  Hair  Other (Comment)  Eyes  Reviewed  Mouth  Reviewed  Skin  Reviewed  Nails  Reviewed      Diet Order:   Diet Order            Diet renal/carb modified with fluid restriction Diet-HS Snack? Nothing; Fluid restriction: 1200 mL Fluid; Room service appropriate? Yes; Fluid consistency: Thin  Diet effective now              EDUCATION NEEDS:   No education needs have been identified at this time  Skin:  Skin Assessment: Skin Integrity Issues: Skin Integrity Issues:: Other (Comment) Other: gangrene of R foot  Last BM:  PTA  Height:  Ht Readings from Last 1 Encounters:  06/02/18 5\' 8"  (1.727 m)    Weight:  Wt Readings from Last 1 Encounters:  06/04/18 56.7 kg    Ideal Body Weight:  59.8 kg(adjusted for L BKA)  BMI:  Body mass index is 19.01 kg/m.  Estimated Nutritional Needs:   Kcal:  4098-1191 calories daily (30-35 kcal/kg IBW)  Protein:  72-90 g protein daily (1.2-1.5 g/kg IBW)  Fluid:  >/= 1.5 L daily or per MD discretion  Jolaine Artist, MS, RDN, LDN On-call pager: [phone]

## 2018-06-06 NOTE — Procedures (Signed)
Patient seen and examined on Hemodialysis. Qb 400 mL min via AVG, UF goal 1L.   Treatment adjusted as needed.  Bufford ButtnerElizabeth Cassidie Veiga MD [facility] pgr [phone] 3:20 PM

## 2018-06-06 NOTE — Progress Notes (Signed)
Elevated CBG 429 notified the provider order received for 10 units insulin x 1 dose. Recheck CBG she is 166. I will continue to monitor.

## 2018-06-06 NOTE — Progress Notes (Addendum)
[facility] Progress Note   Assessment/ Plan:   Dialysis Orders: Center: [facility]   on TTS . EDW 55.5 HD Bath 2k, 2.25 ca  Time 4hr Heparin 1800. Access LUA AVGG     Calcitriol 2 mcg po /HD  Mircera 50 mg q 2 wks (last 05/14/18)  Assessment/Plan 1. RLE gangrene: on antibiotics (Zosyn) per primary, ortho consulted.  Plan for BKA this admit 2. + Pericardial rub- most likely d/t underdialysis- BUN of 42 this AM which isn't terribly high but unmeasured uremic toxins likely contributing.  HD today and tomorrow, if no improvement would rec TTE and cardiology c/s.  Need to resolve before OR. 3. ESRD -  HD TTS Schedule, Missed hd 11/16.  HD today off schedule and tomorrow (regular day) 4. Atrial Fibrillation - inr 2.87.  Coumadin as OP 5. Hypertension/volume  - bp and vol stable, no op bp meds  6. Anemia  - hgb 9.6  Give on HD tomor then q Tuesday  Hd starting 11/26 7. Metabolic bone disease - ca corec 10.3  On sensipar 30 mg q d, phoslo 3 ac and Renvela listed as binders .po vit d on hd.  Will switch no non-Ca based binder 8. DM type 2 - per admit  9. Nutrition - alb 2.4 , renal carb mod diet ,nepro  Supplement, renal vit 10. Dispo: pending  Subjective:    No complaints this AM.  No pain in RLE; ortho has seen and rec BKA Wednesday but pt has + pericardial rub on exam (missed OP HD 11/16 and also misses the occasional OP session according to dialysis center notes).  Will arrange for HD today and tomorrow.     Objective:   BP (!) 98/46 (BP Location: Right Wrist)   Pulse (!) 102   Temp 97.7 F (36.5 C) (Oral)   Resp 17   Wt 56.7 kg   SpO2 100%   BMI 19.01 kg/m   Physical Exam: Gen: elderly woman, NAD, lying in bed CVS: irregular + pericardial rub heard throughout precordium but loudest at LUSB Resp: clear bilaterally [id number] nontender Ext: RLE with gangrenous toes and foot ACCESS: LUE AVG + T/B  Labs: BMET Recent Labs  Lab 06/02/18 1905 06/04/18 1055  06/05/18 0258 06/06/18 0222  NA 135 138 138 134*  K 3.9 3.2* 3.3* 3.9  CL 98 99 100 97*  CO2 23 26 23 24   GLUCOSE 78 139* 132* 161*  BUN 8 28* 37* 42*  CREATININE 2.68* 5.51* 6.51* 7.55*  CALCIUM 8.5* 9.1 9.0 9.0  PHOS  --   --   --  5.3*   CBC Recent Labs  Lab 06/02/18 1905 06/04/18 1055 06/05/18 0258 06/06/18 0222  WBC 11.6* 8.5 8.3 9.4  NEUTROABS 9.5* 7.3  --  7.9*  HGB 10.7* 9.6* 8.8* 9.1*  HCT 33.8* 30.2* 28.0* 29.7*  MCV 90.6 89.1 88.1 88.9  PLT 146* 139* 137* 132*    @IMGRELPRIORS @ Medications:    . [START ON 06/07/2018] calcitRIOL  2 mcg Oral Q T,Th,Sa-HD  . calcium acetate  2,001 mg Oral TID WC  . Chlorhexidine Gluconate Cloth  6 each Topical Q0600  . cinacalcet  30 mg Oral Q supper  . darbepoetin (ARANESP) injection - DIALYSIS  100 mcg Intravenous Q Mon-HD  . [START ON 06/14/2018] darbepoetin (ARANESP) injection - DIALYSIS  100 mcg Intravenous Q Tue-HD  . heparin  1,800 Units Dialysis Once in dialysis  . sevelamer carbonate  2.4 g Oral TID WC  Bufford ButtnerElizabeth Ellawyn Wogan, MD [facility] pgr [phone] 06/06/2018, 11:25 AM

## 2018-06-07 DIAGNOSIS — R55 Syncope and collapse: Secondary | ICD-10-CM

## 2018-06-07 DIAGNOSIS — I319 Disease of pericardium, unspecified: Secondary | ICD-10-CM

## 2018-06-07 DIAGNOSIS — I4811 Longstanding persistent atrial fibrillation: Secondary | ICD-10-CM

## 2018-06-07 DIAGNOSIS — I10 Essential (primary) hypertension: Secondary | ICD-10-CM

## 2018-06-07 DIAGNOSIS — E11649 Type 2 diabetes mellitus with hypoglycemia without coma: Secondary | ICD-10-CM

## 2018-06-07 DIAGNOSIS — Z7901 Long term (current) use of anticoagulants: Secondary | ICD-10-CM

## 2018-06-07 DIAGNOSIS — E782 Mixed hyperlipidemia: Secondary | ICD-10-CM

## 2018-06-07 DIAGNOSIS — Z01810 Encounter for preprocedural cardiovascular examination: Secondary | ICD-10-CM

## 2018-06-07 LAB — CBC
HCT: 30.2 % — ABNORMAL LOW (ref 36.0–46.0)
Hemoglobin: 9.2 g/dL — ABNORMAL LOW (ref 12.0–15.0)
MCH: 27.1 pg (ref 26.0–34.0)
MCHC: 30.5 g/dL (ref 30.0–36.0)
MCV: 89.1 fL (ref 80.0–100.0)
PLATELETS: 136 10*3/uL — AB (ref 150–400)
RBC: 3.39 MIL/uL — ABNORMAL LOW (ref 3.87–5.11)
RDW: 17.9 % — AB (ref 11.5–15.5)
WBC: 10.7 10*3/uL — AB (ref 4.0–10.5)
nRBC: 0 % (ref 0.0–0.2)

## 2018-06-07 LAB — PROTIME-INR
INR: 1.92
PROTHROMBIN TIME: 21.7 s — AB (ref 11.4–15.2)

## 2018-06-07 LAB — GLUCOSE, CAPILLARY
GLUCOSE-CAPILLARY: 184 mg/dL — AB (ref 70–99)
GLUCOSE-CAPILLARY: 126 mg/dL — AB (ref 70–99)
GLUCOSE-CAPILLARY: 95 mg/dL (ref 70–99)
Glucose-Capillary: 26 mg/dL — CL (ref 70–99)
Glucose-Capillary: 143 mg/dL — ABNORMAL HIGH (ref 70–99)
Glucose-Capillary: 103 mg/dL — ABNORMAL HIGH (ref 70–99)

## 2018-06-07 LAB — BASIC METABOLIC PANEL
Anion gap: 10 (ref 5–15)
BUN: 21 mg/dL (ref 8–23)
CO2: 28 mmol/L (ref 22–32)
Calcium: 8.4 mg/dL — ABNORMAL LOW (ref 8.9–10.3)
Chloride: 98 mmol/L (ref 98–111)
Creatinine, Ser: 4.68 mg/dL — ABNORMAL HIGH (ref 0.44–1.00)
GFR calc Af Amer: 9 mL/min — ABNORMAL LOW (ref >60)
GFR calc non Af Amer: 8 mL/min — ABNORMAL LOW (ref >60)
Glucose, Bld: 35 mg/dL — CL (ref 70–99)
Potassium: 3.7 mmol/L (ref 3.5–5.1)
Sodium: 136 mmol/L (ref 135–145)

## 2018-06-07 LAB — HEPARIN LEVEL (UNFRACTIONATED): Heparin Unfractionated: 0.11 IU/mL — ABNORMAL LOW (ref 0.30–0.70)

## 2018-06-07 MED ORDER — DIPHENHYDRAMINE HCL 25 MG PO CAPS
ORAL_CAPSULE | ORAL | Status: AC
  Filled 2018-06-07: qty 1

## 2018-06-07 MED ORDER — CHLORHEXIDINE GLUCONATE 4 % EX LIQD
60.0000 mL | Freq: Once | CUTANEOUS | Status: AC
  Administered 2018-06-08: 4 via TOPICAL
  Filled 2018-06-07: qty 60

## 2018-06-07 MED ORDER — DEXTROSE 50 % IV SOLN
INTRAVENOUS | Status: AC
  Administered 2018-06-07: 50 mL
  Filled 2018-06-07: qty 50

## 2018-06-07 MED ORDER — DIPHENHYDRAMINE HCL 25 MG PO CAPS
25.0000 mg | ORAL_CAPSULE | Freq: Once | ORAL | Status: AC
  Administered 2018-06-07: 25 mg via ORAL

## 2018-06-07 MED ORDER — HEPARIN (PORCINE) 25000 UT/250ML-% IV SOLN
1050.0000 [IU]/h | INTRAVENOUS | Status: DC
  Administered 2018-06-07: 800 [IU]/h via INTRAVENOUS
  Filled 2018-06-07: qty 250

## 2018-06-07 NOTE — Procedures (Signed)
Patient seen and examined on Hemodialysis. Qb 400 mL min via AVG, UF goal 500 mL.   Treatment adjusted as needed.  Bufford ButtnerElizabeth Jeffrie Stander MD [facility] pgr [phone] 1:41 PM

## 2018-06-07 NOTE — Progress Notes (Signed)
ANTICOAGULATION CONSULT NOTE - Initial Consult  Pharmacy Consult for heparin Indication: atrial fibrillation  No Known Allergies  Patient Measurements: Weight: 125 lb (56.7 kg) Heparin Dosing Weight: 56.7 kg  Vital Signs: Temp: 98 F (36.7 C) (11/19 1151) Temp Source: Oral (11/19 1151) BP: 119/42 (11/19 1151) Pulse Rate: 100 (11/19 1151)  Labs: Recent Labs    06/05/18 0258 06/05/18 1450 06/06/18 0222 06/07/18 0506 06/07/18 0518  HGB 8.8*  --  9.1* 9.2*  --   HCT 28.0*  --  29.7* 30.2*  --   PLT 137*  --  132* 136*  --   LABPROT 29.7* 25.3*  --  21.7*  --   INR 2.87 2.33  --  1.92  --   CREATININE 6.51*  --  7.55*  --  4.68*    Estimated Creatinine Clearance: 8.4 mL/min (A) (by C-G formula based on SCr of 4.68 mg/dL (H)).   Medical History: Past Medical History:  Diagnosis Date  . Anemia   . Arthritis   . Diabetes mellitus   . End-stage renal disease on hemodialysis (HCC)    dialysis T/Th/Sa  . Hx of BKA, left (HCC)   . Hyperlipidemia   . Hypertension   . Pneumonia     Medications:  Scheduled:  . calcitRIOL  2 mcg Oral Q T,Th,Sa-HD  . calcium acetate  2,001 mg Oral TID WC  . [START ON 06/08/2018] chlorhexidine  60 mL Topical Once  . Chlorhexidine Gluconate Cloth  6 each Topical Q0600  . cinacalcet  30 mg Oral Q supper  . [START ON 06/14/2018] darbepoetin (ARANESP) injection - DIALYSIS  100 mcg Intravenous Q Tue-HD  . feeding supplement (NEPRO CARB STEADY)  237 mL Oral BID BM  . heparin  1,800 Units Dialysis Once in dialysis  . multivitamin  1 tablet Oral QHS  . sevelamer carbonate  2.4 g Oral TID WC    Assessment: 83 yof awaiting plans for CKA on 11/20 - on warfarin PTA. Home regimen is 5 mg daily (last dose on 11/15). INR on admission 3.02. Received 1 dose vitamin K 5 mg PO on 11/16. Known ESRD patient.   INR is 1.92.  Hgb 9.2, plt 136. No s/sx of bleeding.    Goal of Therapy:  Heparin level 0.3-0.7 units/ml Monitor platelets by anticoagulation  protocol: Yes   Plan:  Start heparin infusion at 800 units/hr Check anti-Xa level in 8 hours and daily while on heparin Continue to monitor H&H and platelets  Sherron MondayKimberly Teniya Filter, PharmD Clinical Pharmacist  Pager: [phone] Phone: [phone] 06/07/2018,1:12 PM

## 2018-06-07 NOTE — Progress Notes (Signed)
[facility] Progress Note   Assessment/ Plan:   Dialysis Orders: Center: [facility]   on TTS . EDW 55.5 HD Bath 2k, 2.25 ca  Time 4hr Heparin 1800. Access LUA AVGG     Calcitriol 2 mcg po /HD  Mircera 50 mg q 2 wks (last 05/14/18)  Assessment/Plan 1. RLE gangrene: on antibiotics (Zosyn) per primary, ortho consulted.  Plan for BKA this admit 2. + Pericardial rub- most likely d/t underdialysis- BUN of 42 this AM which isn't terribly high but unmeasured uremic toxins likely contributing.  HD 11/18 and today 11/19.  Today rub is still present but slightly improved.  Would rec TTE and cardiology c/s to treat all etiologies of possible pericarditis before BKA.  Need to resolve before OR. 3. ESRD -  HD TTS Schedule, Missed hd 11/16.  HD today off schedule and tomorrow (regular day) 4. Atrial Fibrillation - inr 2.87.  Coumadin as OP 5. Hypertension/volume  - bp and vol stable, no op bp meds  6. Anemia  - hgb 9.6  ESA due 11/26 7. Metabolic bone disease - ca corec 10.3  On sensipar 30 mg q d, phoslo 3 ac and Renvela listed as binders .po vit d on hd.  Will switch no non-Ca based binder 8. DM type 2 - per admit  9. Nutrition - alb 2.4 , renal carb mod diet ,nepro  Supplement, renal vit 10. Dispo: pending  Subjective:    No complaints this AM.  Had hypoglycemia to blood glucose of 26 overnight, responded to D50.  Also with lower BP in the 80s-90s systolic.  HD yesterday, essentially no UF achieved.     Objective:   BP (!) 83/73   Pulse 96   Temp 98 F (36.7 C) (Oral)   Resp 18   Wt 56.7 kg   SpO2 100%   BMI 19.01 kg/m   Physical Exam: Gen: elderly woman, NAD, lying in bed CVS: irregular + pericardial rub heard throughout precordium but loudest at LUSB, slightly improved today Resp: clear bilaterally [id number] nontender Ext: RLE with gangrenous toes and foot ACCESS: LUE AVG + T/B  Labs: BMET Recent Labs  Lab 06/02/18 1905 06/04/18 1055 06/05/18 0258  06/06/18 0222 06/07/18 0518  NA 135 138 138 134* 136  K 3.9 3.2* 3.3* 3.9 3.7  CL 98 99 100 97* 98  CO2 23 26 23 24 28   GLUCOSE 78 139* 132* 161* 35*  BUN 8 28* 37* 42* 21  CREATININE 2.68* 5.51* 6.51* 7.55* 4.68*  CALCIUM 8.5* 9.1 9.0 9.0 8.4*  PHOS  --   --   --  5.3*  --    CBC Recent Labs  Lab 06/02/18 1905 06/04/18 1055 06/05/18 0258 06/06/18 0222 06/07/18 0506  WBC 11.6* 8.5 8.3 9.4 10.7*  NEUTROABS 9.5* 7.3  --  7.9*  --   HGB 10.7* 9.6* 8.8* 9.1* 9.2*  HCT 33.8* 30.2* 28.0* 29.7* 30.2*  MCV 90.6 89.1 88.1 88.9 89.1  PLT 146* 139* 137* 132* 136*    @IMGRELPRIORS @ Medications:    . calcitRIOL  2 mcg Oral Q T,Th,Sa-HD  . calcium acetate  2,001 mg Oral TID WC  . Chlorhexidine Gluconate Cloth  6 each Topical Q0600  . cinacalcet  30 mg Oral Q supper  . [START ON 06/14/2018] darbepoetin (ARANESP) injection - DIALYSIS  100 mcg Intravenous Q Tue-HD  . feeding supplement (NEPRO CARB STEADY)  237 mL Oral BID BM  . heparin  1,800 Units Dialysis Once in dialysis  .  multivitamin  1 tablet Oral QHS  . sevelamer carbonate  2.4 g Oral TID WC     Bufford ButtnerElizabeth Jaislyn Blinn, MD [facility] pgr [phone] 06/07/2018, 10:41 AM

## 2018-06-07 NOTE — Care Management Important Message (Signed)
Important Message  Patient Details  Name: Jackie [name] MRN: [id number] Date of Birth: 12/04/1934   Medicare Important Message Given:  Yes    Denver Harder 06/07/2018, 1:41 PM

## 2018-06-07 NOTE — Plan of Care (Signed)
  Problem: Health Behavior/Discharge Planning: Goal: Ability to manage health-related needs will improve Outcome: Progressing   Problem: Clinical Measurements: Goal: Ability to maintain clinical measurements within normal limits will improve Outcome: Progressing Goal: Will remain free from infection Outcome: Progressing Goal: Diagnostic test results will improve Outcome: Progressing Goal: Respiratory complications will improve Outcome: Progressing Goal: Cardiovascular complication will be avoided Outcome: Progressing   Problem: Activity: Goal: Risk for activity intolerance will decrease Outcome: Progressing   Problem: Elimination: Goal: Will not experience complications related to bowel motility Outcome: Progressing Goal: Will not experience complications related to urinary retention Outcome: Progressing   Problem: Safety: Goal: Ability to remain free from injury will improve Outcome: Progressing   Problem: Skin Integrity: Goal: Risk for impaired skin integrity will decrease Outcome: Progressing   

## 2018-06-07 NOTE — Progress Notes (Signed)
Inpatient Diabetes Program Recommendations  AACE/ADA: New Consensus Statement on Inpatient Glycemic Control (2015)  Target Ranges:  Prepandial:   less than 140 mg/dL      Peak postprandial:   less than 180 mg/dL (1-2 hours)      Critically ill patients:  140 - 180 mg/dL   Lab Results  Component Value Date   GLUCAP 126 (H) 06/07/2018   HGBA1C 5.4 04/26/2018    Review of Glycemic Control Results for Jackie [name], Jackie [name] (MRN [id number]) as of 06/07/2018 08:56  Ref. Range 06/07/2018 05:04 06/07/2018 05:06 06/07/2018 05:22 06/07/2018 07:35  Glucose-Capillary Latest Ref Range: 70 - 99 mg/dL 28 (LL) 26 (LL) 213184 (H) 126 (H)   Diabetes history: Type 2 DM Outpatient Diabetes medications: Levemir 8 units QD Current orders for Inpatient glycemic control: none  Inpatient Diabetes Program Recommendations:    Noted significant hypoglycemia of 28 mg/dL, following administration of Novolog 10 units x 1 for a CBG of 429 mg/dL. Patient is sensitive to short acting insulin and CBG of 429 mg/dL does not match baseline (even post prandially) and was not compared with serum (validity?). Would be hesitant to administer >5 units of short acting per injection given inpatient course. Will continue to follow.   Thanks, Lujean RaveLauren Aronda Burford, MSN, RNC-OB Diabetes Coordinator [phone] (8a-5p)

## 2018-06-07 NOTE — Progress Notes (Signed)
I notified the provider with a update about HR elevated 120'-130 and SBP 80's . Her CBG was 26 it was treated and came up to 184. I will continue to monitor and update provider.

## 2018-06-07 NOTE — Progress Notes (Signed)
ANTICOAGULATION CONSULT NOTE   Pharmacy Consult for Heparin (warfarin on hold) Indication: atrial fibrillation  No Known Allergies  Patient Measurements: Height: 5\' 8"  (172.7 cm) Weight: 124 lb 9 oz (56.5 kg) IBW/kg (Calculated) : 63.9 Heparin Dosing Weight: 56.7 kg  Vital Signs: Temp: 98.1 F (36.7 C) (11/19 2300) Temp Source: Oral (11/19 2300) BP: 104/22 (11/19 2300) Pulse Rate: 109 (11/19 2300)  Labs: Recent Labs    06/05/18 0258 06/05/18 1450 06/06/18 0222 06/07/18 0506 06/07/18 0518 06/07/18 2200  HGB 8.8*  --  9.1* 9.2*  --   --   HCT 28.0*  --  29.7* 30.2*  --   --   PLT 137*  --  132* 136*  --   --   LABPROT 29.7* 25.3*  --  21.7*  --   --   INR 2.87 2.33  --  1.92  --   --   HEPARINUNFRC  --   --   --   --   --  0.11*  CREATININE 6.51*  --  7.55*  --  4.68*  --     Estimated Creatinine Clearance: 8.4 mL/min (A) (by C-G formula based on SCr of 4.68 mg/dL (H)).   Medical History: Past Medical History:  Diagnosis Date  . Anemia   . Arthritis   . Diabetes mellitus   . End-stage renal disease on hemodialysis (HCC)    dialysis T/Th/Sa  . Hx of BKA, left (HCC)   . Hyperlipidemia   . Hypertension   . Pneumonia     Medications:  Scheduled:  . calcitRIOL  2 mcg Oral Q T,Th,Sa-HD  . calcium acetate  2,001 mg Oral TID WC  . [START ON 06/08/2018] chlorhexidine  60 mL Topical Once  . Chlorhexidine Gluconate Cloth  6 each Topical Q0600  . cinacalcet  30 mg Oral Q supper  . [START ON 06/14/2018] darbepoetin (ARANESP) injection - DIALYSIS  100 mcg Intravenous Q Tue-HD  . feeding supplement (NEPRO CARB STEADY)  237 mL Oral BID BM  . heparin  1,800 Units Dialysis Once in dialysis  . multivitamin  1 tablet Oral QHS  . sevelamer carbonate  2.4 g Oral TID WC    Assessment: 83 yof awaiting plans for CKA on 11/20 - on warfarin PTA. Home regimen is 5 mg daily (last dose on 11/15). INR on admission 3.02. Received 1 dose vitamin K 5 mg PO on 11/16. Known ESRD  patient.   INR is 1.92.  Hgb 9.2, plt 136. No s/sx of bleeding.    11/19 PM update: heparin level low, no issues per RN  Goal of Therapy:  Heparin level 0.3-0.7 units/ml Monitor platelets by anticoagulation protocol: Yes   Plan:  Inc heparin to 950 units/hr Re-check heparin level in 8 hours  Abran DukeJames Tahmir Kleckner, PharmD, BCPS Clinical Pharmacist Phone: [phone]

## 2018-06-07 NOTE — Progress Notes (Signed)
PROGRESS NOTE    Jackie [name]  ZOX:[id number] DOB: [birth year] DOA: 06/04/2018 PCP: Mirna Mires, MD  Outpatient Specialists:   Brief Narrative:  Patient is an 83 year old African-American female, with past medical history significant for hypertension, diabetes mellitus, end-stage renal disease on hemodialysis on Tuesday, Thursday and Saturday; hyperlipidemia, anemia of chronic kidney disease, peripheral vascular disease status post left below-knee amputation, patient and family poor historians, reported frequent falls, presented to ED due to pain in her left leg after a fall.  However noted to have dry gangrene of her right foot.  Orthopedic/Dr. Lajoyce Corners consulted 11/17 and plans for right BKA.  However due to pericardial rub noted on 11/18, after discussion with nephrology, Dr. Lajoyce Corners was advised that patient will be dialyzed successively on 11/18 and 19, will need to be cleared by nephrology prior to planned surgery 11/20. Hypoglycemia has been noted during current hospitalization and as per family report, patient has had intermittent episodes of loss of consciousness during hemodialysis sessions. Insulin is currently on hold and patient was treated with hypoglycemia with IV D50 and D10 infusions.  Course complicated by recurrent hypoglycemia.  Cardiology consulted 11/19 for preop clearance and A. fib management.  Assessment & Plan:   Principal Problem:   Gangrene (HCC) Active Problems:   ESRF (end stage renal failure) (HCC)   HTN (hypertension), benign   Diabetes mellitus (HCC)   Right foot ulcer, with necrosis of muscle (HCC)   Atrial fibrillation (HCC)   Essential hypertension   Mixed hyperlipidemia   PAD (peripheral artery disease) (HCC)   Long term (current) use of anticoagulants [Z79.01]  .  PAD with dry gangrene of right foot (HCC) - Orthopedic/Dr. Lajoyce Corners consulted 11/17 and plans for right BKA.  However due to pericardial rub noted on 11/18, after discussion with Nephrology, Dr. Lajoyce Corners  was advised that patient will be dialyzed successively on 11/18 and 19, will need to be cleared by Nephrology prior to planned surgery 11/20. -Dr. Lajoyce Corners also advised that he does not see need for vascular consultation at this time which may become necessary if postop stump fails to heal. -Continue IV Zosyn. -CTA aorta with bifemoral 11/16: RLE with multifocal stenosis in the right SFA and at least 2 critical areas of stenosis in the right SFA. -Given advanced age, A. fib, suspected pericarditis, requested cardiology input for preop clearance and A. fib management.  . Atrial fibrillation (HCC) -Rate fluctuating and mildly uncontrolled at times -Coumadin is on hold.  INR 1.92.  Started heparin drip. -Cardiology consulted.  Need to consider rate control medications.  Recurrent falls: Physical therapy consult postop for recommendations.  Diabetes mellitus with hypoglycemia: - A1c 5.4 on 10/8.  Insulins were held.  She was treated with IV D50 followed by D10 infusion until 11/18 morning.  D10 was discontinued.  Encourage diet as tolerated.   - She had CBG of 429 on 11/18 p.m. for which she got 10 units of NovoLog leading to hypoglycemia with CBG in the 20s early morning 11/19, treated with half amp of D50.  - Continue to monitor CBGs closely off of D10 but if has recurrent hypoglycemia, may have to resume.  Would consider customized supersensitive SSI if persistently elevated CBGs >180.  She may have brittle DM.  Marland Kitchen ESRD on TTS HD -Nephrology follow-up appreciated.  Undergoing serial HD yesterday and today.  As per Dr. Beaulah Corin follow-up, recommends TTE and cardiology consultation to treat all etiologies of possible pericarditis prior to BKA.  Uremic pericarditis:  Pericardial rub  noted on 11/18, likely due to uremic pericarditis although patient asymptomatic. -Had HD on 11/18 and will undergo HD today as well. -TTE 08/02/2017: LVEF 55-60%.  Moderate MR, mild to moderate TR and PA peak pressure 65  mmHg.  Requested repeat TTE.  Cardiology consulted.  Anemia of chronic kidney disease: Stable.  ESA due 11/26.  Thrombocytopenia Unclear etiology.  Relatively stable.  Marland Kitchen PAD (peripheral artery disease) (HCC) -See above -For right BKA on Wednesday provided she is cleared by nephrology/cardiology.   Essential hypertension: Hypotensive this morning in the SBP of 70s-80s but asymptomatic.  Blood pressure has improved to 119/42.  Not on antihypertensives.  Monitor closely.  Appears euvolemic.   DVT prophylaxis: Patient was on Coumadin, currently held for upcoming surgery.  INR subtherapeutic, IV heparin initiated. Code Status: Full Family Communication: None at bedside Disposition Plan: To be determined pending further interventions as indicated above.   Consultants:   Orthopedic team.  Nephrology  Cardiology  Procedures:   HD  Antimicrobials:   IV Zosyn   Subjective: Patient was sleeping when I visited her this morning.  She woke up.  Reported no complaints.  Was not happy that she has to have HD again today.  No chest pain, palpitations, dyspnea or leg pain reported.  Objective: Vitals:   06/07/18 0736 06/07/18 0800 06/07/18 0900 06/07/18 1151  BP: (!) 78/40  (!) 83/73 (!) 119/42  Pulse: 100 96  100  Resp: 10 18  15   Temp: 98 F (36.7 C)   98 F (36.7 C)  TempSrc: Oral   Oral  SpO2: 100% 100%  100%  Weight:        Intake/Output Summary (Last 24 hours) at 06/07/2018 1246 Last data filed at 06/07/2018 0901 Gross per 24 hour  Intake 710 ml  Output -263 ml  Net 973 ml   Filed Weights   06/04/18 1900  Weight: 56.7 kg    Examination:  General exam: Pleasant elderly female, moderately built and nourished lying comfortably propped up in bed.  Does not appear in any distress.   Respiratory system: Clear to auscultation.  No increased work of breathing.  Stable. Cardiovascular system: S1 and S2 heard, irregularly irregular.  No JVD.  Pericardial rub heard  but seems softer than 11/18.?  Short systolic murmur 2/6.  No pedal edema.  Telemetry personally reviewed: Overnight had A. fib with RVR in the 120s-130s, now down to 100s. Gastrointestinal system: Abdomen is nondistended, soft and nontender.  Normal bowel sounds heard.  Stable. Central nervous system: Alert and oriented. No focal neurological deficits.  Stable. Extremities: Left BKA with healed stump.  Right toes with dry gangrene.  Absent right dorsalis pedis and posterior tibial pulsations. Skin: Dark discoloration of right lower leg.  Data Reviewed: I have personally reviewed following labs and imaging studies  CBC: Recent Labs  Lab 06/02/18 1905 06/04/18 1055 06/05/18 0258 06/06/18 0222 06/07/18 0506  WBC 11.6* 8.5 8.3 9.4 10.7*  NEUTROABS 9.5* 7.3  --  7.9*  --   HGB 10.7* 9.6* 8.8* 9.1* 9.2*  HCT 33.8* 30.2* 28.0* 29.7* 30.2*  MCV 90.6 89.1 88.1 88.9 89.1  PLT 146* 139* 137* 132* 136*   Basic Metabolic Panel: Recent Labs  Lab 06/02/18 1905 06/04/18 1055 06/05/18 0258 06/06/18 0222 06/07/18 0518  NA 135 138 138 134* 136  K 3.9 3.2* 3.3* 3.9 3.7  CL 98 99 100 97* 98  CO2 23 26 23 24 28   GLUCOSE 78 139* 132* 161* 35*  BUN 8 28* 37* 42* 21  CREATININE 2.68* 5.51* 6.51* 7.55* 4.68*  CALCIUM 8.5* 9.1 9.0 9.0 8.4*  MG  --   --   --  2.2  --   PHOS  --   --   --  5.3*  --    GFR: Estimated Creatinine Clearance: 8.4 mL/min (A) (by C-G formula based on SCr of 4.68 mg/dL (H)). Liver Function Tests: Recent Labs  Lab 06/02/18 1905 06/04/18 1055 06/06/18 0222  AST 36 28  --   ALT 17 17  --   ALKPHOS 71 70  --   BILITOT 2.3* 1.3*  --   PROT 7.0 6.6  --   ALBUMIN 2.5* 2.4* 2.1*   Coagulation Profile: Recent Labs  Lab 06/03/18 06/04/18 1055 06/05/18 0258 06/05/18 1450 06/07/18 0506  INR 4.5* 3.02 2.87 2.33 1.92   Cardiac Enzymes: Recent Labs  Lab 06/02/18 1905  TROPONINI 0.04*   CBG: Recent Labs  Lab 06/07/18 0504 06/07/18 0506 06/07/18 0522  06/07/18 0735 06/07/18 1135  GLUCAP 28* 26* 184* 126* 143*     Recent Results (from the past 240 hour(s))  Culture, blood (Routine X 2) w Reflex to ID Panel     Status: None (Preliminary result)   Collection Time: 06/04/18 10:55 AM  Result Value Ref Range Status   Specimen Description BLOOD RIGHT FOREARM  Final   Special Requests   Final    BOTTLES DRAWN AEROBIC AND ANAEROBIC Blood Culture adequate volume   Culture   Final    NO GROWTH 2 DAYS Performed at [facility], [address] [place], [place] [address]    Report Status PENDING  Incomplete  Culture, blood (Routine X 2) w Reflex to ID Panel     Status: None (Preliminary result)   Collection Time: 06/04/18 11:15 AM  Result Value Ref Range Status   Specimen Description BLOOD RIGHT HAND  Final   Special Requests   Final    BOTTLES DRAWN AEROBIC AND ANAEROBIC Blood Culture adequate volume   Culture   Final    NO GROWTH 2 DAYS Performed at [facility], [address] [place], [place] [address]    Report Status PENDING  Incomplete  MRSA PCR Screening     Status: None   Collection Time: 06/04/18  7:05 PM  Result Value Ref Range Status   MRSA by PCR NEGATIVE NEGATIVE Final    Comment:        The GeneXpert MRSA Assay (FDA approved for NASAL specimens only), is one component of a comprehensive MRSA colonization surveillance program. It is not intended to diagnose MRSA infection nor to guide or monitor treatment for MRSA infections. Performed at [facility], [address] [place], [place] [address]          Radiology Studies: No results found.      Scheduled Meds: . calcitRIOL  2 mcg Oral Q T,Th,Sa-HD  . calcium acetate  2,001 mg Oral TID WC  . [START ON 06/08/2018] chlorhexidine  60 mL Topical Once  . Chlorhexidine Gluconate Cloth  6 each Topical Q0600  . cinacalcet  30 mg Oral Q supper  . [START ON 06/14/2018] darbepoetin (ARANESP) injection - DIALYSIS  100 mcg Intravenous Q Tue-HD   . feeding supplement (NEPRO CARB STEADY)  237 mL Oral BID BM  . heparin  1,800 Units Dialysis Once in dialysis  . multivitamin  1 tablet Oral QHS  . sevelamer carbonate  2.4 g  Oral TID WC   Continuous Infusions: . sodium chloride    . sodium chloride    . piperacillin-tazobactam (ZOSYN)  IV 3.375 g (06/06/18 2144)     LOS: 3 days    Marcellus Scott, MD, FACP, [facility]. [facility] Pager [phone]  If 7PM-7AM, please contact night-coverage [email or web address] Password [email or web address] 06/07/2018, 12:46 PM

## 2018-06-07 NOTE — Consult Note (Addendum)
Cardiology Consultation:   Patient ID: Jackie [name] MRN: [id number]; DOB: 08-07-1934  Admit date: 06/04/2018 Date of Consult: 06/07/2018  Primary Care Provider: Mirna Mires, MD Primary Cardiologist: Chrystie Nose, MD  Primary Electrophysiologist:  None    Patient Profile:   Jackie [name] is a 83 y.o. female with a hx of PAD, ESRD, DM who is being seen today for the evaluation of preoperative risk evaluation at the request of Dr. Waymon Amato and Dr. Lajoyce Corners.  History of Present Illness:   Jackie [name] has evidence of right foot gangrene with severe multilevel stenoses in the right superficial femoral artery and is scheduled to undergo right below the knee amputation.  She has a loud pericardial rub and surgery is been delayed to allow more aggressive hemodialysis for resolution of pericarditis.  Cardiology is also asked to evaluate before surgery.  Jackie [name] has a long-standing persistent atrial fibrillation that has been well rate controlled.  She is on warfarin anticoagulation for stroke prevention and has not had bleeding problems.  She has not had problems with congestive heart failure, has never complained of angina, has not had a knee previous myocardial infarction and does not have known coronary artery disease, although she clearly has extensive peripheral vascular disease with previous left below the knee amputation and now with right foot gangrene.  She denies angina or dyspnea but is completely sedentary.  She has recently had recurrent episodes of loss of consciousness during dialysis, most recently last Thursday, although it is unclear whether these are related to hypotension or hypoglycemia.  She is not on any antihypertensive medications and insulin has been discontinued.  She does not complain of palpitations.  Her most recent functional study was an echocardiogram in January 2019 that showed preserved left ventricular systolic function with an ejection fraction of  55-60%. There was moderate pulmonary artery hypertension with a PA pressure of 65 mmHg.  There was moderate eccentric posteriorly directed mitral insufficiency, but there were no other significant valvular abnormalities.  Diastolic function was not evaluated due to atrial fibrillation but there is only mild left ventricular hypertrophy.  As far as I can tell she is never had cardiac catheterization or stress testing.  An echocardiogram is pending on this admission for evaluation of her pericardium.  Past Medical History:  Diagnosis Date  . Anemia   . Arthritis   . Diabetes mellitus   . End-stage renal disease on hemodialysis (HCC)    dialysis T/Th/Sa  . Hx of BKA, left (HCC)   . Hyperlipidemia   . Hypertension   . Pneumonia     Past Surgical History:  Procedure Laterality Date  . ABDOMINAL AORTOGRAM W/LOWER EXTREMITY N/A 12/01/2017   Procedure: ABDOMINAL AORTOGRAM W/LOWER EXTREMITY;  Surgeon: Nada Libman, MD;  Location: [facility] INVASIVE CV LAB;  Service: Cardiovascular;  Laterality: N/A;  bilateral  . AMPUTATION Left 04/24/2015   Procedure: LEFT BELOW KNEE AMPUTATION;  Surgeon: Nadara Mustard, MD;  Location: [facility] OR;  Service: Orthopedics;  Laterality: Left;  . ARTERIOVENOUS GRAFT PLACEMENT Left    non function  . AV FISTULA PLACEMENT Right 01/30/2013   Procedure: ARTERIOVENOUS (AV) FISTULA CREATION;  Surgeon: Fransisco Hertz, MD;  Location: [facility] OR;  Service: Vascular;  Laterality: Right;  . AV FISTULA PLACEMENT Left 10/04/2017   Procedure: INSERTION OF ARTERIOVENOUS (AV) GORE-TEX GRAFT LEFT UPPER ARM;  Surgeon: Chuck Hint, MD;  Location: [facility] OR;  Service: Vascular;  Laterality: Left;  . CESAREAN SECTION    .  EYE SURGERY Bilateral    catarct  . FISTULA SUPERFICIALIZATION Right 04/24/2013   Procedure: FISTULA SUPERFICIALIZATION;  Surgeon: Larina Earthly, MD;  Location: [facility] OR;  Service: Vascular;  Laterality: Right;  . FISTULOGRAM Right 07/26/2013   Procedure: FISTULOGRAM;  Surgeon: Larina Earthly, MD;  Location: [facility] CATH LAB;  Service: Cardiovascular;  Laterality: Right;  . INSERTION OF DIALYSIS CATHETER Right 04/24/2013   Procedure: INSERTION OF DIALYSIS CATHETER;  Surgeon: Larina Earthly, MD;  Location: [facility] OR;  Service: Vascular;  Laterality: Right;  . PERIPHERAL VASCULAR ATHERECTOMY  12/01/2017   Procedure: PERIPHERAL VASCULAR ATHERECTOMY;  Surgeon: Nada Libman, MD;  Location: [facility] IN[facility] CV LAB;  Service: Cardiovascular;;  Rt. SFA  . PERIPHERAL VASCULAR CATHETERIZATION N/A 12/05/2014   Procedure: Abdominal Aortogram;  Surgeon: Fransisco Hertz, MD;  Location: [facility] INVASIVE CV LAB;  Service: Cardiovascular;  Laterality: N/A;  . PERIPHERAL VASCULAR CATHETERIZATION N/A 03/19/2015   Procedure: Lower Extremity Angiography;  Surgeon: Nada Libman, MD;  Location: [facility] INVASIVE CV LAB;  Service: Cardiovascular;  Laterality: N/A;  . PERIPHERAL VASCULAR CATHETERIZATION  03/19/2015   Procedure: Peripheral Vascular Intervention;  Surgeon: Nada Libman, MD;  Location: [facility] INVAS[facility] CV LAB;  Service: Cardiovascular;;  left sfa and popliteal stent  . TONSILLECTOMY    . UPPER EXTREMITY VENOGRAPHY N/A 09/17/2017   Procedure: UPPER EXTREMITY VENOGRAPHY;  Surgeon: Chuck Hint, MD;  Location: [facility] INVASIVE CV LAB;  Service: Cardiovascular;  Laterality: N/A;     Home Medications:  Prior to Admission medications   Medication Sig Start Date End Date Taking? Authorizing Provider  albuterol (PROVENTIL HFA;VENTOLIN HFA) 108 (90 BASE) MCG/ACT inhaler Inhale 1-2 puffs into the lungs every 6 (six) hours as needed for wheezing. 12/27/12  Yes Palumbo, April, MD  b complex-vitamin c-folic acid (NEPHRO-VITE) 0.8 MG TABS tablet Take 1 tablet by mouth at bedtime.   Yes [provider]  calcium acetate (PHOSLO) 667 MG capsule Take 2,001 mg by mouth 3 (three) times daily with meals.   Yes [provider]  cinacalcet (SENSIPAR) 30 MG tablet Take 30 mg by mouth daily.   Yes [provider]  guaiFENesin (ROBITUSSIN) 100 MG/5ML SOLN Take 15 mLs by mouth every 4 (four) hours as needed for cough or to loosen phlegm.   Yes [provider]  insulin detemir (LEVEMIR) 100 UNIT/ML injection Inject 0.08 mLs (8 Units total) into the skin at bedtime. 04/27/18  Yes Vann, Jessica U, DO  levofloxacin (LEVAQUIN) 500 MG tablet Take 1 tablet (500 mg total) by mouth daily. 06/02/18  Yes Gerhard Munch, MD  lidocaine-prilocaine (EMLA) cream Apply 1 application topically daily as needed (prior to dialysis).  11/24/17  Yes [provider]  promethazine (PHENERGAN) 12.5 MG tablet Take 12.5 mg by mouth daily as needed for nausea or vomiting.  07/22/17  Yes [provider]  sevelamer carbonate (RENVELA) 2.4 g PACK Take 2.4 g by mouth 3 (three) times daily with meals.  09/06/17  Yes [provider]  traMADol (ULTRAM) 50 MG tablet Take 50 mg by mouth every 6 (six) hours as needed for moderate pain or severe pain.   Yes [provider]  warfarin (COUMADIN) 5 MG tablet TAKE 1 to 1.5 TABLETS BY MOUTH DAILY AS DIRECTED BY THE COUMADIN CLINIC. Patient taking differently: Take 5 mg by mouth daily.  04/29/18  Yes Joseph Art, DO    Inpatient Medications: Scheduled Meds: . calcitRIOL  2 mcg Oral Q T,Th,Sa-HD  .  calcium acetate  2,001 mg Oral TID WC  . [START ON 06/08/2018] chlorhexidine  60 mL Topical Once  . Chlorhexidine Gluconate Cloth  6 each Topical Q0600  . cinacalcet  30 mg Oral Q supper  . [START ON 06/14/2018] darbepoetin (ARANESP) injection - DIALYSIS  100 mcg Intravenous Q Tue-HD  . feeding supplement (NEPRO CARB STEADY)  237 mL Oral BID BM  . heparin  1,800 Units Dialysis Once in dialysis  . multivitamin  1 tablet Oral QHS  . sevelamer carbonate  2.4 g Oral TID WC   Continuous Infusions: . sodium chloride    . sodium chloride    . heparin 800 Units/hr (06/07/18 1358)  . piperacillin-tazobactam (ZOSYN)  IV 3.375 g (06/06/18 2144)   PRN  Meds: sodium chloride, sodium chloride, acetaminophen, lidocaine (PF), lidocaine-prilocaine, ondansetron **OR** ondansetron (ZOFRAN) IV, pentafluoroprop-tetrafluoroeth, polyethylene glycol  Allergies:   No Known Allergies  Social History:   Social History   Socioeconomic History  . Marital status: Widowed    Spouse name: Not on file  . Number of children: Not on file  . Years of education: Not on file  . Highest education level: Not on file  Occupational History  . Occupation: retired  Engineer, production  . Financial resource strain: Not on file  . Food insecurity:    Worry: Not on file    Inability: Not on file  . Transportation needs:    Medical: Not on file    Non-medical: Not on file  Tobacco Use  . Smoking status: Never Smoker  . Smokeless tobacco: Never Used  Substance and Sexual Activity  . Alcohol use: No  . Drug use: No  . Sexual activity: Not on file  Lifestyle  . Physical activity:    Days per week: Not on file    Minutes per session: Not on file  . Stress: Not on file  Relationships  . Social connections:    Talks on phone: Not on file    Gets together: Not on file    Attends religious service: Not on file    Active member of club or organization: Not on file    Attends meetings of clubs or organizations: Not on file    Relationship status: Not on file  . Intimate partner violence:    Fear of current or ex partner: Not on file    Emotionally abused: Not on file    Physically abused: Not on file    Forced sexual activity: Not on file  Other Topics Concern  . Not on file  Social History Narrative  . Not on file    Family History:    Family History  Problem Relation Age of Onset  . Diabetes Mother   . Heart disease Mother   . Hypertension Mother      ROS:  Please see the history of present illness.   All other ROS reviewed and negative.     Physical Exam/Data:   Vitals:   06/07/18 1151 06/07/18 1300 06/07/18 1312 06/07/18 1400  BP: (!) 119/42  121/76 118/70 139/79  Pulse: 100 (!) 102 100 (!) 110  Resp: 15 18 18 20   Temp: 98 F (36.7 C) 98 F (36.7 C)    TempSrc: Oral     SpO2: 100%     Weight:      Height:        Intake/Output Summary (Last 24 hours) at 06/07/2018 1449 Last data filed at 06/07/2018 1200 Gross per 24 hour  Intake 830 ml  Output -263 ml  Net 1093 ml   Filed Weights   06/04/18 1900  Weight: 56.7 kg   Body mass index is 19.01 kg/m.  General:  Well nourished, well developed, in no acute distress HEENT: normal Lymph: no adenopathy Neck: no JVD Endocrine:  No thryomegaly Vascular: No carotid bruits; FA pulses 2+ bilaterally without bruits  Cardiac:  normal S1, S2; irregular; loud pericardial rub Lungs:  clear to auscultation bilaterally, no wheezing, rhonchi or rales  Abd: soft, nontender, no hepatomegaly  Ext: no edema. L upper extremity fistula, cannulated for HD Musculoskeletal:  L BKA, R foot gangrene Skin: warm and dry  Neuro:  CNs 2-12 intact, no focal abnormalities noted Psych:  Normal affect   EKG:  The EKG was personally reviewed and demonstrates: Atrial fibrillation with controlled ventricular response, generalized low voltage, rightward axis, poor R wave progression across the anterior precordium, prolonged QT interval, but no specific ischemic repolarization changes. Telemetry:  Telemetry was personally reviewed and demonstrates: Atrial fibrillation, controlled ventricular response  Relevant CV Studies: Echo January 2019  - Left ventricle: The cavity size was normal. There was mild   concentric hypertrophy. Systolic function was normal. The   estimated ejection fraction was in the range of 55% to 60%. Wall   motion was normal; there were no regional wall motion   abnormalities. - Mitral valve: Calcified annulus. Mildly thickened leaflets .   There was moderate regurgitation directed eccentrically and   posteriorly. - Left atrium: The atrium was mildly dilated. - Right ventricle:  Systolic function was reduced. - Tricuspid valve: There was mild-moderate regurgitation directed   centrally. - Pulmonary arteries: Systolic pressure was moderately increased.   PA peak pressure: 65 mm Hg (S).   Laboratory Data:  Chemistry Recent Labs  Lab 06/05/18 0258 06/06/18 0222 06/07/18 0518  NA 138 134* 136  K 3.3* 3.9 3.7  CL 100 97* 98  CO2 23 24 28   GLUCOSE 132* 161* 35*  BUN 37* 42* 21  CREATININE 6.51* 7.55* 4.68*  CALCIUM 9.0 9.0 8.4*  GFRNONAA 5* 4* 8*  GFRAA 6* 5* 9*  ANIONGAP 15 13 10     Recent Labs  Lab 06/02/18 1905 06/04/18 1055 06/06/18 0222  PROT 7.0 6.6  --   ALBUMIN 2.5* 2.4* 2.1*  AST 36 28  --   ALT 17 17  --   ALKPHOS 71 70  --   BILITOT 2.3* 1.3*  --    Hematology Recent Labs  Lab 06/05/18 0258 06/06/18 0222 06/07/18 0506  WBC 8.3 9.4 10.7*  RBC 3.18* 3.34* 3.39*  HGB 8.8* 9.1* 9.2*  HCT 28.0* 29.7* 30.2*  MCV 88.1 88.9 89.1  MCH 27.7 27.2 27.1  MCHC 31.4 30.6 30.5  RDW 17.3* 17.3* 17.9*  PLT 137* 132* 136*   Cardiac Enzymes Recent Labs  Lab 06/02/18 1905  TROPONINI 0.04*   No results for input(s): TROPIPOC in the last 168 hours.  BNPNo results for input(s): BNP, PROBNP in the last 168 hours.  DDimer No results for input(s): DDIMER in the last 168 hours.  Radiology/Studies:  Dg Chest 2 View  Result Date: 06/04/2018 CLINICAL DATA:  right leg pain onset last night, also reports new blisters on right foot, reports a "couple falls last night when moving from the wheelchair to the bed." dialysis patient, on T/Th/S, did not receive treatment today. hx ESRD, htn, BKA left side. Hx DM. Pt also reports right sided hip pain. EXAM: CHEST - 2  VIEW COMPARISON:  06/02/2018 FINDINGS: Airspace opacities at the left lung base obscuring the left diaphragmatic leaflet. Mild interstitial prominence diffusely. Marked cardiomegaly.  Aortic Atherosclerosis (ICD10-170.0). No significant effusion. Visualized bones unremarkable. Vascular stents in  the right subclavian and axillary region as before. IMPRESSION: 1.  Persistent airspace opacities at the left lung base. 2. Stable cardiomegaly. Electronically Signed   By: Corlis Leak  Hassell M.D.   On: 06/04/2018 12:17   Dg Pelvis 1-2 Views  Result Date: 06/04/2018 CLINICAL DATA:  right leg pain onset last night, also reports new blisters on right foot, reports a "couple falls last night when moving from the wheelchair to the bed." dialysis patient, on T/Th/S, did not receive treatment today. hx ESRD, htn, BKA left side. Hx DM. Pt also reports right sided hip pain. EXAM: PELVIS - 1-2 VIEW COMPARISON:  CT 04/20/2013 and previous FINDINGS: There is no evidence of pelvic fracture or diastasis. No pelvic bone lesions are seen. Extensive iliofemoral and pelvic arterial calcifications. IMPRESSION: No acute findings. Electronically Signed   By: Corlis Leak  Hassell M.D.   On: 06/04/2018 12:18   Ct Angio Ao+bifem W & Or Wo Contrast  Result Date: 06/05/2018 CLINICAL DATA:  83 year old with abnormal right foot. Gas gangrene. Previous arteriogram demonstrated areas of stenosis in the right SFA that were treated with arthrectomy and balloon angioplasty. Known occlusion of the right runoff vessels. EXAM: CT ANGIOGRAPHY OF ABDOMINAL AORTA WITH ILIOFEMORAL RUNOFF TECHNIQUE: Multidetector CT imaging of the abdomen, pelvis and lower extremities was performed using the standard protocol during bolus administration of intravenous contrast. Multiplanar CT image reconstructions and MIPs were obtained to evaluate the vascular anatomy. CONTRAST:  100mL ISOVUE-370 IOPAMIDOL (ISOVUE-370) INJECTION 76% COMPARISON:  Arteriogram report from 12/01/2017 FINDINGS: VASCULAR Aorta: Diffuse atherosclerotic disease in the abdominal aorta without aneurysm or dissection. Many of the images are limited due to motion artifact. Celiac: Mild stenosis in the proximal celiac trunk measuring 40-50%. Celiac trunk branches are heavily calcified. SMA: Patent without  evidence of aneurysm, dissection, vasculitis or significant stenosis. Renals: Mixed plaque at the origin of the right renal artery causing mild-to-moderate stenosis. Large amount of motion artifact associated the renal arteries. No significant stenosis in the proximal left renal artery. IMA: IMA is heavily calcified but appears to be patent. RIGHT Lower Extremity Inflow: Right iliac arteries are heavily calcified. Common, internal and external iliac arteries are patent. No significant stenosis in the right common and right external iliac artery. Outflow: Right common femoral artery is widely patent with mild disease. Profunda femoral arteries are patent. The right SFA is heavily calcified. Proximal right SFA is patent. Focal high-grade stenosis in the mid right SFA on sequence 6, image 290. There is another focus of critical stenosis or occlusion in the distal right SFA on image 308. Right popliteal artery is diffusely diseased with segmental areas of stenosis. Runoff: The runoff vessels are heavily calcified and difficult to evaluate lumen patency. Right runoff vessels are presumed to be occluded based on the previous angiogram report. Limited evaluation of the arteries at the ankle due to vessel calcifications. LEFT Lower Extremity Inflow: Left iliac arteries are heavily calcified but patent. No significant stenosis in the left common or external iliac artery. Outflow: Atherosclerotic calcifications in left common femoral artery without significant stenosis. Left profunda femoral arteries are patent. Left SFA is heavily calcified and there is occlusion of the mid and distal SFA. Left popliteal artery appears to be occluded. Runoff: Below the knee amputation. Runoff vessels are heavily calcified and  appear to be occluded. Veins: No obvious venous abnormality within the limitations of this arterial phase study. Review of the MIP images confirms the above findings. NON-VASCULAR Lower chest: Calcified granuloma at the  right lung base. Small left pleural effusion. Heart is enlarged. Contrast refluxes into the hepatic veins and compatible with increased right heart pressures. Hepatobiliary: Limited evaluation due to motion artifact. Gallbladder is mildly distended with high-density material compatible with stones. No gross abnormality to the liver. Pancreas: Poorly characterized without gross abnormality. Spleen: Normal in size without focal abnormality. Adrenals/Urinary Tract: Mild fullness in left adrenal gland. Excessive motion artifact in the kidneys. Negative for hydronephrosis. Urinary bladder is unremarkable. Stomach/Bowel: Stomach and bowel structures are unremarkable without dilatation. Excessive motion limits evaluation for inflammatory changes. Lymphatic: No significant lymph node enlargement in the abdomen or pelvis. Reproductive: Extensive vascularity around the uterus and difficult to exclude uterine calcifications. Limited evaluation of the uterus and adnexal structures on this examination. Other: Small amount of free fluid in the pelvis. Negative for free air. Musculoskeletal: Left below the knee amputation. No significant bone abnormalities in the right lower extremity. Diffuse osteopenia. IMPRESSION: VASCULAR 1. Right lower extremity outflow disease with multifocal stenosis in the right SFA. At least 2 critical areas of stenosis in the right SFA. 2. Limited evaluation of the runoff vessels due to severe calcifications. However, the right runoff vessels appear to be occluded and this corresponds with the previous arteriogram report. 3. Left outflow disease demonstrated by occlusion of the left mid and distal SFA and left popliteal artery. 4. Aorta and iliac arteries are heavily calcified without significant inflow stenosis. 5. Stenosis in the right renal artery. Mild stenosis involving the celiac trunk. NON-VASCULAR 1. Limited evaluation of the intra-abdominal structures due to excessive motion artifact. 2. Small  amount of free fluid in the pelvis is nonspecific. 3. Small left pleural effusion. 4. Cholelithiasis. Electronically Signed   By: Richarda Overlie M.D.   On: 06/05/2018 08:33   Dg Foot Complete Right  Result Date: 06/04/2018 CLINICAL DATA:  right leg pain onset last night, also reports new blisters on right foot, reports a "couple falls last night when moving from the wheelchair to the bed." dialysis patient, on T/Th/S, did not receive treatment today. hx ESRD, htn, BKA left side. Hx DM. Pt also reports right sided hip pain. EXAM: RIGHT FOOT COMPLETE - 3+ VIEW COMPARISON:  11/09/2017 FINDINGS: Diffuse osteopenia. Negative for fracture or dislocation. Extensive vascular calcifications. No radiodense foreign body. No subcutaneous gas. IMPRESSION: Osteopenia without acute finding. Electronically Signed   By: Corlis Leak M.D.   On: 06/04/2018 12:15    Assessment and Plan:   1. Preop risk eval: Mrs. Defibaugh is at substantially increased risk for multiple types of medical and cardiovascular complications with surgery.  It is highly likely that she may have underlying coronary artery disease, considering the extent and severity of her peripheral arterial disease.  On the other hand she has preserved left ventricular systolic function and does not have angina pectoris.  As such, I doubt there is any cardiac intervention that might further improve her surgical risk.  Invasive procedures (such as coronary angiography and revascularization) are more likely to hurt rather than help and they would delay what is a necessary surgical procedure.  Beta-blockers cannot be administered due to hypotension risk.  I assume she is not receiving aspirin due to concomitant anticoagulation.  At one point she was receiving clopidogrel but this has been discontinued, I am not sure  why.  Obviously it should not be restarted just before she has surgery.  In the long run however, she would benefit from treatment with statin. 2. Acute  pericarditis, probably uremic: Undergoing dialysis on a more intensive program prior to the planned amputation.  Low voltage on ECG likely due to effusion. Will evaluate by echo.  Clinically she does not have signs of tamponade, although these may be notoriously difficult to identify during irregular rhythm such as atrial fibrillation.  Warfarin anticoagulation was not supratherapeutic. 3. PAD: She should be on statin to target LDL less than 70.  I cannot identify any history of allergies or side effects to statins.  The only record of an LDL cholesterol that I can find is from 2011 when her LDL was 156.  4. Persistent AFib: spontaneously rate controlled. Anticoagulation stopped for planned surgery. Can cover with heaprin IV while waiting for surgery. 5. ESRD on HD: Currently on dialysis, without any hemodynamic abnormalities.  Had syncope last Thursday, unclear if hypoglycemia or hypotension.  If hypotension is confirmed, she might benefit from Midrin predialysis.   Will recheck a lipid profile, probably start a statin, follow-up on echocardiogram. Prognosis is poor, risk of complications is elevated, but no CV intervention that would reduce risk is identified (other than intensive HD for resolution of pericarditis).     For questions or updates, please contact [facility] Please consult [email or web address] for contact info under     Signed, Thurmon Fair, MD  06/07/2018 2:49 PM

## 2018-06-08 ENCOUNTER — Encounter (HOSPITAL_COMMUNITY): Payer: Self-pay | Admitting: Surgery

## 2018-06-08 ENCOUNTER — Inpatient Hospital Stay (HOSPITAL_COMMUNITY)
Admission: EM | Disposition: A | Discharge status: Skilled Nursing Facility | Payer: Self-pay | Source: Home / Self Care | Attending: Internal Medicine

## 2018-06-08 ENCOUNTER — Other Ambulatory Visit (HOSPITAL_COMMUNITY): Payer: Medicare Other

## 2018-06-08 ENCOUNTER — Inpatient Hospital Stay (HOSPITAL_COMMUNITY): Payer: Medicare Other | Admitting: Anesthesiology

## 2018-06-08 ENCOUNTER — Inpatient Hospital Stay (HOSPITAL_COMMUNITY): Payer: Medicare Other

## 2018-06-08 DIAGNOSIS — I361 Nonrheumatic tricuspid (valve) insufficiency: Secondary | ICD-10-CM

## 2018-06-08 DIAGNOSIS — I370 Nonrheumatic pulmonary valve stenosis: Secondary | ICD-10-CM

## 2018-06-08 HISTORY — PX: AMPUTATION: SHX166

## 2018-06-08 LAB — BASIC METABOLIC PANEL
Anion gap: 9 (ref 5–15)
BUN: 18 mg/dL (ref 8–23)
CALCIUM: 8.3 mg/dL — AB (ref 8.9–10.3)
CO2: 25 mmol/L (ref 22–32)
Chloride: 104 mmol/L (ref 98–111)
Creatinine, Ser: 4.26 mg/dL — ABNORMAL HIGH (ref 0.44–1.00)
GFR calc non Af Amer: 9 mL/min — ABNORMAL LOW (ref >60)
GFR, EST AFRICAN AMERICAN: 10 mL/min — AB (ref >60)
Glucose, Bld: 159 mg/dL — ABNORMAL HIGH (ref 70–99)
Potassium: 4.5 mmol/L (ref 3.5–5.1)
SODIUM: 138 mmol/L (ref 135–145)

## 2018-06-08 LAB — POCT I-STAT 4, (NA,K, GLUC, HGB,HCT)
Glucose, Bld: 83 mg/dL (ref 70–99)
HCT: 30 % — ABNORMAL LOW (ref 36.0–46.0)
Hemoglobin: 10.2 g/dL — ABNORMAL LOW (ref 12.0–15.0)
POTASSIUM: 4.3 mmol/L (ref 3.5–5.1)
SODIUM: 140 mmol/L (ref 135–145)

## 2018-06-08 LAB — CBC
HCT: 28.2 % — ABNORMAL LOW (ref 36.0–46.0)
Hemoglobin: 8.8 g/dL — ABNORMAL LOW (ref 12.0–15.0)
MCH: 28.1 pg (ref 26.0–34.0)
MCHC: 31.2 g/dL (ref 30.0–36.0)
MCV: 90.1 fL (ref 80.0–100.0)
Platelets: 116 10*3/uL — ABNORMAL LOW (ref 150–400)
RBC: 3.13 MIL/uL — ABNORMAL LOW (ref 3.87–5.11)
RDW: 18 % — AB (ref 11.5–15.5)
WBC: 10.6 10*3/uL — AB (ref 4.0–10.5)
nRBC: 0 % (ref 0.0–0.2)

## 2018-06-08 LAB — ECHOCARDIOGRAM COMPLETE
Height: 68 in
WEIGHTICAEL: 1992.96 [oz_av]

## 2018-06-08 LAB — GLUCOSE, CAPILLARY
GLUCOSE-CAPILLARY: 28 mg/dL — AB (ref 70–99)
GLUCOSE-CAPILLARY: 169 mg/dL — AB (ref 70–99)
GLUCOSE-CAPILLARY: 89 mg/dL (ref 70–99)
Glucose-Capillary: 119 mg/dL — ABNORMAL HIGH (ref 70–99)
Glucose-Capillary: 142 mg/dL — ABNORMAL HIGH (ref 70–99)
Glucose-Capillary: 158 mg/dL — ABNORMAL HIGH (ref 70–99)
Glucose-Capillary: 289 mg/dL — ABNORMAL HIGH (ref 70–99)
Glucose-Capillary: 93 mg/dL (ref 70–99)
Glucose-Capillary: 96 mg/dL (ref 70–99)

## 2018-06-08 LAB — HEPARIN LEVEL (UNFRACTIONATED): HEPARIN UNFRACTIONATED: 0.25 [IU]/mL — AB (ref 0.30–0.70)

## 2018-06-08 LAB — SURGICAL PCR SCREEN
MRSA, PCR: NEGATIVE
STAPHYLOCOCCUS AUREUS: NEGATIVE

## 2018-06-08 LAB — TYPE AND SCREEN
ABO/RH(D): A POS
Antibody Screen: NEGATIVE

## 2018-06-08 LAB — LIPID PANEL
Cholesterol: 67 mg/dL (ref 0–200)
HDL: 21 mg/dL — ABNORMAL LOW (ref >40)
LDL CALC: 33 mg/dL (ref 0–99)
Total CHOL/HDL Ratio: 3.2 RATIO
Triglycerides: 66 mg/dL (ref <150)
VLDL: 13 mg/dL (ref 0–40)

## 2018-06-08 LAB — PROTIME-INR
INR: 1.88
PROTHROMBIN TIME: 21.3 s — AB (ref 11.4–15.2)

## 2018-06-08 SURGERY — AMPUTATION BELOW KNEE
Anesthesia: General | Site: Leg Lower | Laterality: Right

## 2018-06-08 MED ORDER — FENTANYL CITRATE (PF) 250 MCG/5ML IJ SOLN
INTRAMUSCULAR | Status: DC | PRN
  Administered 2018-06-08: 25 ug via INTRAVENOUS

## 2018-06-08 MED ORDER — EPHEDRINE SULFATE 50 MG/ML IJ SOLN
INTRAMUSCULAR | Status: DC | PRN
  Administered 2018-06-08: 10 mg via INTRAVENOUS

## 2018-06-08 MED ORDER — DEXAMETHASONE SODIUM PHOSPHATE 10 MG/ML IJ SOLN
INTRAMUSCULAR | Status: DC | PRN
  Administered 2018-06-08: 5 mg via INTRAVENOUS

## 2018-06-08 MED ORDER — METOCLOPRAMIDE HCL 5 MG/ML IJ SOLN: 5.0000 mg | Freq: Three times a day (TID) | INTRAMUSCULAR | Status: DC | PRN

## 2018-06-08 MED ORDER — CEFAZOLIN SODIUM-DEXTROSE 2-4 GM/100ML-% IV SOLN
2.0000 g | INTRAVENOUS | Status: DC
  Filled 2018-06-08: qty 100

## 2018-06-08 MED ORDER — PROMETHAZINE HCL 25 MG/ML IJ SOLN: 6.2500 mg | INTRAMUSCULAR | Status: DC | PRN

## 2018-06-08 MED ORDER — LIDOCAINE HCL (CARDIAC) PF 100 MG/5ML IV SOSY
PREFILLED_SYRINGE | INTRAVENOUS | Status: DC | PRN
  Administered 2018-06-08: 30 mg via INTRAVENOUS

## 2018-06-08 MED ORDER — METHOCARBAMOL 1000 MG/10ML IJ SOLN
500.0000 mg | Freq: Four times a day (QID) | INTRAVENOUS | Status: DC | PRN
  Filled 2018-06-08: qty 5

## 2018-06-08 MED ORDER — DOCUSATE SODIUM 100 MG PO CAPS
100.0000 mg | ORAL_CAPSULE | Freq: Two times a day (BID) | ORAL | Status: DC
  Administered 2018-06-08 – 2018-06-10 (×4): 100 mg via ORAL
  Filled 2018-06-08 (×5): qty 1

## 2018-06-08 MED ORDER — PHENYLEPHRINE HCL 10 MG/ML IJ SOLN
INTRAMUSCULAR | Status: DC | PRN
  Administered 2018-06-08: 80 ug via INTRAVENOUS
  Administered 2018-06-08 (×2): 120 ug via INTRAVENOUS
  Administered 2018-06-08 (×4): 80 ug via INTRAVENOUS

## 2018-06-08 MED ORDER — ACETAMINOPHEN 325 MG PO TABS: 325.0000 mg | ORAL_TABLET | Freq: Four times a day (QID) | ORAL | Status: DC | PRN

## 2018-06-08 MED ORDER — SODIUM CHLORIDE 0.9 % IV SOLN
INTRAVENOUS | Status: DC
  Administered 2018-06-08 (×3): via INTRAVENOUS

## 2018-06-08 MED ORDER — METHOCARBAMOL 500 MG PO TABS: 500.0000 mg | ORAL_TABLET | Freq: Four times a day (QID) | ORAL | Status: DC | PRN

## 2018-06-08 MED ORDER — FENTANYL CITRATE (PF) 100 MCG/2ML IJ SOLN: 25.0000 ug | INTRAMUSCULAR | Status: DC | PRN

## 2018-06-08 MED ORDER — HYDROMORPHONE HCL 1 MG/ML IJ SOLN: 0.5000 mg | INTRAMUSCULAR | Status: DC | PRN

## 2018-06-08 MED ORDER — NALOXONE HCL 0.4 MG/ML IJ SOLN
INTRAMUSCULAR | Status: DC | PRN
  Administered 2018-06-08 (×2): 40 ug via INTRAVENOUS

## 2018-06-08 MED ORDER — POLYETHYLENE GLYCOL 3350 17 G PO PACK: 17.0000 g | PACK | Freq: Every day | ORAL | Status: DC | PRN

## 2018-06-08 MED ORDER — PROPOFOL 10 MG/ML IV BOLUS
INTRAVENOUS | Status: DC | PRN
  Administered 2018-06-08: 150 mg via INTRAVENOUS

## 2018-06-08 MED ORDER — ROPIVACAINE HCL 5 MG/ML IJ SOLN
INTRAMUSCULAR | Status: DC | PRN
  Administered 2018-06-08: 20 mL via PERINEURAL

## 2018-06-08 MED ORDER — SODIUM CHLORIDE 0.9 % IV SOLN
INTRAVENOUS | Status: DC | PRN
  Administered 2018-06-08: 60 ug/min via INTRAVENOUS

## 2018-06-08 MED ORDER — CEFAZOLIN SODIUM-DEXTROSE 2-3 GM-%(50ML) IV SOLR
INTRAVENOUS | Status: DC | PRN
  Administered 2018-06-08: 2 g via INTRAVENOUS

## 2018-06-08 MED ORDER — SODIUM CHLORIDE 0.9 % IV SOLN: INTRAVENOUS | Status: DC | PRN

## 2018-06-08 MED ORDER — BUPIVACAINE HCL (PF) 0.5 % IJ SOLN
INTRAMUSCULAR | Status: DC | PRN
  Administered 2018-06-08: 25 mL via PERINEURAL

## 2018-06-08 MED ORDER — FENTANYL CITRATE (PF) 250 MCG/5ML IJ SOLN
INTRAMUSCULAR | Status: AC
  Filled 2018-06-08: qty 5

## 2018-06-08 MED ORDER — SODIUM CHLORIDE 0.9 % IV SOLN
INTRAVENOUS | Status: DC | PRN
  Administered 2018-06-08: 13:00:00 via INTRAVENOUS

## 2018-06-08 MED ORDER — CHLORHEXIDINE GLUCONATE 4 % EX LIQD
CUTANEOUS | Status: AC
  Administered 2018-06-08: 11:00:00
  Filled 2018-06-08: qty 15

## 2018-06-08 MED ORDER — METOCLOPRAMIDE HCL 10 MG PO TABS: 5.0000 mg | ORAL_TABLET | Freq: Three times a day (TID) | ORAL | Status: DC | PRN

## 2018-06-08 MED ORDER — MIDAZOLAM HCL 2 MG/2ML IJ SOLN
INTRAMUSCULAR | Status: AC
  Filled 2018-06-08: qty 2

## 2018-06-08 MED ORDER — BISACODYL 10 MG RE SUPP: 10.0000 mg | Freq: Every day | RECTAL | Status: DC | PRN

## 2018-06-08 MED ORDER — 0.9 % SODIUM CHLORIDE (POUR BTL) OPTIME
TOPICAL | Status: DC | PRN
  Administered 2018-06-08: 1000 mL

## 2018-06-08 MED ORDER — PROPOFOL 10 MG/ML IV BOLUS
INTRAVENOUS | Status: AC
  Filled 2018-06-08: qty 20

## 2018-06-08 MED ORDER — OXYCODONE HCL 5 MG PO TABS
5.0000 mg | ORAL_TABLET | ORAL | Status: DC | PRN
  Administered 2018-06-08: 5 mg via ORAL
  Administered 2018-06-10 – 2018-06-11 (×2): 10 mg via ORAL
  Filled 2018-06-08: qty 1
  Filled 2018-06-08: qty 2

## 2018-06-08 MED ORDER — OXYCODONE HCL 5 MG PO TABS
10.0000 mg | ORAL_TABLET | ORAL | Status: DC | PRN
  Administered 2018-06-09: 10 mg via ORAL
  Filled 2018-06-08: qty 2

## 2018-06-08 MED ORDER — PHENYLEPHRINE 40 MCG/ML (10ML) SYRINGE FOR IV PUSH (FOR BLOOD PRESSURE SUPPORT)
PREFILLED_SYRINGE | INTRAVENOUS | Status: DC | PRN
  Administered 2018-06-08: 80 ug via INTRAVENOUS
  Administered 2018-06-08: 120 ug via INTRAVENOUS

## 2018-06-08 MED ORDER — FENTANYL CITRATE (PF) 100 MCG/2ML IJ SOLN
INTRAMUSCULAR | Status: AC
  Filled 2018-06-08: qty 2

## 2018-06-08 MED ORDER — ONDANSETRON HCL 4 MG PO TABS: 4.0000 mg | ORAL_TABLET | Freq: Four times a day (QID) | ORAL | Status: DC | PRN

## 2018-06-08 MED ORDER — SODIUM CHLORIDE 0.9 % IV SOLN: INTRAVENOUS | Status: DC

## 2018-06-08 MED ORDER — MAGNESIUM CITRATE PO SOLN
1.0000 | Freq: Once | ORAL | Status: DC | PRN
  Filled 2018-06-08: qty 296

## 2018-06-08 MED ORDER — ALBUMIN HUMAN 5 % IV SOLN
INTRAVENOUS | Status: DC | PRN
  Administered 2018-06-08: 13:00:00 via INTRAVENOUS

## 2018-06-08 MED ORDER — ONDANSETRON HCL 4 MG/2ML IJ SOLN: 4.0000 mg | Freq: Four times a day (QID) | INTRAMUSCULAR | Status: DC | PRN

## 2018-06-08 SURGICAL SUPPLY — 40 items
APL SKNCLS STERI-STRIP NONHPOA (GAUZE/BANDAGES/DRESSINGS) ×4
BENZOIN TINCTURE PRP APPL 2/3 (GAUZE/BANDAGES/DRESSINGS) ×12 IMPLANT
BLADE SAW RECIP 87.9 MT (BLADE) ×3 IMPLANT
BLADE SURG 21 STRL SS (BLADE) ×3 IMPLANT
BNDG COHESIVE 6X5 TAN STRL LF (GAUZE/BANDAGES/DRESSINGS) ×4 IMPLANT
BNDG GAUZE ELAST 4 BULKY (GAUZE/BANDAGES/DRESSINGS) ×4 IMPLANT
CANISTER WOUND CARE 500ML ATS (WOUND CARE) ×2 IMPLANT
COVER SURGICAL LIGHT HANDLE (MISCELLANEOUS) ×3 IMPLANT
COVER WAND RF STERILE (DRAPES) ×3 IMPLANT
CUFF TOURNIQUET SINGLE 34IN LL (TOURNIQUET CUFF) IMPLANT
CUFF TOURNIQUET SINGLE 44IN (TOURNIQUET CUFF) IMPLANT
DRAPE INCISE IOBAN 66X45 STRL (DRAPES) IMPLANT
DRAPE U-SHAPE 47X51 STRL (DRAPES) ×3 IMPLANT
DRESSING PREVENA PLUS CUSTOM (GAUZE/BANDAGES/DRESSINGS) ×1 IMPLANT
DRSG PREVENA PLUS CUSTOM (GAUZE/BANDAGES/DRESSINGS) ×3
DURAPREP 26ML APPLICATOR (WOUND CARE) ×3 IMPLANT
ELECT REM PT RETURN 9FT ADLT (ELECTROSURGICAL) ×3
ELECTRODE REM PT RTRN 9FT ADLT (ELECTROSURGICAL) ×1 IMPLANT
GLOVE BIOGEL PI IND STRL 9 (GLOVE) ×1 IMPLANT
GLOVE BIOGEL PI INDICATOR 9 (GLOVE) ×2
GLOVE SURG ORTHO 9.0 STRL STRW (GLOVE) ×3 IMPLANT
GOWN STRL REUS W/ TWL XL LVL3 (GOWN DISPOSABLE) ×2 IMPLANT
GOWN STRL REUS W/TWL XL LVL3 (GOWN DISPOSABLE) ×4
KIT BASIN OR (CUSTOM PROCEDURE TRAY) ×3 IMPLANT
KIT TURNOVER KIT B (KITS) ×3 IMPLANT
MANIFOLD NEPTUNE II (INSTRUMENTS) ×3 IMPLANT
NS IRRIG 1000ML POUR BTL (IV SOLUTION) ×3 IMPLANT
PACK ORTHO EXTREMITY (CUSTOM PROCEDURE TRAY) ×3 IMPLANT
PAD ARMBOARD 7.5X6 YLW CONV (MISCELLANEOUS) ×3 IMPLANT
PREVENA RESTOR ARTHOFORM 33X30 (CANNISTER) ×2 IMPLANT
SPONGE LAP 18X18 X RAY DECT (DISPOSABLE) IMPLANT
STAPLER VISISTAT 35W (STAPLE) ×2 IMPLANT
STOCKINETTE IMPERVIOUS LG (DRAPES) ×3 IMPLANT
SUT SILK 2 0 (SUTURE) ×2
SUT SILK 2-0 18XBRD TIE 12 (SUTURE) ×1 IMPLANT
SUT VIC AB 1 CTX 27 (SUTURE) ×4 IMPLANT
TOWEL OR 17X26 10 PK STRL BLUE (TOWEL DISPOSABLE) ×3 IMPLANT
TUBE CONNECTING 12'X1/4 (SUCTIONS) ×1
TUBE CONNECTING 12X1/4 (SUCTIONS) ×2 IMPLANT
YANKAUER SUCT BULB TIP NO VENT (SUCTIONS) ×3 IMPLANT

## 2018-06-08 NOTE — Anesthesia Procedure Notes (Signed)
Anesthesia Regional Block: Popliteal block   Pre-Anesthetic Checklist: ,, timeout performed, Correct Patient, Correct Site, Correct Laterality, Correct Procedure, Correct Position, site marked, Risks and benefits discussed,  Surgical consent,  Pre-op evaluation,  At surgeon's request and post-op pain management  Laterality: Right  Prep: chloraprep       Needles:  Injection technique: Single-shot  Needle Type: Echogenic Needle     Needle Length: 9cm  Needle Gauge: 21     Additional Needles:   Procedures:,,,, ultrasound used (permanent image in chart),,,,  Narrative:  Start time: 06/08/2018 12:10 PM End time: 06/08/2018 12:15 PM Injection made incrementally with aspirations every 5 mL.  Performed by: Personally  Anesthesiologist: Marcene DuosFitzgerald, Boleslaus Holloway, MD

## 2018-06-08 NOTE — Transfer of Care (Signed)
Immediate Anesthesia Transfer of Care Note  Patient: Jackie [name]  Procedure(s) Performed: RIGHT BELOW KNEE AMPUTATION (Right Leg Lower)  Patient Location: PACU  Anesthesia Type:GA combined with regional for post-op pain  Level of Consciousness: drowsy  Airway & Oxygen Therapy: Patient Spontanous Breathing and Patient connected to nasal cannula oxygen  Post-op Assessment: Report given to RN, Post -op Vital signs reviewed and stable and Patient moving all extremities  Post vital signs: Reviewed and stable  Last Vitals:  Vitals Value Taken Time  BP 122/52 06/08/2018  2:03 PM  Temp    Pulse 122 06/08/2018  2:03 PM  Resp 14 06/08/2018  2:05 PM  SpO2 100 % 06/08/2018  2:03 PM  Vitals shown include unvalidated device data.  Last Pain:  Vitals:   06/08/18 0751  TempSrc: Oral  PainSc: 0-No pain      Patients Stated Pain Goal: 2 (06/05/18 0720)  Complications: No apparent anesthesia complications

## 2018-06-08 NOTE — Progress Notes (Signed)
PROGRESS NOTE    Jackie [name]  QMV:[id number] DOB: 10/05/1934 DOA: 06/04/2018 PCP: Mirna MiresHill, Gerald, MD    Brief Narrative:  83 year old female who presented after a mechanical fall.  Reported right lower extremity pain.  In the emergency department her right foot was noted to have dry gangrene.  The history was limited.  On the initial physical examination blood pressure 137/84, heart rate 121, oxygen saturation 98%, moist mucous membranes, lungs clear to auscultation bilaterally, heart S1-S2 present rhythmic, abdomen soft nontender, lower extremities with dry gangrene on the right, up to the mid calf, foul-smelling, no palpable pulses, positive left BKA.   The patient patient was admitted to the hospital working diagnosis of right extremity chronic gangrene  Assessment & Plan:   Principal Problem:   Gangrene (HCC) Active Problems:   ESRF (end stage renal failure) (HCC)   HTN (hypertension), benign   Diabetes mellitus (HCC)   Right foot ulcer, with necrosis of muscle (HCC)   Atrial fibrillation (HCC)   Essential hypertension   Mixed hyperlipidemia   PAD (peripheral artery disease) (HCC)   Long term (current) use of anticoagulants [Z79.01]   1. Right foot gangrene.  (presnet on admission) in the setting of peripheral vascular disease. Patient underwent right below the knee amputation, will continue pain control, follow with physical therapy and orthopedic recommendations. On Iv Zosyn, that likely can be dc in am.   2. Chronica atrial fibrillation. Currently rate control, will continue telemetry monitoring. Will resume anticoagulation in am. No need for bridging with heparin.   3. T2DM with hypoglycemic episodes. Will continue glucose cover and monitoring with insulin sliding scale, fasting capillary glucose this am at 158. Hold on long acting insulin for now.   4. 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 Clinically not hyerpvolemic, blood pressure has  been stable, will continue HD per nephrology recommendations. Continue sensipar, calcitriol and phoslo.   5. HTN. Continue blood pressure monitoring, had episodic hypotension.   6. Thrombocytopenia. Multifactorial.   7. Protein calorie malnutrition, unspecified.Continue nutritional supplements.   DVT prophylaxis: heparin IV   Code Status:  full Family Communication: no family at the bedside  Disposition Plan/ discharge barriers: pending post op recommendations.   Body mass index is 18.94 kg/m. Malnutrition Type:  Nutrition Problem: Increased nutrient needs Etiology: acute illness, chronic illness    Malnutrition Characteristics:  Signs/Symptoms: estimated needs   Nutrition Interventions:  Interventions: Nepro shake  RN Pressure Injury Documentation: No pressure ulcers     Consultants:   Nephrology   Orthopedics  Procedures:     Antimicrobials:       Subjective: Somnolent this am, no chest pain or dyspnea. Easy to arouse, positive right foot pain, no nausea or vomiting.   Objective: Vitals:   06/07/18 1712 06/07/18 2005 06/07/18 2300 06/08/18 0751  BP: 117/80 (!) 92/53 (!) 104/22 (!) 67/29  Pulse: (!) 102 (!) 132 (!) 109 (!) 101  Resp: 18 16 20 10   Temp: 98 F (36.7 C) 98.6 F (37 C) 98.1 F (36.7 C) 98.3 F (36.8 C)  TempSrc: Oral Oral Oral Oral  SpO2: 98% 100% 100% 100%  Weight: 56.5 kg     Height:        Intake/Output Summary (Last 24 hours) at 06/08/2018 0755 Last data filed at 06/08/2018 0300 Gross per 24 hour  Intake 699.27 ml  Output 300 ml  Net 399.27 ml   Filed Weights   06/04/18 1900 06/07/18 1312 06/07/18 1712  Weight: 56.7 kg 56.7 kg 56.5 kg    Examination:   General: deconditioned and ill looking appearing  Neurology: Awake and alert, non focal  E ENT: mild pallor, no icterus, oral mucosa moist Cardiovascular: No JVD. S1-S2 present, rhythmic, no gallops, rubs, or murmurs. No lower extremity edema. Pulmonary: decreased  breath sounds bilaterally but adequate air movement, no wheezing, rhonchi or rales. Gastrointestinal. Abdomen with no organomegaly, non tender, no rebound or guarding Skin. Right foot toes gangrene.  Musculoskeletal: no joint deformities Left upper extremity fistula.     Data Reviewed: I have personally reviewed following labs and imaging studies  CBC: Recent Labs  Lab 06/02/18 1905 06/04/18 1055 06/05/18 0258 06/06/18 0222 06/07/18 0506  WBC 11.6* 8.5 8.3 9.4 10.7*  NEUTROABS 9.5* 7.3  --  7.9*  --   HGB 10.7* 9.6* 8.8* 9.1* 9.2*  HCT 33.8* 30.2* 28.0* 29.7* 30.2*  MCV 90.6 89.1 88.1 88.9 89.1  PLT 146* 139* 137* 132* 136*   Basic Metabolic Panel: Recent Labs  Lab 06/02/18 1905 06/04/18 1055 06/05/18 0258 06/06/18 0222 06/07/18 0518  NA 135 138 138 134* 136  K 3.9 3.2* 3.3* 3.9 3.7  CL 98 99 100 97* 98  CO2 23 26 23 24 28   GLUCOSE 78 139* 132* 161* 35*  BUN 8 28* 37* 42* 21  CREATININE 2.68* 5.51* 6.51* 7.55* 4.68*  CALCIUM 8.5* 9.1 9.0 9.0 8.4*  MG  --   --   --  2.2  --   PHOS  --   --   --  5.3*  --    GFR: Estimated Creatinine Clearance: 8.4 mL/min (A) (by C-G formula based on SCr of 4.68 mg/dL (H)). Liver Function Tests: Recent Labs  Lab 06/02/18 1905 06/04/18 1055 06/06/18 0222  AST 36 28  --   ALT 17 17  --   ALKPHOS 71 70  --   BILITOT 2.3* 1.3*  --   PROT 7.0 6.6  --   ALBUMIN 2.5* 2.4* 2.1*   No results for input(s): LIPASE, AMYLASE in the last 168 hours. No results for input(s): AMMONIA in the last 168 hours. Coagulation Profile: Recent Labs  Lab 06/03/18 06/04/18 1055 06/05/18 0258 06/05/18 1450 06/07/18 0506  INR 4.5* 3.02 2.87 2.33 1.92   Cardiac Enzymes: Recent Labs  Lab 06/02/18 1905  TROPONINI 0.04*   BNP (last 3 results) No results for input(s): PROBNP in the last 8760 hours. HbA1C: No results for input(s): HGBA1C in the last 72 hours. CBG: Recent Labs  Lab 06/07/18 1135 06/07/18 1320 06/07/18 1748 06/07/18 2006  06/08/18 0003  GLUCAP 143* 103* 95 169* 158*   Lipid Profile: Recent Labs    06/08/18 0643  CHOL 67  HDL 21*  LDLCALC 33  TRIG 66  CHOLHDL 3.2   Thyroid Function Tests: No results for input(s): TSH, T4TOTAL, FREET4, T3FREE, THYROIDAB in the last 72 hours. Anemia Panel: No results for input(s): VITAMINB12, FOLATE, FERRITIN, TIBC, IRON, RETICCTPCT in the last 72 hours.    Radiology Studies: I have reviewed all of the imaging during this hospital visit personally     Scheduled Meds: . calcitRIOL  2 mcg Oral Q T,Th,Sa-HD  . calcium acetate  2,001 mg Oral TID WC  . Chlorhexidine Gluconate Cloth  6 each Topical Q0600  . cinacalcet  30 mg Oral Q supper  . [START ON 06/14/2018] darbepoetin (ARANESP) injection - DIALYSIS  100 mcg Intravenous Q Tue-HD  . feeding supplement (NEPRO CARB STEADY)  237  mL Oral BID BM  . heparin  1,800 Units Dialysis Once in dialysis  . multivitamin  1 tablet Oral QHS  . sevelamer carbonate  2.4 g Oral TID WC   Continuous Infusions: . sodium chloride    . sodium chloride    .  ceFAZolin (ANCEF) IV    . heparin 950 Units/hr (06/07/18 2340)  . piperacillin-tazobactam (ZOSYN)  IV 3.375 g (06/07/18 2300)     LOS: 4 days        Gennie Dib Annett Gula, MD [facility] Pager [phone]

## 2018-06-08 NOTE — Progress Notes (Signed)
ICE pack to post -op area applied, no bleeding noted, denied any discomfort.

## 2018-06-08 NOTE — Progress Notes (Signed)
[facility] Progress Note   Assessment/ Plan:   Dialysis Orders: Center: [facility]   on TTS . EDW 55.5 HD Bath 2k, 2.25 ca  Time 4hr Heparin 1800. Access LUA AVGG     Calcitriol 2 mcg po /HD  Mircera 50 mg q 2 wks (last 05/14/18)  Assessment/Plan 1. RLE gangrene: on antibiotics (Zosyn) per primary, ortho consulted.  Plan for BKA this admit- today 2. + Pericardial rub- most likely d/t underdialysis- BUN of 42 this AM which isn't terribly high but unmeasured uremic toxins likely contributing.  HD 11/18 and today 11/19.  Cardiology has seen, appreciate assistance.  No other recs than intensified HD reg.  TTE pending.  No clinical signs of tamponade 3. ESRD -  HD TTS Schedule, Missed hd 11/16.  HD daily for uremic pericarditis--> however today will be break day due to BKA. 4. Atrial Fibrillation - inr 2.87.  Coumadin as OP 5. Hypertension/volume  - bp and vol stable, no op bp meds  6. Anemia  - hgb 9.6  ESA due 11/26 7. Metabolic bone disease - ca corec 10.3  On sensipar 30 mg q d, phoslo 3 ac and Renvela listed as binders .po vit d on hd.  Will switch no non-Ca based binder 8. DM type 2 - per admit  9. Nutrition - alb 2.4 , renal carb mod diet ,nepro  Supplement, renal vit 10. Dispo: pending  Subjective:    Seen just before going to OR for R BKA.  No complaints.       Objective:   BP (!) 85/39 (BP Location: Right Wrist)   Pulse (!) 101   Temp 98.3 F (36.8 C) (Oral)   Resp 10   Ht 5\' 8"  (1.727 m)   Wt 56.5 kg   SpO2 100%   BMI 18.94 kg/m   Physical Exam: Gen: elderly woman, NAD, lying in bed CVS: irregular + pericardial rub heard throughout precordium but loudest at LUSB, unchanged Resp: clear bilaterally ION:[id number]:nondistended nontender Ext: RLE with gangrenous toes and foot ACCESS: LUE AVG + T/B  Labs: BMET Recent Labs  Lab 06/02/18 1905 06/04/18 1055 06/05/18 0258 06/06/18 0222 06/07/18 0518 06/08/18 1125  NA 135 138 138 134* 136 133*  K 3.9 3.2* 3.3* 3.9  3.7 6.7*  CL 98 99 100 97* 98  --   CO2 23 26 23 24 28   --   GLUCOSE 78 139* 132* 161* 35* 78  BUN 8 28* 37* 42* 21  --   CREATININE 2.68* 5.51* 6.51* 7.55* 4.68*  --   CALCIUM 8.5* 9.1 9.0 9.0 8.4*  --   PHOS  --   --   --  5.3*  --   --    CBC Recent Labs  Lab 06/02/18 1905 06/04/18 1055 06/05/18 0258 06/06/18 0222 06/07/18 0506 06/08/18 0643 06/08/18 1125  WBC 11.6* 8.5 8.3 9.4 10.7* 10.6*  --   NEUTROABS 9.5* 7.3  --  7.9*  --   --   --   HGB 10.7* 9.6* 8.8* 9.1* 9.2* 8.8* 9.9*  HCT 33.8* 30.2* 28.0* 29.7* 30.2* 28.2* 29.0*  MCV 90.6 89.1 88.1 88.9 89.1 90.1  --   PLT 146* 139* 137* 132* 136* 116*  --     @IMGRELPRIORS @ Medications:    . [MAR Hold] calcitRIOL  2 mcg Oral Q T,Th,Sa-HD  . [MAR Hold] calcium acetate  2,001 mg Oral TID WC  . chlorhexidine      . [MAR Hold] Chlorhexidine Gluconate Cloth  6 each Topical Q0600  . [MAR Hold] cinacalcet  30 mg Oral Q supper  . [MAR Hold] darbepoetin (ARANESP) injection - DIALYSIS  100 mcg Intravenous Q Tue-HD  . [MAR Hold] feeding supplement (NEPRO CARB STEADY)  237 mL Oral BID BM  . [MAR Hold] heparin  1,800 Units Dialysis Once in dialysis  . [MAR Hold] multivitamin  1 tablet Oral QHS  . [MAR Hold] sevelamer carbonate  2.4 g Oral TID WC     Bufford Buttner, MD [facility] pgr [phone] 06/08/2018, 11:30 AM

## 2018-06-08 NOTE — Op Note (Signed)
   Date of Surgery: 06/08/2018  INDICATIONS: [name] is a 83 y.o.-year-old female who has dry gangrene of the right lower extremities from the ankle distally.Marland Kitchen.  PREOPERATIVE DIAGNOSIS: Dry gangrene right foot  POSTOPERATIVE DIAGNOSIS: Same.  PROCEDURE: Transtibial amputation Application of Prevena wound VAC  SURGEON: Lajoyce Cornersuda, M.D.  ANESTHESIA:  general  IV FLUIDS AND URINE: See anesthesia.  ESTIMATED BLOOD LOSS: Minimal mL.  COMPLICATIONS: None.  DESCRIPTION OF PROCEDURE: The patient was brought to the operating room and underwent a general anesthetic. After adequate levels of anesthesia were obtained patient's lower extremity was prepped using DuraPrep draped into a sterile field. A timeout was called. The foot was draped out of the sterile field with impervious stockinette. A transverse incision was made 11 cm distal to the tibial tubercle. This curved proximally and a large posterior flap was created. The tibia was transected 1 cm proximal to the skin incision. The fibula was transected just proximal to the tibial incision. The tibia was beveled anteriorly. A large posterior flap was created. The sciatic nerve was pulled cut and allowed to retract. The vascular bundles were suture ligated with 2-0 silk. The deep and superficial fascial layers were closed using #1 Vicryl. The skin was closed using staples and 2-0 nylon. The wound was covered with a Prevena restore and customizable wound VAC. There was a good suction fit. A prosthetic shrinker will be applied. Patient was extubated taken to the PACU in stable condition.   DISCHARGE PLANNING:  Antibiotic duration: Continue IV antibiotics for 24 hours postoperatively  Weightbearing: Nonweightbearing on the right transfer training only  Pain medication: Opioid pathway ordered  Dressing care/ Wound VAC: Continue wound VAC at discharge  Discharge to: Skilled nursing facility  Follow-up: In the office 1 week post  operative.  Jackie BakerMarcus Jorgen Wolfinger, MD [facility] 1:16 PM

## 2018-06-08 NOTE — Progress Notes (Signed)
  Echocardiogram 2D Echocardiogram has been performed.  Dorena Dewiffany G Kateria Cutrona 06/08/2018, 4:37 PM

## 2018-06-08 NOTE — Progress Notes (Signed)
TRANSPORTED TO THE  OR BY BED AWAKE AND ALERT.

## 2018-06-08 NOTE — Anesthesia Preprocedure Evaluation (Addendum)
Anesthesia Evaluation  Patient identified by MRN, date of birth, ID band Patient awake    Reviewed: Allergy & Precautions, NPO status , Patient's Chart, lab work & pertinent test results  Airway Mallampati: II  TM Distance: >3 FB     Dental  (+) Dental Advisory Given   Pulmonary neg pulmonary ROS,    breath sounds clear to auscultation       Cardiovascular hypertension, + Peripheral Vascular Disease   Rhythm:Regular Rate:Normal     Neuro/Psych  Neuromuscular disease    GI/Hepatic negative GI ROS, Neg liver ROS,   Endo/Other  diabetes, Type 2, Insulin Dependent  Renal/GU ESRF and DialysisRenal disease     Musculoskeletal  (+) Arthritis ,   Abdominal   Peds  Hematology  (+) anemia ,   Anesthesia Other Findings   Reproductive/Obstetrics                             Lab Results  Component Value Date   WBC 10.6 (H) 06/08/2018   HGB 10.2 (L) 06/08/2018   HCT 30.0 (L) 06/08/2018   MCV 90.1 06/08/2018   PLT 116 (L) 06/08/2018   Lab Results  Component Value Date   CREATININE 4.68 (H) 06/07/2018   BUN 21 06/07/2018   NA 140 06/08/2018   K 4.3 06/08/2018   CL 98 06/07/2018   CO2 28 06/07/2018    Anesthesia Physical Anesthesia Plan  ASA: III  Anesthesia Plan: General   Post-op Pain Management:    Induction: Intravenous  PONV Risk Score and Plan: 3 and Dexamethasone, Ondansetron and Treatment may vary due to age or medical condition  Airway Management Planned: LMA  Additional Equipment:   Intra-op Plan:   Post-operative Plan: Extubation in OR  Informed Consent: I have reviewed the patients History and Physical, chart, labs and discussed the procedure including the risks, benefits and alternatives for the proposed anesthesia with the patient or authorized representative who has indicated his/her understanding and acceptance.   Dental advisory given  Plan Discussed with:  CRNA  Anesthesia Plan Comments:         Anesthesia Quick Evaluation

## 2018-06-08 NOTE — Progress Notes (Signed)
Back from PACU awake and alert. Wound vac to right stump intact   with no output noted.

## 2018-06-08 NOTE — Anesthesia Procedure Notes (Signed)
Anesthesia Regional Block: Adductor canal block   Pre-Anesthetic Checklist: ,, timeout performed, Correct Patient, Correct Site, Correct Laterality, Correct Procedure, Correct Position, site marked, Risks and benefits discussed,  Surgical consent,  Pre-op evaluation,  At surgeon's request and post-op pain management  Laterality: Right  Prep: chloraprep       Needles:  Injection technique: Single-shot  Needle Type: Echogenic Needle     Needle Length: 9cm  Needle Gauge: 21     Additional Needles:   Procedures:,,,, ultrasound used (permanent image in chart),,,,  Narrative:  Start time: 06/08/2018 12:16 PM End time: 06/08/2018 12:21 PM Injection made incrementally with aspirations every 5 mL.  Performed by: Personally  Anesthesiologist: Marcene DuosFitzgerald, Tyah Acord, MD

## 2018-06-08 NOTE — Progress Notes (Signed)
ANTICOAGULATION CONSULT NOTE   Pharmacy Consult for Heparin (warfarin on hold) Indication: atrial fibrillation  No Known Allergies  Patient Measurements: Height: 5\' 8"  (172.7 cm) Weight: 124 lb 9 oz (56.5 kg) IBW/kg (Calculated) : 63.9 Heparin Dosing Weight: 56.7 kg  Vital Signs: Temp: 98.3 F (36.8 C) (11/20 0751) Temp Source: Oral (11/20 0751) BP: 85/39 (11/20 0751) Pulse Rate: 101 (11/20 0751)  Labs: Recent Labs    06/05/18 1450  06/06/18 0222 06/07/18 0506 06/07/18 0518 06/07/18 2200 06/08/18 0643  HGB  --    < > 9.1* 9.2*  --   --  8.8*  HCT  --   --  29.7* 30.2*  --   --  28.2*  PLT  --   --  132* 136*  --   --  116*  LABPROT 25.3*  --   --  21.7*  --   --  21.3*  INR 2.33  --   --  1.92  --   --  1.88  HEPARINUNFRC  --   --   --   --   --  0.11* 0.25*  CREATININE  --   --  7.55*  --  4.68*  --   --    < > = values in this interval not displayed.    Estimated Creatinine Clearance: 8.4 mL/min (A) (by C-G formula based on SCr of 4.68 mg/dL (H)).   Medical History: Past Medical History:  Diagnosis Date  . Anemia   . Arthritis   . Diabetes mellitus   . End-stage renal disease on hemodialysis (HCC)    dialysis T/Th/Sa  . Hx of BKA, left (HCC)   . Hyperlipidemia   . Hypertension   . Pneumonia     Medications:  Scheduled:  . calcitRIOL  2 mcg Oral Q T,Th,Sa-HD  . calcium acetate  2,001 mg Oral TID WC  . Chlorhexidine Gluconate Cloth  6 each Topical Q0600  . cinacalcet  30 mg Oral Q supper  . [START ON 06/14/2018] darbepoetin (ARANESP) injection - DIALYSIS  100 mcg Intravenous Q Tue-HD  . feeding supplement (NEPRO CARB STEADY)  237 mL Oral BID BM  . heparin  1,800 Units Dialysis Once in dialysis  . multivitamin  1 tablet Oral QHS  . sevelamer carbonate  2.4 g Oral TID WC    Assessment: 83 yof awaiting plans for [facility] on 11/20 - on warfarin PTA. Home regimen is 5 mg daily (last dose on 11/15). INR on admission 3.02. Received 1 dose vitamin K 5 mg PO on  11/16. Known ESRD patient.   INR is 1.88 today. Heparin level is subtherapeutic at 0.25, on 950 units/hr. Hgb 8.8, plt 116. No s/sx of bleeding. No infusion issues.    Goal of Therapy:  Heparin level 0.3-0.7 units/ml Monitor platelets by anticoagulation protocol: Yes   Plan:  Increase heparin to 1050 units/hr Re-check heparin level in 8 hours Monitor daily HL, CBC, and for s/sx of bleeding  Sherron MondayKimberly Manoj Enriquez, PharmD Clinical Pharmacist  Pager: [phone] Phone: [phone]

## 2018-06-08 NOTE — Anesthesia Procedure Notes (Signed)
Procedure Name: LMA Insertion Date/Time: 06/08/2018 12:54 PM Performed by: Burt Ekurner, Maurianna Benard Ashley, CRNA Pre-anesthesia Checklist: Patient identified, Emergency Drugs available, Suction available and Patient being monitored Patient Re-evaluated:Patient Re-evaluated prior to induction Oxygen Delivery Method: Circle system utilized Preoxygenation: Pre-oxygenation with 100% oxygen Induction Type: IV induction Ventilation: Mask ventilation without difficulty LMA: LMA inserted LMA Size: 4.0 Number of attempts: 1 Placement Confirmation: positive ETCO2 and breath sounds checked- equal and bilateral Tube secured with: Tape Dental Injury: Teeth and Oropharynx as per pre-operative assessment

## 2018-06-08 NOTE — Interval H&P Note (Signed)
History and Physical Interval Note:  06/08/2018 6:49 AM  Jackie [name]  has presented today for surgery, with the diagnosis of Gangrene Right Foot  The various methods of treatment have been discussed with the patient and family. After consideration of risks, benefits and other options for treatment, the patient has consented to  Procedure(s): RIGHT BELOW KNEE AMPUTATION (Right) as a surgical intervention .  The patient's history has been reviewed, patient examined, no change in status, stable for surgery.  I have reviewed the patient's chart and labs.  Questions were answered to the patient's satisfaction.     Nadara MustardMarcus V Salah Burlison

## 2018-06-09 ENCOUNTER — Encounter (HOSPITAL_COMMUNITY): Payer: Self-pay | Admitting: Orthopedic Surgery

## 2018-06-09 LAB — POCT I-STAT 4, (NA,K, GLUC, HGB,HCT)
Glucose, Bld: 78 mg/dL (ref 70–99)
HCT: 29 % — ABNORMAL LOW (ref 36.0–46.0)
HEMOGLOBIN: 9.9 g/dL — AB (ref 12.0–15.0)
Potassium: 6.7 mmol/L (ref 3.5–5.1)
SODIUM: 133 mmol/L — AB (ref 135–145)

## 2018-06-09 LAB — CBC WITH DIFFERENTIAL/PLATELET
Abs Immature Granulocytes: 0.05 10*3/uL (ref 0.00–0.07)
BASOS ABS: 0 10*3/uL (ref 0.0–0.1)
Basophils Relative: 0 %
EOS ABS: 0 10*3/uL (ref 0.0–0.5)
EOS PCT: 0 %
HEMATOCRIT: 28.9 % — AB (ref 36.0–46.0)
HEMOGLOBIN: 8.6 g/dL — AB (ref 12.0–15.0)
Immature Granulocytes: 1 %
LYMPHS ABS: 0.4 10*3/uL — AB (ref 0.7–4.0)
LYMPHS PCT: 5 %
MCH: 27.3 pg (ref 26.0–34.0)
MCHC: 29.8 g/dL — ABNORMAL LOW (ref 30.0–36.0)
MCV: 91.7 fL (ref 80.0–100.0)
MONO ABS: 0.6 10*3/uL (ref 0.1–1.0)
Monocytes Relative: 7 %
NRBC: 0 % (ref 0.0–0.2)
Neutro Abs: 6.9 10*3/uL (ref 1.7–7.7)
Neutrophils Relative %: 87 %
Platelets: 151 10*3/uL (ref 150–400)
RBC: 3.15 MIL/uL — AB (ref 3.87–5.11)
RDW: 18.3 % — AB (ref 11.5–15.5)
WBC: 8 10*3/uL (ref 4.0–10.5)

## 2018-06-09 LAB — GLUCOSE, CAPILLARY
Glucose-Capillary: 182 mg/dL — ABNORMAL HIGH (ref 70–99)
Glucose-Capillary: 142 mg/dL — ABNORMAL HIGH (ref 70–99)
Glucose-Capillary: 114 mg/dL — ABNORMAL HIGH (ref 70–99)

## 2018-06-09 LAB — BASIC METABOLIC PANEL
Anion gap: 11 (ref 5–15)
BUN: 24 mg/dL — AB (ref 8–23)
CALCIUM: 8.6 mg/dL — AB (ref 8.9–10.3)
CO2: 22 mmol/L (ref 22–32)
Chloride: 103 mmol/L (ref 98–111)
Creatinine, Ser: 5.3 mg/dL — ABNORMAL HIGH (ref 0.44–1.00)
GFR calc Af Amer: 8 mL/min — ABNORMAL LOW (ref >60)
GFR calc non Af Amer: 7 mL/min — ABNORMAL LOW (ref >60)
Glucose, Bld: 186 mg/dL — ABNORMAL HIGH (ref 70–99)
Potassium: 4.5 mmol/L (ref 3.5–5.1)
Sodium: 136 mmol/L (ref 135–145)

## 2018-06-09 LAB — PROTIME-INR
INR: 1.85
Prothrombin Time: 21.1 seconds — ABNORMAL HIGH (ref 11.4–15.2)

## 2018-06-09 LAB — CULTURE, BLOOD (ROUTINE X 2)
CULTURE: NO GROWTH
CULTURE: NO GROWTH
Special Requests: ADEQUATE
Special Requests: ADEQUATE

## 2018-06-09 MED ORDER — OXYCODONE HCL 5 MG PO TABS
ORAL_TABLET | ORAL | Status: AC
  Filled 2018-06-09: qty 2

## 2018-06-09 MED ORDER — CALCITRIOL 0.5 MCG PO CAPS
ORAL_CAPSULE | ORAL | Status: AC
  Filled 2018-06-09: qty 4

## 2018-06-09 NOTE — Evaluation (Signed)
Physical Therapy Evaluation Patient Details Name: Jackie [name] MRN: [id number] DOB: 12-Jun-1935 Today's Date: 06/09/2018   History of Present Illness  Pt is a 83 y.o. F with significant PMH of previous left transtibial amputation, ESRD, diabetes mellitus, who presents with dry gangrene of right foot. Now s/p right transtibial amputation.  Clinical Impression  Pt admitted with above diagnosis. Pt currently with functional limitations due to the deficits listed below (see PT Problem List). Prior to admission, patient states she was modified independent with walking using left lower extremity prosthetic and independent with ADL's. Currently, patient requiring two person maximal assistance for progressing to edge of bed and maximal assistance for rolling to both sides to perform peri care (patient with bowel incontinence during session and RN notified).  HR elevated from 107-120s bpm with mobility. Further transfer to chair deferred secondary to patient leaving soon for dialysis. Pt will benefit from skilled PT to increase their independence and safety with mobility to allow discharge to the venue listed below.       Follow Up Recommendations SNF (prefers [facility])    Equipment Recommendations  Other (comment)(defer)    Recommendations for Other Services OT consult     Precautions / Restrictions Precautions Precautions: Fall Precaution Comments: Previous L BKA, s/p R BKA Restrictions Weight Bearing Restrictions: Yes Other Position/Activity Restrictions: right residual limb shrinker/limb guard      Mobility  Bed Mobility Overal bed mobility: Needs Assistance Bed Mobility: Supine to Sit;Sit to Supine;Rolling Rolling: Max assist   Supine to sit: Max assist;+2 for physical assistance Sit to supine: Max assist;+2 for physical assistance   General bed mobility comments: Patient with good initiation and verbal cues provided for use of bed rail. Requiring maxA + 2 for trunk elevation and  use of bed pad to scoot hips to edge of bed. MaxA + 1 for rolling to left and right to perform peri care and change bed linens  Transfers                 General transfer comment: deferred  Ambulation/Gait                Stairs            Wheelchair Mobility    Modified Rankin (Stroke Patients Only)       Balance Overall balance assessment: Needs assistance Sitting-balance support: Bilateral upper extremity supported Sitting balance-Leahy Scale: Fair                                       Pertinent Vitals/Pain Pain Assessment: Faces Faces Pain Scale: Hurts little more Pain Location: right residual limb with movement Pain Descriptors / Indicators: Grimacing Pain Intervention(s): Monitored during session;Limited activity within patient's tolerance    Home Living Family/patient expects to be discharged to:: Private residence Living Arrangements: Alone Available Help at Discharge: Family;Available PRN/intermittently Type of Home: Apartment Home Access: Level entry     Home Layout: One level Home Equipment: Wheelchair - [employer] - 2 wheels;Shower seat      Prior Function Level of Independence: Independent with assistive device(s)         Comments: Uses prosthetic for ambulation. States she is independent with ADL's     Hand Dominance        Extremity/Trunk Assessment   Upper Extremity Assessment Upper Extremity Assessment: Generalized weakness    Lower Extremity Assessment Lower Extremity Assessment: RLE deficits/detail;LLE  deficits/detail RLE Deficits / Details: s/p R BKA. At least anti gravity strength LLE Deficits / Details: Previous L BKA. Actively achieving ~5 degrees from neutral extension. Strength WFL       Communication   Communication: No difficulties  Cognition Arousal/Alertness: Awake/alert Behavior During Therapy: WFL for tasks assessed/performed Overall Cognitive Status: Within Functional Limits  for tasks assessed                                        General Comments      Exercises Amputee Exercises Hip Flexion/Marching: 10 reps;Left;Seated Knee Extension: 10 reps;Left;Seated   Assessment/Plan    PT Assessment Patient needs continued PT services  PT Problem List Decreased strength;Decreased range of motion;Decreased activity tolerance;Decreased balance;Decreased mobility;Decreased coordination;Pain       PT Treatment Interventions DME instruction;Functional mobility training;Therapeutic activities;Therapeutic exercise;Balance training;Patient/family education;Wheelchair mobility training    PT Goals (Current goals can be found in the Care Plan section)  Acute Rehab PT Goals Patient Stated Goal: [facility] PT Goal Formulation: With patient Time For Goal Achievement: 06/23/18 Potential to Achieve Goals: Fair    Frequency Min 2X/week   Barriers to discharge Decreased caregiver support      Co-evaluation               AM-PAC PT "6 Clicks" Daily Activity  Outcome Measure Difficulty turning over in bed (including adjusting bedclothes, sheets and blankets)?: Unable Difficulty moving from lying on back to sitting on the side of the bed? : Unable Difficulty sitting down on and standing up from a chair with arms (e.g., wheelchair, bedside commode, etc,.)?: Unable Help needed moving to and from a bed to chair (including a wheelchair)?: A Lot Help needed walking in hospital room?: Total Help needed climbing 3-5 steps with a railing? : Total 6 Click Score: 7    End of Session   Activity Tolerance: Patient tolerated treatment well Patient left: in bed;with call bell/phone within reach Nurse Communication: Mobility status(bowel movement) PT Visit Diagnosis: Other abnormalities of gait and mobility (R26.89);Muscle weakness (generalized) (M62.81)    Time: 9629-52841130-1152 PT Time Calculation (min) (ACUTE ONLY): 22 min   Charges:   PT  Evaluation $PT Eval Moderate Complexity: 1 Mod          Laurina Bustlearoline Lekeisha Arenas, [place]PT, DPT Acute Rehabilitation Services Pager [phone] Office [phone]   Vanetta MuldersCarloine H Ahnesty Finfrock 06/09/2018, 12:16 PM

## 2018-06-09 NOTE — Progress Notes (Signed)
Patient ID: Jackie [name], female   DOB: 11/13/1934, 83 y.o.   MRN: [id number] Patient is postoperative day 1 right transtibial amputation.  There is no drainage in the wound VAC canister.  We will consult with [employer] for a stump shrinker.

## 2018-06-09 NOTE — Progress Notes (Signed)
[facility] Progress Note   Assessment/ Plan:   Dialysis Orders: Center: [facility]   on TTS . EDW 55.5 HD Bath 2k, 2.25 ca  Time 4hr Heparin 1800. Access LUA AVGG     Calcitriol 2 mcg po /HD  Mircera 50 mg q 2 wks (last 05/14/18)  Assessment/Plan 1. RLE gangrene: on antibiotics (Zosyn) per primary, ortho consulted.  S/p BKA 11/20 with Dr Lajoyce Corners, has woundvac. 2. + Pericardial rub- most likely d/t underdialysis- BUN of 42 this AM which isn't terribly high but unmeasured uremic toxins likely contributing.  HD 11/18 11/19, again today 11/21.  Cardiology has seen, appreciate assistance.  No other recs than intensified HD reg.  TTE with trivial effusion.  No clinical signs of tamponade 3. ESRD -  HD TTS Schedule, Missed hd 11/16.  HD daily for uremic pericarditis--> however today will be break day due to BKA. 4. Atrial Fibrillation - inr 2.87.  Coumadin as OP 5. Hypertension/volume  - bp and vol stable, no op bp meds  6. Anemia  - hgb 9.6  ESA due 11/26 7. Metabolic bone disease - ca corec 10.3  On sensipar 30 mg q d, phoslo 3 ac and Renvela listed as binders .po vit d on hd.  Will switch no non-Ca based binder 8. DM type 2 - per admit  9. Nutrition - alb 2.4 , renal carb mod diet ,nepro  Supplement, renal vit 10. Dispo: pending  Subjective:    S/p R BKA.  Feeling better, BP better, eating breakfast but having some nausea this AM.     Objective:   BP 110/69 (BP Location: Right Arm)   Pulse 98   Temp 97.7 F (36.5 C) (Oral)   Resp 17   Ht 5\' 8"  (1.727 m)   Wt 56.5 kg   SpO2 100%   BMI 18.94 kg/m   Physical Exam: Gen: elderly woman, sitting in bed, eating but emesis bucket needed CVS: irregular + pericardial rub heard throughout precordium but loudest at LUSB, unchanged Resp: clear bilaterally ZOX:[id number] nontender Ext: RLE s/p BKA and woundvac ACCESS: LUE AVG + T/B  Labs: BMET Recent Labs  Lab 06/02/18 1905 06/04/18 1055 06/05/18 0258 06/06/18 0222  06/07/18 0518 06/08/18 1125 06/08/18 1204 06/08/18 1633  NA 135 138 138 134* 136 133* 140 138  K 3.9 3.2* 3.3* 3.9 3.7 6.7* 4.3 4.5  CL 98 99 100 97* 98  --   --  104  CO2 23 26 23 24 28   --   --  25  GLUCOSE 78 139* 132* 161* 35* 78 83 159*  BUN 8 28* 37* 42* 21  --   --  18  CREATININE 2.68* 5.51* 6.51* 7.55* 4.68*  --   --  4.26*  CALCIUM 8.5* 9.1 9.0 9.0 8.4*  --   --  8.3*  PHOS  --   --   --  5.3*  --   --   --   --    CBC Recent Labs  Lab 06/02/18 1905 06/04/18 1055 06/05/18 0258 06/06/18 0222 06/07/18 0506 06/08/18 0643 06/08/18 1125 06/08/18 1204  WBC 11.6* 8.5 8.3 9.4 10.7* 10.6*  --   --   NEUTROABS 9.5* 7.3  --  7.9*  --   --   --   --   HGB 10.7* 9.6* 8.8* 9.1* 9.2* 8.8* 9.9* 10.2*  HCT 33.8* 30.2* 28.0* 29.7* 30.2* 28.2* 29.0* 30.0*  MCV 90.6 89.1 88.1 88.9 89.1 90.1  --   --  PLT 146* 139* 137* 132* 136* 116*  --   --     @IMGRELPRIORS @ Medications:    . calcitRIOL  2 mcg Oral Q T,Th,Sa-HD  . calcium acetate  2,001 mg Oral TID WC  . Chlorhexidine Gluconate Cloth  6 each Topical Q0600  . cinacalcet  30 mg Oral Q supper  . [START ON 06/14/2018] darbepoetin (ARANESP) injection - DIALYSIS  100 mcg Intravenous Q Tue-HD  . docusate sodium  100 mg Oral BID  . feeding supplement (NEPRO CARB STEADY)  237 mL Oral BID BM  . heparin  1,800 Units Dialysis Once in dialysis  . multivitamin  1 tablet Oral QHS  . sevelamer carbonate  2.4 g Oral TID WC     Bufford ButtnerElizabeth Oveda Dadamo, MD [facility] pgr [phone] 06/09/2018, 8:49 AM

## 2018-06-09 NOTE — Clinical Social Work Note (Signed)
Went by room to discuss SNF placement. Patent off unit for HD. Will try again later.  Charlynn CourtSarah Mose Colaizzi, CSW [phone]

## 2018-06-09 NOTE — Anesthesia Postprocedure Evaluation (Signed)
Anesthesia Post Note  Patient: Barbette HairBetty L Tanney  Procedure(s) Performed: RIGHT BELOW KNEE AMPUTATION (Right Leg Lower)     Patient location during evaluation: PACU Anesthesia Type: General Level of consciousness: awake and alert Pain management: pain level controlled Vital Signs Assessment: post-procedure vital signs reviewed and stable Respiratory status: spontaneous breathing, nonlabored ventilation, respiratory function stable and patient connected to nasal cannula oxygen Cardiovascular status: blood pressure returned to baseline and stable Postop Assessment: no apparent nausea or vomiting Anesthetic complications: no    Last Vitals:  Vitals:   06/09/18 1400 06/09/18 1430  BP: (!) 116/22 (!) 171/66  Pulse: (!) 109 (!) 107  Resp:    Temp:    SpO2:      Last Pain:  Vitals:   06/09/18 1335  TempSrc: Oral  PainSc: 0-No pain                 Kennieth RadFitzgerald, Anah Billard E

## 2018-06-09 NOTE — Progress Notes (Signed)
PROGRESS NOTE    Jackie [name]  ZOX:[id number] DOB: 02-Nov-1934 DOA: 06/04/2018 PCP: Mirna Mires, MD    Brief Narrative:  84 year old female who presented after a mechanical fall.  Reported right lower extremity pain.  In the emergency department her right foot was noted to have dry gangrene.  The history was limited.  On the initial physical examination blood pressure 137/84, heart rate 121, oxygen saturation 98%, moist mucous membranes, lungs clear to auscultation bilaterally, heart S1-S2 present rhythmic, abdomen soft nontender, lower extremities with dry gangrene on the right, up to the mid calf, foul-smelling, no palpable pulses, positive left BKA.   The patient patient was admitted to the hospital working diagnosis of right extremity chronic gangrene   Assessment & Plan:   Principal Problem:   Gangrene (HCC) Active Problems:   ESRF (end stage renal failure) (HCC)   HTN (hypertension), benign   Diabetes mellitus (HCC)   Right foot ulcer, with necrosis of muscle (HCC)   Atrial fibrillation (HCC)   Essential hypertension   Mixed hyperlipidemia   PAD (peripheral artery disease) (HCC)   Long term (current) use of anticoagulants [Z79.01]   1. Right foot gangrene.  (presnet on admission) in the setting of peripheral vascular disease. sp right below the knee amputation,  continue pain control. Plan to continue IV Zosyn for 24 h post op. Continue wound vac per surgery recommendations.   2. Chronica atrial fibrillation. Telemetry monitoring. Will resume anticoagulation with warfarin.   3. T2DM with hypoglycemic episodes. Continue insulin sliding for glucose cover and monitoring.   4. 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 HD per nephrology recommendations. On sensipar, calcitriol and phoslo.   5. HTN. Blood pressure monitoring   6. Thrombocytopenia. Multifactorial. Improving, today at 151.   7. Protein calorie malnutrition, unspecified.  On nutritional supplements.   DVT prophylaxis: heparin IV   Code Status:  full Family Communication: no family at the bedside  Disposition Plan/ discharge barriers: pending post op recommendations.   Body mass index is 18.94 kg/m. Malnutrition Type:  Nutrition Problem: Increased nutrient needs Etiology: acute illness, chronic illness   Malnutrition Characteristics:  Signs/Symptoms: estimated needs   Nutrition Interventions:  Interventions: Nepro shake  RN Pressure Injury Documentation:     Consultants:   Nephrology   Orthopedics    Procedures:     Antimicrobials:   Zosyn IV     Subjective: Patient having mild nausea this am, no chest pain or dyspnea, no wound pain. Wound vac in place.   Objective: Vitals:   06/08/18 2055 06/08/18 2300 06/09/18 0300 06/09/18 0745  BP:  (!) 103/59  110/69  Pulse:  (!) 112  98  Resp:  (!) 22  17  Temp: 97.8 F (36.6 C) 98.1 F (36.7 C) 98 F (36.7 C) 97.7 F (36.5 C)  TempSrc: Oral Oral Oral Oral  SpO2:  100%  100%  Weight:      Height:        Intake/Output Summary (Last 24 hours) at 06/09/2018 0807 Last data filed at 06/09/2018 0440 Gross per 24 hour  Intake 1130 ml  Output 50 ml  Net 1080 ml   Filed Weights   06/04/18 1900 06/07/18 1312 06/07/18 1712  Weight: 56.7 kg 56.7 kg 56.5 kg    Examination:   General: deconditioned  Neurology: Awake and alert, non focal  E ENT: mild pallor, no icterus, oral mucosa moist Cardiovascular: No JVD. S1-S2 present, rhythmic, no gallops, no rubs,  systolic murmur 3/6 at the apex. No lower extremity edema. Pulmonary: vesicular breath sounds bilaterally, adequate air movement, no wheezing, rhonchi or rales. Gastrointestinal. Abdomen with no organomegaly, non tender, no rebound or guarding Skin. No rashes Musculoskeletal: wound vac at the right lower extremity.      Data Reviewed: I have personally reviewed following labs and imaging studies  CBC: Recent Labs    Lab 06-12-18 1905 06/04/18 1055 06/05/18 0258 06/06/18 0222 06/07/18 0506 06/08/18 0643 06/08/18 1125 06/08/18 1204  WBC 11.6* 8.5 8.3 9.4 10.7* 10.6*  --   --   NEUTROABS 9.5* 7.3  --  7.9*  --   --   --   --   HGB 10.7* 9.6* 8.8* 9.1* 9.2* 8.8* 9.9* 10.2*  HCT 33.8* 30.2* 28.0* 29.7* 30.2* 28.2* 29.0* 30.0*  MCV 90.6 89.1 88.1 88.9 89.1 90.1  --   --   PLT 146* 139* 137* 132* 136* 116*  --   --    Basic Metabolic Panel: Recent Labs  Lab 06/04/18 1055 06/05/18 0258 06/06/18 0222 06/07/18 0518 06/08/18 1125 06/08/18 1204 06/08/18 1633  NA 138 138 134* 136 133* 140 138  K 3.2* 3.3* 3.9 3.7 6.7* 4.3 4.5  CL 99 100 97* 98  --   --  104  CO2 26 23 24 28   --   --  25  GLUCOSE 139* 132* 161* 35* 78 83 159*  BUN 28* 37* 42* 21  --   --  18  CREATININE 5.51* 6.51* 7.55* 4.68*  --   --  4.26*  CALCIUM 9.1 9.0 9.0 8.4*  --   --  8.3*  MG  --   --  2.2  --   --   --   --   PHOS  --   --  5.3*  --   --   --   --    GFR: Estimated Creatinine Clearance: 9.2 mL/min (A) (by C-G formula based on SCr of 4.26 mg/dL (H)). Liver Function Tests: Recent Labs  Lab 12-Jun-2018 1905 06/04/18 1055 06/06/18 0222  AST 36 28  --   ALT 17 17  --   ALKPHOS 71 70  --   BILITOT 2.3* 1.3*  --   PROT 7.0 6.6  --   ALBUMIN 2.5* 2.4* 2.1*   No results for input(s): LIPASE, AMYLASE in the last 168 hours. No results for input(s): AMMONIA in the last 168 hours. Coagulation Profile: Recent Labs  Lab 06/04/18 1055 06/05/18 0258 06/05/18 1450 06/07/18 0506 06/08/18 0643  INR 3.02 2.87 2.33 1.92 1.88   Cardiac Enzymes: Recent Labs  Lab 06-12-18 1905  TROPONINI 0.04*   BNP (last 3 results) No results for input(s): PROBNP in the last 8760 hours. HbA1C: No results for input(s): HGBA1C in the last 72 hours. CBG: Recent Labs  Lab 06/08/18 0754 06/08/18 1020 06/08/18 1352 06/08/18 1643 06/08/18 2115  GLUCAP 96 89 93 142* 289*   Lipid Profile: Recent Labs    06/08/18 0643  CHOL 67   HDL 21*  LDLCALC 33  TRIG 66  CHOLHDL 3.2   Thyroid Function Tests: No results for input(s): TSH, T4TOTAL, FREET4, T3FREE, THYROIDAB in the last 72 hours. Anemia Panel: No results for input(s): VITAMINB12, FOLATE, FERRITIN, TIBC, IRON, RETICCTPCT in the last 72 hours.    Radiology Studies: I have reviewed all of the imaging during this hospital visit personally     Scheduled Meds: . calcitRIOL  2 mcg Oral Q T,Th,Sa-HD  .  calcium acetate  2,001 mg Oral TID WC  . Chlorhexidine Gluconate Cloth  6 each Topical Q0600  . cinacalcet  30 mg Oral Q supper  . [START ON 06/14/2018] darbepoetin (ARANESP) injection - DIALYSIS  100 mcg Intravenous Q Tue-HD  . docusate sodium  100 mg Oral BID  . feeding supplement (NEPRO CARB STEADY)  237 mL Oral BID BM  . heparin  1,800 Units Dialysis Once in dialysis  . multivitamin  1 tablet Oral QHS  . sevelamer carbonate  2.4 g Oral TID WC   Continuous Infusions: . sodium chloride    . sodium chloride    . sodium chloride 10 mL/hr at 06/08/18 1143  . sodium chloride 10 mL/hr at 06/08/18 1647  . methocarbamol (ROBAXIN) IV    . piperacillin-tazobactam (ZOSYN)  IV 3.375 g (06/08/18 2109)     LOS: 5 days        Maliki Gignac Annett Gulaaniel Meelah Tallo, MD [facility] Pager [phone]

## 2018-06-09 NOTE — Progress Notes (Signed)
Echo reviewed - unchanged from January 2019, other than trivial pericardial effusion.  [facility] will sign off.   Medication Recommendations:  No changes Other recommendations (labs, testing, etc):  n/a Follow up as an outpatient:  Please notify us of DC, so we can arrange follow-up  Thurmon FairMihai Razia Screws, MD, [facility] [phone] office [phone] pager

## 2018-06-09 NOTE — Progress Notes (Signed)
Orthopedic Tech Progress Note Patient Details:  Jackie [name] 12/09/1934 [id number]  Patient ID: Jackie [name], female   DOB: 07/09/1935, 83 y.o.   MRN: [id number]   Saul FordyceJennifer C Candiss Galeana 06/09/2018, 9:54 AMCalled [employer] for right stump shrinker and limb guard.

## 2018-06-09 NOTE — Progress Notes (Signed)
Nutrition Follow-up  DOCUMENTATION CODES:   Not applicable  INTERVENTION:   -Continue renal MVI daily -Continue Nepro Shake po BID, each supplement provides 425 kcal and 19 grams protein  NUTRITION DIAGNOSIS:   Increased nutrient needs related to acute illness, chronic illness as evidenced by estimated needs.  Ongoing  GOAL:   Patient will meet greater than or equal to 90% of their needs  Progressing  MONITOR:   PO intake, Supplement acceptance, Labs, Skin  REASON FOR ASSESSMENT:   Malnutrition Screening Tool    ASSESSMENT:   83 yo female, admitted with dry gangrene of R foot. PMH significant for anemia, diabetes melitus, ESRD on HD Tues/Thurs/Sat, L BKA, HLD, HTN.   11/20- s/p PROCEDURE: Transtibial amputation, Application of Prevena wound VAC  Reviewed I/O's: +1.1 L x 24 hours and +2.5 L since admission  Pt out room for procedure at time of visit.   Per chart review, intake has improved; PO: 50-100%. Pt is also taking supplements and vitamins per MAR.  Per CSW, plans to d/c to SNF once medically stable.   Labs reviewed: CBGS: 142-289.   Diet Order:   Diet Order            Diet regular Room service appropriate? Yes; Fluid consistency: Thin  Diet effective now              EDUCATION NEEDS:   No education needs have been identified at this time  Skin:  Skin Assessment: Skin Integrity Issues: Skin Integrity Issues:: Wound VAC Wound Vac: rt BKA site Other: n/a  Last BM:  06/09/18  Height:   Ht Readings from Last 1 Encounters:  06/04/18 5\' 8"  (1.727 m)    Weight:   Wt Readings from Last 1 Encounters:  06/09/18 59 kg    Ideal Body Weight:  55.4 kg(bilateral BKA)  BMI:  Body mass index is 19.78 kg/m.  Estimated Nutritional Needs:   Kcal:  1500-1700  Protein:  75-90 grams  Fluid:  > 1.5 L    Ladislao Cohenour A. Mayford KnifeWilliams, RD, LDN, CDE Pager: [phone] After hours Pager: [phone]

## 2018-06-10 LAB — GLUCOSE, CAPILLARY
GLUCOSE-CAPILLARY: 101 mg/dL — AB (ref 70–99)
GLUCOSE-CAPILLARY: 124 mg/dL — AB (ref 70–99)
GLUCOSE-CAPILLARY: 65 mg/dL — AB (ref 70–99)
GLUCOSE-CAPILLARY: 69 mg/dL — AB (ref 70–99)
GLUCOSE-CAPILLARY: 95 mg/dL (ref 70–99)
Glucose-Capillary: 90 mg/dL (ref 70–99)

## 2018-06-10 LAB — PROTIME-INR
INR: 2.25
Prothrombin Time: 24.6 seconds — ABNORMAL HIGH (ref 11.4–15.2)

## 2018-06-10 MED ORDER — DEXTROSE 50 % IV SOLN
INTRAVENOUS | Status: AC
  Administered 2018-06-11: 25 mL
  Filled 2018-06-10: qty 50

## 2018-06-10 NOTE — Evaluation (Signed)
Occupational Therapy Evaluation Patient Details Name: Jackie [name] L Gaby MRN: [id number] DOB: 08/30/1934 Today's Date: 06/10/2018    History of Present Illness Pt is a 83 y.o. F with significant PMH of previous left transtibial amputation, ESRD, diabetes mellitus, who presents with dry gangrene of right foot. Now s/p right transtibial amputation.   Clinical Impression   Pt lives alone with intermittent assist of her children. She functions frequently from a w/c level, but uses her prosthesis for transfers. She is typically modified independent in self care and light meal prep and housekeeping. Pt presents with generalized weakness, mild R LE pain and is able to sit EOB with B UE support. Pt will need post acute rehab in SNF and is agreeable to this plan. Will follow acutely.    Follow Up Recommendations  SNF    Equipment Recommendations  None recommended by OT    Recommendations for Other Services       Precautions / Restrictions Precautions Precautions: Fall Precaution Comments: Previous L BKA, s/p R BKA Required Braces or Orthoses: Other Brace/Splint Other Brace/Splint: R residual limb protector/shrinker      Mobility Bed Mobility Overal bed mobility: Needs Assistance Bed Mobility: Supine to Sit;Sit to Supine     Supine to sit: Min assist Sit to supine: Supervision   General bed mobility comments: HOB up, use of rail and min assist with bed pad to position hips at EOB, total assist to pull up in bed  Transfers                 General transfer comment: deferred    Balance Overall balance assessment: Needs assistance Sitting-balance support: Bilateral upper extremity supported Sitting balance-Leahy Scale: Fair                                     ADL either performed or assessed with clinical judgement   ADL Overall ADL's : Needs assistance/impaired Eating/Feeding: Independent;Bed level   Grooming: Bed level;Set up(applied lotion to UEs)    Upper Body Bathing: Sitting;Moderate assistance Upper Body Bathing Details (indicate cue type and reason): assisted for back, applied lotion Lower Body Bathing: Sitting/lateral leans;Moderate assistance   Upper Body Dressing : Sitting;Minimal assistance   Lower Body Dressing: Moderate assistance;Sitting/lateral leans       Toileting- Clothing Manipulation and Hygiene: Maximal assistance;Bed level         General ADL Comments: pt familiar with leaning side to side for ADL     Vision Patient Visual Report: No change from baseline       Perception     Praxis      Pertinent Vitals/Pain Pain Assessment: Faces Faces Pain Scale: Hurts a little bit Pain Location: right residual limb with movement Pain Descriptors / Indicators: Grimacing Pain Intervention(s): Monitored during session;Repositioned     Hand Dominance Right   Extremity/Trunk Assessment Upper Extremity Assessment Upper Extremity Assessment: Generalized weakness(R UE with mild edema)   Lower Extremity Assessment Lower Extremity Assessment: Defer to PT evaluation   Cervical / Trunk Assessment Cervical / Trunk Assessment: Kyphotic   Communication Communication Communication: No difficulties   Cognition Arousal/Alertness: Awake/alert Behavior During Therapy: WFL for tasks assessed/performed Overall Cognitive Status: Within Functional Limits for tasks assessed  General Comments       Exercises     Shoulder Instructions      Home Living Family/patient expects to be discharged to:: Skilled nursing facility Living Arrangements: Alone Available Help at Discharge: Family;Available PRN/intermittently Type of Home: Apartment Home Access: Level entry     Home Layout: One level     Bathroom Shower/Tub: Walk-in shower         Home Equipment: Wheelchair - [employer] - 2 wheels;Shower seat          Prior Functioning/Environment Level of  Independence: Independent with assistive device(s)        Comments: Uses prosthetic for ambulation. States she is independent with ADL's        OT Problem List: Decreased strength;Impaired balance (sitting and/or standing);Increased edema;Pain      OT Treatment/Interventions: Self-care/ADL training;DME and/or AE instruction;Therapeutic activities;Patient/family education;Balance training    OT Goals(Current goals can be found in the care plan section) Acute Rehab OT Goals Patient Stated Goal: "go to rehab at [facility]." OT Goal Formulation: With patient Time For Goal Achievement: 06/24/18 Potential to Achieve Goals: Good ADL Goals Pt Will Perform Grooming: with supervision;with set-up;sitting Pt Will Perform Upper Body Dressing: with supervision;with set-up;sitting Pt Will Perform Lower Body Dressing: with min assist;sitting/lateral leans Pt Will Transfer to Toilet: with min assist;with transfer board;bedside commode Pt Will Perform Toileting - Clothing Manipulation and hygiene: with min assist;sitting/lateral leans Pt/caregiver will Perform Home Exercise Program: Increased strength;Both right and left upper extremity;With theraband;With written HEP provided;Independently Additional ADL Goal #1: Pt will demonstrate good sitting balance without use of UEs during ADL.  OT Frequency: Min 2X/week   Barriers to D/C: Decreased caregiver support          Co-evaluation              AM-PAC PT "6 Clicks" Daily Activity     Outcome Measure Help from another person eating meals?: None Help from another person taking care of personal grooming?: A Little Help from another person toileting, which includes using toliet, bedpan, or urinal?: A Lot Help from another person bathing (including washing, rinsing, drying)?: A Lot Help from another person to put on and taking off regular upper body clothing?: A Lot Help from another person to put on and taking off regular lower body clothing?: A  Lot 6 Click Score: 15   End of Session    Activity Tolerance: Patient tolerated treatment well Patient left: in bed;with call bell/phone within reach  OT Visit Diagnosis: Pain;Muscle weakness (generalized) (M62.81)                Time: 4098-1191 OT Time Calculation (min): 33 min Charges:  OT General Charges $OT Visit: 1 Visit OT Evaluation $OT Eval Moderate Complexity: 1 Mod OT Treatments $Self Care/Home Management : 8-22 mins  Martie Round, OTR/L Acute Rehabilitation Services Pager: [phone] Office: [phone]  Evern Bio 06/10/2018, 10:20 AM

## 2018-06-10 NOTE — Progress Notes (Signed)
[facility] Progress Note   Assessment/ Plan:   Dialysis Orders: Center: [facility]   on TTS . EDW 55.5 HD Bath 2k, 2.25 ca  Time 4hr Heparin 1800. Access LUA AVGG     Calcitriol 2 mcg po /HD  Mircera 50 mg q 2 wks (last 05/14/18)  Assessment/Plan 1. RLE gangrene: on antibiotics (Zosyn) per primary, ortho consulted.  S/p BKA 11/20 with Dr Lajoyce Cornersuda, has woundvac. 2. + Pericardial rub, improving- most likely d/t underdialysis- BUN of 42 this AM which isn't terribly high but unmeasured uremic toxins likely contributing.  HD 11/18 11/19, 11/21, next planned for 11/23 and will add 15 min to time.  Cardiology has seen, appreciate assistance.  No other recs than intensified HD reg.  TTE with trivial effusion.  No tamponade 3. ESRD -  HD TTS Schedule, Missed hd 11/16.  Dialyzing more intensively for + pericarditis 4. Possible seizure activity: none seen here, syncopal episodes noted as OP, will monitor, consider neuro c/s for EEG vs referral for eval 5. Atrial Fibrillation - inr 2.87.  Coumadin as OP 6. Hypertension/volume  - bp and vol stable, no op bp meds  7. Anemia  - hgb 9.6  ESA due 11/26 8. Metabolic bone disease - ca corec 10.3  On sensipar 30 mg q d, phoslo 3 ac and Renvela listed as binders .po vit d on hd.  Will switch no non-Ca based binder 9. DM type 2 - per admit  10. Nutrition - alb 2.4 , renal carb mod diet ,nepro  Supplement, renal vit 11. Dispo: pending  Subjective:    Sitting up in bed eating breakfast.  Per OP HD center, there was some concern pt was having seizures on HD and at home-- ie syncopal episodes.  Per RN staff here, no staring spells/ seizure activity has been described.     Objective:   BP (!) 113/32 (BP Location: Right Wrist)   Pulse (!) 111   Temp 97.7 F (36.5 C) (Oral)   Resp 18   Ht 5\' 8"  (1.727 m)   Wt 58 kg   SpO2 93%   BMI 19.44 kg/m   Physical Exam: Gen: elderly woman, sitting in bed, eating CVS: irregular, pericardial rub much improved  this AM.  III/VI high-pitched tight systolic murmur also heard Resp: clear bilaterally WUJ:[id number]:nondistended nontender Ext: RLE s/p BKA and woundvac, some RUE edema ACCESS: LUE AVG + T/B  Labs: BMET Recent Labs  Lab 06/04/18 1055 06/05/18 0258 06/06/18 0222 06/07/18 0518 06/08/18 1125 06/08/18 1204 06/08/18 1633 06/09/18 0855  NA 138 138 134* 136 133* 140 138 136  K 3.2* 3.3* 3.9 3.7 6.7* 4.3 4.5 4.5  CL 99 100 97* 98  --   --  104 103  CO2 26 23 24 28   --   --  25 22  GLUCOSE 139* 132* 161* 35* 78 83 159* 186*  BUN 28* 37* 42* 21  --   --  18 24*  CREATININE 5.51* 6.51* 7.55* 4.68*  --   --  4.26* 5.30*  CALCIUM 9.1 9.0 9.0 8.4*  --   --  8.3* 8.6*  PHOS  --   --  5.3*  --   --   --   --   --    CBC Recent Labs  Lab 06/04/18 1055  06/06/18 0222 06/07/18 0506 06/08/18 0643 06/08/18 1125 06/08/18 1204 06/09/18 0855  WBC 8.5   < > 9.4 10.7* 10.6*  --   --  8.0  NEUTROABS 7.3  --  7.9*  --   --   --   --  6.9  HGB 9.6*   < > 9.1* 9.2* 8.8* 9.9* 10.2* 8.6*  HCT 30.2*   < > 29.7* 30.2* 28.2* 29.0* 30.0* 28.9*  MCV 89.1   < > 88.9 89.1 90.1  --   --  91.7  PLT 139*   < > 132* 136* 116*  --   --  151   < > = values in this interval not displayed.    @IMGRELPRIORS @ Medications:    . calcitRIOL  2 mcg Oral Q T,Th,Sa-HD  . calcium acetate  2,001 mg Oral TID WC  . Chlorhexidine Gluconate Cloth  6 each Topical Q0600  . cinacalcet  30 mg Oral Q supper  . [START ON 06/14/2018] darbepoetin (ARANESP) injection - DIALYSIS  100 mcg Intravenous Q Tue-HD  . docusate sodium  100 mg Oral BID  . feeding supplement (NEPRO CARB STEADY)  237 mL Oral BID BM  . heparin  1,800 Units Dialysis Once in dialysis  . multivitamin  1 tablet Oral QHS  . sevelamer carbonate  2.4 g Oral TID WC     Bufford Buttner, MD [facility] pgr [phone] 06/10/2018, 10:17 AM

## 2018-06-10 NOTE — Progress Notes (Signed)
Patient ID: Jackie [name], female   DOB: 07/04/1935, 83 y.o.   MRN: [id number] POD #2 S/P right transtibial amputation. Patient reports no problems this am. Pain is under control. No drainage noted in VAC canister.  Has [employer] stump shrinker and stump protector in place this am.   H. J. HeinzAYBURN,SHAWN,PA-C [facility] [phone]

## 2018-06-10 NOTE — Clinical Social Work Placement (Signed)
   CLINICAL SOCIAL WORK PLACEMENT  NOTE  Date:  06/10/2018  Patient Details  Name: Jackie [name] MRN: [id number] Date of Birth: 02/24/1935  Clinical Social Work is seeking post-discharge placement for this patient at the Skilled  Nursing Facility level of care (*CSW will initial, date and re-position this form in  chart as items are completed):  Yes   Patient/family provided with [facility] Clinical Social Work Department's list of facilities offering this level of care within the geographic area requested by the patient (or if unable, by the patient's family).  Yes   Patient/family informed of their freedom to choose among providers that offer the needed level of care, that participate in Medicare, Medicaid or managed care program needed by the patient, have an available bed and are willing to accept the patient.  Yes   Patient/family informed of [facility]'s ownership interest in [facility] and [facility], as well as of the fact that they are under no obligation to receive care at these facilities.  PASRR submitted to EDS on 06/10/18     PASRR number received on       Existing PASRR number confirmed on 06/10/18     FL2 transmitted to all facilities in geographic area requested by pt/family on 06/10/18     FL2 transmitted to all facilities within larger geographic area on       Patient informed that his/her managed care company has contracts with or will negotiate with certain facilities, including the following:            Patient/family informed of bed offers received.  Patient chooses bed at       Physician recommends and patient chooses bed at      Patient to be transferred to   on  .  Patient to be transferred to facility by       Patient family notified on   of transfer.  Name of family member notified:        PHYSICIAN Please sign FL2     Additional Comment:    _______________________________________________ Margarito LinerSarah C Dae Highley, LCSW 06/10/2018,  12:06 PM

## 2018-06-10 NOTE — NC FL2 (Signed)
[place] MEDICAID FL2 LEVEL OF CARE SCREENING TOOL     IDENTIFICATION  Patient Name: Jackie [name] Birthdate: 1934/11/30 Sex: female Admission Date (Current Location): 06/04/2018  [facility] and [place] Number:  Producer, television/film/video and Address:  [facility] [place]. [facility], [address], [place], [place] [address]      Provider Number: [id number]  Attending Physician Name and Address:  Coralie Keens  Relative Name and Phone Number:       Current Level of Care: Hospital Recommended Level of Care: Skilled Nursing Facility Prior Approval Number:    Date Approved/Denied:   PASRR Number: [id number]  Discharge Plan: SNF    Current Diagnoses: Patient Active Problem List   Diagnosis Date Noted  . Gangrene (HCC) 06/04/2018  . Syncope and collapse 04/26/2018  . Long term (current) use of anticoagulants [Z79.01] 07/30/2017  . Atrial fibrillation (HCC) 07/23/2017  . Medication management 07/23/2017  . Essential hypertension 07/23/2017  . Mixed hyperlipidemia 07/23/2017  . PAD (peripheral artery disease) (HCC) 07/23/2017  . S/P unilateral BKA (below knee amputation) (HCC) 04/24/2015  . Right foot ulcer, with necrosis of muscle (HCC) 12/04/2014  . Leukocytosis, unspecified 04/22/2013  . HTN (hypertension), benign 04/18/2013  . Diabetes mellitus (HCC) 04/18/2013  . Syncope 04/18/2013  . Anemia 04/18/2013  . ESRF (end stage renal failure) (HCC) 09/02/2011    Orientation RESPIRATION BLADDER Height & Weight     Self, Time, Situation, Place  O2(Nasal Canula 3 L.) Incontinent Weight: 127 lb 13.9 oz (58 kg) Height:  5\' 8"  (172.7 cm)  BEHAVIORAL SYMPTOMS/MOOD NEUROLOGICAL BOWEL NUTRITION STATUS  (None) (None) Incontinent Diet(Regular)  AMBULATORY STATUS COMMUNICATION OF NEEDS Skin   Extensive Assist Verbally Wound Vac, Other (Comment), Surgical wounds(Prevena wound vac. Cracking.)                       Personal Care Assistance Level of Assistance   Bathing, Feeding, Dressing Bathing Assistance: Limited assistance Feeding assistance: Independent Dressing Assistance: Limited assistance     Functional Limitations Info  Sight, Hearing, Speech Sight Info: Adequate Hearing Info: Adequate Speech Info: Adequate    SPECIAL CARE FACTORS FREQUENCY  PT (By licensed PT), Blood pressure, OT (By licensed OT)     PT Frequency: 5 x week OT Frequency: 5 x week            Contractures Contractures Info: Not present    Additional Factors Info  Code Status, Allergies Code Status Info: Full code Allergies Info: NKDA           Current Medications (06/10/2018):  This is the current hospital active medication list Current Facility-Administered Medications  Medication Dose Route Frequency Provider Last Rate Last Dose  . 0.9 %  sodium chloride infusion  100 mL Intravenous PRN Nadara Mustard, MD      . 0.9 %  sodium chloride infusion  100 mL Intravenous PRN Nadara Mustard, MD      . 0.9 %  sodium chloride infusion   Intravenous Continuous Nadara Mustard, MD 10 mL/hr at 06/08/18 1143    . 0.9 %  sodium chloride infusion   Intravenous Continuous Nadara Mustard, MD 10 mL/hr at 06/08/18 1647    . acetaminophen (TYLENOL) tablet 650 mg  650 mg Oral Q6H PRN Nadara Mustard, MD      . bisacodyl (DULCOLAX) suppository 10 mg  10 mg Rectal Daily PRN Nadara Mustard, MD      . calcitRIOL (  ROCALTROL) capsule 2 mcg  2 mcg Oral Q T,Th,Sa-HD Nadara Mustard, MD   2 mcg at 06/09/18 1712  . calcium acetate (PHOSLO) capsule 2,001 mg  2,001 mg Oral TID WC Nadara Mustard, MD   2,001 mg at 06/10/18 1610  . Chlorhexidine Gluconate Cloth 2 % PADS 6 each  6 each Topical Q0600 Nadara Mustard, MD   6 each at 06/08/18 0510  . cinacalcet (SENSIPAR) tablet 30 mg  30 mg Oral Q supper Nadara Mustard, MD   30 mg at 06/09/18 1909  . [START ON 06/14/2018] Darbepoetin Alfa (ARANESP) injection 100 mcg  100 mcg Intravenous Q Tue-HD Nadara Mustard, MD      . docusate sodium  (COLACE) capsule 100 mg  100 mg Oral BID Nadara Mustard, MD   100 mg at 06/10/18 9604  . feeding supplement (NEPRO CARB STEADY) liquid 237 mL  237 mL Oral BID BM Nadara Mustard, MD   237 mL at 06/10/18 0929  . heparin injection 1,800 Units  1,800 Units Dialysis Once in dialysis Nadara Mustard, MD      . HYDROmorphone (DILAUDID) injection 0.5-1 mg  0.5-1 mg Intravenous Q4H PRN Nadara Mustard, MD      . lidocaine (PF) (XYLOCAINE) 1 % injection 5 mL  5 mL Intradermal PRN Nadara Mustard, MD      . lidocaine-prilocaine (EMLA) cream 1 application  1 application Topical PRN Nadara Mustard, MD      . magnesium citrate solution 1 Bottle  1 Bottle Oral Once PRN Nadara Mustard, MD      . methocarbamol (ROBAXIN) tablet 500 mg  500 mg Oral Q6H PRN Nadara Mustard, MD       Or  . methocarbamol (ROBAXIN) 500 mg in dextrose 5 % 50 mL IVPB  500 mg Intravenous Q6H PRN Nadara Mustard, MD      . metoCLOPramide (REGLAN) tablet 5-10 mg  5-10 mg Oral Q8H PRN Nadara Mustard, MD       Or  . metoCLOPramide (REGLAN) injection 5-10 mg  5-10 mg Intravenous Q8H PRN Nadara Mustard, MD      . multivitamin (RENA-VIT) tablet 1 tablet  1 tablet Oral QHS Nadara Mustard, MD   1 tablet at 06/09/18 2150  . ondansetron (ZOFRAN) tablet 4 mg  4 mg Oral Q6H PRN Nadara Mustard, MD       Or  . ondansetron [facility]) injection 4 mg  4 mg Intravenous Q6H PRN Nadara Mustard, MD      . oxyCODONE (Oxy IR/ROXICODONE) immediate release tablet 10-15 mg  10-15 mg Oral Q4H PRN Nadara Mustard, MD   10 mg at 06/09/18 1715  . oxyCODONE (Oxy IR/ROXICODONE) immediate release tablet 5-10 mg  5-10 mg Oral Q4H PRN Nadara Mustard, MD   10 mg at 06/10/18 1129  . pentafluoroprop-tetrafluoroeth (GEBAUERS) aerosol 1 application  1 application Topical PRN Nadara Mustard, MD      . polyethylene glycol (MIRALAX / GLYCOLAX) packet 17 g  17 g Oral Daily PRN Nadara Mustard, MD      . polyethylene glycol (MIRALAX / GLYCOLAX) packet 17 g  17 g Oral Daily PRN Nadara Mustard, MD      . sevelamer carbonate (RENVELA) powder PACK 2.4 g  2.4 g Oral TID WC Nadara Mustard, MD   2.4 g at 06/10/18 5409     Discharge Medications: Please see  discharge summary for a list of discharge medications.  Relevant Imaging Results:  Relevant Lab Results:   Additional Information SS#: [id number]. [facility]. Chair time is 12:30. She needs to be in the lobby by 12:00.  Margarito LinerSarah C Ousmane Seeman, LCSW

## 2018-06-10 NOTE — Clinical Social Work Note (Addendum)
[facility]ie [name] is able to offer patient a bed. Discussed with MD. Plan for discharge tomorrow after HD.  Charlynn CourtSarah Marta Bouie, CSW [phone]  1:22 pm Notified patient of plan for tomorrow. She called her son, Jesusita OkaDan, and made him aware as well.  Charlynn CourtSarah Tessie Ordaz, CSW [phone]

## 2018-06-10 NOTE — Progress Notes (Signed)
PROGRESS NOTE    Jackie [name]  ZOX:[id number] DOB: 01/21/1935 DOA: 06/04/2018 PCP: Mirna MiresHill, Gerald, MD    Brief Narrative:  [age] year old female who presented after a mechanical fall. Reported right lower extremity pain. In the emergency department her right foot was noted to have dry gangrene. The history was limited. On the initial physical examination blood pressure 137/84, heart rate 121, oxygen saturation 98%, moist mucous membranes, lungs clear to auscultation bilaterally, heart S1-S2 present rhythmic, abdomen soft nontender, lower extremities with dry gangrene on the right, up to the mid calf, foul-smelling, no palpable pulses, positive left BKA.  The patient patient was admitted to the hospital working diagnosis of right extremity chronic gangrene   Assessment & Plan:   Principal Problem:   Gangrene (HCC) Active Problems:   ESRF (end stage renal failure) (HCC)   HTN (hypertension), benign   Diabetes mellitus (HCC)   Right foot ulcer, with necrosis of muscle (HCC)   Atrial fibrillation (HCC)   Essential hypertension   Mixed hyperlipidemia   PAD (peripheral artery disease) (HCC)   Long term (current) use of anticoagulants [Z79.01]   1. Right foot gangrene. (presnet on admission) in the setting of peripheral vascular disease.sp right below the knee amputation. Pain continue well controlled. Discontinue IV antibiotics, will continue to follow with post op surgical recommendations.  2. Chronica atrial fibrillation. Continue warfarin for anticoagulation, rate has remained well controlled, will dc telemetry for now.  3. T2DM with hypoglycemic episodes. On insulin sliding for glucose cover and monitoring.   4. 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 Continue wth sensipar, calcitriol and phoslo. Plan for HD in am, per nephrology recommendations. Continue aranesp.   5. HTN. Continue with blood pressure monitoring   6. Thrombocytopenia.  Multifactorial. Has remained stable.    7.Protein calorie malnutrition, unspecified. Continue nutritional supplements, tolerating po well.   DVT prophylaxis:heparin IV Code Status:full Family Communication:no family at the bedside Disposition Plan/ discharge barriers:pending post op recommendations.  Body mass index is 19.44 kg/m. Malnutrition Type:  Nutrition Problem: Increased nutrient needs Etiology: acute illness, chronic illness   Malnutrition Characteristics:  Signs/Symptoms: estimated needs   Nutrition Interventions:  Interventions: Nepro shake  RN Pressure Injury Documentation:     Consultants:   Nephrology   Orthopedics   Procedures:   Right bka  Antimicrobials:       Subjective: Patient is feeling better, continue to be very weak and deconditioned, right lower extremity pain is controlled, no nausea or vomiting, no dyspnea or chest pain.   Objective: Vitals:   06/09/18 1842 06/09/18 2033 06/09/18 2342 06/10/18 0326  BP:   (!) 103/36 (!) 102/36  Pulse: (!) 106 (!) 108 (!) 109 (!) 112  Resp: 16 20 12 15   Temp: 97.9 F (36.6 C) 98 F (36.7 C) 98 F (36.7 C) 97.6 F (36.4 C)  TempSrc: Oral Oral Oral Oral  SpO2: 96% 97% 100% 100%  Weight:      Height:        Intake/Output Summary (Last 24 hours) at 06/10/2018 0742 Last data filed at 06/10/2018 0300 Gross per 24 hour  Intake 610.67 ml  Output 727 ml  Net -116.33 ml   Filed Weights   06/07/18 1712 06/09/18 1335 06/09/18 1743  Weight: 56.5 kg 59 kg 58 kg    Examination:   General: deconditioned  Neurology: Awake and alert, non focal  E ENT: mild pallor, no icterus, oral mucosa moist Cardiovascular: No JVD. S1-S2 present,  rhythmic, no gallops, or murmurs. Positive rub. No lower extremity edema. Pulmonary: positive breath sounds bilaterally, adequate air movement, no wheezing, rhonchi or rales. Gastrointestinal. Abdomen flat, no organomegaly, non tender, no rebound or  guarding Skin. No rashes Musculoskeletal: bilateral BKA.      Data Reviewed: I have personally reviewed following labs and imaging studies  CBC: Recent Labs  Lab 06/04/18 1055 06/05/18 0258 06/06/18 0222 06/07/18 0506 06/08/18 0643 06/08/18 1125 06/08/18 1204 06/09/18 0855  WBC 8.5 8.3 9.4 10.7* 10.6*  --   --  8.0  NEUTROABS 7.3  --  7.9*  --   --   --   --  6.9  HGB 9.6* 8.8* 9.1* 9.2* 8.8* 9.9* 10.2* 8.6*  HCT 30.2* 28.0* 29.7* 30.2* 28.2* 29.0* 30.0* 28.9*  MCV 89.1 88.1 88.9 89.1 90.1  --   --  91.7  PLT 139* 137* 132* 136* 116*  --   --  151   Basic Metabolic Panel: Recent Labs  Lab 06/05/18 0258 06/06/18 0222 06/07/18 0518 06/08/18 1125 06/08/18 1204 06/08/18 1633 06/09/18 0855  NA 138 134* 136 133* 140 138 136  K 3.3* 3.9 3.7 6.7* 4.3 4.5 4.5  CL 100 97* 98  --   --  104 103  CO2 23 24 28   --   --  25 22  GLUCOSE 132* 161* 35* 78 83 159* 186*  BUN 37* 42* 21  --   --  18 24*  CREATININE 6.51* 7.55* 4.68*  --   --  4.26* 5.30*  CALCIUM 9.0 9.0 8.4*  --   --  8.3* 8.6*  MG  --  2.2  --   --   --   --   --   PHOS  --  5.3*  --   --   --   --   --    GFR: Estimated Creatinine Clearance: 7.6 mL/min (A) (by C-G formula based on SCr of 5.3 mg/dL (H)). Liver Function Tests: Recent Labs  Lab 06/04/18 1055 06/06/18 0222  AST 28  --   ALT 17  --   ALKPHOS 70  --   BILITOT 1.3*  --   PROT 6.6  --   ALBUMIN 2.4* 2.1*   No results for input(s): LIPASE, AMYLASE in the last 168 hours. No results for input(s): AMMONIA in the last 168 hours. Coagulation Profile: Recent Labs  Lab 06/05/18 1450 06/07/18 0506 06/08/18 0643 06/09/18 0855 06/10/18 0304  INR 2.33 1.92 1.88 1.85 2.25   Cardiac Enzymes: No results for input(s): CKTOTAL, CKMB, CKMBINDEX, TROPONINI in the last 168 hours. BNP (last 3 results) No results for input(s): PROBNP in the last 8760 hours. HbA1C: No results for input(s): HGBA1C in the last 72 hours. CBG: Recent Labs  Lab  06/08/18 2115 06/09/18 0810 06/09/18 1210 06/09/18 2110 06/10/18 0446  GLUCAP 289* 182* 142* 114* 95   Lipid Profile: Recent Labs    06/08/18 0643  CHOL 67  HDL 21*  LDLCALC 33  TRIG 66  CHOLHDL 3.2   Thyroid Function Tests: No results for input(s): TSH, T4TOTAL, FREET4, T3FREE, THYROIDAB in the last 72 hours. Anemia Panel: No results for input(s): VITAMINB12, FOLATE, FERRITIN, TIBC, IRON, RETICCTPCT in the last 72 hours.    Radiology Studies: I have reviewed all of the imaging during this hospital visit personally     Scheduled Meds: . calcitRIOL  2 mcg Oral Q T,Th,Sa-HD  . calcium acetate  2,001 mg Oral TID WC  . Chlorhexidine  Gluconate Cloth  6 each Topical [id number]  . cinacalcet  30 mg Oral Q supper  . [START ON 06/14/2018] darbepoetin (ARANESP) injection - DIALYSIS  100 mcg Intravenous Q Tue-HD  . docusate sodium  100 mg Oral BID  . feeding supplement (NEPRO CARB STEADY)  237 mL Oral BID BM  . heparin  1,800 Units Dialysis Once in dialysis  . multivitamin  1 tablet Oral QHS  . sevelamer carbonate  2.4 g Oral TID WC   Continuous Infusions: . sodium chloride    . sodium chloride    . sodium chloride 10 mL/hr at 06/08/18 1143  . sodium chloride 10 mL/hr at 06/08/18 1647  . methocarbamol (ROBAXIN) IV    . piperacillin-tazobactam (ZOSYN)  IV 3.375 g (06/09/18 1008)     LOS: 6 days        Ludmila Ebarb Annett Gula, MD [facility] Pager [phone]

## 2018-06-10 NOTE — Clinical Social Work Note (Signed)
Clinical Social Work Assessment  Patient Details  Name: Jackie [name] MRN: [id number] Date of Birth: [birth year]  Date of referral:  06/10/18               Reason for consult:  Facility Placement, Discharge Planning                Permission sought to share information with:  Facility Art therapist granted to share information::     Name::        Agency::  SNF's  Relationship::     Contact Information:     Housing/Transportation Living arrangements for the past 2 months:  Apartment Source of Information:  Patient, Medical Team Patient Interpreter Needed:  None Criminal Activity/Legal Involvement Pertinent to Current Situation/Hospitalization:  No - Comment as needed Significant Relationships:  Adult Children Lives with:  Self Do you feel safe going back to the place where you live?  Yes Need for family participation in patient care:  Yes (Comment)  Care giving concerns:  PT recommending SNF once medically stable for discharge.   Social Worker assessment / plan:  CSW met with patient. No supports at bedside. CSW introduced role and explained that PT recommendations would be discussed. Patient is agreeable to SNF placement. [facility] is first preference. Admissions coordinator aware. They are seeing if they can transport her to HD. No further concerns. CSW encouraged patient to contact CSW as needed. CSW will continue to follow patient for support and facilitate discharge to SNF once medically stable.  Employment status:  Retired Forensic scientist:  Medicare PT Recommendations:  [place] / Referral to community resources:  [place]  Patient/Family's Response to care:  Patient agreeable to SNF placement. Patient's sons supportive and involved in patient's care. Patient appreciated social work intervention.  Patient/Family's Understanding of and Emotional Response to Diagnosis, Current Treatment, and Prognosis:   Patient has a good understanding of the reason for admission and her need for rehab prior to returning home. Patient appears happy with hospital care.  Emotional Assessment Appearance:  Appears stated age Attitude/Demeanor/Rapport:  Engaged, Gracious Affect (typically observed):  Accepting, Appropriate, Calm, Pleasant Orientation:  Oriented to Self, Oriented to Place, Oriented to  Time, Oriented to Situation Alcohol / Substance use:  Never Used Psych involvement (Current and /or in the community):  No (Comment)  Discharge Needs  Concerns to be addressed:  Care Coordination Readmission within the last 30 days:  No Current discharge risk:  Dependent with Mobility, Lives alone Barriers to Discharge:  Continued Medical Work up   Candie Chroman, LCSW 06/10/2018, 12:03 PM

## 2018-06-11 DIAGNOSIS — T82868A Thrombosis of vascular prosthetic devices, implants and grafts, initial encounter: Secondary | ICD-10-CM | POA: Diagnosis not present

## 2018-06-11 DIAGNOSIS — R1311 Dysphagia, oral phase: Secondary | ICD-10-CM | POA: Diagnosis not present

## 2018-06-11 DIAGNOSIS — Z794 Long term (current) use of insulin: Secondary | ICD-10-CM | POA: Diagnosis not present

## 2018-06-11 DIAGNOSIS — E161 Other hypoglycemia: Secondary | ICD-10-CM | POA: Diagnosis not present

## 2018-06-11 DIAGNOSIS — N2581 Secondary hyperparathyroidism of renal origin: Secondary | ICD-10-CM | POA: Diagnosis not present

## 2018-06-11 DIAGNOSIS — R4020 Unspecified coma: Secondary | ICD-10-CM | POA: Diagnosis not present

## 2018-06-11 DIAGNOSIS — R2689 Other abnormalities of gait and mobility: Secondary | ICD-10-CM | POA: Diagnosis not present

## 2018-06-11 DIAGNOSIS — Z4781 Encounter for orthopedic aftercare following surgical amputation: Secondary | ICD-10-CM | POA: Diagnosis not present

## 2018-06-11 DIAGNOSIS — E1129 Type 2 diabetes mellitus with other diabetic kidney complication: Secondary | ICD-10-CM | POA: Diagnosis not present

## 2018-06-11 DIAGNOSIS — R404 Transient alteration of awareness: Secondary | ICD-10-CM | POA: Diagnosis not present

## 2018-06-11 DIAGNOSIS — N186 End stage renal disease: Secondary | ICD-10-CM | POA: Diagnosis not present

## 2018-06-11 DIAGNOSIS — I739 Peripheral vascular disease, unspecified: Secondary | ICD-10-CM | POA: Diagnosis not present

## 2018-06-11 DIAGNOSIS — E162 Hypoglycemia, unspecified: Secondary | ICD-10-CM | POA: Diagnosis not present

## 2018-06-11 DIAGNOSIS — E1122 Type 2 diabetes mellitus with diabetic chronic kidney disease: Secondary | ICD-10-CM | POA: Diagnosis not present

## 2018-06-11 DIAGNOSIS — R41841 Cognitive communication deficit: Secondary | ICD-10-CM | POA: Diagnosis not present

## 2018-06-11 DIAGNOSIS — I96 Gangrene, not elsewhere classified: Secondary | ICD-10-CM | POA: Diagnosis not present

## 2018-06-11 DIAGNOSIS — R29898 Other symptoms and signs involving the musculoskeletal system: Secondary | ICD-10-CM | POA: Diagnosis not present

## 2018-06-11 DIAGNOSIS — M79661 Pain in right lower leg: Secondary | ICD-10-CM | POA: Diagnosis not present

## 2018-06-11 DIAGNOSIS — R498 Other voice and resonance disorders: Secondary | ICD-10-CM | POA: Diagnosis not present

## 2018-06-11 DIAGNOSIS — E119 Type 2 diabetes mellitus without complications: Secondary | ICD-10-CM | POA: Diagnosis not present

## 2018-06-11 DIAGNOSIS — R5381 Other malaise: Secondary | ICD-10-CM | POA: Diagnosis not present

## 2018-06-11 DIAGNOSIS — I4891 Unspecified atrial fibrillation: Secondary | ICD-10-CM | POA: Diagnosis not present

## 2018-06-11 DIAGNOSIS — Z9181 History of falling: Secondary | ICD-10-CM | POA: Diagnosis not present

## 2018-06-11 DIAGNOSIS — Z23 Encounter for immunization: Secondary | ICD-10-CM | POA: Diagnosis not present

## 2018-06-11 DIAGNOSIS — I120 Hypertensive chronic kidney disease with stage 5 chronic kidney disease or end stage renal disease: Secondary | ICD-10-CM | POA: Diagnosis not present

## 2018-06-11 DIAGNOSIS — M6281 Muscle weakness (generalized): Secondary | ICD-10-CM | POA: Diagnosis not present

## 2018-06-11 DIAGNOSIS — Z992 Dependence on renal dialysis: Secondary | ICD-10-CM | POA: Diagnosis not present

## 2018-06-11 DIAGNOSIS — M2550 Pain in unspecified joint: Secondary | ICD-10-CM | POA: Diagnosis not present

## 2018-06-11 DIAGNOSIS — D509 Iron deficiency anemia, unspecified: Secondary | ICD-10-CM | POA: Diagnosis not present

## 2018-06-11 DIAGNOSIS — N184 Chronic kidney disease, stage 4 (severe): Secondary | ICD-10-CM | POA: Diagnosis not present

## 2018-06-11 DIAGNOSIS — Z89511 Acquired absence of right leg below knee: Secondary | ICD-10-CM | POA: Diagnosis not present

## 2018-06-11 DIAGNOSIS — Z7401 Bed confinement status: Secondary | ICD-10-CM | POA: Diagnosis not present

## 2018-06-11 DIAGNOSIS — I10 Essential (primary) hypertension: Secondary | ICD-10-CM | POA: Diagnosis not present

## 2018-06-11 DIAGNOSIS — I4811 Longstanding persistent atrial fibrillation: Secondary | ICD-10-CM | POA: Diagnosis not present

## 2018-06-11 DIAGNOSIS — I959 Hypotension, unspecified: Secondary | ICD-10-CM | POA: Diagnosis not present

## 2018-06-11 DIAGNOSIS — D631 Anemia in chronic kidney disease: Secondary | ICD-10-CM | POA: Diagnosis not present

## 2018-06-11 DIAGNOSIS — L97513 Non-pressure chronic ulcer of other part of right foot with necrosis of muscle: Secondary | ICD-10-CM | POA: Diagnosis not present

## 2018-06-11 DIAGNOSIS — E46 Unspecified protein-calorie malnutrition: Secondary | ICD-10-CM | POA: Diagnosis not present

## 2018-06-11 DIAGNOSIS — I871 Compression of vein: Secondary | ICD-10-CM | POA: Diagnosis not present

## 2018-06-11 LAB — CBC
HCT: 33 % — ABNORMAL LOW (ref 36.0–46.0)
HEMOGLOBIN: 10.4 g/dL — AB (ref 12.0–15.0)
MCH: 28.9 pg (ref 26.0–34.0)
MCHC: 31.5 g/dL (ref 30.0–36.0)
MCV: 91.7 fL (ref 80.0–100.0)
NRBC: 6.9 % — AB (ref 0.0–0.2)
Platelets: 144 10*3/uL — ABNORMAL LOW (ref 150–400)
RBC: 3.6 MIL/uL — AB (ref 3.87–5.11)
RDW: 18.4 % — ABNORMAL HIGH (ref 11.5–15.5)
WBC: 8.6 10*3/uL (ref 4.0–10.5)

## 2018-06-11 LAB — GLUCOSE, CAPILLARY
GLUCOSE-CAPILLARY: 106 mg/dL — AB (ref 70–99)
Glucose-Capillary: 75 mg/dL (ref 70–99)
Glucose-Capillary: 85 mg/dL (ref 70–99)

## 2018-06-11 LAB — BASIC METABOLIC PANEL
Anion gap: 12 (ref 5–15)
BUN: 25 mg/dL — ABNORMAL HIGH (ref 8–23)
CHLORIDE: 101 mmol/L (ref 98–111)
CO2: 22 mmol/L (ref 22–32)
CREATININE: 5.45 mg/dL — AB (ref 0.44–1.00)
Calcium: 9.4 mg/dL (ref 8.9–10.3)
GFR calc non Af Amer: 7 mL/min — ABNORMAL LOW (ref >60)
GFR, EST AFRICAN AMERICAN: 8 mL/min — AB (ref >60)
Glucose, Bld: 111 mg/dL — ABNORMAL HIGH (ref 70–99)
POTASSIUM: 4.5 mmol/L (ref 3.5–5.1)
Sodium: 135 mmol/L (ref 135–145)

## 2018-06-11 LAB — PROTIME-INR
INR: 2.53
Prothrombin Time: 26.9 seconds — ABNORMAL HIGH (ref 11.4–15.2)

## 2018-06-11 MED ORDER — CALCITRIOL 0.5 MCG PO CAPS
ORAL_CAPSULE | ORAL | Status: AC
  Filled 2018-06-11: qty 4

## 2018-06-11 MED ORDER — ATORVASTATIN CALCIUM 40 MG PO TABS: 40.0000 mg | ORAL_TABLET | Freq: Every day | ORAL | 0 refills | Status: AC

## 2018-06-11 MED ORDER — ATORVASTATIN CALCIUM 10 MG PO TABS: 40.0000 mg | ORAL_TABLET | Freq: Every day | ORAL | Status: DC

## 2018-06-11 MED ORDER — OXYCODONE HCL 5 MG PO TABS: 5.0000 mg | ORAL_TABLET | Freq: Four times a day (QID) | ORAL | 0 refills | Status: AC | PRN

## 2018-06-11 MED ORDER — NEPRO/CARBSTEADY PO LIQD: 237.0000 mL | Freq: Two times a day (BID) | ORAL | 0 refills | Status: AC

## 2018-06-11 MED ORDER — OXYCODONE HCL 5 MG PO TABS: 5.0000 mg | ORAL_TABLET | Freq: Four times a day (QID) | ORAL | 0 refills | Status: DC | PRN

## 2018-06-11 MED ORDER — ACETAMINOPHEN 325 MG PO TABS: 650.0000 mg | ORAL_TABLET | Freq: Four times a day (QID) | ORAL | 0 refills | Status: AC | PRN

## 2018-06-11 NOTE — Discharge Summary (Signed)
Physician Discharge Summary  Jackie [name] ZOX:[id number] DOB: 1934-08-03 DOA: 06/04/2018  PCP: Mirna Mires, MD  Admit date: 06/04/2018 Discharge date: 06/18/2018  Admitted From: Home  Disposition:  SNF   Recommendations for Outpatient Follow-up and new medication changes:  1. Follow up with DR. Hill in 7 days 2. Please obtain INR on 06/13/2018, target INR 2-3.  3. Started on atorvastatin. 4. Discharged with wound vac to her right BKA 5. Follow with orthopedics, Dr. Lajoyce Corners as scheduled.   Home Health: na   Equipment/Devices: na    Discharge Condition: stable  CODE STATUS: full  Diet recommendation: Renal dialysis and diabetic prudent.   Brief/Interim Summary: 83 year old female who presented after a mechanical fall. Reported right lower extremity pain. In the emergency department her right foot was noted to have dry gangrene. The history was limited. On the initial physical examination blood pressure 137/84, heart rate 121, oxygen saturation 98%, moist mucous membranes, lungs clear to auscultation bilaterally, heart S1-S2 present rhythmic, positive rub, abdomen soft nontender, lower extremities with dry gangrene on the right, up to the mid calf, foul-smelling, no palpable pulses, positive left BKA. Sodium 138, potassium 3.2, chloride 99, bicarb 26, glucose 139, BUN 28, creatinine 5.5, white count 8.5, hemoglobin 9.6, hematocrit 30.2, platelets 139, INR 3.0.  CT angiography, right lower extremity outflow disease with multifocal stenosis in the right SFA.  At least 2 critical areas of stenosis in the right SFA.  Left outflow disease with occlusion of the left mid and distal SFA and popliteal artery.  Aortic and iliac arteries are heavily calcified without significant inflow stenosis.  Stenosis in the right renal artery.  Positive cholelithiasis.  Chest radiograph with cardiomegaly, and large vascular hilar regions.  EKG atrial fibrillation, 66 bpm, rightward axis, poor R wave progression,  low voltage.  The patient patient was admitted to the hospital with the working diagnosis of right extremity chronic gangrene, complicated with uremic pericarditis.    1.  Right foot gangrene (present on admission) in the setting of severe peripheral vascular disease, status post right below-the-knee amputation.  Patient was admitted to the telemetry unit, she received analgesics and IV antibiotic therapy with Zosyn.  Orthopedic evaluation recommended right below the knee amputation. A wound VAC was placed post operative and it will be continued at discharge.  Due to severe peripheral vascular disease patient was started on statin.  2.  End-stage renal disease on hemodialysis, complicated with uremic pericarditis.  Patient remained hemodynamically stable, further work-up with echocardiography, showed LV systolic function 55 to 60%, trivial pericardial effusion was identified. She underwent intense hemodialysis during her hospitalization.  Patient will continue scheduled Tuesday, Thursday, Saturday hemodialysis at discharge.  For metabolic bone disease continue calcitriol, PhosLo, Sensipar, and Sevelamer. For her anemia of chronic kidney disease continue darbepoetin.  3.  Chronic atrial fibrillation.  Rate remained well controlled, continue anticoagulation with warfarin.  Discharge INR 2.5, follow-up INR November 25, target to 2- 3.   4.  Hypertension.  Pressure remained well controlled off antihypertensives.  5.  Protein calorie malnutrition, unspecified.  Patient was placed on nutritional supplements.   6.  Type 2 diabetes mellitus.  She was placed on insulin sliding scale for glucose coverage monitoring, she did had episodes of glycemia, at discharge will resume basal insulin with 8 units.  Discharge Diagnoses:  Principal Problem:   Gangrene (HCC) Active Problems:   ESRF (end stage renal failure) (HCC)   HTN (hypertension), benign   Diabetes mellitus (HCC)   Right  foot ulcer, with necrosis  of muscle (HCC)   Atrial fibrillation (HCC)   Essential hypertension   Mixed hyperlipidemia   PAD (peripheral artery disease) (HCC)   Long term (current) use of anticoagulants [Z79.01]    Discharge Instructions  Discharge Instructions    Negative Pressure Wound Therapy - Incisional   Complete by:  As directed    prevena vac for 1 week     Allergies as of 06/03/2018   No Known Allergies     Medication List    STOP taking these medications   levofloxacin 500 MG tablet Commonly known as:  LEVAQUIN   traMADol 50 MG tablet Commonly known as:  ULTRAM     TAKE these medications   acetaminophen 325 MG tablet Commonly known as:  TYLENOL Take 2 tablets (650 mg total) by mouth every 6 (six) hours as needed for moderate pain, fever or headache.   albuterol 108 (90 Base) MCG/ACT inhaler Commonly known as:  PROVENTIL HFA;VENTOLIN HFA Inhale 1-2 puffs into the lungs every 6 (six) hours as needed for wheezing.   atorvastatin 40 MG tablet Commonly known as:  LIPITOR Take 1 tablet (40 mg total) by mouth daily at 6 PM.   b complex-vitamin c-folic acid 0.8 MG Tabs tablet Take 1 tablet by mouth at bedtime.   calcium acetate 667 MG capsule Commonly known as:  PHOSLO Take 2,001 mg by mouth 3 (three) times daily with meals.   cinacalcet 30 MG tablet Commonly known as:  SENSIPAR Take 30 mg by mouth daily.   feeding supplement (NEPRO CARB STEADY) Liqd Take 237 mLs by mouth 2 (two) times daily between meals.   guaiFENesin 100 MG/5ML Soln Commonly known as:  ROBITUSSIN Take 15 mLs by mouth every 4 (four) hours as needed for cough or to loosen phlegm.   insulin detemir 100 UNIT/ML injection Commonly known as:  LEVEMIR Inject 0.08 mLs (8 Units total) into the skin at bedtime.   lidocaine-prilocaine cream Commonly known as:  EMLA Apply 1 application topically daily as needed (prior to dialysis).   oxyCODONE 5 MG immediate release tablet Commonly known as:  Oxy  IR/ROXICODONE Take 1 tablet (5 mg total) by mouth every 6 (six) hours as needed for severe pain.   promethazine 12.5 MG tablet Commonly known as:  PHENERGAN Take 12.5 mg by mouth daily as needed for nausea or vomiting.   sevelamer carbonate 2.4 g Pack Commonly known as:  RENVELA Take 2.4 g by mouth 3 (three) times daily with meals.   warfarin 5 MG tablet Commonly known as:  COUMADIN Take as directed. If you are unsure how to take this medication, talk to your nurse or doctor. Original instructions:  TAKE 1 to 1.5 TABLETS BY MOUTH DAILY AS DIRECTED BY THE COUMADIN CLINIC. What changed:    how much to take  how to take this  when to take this  additional instructions       Contact information for follow-up providers    Nadara Mustard, MD In 1 week.   Specialty:  Orthopedic Surgery Contact information: [address] [place] [address] [phone]            Contact information for after-discharge care    Destination    [facility] .   Service:  Skilled Nursing Contact information: [address] [place] [address] [phone]                 No Known Allergies  Consultations:  Nephrology   Orthopedics  Cardiology    Procedures/Studies: Dg Chest 2 View  Result Date: 06/04/2018 CLINICAL DATA:  right leg pain onset last night, also reports new blisters on right foot, reports a "couple falls last night when moving from the wheelchair to the bed." dialysis patient, on T/Th/S, did not receive treatment today. hx ESRD, htn, BKA left side. Hx DM. Pt also reports right sided hip pain. EXAM: CHEST - 2 VIEW COMPARISON:  06/02/2018 FINDINGS: Airspace opacities at the left lung base obscuring the left diaphragmatic leaflet. Mild interstitial prominence diffusely. Marked cardiomegaly.  Aortic Atherosclerosis (ICD10-170.0). No significant effusion. Visualized bones unremarkable. Vascular stents in the right  subclavian and axillary region as before. IMPRESSION: 1.  Persistent airspace opacities at the left lung base. 2. Stable cardiomegaly. Electronically Signed   By: Corlis Leak M.D.   On: 06/04/2018 12:17   Dg Chest 2 View  Result Date: 06/02/2018 CLINICAL DATA:  Syncope EXAM: CHEST - 2 VIEW COMPARISON:  05/21/2018 FINDINGS: Marked cardiac enlargement.  Mild vascular congestion without edema. Left lower lobe consolidation similar to the prior study compatible with atelectasis/infiltrate and small effusion. Mild right lower lobe airspace disease unchanged Right axillary vascular stent unchanged. IMPRESSION: Cardiac enlargement without edema. Bibasilar airspace disease left greater than right is unchanged. Electronically Signed   By: Marlan Palau M.D.   On: 06/02/2018 19:04   Dg Chest 2 View  Result Date: 05/21/2018 CLINICAL DATA:  Syncopal episode following HD treatment today EXAM: CHEST - 2 VIEW COMPARISON:  03/30/2018 FINDINGS: Cardiac shadow is enlarged but stable. The lungs are well aerated bilaterally. Stable fibrotic changes are noted in the bases. Extensive stenting is noted on the right. No bony abnormality is seen. IMPRESSION: Chronic changes without acute abnormality. Electronically Signed   By: Alcide Clever M.D.   On: 05/21/2018 17:19   Dg Pelvis 1-2 Views  Result Date: 06/04/2018 CLINICAL DATA:  right leg pain onset last night, also reports new blisters on right foot, reports a "couple falls last night when moving from the wheelchair to the bed." dialysis patient, on T/Th/S, did not receive treatment today. hx ESRD, htn, BKA left side. Hx DM. Pt also reports right sided hip pain. EXAM: PELVIS - 1-2 VIEW COMPARISON:  CT 04/20/2013 and previous FINDINGS: There is no evidence of pelvic fracture or diastasis. No pelvic bone lesions are seen. Extensive iliofemoral and pelvic arterial calcifications. IMPRESSION: No acute findings. Electronically Signed   By: Corlis Leak M.D.   On: 06/04/2018 12:18    Ct Angio Ao+bifem W & Or Wo Contrast  Result Date: 06/05/2018 CLINICAL DATA:  83 year old with abnormal right foot. Gas gangrene. Previous arteriogram demonstrated areas of stenosis in the right SFA that were treated with arthrectomy and balloon angioplasty. Known occlusion of the right runoff vessels. EXAM: CT ANGIOGRAPHY OF ABDOMINAL AORTA WITH ILIOFEMORAL RUNOFF TECHNIQUE: Multidetector CT imaging of the abdomen, pelvis and lower extremities was performed using the standard protocol during bolus administration of intravenous contrast. Multiplanar CT image reconstructions and MIPs were obtained to evaluate the vascular anatomy. CONTRAST:  ISOVUE-370 IOPAMIDOL (ISOVUE-370) INJECTION 76% COMPARISON:  Arteriogram report from 12/01/2017 FINDINGS: VASCULAR Aorta: Diffuse atherosclerotic disease in the abdominal aorta without aneurysm or dissection. Many of the images are limited due to motion artifact. Celiac: Mild stenosis in the proximal celiac trunk measuring 40-50%. Celiac trunk branches are heavily calcified. SMA: Patent without evidence of aneurysm, dissection, vasculitis or significant stenosis. Renals: Mixed plaque at the origin of  the right renal artery causing mild-to-moderate stenosis. Large amount of motion artifact associated the renal arteries. No significant stenosis in the proximal left renal artery. IMA: IMA is heavily calcified but appears to be patent. RIGHT Lower Extremity Inflow: Right iliac arteries are heavily calcified. Common, internal and external iliac arteries are patent. No significant stenosis in the right common and right external iliac artery. Outflow: Right common femoral artery is widely patent with mild disease. Profunda femoral arteries are patent. The right SFA is heavily calcified. Proximal right SFA is patent. Focal high-grade stenosis in the mid right SFA on sequence 6, image 290. There is another focus of critical stenosis or occlusion in the distal right SFA on  image 308. Right popliteal artery is diffusely diseased with segmental areas of stenosis. Runoff: The runoff vessels are heavily calcified and difficult to evaluate lumen patency. Right runoff vessels are presumed to be occluded based on the previous angiogram report. Limited evaluation of the arteries at the ankle due to vessel calcifications. LEFT Lower Extremity Inflow: Left iliac arteries are heavily calcified but patent. No significant stenosis in the left common or external iliac artery. Outflow: Atherosclerotic calcifications in left common femoral artery without significant stenosis. Left profunda femoral arteries are patent. Left SFA is heavily calcified and there is occlusion of the mid and distal SFA. Left popliteal artery appears to be occluded. Runoff: Below the knee amputation. Runoff vessels are heavily calcified and appear to be occluded. Veins: No obvious venous abnormality within the limitations of this arterial phase study. Review of the MIP images confirms the above findings. NON-VASCULAR Lower chest: Calcified granuloma at the right lung base. Small left pleural effusion. Heart is enlarged. Contrast refluxes into the hepatic veins and compatible with increased right heart pressures. Hepatobiliary: Limited evaluation due to motion artifact. Gallbladder is mildly distended with high-density material compatible with stones. No gross abnormality to the liver. Pancreas: Poorly characterized without gross abnormality. Spleen: Normal in size without focal abnormality. Adrenals/Urinary Tract: Mild fullness in left adrenal gland. Excessive motion artifact in the kidneys. Negative for hydronephrosis. Urinary bladder is unremarkable. Stomach/Bowel: Stomach and bowel structures are unremarkable without dilatation. Excessive motion limits evaluation for inflammatory changes. Lymphatic: No significant lymph node enlargement in the abdomen or pelvis. Reproductive: Extensive vascularity around the uterus and  difficult to exclude uterine calcifications. Limited evaluation of the uterus and adnexal structures on this examination. Other: Small amount of free fluid in the pelvis. Negative for free air. Musculoskeletal: Left below the knee amputation. No significant bone abnormalities in the right lower extremity. Diffuse osteopenia. IMPRESSION: VASCULAR 1. Right lower extremity outflow disease with multifocal stenosis in the right SFA. At least 2 critical areas of stenosis in the right SFA. 2. Limited evaluation of the runoff vessels due to severe calcifications. However, the right runoff vessels appear to be occluded and this corresponds with the previous arteriogram report. 3. Left outflow disease demonstrated by occlusion of the left mid and distal SFA and left popliteal artery. 4. Aorta and iliac arteries are heavily calcified without significant inflow stenosis. 5. Stenosis in the right renal artery. Mild stenosis involving the celiac trunk. NON-VASCULAR 1. Limited evaluation of the intra-abdominal structures due to excessive motion artifact. 2. Small amount of free fluid in the pelvis is nonspecific. 3. Small left pleural effusion. 4. Cholelithiasis. Electronically Signed   By: Richarda Overlie M.D.   On: 06/05/2018 08:33   Dg Foot Complete Right  Result Date: 06/04/2018 CLINICAL DATA:  right leg pain onset last  night, also reports new blisters on right foot, reports a "couple falls last night when moving from the wheelchair to the bed." dialysis patient, on T/Th/S, did not receive treatment today. hx ESRD, htn, BKA left side. Hx DM. Pt also reports right sided hip pain. EXAM: RIGHT FOOT COMPLETE - 3+ VIEW COMPARISON:  11/09/2017 FINDINGS: Diffuse osteopenia. Negative for fracture or dislocation. Extensive vascular calcifications. No radiodense foreign body. No subcutaneous gas. IMPRESSION: Osteopenia without acute finding. Electronically Signed   By: Corlis Leak  Hassell M.D.   On: 06/04/2018 12:15        Subjective: Patient is feeling well, no chest pain, no dyspnea, no nausea or vomiting, right lower extremity pain is controlled with analgesics.   Discharge Exam: Vitals:   06/06/2018 0313 05/31/2018 0631  BP: (!) 100/58 (!) 112/52  Pulse: (!) 116 (!) 117  Resp: (!) 28 15  Temp: (!) 97.3 F (36.3 C) 97.8 F (36.6 C)  SpO2: 100% 100%   Vitals:   06/10/18 1934 05/29/2018 0002 06/10/2018 0313 06/01/2018 0631  BP: (!) 90/49 (!) 103/55 (!) 100/58 (!) 112/52  Pulse: (!) 129 (!) 119 (!) 116 (!) 117  Resp: 16 20 (!) 28 15  Temp: 97.6 F (36.4 C) 98 F (36.7 C) (!) 97.3 F (36.3 C) 97.8 F (36.6 C)  TempSrc: Oral Oral Oral Oral  SpO2: 95% 100% 100% 100%  Weight:    58.5 kg  Height:        General: Not in pain or dyspnea  Neurology: Awake and alert, non focal  E ENT: no pallor, no icterus, oral mucosa moist Cardiovascular: No JVD. S1-S2 present, rhythmic, no gallops or murmurs. Mild rub. No lower extremity edema. Pulmonary: positive breath sounds bilaterally, adequate air movement, no wheezing, rhonchi or rales. Gastrointestinal. Abdomen with, no organomegaly, non tender, no rebound or guarding Skin. No rashes Musculoskeletal: Bilateral BKA, right with wound vac in place.    The results of significant diagnostics from this hospitalization (including imaging, microbiology, ancillary and laboratory) are listed below for reference.     Microbiology: Recent Results (from the past 240 hour(s))  Culture, blood (Routine X 2) w Reflex to ID Panel     Status: None   Collection Time: 06/04/18 10:55 AM  Result Value Ref Range Status   Specimen Description BLOOD RIGHT FOREARM  Final   Special Requests   Final    BOTTLES DRAWN AEROBIC AND ANAEROBIC Blood Culture adequate volume   Culture   Final    NO GROWTH 5 DAYS Performed at [facility], [address] [place], [place] [address]    Report Status 06/09/2018 FINAL  Final  Culture, blood (Routine X 2) w Reflex to ID Panel      Status: None   Collection Time: 06/04/18 11:15 AM  Result Value Ref Range Status   Specimen Description BLOOD RIGHT HAND  Final   Special Requests   Final    BOTTLES DRAWN AEROBIC AND ANAEROBIC Blood Culture adequate volume   Culture   Final    NO GROWTH 5 DAYS Performed at [facility], [address] [place], [place] [address]    Report Status 06/09/2018 FINAL  Final  MRSA PCR Screening     Status: None   Collection Time: 06/04/18  7:05 PM  Result Value Ref Range Status   MRSA by PCR NEGATIVE NEGATIVE Final    Comment:        The GeneXpert MRSA Assay (FDA approved for NASAL specimens  only), is one component of a comprehensive MRSA colonization surveillance program. It is not intended to diagnose MRSA infection nor to guide or monitor treatment for MRSA infections. Performed at [facility], [address], [place], [place] [address]   Surgical pcr screen     Status: None   Collection Time: 06/08/18  8:34 AM  Result Value Ref Range Status   MRSA, PCR NEGATIVE NEGATIVE Final   Staphylococcus aureus NEGATIVE NEGATIVE Final    Comment: (NOTE) The Xpert SA Assay (FDA approved for NASAL specimens in patients 37 years of age and older), is one component of a comprehensive surveillance program. It is not intended to diagnose infection nor to guide or monitor treatment. Performed at [facility], [address] [place], [place] [address]      Labs: BNP (last 3 results) No results for input(s): BNP in the last 8760 hours. Basic Metabolic Panel: Recent Labs  Lab 06/05/18 0258 06/06/18 0222 06/07/18 0518 06/08/18 1125 06/08/18 1204 06/08/18 1633 06/09/18 0855  NA 138 134* 136 133* 140 138 136  K 3.3* 3.9 3.7 6.7* 4.3 4.5 4.5  CL 100 97* 98  --   --  104 103  CO2 23 24 28   --   --  25 22  GLUCOSE 132* 161* 35* 78 83 159* 186*  BUN 37* 42* 21  --   --  18 24*  CREATININE 6.51* 7.55* 4.68*  --   --  4.26* 5.30*  CALCIUM 9.0 9.0 8.4*  --   --   8.3* 8.6*  MG  --  2.2  --   --   --   --   --   PHOS  --  5.3*  --   --   --   --   --    Liver Function Tests: Recent Labs  Lab 06/04/18 1055 06/06/18 0222  AST 28  --   ALT 17  --   ALKPHOS 70  --   BILITOT 1.3*  --   PROT 6.6  --   ALBUMIN 2.4* 2.1*   No results for input(s): LIPASE, AMYLASE in the last 168 hours. No results for input(s): AMMONIA in the last 168 hours. CBC: Recent Labs  Lab 06/04/18 1055 06/05/18 0258 06/06/18 0222 06/07/18 0506 06/08/18 0643 06/08/18 1125 06/08/18 1204 06/09/18 0855  WBC 8.5 8.3 9.4 10.7* 10.6*  --   --  8.0  NEUTROABS 7.3  --  7.9*  --   --   --   --  6.9  HGB 9.6* 8.8* 9.1* 9.2* 8.8* 9.9* 10.2* 8.6*  HCT 30.2* 28.0* 29.7* 30.2* 28.2* 29.0* 30.0* 28.9*  MCV 89.1 88.1 88.9 89.1 90.1  --   --  91.7  PLT 139* 137* 132* 136* 116*  --   --  151   Cardiac Enzymes: No results for input(s): CKTOTAL, CKMB, CKMBINDEX, TROPONINI in the last 168 hours. BNP: Invalid input(s): POCBNP CBG: Recent Labs  Lab 06/10/18 1737 06/10/18 2006 06/10/18 2338 2018-06-23 0036 2018-06-23 0346  GLUCAP 90 101* 65* 106* 75   D-Dimer No results for input(s): DDIMER in the last 72 hours. Hgb A1c No results for input(s): HGBA1C in the last 72 hours. Lipid Profile No results for input(s): CHOL, HDL, LDLCALC, TRIG, CHOLHDL, LDLDIRECT in the last 72 hours. Thyroid function studies No results for input(s): TSH, T4TOTAL, T3FREE, THYROIDAB in the last 72 hours.  Invalid input(s): FREET3 Anemia work up No results for input(s): VITAMINB12, FOLATE, FERRITIN,  TIBC, IRON, RETICCTPCT in the last 72 hours. Urinalysis    Component Value Date/Time   COLORURINE BROWN (A) 03/30/2018 1210   APPEARANCEUR TURBID (A) 03/30/2018 1210   LABSPEC 1.020 03/30/2018 1210   PHURINE 8.0 03/30/2018 1210   GLUCOSEU NEGATIVE 03/30/2018 1210   HGBUR LARGE (A) 03/30/2018 1210   BILIRUBINUR MODERATE (A) 03/30/2018 1210   KETONESUR NEGATIVE 03/30/2018 1210   PROTEINUR >300 (A)  03/30/2018 1210   UROBILINOGEN 0.2 01/08/2013 1318   NITRITE NEGATIVE 03/30/2018 1210   LEUKOCYTESUR MODERATE (A) 03/30/2018 1210   Sepsis Labs Invalid input(s): PROCALCITONIN,  WBC,  LACTICIDVEN Microbiology Recent Results (from the past 240 hour(s))  Culture, blood (Routine X 2) w Reflex to ID Panel     Status: None   Collection Time: 06/04/18 10:55 AM  Result Value Ref Range Status   Specimen Description BLOOD RIGHT FOREARM  Final   Special Requests   Final    BOTTLES DRAWN AEROBIC AND ANAEROBIC Blood Culture adequate volume   Culture   Final    NO GROWTH 5 DAYS Performed at [facility], [address] [place], [place] [address]    Report Status 06/09/2018 FINAL  Final  Culture, blood (Routine X 2) w Reflex to ID Panel     Status: None   Collection Time: 06/04/18 11:15 AM  Result Value Ref Range Status   Specimen Description BLOOD RIGHT HAND  Final   Special Requests   Final    BOTTLES DRAWN AEROBIC AND ANAEROBIC Blood Culture adequate volume   Culture   Final    NO GROWTH 5 DAYS Performed at [facility], [address] [place], [place] [address]    Report Status 06/09/2018 FINAL  Final  MRSA PCR Screening     Status: None   Collection Time: 06/04/18  7:05 PM  Result Value Ref Range Status   MRSA by PCR NEGATIVE NEGATIVE Final    Comment:        The GeneXpert MRSA Assay (FDA approved for NASAL specimens only), is one component of a comprehensive MRSA colonization surveillance program. It is not intended to diagnose MRSA infection nor to guide or monitor treatment for MRSA infections. Performed at [facility], [address], [place], [place] [address]   Surgical pcr screen     Status: None   Collection Time: 06/08/18  8:34 AM  Result Value Ref Range Status   MRSA, PCR NEGATIVE NEGATIVE Final   Staphylococcus aureus NEGATIVE NEGATIVE Final    Comment: (NOTE) The Xpert SA Assay (FDA approved for NASAL specimens in patients 58 years of age  and older), is one component of a comprehensive surveillance program. It is not intended to diagnose infection nor to guide or monitor treatment. Performed at [facility], [address], [place], [place] [address]      Time coordinating discharge: 45 minutes  SIGNED:   Coralie Keens, MD  [facility] 2018/06/21, 7:51 AM Pager [phone]  If 7PM-7AM, please contact night-coverage [email or web address] Password [email or web address]

## 2018-06-11 NOTE — Progress Notes (Signed)
Unable to draw labs from [facility].  CalledLab to draw am labs, however unable to obtain. Will have HD to draw am labs . Jackie [name], [name]

## 2018-06-11 NOTE — Progress Notes (Signed)
Pt cbg = 77 gave juice r/t pt's sugar drops.  Will continue to monitor. Karena Addisonoro, Ubah Radke T

## 2018-06-11 NOTE — Progress Notes (Signed)
[facility] Patient is going [facility] room 604P [facility].  SW spoke with Cherly HensenBernadette, RN to inform her of information. RN to call report at [phone] and ask for [facility].  Edwin Dadaarol Jaria Conway, MSW Medical Social Worker [place]

## 2018-06-11 NOTE — Progress Notes (Signed)
Md made aware of cbg 106. Hr 118-125 will continue to monitor. Karena Addisonoro, Lorine Iannaccone T

## 2018-06-11 NOTE — Progress Notes (Signed)
pts cbg = 67. Pt unable to take po due to nausea at this time.  Gave 1/2 amp d50 iv.  sbp 103 hr 118 -120's. Will recheck cbg and notify MD.  Alethia Bertholdoro, Kalieb Freeland T

## 2018-06-11 NOTE — Progress Notes (Addendum)
Jackie [name] Progress Note   Subjective:  Seen in HD unit. UF goal 3L. Tolerating HD. She's ready to go home.   Objective Vitals:   06/04/2018 0830 06/13/2018 0900 05/31/2018 0930 06/05/2018 1000  BP: 116/81 (!) 113/58 95/75 (!) 131/94  Pulse: 86 (!) 117 67 70  Resp:      Temp:      TempSrc:      SpO2:      Weight:      Height:       Physical Exam Gen: elderly woman, sitting in bed, eating CVS: irregular, pericardial rub -much improved, not heard this am.   SEM  Resp: clear bilaterally UJW:[id number]:nondistended nontender Ext: RLE s/p BKA and woundvac, some RUE edema ACCESS: LUE AVG cannulated on HD   Dialysis: [facility] TTS 4h  2/2.25 bath  55.5kg  Hep 1800  LUA AVG Calcitriol 2 mcg po/HD Mircera 50 mg q 2 wks (last 05/14/18)  Assessment/Plan 1. RLE gangrene: Completed course of Zosyn  S/p BKA 11/20 with Dr Lajoyce Cornersuda, has woundvac. 2. + Pericardial rub, improving- most likely d/t underdialysis, had  HD 11/18 11/19, 11/21, 11/23 and will add 15 min to time.  Cardiology has seen, appreciate assistance.  No other recs than intensified HD reg.  TTE with trivial effusion.  No tamponade 3. ESRD -HD TTS Schedule, Missed hd 11/16.  was dialyzed here more intensively for + pericarditis this week, improved/ resolved 4. Possible seizure activity: none seen here, syncopal episodes noted as OP, will monitor, consider neuro c/s for EEG vs referral for eval 5. Atrial Fibrillation -  Coumadin as OP 6. Hypertension/volume - bp and vol stable, no op bp meds  7. Anemia - hgb 9.6 ESA due 11/26 8. Metabolic bone disease -On sensipar 30 mg q d, phoslo 3 ac and Renvela listed as binders .po vit d on hd.  Will switch no non-Ca based binder 9. DM type 2 - per admit  10. Nutrition - alb 2.4 , renal carb mod diet ,nepro Supplement, renal vit 11. Dispo: For discharge to SNF today   [name] [name] PA-C [name] Pager [phone] 05/24/2018,10:24 AM  LOS: 7 days   Pt seen,  examined and agree w A/P as above.  Jackie Moselleob Tiajah Oyster MD [name] pager [phone]   05/24/2018, 1:29 PM    Additional Objective Labs: Basic Metabolic Panel: Recent Labs  Lab 06/06/18 0222  06/08/18 1633 06/09/18 0855 05/20/2018 0710  NA 134*   < > 138 136 135  K 3.9   < > 4.5 4.5 4.5  CL 97*   < > 104 103 101  CO2 24   < > 25 22 22   GLUCOSE 161*   < > 159* 186* 111*  BUN 42*   < > 18 24* 25*  CREATININE 7.55*   < > 4.26* 5.30* 5.45*  CALCIUM 9.0   < > 8.3* 8.6* 9.4  PHOS 5.3*  --   --   --   --    < > = values in this interval not displayed.   CBC: Recent Labs  Lab 06/04/18 1055  06/06/18 0222 06/07/18 0506 06/08/18 0643  06/08/18 1204 06/09/18 0855 06/08/2018 0949  WBC 8.5   < > 9.4 10.7* 10.6*  --   --  8.0 PENDING  NEUTROABS 7.3  --  7.9*  --   --   --   --  6.9  --   HGB 9.6*   < > 9.1* 9.2*  8.8*   < > 10.2* 8.6* 10.4*  HCT 30.2*   < > 29.7* 30.2* 28.2*   < > 30.0* 28.9* 33.0*  MCV 89.1   < > 88.9 89.1 90.1  --   --  91.7 91.7  PLT 139*   < > 132* 136* 116*  --   --  151 144*   < > = values in this interval not displayed.   Blood Culture    Component Value Date/Time   SDES BLOOD RIGHT HAND 06/04/2018 1115   SPECREQUEST  06/04/2018 1115    BOTTLES DRAWN AEROBIC AND ANAEROBIC Blood Culture adequate volume   CULT  06/04/2018 1115    NO GROWTH 5 DAYS Performed at [facility], [address] [place], [place] [address]    REPTSTATUS 06/09/2018 FINAL 06/04/2018 1115    Cardiac Enzymes: No results for input(s): CKTOTAL, CKMB, CKMBINDEX, TROPONINI in the last 168 hours. CBG: Recent Labs  Lab 06/10/18 1737 06/10/18 2006 06/10/18 2338 06/07/2018 0036 05/29/2018 0346  GLUCAP 90 101* 65* 106* 75   Iron Studies: No results for input(s): IRON, TIBC, TRANSFERRIN, FERRITIN in the last 72 hours. Lab Results  Component Value Date   INR 2.53 05/31/2018   INR 2.25 06/10/2018   INR 1.85 06/09/2018   Medications: . sodium chloride    .  sodium chloride    . sodium chloride 10 mL/hr at 06/08/18 1143  . sodium chloride 10 mL/hr at 06/08/18 1647  . methocarbamol (ROBAXIN) IV     . atorvastatin  40 mg Oral q1800  . calcitRIOL  2 mcg Oral Q T,Th,Sa-HD  . calcium acetate  2,001 mg Oral TID WC  . Chlorhexidine Gluconate Cloth  6 each Topical Q0600  . cinacalcet  30 mg Oral Q supper  . [START ON 06/14/2018] darbepoetin (ARANESP) injection - DIALYSIS  100 mcg Intravenous Q Tue-HD  . docusate sodium  100 mg Oral BID  . feeding supplement (NEPRO CARB STEADY)  237 mL Oral BID BM  . heparin  1,800 Units Dialysis Once in dialysis  . multivitamin  1 tablet Oral QHS  . sevelamer carbonate  2.4 g Oral TID WC

## 2018-06-11 NOTE — Progress Notes (Signed)
[facility]. RN aware that Pt received Zofran and Oxycodone @ noon. RN will [facility]'s glucose [facility].

## 2018-06-11 NOTE — Plan of Care (Signed)
  Problem: Health Behavior/Discharge Planning: Goal: Ability to manage health-related needs will improve Outcome: Adequate for Discharge   Problem: Clinical Measurements: Goal: Ability to maintain clinical measurements within normal limits will improve Outcome: Adequate for Discharge Goal: Will remain free from infection Outcome: Adequate for Discharge Goal: Diagnostic test results will improve Outcome: Adequate for Discharge Goal: Respiratory complications will improve Outcome: Adequate for Discharge Goal: Cardiovascular complication will be avoided Outcome: Adequate for Discharge   Problem: Activity: Goal: Risk for activity intolerance will decrease Outcome: Adequate for Discharge   Problem: Elimination: Goal: Will not experience complications related to bowel motility Outcome: Adequate for Discharge Goal: Will not experience complications related to urinary retention Outcome: Adequate for Discharge   Problem: Skin Integrity: Goal: Risk for impaired skin integrity will decrease Outcome: Adequate for Discharge   

## 2018-06-13 ENCOUNTER — Telehealth (INDEPENDENT_AMBULATORY_CARE_PROVIDER_SITE_OTHER): Payer: Self-pay

## 2018-06-13 DIAGNOSIS — Z9181 History of falling: Secondary | ICD-10-CM | POA: Diagnosis not present

## 2018-06-13 DIAGNOSIS — R2689 Other abnormalities of gait and mobility: Secondary | ICD-10-CM | POA: Diagnosis not present

## 2018-06-13 DIAGNOSIS — I4891 Unspecified atrial fibrillation: Secondary | ICD-10-CM | POA: Diagnosis not present

## 2018-06-13 DIAGNOSIS — I96 Gangrene, not elsewhere classified: Secondary | ICD-10-CM | POA: Diagnosis not present

## 2018-06-13 DIAGNOSIS — Z89511 Acquired absence of right leg below knee: Secondary | ICD-10-CM | POA: Diagnosis not present

## 2018-06-13 DIAGNOSIS — M79661 Pain in right lower leg: Secondary | ICD-10-CM | POA: Diagnosis not present

## 2018-06-13 DIAGNOSIS — N186 End stage renal disease: Secondary | ICD-10-CM | POA: Diagnosis not present

## 2018-06-13 NOTE — Telephone Encounter (Signed)
Called to advise preveena disposable at d/c and then wear for a week and we will remove at office visit.

## 2018-06-13 NOTE — Telephone Encounter (Signed)
Samantha case manager at [facility] would like clarification on wound vac that patient was discharged with.  Cb# is [phone].  Please advise.  Thank you.

## 2018-06-14 ENCOUNTER — Telehealth (INDEPENDENT_AMBULATORY_CARE_PROVIDER_SITE_OTHER): Payer: Self-pay | Admitting: Orthopedic Surgery

## 2018-06-14 DIAGNOSIS — Z992 Dependence on renal dialysis: Secondary | ICD-10-CM | POA: Diagnosis not present

## 2018-06-14 DIAGNOSIS — I871 Compression of vein: Secondary | ICD-10-CM | POA: Diagnosis not present

## 2018-06-14 DIAGNOSIS — E119 Type 2 diabetes mellitus without complications: Secondary | ICD-10-CM | POA: Diagnosis not present

## 2018-06-14 DIAGNOSIS — N186 End stage renal disease: Secondary | ICD-10-CM | POA: Diagnosis not present

## 2018-06-14 DIAGNOSIS — T82868A Thrombosis of vascular prosthetic devices, implants and grafts, initial encounter: Secondary | ICD-10-CM | POA: Diagnosis not present

## 2018-06-14 NOTE — Telephone Encounter (Signed)
I called and lm on vm to advise that the pt should leave her wound vac intact until first post op appt. She was d/c with a disposable vac make sure that it is charged and to call the office to make an appt for the pt to come in the office next week to see Dr. Lajoyce Cornersuda or Ines BloomerShawn. To call with questions.

## 2018-06-14 NOTE — Telephone Encounter (Signed)
[facility]  [phone]  Sophronia SimasKenisha    Jackie [name] called has questions about the Wound vac

## 2018-06-15 DIAGNOSIS — N186 End stage renal disease: Secondary | ICD-10-CM | POA: Diagnosis not present

## 2018-06-15 DIAGNOSIS — N2581 Secondary hyperparathyroidism of renal origin: Secondary | ICD-10-CM | POA: Diagnosis not present

## 2018-06-15 DIAGNOSIS — D509 Iron deficiency anemia, unspecified: Secondary | ICD-10-CM | POA: Diagnosis not present

## 2018-06-15 DIAGNOSIS — E1129 Type 2 diabetes mellitus with other diabetic kidney complication: Secondary | ICD-10-CM | POA: Diagnosis not present

## 2018-06-19 DIAGNOSIS — 419620001 Death: Secondary | SNOMED CT | POA: Diagnosis not present

## 2018-06-19 DEATH — deceased

## 2018-06-21 ENCOUNTER — Telehealth: Payer: Self-pay

## 2018-06-21 ENCOUNTER — Ambulatory Visit: Payer: Self-pay

## 2018-06-21 NOTE — Telephone Encounter (Signed)
Pt deceased spoke to a family member

## 2018-10-11 IMAGING — CT CT HEAD W/O CM
4 series · 17 of 47 positions shown, 19 images · non-contrast
Comparison: 05/21/2010

CLINICAL DATA: Syncopal episode during dialysis.

EXAM:
CT HEAD WITHOUT CONTRAST
TECHNIQUE: Contiguous axial images were obtained from the base of the skull
through the vertex without intravenous contrast.

[Series 3: head without · axial · non-contrast · 0.46mm/px · z∈[-137,-17]mm · 7 of 33 slices shown, 9 images]
[im 5/33  brain]
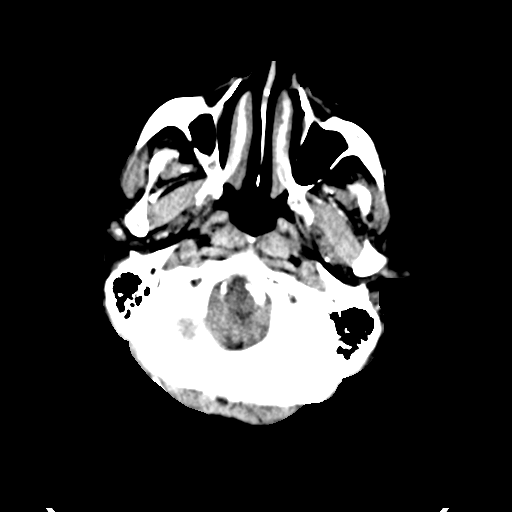
[im 5/33  bone]
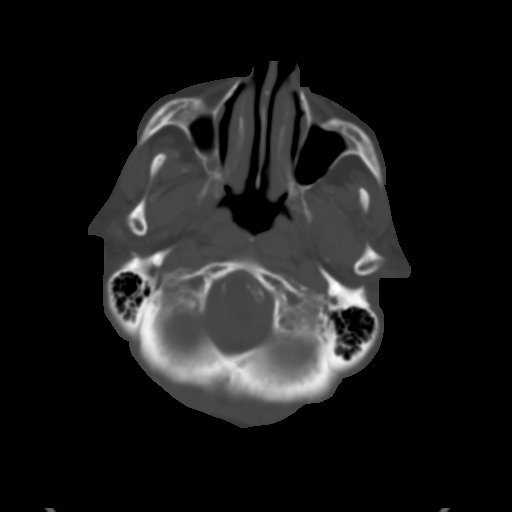
[im 9/33  brain]
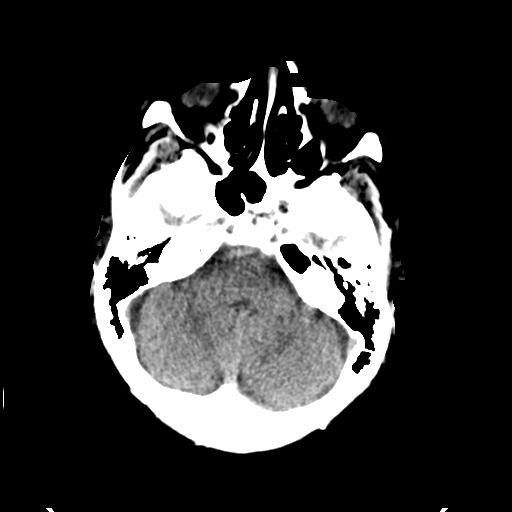
[im 13/33  brain]
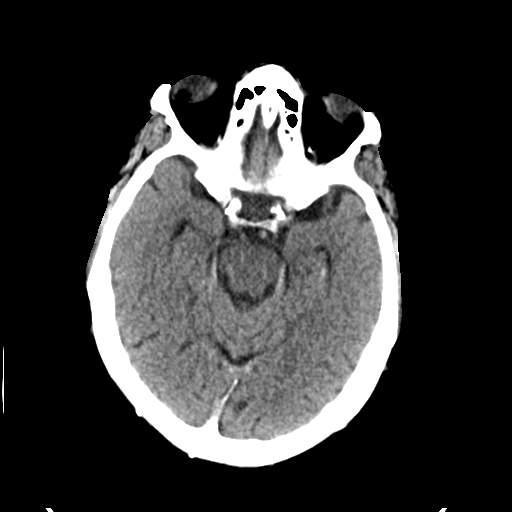
[im 17/33  brain]
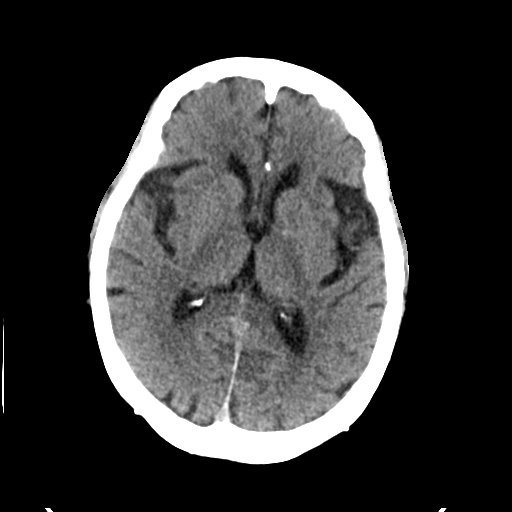
[im 21/33  brain]
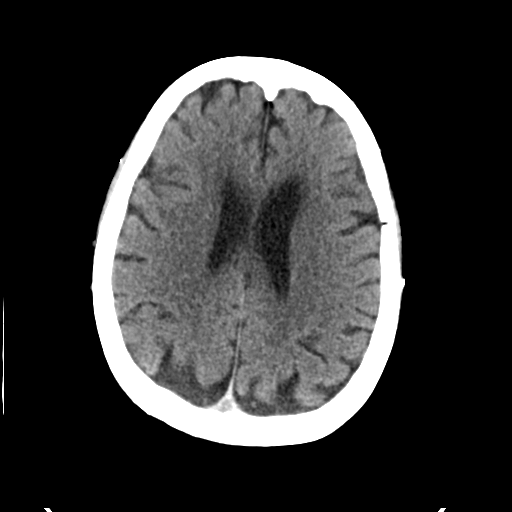
[im 21/33  bone]
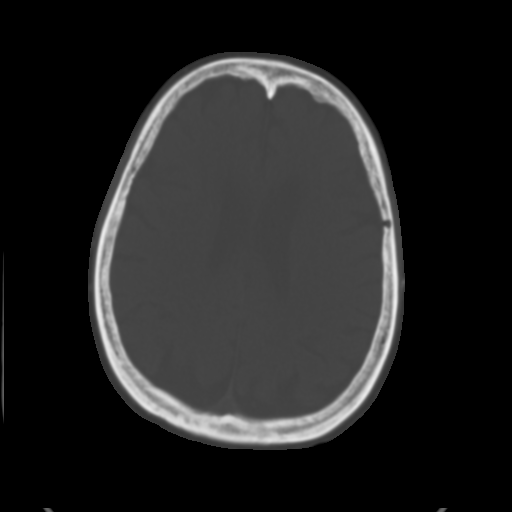
[im 25/33  brain]
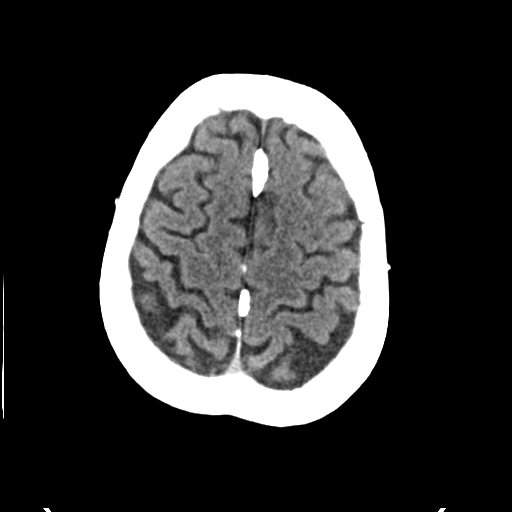
[im 29/33  brain]
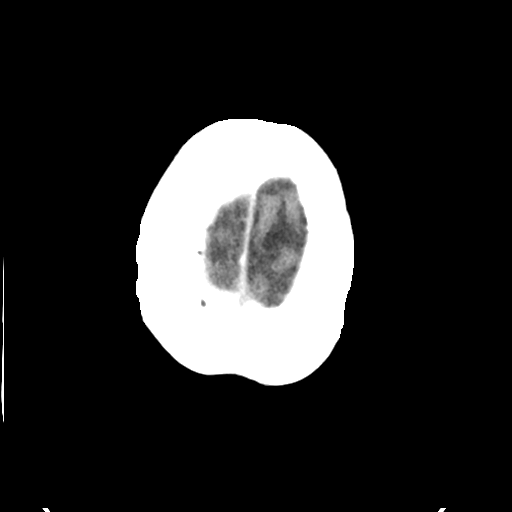

[Series 4: head bone · axial · 0.46mm/px · z∈[-141,-85]mm · 4 of 82 slices shown]
[im 9/82  bone]
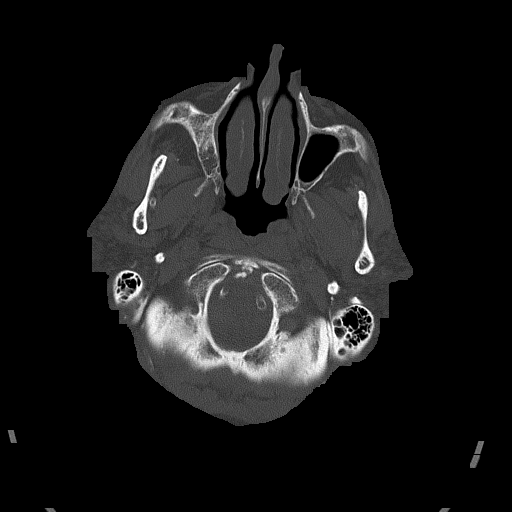
[im 17/82  bone]
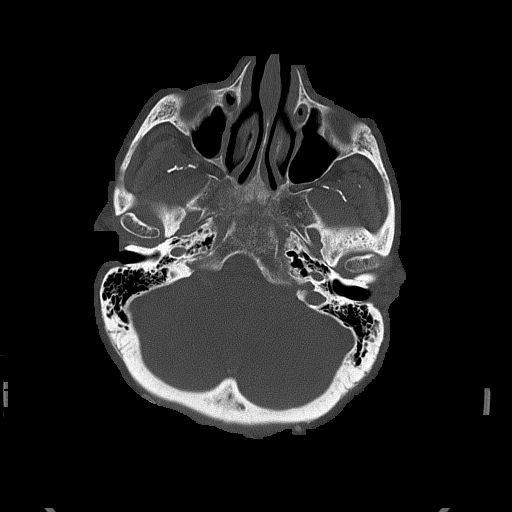
[im 25/82  bone]
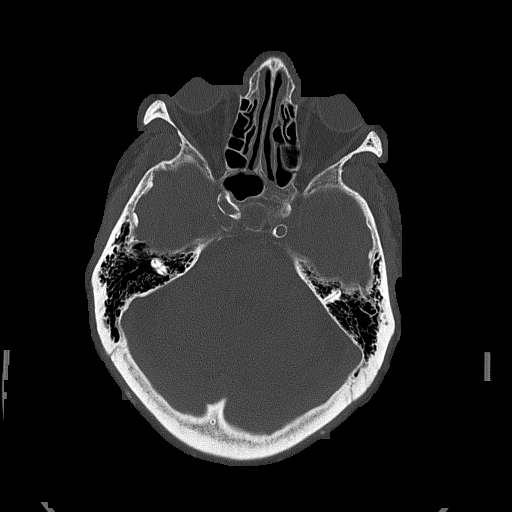
[im 37/82  bone]
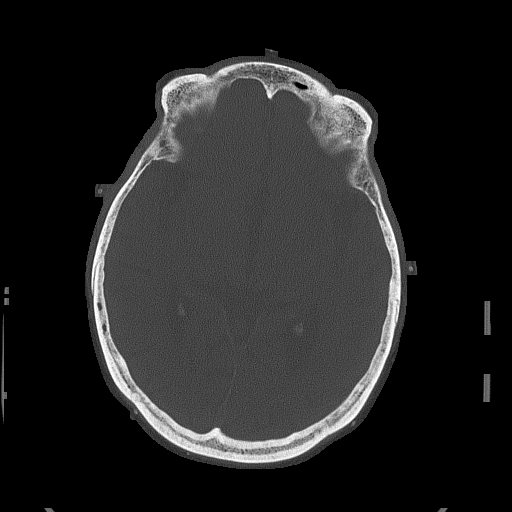

[Series 5: head without cor · coronal · non-contrast · 0.35mm/px · 3 of 69 slices shown]
[im 23/69  brain]
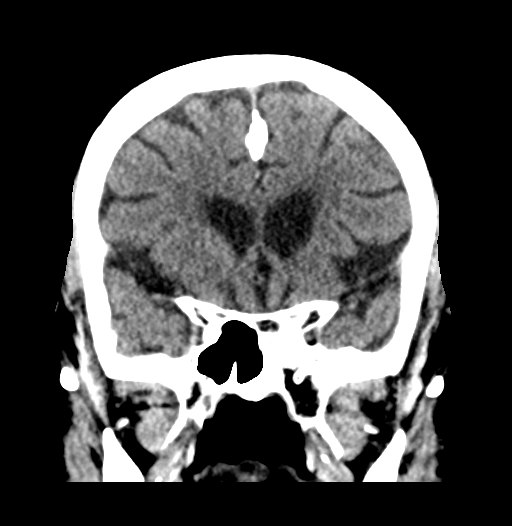
[im 31/69  brain]
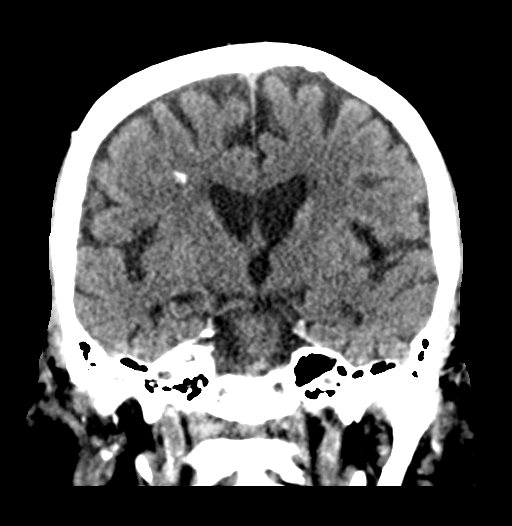
[im 38/69  brain]
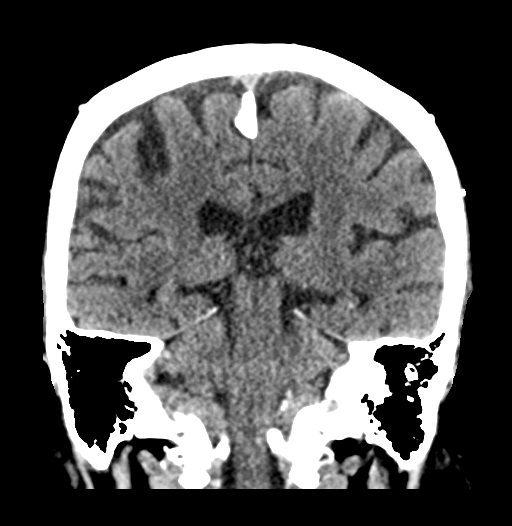

[Series 6: head without sag · sagittal · non-contrast · 0.34mm/px · 3 of 55 slices shown]
[im 19/55  brain]
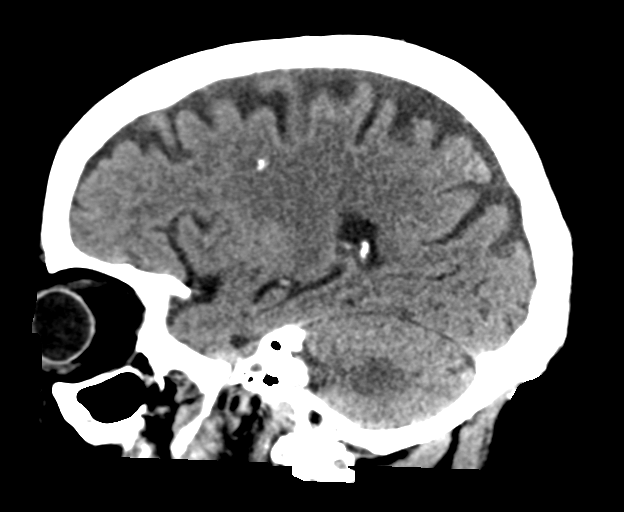
[im 28/55  brain]
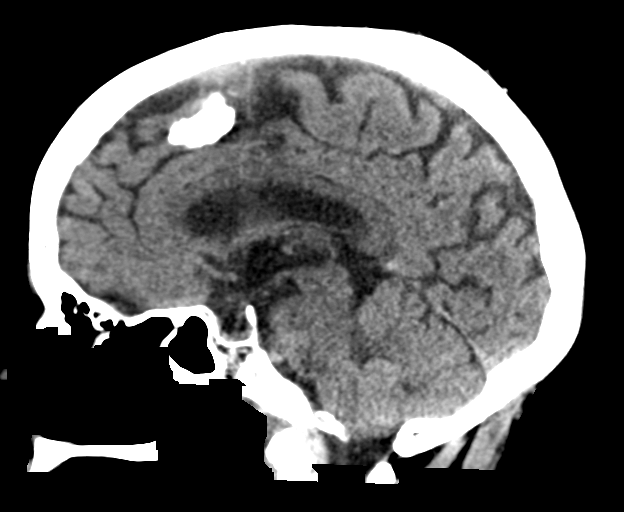
[im 37/55  brain]
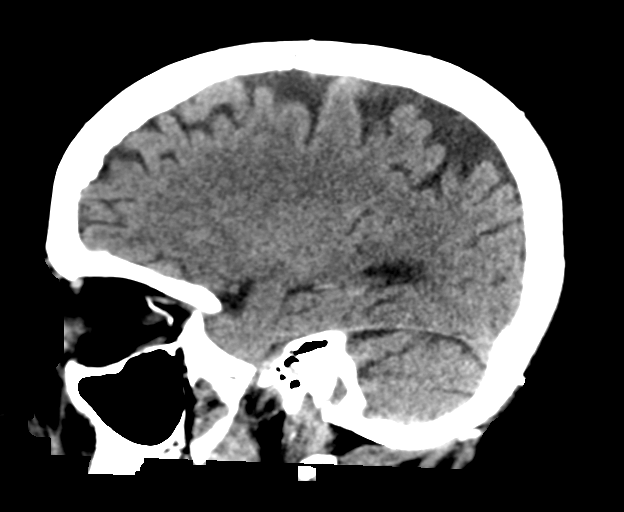

[17 of 47 positions shown; findings below may reference images not displayed]

FINDINGS: Brain: No evidence of acute infarction, hemorrhage, hydrocephalus,
extra-axial collection or mass lesion/mass effect. Moderate brain
parenchymal volume loss and deep white matter microangiopathy.

Vascular: Calcific atherosclerotic disease of the intra cavernous
carotid arteries.

Skull: Normal. Negative for fracture or focal lesion.

Sinuses/Orbits: No acute finding.

Other: None.
IMPRESSION: No acute intracranial abnormality.

Atrophy, chronic microvascular disease.

## 2018-10-17 ENCOUNTER — Ambulatory Visit: Payer: Medicare Other | Admitting: Surgery

## 2018-10-17 ENCOUNTER — Other Ambulatory Visit (HOSPITAL_COMMUNITY): Payer: Medicare Other
